# Patient Record
Sex: Female | Born: 1960
Health system: Southern US, Community
[De-identification: ages and names within clinical notes are randomized; demographics above are authoritative.]

## PROBLEM LIST (undated history)

## (undated) ENCOUNTER — Ambulatory Visit: Admission: EM | Payer: Medicare HMO

## (undated) DIAGNOSIS — I10 Essential (primary) hypertension: Secondary | ICD-10-CM

## (undated) DIAGNOSIS — E119 Type 2 diabetes mellitus without complications: Secondary | ICD-10-CM

## (undated) DIAGNOSIS — G20A1 Parkinson's disease without dyskinesia, without mention of fluctuations: Secondary | ICD-10-CM

## (undated) DIAGNOSIS — F4310 Post-traumatic stress disorder, unspecified: Secondary | ICD-10-CM

## (undated) DIAGNOSIS — M51369 Other intervertebral disc degeneration, lumbar region without mention of lumbar back pain or lower extremity pain: Secondary | ICD-10-CM

## (undated) DIAGNOSIS — D170 Benign lipomatous neoplasm of skin and subcutaneous tissue of head, face and neck: Secondary | ICD-10-CM

## (undated) DIAGNOSIS — G4733 Obstructive sleep apnea (adult) (pediatric): Secondary | ICD-10-CM

## (undated) DIAGNOSIS — F32A Depression, unspecified: Secondary | ICD-10-CM

## (undated) DIAGNOSIS — M791 Myalgia, unspecified site: Secondary | ICD-10-CM

## (undated) DIAGNOSIS — F419 Anxiety disorder, unspecified: Secondary | ICD-10-CM

## (undated) DIAGNOSIS — J189 Pneumonia, unspecified organism: Secondary | ICD-10-CM

## (undated) DIAGNOSIS — E669 Obesity, unspecified: Secondary | ICD-10-CM

## (undated) DIAGNOSIS — I639 Cerebral infarction, unspecified: Secondary | ICD-10-CM

## (undated) DIAGNOSIS — Z8489 Family history of other specified conditions: Secondary | ICD-10-CM

## (undated) DIAGNOSIS — F329 Major depressive disorder, single episode, unspecified: Secondary | ICD-10-CM

## (undated) DIAGNOSIS — M1990 Unspecified osteoarthritis, unspecified site: Secondary | ICD-10-CM

## (undated) DIAGNOSIS — G20 Parkinson's disease: Secondary | ICD-10-CM

## (undated) DIAGNOSIS — G4752 REM sleep behavior disorder: Secondary | ICD-10-CM

## (undated) DIAGNOSIS — S069X1A Unspecified intracranial injury with loss of consciousness of 30 minutes or less, initial encounter: Secondary | ICD-10-CM

## (undated) DIAGNOSIS — S069X9A Unspecified intracranial injury with loss of consciousness of unspecified duration, initial encounter: Secondary | ICD-10-CM

## (undated) DIAGNOSIS — E785 Hyperlipidemia, unspecified: Secondary | ICD-10-CM

## (undated) DIAGNOSIS — K760 Fatty (change of) liver, not elsewhere classified: Secondary | ICD-10-CM

## (undated) DIAGNOSIS — G5793 Unspecified mononeuropathy of bilateral lower limbs: Secondary | ICD-10-CM

## (undated) DIAGNOSIS — A0472 Enterocolitis due to Clostridium difficile, not specified as recurrent: Secondary | ICD-10-CM

## (undated) DIAGNOSIS — I700 Atherosclerosis of aorta: Secondary | ICD-10-CM

## (undated) DIAGNOSIS — Z720 Tobacco use: Secondary | ICD-10-CM

## (undated) DIAGNOSIS — Z794 Long term (current) use of insulin: Secondary | ICD-10-CM

## (undated) DIAGNOSIS — J45909 Unspecified asthma, uncomplicated: Secondary | ICD-10-CM

## (undated) DIAGNOSIS — J302 Other seasonal allergic rhinitis: Secondary | ICD-10-CM

## (undated) DIAGNOSIS — R569 Unspecified convulsions: Secondary | ICD-10-CM

## (undated) DIAGNOSIS — F319 Bipolar disorder, unspecified: Secondary | ICD-10-CM

## (undated) DIAGNOSIS — Z7982 Long term (current) use of aspirin: Secondary | ICD-10-CM

## (undated) DIAGNOSIS — I2510 Atherosclerotic heart disease of native coronary artery without angina pectoris: Secondary | ICD-10-CM

## (undated) DIAGNOSIS — G4700 Insomnia, unspecified: Secondary | ICD-10-CM

## (undated) DIAGNOSIS — M5136 Other intervertebral disc degeneration, lumbar region: Secondary | ICD-10-CM

## (undated) HISTORY — PX: BREAST BIOPSY: SHX20

## (undated) HISTORY — DX: Obesity, unspecified: E66.9

## (undated) HISTORY — DX: Myalgia, unspecified site: M79.10

## (undated) HISTORY — DX: Unspecified convulsions: R56.9

## (undated) HISTORY — DX: Parkinson's disease without dyskinesia, without mention of fluctuations: G20.A1

## (undated) HISTORY — DX: Enterocolitis due to Clostridium difficile, not specified as recurrent: A04.72

## (undated) HISTORY — PX: BACK SURGERY: SHX140

## (undated) HISTORY — DX: Hyperlipidemia, unspecified: E78.5

## (undated) HISTORY — DX: Other intervertebral disc degeneration, lumbar region: M51.36

## (undated) HISTORY — DX: Type 2 diabetes mellitus without complications: E11.9

## (undated) HISTORY — DX: Parkinson's disease: G20

## (undated) HISTORY — DX: Tobacco use: Z72.0

---

## 2003-12-08 DIAGNOSIS — G459 Transient cerebral ischemic attack, unspecified: Secondary | ICD-10-CM

## 2003-12-08 HISTORY — DX: Transient cerebral ischemic attack, unspecified: G45.9

## 2010-12-07 HISTORY — PX: ABDOMINAL HYSTERECTOMY: SHX81

## 2011-01-02 DIAGNOSIS — K859 Acute pancreatitis without necrosis or infection, unspecified: Secondary | ICD-10-CM | POA: Insufficient documentation

## 2011-07-02 DIAGNOSIS — R300 Dysuria: Secondary | ICD-10-CM | POA: Insufficient documentation

## 2011-12-08 HISTORY — PX: CHOLECYSTECTOMY: SHX55

## 2013-09-05 ENCOUNTER — Emergency Department: Payer: Self-pay | Admitting: Emergency Medicine

## 2013-09-05 LAB — URINALYSIS, COMPLETE
Bacteria: NONE SEEN
Glucose,UR: NEGATIVE mg/dL (ref 0–75)
Ketone: NEGATIVE
Leukocyte Esterase: NEGATIVE
Nitrite: NEGATIVE
Ph: 6 (ref 4.5–8.0)
RBC,UR: <1 /HPF (ref 0–5)
Specific Gravity: 1.01 (ref 1.003–1.030)
WBC UR: NONE SEEN /HPF (ref 0–5)

## 2013-09-05 LAB — BASIC METABOLIC PANEL
BUN: 10 mg/dL (ref 7–18)
Creatinine: 0.79 mg/dL (ref 0.60–1.30)
EGFR (African American): >60
EGFR (Non-African Amer.): >60
Glucose: 106 mg/dL — ABNORMAL HIGH (ref 65–99)
Potassium: 4 mmol/L (ref 3.5–5.1)

## 2013-09-05 LAB — CBC
HCT: 41.4 % (ref 35.0–47.0)
HGB: 14.4 g/dL (ref 12.0–16.0)
MCHC: 34.8 g/dL (ref 32.0–36.0)
MCV: 93 fL (ref 80–100)
RBC: 4.45 10*6/uL (ref 3.80–5.20)
WBC: 7.9 10*3/uL (ref 3.6–11.0)

## 2013-09-05 LAB — PRO B NATRIURETIC PEPTIDE: B-Type Natriuretic Peptide: 104 pg/mL (ref 0–125)

## 2013-09-06 ENCOUNTER — Emergency Department: Payer: Self-pay | Admitting: Emergency Medicine

## 2013-09-06 LAB — CBC WITH DIFFERENTIAL/PLATELET
Eosinophil #: 0.4 10*3/uL (ref 0.0–0.7)
HCT: 40.8 % (ref 35.0–47.0)
Lymphocyte #: 3.7 10*3/uL — ABNORMAL HIGH (ref 1.0–3.6)
Lymphocyte %: 33.4 %
MCH: 32.7 pg (ref 26.0–34.0)
MCHC: 35.4 g/dL (ref 32.0–36.0)
MCV: 93 fL (ref 80–100)
Monocyte #: 0.6 x10 3/mm (ref 0.2–0.9)
Monocyte %: 5.8 %
Neutrophil #: 6.3 10*3/uL (ref 1.4–6.5)
Platelet: 254 10*3/uL (ref 150–440)
RBC: 4.41 10*6/uL (ref 3.80–5.20)
RDW: 13 % (ref 11.5–14.5)

## 2013-09-06 LAB — COMPREHENSIVE METABOLIC PANEL
Albumin: 3.6 g/dL (ref 3.4–5.0)
Anion Gap: 7 (ref 7–16)
BUN: 14 mg/dL (ref 7–18)
Bilirubin,Total: 0.3 mg/dL (ref 0.2–1.0)
Co2: 26 mmol/L (ref 21–32)
Creatinine: 0.89 mg/dL (ref 0.60–1.30)
EGFR (African American): >60
EGFR (Non-African Amer.): >60
Osmolality: 283 (ref 275–301)
Potassium: 3.8 mmol/L (ref 3.5–5.1)
SGPT (ALT): 29 U/L (ref 12–78)
Total Protein: 6.6 g/dL (ref 6.4–8.2)

## 2013-09-07 LAB — TROPONIN I: Troponin-I: <0.02 ng/mL

## 2013-11-13 ENCOUNTER — Emergency Department: Payer: Self-pay | Admitting: Emergency Medicine

## 2013-11-13 LAB — URINALYSIS, COMPLETE
Bilirubin,UR: NEGATIVE
Blood: NEGATIVE
Ketone: NEGATIVE
Nitrite: NEGATIVE
Ph: 6 (ref 4.5–8.0)
RBC,UR: <1 /HPF (ref 0–5)
Specific Gravity: 1.021 (ref 1.003–1.030)
Squamous Epithelial: 1
WBC UR: 4 /HPF (ref 0–5)

## 2013-11-13 LAB — CBC WITH DIFFERENTIAL/PLATELET
Basophil %: 1.2 %
Eosinophil #: 0.2 10*3/uL (ref 0.0–0.7)
HCT: 44 % (ref 35.0–47.0)
HGB: 14.9 g/dL (ref 12.0–16.0)
Lymphocyte %: 26.5 %
MCH: 31.7 pg (ref 26.0–34.0)
MCHC: 33.9 g/dL (ref 32.0–36.0)
MCV: 93 fL (ref 80–100)
Monocyte %: 6.3 %
Neutrophil %: 64.8 %
Platelet: 290 10*3/uL (ref 150–440)
RBC: 4.71 10*6/uL (ref 3.80–5.20)

## 2013-11-13 LAB — BASIC METABOLIC PANEL
BUN: 17 mg/dL (ref 7–18)
Chloride: 99 mmol/L (ref 98–107)
Co2: 26 mmol/L (ref 21–32)
Creatinine: 1.3 mg/dL (ref 0.60–1.30)
EGFR (Non-African Amer.): 47 — ABNORMAL LOW

## 2013-11-13 LAB — TROPONIN I: Troponin-I: <0.02 ng/mL

## 2014-03-16 ENCOUNTER — Emergency Department: Payer: Self-pay | Admitting: Emergency Medicine

## 2014-03-16 LAB — BASIC METABOLIC PANEL
Anion Gap: 10 (ref 7–16)
BUN: 16 mg/dL (ref 7–18)
CALCIUM: 8.6 mg/dL (ref 8.5–10.1)
CREATININE: 0.72 mg/dL (ref 0.60–1.30)
Chloride: 104 mmol/L (ref 98–107)
Co2: 21 mmol/L (ref 21–32)
Glucose: 241 mg/dL — ABNORMAL HIGH (ref 65–99)
Osmolality: 279 (ref 275–301)
Potassium: 3.7 mmol/L (ref 3.5–5.1)
SODIUM: 135 mmol/L — AB (ref 136–145)

## 2014-03-16 LAB — URINALYSIS, COMPLETE
BACTERIA: NONE SEEN
BILIRUBIN, UR: NEGATIVE
BLOOD: NEGATIVE
Hyaline Cast: 10
Leukocyte Esterase: NEGATIVE
NITRITE: NEGATIVE
Ph: 5 (ref 4.5–8.0)
Protein: 30
SPECIFIC GRAVITY: 1.028 (ref 1.003–1.030)
WBC UR: 2 /HPF (ref 0–5)

## 2014-03-16 LAB — CBC
HCT: 42.8 % (ref 35.0–47.0)
HGB: 14.4 g/dL (ref 12.0–16.0)
MCH: 32.1 pg (ref 26.0–34.0)
MCHC: 33.7 g/dL (ref 32.0–36.0)
MCV: 95 fL (ref 80–100)
Platelet: 270 10*3/uL (ref 150–440)
RBC: 4.5 10*6/uL (ref 3.80–5.20)
RDW: 13.4 % (ref 11.5–14.5)
WBC: 13.4 10*3/uL — AB (ref 3.6–11.0)

## 2014-04-18 DIAGNOSIS — B370 Candidal stomatitis: Secondary | ICD-10-CM | POA: Diagnosis not present

## 2014-04-18 DIAGNOSIS — B373 Candidiasis of vulva and vagina: Secondary | ICD-10-CM | POA: Diagnosis not present

## 2014-04-18 DIAGNOSIS — B3731 Acute candidiasis of vulva and vagina: Secondary | ICD-10-CM | POA: Diagnosis not present

## 2014-04-18 DIAGNOSIS — E119 Type 2 diabetes mellitus without complications: Secondary | ICD-10-CM | POA: Diagnosis not present

## 2014-06-01 LAB — LIPID PANEL
Cholesterol: 208 mg/dL — AB (ref 0–200)
HDL: 40 mg/dL (ref 35–70)
Triglycerides: 172 mg/dL — AB (ref 40–160)

## 2014-07-13 ENCOUNTER — Emergency Department: Payer: Self-pay | Admitting: Emergency Medicine

## 2014-07-13 LAB — COMPREHENSIVE METABOLIC PANEL
AST: 21 U/L (ref 15–37)
Albumin: 3.9 g/dL (ref 3.4–5.0)
Alkaline Phosphatase: 117 U/L — ABNORMAL HIGH
Anion Gap: 11 (ref 7–16)
BUN: 12 mg/dL (ref 7–18)
Bilirubin,Total: 0.4 mg/dL (ref 0.2–1.0)
CALCIUM: 8.4 mg/dL — AB (ref 8.5–10.1)
CO2: 22 mmol/L (ref 21–32)
Chloride: 107 mmol/L (ref 98–107)
Creatinine: 0.9 mg/dL (ref 0.60–1.30)
EGFR (African American): >60
EGFR (Non-African Amer.): >60
Glucose: 243 mg/dL — ABNORMAL HIGH (ref 65–99)
OSMOLALITY: 287 (ref 275–301)
Potassium: 3.6 mmol/L (ref 3.5–5.1)
SGPT (ALT): 27 U/L
SODIUM: 140 mmol/L (ref 136–145)
Total Protein: 7.2 g/dL (ref 6.4–8.2)

## 2014-07-13 LAB — URINALYSIS, COMPLETE
BACTERIA: NONE SEEN
Bilirubin,UR: NEGATIVE
Blood: NEGATIVE
Glucose,UR: >=500 mg/dL (ref 0–75)
LEUKOCYTE ESTERASE: NEGATIVE
NITRITE: NEGATIVE
Ph: 6 (ref 4.5–8.0)
Protein: NEGATIVE
RBC,UR: 3 /HPF (ref 0–5)
SPECIFIC GRAVITY: 1.018 (ref 1.003–1.030)
Squamous Epithelial: 3
WBC UR: 1 /HPF (ref 0–5)

## 2014-07-13 LAB — CBC WITH DIFFERENTIAL/PLATELET
Basophil #: 0 10*3/uL (ref 0.0–0.1)
Basophil %: 0.2 %
Eosinophil #: 0.2 10*3/uL (ref 0.0–0.7)
Eosinophil %: 1.8 %
HCT: 46.9 % (ref 35.0–47.0)
HGB: 15.5 g/dL (ref 12.0–16.0)
LYMPHS PCT: 25.7 %
Lymphocyte #: 3.2 10*3/uL (ref 1.0–3.6)
MCH: 31.9 pg (ref 26.0–34.0)
MCHC: 33.2 g/dL (ref 32.0–36.0)
MCV: 96 fL (ref 80–100)
Monocyte #: 0.8 x10 3/mm (ref 0.2–0.9)
Monocyte %: 6.7 %
Neutrophil #: 8.2 10*3/uL — ABNORMAL HIGH (ref 1.4–6.5)
Neutrophil %: 65.6 %
PLATELETS: 272 10*3/uL (ref 150–440)
RBC: 4.88 10*6/uL (ref 3.80–5.20)
RDW: 12.9 % (ref 11.5–14.5)
WBC: 12.5 10*3/uL — ABNORMAL HIGH (ref 3.6–11.0)

## 2014-07-13 LAB — LIPASE, BLOOD: Lipase: 124 U/L (ref 73–393)

## 2014-07-13 LAB — TROPONIN I

## 2014-08-24 DIAGNOSIS — J42 Unspecified chronic bronchitis: Secondary | ICD-10-CM | POA: Diagnosis not present

## 2014-08-24 DIAGNOSIS — R112 Nausea with vomiting, unspecified: Secondary | ICD-10-CM | POA: Diagnosis not present

## 2014-09-08 DIAGNOSIS — J209 Acute bronchitis, unspecified: Secondary | ICD-10-CM | POA: Diagnosis not present

## 2015-01-02 DIAGNOSIS — F349 Persistent mood [affective] disorder, unspecified: Secondary | ICD-10-CM | POA: Diagnosis not present

## 2015-01-02 DIAGNOSIS — F411 Generalized anxiety disorder: Secondary | ICD-10-CM | POA: Diagnosis not present

## 2015-01-22 ENCOUNTER — Emergency Department: Payer: Self-pay | Admitting: Emergency Medicine

## 2015-01-29 LAB — HEPATIC FUNCTION PANEL
ALT: 25 U/L (ref 7–35)
AST: 15 U/L (ref 13–35)
Alkaline Phosphatase: 82 U/L (ref 25–125)

## 2015-01-29 LAB — HEMOGLOBIN A1C: HEMOGLOBIN A1C: 11.4 % — AB (ref 4.0–6.0)

## 2015-03-22 ENCOUNTER — Ambulatory Visit
Admit: 2015-03-22 | Disposition: A | Discharge status: Home / Self Care | Payer: Self-pay | Attending: Family Medicine | Admitting: Family Medicine

## 2015-03-22 LAB — BASIC METABOLIC PANEL
BUN: 16 mg/dL (ref 4–21)
Creatinine: 0.8 mg/dL (ref 0.5–1.1)
Glucose: 309 mg/dL
Sodium: 136 mmol/L — AB (ref 137–147)

## 2015-03-22 LAB — LIPID PANEL: LDL Cholesterol: 134 mg/dL

## 2015-04-02 ENCOUNTER — Emergency Department: Admit: 2015-04-02 | Disposition: A | Discharge status: Home / Self Care | Payer: Self-pay | Admitting: Internal Medicine

## 2015-04-02 DIAGNOSIS — R61 Generalized hyperhidrosis: Secondary | ICD-10-CM | POA: Diagnosis not present

## 2015-04-02 DIAGNOSIS — E1169 Type 2 diabetes mellitus with other specified complication: Secondary | ICD-10-CM | POA: Diagnosis not present

## 2015-04-02 LAB — CBC WITH DIFFERENTIAL/PLATELET
BASOS PCT: 0.8 %
Basophil #: 0.1 10*3/uL (ref 0.0–0.1)
EOS PCT: 1.7 %
Eosinophil #: 0.2 10*3/uL (ref 0.0–0.7)
HCT: 40.9 % (ref 35.0–47.0)
HGB: 14.4 g/dL (ref 12.0–16.0)
Lymphocyte #: 3.3 10*3/uL (ref 1.0–3.6)
Lymphocyte %: 34.2 %
MCH: 32.8 pg (ref 26.0–34.0)
MCHC: 35.1 g/dL (ref 32.0–36.0)
MCV: 94 fL (ref 80–100)
MONO ABS: 0.6 x10 3/mm (ref 0.2–0.9)
MONOS PCT: 6.4 %
NEUTROS PCT: 56.9 %
Neutrophil #: 5.5 10*3/uL (ref 1.4–6.5)
Platelet: 250 10*3/uL (ref 150–440)
RBC: 4.38 10*6/uL (ref 3.80–5.20)
RDW: 12.7 % (ref 11.5–14.5)
WBC: 9.6 10*3/uL (ref 3.6–11.0)

## 2015-04-02 LAB — BASIC METABOLIC PANEL
Anion Gap: 7 (ref 7–16)
BUN: 17 mg/dL
Calcium, Total: 9.1 mg/dL
Chloride: 103 mmol/L
Co2: 26 mmol/L
Creatinine: 1.01 mg/dL — ABNORMAL HIGH
EGFR (African American): >60
EGFR (Non-African Amer.): >60
Glucose: 431 mg/dL — ABNORMAL HIGH
Potassium: 3.9 mmol/L
SODIUM: 136 mmol/L

## 2015-04-02 LAB — HEPATIC FUNCTION PANEL A (ARMC)
ALBUMIN: 3.8 g/dL
Alkaline Phosphatase: 84 U/L
Bilirubin, Direct: <0.1 mg/dL
Bilirubin,Total: 0.4 mg/dL
SGOT(AST): 20 U/L
SGPT (ALT): 19 U/L
Total Protein: 6.3 g/dL — ABNORMAL LOW

## 2015-04-02 LAB — URINALYSIS, COMPLETE
BACTERIA: NONE SEEN
BLOOD: NEGATIVE
Bilirubin,UR: NEGATIVE
Leukocyte Esterase: NEGATIVE
Nitrite: NEGATIVE
Ph: 6 (ref 4.5–8.0)
Protein: NEGATIVE
Specific Gravity: 1.031 (ref 1.003–1.030)

## 2015-04-02 LAB — TROPONIN I: Troponin-I: <0.03 ng/mL

## 2015-04-02 LAB — LIPASE, BLOOD: LIPASE: 26 U/L

## 2015-04-04 ENCOUNTER — Emergency Department
Admit: 2015-04-04 | Disposition: A | Discharge status: Home / Self Care | Payer: Self-pay | Admitting: Emergency Medicine

## 2015-05-23 ENCOUNTER — Other Ambulatory Visit: Payer: Self-pay | Admitting: Family Medicine

## 2015-05-23 DIAGNOSIS — I10 Essential (primary) hypertension: Secondary | ICD-10-CM

## 2015-05-29 ENCOUNTER — Encounter: Payer: Self-pay | Admitting: Occupational Medicine

## 2015-05-29 ENCOUNTER — Emergency Department
Admission: EM | Admit: 2015-05-29 | Discharge: 2015-05-29 | Disposition: A | Discharge status: Home / Self Care | Payer: Commercial Managed Care - HMO | Attending: Emergency Medicine | Admitting: Emergency Medicine

## 2015-05-29 ENCOUNTER — Emergency Department: Payer: Commercial Managed Care - HMO

## 2015-05-29 DIAGNOSIS — I10 Essential (primary) hypertension: Secondary | ICD-10-CM | POA: Insufficient documentation

## 2015-05-29 DIAGNOSIS — Z79899 Other long term (current) drug therapy: Secondary | ICD-10-CM | POA: Insufficient documentation

## 2015-05-29 DIAGNOSIS — M545 Low back pain: Secondary | ICD-10-CM | POA: Diagnosis present

## 2015-05-29 DIAGNOSIS — Z7982 Long term (current) use of aspirin: Secondary | ICD-10-CM | POA: Diagnosis not present

## 2015-05-29 DIAGNOSIS — M5442 Lumbago with sciatica, left side: Secondary | ICD-10-CM | POA: Diagnosis not present

## 2015-05-29 DIAGNOSIS — R109 Unspecified abdominal pain: Secondary | ICD-10-CM

## 2015-05-29 DIAGNOSIS — Z794 Long term (current) use of insulin: Secondary | ICD-10-CM | POA: Diagnosis not present

## 2015-05-29 DIAGNOSIS — E119 Type 2 diabetes mellitus without complications: Secondary | ICD-10-CM | POA: Insufficient documentation

## 2015-05-29 HISTORY — DX: Depression, unspecified: F32.A

## 2015-05-29 HISTORY — DX: Major depressive disorder, single episode, unspecified: F32.9

## 2015-05-29 HISTORY — DX: Essential (primary) hypertension: I10

## 2015-05-29 HISTORY — DX: Type 2 diabetes mellitus without complications: E11.9

## 2015-05-29 HISTORY — DX: Insomnia, unspecified: G47.00

## 2015-05-29 HISTORY — DX: Anxiety disorder, unspecified: F41.9

## 2015-05-29 LAB — BASIC METABOLIC PANEL
Anion gap: 8 (ref 5–15)
BUN: 19 mg/dL (ref 6–20)
CO2: 26 mmol/L (ref 22–32)
CREATININE: 0.72 mg/dL (ref 0.44–1.00)
Calcium: 9.2 mg/dL (ref 8.9–10.3)
Chloride: 104 mmol/L (ref 101–111)
GFR calc Af Amer: >60 mL/min (ref >60)
GFR calc non Af Amer: >60 mL/min (ref >60)
GLUCOSE: 253 mg/dL — AB (ref 65–99)
POTASSIUM: 4.1 mmol/L (ref 3.5–5.1)
Sodium: 138 mmol/L (ref 135–145)

## 2015-05-29 LAB — URINALYSIS COMPLETE WITH MICROSCOPIC (ARMC ONLY)
Bilirubin Urine: NEGATIVE
Glucose, UA: >500 mg/dL — AB
Hgb urine dipstick: NEGATIVE
Ketones, ur: NEGATIVE mg/dL
Nitrite: NEGATIVE
Protein, ur: 30 mg/dL — AB
RBC / HPF: NONE SEEN RBC/hpf (ref 0–5)
Specific Gravity, Urine: 1.028 (ref 1.005–1.030)
WBC, UA: NONE SEEN WBC/hpf (ref 0–5)
pH: 5 (ref 5.0–8.0)

## 2015-05-29 LAB — CK: Total CK: 47 U/L (ref 38–234)

## 2015-05-29 MED ORDER — DIAZEPAM 2 MG PO TABS
ORAL_TABLET | ORAL | Status: AC
  Administered 2015-05-29: 2 mg via ORAL
  Filled 2015-05-29: qty 1

## 2015-05-29 MED ORDER — OXYCODONE-ACETAMINOPHEN 5-325 MG PO TABS
ORAL_TABLET | ORAL | Status: AC
  Administered 2015-05-29: 2 via ORAL
  Filled 2015-05-29: qty 2

## 2015-05-29 MED ORDER — OXYCODONE-ACETAMINOPHEN 5-325 MG PO TABS: 1.0000 | ORAL_TABLET | ORAL | Status: DC | PRN

## 2015-05-29 MED ORDER — KETOROLAC TROMETHAMINE 60 MG/2ML IM SOLN
INTRAMUSCULAR | Status: AC
  Administered 2015-05-29: 60 mg via INTRAMUSCULAR
  Filled 2015-05-29: qty 2

## 2015-05-29 MED ORDER — MORPHINE SULFATE 4 MG/ML IJ SOLN
4.0000 mg | Freq: Once | INTRAMUSCULAR | Status: AC
  Administered 2015-05-29: 4 mg via INTRAMUSCULAR

## 2015-05-29 MED ORDER — MORPHINE SULFATE 4 MG/ML IJ SOLN
INTRAMUSCULAR | Status: AC
  Administered 2015-05-29: 4 mg via INTRAMUSCULAR
  Filled 2015-05-29: qty 1

## 2015-05-29 MED ORDER — KETOROLAC TROMETHAMINE 30 MG/ML IJ SOLN: 60.0000 mg | Freq: Once | INTRAMUSCULAR | Status: DC

## 2015-05-29 MED ORDER — OXYCODONE-ACETAMINOPHEN 5-325 MG PO TABS
ORAL_TABLET | ORAL | Status: AC
  Filled 2015-05-29: qty 1

## 2015-05-29 MED ORDER — MORPHINE SULFATE 2 MG/ML IJ SOLN
INTRAMUSCULAR | Status: AC
  Administered 2015-05-29: 2 mg via INTRAMUSCULAR
  Filled 2015-05-29: qty 1

## 2015-05-29 MED ORDER — OXYCODONE-ACETAMINOPHEN 5-325 MG PO TABS
2.0000 | ORAL_TABLET | Freq: Once | ORAL | Status: AC
  Administered 2015-05-29: 2 via ORAL

## 2015-05-29 MED ORDER — FENTANYL CITRATE (PF) 100 MCG/2ML IJ SOLN
INTRAMUSCULAR | Status: AC
  Administered 2015-05-29: 100 ug via INTRAVENOUS
  Filled 2015-05-29: qty 2

## 2015-05-29 MED ORDER — KETOROLAC TROMETHAMINE 60 MG/2ML IM SOLN
60.0000 mg | Freq: Once | INTRAMUSCULAR | Status: AC
  Administered 2015-05-29: 60 mg via INTRAMUSCULAR

## 2015-05-29 MED ORDER — MORPHINE SULFATE 2 MG/ML IJ SOLN
2.0000 mg | Freq: Once | INTRAMUSCULAR | Status: AC
  Administered 2015-05-29: 2 mg via INTRAMUSCULAR

## 2015-05-29 MED ORDER — ONDANSETRON 4 MG PO TBDP
ORAL_TABLET | ORAL | Status: AC
  Administered 2015-05-29: 4 mg via ORAL
  Filled 2015-05-29: qty 1

## 2015-05-29 MED ORDER — DIAZEPAM 2 MG PO TABS
2.0000 mg | ORAL_TABLET | Freq: Once | ORAL | Status: AC
  Administered 2015-05-29: 2 mg via ORAL

## 2015-05-29 MED ORDER — FENTANYL CITRATE (PF) 100 MCG/2ML IJ SOLN
100.0000 ug | Freq: Once | INTRAMUSCULAR | Status: AC
  Administered 2015-05-29: 100 ug via INTRAVENOUS

## 2015-05-29 MED ORDER — DIAZEPAM 2 MG PO TABS: 2.0000 mg | ORAL_TABLET | Freq: Three times a day (TID) | ORAL | Status: DC | PRN

## 2015-05-29 MED ORDER — OXYCODONE-ACETAMINOPHEN 5-325 MG PO TABS: 1.0000 | ORAL_TABLET | Freq: Once | ORAL | Status: DC

## 2015-05-29 MED ORDER — DIAZEPAM 5 MG/ML IJ SOLN
INTRAMUSCULAR | Status: AC
  Administered 2015-05-29: 2 mg via INTRAVENOUS
  Filled 2015-05-29: qty 2

## 2015-05-29 MED ORDER — IBUPROFEN 800 MG PO TABS: 800.0000 mg | ORAL_TABLET | Freq: Three times a day (TID) | ORAL | Status: DC | PRN

## 2015-05-29 MED ORDER — DIAZEPAM 5 MG/ML IJ SOLN
2.0000 mg | Freq: Once | INTRAMUSCULAR | Status: AC
  Administered 2015-05-29: 2 mg via INTRAVENOUS

## 2015-05-29 MED ORDER — ONDANSETRON 4 MG PO TBDP
4.0000 mg | ORAL_TABLET | Freq: Once | ORAL | Status: AC
  Administered 2015-05-29: 4 mg via ORAL

## 2015-05-29 NOTE — Discharge Instructions (Signed)
1. Take medicines as needed for pain and muscle spasms (Motrin/Percocet/Valium #15). 2. Apply moist heat to affected area several times daily. 3. Return to the ER for worsening symptoms, persistent vomiting, losing control of your bowel or bladder, leg weakness or other concerns.  Back Pain, Adult Back pain is very common. The pain often gets better over time. The cause of back pain is usually not dangerous. Most people can learn to manage their back pain on their own.  HOME CARE   Stay active. Start with short walks on flat ground if you can. Try to walk farther each day.  Do not sit, drive, or stand in one place for more than 30 minutes. Do not stay in bed.  Do not avoid exercise or work. Activity can help your back heal faster.  Be careful when you bend or lift an object. Bend at your knees, keep the object close to you, and do not twist.  Sleep on a firm mattress. Lie on your side, and bend your knees. If you lie on your back, put a pillow under your knees.  Only take medicines as told by your doctor.  Put ice on the injured area.  Put ice in a plastic bag.  Place a towel between your skin and the bag.  Leave the ice on for 15-20 minutes, 03-04 times a day for the first 2 to 3 days. After that, you can switch between ice and heat packs.  Ask your doctor about back exercises or massage.  Avoid feeling anxious or stressed. Find good ways to deal with stress, such as exercise. GET HELP RIGHT AWAY IF:   Your pain does not go away with rest or medicine.  Your pain does not go away in 1 week.  You have new problems.  You do not feel well.  The pain spreads into your legs.  You cannot control when you poop (bowel movement) or pee (urinate).  Your arms or legs feel weak or lose feeling (numbness).  You feel sick to your stomach (nauseous) or throw up (vomit).  You have belly (abdominal) pain.  You feel like you may pass out (faint). MAKE SURE YOU:   Understand these  instructions.  Will watch your condition.  Will get help right away if you are not doing well or get worse. Document Released: 05/11/2008 Document Revised: 02/15/2012 Document Reviewed: 03/27/2014 [facility] Patient Information 2015 [place], [place]. This information is not intended to replace advice given to you by your health care provider. Make sure you discuss any questions you have with your health care provider.  Sciatica Sciatica is pain, weakness, numbness, or tingling along the path of the sciatic nerve. The nerve starts in the lower back and runs down the back of each leg. The nerve controls the muscles in the lower leg and in the back of the knee, while also providing sensation to the back of the thigh, lower leg, and the sole of your foot. Sciatica is a symptom of another medical condition. For instance, nerve damage or certain conditions, such as a herniated disk or bone spur on the spine, pinch or put pressure on the sciatic nerve. This causes the pain, weakness, or other sensations normally associated with sciatica. Generally, sciatica only affects one side of the body. CAUSES   Herniated or slipped disc.  Degenerative disk disease.  A pain disorder involving the narrow muscle in the buttocks (piriformis syndrome).  Pelvic injury or fracture.  Pregnancy.  Tumor (rare). SYMPTOMS  Symptoms  can vary from mild to very severe. The symptoms usually travel from the low back to the buttocks and down the back of the leg. Symptoms can include:  Mild tingling or dull aches in the lower back, leg, or hip.  Numbness in the back of the calf or sole of the foot.  Burning sensations in the lower back, leg, or hip.  Sharp pains in the lower back, leg, or hip.  Leg weakness.  Severe back pain inhibiting movement. These symptoms may get worse with coughing, sneezing, laughing, or prolonged sitting or standing. Also, being overweight may worsen symptoms. DIAGNOSIS  Your caregiver will  perform a physical exam to look for common symptoms of sciatica. He or she may ask you to do certain movements or activities that would trigger sciatic nerve pain. Other tests may be performed to find the cause of the sciatica. These may include:  Blood tests.  X-rays.  Imaging tests, such as an MRI or CT scan. TREATMENT  Treatment is directed at the cause of the sciatic pain. Sometimes, treatment is not necessary and the pain and discomfort goes away on its own. If treatment is needed, your caregiver may suggest:  Over-the-counter medicines to relieve pain.  Prescription medicines, such as anti-inflammatory medicine, muscle relaxants, or narcotics.  Applying heat or ice to the painful area.  Steroid injections to lessen pain, irritation, and inflammation around the nerve.  Reducing activity during periods of pain.  Exercising and stretching to strengthen your abdomen and improve flexibility of your spine. Your caregiver may suggest losing weight if the extra weight makes the back pain worse.  Physical therapy.  Surgery to eliminate what is pressing or pinching the nerve, such as a bone spur or part of a herniated disk. HOME CARE INSTRUCTIONS   Only take over-the-counter or prescription medicines for pain or discomfort as directed by your caregiver.  Apply ice to the affected area for 20 minutes, 3-4 times a day for the first 48-72 hours. Then try heat in the same way.  Exercise, stretch, or perform your usual activities if these do not aggravate your pain.  Attend physical therapy sessions as directed by your caregiver.  Keep all follow-up appointments as directed by your caregiver.  Do not wear high heels or shoes that do not provide proper support.  Check your mattress to see if it is too soft. A firm mattress may lessen your pain and discomfort. SEEK IMMEDIATE MEDICAL CARE IF:   You lose control of your bowel or bladder (incontinence).  You have increasing weakness in  the lower back, pelvis, buttocks, or legs.  You have redness or swelling of your back.  You have a burning sensation when you urinate.  You have pain that gets worse when you lie down or awakens you at night.  Your pain is worse than you have experienced in the past.  Your pain is lasting longer than 4 weeks.  You are suddenly losing weight without reason. MAKE SURE YOU:  Understand these instructions.  Will watch your condition.  Will get help right away if you are not doing well or get worse. Document Released: 11/17/2001 Document Revised: 05/24/2012 Document Reviewed: 04/03/2012 [facility] Patient Information 2015 [place], [place]. This information is not intended to replace advice given to you by your health care provider. Make sure you discuss any questions you have with your health care provider.

## 2015-05-29 NOTE — ED Notes (Addendum)
Pt is was in severe pain when trying to d/c at 0600. Grimacing hollering crying in pain. MD aware told to hold percocet. Give morphine 4mg  IM 0549 no help MD  Reassess pt to see what can be done she decide to put in IV, Blood, Xrays, and Valium IV 0643. Valium worked instantly to pt to calm down she is wow of it but states she is 9/10. MD aware. Left for xray and 0650.

## 2015-05-29 NOTE — ED Provider Notes (Signed)
[facility] Emergency Department Provider Note  ____________________________________________  Time seen: Approximately 4:14 AM  I have reviewed the triage vital signs and the nursing notes.   HISTORY  Chief Complaint Back Pain    HPI Debra [name] is a 54 y.o. female who presents with left lower back pain radiating down left leg which increased approximately 10 PM. 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 Patient did not fall or strike her back. Patient had bearable pain throughout the week which increased this evening. Patient complains of 10/10 left lower back pain which courses through her but talks and radiates down her left leg. Patient denies bowel or bladder incontinence, numbness, tingling or weakness. Patient has a history of herniated disks without back surgery.He makes the pain better or worse.   Past Medical History  Diagnosis Date  . Hypertension   . Insomnia   . Diabetes mellitus without complication   . Anxiety   . Depression     There are no active problems to display for this patient.   No past surgical history on file.  Current Outpatient Rx  Name  Route  Sig  Dispense  Refill  . amLODipine (NORVASC) 10 MG tablet   Oral   Take 1 tablet (10 mg total) by mouth daily.   90 tablet   0   . aspirin 81 MG tablet   Oral   Take 81 mg by mouth daily.         . calcium-vitamin D (OSCAL WITH D) 500-200 MG-UNIT per tablet   Oral   Take 1 tablet by mouth.         . cholecalciferol (VITAMIN D) 1000 UNITS tablet   Oral   Take 2,000 Units by mouth daily.         . folic acid (FOLVITE) 800 MCG tablet   Oral   Take 400 mcg by mouth daily.         [name] glipiZIDE (GLUCOTROL) 10 MG tablet   Oral   Take 10 mg by mouth daily before breakfast.         . insulin glargine (LANTUS) 100 UNIT/ML injection   Subcutaneous   Inject 25 Units into the skin at bedtime.         [name]  lisinopril (PRINIVIL,ZESTRIL) 10 MG tablet   Oral   Take 1 tablet (10 mg total) by mouth daily.   90 tablet   0   . PARoxetine (PAXIL) 40 MG tablet   Oral   Take 40 mg by mouth every morning.         . sitaGLIPtin (JANUVIA) 100 MG tablet   Oral   Take 100 mg by mouth daily.         . traZODone (DESYREL) 100 MG tablet   Oral   Take 100 mg by mouth at bedtime.         . diazepam (VALIUM) 2 MG tablet   Oral   Take 1 tablet (2 mg total) by mouth every 8 (eight) hours as needed for muscle spasms.   15 tablet   0   . ibuprofen (ADVIL,MOTRIN) 800 MG tablet   Oral   Take 1 tablet (800 mg total) by mouth every 8 (eight) hours as needed for moderate pain.   15 tablet   0   . oxyCODONE-acetaminophen (ROXICET) 5-325 MG per tablet   Oral   Take 1 tablet by  mouth every 4 (four) hours as needed for severe pain.   15 tablet   0     Allergies Benadryl; Cholesterol; Depakote er; Dilaudid; Neosporin; Singulair; Sulfa antibiotics; and Victoza  History reviewed. No pertinent family history.  Social History History  Substance Use Topics  . Smoking status: Never Smoker   . Smokeless tobacco: Not on file  . Alcohol Use: No    Review of Systems Constitutional: No fever/chills Eyes: No visual changes. ENT: No sore throat. Cardiovascular: Denies chest pain. Respiratory: Denies shortness of breath. Gastrointestinal: No abdominal pain.  No nausea, no vomiting.  No diarrhea.  No constipation. Genitourinary: Negative for dysuria. Musculoskeletal: Positive for back pain. Skin: Negative for rash. Neurological: Negative for headaches, focal weakness or numbness.  10-point ROS otherwise negative.  ____________________________________________   PHYSICAL EXAM:  VITAL SIGNS: ED Triage Vitals  Enc Vitals Group     BP 05/29/15 0330 134/108 mmHg     Pulse Rate 05/29/15 0330 81     Resp --      Temp 05/29/15 0330 98 F (36.7 C)     Temp src --      SpO2 05/29/15 0330 99 %      Weight 05/29/15 0330 213 lb (96.616 kg)     Height 05/29/15 0330  (1.651 m)     Head Cir --      Peak Flow --      Pain Score 05/29/15 0331 10     Pain Loc --      Pain Edu? --      Excl. in GC? --     Constitutional: Alert and oriented. Well appearing and in moderate acute distress. Eyes: Conjunctivae are normal. PERRL. EOMI. Head: Atraumatic. Nose: No congestion/rhinnorhea. Mouth/Throat: Mucous membranes are moist.  Oropharynx non-erythematous. Neck: No stridor. No cervical spine tenderness to palpation. Cardiovascular: Normal rate, regular rhythm. Grossly normal heart sounds.  Good peripheral circulation. Respiratory: Normal respiratory effort.  No retractions. Lungs CTAB. Gastrointestinal: Soft and nontender. No distention. No abdominal bruits. No CVA tenderness. Musculoskeletal: Left lower paraspinal muscle spasms with tenderness to palpation. Positive straight leg raise on the left at 30. No lower extremity tenderness nor edema.  No joint effusions. Neurologic:  Normal speech and language. No gross focal neurologic deficits are appreciated. Specifically 5/5 motor strength and sensation BLE. Speech is normal. No gait instability. Skin:  Skin is warm, dry and intact. No rash noted. Psychiatric: Mood and affect are normal. Speech and behavior are normal.  ____________________________________________   LABS (all labs ordered are listed, but only abnormal results are displayed)  Labs Reviewed  BASIC METABOLIC PANEL  CK   ____________________________________________  EKG  None ____________________________________________  RADIOLOGY  None ____________________________________________   PROCEDURES  Procedure(s) performed: None  Critical Care performed: No  ____________________________________________   INITIAL IMPRESSION / ASSESSMENT AND PLAN / ED COURSE  Pertinent labs & imaging results that were available during my care of the patient were reviewed  by me and considered in my medical decision making (see chart for details).  54 year old female with complaints of left lower back pain radiating through buttocks into left lower leg. Symptoms consistent with acute sciatica. Plan for NSAIDs, analgesia and muscle relaxer. Will reassess.  ----------------------------------------- 5:07 AM on 05/29/2015 -----------------------------------------  Patient is much improved, rates her pain 3/10. Discussed with patient and husband; will continue regimen of NSAIDs, analgesia and muscle relaxer with orthopedics follow-up. Strict return precautions given. Both verbalize understanding and agree with plan of care.  -----------------------------------------  6:15 AM on 05/29/2015 -----------------------------------------  Patient experienced more pain and muscle spasms at discharge. Discharge was held for additional dose of IM morphine. On recheck patient is tearful, holding left buttock complaining of severe muscle spasm. Discussed with patient and spouse - will check electrolytes, ck, xrays, will administer IV Valium for muscle spasms.  ----------------------------------------- 7:02 AM on 05/29/2015 -----------------------------------------  Patient seemed more relaxed after IV Valium. Labs and xrays pending. Care transferred to Dr. Almira Bar. ____________________________________________   FINAL CLINICAL IMPRESSION(S) / ED DIAGNOSES  Final diagnoses:  Left-sided low back pain with left-sided sciatica      Irean Hong, MD 05/29/15 [phone]

## 2015-05-29 NOTE — ED Provider Notes (Signed)
I was seemed care of this patient. She was initially seen by Dr. Dolores Frame. Please see her H&P for background, past medical history, and other details related to this visit.  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 She has been treated with morphine, Toradol, and Valium without much relief. She continues to be very uncomfortable, writhing in bed.  On my exam at 750 finds the patient on her right side holding her left buttock tox complaining of pain that radiates down through the buttocks. The patient has intact motor and sensation to the left leg and a good distal pulse in the left foot.  With this, we will image the patient to rule out both renal stone as well as to have a much finer look at her lumbar spine. I have ordered a CT scan to the abdomen and pelvis for this purpose and I have spoken with the CT technician to be sure that we have lumbar reconstructions.  I'm continuing to treat the patient's pain. Her records show she is allergic to Dilaudid. She has received morphine without problem. I will treat her with fentanyl currently.  Labs: Urine: Normal Metabolic panel: Normal except for elevated glucose at 253 CK: Normal   ----------------------------------------- 9:10 AM on 05/29/2015 -----------------------------------------  CT scan shows no renal stone. It shows some mild disc bulges. When I told this to the patient she reports she has had those for years. In results the CT scan is overall negative. On reassessment 10 minutes ago, the patient is much much more comfortable than she was earlier. She is pleasant and communicative.  We discussed the allergy to Dilaudid. She had had some odd reaction to it, but I highly doubt that this was an allergic reaction, especially given the fact that she received morphine and fentanyl today without difficulty.  The patient and I have discussed a  plan for ongoing treatment at home. I will treat her with Percocet now as he fentanyl is either wearing off now or already has. She is still comfortable despite that possibility.  I will observe her for another 45 minutes to an hour to be sure this severe pain does not return. If she is stable we will discharge her home at that time.  ----------------------------------------- 10:00 AM on 05/29/2015 -----------------------------------------  Patient remains comfortable. She is alert. She is able to move some without severe distress. She is comfortable going home this time. Dr. Dolores Frame wrote her a work note as well as prescriptions for Percocet, Valium, and ibuprofen.   Darien Ramus, MD 05/29/15 1000

## 2015-05-29 NOTE — ED Notes (Signed)
Pt to triage via w/c, appears very uncomfortable, grimacing, breathing heavily; c/o severe left lower back pain radiating down left leg with no accomp symptoms since 10pm; denies hx of same

## 2015-05-29 NOTE — ED Notes (Addendum)
Pt to room 18 appears very uncomfortable, grimacing, breathing heavily; c/o severe left lower back pain radiating down left leg with no accomp symptoms since 10 pm; denies hx of same. Pt states it feels like a worse leg cramp. Pt moving all over the bed hollering out. Pt has hx of the bulging disks but doesn't feel like this. Denies any numbness or incontinent. + pulses to arms legs and feet.

## 2015-06-03 ENCOUNTER — Encounter: Payer: Self-pay | Admitting: Family Medicine

## 2015-06-03 ENCOUNTER — Ambulatory Visit (INDEPENDENT_AMBULATORY_CARE_PROVIDER_SITE_OTHER): Payer: Commercial Managed Care - HMO | Admitting: Family Medicine

## 2015-06-03 VITALS — BP 110/70 | HR 80 | Temp 98.1°F | Ht 65.0 in | Wt 214.6 lb

## 2015-06-03 DIAGNOSIS — E668 Other obesity: Secondary | ICD-10-CM | POA: Insufficient documentation

## 2015-06-03 DIAGNOSIS — I8390 Asymptomatic varicose veins of unspecified lower extremity: Secondary | ICD-10-CM | POA: Insufficient documentation

## 2015-06-03 DIAGNOSIS — F319 Bipolar disorder, unspecified: Secondary | ICD-10-CM | POA: Insufficient documentation

## 2015-06-03 DIAGNOSIS — E669 Obesity, unspecified: Secondary | ICD-10-CM | POA: Insufficient documentation

## 2015-06-03 DIAGNOSIS — R1084 Generalized abdominal pain: Secondary | ICD-10-CM | POA: Insufficient documentation

## 2015-06-03 DIAGNOSIS — M5442 Lumbago with sciatica, left side: Secondary | ICD-10-CM

## 2015-06-03 DIAGNOSIS — M7989 Other specified soft tissue disorders: Secondary | ICD-10-CM | POA: Insufficient documentation

## 2015-06-03 DIAGNOSIS — F17200 Nicotine dependence, unspecified, uncomplicated: Secondary | ICD-10-CM | POA: Insufficient documentation

## 2015-06-03 DIAGNOSIS — B3731 Acute candidiasis of vulva and vagina: Secondary | ICD-10-CM | POA: Insufficient documentation

## 2015-06-03 DIAGNOSIS — G4700 Insomnia, unspecified: Secondary | ICD-10-CM | POA: Insufficient documentation

## 2015-06-03 DIAGNOSIS — B373 Candidiasis of vulva and vagina: Secondary | ICD-10-CM | POA: Insufficient documentation

## 2015-06-03 DIAGNOSIS — Z794 Long term (current) use of insulin: Secondary | ICD-10-CM

## 2015-06-03 DIAGNOSIS — I10 Essential (primary) hypertension: Secondary | ICD-10-CM | POA: Insufficient documentation

## 2015-06-03 DIAGNOSIS — R141 Gas pain: Secondary | ICD-10-CM | POA: Insufficient documentation

## 2015-06-03 DIAGNOSIS — E119 Type 2 diabetes mellitus without complications: Secondary | ICD-10-CM | POA: Insufficient documentation

## 2015-06-03 NOTE — Progress Notes (Signed)
Name: Debra [name]   MRN: [id number]    DOB: 01-Jun-1961   Date:06/03/2015       Progress Note  Subjective  Chief Complaint  Chief Complaint  Patient presents with  . Follow-up    ER visit on 05-29-15, bulging disk ans sciatica    Back Pain This is a new problem. The current episode started in the past 7 days. The problem occurs constantly. The pain is present in the lumbar spine and sacro-iliac. The quality of the pain is described as cramping and shooting. The pain radiates to the left knee, left thigh and left foot. The pain is at a severity of 8/10. The pain is severe. The symptoms are aggravated by position. Associated symptoms include leg pain and numbness (numbness in left foot.). Pertinent negatives include no bladder incontinence, bowel incontinence or fever. She has tried analgesics for the symptoms. The treatment provided moderate relief.      Past Medical History  Diagnosis Date  . Hypertension   . Insomnia   . Diabetes mellitus without complication   . Anxiety   . Depression   . Hyperlipidemia   . Obesity   . Tobacco abuse     Past Surgical History  Procedure Laterality Date  . Cholecystectomy  2013  . Abdominal hysterectomy  2012  . Cesarean section  1992    Family History  Problem Relation Age of Onset  . COPD Mother   . Bipolar disorder Father   . Bipolar disorder Brother   . Diabetes Mellitus II Father   . Diabetes Mellitus II Brother     History   Social History  . Marital Status: Married    Spouse Name: N/A  . Number of Children: N/A  . Years of Education: N/A   Occupational History  . Not on file.   Social History Main Topics  . Smoking status: Current Every Day Smoker  . Smokeless tobacco: Not on file  . Alcohol Use: 0.0 oz/week    0 Standard drinks or equivalent per week     Comment: occasional  . Drug Use: No  . Sexual Activity: Not on file   Other Topics Concern  . Not on file   Social History Narrative     Current  outpatient prescriptions:  .  amLODipine (NORVASC) 10 MG tablet, Take 1 tablet (10 mg total) by mouth daily., Disp: 90 tablet, Rfl: 0 .  aspirin EC 81 MG tablet, Take 81 mg by mouth daily., Disp: , Rfl:  .  calcium-vitamin D (OSCAL WITH D) 500-200 MG-UNIT per tablet, Take 1 tablet by mouth daily. , Disp: , Rfl:  .  cholecalciferol (VITAMIN D) 1000 UNITS tablet, Take 2,000 Units by mouth daily., Disp: , Rfl:  .  diazepam (VALIUM) 2 MG tablet, Take 1 tablet (2 mg total) by mouth every 8 (eight) hours as needed for muscle spasms., Disp: 15 tablet, Rfl: 0 .  folic acid (FOLVITE) 400 MCG tablet, Take 400 mcg by mouth daily., Disp: , Rfl:  .  glipiZIDE (GLUCOTROL) 10 MG tablet, Take 10 mg by mouth daily. , Disp: , Rfl:  .  ibuprofen (ADVIL,MOTRIN) 800 MG tablet, Take 1 tablet (800 mg total) by mouth every 8 (eight) hours as needed for moderate pain., Disp: 15 tablet, Rfl: 0 .  insulin glargine (LANTUS) 100 UNIT/ML injection, Inject 40 Units into the skin at bedtime. , Disp: , Rfl:  .  linagliptin (TRADJENTA) 5 MG TABS tablet, Take 10 mg by mouth daily., Disp: ,  Rfl:  .  lisinopril (PRINIVIL,ZESTRIL) 10 MG tablet, Take 1 tablet (10 mg total) by mouth daily., Disp: 90 tablet, Rfl: 0 .  oxyCODONE-acetaminophen (ROXICET) 5-325 MG per tablet, Take 1 tablet by mouth every 4 (four) hours as needed for severe pain., Disp: 15 tablet, Rfl: 0 .  PARoxetine (PAXIL) 40 MG tablet, Take 40 mg by mouth every morning., Disp: , Rfl:   Allergies  Allergen Reactions  . Benadryl [Diphenhydramine Hcl] Swelling    blistering  . Cholesterol Other (See Comments)    meds cause her to pass out  . Depakote Er [Divalproex Sodium Er] Other (See Comments)    seizures  . Dilaudid [Hydromorphone Hcl] Other (See Comments)    seizures  . Neosporin [Neomycin-Bacitracin Zn-Polymyx] Swelling    blistering  . Singulair [Montelukast Sodium] Other (See Comments)    Asthma attacks  . Statins Other (See Comments)  . Sulfa Antibiotics  Hives  . Victoza [Liraglutide] Other (See Comments)    pancreatitis     Review of Systems  Constitutional: Negative for fever.  Gastrointestinal: Negative for bowel incontinence.  Genitourinary: Negative for bladder incontinence.  Musculoskeletal: Positive for back pain.  Neurological: Positive for numbness (numbness in left foot.).      Objective  Filed Vitals:   06/03/15 1201  BP: 110/70  Pulse: 80  Temp: 98.1 F (36.7 C)  TempSrc: Oral  Height:  (1.651 m)  Weight: 214 lb 9.6 oz (97.342 kg)    Physical Exam  Constitutional: She is oriented to person, place, and time.  Cardiovascular:  Pulses:      Dorsalis pedis pulses are 2+ on the left side.  Musculoskeletal:       Lumbar back: She exhibits decreased range of motion, tenderness, pain and spasm.       Back:  Neurological: She is alert and oriented to person, place, and time. She has normal reflexes. A sensory deficit (decreased sensation on left foot.) is present.  Reflex Scores:      Patellar reflexes are 2+ on the right side and 2+ on the left side.    Assessment & Plan 1. Left-sided low back pain with left-sided sciatica Patient has been referred to neurosurgery for evaluation of left-sided sciatica pain. CT scan from the Emergency Department was reviewed. Patient is on high-dose NSAID and opioids for relief of pain and has no red flags present.an out of work note was provided at this visit. - Ambulatory referral to Neurosurgery  There are no diagnoses linked to this encounter.  Debra [name] [name] 06/03/2015 12:19 PM

## 2015-06-06 ENCOUNTER — Telehealth: Payer: Self-pay | Admitting: Family Medicine

## 2015-06-06 NOTE — Telephone Encounter (Signed)
Pt [employer] insurance card is now fixed it now has dr Sherryll Burgershah name on the back of it. However it will not be effective until June 07, 2015. She has a pending request for a referral to a neurologist. Please return patient call [phone]

## 2015-06-10 ENCOUNTER — Emergency Department
Admission: EM | Admit: 2015-06-10 | Discharge: 2015-06-11 | Disposition: A | Discharge status: Home / Self Care | Payer: Commercial Managed Care - HMO | Attending: Emergency Medicine | Admitting: Emergency Medicine

## 2015-06-10 ENCOUNTER — Encounter: Payer: Self-pay | Admitting: *Deleted

## 2015-06-10 DIAGNOSIS — M5432 Sciatica, left side: Secondary | ICD-10-CM

## 2015-06-10 DIAGNOSIS — Z79899 Other long term (current) drug therapy: Secondary | ICD-10-CM | POA: Insufficient documentation

## 2015-06-10 DIAGNOSIS — E1165 Type 2 diabetes mellitus with hyperglycemia: Secondary | ICD-10-CM | POA: Diagnosis not present

## 2015-06-10 DIAGNOSIS — M549 Dorsalgia, unspecified: Secondary | ICD-10-CM | POA: Diagnosis present

## 2015-06-10 DIAGNOSIS — R739 Hyperglycemia, unspecified: Secondary | ICD-10-CM

## 2015-06-10 DIAGNOSIS — I10 Essential (primary) hypertension: Secondary | ICD-10-CM | POA: Diagnosis not present

## 2015-06-10 DIAGNOSIS — Z720 Tobacco use: Secondary | ICD-10-CM | POA: Insufficient documentation

## 2015-06-10 DIAGNOSIS — Z7982 Long term (current) use of aspirin: Secondary | ICD-10-CM | POA: Insufficient documentation

## 2015-06-10 DIAGNOSIS — R300 Dysuria: Secondary | ICD-10-CM

## 2015-06-10 NOTE — ED Notes (Signed)
Pt unable to void at this time. 

## 2015-06-10 NOTE — ED Notes (Signed)
Pt has had recent falls and dx with sciatica.  Pt fell again yesterday and has low back pain..  Pt also reports dysuria.

## 2015-06-11 LAB — URINALYSIS COMPLETE WITH MICROSCOPIC (ARMC ONLY)
BILIRUBIN URINE: NEGATIVE
Glucose, UA: >500 mg/dL — AB
Hgb urine dipstick: NEGATIVE
KETONES UR: NEGATIVE mg/dL
Leukocytes, UA: NEGATIVE
Nitrite: NEGATIVE
PH: 5 (ref 5.0–8.0)
PROTEIN: NEGATIVE mg/dL
SPECIFIC GRAVITY, URINE: 1.028 (ref 1.005–1.030)
SQUAMOUS EPITHELIAL / LPF: NONE SEEN

## 2015-06-11 LAB — GLUCOSE, CAPILLARY
Glucose-Capillary: 264 mg/dL — ABNORMAL HIGH (ref 65–99)
Glucose-Capillary: 443 mg/dL — ABNORMAL HIGH (ref 65–99)

## 2015-06-11 MED ORDER — CYCLOBENZAPRINE HCL 10 MG PO TABS
ORAL_TABLET | ORAL | Status: AC
  Administered 2015-06-11: 10 mg via ORAL
  Filled 2015-06-11: qty 1

## 2015-06-11 MED ORDER — INSULIN ASPART 100 UNIT/ML ~~LOC~~ SOLN
SUBCUTANEOUS | Status: AC
  Administered 2015-06-11: 10 [IU] via SUBCUTANEOUS
  Filled 2015-06-11: qty 10

## 2015-06-11 MED ORDER — IBUPROFEN 600 MG PO TABS
ORAL_TABLET | ORAL | Status: AC
  Administered 2015-06-11: 800 mg via ORAL
  Filled 2015-06-11: qty 1

## 2015-06-11 MED ORDER — IBUPROFEN 800 MG PO TABS
800.0000 mg | ORAL_TABLET | Freq: Once | ORAL | Status: AC
  Administered 2015-06-11: 800 mg via ORAL

## 2015-06-11 MED ORDER — NITROFURANTOIN MONOHYD MACRO 100 MG PO CAPS
ORAL_CAPSULE | ORAL | Status: AC
  Administered 2015-06-11: 100 mg via ORAL
  Filled 2015-06-11: qty 1

## 2015-06-11 MED ORDER — INSULIN ASPART 100 UNIT/ML ~~LOC~~ SOLN
10.0000 [IU] | Freq: Once | SUBCUTANEOUS | Status: AC
  Administered 2015-06-11: 10 [IU] via SUBCUTANEOUS

## 2015-06-11 MED ORDER — CYCLOBENZAPRINE HCL 10 MG PO TABS
10.0000 mg | ORAL_TABLET | Freq: Once | ORAL | Status: AC
  Administered 2015-06-11: 10 mg via ORAL

## 2015-06-11 MED ORDER — NITROFURANTOIN MONOHYD MACRO 100 MG PO CAPS
100.0000 mg | ORAL_CAPSULE | Freq: Once | ORAL | Status: AC
  Administered 2015-06-11: 100 mg via ORAL

## 2015-06-11 MED ORDER — NITROFURANTOIN MONOHYD MACRO 100 MG PO CAPS: 100.0000 mg | ORAL_CAPSULE | Freq: Two times a day (BID) | ORAL | Status: AC

## 2015-06-11 NOTE — ED Provider Notes (Signed)
[facility] Emergency Department Provider Note  ____________________________________________  Time seen: Approximately 2333 PM  I have reviewed the triage vital signs and the nursing notes.   HISTORY  Chief Complaint Fall; Back Pain; and Dysuria    HPI Debra [name] is a 54 y.o. female who was here a few weeks ago and diagnosed with sciatica. The patient reports that she went to see her primary care physician and they have been trying to get her into see a neurosurgeon. The patient reports though that with the sciatica her left leg gives out on her and she fell twice yesterday. The patient reports that both times she fell onto her knees. She reports since then she's had some pain when she urinates. The patient reports that she called her primary care physician's office and was told to come into the ER to be seen. The patient denies falling on her bottom or on her back. The patient has continued to have some pain in her sciatic area but reports her leg flies out from under her. She reports that the pain has also been going across her back. She's noticed some increased yellowness of her urine and some mild swelling in her legs and feet.He reports that her pain is a 4 out of 10 in intensity.   Past Medical History  Diagnosis Date  . Hypertension   . Insomnia   . Diabetes mellitus without complication   . Anxiety   . Depression   . Hyperlipidemia   . Obesity   . Tobacco abuse     Patient Active Problem List   Diagnosis Date Noted  . Left-sided low back pain with sciatica 06/03/2015  . Abdominal gas pain 06/03/2015  . Abdominal pain, generalized 06/03/2015  . Affective bipolar disorder 06/03/2015  . Insomnia, persistent 06/03/2015  . Diabetes mellitus, type 2 06/03/2015  . BP (high blood pressure) 06/03/2015  . Type 2 diabetes mellitus treated with insulin 06/03/2015  . Leg swelling 06/03/2015  . Extreme obesity 06/03/2015  . Compulsive tobacco user syndrome  06/03/2015  . Candida vaginitis 06/03/2015  . Phlebectasia 06/03/2015    Past Surgical History  Procedure Laterality Date  . Cholecystectomy  2013  . Abdominal hysterectomy  2012  . Cesarean section  1992    Current Outpatient Rx  Name  Route  Sig  Dispense  Refill  . amLODipine (NORVASC) 10 MG tablet   Oral   Take 1 tablet (10 mg total) by mouth daily.   90 tablet   0   . aspirin EC 81 MG tablet   Oral   Take 81 mg by mouth daily.         . calcium-vitamin D (OSCAL WITH D) 500-200 MG-UNIT per tablet   Oral   Take 1 tablet by mouth daily.          . cholecalciferol (VITAMIN D) 1000 UNITS tablet   Oral   Take 2,000 Units by mouth daily.         . diazepam (VALIUM) 2 MG tablet   Oral   Take 1 tablet (2 mg total) by mouth every 8 (eight) hours as needed for muscle spasms.   15 tablet   0   . folic acid (FOLVITE) 400 MCG tablet   Oral   Take 400 mcg by mouth daily.         [name] glipiZIDE (GLUCOTROL) 10 MG tablet   Oral   Take 10 mg by mouth daily.          [name]  ibuprofen (ADVIL,MOTRIN) 800 MG tablet   Oral   Take 1 tablet (800 mg total) by mouth every 8 (eight) hours as needed for moderate pain.   15 tablet   0   . insulin glargine (LANTUS) 100 UNIT/ML injection   Subcutaneous   Inject 40 Units into the skin at bedtime.          [name] linagliptin (TRADJENTA) 5 MG TABS tablet   Oral   Take 10 mg by mouth daily.         [name] lisinopril (PRINIVIL,ZESTRIL) 10 MG tablet   Oral   Take 1 tablet (10 mg total) by mouth daily.   90 tablet   0   . nitrofurantoin, macrocrystal-monohydrate, (MACROBID) 100 MG capsule   Oral   Take 1 capsule (100 mg total) by mouth 2 (two) times daily.   10 capsule   0   . oxyCODONE-acetaminophen (ROXICET) 5-325 MG per tablet   Oral   Take 1 tablet by mouth every 4 (four) hours as needed for severe pain.   15 tablet   0   . PARoxetine (PAXIL) 40 MG tablet   Oral   Take 40 mg by mouth every morning.            Allergies Benadryl; Cholesterol; Depakote er; Dilaudid; Neosporin; Singulair; Statins; Sulfa antibiotics; and Victoza  Family History  Problem Relation Age of Onset  . COPD Mother   . Bipolar disorder Father   . Bipolar disorder Brother   . Diabetes Mellitus II Father   . Diabetes Mellitus II Brother     Social History History  Substance Use Topics  . Smoking status: Current Every Day Smoker  . Smokeless tobacco: Not on file  . Alcohol Use: No     Comment: occasional    Review of Systems Constitutional: No fever/chills Eyes: No visual changes. ENT: No sore throat. Cardiovascular: Denies chest pain. Respiratory: Denies shortness of breath. Gastrointestinal: No abdominal pain.  No nausea, no vomiting.  No diarrhea.  No constipation. Genitourinary:  dysuria. Musculoskeletal:  back pain. Skin: Negative for rash. Neurological: Negative for headaches, focal weakness or numbness.  10-point ROS otherwise negative.  ____________________________________________   PHYSICAL EXAM:  VITAL SIGNS: ED Triage Vitals  Enc Vitals Group     BP 06/10/15 2240 140/70 mmHg     Pulse Rate 06/10/15 2240 79     Resp 06/10/15 2240 20     Temp 06/10/15 2240 98.5 F (36.9 C)     Temp Source 06/10/15 2240 Oral     SpO2 06/10/15 2240 96 %     Weight 06/10/15 2240 214 lb (97.07 kg)     Height 06/10/15 2240 5\' 5"  (1.651 m)     Head Cir --      Peak Flow --      Pain Score 06/10/15 2241 5     Pain Loc --      Pain Edu? --      Excl. in GC? --     Constitutional: Alert and oriented. Well appearing and in mild distress. Eyes: Conjunctivae are normal. PERRL. EOMI. Head: Atraumatic. Nose: No congestion/rhinnorhea. Mouth/Throat: Mucous membranes are moist.  Oropharynx non-erythematous. Cardiovascular: Normal rate, regular rhythm. Grossly normal heart sounds.  Good peripheral circulation. Respiratory: Normal respiratory effort.  No retractions. Lungs CTAB. Gastrointestinal: Soft and  nontender. No distention. Positive bowel sounds Genitourinary: Deferred Musculoskeletal: Tenderness to palpation of left lower back and sciatic area  Neurologic:  Normal speech and language. No gross  focal neurologic deficits are appreciated.  Skin:  Skin is warm, dry and intact. No rash noted. Psychiatric: Mood and affect are normal.   ____________________________________________   LABS (all labs ordered are listed, but only abnormal results are displayed)  Labs Reviewed  URINALYSIS COMPLETEWITH MICROSCOPIC ([facility] ONLY) - Abnormal; Notable for the following:    Color, Urine STRAW (*)    APPearance CLEAR (*)    Glucose, UA >500 (*)    Bacteria, UA RARE (*)    All other components within normal limits  GLUCOSE, CAPILLARY - Abnormal; Notable for the following:    Glucose-Capillary 443 (*)    All other components within normal limits  GLUCOSE, CAPILLARY - Abnormal; Notable for the following:    Glucose-Capillary 264 (*)    All other components within normal limits  URINE CULTURE  CBG MONITORING, ED  CBG MONITORING, ED   ____________________________________________  EKG  None ____________________________________________  RADIOLOGY  None ____________________________________________   PROCEDURES  Procedure(s) performed: None  Critical Care performed: No  ____________________________________________   INITIAL IMPRESSION / ASSESSMENT AND PLAN / ED COURSE  Pertinent labs & imaging results that were available during my care of the patient were reviewed by me and considered in my medical decision making (see chart for details).  The patient is a 54 year old female who comes in with some pain with urination after falling. The patient reports that she has also been urinating frequently. The patient clinically does have some symptoms of UTI so we will send a culture and I will treat her with nitrofurantoin. I did notice that the patient had greater than 500 of glucose in her  urine I checked blood sugar which was over 400. I did give the patient some oral hydration and 10 units of subcutaneous insulin. Patient also did request something for pain so I gave her some Flexeril and ibuprofen for her back pain. I will reassess the patient when she's received her medications.  ----------------------------------------- 3:40 AM on 06/11/2015 -----------------------------------------  After the insulin and oral hydration the patient's blood sugar is now 264. I will discharge the patient to home with nitrofurantoin as she does have the clinical symptoms of a urinary tract infection. The patient reports that she does have an appointment to see her physician on July 6 which is tomorrow. I will encourage the patient to follow-up with her doctor and continue to take the antibiotics. Otherwise patient has no other concerns. ____________________________________________   FINAL CLINICAL IMPRESSION(S) / ED DIAGNOSES  Final diagnoses:  Sciatica, left  Dysuria  Hyperglycemia      Rebecka ApleyAllison P Shonita Rinck, MD 06/11/15 [phone]

## 2015-06-11 NOTE — ED Notes (Signed)
POC CBG result= 264 mg/dL

## 2015-06-11 NOTE — ED Notes (Signed)
POC CBG result= 443 mg/dL

## 2015-06-11 NOTE — ED Notes (Signed)
Pt reports 1 episode of vomiting but denies currently being nauseous; MD made aware.

## 2015-06-11 NOTE — Discharge Instructions (Signed)
Dysuria Dysuria is the medical term for pain with urination. There are many causes for dysuria, but urinary tract infection is the most common. If a urinalysis was performed it can show that there is a urinary tract infection. A urine culture confirms that you or your child is sick. You will need to follow up with a healthcare provider because:  If a urine culture was done you will need to know the culture results and treatment recommendations.  If the urine culture was positive, you or your child will need to be put on antibiotics or know if the antibiotics prescribed are the right antibiotics for your urinary tract infection.  If the urine culture is negative (no urinary tract infection), then other causes may need to be explored or antibiotics need to be stopped. Today laboratory work may have been done and there does not seem to be an infection. If cultures were done they will take at least 24 to 48 hours to be completed. Today x-rays may have been taken and they read as normal. No cause can be found for the problems. The x-rays may be re-read by a radiologist and you will be contacted if additional findings are made. You or your child may have been put on medications to help with this problem until you can see your primary caregiver. If the problems get better, see your primary caregiver if the problems return. If you were given antibiotics (medications which kill germs), take all of the mediations as directed for the full course of treatment.  If laboratory work was done, you need to find the results. Leave a telephone number where you can be reached. If this is not possible, make sure you find out how you are to get test results. HOME CARE INSTRUCTIONS   Drink lots of fluids. For adults, drink eight, 8 ounce glasses of clear juice or water a day. For children, replace fluids as suggested by your caregiver.  Empty the bladder often. Avoid holding urine for long periods of time.  After a bowel  movement, women should cleanse front to back, using each tissue only once.  Empty your bladder before and after sexual intercourse.  Take all the medicine given to you until it is gone. You may feel better in a few days, but TAKE ALL MEDICINE.  Avoid caffeine, tea, alcohol and carbonated beverages, because they tend to irritate the bladder.  In men, alcohol may irritate the prostate.  Only take over-the-counter or prescription medicines for pain, discomfort, or fever as directed by your caregiver.  If your caregiver has given you a follow-up appointment, it is very important to keep that appointment. Not keeping the appointment could result in a chronic or permanent injury, pain, and disability. If there is any problem keeping the appointment, you must call back to this facility for assistance. SEEK IMMEDIATE MEDICAL CARE IF:   Back pain develops.  A fever develops.  There is nausea (feeling sick to your stomach) or vomiting (throwing up).  Problems are no better with medications or are getting worse. MAKE SURE YOU:   Understand these instructions.  Will watch your condition.  Will get help right away if you are not doing well or get worse. Document Released: 08/21/2004 Document Revised: 02/15/2012 Document Reviewed: 06/28/2008 [facility] Patient Information 2015 [place], [place]. This information is not intended to replace advice given to you by your health care provider. Make sure you discuss any questions you have with your health care provider.  Hyperglycemia Hyperglycemia occurs  when the glucose (sugar) in your blood is too high. Hyperglycemia can happen for many reasons, but it most often happens to people who do not know they have diabetes or are not managing their diabetes properly.  CAUSES  Whether you have diabetes or not, there are other causes of hyperglycemia. Hyperglycemia can occur when you have diabetes, but it can also occur in other situations that you might not be  as aware of, such as: Diabetes  If you have diabetes and are having problems controlling your blood glucose, hyperglycemia could occur because of some of the following reasons:  Not following your meal plan.  Not taking your diabetes medications or not taking it properly.  Exercising less or doing less activity than you normally do.  Being sick. Pre-diabetes  This cannot be ignored. Before people develop Type 2 diabetes, they almost always have "pre-diabetes." This is when your blood glucose levels are higher than normal, but not yet high enough to be diagnosed as diabetes. Research has shown that some long-term damage to the body, especially the heart and circulatory system, may already be occurring during pre-diabetes. If you take action to manage your blood glucose when you have pre-diabetes, you may delay or prevent Type 2 diabetes from developing. Stress  If you have diabetes, you may be "diet" controlled or on oral medications or insulin to control your diabetes. However, you may find that your blood glucose is higher than usual in the hospital whether you have diabetes or not. This is often referred to as "stress hyperglycemia." Stress can elevate your blood glucose. This happens because of hormones put out by the body during times of stress. If stress has been the cause of your high blood glucose, it can be followed regularly by your caregiver. That way he/she can make sure your hyperglycemia does not continue to get worse or progress to diabetes. Steroids  Steroids are medications that act on the infection fighting system (immune system) to block inflammation or infection. One side effect can be a rise in blood glucose. Most people can produce enough extra insulin to allow for this rise, but for those who cannot, steroids make blood glucose levels go even higher. It is not unusual for steroid treatments to "uncover" diabetes that is developing. It is not always possible to determine if  the hyperglycemia will go away after the steroids are stopped. A special blood test called an A1c is sometimes done to determine if your blood glucose was elevated before the steroids were started. SYMPTOMS  Thirsty.  Frequent urination.  Dry mouth.  Blurred vision.  Tired or fatigue.  Weakness.  Sleepy.  Tingling in feet or leg. DIAGNOSIS  Diagnosis is made by monitoring blood glucose in one or all of the following ways:  A1c test. This is a chemical found in your blood.  Fingerstick blood glucose monitoring.  Laboratory results. TREATMENT  First, knowing the cause of the hyperglycemia is important before the hyperglycemia can be treated. Treatment may include, but is not be limited to:  Education.  Change or adjustment in medications.  Change or adjustment in meal plan.  Treatment for an illness, infection, etc.  More frequent blood glucose monitoring.  Change in exercise plan.  Decreasing or stopping steroids.  Lifestyle changes. HOME CARE INSTRUCTIONS   Test your blood glucose as directed.  Exercise regularly. Your caregiver will give you instructions about exercise. Pre-diabetes or diabetes which comes on with stress is helped by exercising.  Eat wholesome, balanced meals.  Eat often and at regular, fixed times. Your caregiver or nutritionist will give you a meal plan to guide your sugar intake.  Being at an ideal weight is important. If needed, losing as little as 10 to 15 pounds may help improve blood glucose levels. SEEK MEDICAL CARE IF:   You have questions about medicine, activity, or diet.  You continue to have symptoms (problems such as increased thirst, urination, or weight gain). SEEK IMMEDIATE MEDICAL CARE IF:   You are vomiting or have diarrhea.  Your breath smells fruity.  You are breathing faster or slower.  You are very sleepy or incoherent.  You have numbness, tingling, or pain in your feet or hands.  You have chest  pain.  Your symptoms get worse even though you have been following your caregiver's orders.  If you have any other questions or concerns. Document Released: 05/19/2001 Document Revised: 02/15/2012 Document Reviewed: 03/21/2012 [facility] Patient Information 2015 [place], [place]. This information is not intended to replace advice given to you by your health care provider. Make sure you discuss any questions you have with your health care provider.  Sciatica Sciatica is pain, weakness, numbness, or tingling along the path of the sciatic nerve. The nerve starts in the lower back and runs down the back of each leg. The nerve controls the muscles in the lower leg and in the back of the knee, while also providing sensation to the back of the thigh, lower leg, and the sole of your foot. Sciatica is a symptom of another medical condition. For instance, nerve damage or certain conditions, such as a herniated disk or bone spur on the spine, pinch or put pressure on the sciatic nerve. This causes the pain, weakness, or other sensations normally associated with sciatica. Generally, sciatica only affects one side of the body. CAUSES   Herniated or slipped disc.  Degenerative disk disease.  A pain disorder involving the narrow muscle in the buttocks (piriformis syndrome).  Pelvic injury or fracture.  Pregnancy.  Tumor (rare). SYMPTOMS  Symptoms can vary from mild to very severe. The symptoms usually travel from the low back to the buttocks and down the back of the leg. Symptoms can include:  Mild tingling or dull aches in the lower back, leg, or hip.  Numbness in the back of the calf or sole of the foot.  Burning sensations in the lower back, leg, or hip.  Sharp pains in the lower back, leg, or hip.  Leg weakness.  Severe back pain inhibiting movement. These symptoms may get worse with coughing, sneezing, laughing, or prolonged sitting or standing. Also, being overweight may worsen  symptoms. DIAGNOSIS  Your caregiver will perform a physical exam to look for common symptoms of sciatica. He or she may ask you to do certain movements or activities that would trigger sciatic nerve pain. Other tests may be performed to find the cause of the sciatica. These may include:  Blood tests.  X-rays.  Imaging tests, such as an MRI or CT scan. TREATMENT  Treatment is directed at the cause of the sciatic pain. Sometimes, treatment is not necessary and the pain and discomfort goes away on its own. If treatment is needed, your caregiver may suggest:  Over-the-counter medicines to relieve pain.  Prescription medicines, such as anti-inflammatory medicine, muscle relaxants, or narcotics.  Applying heat or ice to the painful area.  Steroid injections to lessen pain, irritation, and inflammation around the nerve.  Reducing activity during periods of pain.  Exercising and  stretching to strengthen your abdomen and improve flexibility of your spine. Your caregiver may suggest losing weight if the extra weight makes the back pain worse.  Physical therapy.  Surgery to eliminate what is pressing or pinching the nerve, such as a bone spur or part of a herniated disk. HOME CARE INSTRUCTIONS   Only take over-the-counter or prescription medicines for pain or discomfort as directed by your caregiver.  Apply ice to the affected area for 20 minutes, 3-4 times a day for the first 48-72 hours. Then try heat in the same way.  Exercise, stretch, or perform your usual activities if these do not aggravate your pain.  Attend physical therapy sessions as directed by your caregiver.  Keep all follow-up appointments as directed by your caregiver.  Do not wear high heels or shoes that do not provide proper support.  Check your mattress to see if it is too soft. A firm mattress may lessen your pain and discomfort. SEEK IMMEDIATE MEDICAL CARE IF:   You lose control of your bowel or bladder  (incontinence).  You have increasing weakness in the lower back, pelvis, buttocks, or legs.  You have redness or swelling of your back.  You have a burning sensation when you urinate.  You have pain that gets worse when you lie down or awakens you at night.  Your pain is worse than you have experienced in the past.  Your pain is lasting longer than 4 weeks.  You are suddenly losing weight without reason. MAKE SURE YOU:  Understand these instructions.  Will watch your condition.  Will get help right away if you are not doing well or get worse. Document Released: 11/17/2001 Document Revised: 05/24/2012 Document Reviewed: 04/03/2012 [facility] Patient Information 2015 [place]. This information is not intended to replace advice given to you by your health care provider. Make sure you discuss any questions you have with your health care provider.

## 2015-06-12 LAB — URINE CULTURE: Special Requests: NORMAL

## 2015-06-12 NOTE — Telephone Encounter (Signed)
LVM for patient to call office back regarding referral.  [facility] is still reviewing her records in order to schedule her and appt.

## 2015-06-12 NOTE — Telephone Encounter (Signed)
Pt is requesting a return call concerning the referral request. [phone]

## 2015-06-13 ENCOUNTER — Ambulatory Visit (INDEPENDENT_AMBULATORY_CARE_PROVIDER_SITE_OTHER): Payer: Commercial Managed Care - HMO | Admitting: Family Medicine

## 2015-06-13 ENCOUNTER — Encounter: Payer: Self-pay | Admitting: Family Medicine

## 2015-06-13 VITALS — BP 126/84 | HR 103 | Wt 220.0 lb

## 2015-06-13 DIAGNOSIS — Z794 Long term (current) use of insulin: Secondary | ICD-10-CM

## 2015-06-13 DIAGNOSIS — E119 Type 2 diabetes mellitus without complications: Secondary | ICD-10-CM

## 2015-06-13 NOTE — Progress Notes (Signed)
Name: Debra [name]   MRN: [id number]    DOB: 1961-03-17   Date:06/13/2015       Progress Note  Subjective  Chief Complaint  Chief Complaint  Patient presents with  . Diabetes    1 wk f/u ?? increase medication    Diabetes She presents for her follow-up diabetic visit. She has type 2 diabetes mellitus. Her disease course has been fluctuating. There are no hypoglycemic associated symptoms. Associated symptoms include polydipsia. Pertinent negatives for diabetes include no chest pain, no fatigue and no polyuria. Symptoms are stable. Pertinent negatives for diabetic complications include no CVA, heart disease or nephropathy. Current diabetic treatment includes oral agent (dual therapy) and intensive insulin program. She is following a diabetic diet. She participates in exercise intermittently. Her breakfast blood glucose is taken between 7-8 am. Her breakfast blood glucose range is generally >200 mg/dl. An ACE inhibitor/angiotensin II receptor blocker is being taken. Eye exam is current.      Past Medical History  Diagnosis Date  . Hypertension   . Insomnia   . Diabetes mellitus without complication   . Anxiety   . Depression   . Hyperlipidemia   . Obesity   . Tobacco abuse     Past Surgical History  Procedure Laterality Date  . Cholecystectomy  2013  . Abdominal hysterectomy  2012  . Cesarean section  1992    Family History  Problem Relation Age of Onset  . COPD Mother   . Bipolar disorder Father   . Bipolar disorder Brother   . Diabetes Mellitus II Father   . Diabetes Mellitus II Brother     History   Social History  . Marital Status: Married    Spouse Name: N/A  . Number of Children: N/A  . Years of Education: N/A   Occupational History  . Not on file.   Social History Main Topics  . Smoking status: Current Every Day Smoker  . Smokeless tobacco: Not on file  . Alcohol Use: No     Comment: occasional  . Drug Use: No  . Sexual Activity: Not on file   Other  Topics Concern  . Not on file   Social History Narrative     Current outpatient prescriptions:  .  amLODipine (NORVASC) 10 MG tablet, Take 1 tablet (10 mg total) by mouth daily., Disp: 90 tablet, Rfl: 0 .  aspirin EC 81 MG tablet, Take 81 mg by mouth daily., Disp: , Rfl:  .  calcium-vitamin D (OSCAL WITH D) 500-200 MG-UNIT per tablet, Take 1 tablet by mouth daily. , Disp: , Rfl:  .  folic acid (FOLVITE) 400 MCG tablet, Take 400 mcg by mouth daily., Disp: , Rfl:  .  glipiZIDE (GLUCOTROL) 10 MG tablet, Take 10 mg by mouth daily. , Disp: , Rfl:  .  ibuprofen (ADVIL,MOTRIN) 800 MG tablet, Take 1 tablet (800 mg total) by mouth every 8 (eight) hours as needed for moderate pain., Disp: 15 tablet, Rfl: 0 .  insulin glargine (LANTUS) 100 UNIT/ML injection, Inject 40 Units into the skin at bedtime. , Disp: , Rfl:  .  linagliptin (TRADJENTA) 5 MG TABS tablet, Take 5 mg by mouth daily. , Disp: , Rfl:  .  lisinopril (PRINIVIL,ZESTRIL) 10 MG tablet, Take 1 tablet (10 mg total) by mouth daily., Disp: 90 tablet, Rfl: 0 .  nitrofurantoin, macrocrystal-monohydrate, (MACROBID) 100 MG capsule, Take 1 capsule (100 mg total) by mouth 2 (two) times daily., Disp: 10 capsule, Rfl: 0 .  oxyCODONE-acetaminophen (ROXICET) 5-325 MG per tablet, Take 1 tablet by mouth every 4 (four) hours as needed for severe pain., Disp: 15 tablet, Rfl: 0 .  PARoxetine (PAXIL) 40 MG tablet, Take 40 mg by mouth every morning., Disp: , Rfl:  .  cholecalciferol (VITAMIN D) 1000 UNITS tablet, Take 2,000 Units by mouth daily., Disp: , Rfl:  .  diazepam (VALIUM) 2 MG tablet, Take 1 tablet (2 mg total) by mouth every 8 (eight) hours as needed for muscle spasms. (Patient not taking: Reported on 06/13/2015), Disp: 15 tablet, Rfl: 0  Allergies  Allergen Reactions  . Benadryl [Diphenhydramine Hcl] Swelling    blistering  . Cholesterol Other (See Comments)    meds cause her to pass out  . Depakote Er [Divalproex Sodium Er] Other (See Comments)     seizures  . Dilaudid [Hydromorphone Hcl] Other (See Comments)    seizures  . Neosporin [Neomycin-Bacitracin Zn-Polymyx] Swelling    blistering  . Singulair [Montelukast Sodium] Other (See Comments)    Asthma attacks  . Statins Other (See Comments)  . Sulfa Antibiotics Hives  . Victoza [Liraglutide] Other (See Comments)    pancreatitis     Review of Systems  Constitutional: Negative for fatigue.  Cardiovascular: Negative for chest pain.  Endo/Heme/Allergies: Positive for polydipsia.      Objective  Filed Vitals:   06/13/15 1352  BP: 126/84  Pulse: 103  Weight: 220 lb (99.791 kg)  SpO2: 94%    Physical Exam  Constitutional: She is oriented to person, place, and time and well-developed, well-nourished, and in no distress.  HENT:  Head: Normocephalic and atraumatic.  Cardiovascular: Normal rate and regular rhythm.   Pulmonary/Chest: Effort normal and breath sounds normal.  Neurological: She is alert and oriented to person, place, and time.  Nursing note and vitals reviewed.    Assessment & Plan 1. Type 2 diabetes mellitus treated with insulin Recheck A1c and follow-up with medication management. - HgB A1c - Lipid Profile   Chaka Boyson Asad A. Faylene KurtzShah [facility] 06/13/2015 6:34 PM

## 2015-06-15 LAB — LIPID PANEL
CHOL/HDL RATIO: 5.2 ratio — AB (ref 0.0–4.4)
Cholesterol, Total: 228 mg/dL — ABNORMAL HIGH (ref 100–199)
HDL: 44 mg/dL (ref >39)
LDL Calculated: 154 mg/dL — ABNORMAL HIGH (ref 0–99)
Triglycerides: 149 mg/dL (ref 0–149)
VLDL Cholesterol Cal: 30 mg/dL (ref 5–40)

## 2015-06-15 LAB — HEMOGLOBIN A1C
Est. average glucose Bld gHb Est-mCnc: 321 mg/dL
HEMOGLOBIN A1C: 12.8 % — AB (ref 4.8–5.6)

## 2015-06-17 ENCOUNTER — Telehealth: Payer: Self-pay | Admitting: Family Medicine

## 2015-06-17 DIAGNOSIS — M5442 Lumbago with sciatica, left side: Secondary | ICD-10-CM

## 2015-06-17 MED ORDER — OXYCODONE-ACETAMINOPHEN 5-325 MG PO TABS: 1.0000 | ORAL_TABLET | Freq: Three times a day (TID) | ORAL | Status: DC | PRN

## 2015-06-17 NOTE — Assessment & Plan Note (Addendum)
Patient is requesting Percocet 5-325 mg until her appointment with neurology on 06/20/2015. She has left-sided radicular low back pain. We will prescribe three-day supply of Percocet 5 mg to be taken every 8 hours as needed. Prescription printed and patient will pick up

## 2015-06-17 NOTE — Telephone Encounter (Signed)
Patient wants to know if you can give her a RX for percocet until she sees neurologist on 06/20/15.

## 2015-06-18 ENCOUNTER — Telehealth: Payer: Self-pay | Admitting: Family Medicine

## 2015-06-18 NOTE — Telephone Encounter (Signed)
Pt informed RX is ready for pick up.

## 2015-06-18 NOTE — Telephone Encounter (Signed)
Spoke with Patient informed her of results, she has an appt scheduled for 07/22/2015 but she is going to schedule an earlier appt to discuss cholesterol and Diabetes Management.

## 2015-06-27 ENCOUNTER — Other Ambulatory Visit: Payer: Self-pay | Admitting: Family Medicine

## 2015-06-27 DIAGNOSIS — E119 Type 2 diabetes mellitus without complications: Secondary | ICD-10-CM

## 2015-06-27 MED ORDER — INSULIN GLARGINE 100 UNIT/ML SOLOSTAR PEN: 40.0000 [IU] | PEN_INJECTOR | Freq: Every day | SUBCUTANEOUS | Status: DC

## 2015-06-27 MED ORDER — GLIPIZIDE ER 10 MG PO TB24: 10.0000 mg | ORAL_TABLET | Freq: Every day | ORAL | Status: DC

## 2015-06-27 MED ORDER — LINAGLIPTIN 5 MG PO TABS: 5.0000 mg | ORAL_TABLET | Freq: Every day | ORAL | Status: DC

## 2015-06-30 ENCOUNTER — Other Ambulatory Visit: Payer: Self-pay | Admitting: Family Medicine

## 2015-07-03 ENCOUNTER — Other Ambulatory Visit: Payer: Self-pay | Admitting: Neurosurgery

## 2015-07-03 DIAGNOSIS — M5416 Radiculopathy, lumbar region: Secondary | ICD-10-CM

## 2015-07-11 ENCOUNTER — Ambulatory Visit
Admission: RE | Admit: 2015-07-11 | Discharge: 2015-07-11 | Disposition: A | Discharge status: Home / Self Care | Payer: Commercial Managed Care - HMO | Source: Ambulatory Visit | Attending: Neurosurgery | Admitting: Neurosurgery

## 2015-07-11 DIAGNOSIS — M5416 Radiculopathy, lumbar region: Secondary | ICD-10-CM

## 2015-07-11 DIAGNOSIS — M5127 Other intervertebral disc displacement, lumbosacral region: Secondary | ICD-10-CM | POA: Diagnosis not present

## 2015-07-11 DIAGNOSIS — M4806 Spinal stenosis, lumbar region: Secondary | ICD-10-CM | POA: Diagnosis not present

## 2015-07-19 ENCOUNTER — Other Ambulatory Visit: Payer: Self-pay | Admitting: Neurosurgery

## 2015-07-22 ENCOUNTER — Ambulatory Visit (INDEPENDENT_AMBULATORY_CARE_PROVIDER_SITE_OTHER): Payer: Commercial Managed Care - HMO

## 2015-07-22 ENCOUNTER — Encounter (HOSPITAL_COMMUNITY): Payer: Self-pay | Admitting: Emergency Medicine

## 2015-07-22 ENCOUNTER — Other Ambulatory Visit: Payer: Self-pay | Admitting: Neurosurgery

## 2015-07-22 ENCOUNTER — Encounter (HOSPITAL_COMMUNITY)
Admission: RE | Admit: 2015-07-22 | Discharge: 2015-07-22 | Disposition: A | Discharge status: Home / Self Care | Payer: Commercial Managed Care - HMO | Source: Ambulatory Visit | Attending: Neurosurgery | Admitting: Neurosurgery

## 2015-07-22 ENCOUNTER — Encounter (HOSPITAL_COMMUNITY): Payer: Self-pay

## 2015-07-22 DIAGNOSIS — E785 Hyperlipidemia, unspecified: Secondary | ICD-10-CM | POA: Insufficient documentation

## 2015-07-22 DIAGNOSIS — F17200 Nicotine dependence, unspecified, uncomplicated: Secondary | ICD-10-CM | POA: Diagnosis not present

## 2015-07-22 DIAGNOSIS — Z01812 Encounter for preprocedural laboratory examination: Secondary | ICD-10-CM | POA: Diagnosis not present

## 2015-07-22 DIAGNOSIS — Z7982 Long term (current) use of aspirin: Secondary | ICD-10-CM | POA: Diagnosis not present

## 2015-07-22 DIAGNOSIS — E119 Type 2 diabetes mellitus without complications: Secondary | ICD-10-CM | POA: Diagnosis not present

## 2015-07-22 DIAGNOSIS — I10 Essential (primary) hypertension: Secondary | ICD-10-CM | POA: Diagnosis not present

## 2015-07-22 DIAGNOSIS — Z794 Long term (current) use of insulin: Secondary | ICD-10-CM | POA: Diagnosis not present

## 2015-07-22 DIAGNOSIS — Z23 Encounter for immunization: Secondary | ICD-10-CM | POA: Diagnosis not present

## 2015-07-22 DIAGNOSIS — Z79899 Other long term (current) drug therapy: Secondary | ICD-10-CM | POA: Diagnosis not present

## 2015-07-22 DIAGNOSIS — Z01818 Encounter for other preprocedural examination: Secondary | ICD-10-CM | POA: Diagnosis present

## 2015-07-22 HISTORY — DX: Family history of other specified conditions: Z84.89

## 2015-07-22 HISTORY — DX: Unspecified intracranial injury with loss of consciousness of 30 minutes or less, initial encounter: S06.9X1A

## 2015-07-22 HISTORY — DX: Other seasonal allergic rhinitis: J30.2

## 2015-07-22 HISTORY — DX: Unspecified intracranial injury with loss of consciousness of unspecified duration, initial encounter: S06.9X9A

## 2015-07-22 HISTORY — DX: Unspecified osteoarthritis, unspecified site: M19.90

## 2015-07-22 LAB — BASIC METABOLIC PANEL
Anion gap: 11 (ref 5–15)
BUN: 18 mg/dL (ref 6–20)
CHLORIDE: 104 mmol/L (ref 101–111)
CO2: 22 mmol/L (ref 22–32)
Calcium: 9.2 mg/dL (ref 8.9–10.3)
Creatinine, Ser: 0.71 mg/dL (ref 0.44–1.00)
Glucose, Bld: 391 mg/dL — ABNORMAL HIGH (ref 65–99)
POTASSIUM: 3.8 mmol/L (ref 3.5–5.1)
SODIUM: 137 mmol/L (ref 135–145)

## 2015-07-22 LAB — CBC
HEMATOCRIT: 44.8 % (ref 36.0–46.0)
Hemoglobin: 15.6 g/dL — ABNORMAL HIGH (ref 12.0–15.0)
MCH: 32.3 pg (ref 26.0–34.0)
MCHC: 34.8 g/dL (ref 30.0–36.0)
MCV: 92.8 fL (ref 78.0–100.0)
PLATELETS: 253 10*3/uL (ref 150–400)
RBC: 4.83 MIL/uL (ref 3.87–5.11)
RDW: 12.2 % (ref 11.5–15.5)
WBC: 11.3 10*3/uL — AB (ref 4.0–10.5)

## 2015-07-22 LAB — SURGICAL PCR SCREEN
MRSA, PCR: NEGATIVE
Staphylococcus aureus: POSITIVE — AB

## 2015-07-22 LAB — GLUCOSE, CAPILLARY: Glucose-Capillary: 381 mg/dL — ABNORMAL HIGH (ref 65–99)

## 2015-07-22 NOTE — Progress Notes (Signed)
I called a prescription for Mupirocin ointment to [address], [address] [place], [place]

## 2015-07-22 NOTE — Pre-Procedure Instructions (Signed)
Debra [name]  07/22/2015      Your procedure is scheduled on Wednesday, August 17.  Report to [facility] Admitting at 8:30 per Dr Cassandria Santee instruction A.M.              Your surgery is scheduled for 11:35 A.M.   Call this number if you have problems the morning of surgery [phone]              For any other questions, please call [phone], Monday - Friday 8 AM - 4 PM.   Remember:  Do not eat food or drink liquids after midnight Tuesday, August 16.  Take these medicines the morning of surgery with A SIP OF WATER : amLODipine (NORVASC), PARoxetine (PAXIL).                Stop taking Advil (Ibuprofen).  What do I do about my diabetes medications?   Do not take oral diabetes medicines (pills) the morning of surgery.  THE NIGHT BEFORE SURGERY:  take 32 units of Lantus Insulin.  Do not take other diabetes injectables the day of surgery including Byetta, Victoza, Bydureon, and Trulicity.                 How to Manage Your Diabetes Before Surgery   Why is it important to control my blood sugar before and after surgery?   Improving blood sugar levels before and after surgery helps healing and can limit problems.  A way of improving blood sugar control is eating a healthy diet by:  - Eating less sugar and carbohydrates  - Increasing activity/exercise  - Talk with your doctor about reaching your blood sugar goals  High blood sugars (greater than 180 mg/dL) can raise your risk of infections and slow down your recovery so you will need to focus on controlling your diabetes during the weeks before surgery.  Make sure that the doctor who takes care of your diabetes knows about your planned surgery including the date and location.  How do I manage my blood sugars before surgery?   Check your blood sugar at least 4 times a day, 2 days before surgery to make sure that they are not too high or low.    Check your blood sugar the morning of your surgery when you wake  up and every 2  hours until you get to the Short-Stay unit.  If your blood sugar is less than 70 mg/dL, you will need to treat for low blood sugar by:  Treat a low blood sugar (less than 70 mg/dL) with 1/2 cup of clear juice (cranberry or apple), 4 glucose tablets, OR glucose gel.  Recheck blood sugar in 15 minutes after treatment (to make sure it is greater than 70 mg/dL).  If blood sugar is not greater than 70 mg/dL on re-check, call [phone] for further instructions.   Report your blood sugar to the Short-Stay nurse when you get to Short-Stay    Do not wear jewelry, make-up or nail polish.  Do not wear lotions, powders, or perfumes.    Do not shave 48 hours prior to surgery.   Do not bring valuables to the hospital.  [facility] is not responsible for any belongings or valuables.  Contacts, dentures or bridgework may not be worn into surgery.  Leave your suitcase in the car.  After surgery it may be brought to your room.  For patients admitted to the hospital, discharge time will be determined by  your treatment team.  Patients discharged the day of surgery will not be allowed to drive home.   Name and phone number of your driver:  -  Special instructions:  Review  [place] - Preparing For Surgery.  Please read over the following fact sheets that you were given. Pain Booklet, Coughing and Deep Breathing and Surgical Site Infection Prevention

## 2015-07-22 NOTE — Progress Notes (Signed)
Debra [name] reported that Fasting CBG's un in the 150 range, CBG on arrival to PAT  was 381. Patient see Dr Sherryll Burger as PCP and he manages DM.  Patient reported that she  was started back on Lantus Insulin 06/14/15, hgb A1C was 12.8 on 11/14/15  Patient  thought she was going to see Dr Sherryll Burger, but she was jut at the office for Hepatitis B shot.  Debra [name] reported that she was seen at [facility] 2 -3 years ago for lower leg edema. Patient stated that she had lots of test at [facility] and they were normal, she was started on Lasix (she is not sure of dose, I'll call pharmacy).  Debra [name] reported that she was told that she need not come back to cardiology clinic unless she had problems. I requested records from [facility] cardiology.

## 2015-07-22 NOTE — Progress Notes (Signed)
Anesthesia Chart Review:  Pt is 54 year old female scheduled for L4-5 foraminotomy/ L L5-S1 discectomy on 07/24/2015 with Dr. Jeral Fruit.   PCP is Dr. Brayton El, last office visit 06/14/15.   PMH includes: HTN, DM, hyperlipidemia. Current smoker. BMI 35.5.  Medications include: amlodipine, ASA, glipizide, lantus, linagliptin, lisinopril.   Preoperative labs reviewed.  Glucose 391. Last hgbA1c was 12.8 on 06/14/15 (which corresponds to an average daily blood glucose of 321).  (-Dr. Sherryll Burger notes on lab results that DM is uncontrolled, control is worsening and pt needs to see endocrinologist.)   EKG 01/22/2015: NSR. LAD. When compared with ECG of 13-Jul-2014 16:45, Questionable change in QRS axis  Pt reports she saw a cardiologist at [facility] 2-3 years ago for lower leg edema; testing was normal, told she did not need to f/u with cardiology. Records have been requested.   Echo 09/20/2013: 1. Normal LV systolic function EF 60% 2. Mild LVH  Nuclear stress test 09/20/2013: 1. Normal treadmill ECG without evidence of ischemia or dysrhythmia 2. Mild global LV dysfunction, EF 47% 3. Normal myocardial perfusion images at rest and peak stress without evidence of ischemia  Reviewed case with Dr. Jacklynn Bue. Left voicemail for Shanda Bumps in Dr. Cassandria Santee office with DM status. Also spoke with pt. She has not seen an endocrinologist. I asked her to get in touch with Dr. Sherryll Burger about her DM to see if anything can be done to improve control prior to surgery. I also explained to pt that if her blood glucose is higher than 200 DOS, it is likely her surgery would be cancelled.   Pt will need further assessment DOS by her assigned anesthesiologist.   Rica Mast, FNP-BC [facility]Anesthesiology Phone: [phone] 07/22/2015 4:39 PM

## 2015-07-22 NOTE — Progress Notes (Addendum)
I called [facility] in [place], Lasix dose is  a day, patient last picked prescription up Feb 2016 - 30 days worth.  Lasix is not listed on medication list at Dr Sherryll Burger office visit- 06/03/2015

## 2015-07-23 ENCOUNTER — Telehealth: Payer: Self-pay

## 2015-07-23 DIAGNOSIS — E119 Type 2 diabetes mellitus without complications: Secondary | ICD-10-CM

## 2015-07-23 DIAGNOSIS — Z794 Long term (current) use of insulin: Principal | ICD-10-CM

## 2015-07-23 MED ORDER — CEFAZOLIN SODIUM-DEXTROSE 2-3 GM-% IV SOLR: 2.0000 g | INTRAVENOUS | Status: DC

## 2015-07-23 NOTE — Telephone Encounter (Signed)
Patient called in stating the NeuroSurgeon will not perform her back surgery due her Sugar has been around 283. He wants her to see an Endocrinologist to get her Sugars under control Patient is requesting this be done ASAP please!

## 2015-07-24 ENCOUNTER — Encounter (HOSPITAL_COMMUNITY): Admission: RE | Payer: Self-pay | Source: Ambulatory Visit

## 2015-07-24 ENCOUNTER — Ambulatory Visit (HOSPITAL_COMMUNITY)
Admission: RE | Admit: 2015-07-24 | Payer: Commercial Managed Care - HMO | Source: Ambulatory Visit | Admitting: Neurosurgery

## 2015-07-24 SURGERY — LUMBAR LAMINECTOMY/DECOMPRESSION MICRODISCECTOMY 2 LEVELS
Anesthesia: General | Laterality: Left

## 2015-07-24 NOTE — Telephone Encounter (Signed)
Routed note to Dr. Sherryll Burger asking for a referral to be ordered to see an Endocrinologist per patient request

## 2015-07-24 NOTE — Telephone Encounter (Signed)
Referral has been ordered and routed to [facility] for Scheduling

## 2015-07-25 ENCOUNTER — Encounter: Payer: Self-pay | Admitting: Family Medicine

## 2015-08-11 ENCOUNTER — Other Ambulatory Visit: Payer: Self-pay | Admitting: Family Medicine

## 2015-08-20 ENCOUNTER — Encounter: Payer: Self-pay | Admitting: Family Medicine

## 2015-08-20 ENCOUNTER — Ambulatory Visit (INDEPENDENT_AMBULATORY_CARE_PROVIDER_SITE_OTHER): Payer: Commercial Managed Care - HMO | Admitting: Family Medicine

## 2015-08-20 VITALS — BP 110/64 | HR 101 | Temp 98.2°F | Resp 16 | Ht 66.0 in | Wt 218.2 lb

## 2015-08-20 DIAGNOSIS — E1151 Type 2 diabetes mellitus with diabetic peripheral angiopathy without gangrene: Secondary | ICD-10-CM

## 2015-08-20 DIAGNOSIS — E1165 Type 2 diabetes mellitus with hyperglycemia: Secondary | ICD-10-CM | POA: Insufficient documentation

## 2015-08-20 DIAGNOSIS — M5126 Other intervertebral disc displacement, lumbar region: Secondary | ICD-10-CM | POA: Diagnosis not present

## 2015-08-20 DIAGNOSIS — E669 Obesity, unspecified: Secondary | ICD-10-CM

## 2015-08-20 DIAGNOSIS — E1159 Type 2 diabetes mellitus with other circulatory complications: Secondary | ICD-10-CM

## 2015-08-20 DIAGNOSIS — F3111 Bipolar disorder, current episode manic without psychotic features, mild: Secondary | ICD-10-CM | POA: Diagnosis not present

## 2015-08-20 DIAGNOSIS — T732XXA Exhaustion due to exposure, initial encounter: Secondary | ICD-10-CM

## 2015-08-20 DIAGNOSIS — IMO0002 Reserved for concepts with insufficient information to code with codable children: Secondary | ICD-10-CM | POA: Insufficient documentation

## 2015-08-20 DIAGNOSIS — I70209 Unspecified atherosclerosis of native arteries of extremities, unspecified extremity: Secondary | ICD-10-CM

## 2015-08-20 DIAGNOSIS — M5116 Intervertebral disc disorders with radiculopathy, lumbar region: Secondary | ICD-10-CM | POA: Insufficient documentation

## 2015-08-20 DIAGNOSIS — M5416 Radiculopathy, lumbar region: Secondary | ICD-10-CM | POA: Insufficient documentation

## 2015-08-20 LAB — GLUCOSE, POCT (MANUAL RESULT ENTRY): POC GLUCOSE: 188 mg/dL — AB (ref 70–99)

## 2015-08-20 MED ORDER — OXYCODONE-ACETAMINOPHEN 10-325 MG PO TABS: 1.0000 | ORAL_TABLET | Freq: Two times a day (BID) | ORAL | Status: DC

## 2015-08-21 ENCOUNTER — Encounter: Payer: Self-pay | Admitting: Family Medicine

## 2015-08-21 DIAGNOSIS — Z23 Encounter for immunization: Secondary | ICD-10-CM | POA: Insufficient documentation

## 2015-08-21 LAB — FRUCTOSAMINE: FRUCTOSAMINE: 371 umol/L — AB (ref 0–285)

## 2015-08-21 LAB — TSH: TSH: 2.16 u[IU]/mL (ref 0.450–4.500)

## 2015-08-21 NOTE — Progress Notes (Signed)
Name: Debra [name]   MRN: [id number]    DOB: 06-19-1961   Date:08/21/2015       Progress Note  Subjective  Chief Complaint  Chief Complaint  Patient presents with  . Diabetes    pt here for pre-op clearance for back surgery    HPI  Problem preoperative evaluation for lumbar disc disease  Patient suffered significant injury to her lumbar spine in June of this year when she was attempting to keep the patient from falling. We'll try to lift the patient she discretely heard a pop in her back immediately experiencing excruciating pain. She was evaluated by verrucous composition and finally by neurosurgeon. MRI has shown disc disease at L4-5 and L5-S1 disc area. She has a pain which is significant and is not significantly improved with OxyContin 10 mg which she takes only once or twice per day current time. She is afraid of further herniation and irreparable damage.  Fatigue  Patient complains intermittently of fatigue particularly since her injury.  She has not been taking very strong doses of pain medications. There's been significant weight loss which she is less active. In addition she does experience some polyuria associated with diabetes mellitus.    Past Medical History  Diagnosis Date  . Hypertension   . Insomnia   . Anxiety   . Depression   . Hyperlipidemia   . Obesity   . Tobacco abuse   . Family history of adverse reaction to anesthesia     Mother - BP drops  . Diabetes mellitus without complication     Type II  . Head injury, closed, with brief LOC 2011ish  . Seasonal allergies   . Arthritis     Social History  Substance Use Topics  . Smoking status: Current Every Day Smoker -- 1.00 packs/day for 24 years  . Smokeless tobacco: Not on file  . Alcohol Use: No     Comment: occasional- rare     Current outpatient prescriptions:  .  ACCU-CHEK SMARTVIEW test strip, , Disp: , Rfl:  .  Alcohol Swabs (ALCOHOL PREP) PADS, , Disp: , Rfl:  .  amLODipine (NORVASC) 10 MG  tablet, TAKE 1 TABLET (10 MG TOTAL) BY MOUTH DAILY., Disp: 90 tablet, Rfl: 0 .  aspirin EC 81 MG tablet, Take 81 mg by mouth daily., Disp: , Rfl:  .  calcium-vitamin D (OSCAL WITH D) 500-200 MG-UNIT per tablet, Take 1 tablet by mouth daily. , Disp: , Rfl:  .  folic acid (FOLVITE) 400 MCG tablet, Take 800 mcg by mouth daily. , Disp: , Rfl:  .  furosemide (LASIX) 20 MG tablet, , Disp: , Rfl:  .  glipiZIDE (GLUCOTROL XL) 10 MG 24 hr tablet, Take 1 tablet (10 mg total) by mouth daily with breakfast., Disp: 90 tablet, Rfl: 0 .  ibuprofen (ADVIL,MOTRIN) 800 MG tablet, Take 1 tablet (800 mg total) by mouth every 8 (eight) hours as needed for moderate pain., Disp: 15 tablet, Rfl: 0 .  Insulin Glargine (LANTUS SOLOSTAR) 100 UNIT/ML Solostar Pen, Inject 40 Units into the skin daily at 10 pm., Disp: 4 pen, Rfl: PRN .  linagliptin (TRADJENTA) 5 MG TABS tablet, Take 1 tablet (5 mg total) by mouth daily., Disp: 90 tablet, Rfl: 0 .  lisinopril (PRINIVIL,ZESTRIL) 10 MG tablet, TAKE 1 TABLET (10 MG TOTAL) BY MOUTH DAILY., Disp: 90 tablet, Rfl: 0 .  Oxycodone HCl 10 MG TABS, , Disp: , Rfl:  .  oxyCODONE-acetaminophen (PERCOCET) 10-325 MG per tablet, Take  1 tablet by mouth every 12 (twelve) hours., Disp: 30 tablet, Rfl: 0 .  PARoxetine (PAXIL) 40 MG tablet, TAKE ONE TABLET BY MOUTH EVERY DAY, Disp: 90 tablet, Rfl: 0 .  Vitamins A & D 5000-400 UNITS CAPS, , Disp: , Rfl:   Allergies  Allergen Reactions  . Metformin And Related Diarrhea  . Benadryl [Diphenhydramine Hcl] Swelling    blistering  . Cholesterol Other (See Comments)    meds cause her to pass out  . Depakote Er [Divalproex Sodium Er] Other (See Comments)    seizures  . Dilaudid [Hydromorphone Hcl] Other (See Comments)    seizures  . Neosporin [Neomycin-Bacitracin Zn-Polymyx] Swelling    blistering  . Singulair [Montelukast Sodium] Other (See Comments)    Asthma attacks  . Statins Other (See Comments)  . Sulfa Antibiotics Hives  . Victoza  [Liraglutide] Other (See Comments)    pancreatitis    Review of Systems  Constitutional: Positive for malaise/fatigue. Negative for fever, chills and weight loss.  HENT: Negative.  Negative for congestion, hearing loss, sore throat and tinnitus.   Eyes: Negative.  Negative for blurred vision, double vision and redness.  Respiratory: Negative.  Negative for cough, hemoptysis and shortness of breath.   Cardiovascular: Positive for leg swelling. Negative for chest pain, palpitations, orthopnea and claudication.  Gastrointestinal: Negative for heartburn, nausea, vomiting, diarrhea, constipation and blood in stool.  Genitourinary: Positive for frequency. Negative for dysuria, urgency and hematuria.  Musculoskeletal: Positive for back pain. Negative for myalgias, joint pain, falls and neck pain.  Skin: Negative for itching.  Neurological: Positive for tingling, sensory change, focal weakness and weakness. Negative for dizziness, tremors, seizures, loss of consciousness and headaches.  Endo/Heme/Allergies: Does not bruise/bleed easily.  Psychiatric/Behavioral: Negative for depression and substance abuse. The patient is not nervous/anxious and does not have insomnia.      Objective  Filed Vitals:   08/20/15 1439  BP: 110/64  Pulse: 101  Temp: 98.2 F (36.8 C)  Resp: 16  Height: 5\' 6"  (1.676 m)  Weight: 218 lb 4 oz (98.998 kg)  SpO2: 96%     Physical Exam  Constitutional: She is oriented to person, place, and time and well-developed, well-nourished, and in no distress.  Moderately obese and in  HENT:  Head: Normocephalic.  Eyes: EOM are normal. Pupils are equal, round, and reactive to light.  Neck: Normal range of motion. No thyromegaly present.  Cardiovascular: Normal rate, regular rhythm, normal heart sounds and intact distal pulses.   No murmur heard. Pulmonary/Chest: Effort normal and breath sounds normal.  Abdominal: Soft. Bowel sounds are normal. She exhibits no distension.  There is no tenderness.  Musculoskeletal: Normal range of motion. She exhibits tenderness (MARKEDLY TENDER TO PALPATION ALONG THE along the lower lumbar sacral segments typically on the left. Straight leg raising is positive at about 30 on the left 60 on the right. DTRs are diminished and there is no Babinski present.). She exhibits no edema.  Neurological: She is alert and oriented to person, place, and time. No cranial nerve deficit. Gait normal.  Skin: Skin is warm and dry. No rash noted.  Psychiatric: Memory and affect normal.      Assessment & Plan  1. Uncontrolled diabetes mellitus type 2 with atherosclerosis of arteries of extremities With a hemoglobin A1c of 11.6 on 96 the patient is not a suitable candidate for other that emergency surgery at this time and opinion. I had a lengthy discussion with the patient and her husband regarding  the possible complications of secondary infection MRSA MRSA abscess and generalized septicemia SIRS. - POCT Glucose (CBG) - Fructosamine - TSH  2. Lumbar herniated disc Trial of OxyContin 20 mg twice a day for pain control she is to return in 2-3 weeks to see Dr. Clelia Croft her primary care provider- Elevated toilet seat  3. Obesity  long-standing problem followed by her PCP   4. Bipolar disorder, current episode manic without psychotic features, mild Followed by psychiatry

## 2015-08-27 ENCOUNTER — Telehealth: Payer: Self-pay | Admitting: Family Medicine

## 2015-08-27 ENCOUNTER — Emergency Department (HOSPITAL_COMMUNITY)
Admission: EM | Admit: 2015-08-27 | Discharge: 2015-08-27 | Discharge status: Against Medical Advice | Payer: Commercial Managed Care - HMO | Attending: Emergency Medicine | Admitting: Emergency Medicine

## 2015-08-27 ENCOUNTER — Encounter (HOSPITAL_COMMUNITY): Payer: Self-pay

## 2015-08-27 DIAGNOSIS — E119 Type 2 diabetes mellitus without complications: Secondary | ICD-10-CM | POA: Diagnosis not present

## 2015-08-27 DIAGNOSIS — R064 Hyperventilation: Secondary | ICD-10-CM | POA: Diagnosis not present

## 2015-08-27 DIAGNOSIS — I10 Essential (primary) hypertension: Secondary | ICD-10-CM | POA: Diagnosis not present

## 2015-08-27 DIAGNOSIS — M549 Dorsalgia, unspecified: Secondary | ICD-10-CM | POA: Diagnosis not present

## 2015-08-27 DIAGNOSIS — E669 Obesity, unspecified: Secondary | ICD-10-CM | POA: Diagnosis not present

## 2015-08-27 LAB — CBG MONITORING, ED: GLUCOSE-CAPILLARY: 113 mg/dL — AB (ref 65–99)

## 2015-08-27 NOTE — ED Notes (Signed)
Pt hyperventilating while waiting for triage, pt tearful and states she is in pain. Pt coached into taking slow deep breaths. Pt Vital signs taken by this RN, pt alert and oriented. Offered pain meds when triaged, pt states "even the strong pain medicine doesn't help."

## 2015-08-27 NOTE — ED Notes (Signed)
No answer when patient called 3 times.

## 2015-08-27 NOTE — ED Notes (Signed)
Dr. Jeral Fruit sent pt to ED for herniated disc problems L4-5.  Pt has had back pain since 05-25-15, no relief from pain meds.  Pt in severe pain, crying inn lobby.

## 2015-08-27 NOTE — Telephone Encounter (Signed)
Patient is requesting a return call to discuss surgery

## 2015-08-28 ENCOUNTER — Emergency Department: Payer: Commercial Managed Care - HMO

## 2015-08-28 ENCOUNTER — Ambulatory Visit (INDEPENDENT_AMBULATORY_CARE_PROVIDER_SITE_OTHER): Payer: Commercial Managed Care - HMO | Admitting: Family Medicine

## 2015-08-28 ENCOUNTER — Emergency Department
Admission: EM | Admit: 2015-08-28 | Discharge: 2015-08-28 | Disposition: A | Discharge status: Home / Self Care | Payer: Commercial Managed Care - HMO | Attending: Internal Medicine | Admitting: Internal Medicine

## 2015-08-28 ENCOUNTER — Encounter: Payer: Self-pay | Admitting: Emergency Medicine

## 2015-08-28 ENCOUNTER — Encounter: Payer: Self-pay | Admitting: Family Medicine

## 2015-08-28 VITALS — HR 116 | Temp 98.5°F | Resp 19 | Ht 66.0 in | Wt 215.4 lb

## 2015-08-28 DIAGNOSIS — M5410 Radiculopathy, site unspecified: Secondary | ICD-10-CM | POA: Insufficient documentation

## 2015-08-28 DIAGNOSIS — M545 Low back pain: Secondary | ICD-10-CM | POA: Diagnosis present

## 2015-08-28 DIAGNOSIS — M5126 Other intervertebral disc displacement, lumbar region: Secondary | ICD-10-CM

## 2015-08-28 DIAGNOSIS — Z7982 Long term (current) use of aspirin: Secondary | ICD-10-CM | POA: Insufficient documentation

## 2015-08-28 DIAGNOSIS — Z23 Encounter for immunization: Secondary | ICD-10-CM

## 2015-08-28 DIAGNOSIS — Z794 Long term (current) use of insulin: Secondary | ICD-10-CM | POA: Diagnosis not present

## 2015-08-28 DIAGNOSIS — E119 Type 2 diabetes mellitus without complications: Secondary | ICD-10-CM | POA: Diagnosis not present

## 2015-08-28 DIAGNOSIS — Z720 Tobacco use: Secondary | ICD-10-CM | POA: Insufficient documentation

## 2015-08-28 DIAGNOSIS — I10 Essential (primary) hypertension: Secondary | ICD-10-CM | POA: Diagnosis not present

## 2015-08-28 DIAGNOSIS — M5127 Other intervertebral disc displacement, lumbosacral region: Secondary | ICD-10-CM | POA: Diagnosis not present

## 2015-08-28 DIAGNOSIS — Z79899 Other long term (current) drug therapy: Secondary | ICD-10-CM | POA: Diagnosis not present

## 2015-08-28 LAB — URINALYSIS COMPLETE WITH MICROSCOPIC (ARMC ONLY)
BACTERIA UA: NONE SEEN
Bilirubin Urine: NEGATIVE
HGB URINE DIPSTICK: NEGATIVE
Nitrite: NEGATIVE
PROTEIN: NEGATIVE mg/dL
Specific Gravity, Urine: 1.028 (ref 1.005–1.030)
pH: 5 (ref 5.0–8.0)

## 2015-08-28 MED ORDER — ALPRAZOLAM 2 MG PO TABS: 2.0000 mg | ORAL_TABLET | Freq: Three times a day (TID) | ORAL | Status: DC | PRN

## 2015-08-28 MED ORDER — DIAZEPAM 5 MG/ML IJ SOLN
5.0000 mg | Freq: Once | INTRAMUSCULAR | Status: AC
  Administered 2015-08-28: 5 mg via INTRAMUSCULAR
  Filled 2015-08-28: qty 2

## 2015-08-28 MED ORDER — DIAZEPAM 5 MG/ML IJ SOLN
5.0000 mg | Freq: Once | INTRAMUSCULAR | Status: AC
  Administered 2015-08-28: 5 mg via INTRAMUSCULAR

## 2015-08-28 NOTE — Progress Notes (Signed)
Name: Debra [name]   MRN: [id number]    DOB: [birth year]   Date:08/28/2015       Progress Note  Subjective  Chief Complaint  Chief Complaint  Patient presents with  . Acute Visit    Back and legs numb x 2 days  . Diabetes    HPI Patient is here after experiencing sudden onset sharp pain in her right leg. It started yesterday, no obvious inciting factor. She was walking and started noticing severe pain in her right foot and then the right lower back radiating to the right leg. At this time, her right leg feels numb. Of note, pt. Is waiting for medical clearance for lumbar surgery for disc disease at L4-L5 level, which is affecting her left leg.Regarding pain control, she is not taking any opioids (including Percocet and Oxycontin) because they make her sick and do not relieve her pain.  Past Medical History  Diagnosis Date  . Hypertension   . Insomnia   . Anxiety   . Depression   . Hyperlipidemia   . Obesity   . Tobacco abuse   . Family history of adverse reaction to anesthesia     Mother - BP drops  . Diabetes mellitus without complication     Type II  . Head injury, closed, with brief LOC 2011ish  . Seasonal allergies   . Arthritis     Past Surgical History  Procedure Laterality Date  . Cholecystectomy  2013  . Abdominal hysterectomy  2012  . Cesarean section  1992    Family History  Problem Relation Age of Onset  . COPD Mother   . Bipolar disorder Father   . Bipolar disorder Brother   . Diabetes Mellitus II Father   . Diabetes Mellitus II Brother     Social History   Social History  . Marital Status: Married    Spouse Name: N/A  . Number of Children: N/A  . Years of Education: N/A   Occupational History  . Not on file.   Social History Main Topics  . Smoking status: Current Every Day Smoker -- 0.50 packs/day for 24 years    Types: Cigarettes  . Smokeless tobacco: Not on file  . Alcohol Use: No     Comment: occasional- rare  . Drug Use: No  . Sexual  Activity: Not on file   Other Topics Concern  . Not on file   Social History Narrative     Current outpatient prescriptions:  .  ACCU-CHEK SMARTVIEW test strip, , Disp: , Rfl:  .  Alcohol Swabs (ALCOHOL PREP) PADS, , Disp: , Rfl:  .  amLODipine (NORVASC) 10 MG tablet, TAKE 1 TABLET (10 MG TOTAL) BY MOUTH DAILY., Disp: 90 tablet, Rfl: 0 .  aspirin EC 81 MG tablet, Take 81 mg by mouth daily., Disp: , Rfl:  .  calcium-vitamin D (OSCAL WITH D) 500-200 MG-UNIT per tablet, Take 1 tablet by mouth daily. , Disp: , Rfl:  .  folic acid (FOLVITE) 400 MCG tablet, Take 800 mcg by mouth daily. , Disp: , Rfl:  .  furosemide (LASIX) 20 MG tablet, , Disp: , Rfl:  .  glipiZIDE (GLUCOTROL XL) 10 MG 24 hr tablet, Take 1 tablet (10 mg total) by mouth daily with breakfast., Disp: 90 tablet, Rfl: 0 .  Insulin Glargine (LANTUS SOLOSTAR) 100 UNIT/ML Solostar Pen, Inject 40 Units into the skin daily at 10 pm., Disp: 4 pen, Rfl: PRN .  INVOKANA 300 MG TABS tablet, Take  1 tablet by mouth daily., Disp: , Rfl:  .  linagliptin (TRADJENTA) 5 MG TABS tablet, Take 1 tablet (5 mg total) by mouth daily., Disp: 90 tablet, Rfl: 0 .  lisinopril (PRINIVIL,ZESTRIL) 10 MG tablet, TAKE 1 TABLET (10 MG TOTAL) BY MOUTH DAILY., Disp: 90 tablet, Rfl: 0 .  PARoxetine (PAXIL) 40 MG tablet, TAKE ONE TABLET BY MOUTH EVERY DAY, Disp: 90 tablet, Rfl: 0 .  Vitamins A & D 5000-400 UNITS CAPS, , Disp: , Rfl:   Allergies  Allergen Reactions  . Metformin And Related Diarrhea  . Benadryl [Diphenhydramine Hcl] Swelling    blistering  . Cholesterol Other (See Comments)    meds cause her to pass out  . Depakote Er [Divalproex Sodium Er] Other (See Comments)    seizures  . Dilaudid [Hydromorphone Hcl] Other (See Comments)    seizures  . Neosporin [Neomycin-Bacitracin Zn-Polymyx] Swelling    blistering  . Singulair [Montelukast Sodium] Other (See Comments)    Asthma attacks  . Statins Other (See Comments)  . Sulfa Antibiotics Hives  .  Victoza [Liraglutide] Other (See Comments)    pancreatitis   Review of Systems  Musculoskeletal: Positive for back pain.  Neurological: Positive for tingling. Negative for focal weakness.    Objective  Filed Vitals:   08/28/15 1005  Pulse: 116  Temp: 98.5 F (36.9 C)  TempSrc: Oral  Resp: 19  Height:  (1.676 m)  Weight: 215 lb 6.4 oz (97.705 kg)  SpO2: 97%    Physical Exam  Constitutional: She is well-developed, well-nourished, and in no distress.  Musculoskeletal:  Tenderness to palpation over the right lower back, with associated lumbar muscle spasm.SLR test while sitting elicited strong pain in her right  posterior thigh.  Nursing note and vitals reviewed.  Assessment & Plan   1. Need for immunization against influenza  - Flu Vaccine QUAD 36+ mos IM  2. Acute low back pain with radicular symptoms, duration less than 6 weeks  Spoke with Dr. Jeral Fruit (neurosurgery) who is recommending a repeat MRI for evaluation of radicular symptoms on the right side, with the concern for disc herniation. Also discussed with Dr. Mayford Kn[facility] ER) and pt. Will proceed to the ER for pain control. If pt. can have the MRI in the ER, we will review and follow up, otherwise, we can order it as outpatient after discharge from the ER. Patient verbalized understanding with the plan.    Syed Asad [facility] 08/28/2015 10:30 AM

## 2015-08-28 NOTE — ED Notes (Signed)
Ambulates with walker d/t increased pain states pain is now going into both legs

## 2015-08-28 NOTE — ED Notes (Signed)
Presents with lower back pain which radiates into both legs  Hx of herniated disc in past and was told that she may have herniated another disc yesterday . Was told to come to ed for MRI

## 2015-08-28 NOTE — ED Provider Notes (Signed)
[facility] Emergency Department Provider Note ____________________________________________  Time seen: 1500  I have reviewed the triage vital signs and the nursing notes.  HISTORY  Chief Complaint  Back Pain  HPI Debra [name] is a 54 y.o. female reports to the ED with a history of chronic low back pain, with lumbar radicular symptoms on the left side. She is referred to the ED directly by her primary care provider Dr. Clelia Croft, for acute pain management and any emergency Department order for a lumbar MRI. She is previously scheduled for a lumbar discectomy with Dr. Hilda Lias in [place]. That surgery was subsequently canceled due to her elevated A1c at 12.5. She was referred to endocrinology for management of her diabetes. She has lost significant amount of weight since that time, and her average fasting blood sugars are in the 90s in the a.m. and in the 150s in the p.m.She was evaluated today by Dr. Brayton El for acute lumbar radicular symptoms on the right lotion any with onset yesterday. She denies any acute injury, but did report prolonged walking and standing yesterday.  Past Medical History  Diagnosis Date  . Hypertension   . Insomnia   . Anxiety   . Depression   . Hyperlipidemia   . Obesity   . Tobacco abuse   . Family history of adverse reaction to anesthesia     Mother - BP drops  . Diabetes mellitus without complication     Type II  . Head injury, closed, with brief LOC 2011ish  . Seasonal allergies   . Arthritis     Patient Active Problem List   Diagnosis Date Noted  . Need for immunization against influenza 08/28/2015  . Acute low back pain with radicular symptoms, duration less than 6 weeks 08/28/2015  . Need for vaccination 08/21/2015  . Lumbar disc disease with radiculopathy 08/20/2015  . Lumbar herniated disc 08/20/2015  . Uncontrolled diabetes mellitus 08/20/2015  . Left-sided low back pain with sciatica 06/03/2015  . Abdominal gas  pain 06/03/2015  . Abdominal pain, generalized 06/03/2015  . Affective bipolar disorder 06/03/2015  . Insomnia, persistent 06/03/2015  . BP (high blood pressure) 06/03/2015  . Type 2 diabetes mellitus treated with insulin 06/03/2015  . Leg swelling 06/03/2015  . Extreme obesity 06/03/2015  . Compulsive tobacco user syndrome 06/03/2015  . Candida vaginitis 06/03/2015  . Phlebectasia 06/03/2015    Past Surgical History  Procedure Laterality Date  . Cholecystectomy  2013  . Abdominal hysterectomy  2012  . Cesarean section  1992    Current Outpatient Rx  Name  Route  Sig  Dispense  Refill  . ACCU-CHEK SMARTVIEW test strip                 Dispense as written.   . Alcohol Swabs (ALCOHOL PREP) PADS               . alprazolam (XANAX) 2 MG tablet   Oral   Take 1 tablet (2 mg total) by mouth 3 (three) times daily as needed for sleep.   15 tablet   0   . amLODipine (NORVASC) 10 MG tablet      TAKE 1 TABLET (10 MG TOTAL) BY MOUTH DAILY.   90 tablet   0   . aspirin EC 81 MG tablet   Oral   Take 81 mg by mouth daily.         . calcium-vitamin D (OSCAL WITH D) 500-200 MG-UNIT per tablet  Oral   Take 1 tablet by mouth daily.          . folic acid (FOLVITE) 400 MCG tablet   Oral   Take 800 mcg by mouth daily.          . furosemide (LASIX) 20 MG tablet               . glipiZIDE (GLUCOTROL XL) 10 MG 24 hr tablet   Oral   Take 1 tablet (10 mg total) by mouth daily with breakfast.   90 tablet   0   . Insulin Glargine (LANTUS SOLOSTAR) 100 UNIT/ML Solostar Pen   Subcutaneous   Inject 40 Units into the skin daily at 10 pm.   4 pen   PRN   . INVOKANA 300 MG TABS tablet   Oral   Take 1 tablet by mouth daily.           Dispense as written.   . linagliptin (TRADJENTA) 5 MG TABS tablet   Oral   Take 1 tablet (5 mg total) by mouth daily.   90 tablet   0   . lisinopril (PRINIVIL,ZESTRIL) 10 MG tablet      TAKE 1 TABLET (10 MG TOTAL) BY MOUTH  DAILY.   90 tablet   0   . PARoxetine (PAXIL) 40 MG tablet      TAKE ONE TABLET BY MOUTH EVERY DAY   90 tablet   0   . Vitamins A & D 5000-400 UNITS CAPS                Allergies Metformin and related; Benadryl; Cholesterol; Depakote er; Dilaudid; Neosporin; Singulair; Statins; Sulfa antibiotics; and Victoza  Family History  Problem Relation Age of Onset  . COPD Mother   . Bipolar disorder Father   . Bipolar disorder Brother   . Diabetes Mellitus II Father   . Diabetes Mellitus II Brother    Social History Social History  Substance Use Topics  . Smoking status: Current Every Day Smoker -- 0.50 packs/day for 24 years    Types: Cigarettes  . Smokeless tobacco: None  . Alcohol Use: No     Comment: occasional- rare   Review of Systems  Constitutional: Negative for fever. Eyes: Negative for visual changes. ENT: Negative for sore throat. Cardiovascular: Negative for chest pain. Respiratory: Negative for shortness of breath. Gastrointestinal: Negative for abdominal pain, vomiting and diarrhea. Genitourinary: Negative for dysuria. Musculoskeletal: Postive for back pain with RLE referral Skin: Negative for rash. Neurological: Negative for headaches, focal weakness or numbness. ____________________________________________  PHYSICAL EXAM:  VITAL SIGNS: ED Triage Vitals  Enc Vitals Group     BP 08/28/15 1423 106/62 mmHg     Pulse Rate 08/28/15 1423 75     Resp 08/28/15 1423 18     Temp 08/28/15 1423 98.1 F (36.7 C)     Temp Source 08/28/15 1423 Oral     SpO2 08/28/15 1423 98 %     Weight 08/28/15 1423 214 lb (97.07 kg)     Height 08/28/15 1423 5' 5.5" (1.664 m)     Head Cir --      Peak Flow --      Pain Score 08/28/15 1418 10     Pain Loc --      Pain Edu? --      Excl. in GC? --    Constitutional: Alert and oriented. Well appearing and in no distress. Eyes: Conjunctivae are normal. PERRL. Normal extraocular movements.  ENT   Head: Normocephalic and  atraumatic.   Nose: No congestion/rhinorrhea.   Mouth/Throat: Mucous membranes are moist.   Neck: Supple. No thyromegaly. Hematological/Lymphatic/Immunological: No cervical lymphadenopathy. Cardiovascular: Normal rate, regular rhythm.  Respiratory: Normal respiratory effort. No wheezes/rales/rhonchi. Gastrointestinal: Soft and nontender. No distention. Musculoskeletal: Patient is lying supine with the knees and hips in a flexed position on the exam table. She transitions smoothly from supine to sit without difficulty. Nontender with normal range of motion in all extremities.  Neurologic:  Normal gait without ataxia. Normal speech and language. No gross focal neurologic deficits are appreciated. Skin:  Skin is warm, dry and intact. No rash noted. Psychiatric: Mood and affect are normal. Patient exhibits appropriate insight and judgment. ____________________________________________   LABS (pertinent positives/negatives) Labs Reviewed  URINALYSIS COMPLETEWITH MICROSCOPIC ([facility] ONLY) - Abnormal; Notable for the following:    Color, Urine YELLOW (*)    APPearance HAZY (*)    Glucose, UA >500 (*)    Ketones, ur TRACE (*)    Leukocytes, UA 1+ (*)    Squamous Epithelial / LPF 0-5 (*)    All other components within normal limits  ____________________________________________   RADIOLOGY MRI Lumbar Spine IMPRESSION: No change in the appearance of the lumbar spine since the patient's examination of 07/11/2015.  Left lateral recess protrusion at L5-S1 contacts and slightly deflects the descending left S1 root although the root does not appear compressed.  Mild left lateral recess and mild to moderate left foraminal narrowing L4-5 due to disc. There is mild right foraminal narrowing at this level. ____________________________________________  PROCEDURES  Valium 10 mg IM ____________________________________________  INITIAL IMPRESSION / ASSESSMENT AND PLAN / ED  COURSE  MRI  results & copy to the patient. No indication of acute right nerve root compression. Patient followed Dr. Jeral Fruit and Dr. Clelia Croft as planned. Prescription for Xanax 2 mg tabs #15 is provided. ____________________________________________  FINAL CLINICAL IMPRESSION(S) / ED DIAGNOSES  Final diagnoses:  Radicular syndrome of right leg  HNP (herniated nucleus pulposus), lumbar      Lissa Hoard, PA-C 08/28/15 1652  Emily Filbert, MD 08/29/15 [phone]

## 2015-08-28 NOTE — Telephone Encounter (Signed)
Patient was seen in office today.  

## 2015-08-28 NOTE — Discharge Instructions (Signed)
Herniated Disk A herniated disk occurs when a disk in your spine bulges out too far. This condition is also called a ruptured disk or slipped disk. Your spine (backbone) is made up of bones called vertebrae. Between each pair of vertebrae is an oval disk with a soft, spongy center that acts as a shock absorber when you move. The spongy center is surrounded by a tough outer ring. When you have a herniated disk, the spongy center of the disk bulges out or ruptures through the outer ring. A herniated disk can press on a nerve between your vertebrae and cause pain. A herniated disk can occur anywhere in your back or neck area, but the lower back is the most common spot. CAUSES  In many cases, a herniated disk occurs just from getting older. As you age, the spongy insides of your disks tend to shrink and dry out. A herniated disk can result from gradual wear and tear. Injury or sudden strain can also cause a herniated disk.  RISK FACTORS Aging is the main risk factor for a herniated disk. Other risk factors include:  Being a man between the ages of 50 and 64 years.  Having a job that requires heavy lifting, bending, or twisting.  Having a job that requires long hours of driving.  Not getting enough exercise.  Being overweight.  Smoking. SIGNS AND SYMPTOMS  Signs and symptoms depend on which disk is herniated.  For a herniated disk in the lower back, you may have sharp pain in:  One part of your leg, hip, or buttocks.  The back of your calf.  The top or sole of your foot (sciatica).   For a herniated disk in the neck, you may feel pain:  When you move your neck.  Near or over your shoulder blade.  That moves to your upper arm, forearm, or fingers.   You may also have muscle weakness. It may be hard to:  Lift your leg or arm.  Stand on your toes.  Squeeze tightly with one of your hands.  Other symptoms can include:  Numbness or tingling in the affected areas of your  body.  Loss of bladder or bowel control. This is a rare but serious sign of a severe herniated disk in the lower back. DIAGNOSIS  Your health care provider will do a physical exam. During this exam, you may have to move certain body parts or assume various positions. For example, your health care provider may do the straight-leg test. This is a good way to test for a herniated disk in your lower back. In this test, the health care provider lifts your leg while you lie on your back. This is to see if you feel pain down your leg. Your health care provider will also check for numbness or loss of feeling.  Your health care provider will also check your:  Reflexes.  Muscle strength.  Posture.  Other tests may be done to help in making a diagnosis. These may include:  An X-ray of the spine to rule out other causes of back pain.   Other imaging studies, such as an MRI or CT scan. This is to check whether the herniated disk is pressing on your spinal canal.  Electromyography (EMG). This test checks the nerves that control muscles. It is sometimes used to identify the specific area of nerve involvement.  TREATMENT  In many cases, herniated disk symptoms go away over a period of days or weeks. You will most  likely be free of symptoms in 3-4 months. Treatment may include the following:  The initial treatment for a herniated disk is ashort period of rest.  Bed rest is often limited to 1 or 2 days. Resting for too long delays recovery.  If you have a herniated disk in your lower back, you should avoid sitting as much as possible because sitting increases pressure on the disk.  Medicines. These may include:   Nonsteroidal anti-inflammatory drugs (NSAIDs).  Muscle relaxants for back spasms.  Narcotic pain medicine if your pain is very bad.   Steroid injections. You may need these along the involved nerve root to help control pain. The steroid is injected in the area of the herniated disk.  It helps by reducing swelling around the disk.  Physical therapy. This may include exercises to strengthen the muscles that help support your spine.   You may need surgery if other treatments do not work.  HOME CARE INSTRUCTIONS Follow all your health care provider's instructions. These may include:  Take all medicines as directed by your health care provider.  Rest for 2 days and then start moving.  Do not sit or stand for long periods of time.  Maintain good posture when sitting and standing.  Avoid movements that cause pain, such as bending or lifting.  When you are able to start lifting things again:  [place] with your knees.  Keep your back straight.  Hold heavy objects close to your body.  If you are overweight, ask your health care provider to help you start a weight-loss program.  When you are able to start exercising, ask your health care provider how much and what type of exercise is best for you.  Work with a physical therapist on stretching and strengthening exercises for your back.  Do not wear high-heeled shoes.  Do not sleep on your belly.  Do not smoke.  Keep all follow-up visits as directed by your health care provider. SEEK MEDICAL CARE IF:  You have back or neck pain that is not getting better after 4 weeks.  You have very bad pain in your back or neck.  You develop numbness, tingling, or weakness along with pain. SEEK IMMEDIATE MEDICAL CARE IF:   You have numbness, tingling, or weakness that makes you unable to use your arms or legs.  You lose control of your bladder or bowels.  You have dizziness or fainting.  You have shortness of breath.  MAKE SURE YOU:   Understand these instructions.  Will watch your condition.  Will get help right away if you are not doing well or get worse. Document Released: 11/20/2000 Document Revised: 04/09/2014 Document Reviewed: 10/27/2013 [facility] Patient Information 2015 [place], [place]. This information  is not intended to replace advice given to you by your health care provider. Make sure you discuss any questions you have with your health care provider.  Lumbosacral Radiculopathy Lumbosacral radiculopathy is a pinched nerve or nerves in the low back (lumbosacral area). When this happens you may have weakness in your legs and may not be able to stand on your toes. You may have pain going down into your legs. There may be difficulties with walking normally. There are many causes of this problem. Sometimes this may happen from an injury, or simply from arthritis or boney problems. It may also be caused by other illnesses such as diabetes. If there is no improvement after treatment, further studies may be done to find the exact cause. DIAGNOSIS  X-rays  may be needed if the problems become long standing. Electromyograms may be done. This study is one in which the working of nerves and muscles is studied. HOME CARE INSTRUCTIONS   Applications of ice packs may be helpful. Ice can be used in a plastic bag with a towel around it to prevent frostbite to skin. This may be used every 2 hours for 20 to 30 minutes, or as needed, while awake, or as directed by your caregiver.  Only take over-the-counter or prescription medicines for pain, discomfort, or fever as directed by your caregiver.  If physical therapy was prescribed, follow your caregiver's directions. SEEK IMMEDIATE MEDICAL CARE IF:   You have pain not controlled with medications.  You seem to be getting worse rather than better.  You develop increasing weakness in your legs.  You develop loss of bowel or bladder control.  You have difficulty with walking or balance, or develop clumsiness in the use of your legs.  You have a fever. MAKE SURE YOU:   Understand these instructions.  Will watch your condition.  Will get help right away if you are not doing well or get worse. Document Released: 11/23/2005 Document Revised: 02/15/2012  Document Reviewed: 07/13/2008 [facility] Patient Information 2015 [place], [place]. This information is not intended to replace advice given to you by your health care provider. Make sure you discuss any questions you have with your health care provider.   Take the prescription meds as directed. Follow-up with Dr. Sherryll Burger and Dr. Jeral Fruit as scheduled.

## 2015-09-02 ENCOUNTER — Telehealth: Payer: Self-pay | Admitting: Family Medicine

## 2015-09-02 NOTE — Telephone Encounter (Signed)
Pt would like a call back regarding her surgery?

## 2015-09-03 ENCOUNTER — Other Ambulatory Visit: Payer: Self-pay | Admitting: Neurosurgery

## 2015-09-03 NOTE — Telephone Encounter (Signed)
Please return her call. She states she needs to go back to work. She seems frustrated and needs someone to call her back.

## 2015-09-04 NOTE — Telephone Encounter (Signed)
Routed to Dr. Shah for advice  

## 2015-09-05 NOTE — Pre-Procedure Instructions (Addendum)
Debra [name]  09/05/2015     Your procedure is scheduled on Tuesday , October  4.  Report to [facility] Admitting at 11:30 A.M.               Your surgery is scheduled for 2:30P.M.   Call this number if you have problems the morning of surgery: [phone]                 For any other questions, please call [phone], Monday - Friday 8 AM - 4 PM.    Remember:  Do not eat food or drink liquids after midnight Monday, October 3 .  Take these medicines the morning of surgery with A SIP OF WATER: amLODipine (NORVASC), PARoxetine (PAXIL).                  Stop taking Aspirin and Vitamins on Monday, October 3.                     How to Manage Your Diabetes Before Surgery                                                                                                                                                                                                                                   What do I do about my diabetes medications?   Do not take oral diabetes medicines (pills) the morning of surgery.     THE NIGHT BEFORE SURGERY, take 36  units of Lantus Insulin.    THE MORNING OF SURGERY, take 0 units of Insulin.    Do not take other diabetes injectables the day of surgery including Byetta, Victoza, Bydureon, and Trulicity.   Why is it important to control my blood sugar before and after surgery?    Improving blood sugar levels before and after surgery helps healing and can limit problems.  A way of improving blood sugar control is eating a healthy diet by:  - Eating less sugar and carbohydrates  - Increasing activity/exercise  - Talk with your doctor about reaching your blood sugar goals  High blood sugars (greater than 180 mg/dL) can raise your risk of infections and slow down your recovery so you will need to focus on controlling your diabetes during the weeks before surgery.  Make sure that the doctor who takes care of your diabetes knows about  your planned surgery including  the date and location.  How do I manage my blood sugars before surgery?   Check your blood sugar at least 4 times a day, 2 days before surgery to make sure that they are not too high or low.   Check your blood sugar the morning of your surgery when you wake up and every 2               hours until you get to the Short-Stay unit.  If your blood sugar is less than 70 mg/dL, you will need to treat for low blood sugar by:  Treat a low blood sugar (less than 70 mg/dL) with 1/2 cup of clear juice (cranberry or apple), 4 glucose tablets, OR glucose gel.  Recheck blood sugar in 15 minutes after treatment (to make sure it is greater than 70 mg/dL).  If blood sugar is not greater than 70 mg/dL on re-check, call [phone] for further instructions.   Report your blood sugar to the Pre Op  nurse when you get to Pre OP.   Do not take other diabetes injectables the day of surgery including Byetta, Victoza, Bydureon, and Trulicity.      Do not wear jewelry, make-up or nail polish.   Do not wear lotions, powders, or perfumes.    Do not shave 48 hours prior to surgery.     Do not bring valuables to the hospital.   [facility] is not responsible for any belongings or valuables.  Contacts, dentures or bridgework may not be worn into surgery.  Leave your suitcase in the car.  After surgery it may be brought to your room.  For patients admitted to the hospital, discharge time will be determined by your treatment team.  Patients discharged the day of surgery will not be allowed to drive home.   Name and phone number of your driver:   -  Special instructions:  Review  [facility] - Preparing For Surgery.  Please read over the following fact sheets that you were given. Pain Booklet, Coughing and Deep Breathing and Surgical Site Infection Prevention

## 2015-09-06 ENCOUNTER — Encounter (HOSPITAL_COMMUNITY): Payer: Self-pay

## 2015-09-06 ENCOUNTER — Encounter (HOSPITAL_COMMUNITY)
Admission: RE | Admit: 2015-09-06 | Discharge: 2015-09-06 | Disposition: A | Discharge status: Home / Self Care | Payer: Commercial Managed Care - HMO | Source: Ambulatory Visit | Attending: Neurosurgery | Admitting: Neurosurgery

## 2015-09-06 DIAGNOSIS — M5126 Other intervertebral disc displacement, lumbar region: Secondary | ICD-10-CM | POA: Insufficient documentation

## 2015-09-06 DIAGNOSIS — Z01818 Encounter for other preprocedural examination: Secondary | ICD-10-CM | POA: Diagnosis not present

## 2015-09-06 LAB — BASIC METABOLIC PANEL
Anion gap: 10 (ref 5–15)
BUN: 16 mg/dL (ref 6–20)
CHLORIDE: 106 mmol/L (ref 101–111)
CO2: 24 mmol/L (ref 22–32)
CREATININE: 0.68 mg/dL (ref 0.44–1.00)
Calcium: 9.8 mg/dL (ref 8.9–10.3)
GFR calc Af Amer: >60 mL/min (ref >60)
GFR calc non Af Amer: >60 mL/min (ref >60)
GLUCOSE: 138 mg/dL — AB (ref 65–99)
POTASSIUM: 4.2 mmol/L (ref 3.5–5.1)
SODIUM: 140 mmol/L (ref 135–145)

## 2015-09-06 LAB — CBC
HEMATOCRIT: 46.2 % — AB (ref 36.0–46.0)
Hemoglobin: 15.8 g/dL — ABNORMAL HIGH (ref 12.0–15.0)
MCH: 31.9 pg (ref 26.0–34.0)
MCHC: 34.2 g/dL (ref 30.0–36.0)
MCV: 93.1 fL (ref 78.0–100.0)
PLATELETS: 269 10*3/uL (ref 150–400)
RBC: 4.96 MIL/uL (ref 3.87–5.11)
RDW: 12.7 % (ref 11.5–15.5)
WBC: 9.9 10*3/uL (ref 4.0–10.5)

## 2015-09-06 LAB — GLUCOSE, CAPILLARY: GLUCOSE-CAPILLARY: 158 mg/dL — AB (ref 65–99)

## 2015-09-06 LAB — SURGICAL PCR SCREEN
MRSA, PCR: NEGATIVE
STAPHYLOCOCCUS AUREUS: NEGATIVE

## 2015-09-06 NOTE — Progress Notes (Signed)
There is a note on patients chart from Dr Tedd Sias, patient's endocrinologist, that states that patient is to have a A!C drawn on 10/3 and to follow up with Dr Tedd Sias on 10/4.  Patient asked if we could draw A1C today, since we are drawing blood anyway.  I called Dr Tedd Sias office and Shanda Bumps returned my call and said that patient does not need another A1C, that she had one this month and that her insurance will not pay for it.  I read Dr Pricilla Handler note to Shanda Bumps and she spoke with Dr Tedd Sias and reported to me that the patient does not need another A1C.  I asked Shanda Bumps ,  for a new note from Dr Tedd Sias stating that, I also faxed the request to her office.  I will have anesthesia  PA follow up.

## 2015-09-06 NOTE — Progress Notes (Signed)
Anesthesia Chart Review:  Pt is 54 year old female scheduled for left L4-5 foraminotomy/ left L5-S1 diskectomy on 09/10/2015 with Dr. Jeral Fruit.   Pt was originally scheduled for this surgery in August but it was cancelled due to uncontrolled diabetes. Pt has since seen an endocrinologist, Dr. Carlena Sax, (see care everywhere) and has better control of diabetes and endocrinology clearance for surgery (telephone encounter dated 09/03/15).   PMH includes: HTN, DM, hyperlipidemia, closed head injury (2011). Current smoker. BMI 35.  Medications include: amlodipine, ASA, lasix, glipizide, lantus, invokana, lisinopril.   Preoperative labs reviewed. Glucose 138. Last hgbA1c was 11.6 on 08/13/15, down from 12.8 on 06/14/15.  EKG 01/22/2015: NSR. LAD. When compared with ECG of 07/13/14, Questionable change in QRS axis  Echo 09/20/2013 Gavin Potters): 1. Normal LV systolic function EF 60% 2. Mild LVH  Nuclear stress test 09/20/2013 Gavin Potters): 1. Normal treadmill ECG without evidence of ischemia or dysrhythmia 2. Mild global LV dysfunction, EF 47% 3. Normal myocardial perfusion images at rest and peak stress without evidence of ischemia  Pt reports she saw a cardiologist at [facility] 2-3 years ago for lower leg edema; testing was normal, told she did not need to f/u with cardiology.   If blood sugar acceptable DOS, I anticipate pt can proceed as scheduled.   Rica Mast, FNP-BC [facility]/Anesthesiology Phone: [phone] 09/06/2015 1:11 PM

## 2015-09-06 NOTE — Telephone Encounter (Signed)
Patient is scheduled for surgery on October 4. She has been cleared by endocrinology.

## 2015-09-09 ENCOUNTER — Ambulatory Visit: Payer: Commercial Managed Care - HMO | Admitting: Family Medicine

## 2015-09-09 MED ORDER — CEFAZOLIN SODIUM-DEXTROSE 2-3 GM-% IV SOLR
2.0000 g | INTRAVENOUS | Status: AC
  Administered 2015-09-10: 2 g via INTRAVENOUS
  Filled 2015-09-09: qty 50

## 2015-09-09 NOTE — H&P (Signed)
Debra [name] is an 54 y.o. female.   Chief Complaint: left leg pain HPI: patient seen in my office with lumbar pain with radiation to the left leg which started when she fell at work. Conservative treatment has not helped her. Had two lumbar mri .she has some mild pain in the right leg .  Past Medical History  Diagnosis Date  . Hypertension   . Insomnia   . Anxiety   . Depression   . Hyperlipidemia   . Obesity   . Tobacco abuse   . Family history of adverse reaction to anesthesia     Mother - BP drops  . Diabetes mellitus without complication     Type II  . Head injury, closed, with brief LOC 2011ish  . Seasonal allergies   . Arthritis     Past Surgical History  Procedure Laterality Date  . Cholecystectomy  2013  . Abdominal hysterectomy  2012  . Cesarean section  1992    Family History  Problem Relation Age of Onset  . COPD Mother   . Bipolar disorder Father   . Bipolar disorder Brother   . Diabetes Mellitus II Father   . Diabetes Mellitus II Brother    Social History:  reports that she has been smoking Cigarettes.  She has a 12 pack-year smoking history. She does not have any smokeless tobacco history on file. She reports that she does not drink alcohol or use illicit drugs.  Allergies:  Allergies  Allergen Reactions  . Metformin And Related Diarrhea  . Benadryl [Diphenhydramine Hcl] Swelling    blistering  . Cholesterol Other (See Comments)    meds cause her to pass out  . Depakote Er [Divalproex Sodium Er] Other (See Comments)    seizures  . Dilaudid [Hydromorphone Hcl] Other (See Comments)    seizures  . Neosporin [Neomycin-Bacitracin Zn-Polymyx] Swelling    blistering  . Singulair [Montelukast Sodium] Other (See Comments)    Asthma attacks  . Statins Other (See Comments)  . Sulfa Antibiotics Hives  . Victoza [Liraglutide] Other (See Comments)    pancreatitis    No prescriptions prior to admission    No results found for this or any previous visit  (from the past 48 hour(s)). No results found.  Review of Systems  Constitutional: Negative.   HENT: Negative.   Eyes: Negative.   Respiratory: Negative.   Cardiovascular: Negative.   Gastrointestinal: Negative.   Genitourinary: Negative.   Musculoskeletal: Positive for back pain.  Skin: Negative.   Neurological: Positive for focal weakness.  Endo/Heme/Allergies: Negative.   Psychiatric/Behavioral: Positive for depression.    There were no vitals taken for this visit. Physical Exam hent, nl. Neck, nl. Cv, nl. Lungs, some ronchii. Abdomen, nl. Extremities, nl. NEURO slr positive in the left at 60 degrees. Femoral; stretch maneuver is positive in the left. weakness of the left foot at df with weakness of the quadriceps. Mri shows a left l5s1 hnp and bilateral foraminal narrow at l45 left worse than right  Assessment/Plan Patient to have a left l5s1 discectomy and left l45 laminotomies and foraminotomies. She and her husband are aware of risks and benefits  Izaak Sahr M 09/09/2015, 5:34 PM

## 2015-09-10 ENCOUNTER — Encounter (HOSPITAL_COMMUNITY)
Admission: RE | Disposition: A | Discharge status: Home / Self Care | Payer: Self-pay | Source: Ambulatory Visit | Attending: Neurosurgery

## 2015-09-10 ENCOUNTER — Ambulatory Visit (HOSPITAL_COMMUNITY): Payer: Commercial Managed Care - HMO

## 2015-09-10 ENCOUNTER — Ambulatory Visit (HOSPITAL_COMMUNITY): Payer: Commercial Managed Care - HMO | Admitting: Anesthesiology

## 2015-09-10 ENCOUNTER — Observation Stay (HOSPITAL_COMMUNITY)
Admission: RE | Admit: 2015-09-10 | Discharge: 2015-09-11 | Disposition: A | Discharge status: Home / Self Care | Payer: Commercial Managed Care - HMO | Source: Ambulatory Visit | Attending: Neurosurgery | Admitting: Neurosurgery

## 2015-09-10 ENCOUNTER — Encounter (HOSPITAL_COMMUNITY): Payer: Self-pay | Admitting: Anesthesiology

## 2015-09-10 ENCOUNTER — Ambulatory Visit (HOSPITAL_COMMUNITY): Payer: Commercial Managed Care - HMO | Admitting: Emergency Medicine

## 2015-09-10 DIAGNOSIS — E669 Obesity, unspecified: Secondary | ICD-10-CM | POA: Diagnosis not present

## 2015-09-10 DIAGNOSIS — M1990 Unspecified osteoarthritis, unspecified site: Secondary | ICD-10-CM | POA: Insufficient documentation

## 2015-09-10 DIAGNOSIS — M5126 Other intervertebral disc displacement, lumbar region: Principal | ICD-10-CM | POA: Insufficient documentation

## 2015-09-10 DIAGNOSIS — M4807 Spinal stenosis, lumbosacral region: Secondary | ICD-10-CM | POA: Diagnosis not present

## 2015-09-10 DIAGNOSIS — Z6834 Body mass index (BMI) 34.0-34.9, adult: Secondary | ICD-10-CM | POA: Insufficient documentation

## 2015-09-10 DIAGNOSIS — I10 Essential (primary) hypertension: Secondary | ICD-10-CM | POA: Diagnosis not present

## 2015-09-10 DIAGNOSIS — F17210 Nicotine dependence, cigarettes, uncomplicated: Secondary | ICD-10-CM | POA: Diagnosis not present

## 2015-09-10 DIAGNOSIS — M549 Dorsalgia, unspecified: Secondary | ICD-10-CM

## 2015-09-10 DIAGNOSIS — Z7984 Long term (current) use of oral hypoglycemic drugs: Secondary | ICD-10-CM | POA: Insufficient documentation

## 2015-09-10 DIAGNOSIS — E785 Hyperlipidemia, unspecified: Secondary | ICD-10-CM | POA: Insufficient documentation

## 2015-09-10 HISTORY — PX: LUMBAR LAMINECTOMY/DECOMPRESSION MICRODISCECTOMY: SHX5026

## 2015-09-10 LAB — GLUCOSE, CAPILLARY
GLUCOSE-CAPILLARY: 158 mg/dL — AB (ref 65–99)
GLUCOSE-CAPILLARY: 139 mg/dL — AB (ref 65–99)
GLUCOSE-CAPILLARY: 164 mg/dL — AB (ref 65–99)
Glucose-Capillary: 117 mg/dL — ABNORMAL HIGH (ref 65–99)

## 2015-09-10 SURGERY — LUMBAR LAMINECTOMY/DECOMPRESSION MICRODISCECTOMY 2 LEVELS
Anesthesia: General | Site: Back | Laterality: Left

## 2015-09-10 MED ORDER — PAROXETINE HCL 20 MG PO TABS
40.0000 mg | ORAL_TABLET | Freq: Every day | ORAL | Status: DC
  Administered 2015-09-11: 40 mg via ORAL
  Filled 2015-09-10: qty 2

## 2015-09-10 MED ORDER — FUROSEMIDE 20 MG PO TABS
20.0000 mg | ORAL_TABLET | Freq: Every day | ORAL | Status: DC
  Administered 2015-09-10 – 2015-09-11 (×2): 20 mg via ORAL
  Filled 2015-09-10 (×2): qty 1

## 2015-09-10 MED ORDER — BUPIVACAINE-EPINEPHRINE (PF) 0.5% -1:200000 IJ SOLN
INTRAMUSCULAR | Status: DC | PRN
Start: 2015-09-10 — End: 2015-09-10
  Administered 2015-09-10: 10 mL via PERINEURAL

## 2015-09-10 MED ORDER — VANCOMYCIN HCL 1000 MG IV SOLR
INTRAVENOUS | Status: AC
  Filled 2015-09-10: qty 1000

## 2015-09-10 MED ORDER — CEFAZOLIN SODIUM 1-5 GM-% IV SOLN
1.0000 g | Freq: Three times a day (TID) | INTRAVENOUS | Status: AC
  Administered 2015-09-10 – 2015-09-11 (×2): 1 g via INTRAVENOUS
  Filled 2015-09-10 (×2): qty 50

## 2015-09-10 MED ORDER — PROPOFOL 10 MG/ML IV BOLUS
INTRAVENOUS | Status: DC | PRN
  Administered 2015-09-10: 100 mg via INTRAVENOUS
  Administered 2015-09-10: 200 mg via INTRAVENOUS

## 2015-09-10 MED ORDER — THROMBIN 5000 UNITS EX SOLR
CUTANEOUS | Status: DC | PRN
  Administered 2015-09-10 (×2): 5000 [IU] via TOPICAL

## 2015-09-10 MED ORDER — SODIUM CHLORIDE 0.9 % IJ SOLN: 3.0000 mL | INTRAMUSCULAR | Status: DC | PRN

## 2015-09-10 MED ORDER — METHYLPREDNISOLONE ACETATE 80 MG/ML IJ SUSP
INTRAMUSCULAR | Status: DC | PRN
  Administered 2015-09-10: 80 mg

## 2015-09-10 MED ORDER — SODIUM CHLORIDE 0.9 % IJ SOLN: 3.0000 mL | Freq: Two times a day (BID) | INTRAMUSCULAR | Status: DC

## 2015-09-10 MED ORDER — GLYCOPYRROLATE 0.2 MG/ML IJ SOLN
INTRAMUSCULAR | Status: DC | PRN
  Administered 2015-09-10 (×2): 0.1 mg via INTRAVENOUS
  Administered 2015-09-10: 0.4 mg via INTRAVENOUS

## 2015-09-10 MED ORDER — FENTANYL CITRATE (PF) 100 MCG/2ML IJ SOLN
25.0000 ug | INTRAMUSCULAR | Status: DC | PRN
  Administered 2015-09-10: 50 ug via INTRAVENOUS

## 2015-09-10 MED ORDER — CANAGLIFLOZIN 300 MG PO TABS
300.0000 mg | ORAL_TABLET | Freq: Every day | ORAL | Status: DC
  Administered 2015-09-10 – 2015-09-11 (×2): 300 mg via ORAL
  Filled 2015-09-10 (×2): qty 300

## 2015-09-10 MED ORDER — MIDAZOLAM HCL 2 MG/2ML IJ SOLN
INTRAMUSCULAR | Status: AC
  Filled 2015-09-10: qty 4

## 2015-09-10 MED ORDER — LISINOPRIL 10 MG PO TABS
10.0000 mg | ORAL_TABLET | Freq: Every day | ORAL | Status: DC
  Administered 2015-09-10 – 2015-09-11 (×2): 10 mg via ORAL
  Filled 2015-09-10 (×2): qty 1

## 2015-09-10 MED ORDER — HEMOSTATIC AGENTS (NO CHARGE) OPTIME
TOPICAL | Status: DC | PRN
  Administered 2015-09-10: 1 via TOPICAL

## 2015-09-10 MED ORDER — PROPOFOL 10 MG/ML IV BOLUS
INTRAVENOUS | Status: AC
  Filled 2015-09-10: qty 20

## 2015-09-10 MED ORDER — ROCURONIUM BROMIDE 100 MG/10ML IV SOLN
INTRAVENOUS | Status: DC | PRN
  Administered 2015-09-10: 50 mg via INTRAVENOUS

## 2015-09-10 MED ORDER — ONDANSETRON HCL 4 MG/2ML IJ SOLN
INTRAMUSCULAR | Status: AC
  Filled 2015-09-10: qty 2

## 2015-09-10 MED ORDER — NEOSTIGMINE METHYLSULFATE 10 MG/10ML IV SOLN
INTRAVENOUS | Status: AC
  Filled 2015-09-10: qty 1

## 2015-09-10 MED ORDER — ACETAMINOPHEN 325 MG PO TABS: 650.0000 mg | ORAL_TABLET | ORAL | Status: DC | PRN

## 2015-09-10 MED ORDER — GLYCOPYRROLATE 0.2 MG/ML IJ SOLN
INTRAMUSCULAR | Status: AC
  Filled 2015-09-10: qty 2

## 2015-09-10 MED ORDER — FENTANYL CITRATE (PF) 100 MCG/2ML IJ SOLN
INTRAMUSCULAR | Status: AC
  Filled 2015-09-10: qty 2

## 2015-09-10 MED ORDER — AMLODIPINE BESYLATE 10 MG PO TABS
10.0000 mg | ORAL_TABLET | Freq: Every day | ORAL | Status: DC
  Administered 2015-09-11: 10 mg via ORAL
  Filled 2015-09-10: qty 1

## 2015-09-10 MED ORDER — ZOLPIDEM TARTRATE 5 MG PO TABS: 5.0000 mg | ORAL_TABLET | Freq: Every evening | ORAL | Status: DC | PRN

## 2015-09-10 MED ORDER — LIDOCAINE HCL (CARDIAC) 20 MG/ML IV SOLN
INTRAVENOUS | Status: DC | PRN
  Administered 2015-09-10: 80 mg via INTRAVENOUS

## 2015-09-10 MED ORDER — OXYCODONE-ACETAMINOPHEN 5-325 MG PO TABS
1.0000 | ORAL_TABLET | ORAL | Status: DC | PRN
  Administered 2015-09-10 – 2015-09-11 (×2): 2 via ORAL
  Filled 2015-09-10 (×2): qty 2

## 2015-09-10 MED ORDER — ONDANSETRON HCL 4 MG/2ML IJ SOLN: 4.0000 mg | INTRAMUSCULAR | Status: DC | PRN

## 2015-09-10 MED ORDER — VANCOMYCIN HCL 1000 MG IV SOLR
INTRAVENOUS | Status: DC | PRN
  Administered 2015-09-10: 1000 mg

## 2015-09-10 MED ORDER — EPHEDRINE SULFATE 50 MG/ML IJ SOLN
INTRAMUSCULAR | Status: DC | PRN
  Administered 2015-09-10: 10 mg via INTRAVENOUS

## 2015-09-10 MED ORDER — ONDANSETRON HCL 4 MG/2ML IJ SOLN: 4.0000 mg | Freq: Once | INTRAMUSCULAR | Status: DC | PRN

## 2015-09-10 MED ORDER — FENTANYL CITRATE (PF) 250 MCG/5ML IJ SOLN
INTRAMUSCULAR | Status: AC
  Filled 2015-09-10: qty 5

## 2015-09-10 MED ORDER — SODIUM CHLORIDE 0.9 % IV SOLN: INTRAVENOUS | Status: DC

## 2015-09-10 MED ORDER — NEOSTIGMINE METHYLSULFATE 10 MG/10ML IV SOLN
INTRAVENOUS | Status: DC | PRN
  Administered 2015-09-10: 3 mg via INTRAVENOUS

## 2015-09-10 MED ORDER — MENTHOL 3 MG MT LOZG: 1.0000 | LOZENGE | OROMUCOSAL | Status: DC | PRN

## 2015-09-10 MED ORDER — LACTATED RINGERS IV SOLN
INTRAVENOUS | Status: DC | PRN
  Administered 2015-09-10 (×2): via INTRAVENOUS

## 2015-09-10 MED ORDER — ONDANSETRON HCL 4 MG/2ML IJ SOLN
INTRAMUSCULAR | Status: AC
  Filled 2015-09-10: qty 4

## 2015-09-10 MED ORDER — 0.9 % SODIUM CHLORIDE (POUR BTL) OPTIME
TOPICAL | Status: DC | PRN
  Administered 2015-09-10: 1000 mL

## 2015-09-10 MED ORDER — SODIUM CHLORIDE 0.9 % IV SOLN: 250.0000 mL | INTRAVENOUS | Status: DC

## 2015-09-10 MED ORDER — INSULIN ASPART 100 UNIT/ML ~~LOC~~ SOLN: 0.0000 [IU] | Freq: Every day | SUBCUTANEOUS | Status: DC

## 2015-09-10 MED ORDER — GLYCOPYRROLATE 0.2 MG/ML IJ SOLN
INTRAMUSCULAR | Status: AC
  Filled 2015-09-10: qty 1

## 2015-09-10 MED ORDER — PHENOL 1.4 % MT LIQD: 1.0000 | OROMUCOSAL | Status: DC | PRN

## 2015-09-10 MED ORDER — ACETAMINOPHEN 650 MG RE SUPP
650.0000 mg | RECTAL | Status: DC | PRN
Start: 2015-09-10 — End: 2015-09-11

## 2015-09-10 MED ORDER — INSULIN ASPART 100 UNIT/ML ~~LOC~~ SOLN
0.0000 [IU] | Freq: Three times a day (TID) | SUBCUTANEOUS | Status: DC
  Administered 2015-09-10: 3 [IU] via SUBCUTANEOUS
  Administered 2015-09-11: 7 [IU] via SUBCUTANEOUS
  Administered 2015-09-11: 4 [IU] via SUBCUTANEOUS

## 2015-09-10 MED ORDER — MORPHINE SULFATE (PF) 2 MG/ML IV SOLN: 1.0000 mg | INTRAVENOUS | Status: DC | PRN

## 2015-09-10 MED ORDER — MIDAZOLAM HCL 5 MG/5ML IJ SOLN
INTRAMUSCULAR | Status: DC | PRN
  Administered 2015-09-10 (×2): 1 mg via INTRAVENOUS

## 2015-09-10 MED ORDER — GLIPIZIDE ER 10 MG PO TB24
10.0000 mg | ORAL_TABLET | Freq: Every day | ORAL | Status: DC
  Administered 2015-09-11: 10 mg via ORAL
  Filled 2015-09-10 (×2): qty 1

## 2015-09-10 MED ORDER — FENTANYL CITRATE (PF) 100 MCG/2ML IJ SOLN
INTRAMUSCULAR | Status: DC | PRN
  Administered 2015-09-10 (×5): 50 ug via INTRAVENOUS

## 2015-09-10 MED ORDER — DIAZEPAM 5 MG PO TABS: 5.0000 mg | ORAL_TABLET | Freq: Four times a day (QID) | ORAL | Status: DC | PRN

## 2015-09-10 MED ORDER — ONDANSETRON HCL 4 MG/2ML IJ SOLN
INTRAMUSCULAR | Status: DC | PRN
  Administered 2015-09-10: 4 mg via INTRAVENOUS

## 2015-09-10 SURGICAL SUPPLY — 47 items
BENZOIN TINCTURE PRP APPL 2/3 (GAUZE/BANDAGES/DRESSINGS) ×2 IMPLANT
BLADE CLIPPER SURG (BLADE) IMPLANT
BUR ACORN 6.0 (BURR) IMPLANT
BUR MATCHSTICK NEURO 3.0 LAGG (BURR) ×2 IMPLANT
CANISTER SUCT 3000ML PPV (MISCELLANEOUS) ×2 IMPLANT
DRAPE LAPAROTOMY 100X72X124 (DRAPES) ×2 IMPLANT
DRAPE MICROSCOPE LEICA (MISCELLANEOUS) ×2 IMPLANT
DRAPE POUCH INSTRU U-SHP 10X18 (DRAPES) ×2 IMPLANT
DRSG OPSITE POSTOP 4X6 (GAUZE/BANDAGES/DRESSINGS) ×2 IMPLANT
DRSG PAD ABDOMINAL 8X10 ST (GAUZE/BANDAGES/DRESSINGS) IMPLANT
DURAPREP 26ML APPLICATOR (WOUND CARE) ×2 IMPLANT
ELECT REM PT RETURN 9FT ADLT (ELECTROSURGICAL) ×2
ELECTRODE REM PT RTRN 9FT ADLT (ELECTROSURGICAL) ×1 IMPLANT
GAUZE SPONGE 4X4 12PLY STRL (GAUZE/BANDAGES/DRESSINGS) ×2 IMPLANT
GAUZE SPONGE 4X4 16PLY XRAY LF (GAUZE/BANDAGES/DRESSINGS) IMPLANT
GLOVE BIOGEL M 8.0 STRL (GLOVE) ×2 IMPLANT
GLOVE EXAM NITRILE LRG STRL (GLOVE) IMPLANT
GLOVE EXAM NITRILE MD LF STRL (GLOVE) IMPLANT
GLOVE EXAM NITRILE XL STR (GLOVE) IMPLANT
GLOVE EXAM NITRILE XS STR PU (GLOVE) IMPLANT
GOWN STRL REUS W/ TWL LRG LVL3 (GOWN DISPOSABLE) ×1 IMPLANT
GOWN STRL REUS W/ TWL XL LVL3 (GOWN DISPOSABLE) IMPLANT
GOWN STRL REUS W/TWL 2XL LVL3 (GOWN DISPOSABLE) IMPLANT
GOWN STRL REUS W/TWL LRG LVL3 (GOWN DISPOSABLE) ×1
GOWN STRL REUS W/TWL XL LVL3 (GOWN DISPOSABLE)
KIT BASIN OR (CUSTOM PROCEDURE TRAY) ×2 IMPLANT
KIT ROOM TURNOVER OR (KITS) ×2 IMPLANT
NEEDLE HYPO 18GX1.5 BLUNT FILL (NEEDLE) IMPLANT
NEEDLE HYPO 21X1.5 SAFETY (NEEDLE) IMPLANT
NEEDLE HYPO 25X1 1.5 SAFETY (NEEDLE) IMPLANT
NEEDLE SPNL 20GX3.5 QUINCKE YW (NEEDLE) IMPLANT
NS IRRIG 1000ML POUR BTL (IV SOLUTION) ×2 IMPLANT
PACK LAMINECTOMY NEURO (CUSTOM PROCEDURE TRAY) ×2 IMPLANT
PAD ARMBOARD 7.5X6 YLW CONV (MISCELLANEOUS) ×6 IMPLANT
PATTIES SURGICAL .5 X1 (DISPOSABLE) ×2 IMPLANT
RUBBERBAND STERILE (MISCELLANEOUS) ×4 IMPLANT
SPONGE LAP 4X18 X RAY DECT (DISPOSABLE) IMPLANT
SPONGE SURGIFOAM ABS GEL SZ50 (HEMOSTASIS) ×2 IMPLANT
STRIP CLOSURE SKIN 1/2X4 (GAUZE/BANDAGES/DRESSINGS) ×2 IMPLANT
SUT VIC AB 0 CT1 18XCR BRD8 (SUTURE) ×1 IMPLANT
SUT VIC AB 0 CT1 8-18 (SUTURE) ×1
SUT VIC AB 2-0 CP2 18 (SUTURE) ×2 IMPLANT
SUT VIC AB 3-0 SH 8-18 (SUTURE) ×2 IMPLANT
SYR 5ML LL (SYRINGE) IMPLANT
TOWEL OR 17X24 6PK STRL BLUE (TOWEL DISPOSABLE) ×2 IMPLANT
TOWEL OR 17X26 10 PK STRL BLUE (TOWEL DISPOSABLE) ×2 IMPLANT
WATER STERILE IRR 1000ML POUR (IV SOLUTION) ×2 IMPLANT

## 2015-09-10 NOTE — Transfer of Care (Signed)
Immediate Anesthesia Transfer of Care Note  Patient: [name]  Procedure(s) Performed: Procedure(s) with comments: Left L4-5 Foraminotomy/Left L5-S1 Diskectomy (Left) - Left L4-5 Foraminotomy/Left L5-S1 Diskectomy  Patient Location: PACU  Anesthesia Type:General  Level of Consciousness: awake, alert , oriented and patient cooperative  Airway & Oxygen Therapy: Patient Spontanous Breathing and Patient connected to nasal cannula oxygen  Post-op Assessment: Report given to RN, Post -op Vital signs reviewed and stable and Patient moving all extremities  Post vital signs: Reviewed and stable  Last Vitals:  Filed Vitals:   09/10/15 1123  BP: 119/72  Pulse: 68  Temp: 36.8 C  Resp: 18    Complications: No apparent anesthesia complications

## 2015-09-10 NOTE — Anesthesia Preprocedure Evaluation (Addendum)
Anesthesia Evaluation  Patient identified by MRN, date of birth, ID band Patient awake  General Assessment Comment:Pt was originally scheduled for this surgery in August but it was cancelled due to uncontrolled diabetes. Pt has since seen an endocrinologist, Dr. Carlena Sax, (see care everywhere) and has better control of diabetes and endocrinology clearance for surgery (telephone encounter dated 09/03/15).    Reviewed: Allergy & Precautions, NPO status , Patient's Chart, lab work & pertinent test results  History of Anesthesia Complications (+) Family history of anesthesia reaction and history of anesthetic complications ("reports mother has drop in BP with anesthesia", no other family hx of problems)  Airway Mallampati: III  TM Distance: >3 FB Neck ROM: Full    Dental no notable dental hx. (+) Dental Advisory Given   Pulmonary Current Smoker,    Pulmonary exam normal breath sounds clear to auscultation       Cardiovascular hypertension, Pt. on medications Normal cardiovascular exam Rhythm:Regular Rate:Normal     Neuro/Psych PSYCHIATRIC DISORDERS Anxiety Depression Bipolar Disorder negative neurological ROS     GI/Hepatic negative GI ROS, Neg liver ROS,   Endo/Other  diabetes, Type 2, Oral Hypoglycemic Agentsobesity  Renal/GU negative Renal ROS  negative genitourinary   Musculoskeletal  (+) Arthritis , Bilateral lower extremity pain and numbness, more on the L   Abdominal (+) + obese,   Peds negative pediatric ROS (+)  Hematology negative hematology ROS (+)   Anesthesia Other Findings   Reproductive/Obstetrics negative OB ROS                            Anesthesia Physical Anesthesia Plan  ASA: II  Anesthesia Plan: General   Post-op Pain Management:    Induction: Intravenous  Airway Management Planned: Oral ETT  Additional Equipment:   Intra-op Plan:   Post-operative Plan:  Extubation in OR  Informed Consent: I have reviewed the patients History and Physical, chart, labs and discussed the procedure including the risks, benefits and alternatives for the proposed anesthesia with the patient or authorized representative who has indicated his/her understanding and acceptance.   Dental advisory given  Plan Discussed with:   Anesthesia Plan Comments:        Anesthesia Quick Evaluation

## 2015-09-10 NOTE — Anesthesia Postprocedure Evaluation (Signed)
  Anesthesia Post-op Note  Patient: Debra [name]  Procedure(s) Performed: Procedure(s) (LRB): Left L4-5 Foraminotomy/Left L5-S1 Diskectomy (Left)  Patient Location: PACU  Anesthesia Type: General  Level of Consciousness: awake and alert   Airway and Oxygen Therapy: Patient Spontanous Breathing  Post-op Pain: mild  Post-op Assessment: Post-op Vital signs reviewed, Patient's Cardiovascular Status Stable, Respiratory Function Stable, Patent Airway and No signs of Nausea or vomiting  Last Vitals:  Filed Vitals:   09/10/15 1605  BP:   Pulse: 66  Temp:   Resp: 15    Post-op Vital Signs: stable   Complications: No apparent anesthesia complications

## 2015-09-10 NOTE — Op Note (Signed)
NAMEJAHLIYAH, Debra [name] NO.:  [id number]  MEDICAL RECORD NO.:  [id number]  LOCATION:  3C06C                        FACILITY:  [facility]  PHYSICIAN:  Hilda Lias, M.D.   DATE OF BIRTH:  09/08/1961  DATE OF PROCEDURE:  09/10/2015 DATE OF DISCHARGE:                              OPERATIVE REPORT   PREOPERATIVE DIAGNOSES:  Left L4-5 stenosis with a left L5-S1 herniated disc.  Diabetes mellitus.  Obesity.  POSTOPERATIVE DIAGNOSES:  Left L4-5 stenosis with a left L5-S1 herniated disc. Diabetes mellitus.  Obesity.  PROCEDURE:  Left L4-5 hemilaminotomy.  Decompression of the L4 and L5 nerve root.  Left L5-S1 diskectomy.  Lysis of adhesion.  Microscope.  SURGEON:  Hilda Lias, M.D.  CLINICAL HISTORY:  The patient had been complaining of back pain, worsened to the left leg.  She had minimal pain going to the right side. She has twice MRI which showed herniated disc at L5-1 to the left and foraminal narrow.  Surgery was advised, and she and her husband knew the risk and benefit including the possibility of infection, and all the risks associated with her diabetes.  The patient was scheduled before, but we had to cancel because her diabetes was not under control.  DESCRIPTION OF PROCEDURE:  The patient was taken to the OR; and after intubation, she was positioned on prone manner.  The back was cleaned with Betadine and DuraPrep.  Then, a midline incision from L5-S1 was made and muscles were retracted all the way laterally.  We identified easily by palpation as well by x-ray, we were right at the level 4-5, 5- 1.  We proceeded with a hemilaminectomy of L5 which was done because the patient has quite a bit of calcified yellow ligament.  Then, the ligament at L4-5 and L5-1 was removed.  We started at the level of 4-5 left where the patient has quite a bit of narrowing mostly compromising the L5 nerve root.  Using the drill as well as the Kerrison punch, we decompressed  the L4 and L5 nerve root.  At the level of L5-1 indeed what we found that she has a herniated disc that was acute on chronic with fibrosis and calcification.  Decompression of the S1 nerve root especially at the level of the axilla was done.  There was an open in the disc and total gross diskectomy medially and laterally was achieved. Then, the area was irrigated.  Valsalva maneuver was negative.  Fentanyl and Depo-Medrol were left in the epidural space as well as vancomycin powder.  The wound was closed with several layers with Vicryl and Steri- Strips.          ______________________________ Hilda Lias, M.D.     EB/MEDQ  D:  09/10/2015  T:  09/10/2015  Job:  [id number]

## 2015-09-10 NOTE — OR Nursing (Signed)
Fentanyl 100 mcg/26ml given at surgical site by Dr Jeral Fruit

## 2015-09-10 NOTE — Anesthesia Procedure Notes (Signed)
Procedure Name: Intubation Date/Time: 09/10/2015 1:43 PM Performed by: Leonel Ramsay Pre-anesthesia Checklist: Patient identified, Patient being monitored, Emergency Drugs available, Timeout performed and Suction available Patient Re-evaluated:Patient Re-evaluated prior to inductionOxygen Delivery Method: Circle system utilized Preoxygenation: Pre-oxygenation with 100% oxygen Intubation Type: IV induction Ventilation: Mask ventilation without difficulty Laryngoscope Size: Mac and 3 Grade View: Grade I Tube type: Oral Tube size: 7.0 mm Number of attempts: 1 Airway Equipment and Method: Stylet Placement Confirmation: ETT inserted through vocal cords under direct vision,  positive ETCO2 and breath sounds checked- equal and bilateral Secured at: 22 cm Tube secured with: Tape Dental Injury: Teeth and Oropharynx as per pre-operative assessment

## 2015-09-11 ENCOUNTER — Encounter (HOSPITAL_COMMUNITY): Payer: Self-pay | Admitting: Neurosurgery

## 2015-09-11 DIAGNOSIS — M5126 Other intervertebral disc displacement, lumbar region: Secondary | ICD-10-CM | POA: Diagnosis not present

## 2015-09-11 LAB — GLUCOSE, CAPILLARY
GLUCOSE-CAPILLARY: 200 mg/dL — AB (ref 65–99)
GLUCOSE-CAPILLARY: 207 mg/dL — AB (ref 65–99)

## 2015-09-11 MED ORDER — SODIUM CHLORIDE 0.9 % IJ SOLN
INTRAMUSCULAR | Status: AC
  Filled 2015-09-11: qty 10

## 2015-09-11 MED ORDER — EPHEDRINE SULFATE 50 MG/ML IJ SOLN
INTRAMUSCULAR | Status: AC
  Filled 2015-09-11: qty 1

## 2015-09-11 MED ORDER — ROCURONIUM BROMIDE 50 MG/5ML IV SOLN
INTRAVENOUS | Status: AC
  Filled 2015-09-11: qty 1

## 2015-09-11 MED ORDER — SUCCINYLCHOLINE CHLORIDE 20 MG/ML IJ SOLN
INTRAMUSCULAR | Status: AC
  Filled 2015-09-11: qty 1

## 2015-09-11 NOTE — Progress Notes (Signed)
Pt and husband given D/C instructions with Rx's, verbal understanding was provided. Pt's incision is clean and dry with no sign of infection. Pt's IV was removed prior to D/C. Pt D/C'd home via walking @ 1510 per MD order. Pt is stable @ D/C and has no other needs at this time. Rema Fendt, RN

## 2015-09-11 NOTE — Discharge Instructions (Signed)
Wound Care °Leave incision open to air. °You may shower. °Do not scrub directly on incision.  °Leave steri-strips on incision.  They will fall off by themselves. °Do not put any creams, lotions, or ointments on incision. °Activity °Walk each and every day, increasing distance each day. °No lifting greater than 5 lbs.  Avoid bending, arching, and twisting. °No driving for 2 weeks; may ride as a passenger locally. °If provided with back brace, wear when out of bed.  It is not necessary to wear in bed. °Diet °Resume your normal diet.  °Return to Work °Will be discussed at you follow up appointment. °Call Your Doctor If Any of These Occur °Redness, drainage, or swelling at the wound.  °Temperature greater than 101 degrees. °Severe pain not relieved by pain medication. °Incision starts to come apart. °Follow Up Appt °Call today for appointment in 3-4 weeks ([phone]) or for problems.  If you have any hardware placed in your spine, you will need an x-ray before your appointment. ° °

## 2015-09-11 NOTE — Evaluation (Signed)
Occupational Therapy Evaluation & Discharge Patient Details Name: Debra [name] MRN: [id number] DOB: 1961-08-25 Today's Date: 09/11/2015    History of Present Illness 54 yo female admitted with L4-5 hemilaminotomy and L5-s1 diskectomy. PMH: HTN, insomnia, anxiety, depression, obesity, tobacco abuse, DMII, CHI, arthritis   Clinical Impression   Pt admitted to hospital due to reason stated above. Pt currently with functional limitiations due to deficits listed below (see OT problem list). Prior to admission pt was driving and independent with ADLs/IADLs. Pt currently requires supervision to minimal assistance for ADLs. Pt was educated in back precautions and handout provided and pt was able to recall using teach back method.Therapist recommended and provided demonstration of adaptive equipment to assist with compliance of back precautions and safety. All education complete, no further OT acute needs required, however pt will benefit from supervision intermittent to increase her independence and safety with ADLs/IADLs while maintaining back precautions to remain safe at home.    Follow Up Recommendations  Supervision - Intermittent    Equipment Recommendations  None recommended by OT    Recommendations for Other Services       Precautions / Restrictions Precautions Precautions: Back Precaution Comments: handout provided  Required Braces or Orthoses: Spinal Brace Spinal Brace: Lumbar corset;Applied in sitting position Restrictions Weight Bearing Restrictions: No      Mobility Bed Mobility Overal bed mobility: Needs Assistance Bed Mobility: Rolling;Sidelying to Sit Rolling: Modified independent (Device/Increase time) Sidelying to sit: Modified independent (Device/Increase time);HOB elevated       General bed mobility comments: Pt reports having adjustable base bed to assist with bed mobility, however therapist still educated pt in log roll technique to assist with maintaining back  precautions and safety   Transfers Overall transfer level: Needs assistance Equipment used: None Transfers: Sit to/from Stand Sit to Stand: Min guard         General transfer comment: increase time to power up from bed due to tightness and stiffness    Balance Overall balance assessment: Needs assistance Sitting-balance support: No upper extremity supported;Feet supported Sitting balance-Leahy Scale: Good     Standing balance support: No upper extremity supported Standing balance-Leahy Scale: Fair                              ADL Overall ADL's : Needs assistance/impaired Eating/Feeding: Supervision/ safety;Sitting   Grooming: Supervision/safety;Sitting   Upper Body Bathing: Supervision/ safety;Sitting Upper Body Bathing Details (indicate cue type and reason):  Lower Body Bathing: Minimal assistance;Adhering to back precautions;Sitting/lateral leans   Upper Body Dressing : Supervision/safety;Sitting. Pt able to don UE gown with no physical assistance, verbal cues for maintaining back precautions Upper Body Dressing Details (indicate cue type and reason): verbal cues for donning back brace   Lower Body Dressing: Minimal assistance;Adhering to back precautions;Sit to/from stand Lower Body Dressing Details (indicate cue type and reason): Pt able to don/doff sock bil LE socks, verbal cues for maintaining precautions Toilet Transfer: Min guard;Ambulation;Comfort height toilet   Toileting- Clothing Manipulation and Hygiene: Min guard;Sit to/from stand   Tub/ Shower Transfer: Adhering to back precautions;Min guard;Ambulation;Shower seat   Functional mobility during ADLs: Min guard General ADL Comments: Therapist recommended and provided demonstration of adaptive equipment to assist with maintaining back precautions and safety. Pt donned back brace with min verbal cues.     Vision     Perception     Praxis      Pertinent Vitals/Pain Pain Assessment:  No/denies pain     Hand Dominance Right   Extremity/Trunk Assessment Upper Extremity Assessment Upper Extremity Assessment: Overall WFL for tasks assessed   Lower Extremity Assessment Lower Extremity Assessment: Defer to PT evaluation   Cervical / Trunk Assessment Cervical / Trunk Assessment: Other exceptions (s/p surg)   Communication Communication Communication: No difficulties   Cognition Arousal/Alertness: Awake/alert Behavior During Therapy: WFL for tasks assessed/performed Overall Cognitive Status: History of cognitive impairments - at baseline                     General Comments    All education complete, pt educated in back precautions and adaptive equipment.    Exercises       Shoulder Instructions      Home Living Family/patient expects to be discharged to:: Private residence Living Arrangements: Spouse/significant other;Non-relatives/Friends Available Help at Discharge: Family;Friend(s);Available PRN/intermittently (friend is going to assist) Type of Home: House Home Access: Level entry     Home Layout: One level     Bathroom Shower/Tub: Tub/shower unit Shower/tub characteristics: Engineer, building services: Handicapped height Bathroom Accessibility: Yes   Home Equipment: Shower seat;Grab bars - tub/shower;Walker - standard          Prior Functioning/Environment Level of Independence: Independent        Comments: drives    OT Diagnosis: Generalized weakness;Acute pain   OT Problem List: Decreased strength;Decreased cognition;Decreased knowledge of use of DME or AE;Pain   OT Treatment/Interventions:      OT Goals(Current goals can be found in the care plan section) Acute Rehab OT Goals Patient Stated Goal: return home OT Goal Formulation: With patient  OT Frequency:     Barriers to D/C:            Co-evaluation              End of Session Equipment Utilized During Treatment: Gait belt  Activity Tolerance: Patient  tolerated treatment well Patient left: in chair;with call bell/phone within reach   Time: 0715-0736 OT Time Calculation (min): 21 min Charges:  OT General Charges $OT Visit: 1 Procedure OT Evaluation $Initial OT Evaluation Tier I: 1 Procedure G-Codes: OT G-codes **NOT FOR INPATIENT CLASS** Functional Assessment Tool Used: clinical judgement Functional Limitation: Self care Self Care Current Status (C1660): At least 1 percent but less than 20 percent impaired, limited or restricted Self Care Goal Status (Y3016): At least 1 percent but less than 20 percent impaired, limited or restricted Self Care Discharge Status [phone]): At least 1 percent but less than 20 percent impaired, limited or restricted  Smiley Houseman 09/11/2015, 10:13 AM

## 2015-09-11 NOTE — Discharge Summary (Signed)
Physician Discharge Summary  Patient ID: Debra [name] MRN: [id number] DOB/AGE: 53/29/1962 53 y.o.  Admit date: 09/10/2015 Discharge date: 09/11/2015  Admission Diagnoses:lumbar herniated disc and stenosis  Discharge Diagnoses:  Active Problems:   Lumbar herniated disc   Discharged Condition:no pain  Hospital Course: surgery  Consults: none  Significant Diagnostic Studies:mri  Treatments:discectomy  Discharge Exam: Blood pressure 99/49, pulse 65, temperature 98.7 F (37.1 C), temperature source Oral, resp. rate 16, height 5' 5.5" (1.664 m), weight 96.616 kg (213 lb), SpO2 96 %. No pain  Disposition: home     Medication List    ASK your doctor about these medications        ACCU-CHEK SMARTVIEW test strip  Generic drug:  glucose blood     Alcohol Prep Pads     amLODipine 10 MG tablet  Commonly known as:  NORVASC  TAKE 1 TABLET (10 MG TOTAL) BY MOUTH DAILY.     aspirin EC 81 MG tablet  Take 81 mg by mouth daily.     calcium-vitamin D 500-200 MG-UNIT tablet  Commonly known as:  OSCAL WITH D  Take 1 tablet by mouth daily.     folic acid 400 MCG tablet  Commonly known as:  FOLVITE  Take 800 mcg by mouth daily.     furosemide 20 MG tablet  Commonly known as:  LASIX  Take 20 mg by mouth daily.     glipiZIDE 10 MG 24 hr tablet  Commonly known as:  GLUCOTROL XL  Take 1 tablet (10 mg total) by mouth daily with breakfast.     Insulin Glargine 100 UNIT/ML Solostar Pen  Commonly known as:  LANTUS SOLOSTAR  Inject 40 Units into the skin daily at 10 pm.     INVOKANA 300 MG Tabs tablet  Generic drug:  canagliflozin  Take 1 tablet by mouth daily.     lisinopril 10 MG tablet  Commonly known as:  PRINIVIL,ZESTRIL  TAKE 1 TABLET (10 MG TOTAL) BY MOUTH DAILY.     oxyCODONE 5 MG immediate release tablet  Commonly known as:  Oxy IR/ROXICODONE  Take 10 mg by mouth every 6 (six) hours as needed for severe pain.     PARoxetine 40 MG tablet  Commonly known as:  PAXIL   TAKE ONE TABLET BY MOUTH EVERY DAY     Vitamins A & D 5000-400 UNITS Caps  Take 1 capsule by mouth daily.         Signed: Karn Cassis 09/11/2015, 2:46 PM

## 2015-09-11 NOTE — Evaluation (Signed)
Physical Therapy Evaluation Patient Details Name: Debra [name] MRN: [id number] DOB: 08-13-1961 Today's Date: 09/11/2015   History of Present Illness  54 yo female admitted with L4-5 hemilaminotomy and L5-s1 diskectomy. PMH: HTN, insomnia, anxiety, depression, obesity, tobacco abuse, DMII, CHI, arthritis  Clinical Impression  Pt was able to demonstrate safe mobility with PT.  She has sufficient assist at discharge at home.  All eduction reinforced.  PT to sign off.   Follow Up Recommendations No PT follow up    Equipment Recommendations  None recommended by PT    Recommendations for Other Services   NA    Precautions / Restrictions Precautions Precautions: Back Precaution Booklet Issued: Yes (comment) Precaution Comments: handout provided by OT and reviewed by PT Required Braces or Orthoses: Spinal Brace Spinal Brace: Lumbar corset;Applied in sitting position Restrictions Weight Bearing Restrictions: No      Mobility  Bed Mobility Overal bed mobility: Needs Assistance Bed Mobility: Rolling;Sidelying to Sit;Sit to Sidelying Rolling: Modified independent (Device/Increase time) Sidelying to sit: Modified independent (Device/Increase time)     Sit to sidelying: Modified independent (Device/Increase time) General bed mobility comments: Verbal cues for log roll and reverse log roll technique.  Pt able to demonstrate from flat bed with no rail.   Transfers Overall transfer level: Needs assistance Equipment used: None Transfers: Sit to/from Stand Sit to Stand: Supervision         General transfer comment: supervision for safety due to slow speed of transitions  Ambulation/Gait Ambulation/Gait assistance: Supervision Ambulation Distance (Feet): 150 Feet Assistive device: None Gait Pattern/deviations: WFL(Within Functional Limits) Gait velocity: decreased Gait velocity interpretation: Below normal speed for age/gender General Gait Details: slow, but steady gait  pattern         Balance Overall balance assessment: Needs assistance Sitting-balance support: Feet supported;No upper extremity supported Sitting balance-Leahy Scale: Good     Standing balance support: No upper extremity supported Standing balance-Leahy Scale: Good                               Pertinent Vitals/Pain Pain Assessment: No/denies pain    Home Living Family/patient expects to be discharged to:: Private residence Living Arrangements: Spouse/significant other;Non-relatives/Friends Available Help at Discharge: Family;Friend(s);Available PRN/intermittently (friend is going to assist) Type of Home: House Home Access: Level entry     Home Layout: One level Home Equipment: Shower seat;Grab bars - tub/shower;Walker - standard      Prior Function Level of Independence: Independent         Comments: drives     Hand Dominance   Dominant Hand: Right    Extremity/Trunk Assessment   Upper Extremity Assessment: Defer to OT evaluation           Lower Extremity Assessment: Overall WFL for tasks assessed (some numbness in right foot- tingling)      Cervical / Trunk Assessment: Normal  Communication   Communication: No difficulties  Cognition Arousal/Alertness: Awake/alert Behavior During Therapy: WFL for tasks assessed/performed Overall Cognitive Status: Within Functional Limits for tasks assessed                               Assessment/Plan    PT Assessment Patent does not need any further PT services  PT Diagnosis Difficulty walking;Abnormality of gait;Generalized weakness;Acute pain         PT Goals (Current goals can be found in the Care Plan  section) Acute Rehab PT Goals Patient Stated Goal: return home PT Goal Formulation: All assessment and education complete, DC therapy               End of Session Equipment Utilized During Treatment: Back brace Activity Tolerance: Patient tolerated treatment well Patient  left: in bed;with call bell/phone within reach      Functional Assessment Tool Used: assist level Functional Limitation: Mobility: Walking and moving around Mobility: Walking and Moving Around Current Status (Z6109): At least 1 percent but less than 20 percent impaired, limited or restricted Mobility: Walking and Moving Around Goal Status [phone]): At least 1 percent but less than 20 percent impaired, limited or restricted Mobility: Walking and Moving Around Discharge Status [phone]): At least 1 percent but less than 20 percent impaired, limited or restricted    Time: 1002-1018 PT Time Calculation (min) (ACUTE ONLY): 16 min   Charges:   PT Evaluation $Initial PT Evaluation Tier I: 1 Procedure     PT G Codes:   PT G-Codes **NOT FOR INPATIENT CLASS** Functional Assessment Tool Used: assist level Functional Limitation: Mobility: Walking and moving around Mobility: Walking and Moving Around Current Status (B1478): At least 1 percent but less than 20 percent impaired, limited or restricted Mobility: Walking and Moving Around Goal Status [phone]): At least 1 percent but less than 20 percent impaired, limited or restricted Mobility: Walking and Moving Around Discharge Status [phone]): At least 1 percent but less than 20 percent impaired, limited or restricted    Zakee Deerman B. Bricyn Labrada, PT, DPT [phone]   09/11/2015, 11:20 AM

## 2015-09-13 ENCOUNTER — Ambulatory Visit: Payer: Commercial Managed Care - HMO | Admitting: Family Medicine

## 2015-10-02 ENCOUNTER — Telehealth: Payer: Self-pay | Admitting: Family Medicine

## 2015-10-02 MED ORDER — PAROXETINE HCL 40 MG PO TABS: 40.0000 mg | ORAL_TABLET | Freq: Every day | ORAL | Status: DC

## 2015-10-02 NOTE — Telephone Encounter (Signed)
Medication has been refilled and sent to [facility] [address] 

## 2015-10-02 NOTE — Addendum Note (Signed)
Addended bySherryll Burger: Yesenia Fontenette A A on: 10/02/2015 06:24 PM   Modules accepted: Orders

## 2015-10-03 NOTE — Telephone Encounter (Signed)
[facility] [address]

## 2015-10-04 ENCOUNTER — Other Ambulatory Visit: Payer: Self-pay

## 2015-10-04 MED ORDER — PAROXETINE HCL 40 MG PO TABS: 40.0000 mg | ORAL_TABLET | Freq: Every day | ORAL | Status: DC

## 2015-10-04 NOTE — Telephone Encounter (Signed)
Pt states [facility] called on Monday about this RX and she went there yesterday after we told her it was sent and it was not there and that's when patient called back and she states it is still not there. Please advise.

## 2015-11-21 ENCOUNTER — Telehealth: Payer: Self-pay | Admitting: Family Medicine

## 2015-11-21 NOTE — Telephone Encounter (Signed)
PT NEEDS REFILL ON LINSINOPRIL., AND AMLODI[PINE. PHARM IS [facility] [address]

## 2015-11-22 NOTE — Telephone Encounter (Signed)
Left voicemail for patient informing her to schedule an appointment once appointment has been schedule we will refill a 30-day supply.

## 2015-12-11 ENCOUNTER — Encounter: Payer: Self-pay | Admitting: Family Medicine

## 2015-12-11 ENCOUNTER — Ambulatory Visit (INDEPENDENT_AMBULATORY_CARE_PROVIDER_SITE_OTHER): Payer: Medicare Other | Admitting: Family Medicine

## 2015-12-11 VITALS — BP 118/78 | HR 90 | Temp 98.2°F | Resp 16 | Ht 66.0 in | Wt 224.6 lb

## 2015-12-11 DIAGNOSIS — R399 Unspecified symptoms and signs involving the genitourinary system: Secondary | ICD-10-CM

## 2015-12-11 DIAGNOSIS — I10 Essential (primary) hypertension: Secondary | ICD-10-CM | POA: Diagnosis not present

## 2015-12-11 DIAGNOSIS — B373 Candidiasis of vulva and vagina: Secondary | ICD-10-CM

## 2015-12-11 DIAGNOSIS — B370 Candidal stomatitis: Secondary | ICD-10-CM | POA: Insufficient documentation

## 2015-12-11 DIAGNOSIS — B3731 Acute candidiasis of vulva and vagina: Secondary | ICD-10-CM

## 2015-12-11 MED ORDER — FLUCONAZOLE 150 MG PO TABS: 150.0000 mg | ORAL_TABLET | Freq: Once | ORAL | Status: DC

## 2015-12-11 MED ORDER — AMLODIPINE BESYLATE 10 MG PO TABS: 10.0000 mg | ORAL_TABLET | Freq: Every day | ORAL | Status: DC

## 2015-12-11 MED ORDER — NITROFURANTOIN MONOHYD MACRO 100 MG PO CAPS: 100.0000 mg | ORAL_CAPSULE | Freq: Two times a day (BID) | ORAL | Status: DC

## 2015-12-11 MED ORDER — LISINOPRIL 10 MG PO TABS: 10.0000 mg | ORAL_TABLET | Freq: Every day | ORAL | Status: DC

## 2015-12-11 NOTE — Progress Notes (Signed)
Name: Debra [name]   MRN: [id number]    DOB: 01/09/1961   Date:12/11/2015       Progress Note  Subjective  Chief Complaint  Chief Complaint  Patient presents with  . Medication Refill    amlodipine 10 mg / lisinopril 10 mg     Hypertension This is a chronic problem. The problem has been gradually improving (Pt. is not on blood pressure medications for over 2 weeks and her BP is fine.) since onset. The problem is controlled. Pertinent negatives include no blurred vision, chest pain, headaches, malaise/fatigue, palpitations or shortness of breath. Past treatments include ACE inhibitors and calcium channel blockers. There is no history of kidney disease, CAD/MI or CVA.  Urinary Tract Infection  This is a new problem. The current episode started in the past 7 days (1 week ago). The problem occurs every urination. The quality of the pain is described as burning. The pain is at a severity of 4/10. The pain is mild. There has been no fever. There is no history of pyelonephritis. Associated symptoms include flank pain, nausea and urgency. Pertinent negatives include no chills, hematuria or vomiting. She has tried nothing for the symptoms. There is no history of kidney stones or recurrent UTIs.  Vaginal Itching The patient's primary symptoms include genital itching and vaginal discharge (intermittent white discharge). The patient's pertinent negatives include no vaginal bleeding. This is a new problem. The current episode started in the past 7 days (1 week ago). The patient is experiencing no pain. Associated symptoms include dysuria, flank pain, nausea and urgency. Pertinent negatives include no chills, fever, headaches, hematuria or vomiting. The vaginal discharge was white. There has been no bleeding. She has tried nothing for the symptoms. She is sexually active.    Past Medical History  Diagnosis Date  . Hypertension   . Insomnia   . Anxiety   . Depression   . Hyperlipidemia   . Obesity   .  Tobacco abuse   . Family history of adverse reaction to anesthesia     Mother - BP drops  . Diabetes mellitus without complication (HCC)     Type II  . Head injury, closed, with brief LOC (HCC) 2011ish  . Seasonal allergies   . Arthritis     Past Surgical History  Procedure Laterality Date  . Cholecystectomy  2013  . Abdominal hysterectomy  2012  . Cesarean section  1992  . Lumbar laminectomy/decompression microdiscectomy Left 09/10/2015    Procedure: Left L4-5 Foraminotomy/Left L5-S1 Diskectomy;  Surgeon: Hilda Lias, MD;  Location: [facility];  Service: Neurosurgery;  Laterality: Left;  Left L4-5 Foraminotomy/Left L5-S1 Diskectomy    Family History  Problem Relation Age of Onset  . COPD Mother   . Bipolar disorder Father   . Bipolar disorder Brother   . Diabetes Mellitus II Father   . Diabetes Mellitus II Brother     Social History   Social History  . Marital Status: Married    Spouse Name: N/A  . Number of Children: N/A  . Years of Education: N/A   Occupational History  . Not on file.   Social History Main Topics  . Smoking status: Current Every Day Smoker -- 0.50 packs/day for 24 years    Types: Cigarettes  . Smokeless tobacco: Not on file  . Alcohol Use: No     Comment: occasional- rare  . Drug Use: No  . Sexual Activity: Not on file   Other Topics Concern  .  Not on file   Social History Narrative     Current outpatient prescriptions:  .  ACCU-CHEK SMARTVIEW test strip, , Disp: , Rfl:  .  Alcohol Swabs (ALCOHOL PREP) PADS, , Disp: , Rfl:  .  amLODipine (NORVASC) 10 MG tablet, TAKE 1 TABLET (10 MG TOTAL) BY MOUTH DAILY., Disp: 90 tablet, Rfl: 0 .  aspirin EC 81 MG tablet, Take 81 mg by mouth daily., Disp: , Rfl:  .  folic acid (FOLVITE) 400 MCG tablet, Take 800 mcg by mouth daily. , Disp: , Rfl:  .  glipiZIDE (GLUCOTROL XL) 10 MG 24 hr tablet, Take 1 tablet (10 mg total) by mouth daily with breakfast., Disp: 90 tablet, Rfl: 0 .  Insulin Glargine  (LANTUS SOLOSTAR) 100 UNIT/ML Solostar Pen, Inject 40 Units into the skin daily at 10 pm. (Patient taking differently: Inject 45 Units into the skin daily at 10 pm. ), Disp: 4 pen, Rfl: PRN .  INVOKANA 300 MG TABS tablet, Take 1 tablet by mouth daily., Disp: , Rfl:  .  lisinopril (PRINIVIL,ZESTRIL) 10 MG tablet, TAKE 1 TABLET (10 MG TOTAL) BY MOUTH DAILY., Disp: 90 tablet, Rfl: 0 .  PARoxetine (PAXIL) 40 MG tablet, Take 1 tablet (40 mg total) by mouth daily., Disp: 90 tablet, Rfl: 0  Allergies  Allergen Reactions  . Metformin And Related Diarrhea  . Benadryl [Diphenhydramine Hcl] Swelling    blistering  . Cholesterol Other (See Comments)    meds cause her to pass out  . Depakote Er [Divalproex Sodium Er] Other (See Comments)    seizures  . Dilaudid [Hydromorphone Hcl] Other (See Comments)    seizures  . Diphenhydramine-Zinc Acetate Other (See Comments)  . Haloperidol Other (See Comments)  . Hydromorphone Other (See Comments)  . Neosporin [Neomycin-Bacitracin Zn-Polymyx] Swelling    blistering  . Singulair [Montelukast Sodium] Other (See Comments)    Asthma attacks  . Sitagliptin Other (See Comments)  . Statins Other (See Comments)  . Sulfa Antibiotics Hives  . Victoza [Liraglutide] Other (See Comments)    pancreatitis     Review of Systems  Constitutional: Negative for fever, chills and malaise/fatigue.  Eyes: Negative for blurred vision and double vision.  Respiratory: Negative for shortness of breath.   Cardiovascular: Negative for chest pain and palpitations.  Gastrointestinal: Positive for nausea. Negative for vomiting.  Genitourinary: Positive for dysuria, urgency, flank pain and vaginal discharge (intermittent white discharge). Negative for hematuria.  Neurological: Negative for headaches.    Objective  Filed Vitals:   12/11/15 1634  BP: 118/78  Pulse: 90  Temp: 98.2 F (36.8 C)  TempSrc: Oral  Resp: 16  Height: 5\' 6"  (1.676 m)  Weight: 224 lb 9.6 oz (101.878  kg)  SpO2: 94%    Physical Exam  Constitutional: She is oriented to person, place, and time and well-developed, well-nourished, and in no distress.  HENT:  Head: Normocephalic and atraumatic.  Whitish discharge in oral cavity easily scraped with tongue depressor.  Cardiovascular: Normal rate, regular rhythm and normal heart sounds.   Pulmonary/Chest: Effort normal and breath sounds normal.  Abdominal: Soft. Bowel sounds are normal. There is tenderness (in flank region bilaterally.).  Neurological: She is alert and oriented to person, place, and time.  Skin: Skin is warm and dry.  Nursing note and vitals reviewed.  Assessment & Plan  1. Essential hypertension Advised to discontinue amlodipine and continue on lisinopril. Refills for both amlodipine and lisinopril provided in case her blood pressure starts going back up.  Advised to check BP regularly and bring logs for review - amLODipine (NORVASC) 10 MG tablet; Take 1 tablet (10 mg total) by mouth daily.  Dispense: 90 tablet; Refill: 0 - lisinopril (PRINIVIL,ZESTRIL) 10 MG tablet; Take 1 tablet (10 mg total) by mouth daily.  Dispense: 90 tablet; Refill: 0  2. Urinary tract infection symptoms Will obtain urinalysis, culture and start patient on Macrobid. - nitrofurantoin, macrocrystal-monohydrate, (MACROBID) 100 MG capsule; Take 1 capsule (100 mg total) by mouth 2 (two) times daily.  Dispense: 14 capsule; Refill: 0 - Urinalysis, Routine w reflex microscopic - Urine Culture  3. Vaginitis due to Candida We'll treat for presumptive candida vaginitis. If patient is still symptomatic after treatment, will return for pelvic exam and wet prep. Verbalized agreement with plan. - fluconazole (DIFLUCAN) 150 MG tablet; Take 1 tablet (150 mg total) by mouth once.  Dispense: 2 tablet; Refill: 0   Herberth Deharo Asad A. Faylene KurtzShah [facility] 12/11/2015 5:26 PM

## 2015-12-12 DIAGNOSIS — R399 Unspecified symptoms and signs involving the genitourinary system: Secondary | ICD-10-CM | POA: Diagnosis not present

## 2015-12-13 LAB — URINALYSIS, ROUTINE W REFLEX MICROSCOPIC
Bilirubin, UA: NEGATIVE
LEUKOCYTES UA: NEGATIVE
Nitrite, UA: NEGATIVE
PH UA: 5.5 (ref 5.0–7.5)
RBC, UA: NEGATIVE
Specific Gravity, UA: >=1.03 — AB (ref 1.005–1.030)
Urobilinogen, Ur: 0.2 mg/dL (ref 0.2–1.0)

## 2015-12-13 LAB — URINE CULTURE: Organism ID, Bacteria: NO GROWTH

## 2015-12-17 ENCOUNTER — Telehealth: Payer: Self-pay

## 2015-12-17 DIAGNOSIS — J019 Acute sinusitis, unspecified: Secondary | ICD-10-CM

## 2015-12-17 MED ORDER — AZITHROMYCIN 250 MG PO TABS: ORAL_TABLET | ORAL | Status: DC

## 2015-12-17 NOTE — Telephone Encounter (Signed)
Patient complains of greenish grayish nasal discharge, watery eyes, facial pressure when bends over and trouble breathing with mouth closed. Symptoms suggestive of sinusitis. We'll Rx Z-Pak, recommended increased fluid intake. Rx will be sent to pharmacy.

## 2015-12-17 NOTE — Telephone Encounter (Signed)
Routed to Dr. Sherryll BurgerShah for advice and prescription approval

## 2015-12-20 ENCOUNTER — Other Ambulatory Visit: Payer: Self-pay

## 2015-12-20 NOTE — Telephone Encounter (Signed)
Routed to Dr. Shah for approval 

## 2015-12-22 MED ORDER — PAROXETINE HCL 40 MG PO TABS: 40.0000 mg | ORAL_TABLET | Freq: Every day | ORAL | Status: DC

## 2015-12-27 ENCOUNTER — Telehealth: Payer: Self-pay | Admitting: Family Medicine

## 2015-12-27 NOTE — Telephone Encounter (Signed)
Patient states that Paxil was sent to her local pharmacy, however it was suppose to have been sent to [facility]. Please call once prescription has been sent. Thank you

## 2015-12-27 NOTE — Telephone Encounter (Signed)
Patient has been notified that her prescription refill for paxil is here awaiting her pickup at office

## 2016-03-05 ENCOUNTER — Other Ambulatory Visit: Payer: Self-pay | Admitting: Family Medicine

## 2016-04-06 ENCOUNTER — Other Ambulatory Visit: Payer: Self-pay | Admitting: Family Medicine

## 2016-04-06 ENCOUNTER — Telehealth: Payer: Self-pay | Admitting: Family Medicine

## 2016-04-06 DIAGNOSIS — F3174 Bipolar disorder, in full remission, most recent episode manic: Secondary | ICD-10-CM

## 2016-04-06 NOTE — Telephone Encounter (Signed)
[facility] IS NEEDING A RX FOR PAROXETINE . PHARM IS [facility]. CAN FAX BE FAXED TO [phone]

## 2016-04-06 NOTE — Telephone Encounter (Signed)
Pt [facility]

## 2016-04-09 NOTE — Telephone Encounter (Signed)
Refill request has been routed to Dr. Sherryll BurgerShah for approval

## 2016-04-09 NOTE — Telephone Encounter (Signed)
Routed to Dr. Shah for refill approval 

## 2016-04-10 DIAGNOSIS — Z794 Long term (current) use of insulin: Secondary | ICD-10-CM | POA: Diagnosis not present

## 2016-04-10 DIAGNOSIS — E1165 Type 2 diabetes mellitus with hyperglycemia: Secondary | ICD-10-CM | POA: Diagnosis not present

## 2016-04-13 MED ORDER — PAROXETINE HCL 40 MG PO TABS: 40.0000 mg | ORAL_TABLET | Freq: Every day | ORAL | Status: DC

## 2016-04-13 NOTE — Telephone Encounter (Signed)
Prescription for paroxetine 40 mg daily has been sent to patient's pharmacy

## 2016-04-13 NOTE — Telephone Encounter (Signed)
PT HAS CALLED AND IS VERY UPSET FOR SHE SAID SHE HAS PLACED A MESSAGE AND SO HAS THE PHARM HAS CALLED LAST Friday FOR THIS RX. PT IS OUT.

## 2016-04-17 DIAGNOSIS — E1165 Type 2 diabetes mellitus with hyperglycemia: Secondary | ICD-10-CM | POA: Diagnosis not present

## 2016-04-17 DIAGNOSIS — Z794 Long term (current) use of insulin: Secondary | ICD-10-CM | POA: Diagnosis not present

## 2016-04-17 DIAGNOSIS — E782 Mixed hyperlipidemia: Secondary | ICD-10-CM | POA: Diagnosis not present

## 2016-04-17 DIAGNOSIS — F17200 Nicotine dependence, unspecified, uncomplicated: Secondary | ICD-10-CM | POA: Diagnosis not present

## 2016-04-23 DIAGNOSIS — Z6836 Body mass index (BMI) 36.0-36.9, adult: Secondary | ICD-10-CM | POA: Diagnosis not present

## 2016-04-23 DIAGNOSIS — M5126 Other intervertebral disc displacement, lumbar region: Secondary | ICD-10-CM | POA: Diagnosis not present

## 2016-04-23 DIAGNOSIS — M5416 Radiculopathy, lumbar region: Secondary | ICD-10-CM | POA: Diagnosis not present

## 2016-04-27 ENCOUNTER — Emergency Department
Admission: EM | Admit: 2016-04-27 | Discharge: 2016-04-27 | Disposition: A | Discharge status: Home / Self Care | Payer: Managed Care, Other (non HMO) | Attending: Emergency Medicine | Admitting: Emergency Medicine

## 2016-04-27 ENCOUNTER — Encounter: Payer: Self-pay | Admitting: Emergency Medicine

## 2016-04-27 DIAGNOSIS — F329 Major depressive disorder, single episode, unspecified: Secondary | ICD-10-CM | POA: Insufficient documentation

## 2016-04-27 DIAGNOSIS — E119 Type 2 diabetes mellitus without complications: Secondary | ICD-10-CM | POA: Insufficient documentation

## 2016-04-27 DIAGNOSIS — F17210 Nicotine dependence, cigarettes, uncomplicated: Secondary | ICD-10-CM | POA: Diagnosis not present

## 2016-04-27 DIAGNOSIS — E860 Dehydration: Secondary | ICD-10-CM | POA: Diagnosis not present

## 2016-04-27 DIAGNOSIS — R55 Syncope and collapse: Secondary | ICD-10-CM | POA: Diagnosis not present

## 2016-04-27 DIAGNOSIS — E785 Hyperlipidemia, unspecified: Secondary | ICD-10-CM | POA: Insufficient documentation

## 2016-04-27 DIAGNOSIS — M1990 Unspecified osteoarthritis, unspecified site: Secondary | ICD-10-CM | POA: Insufficient documentation

## 2016-04-27 DIAGNOSIS — Z7982 Long term (current) use of aspirin: Secondary | ICD-10-CM | POA: Insufficient documentation

## 2016-04-27 DIAGNOSIS — I10 Essential (primary) hypertension: Secondary | ICD-10-CM | POA: Diagnosis not present

## 2016-04-27 DIAGNOSIS — Z794 Long term (current) use of insulin: Secondary | ICD-10-CM | POA: Insufficient documentation

## 2016-04-27 DIAGNOSIS — E669 Obesity, unspecified: Secondary | ICD-10-CM | POA: Diagnosis not present

## 2016-04-27 LAB — BASIC METABOLIC PANEL
Anion gap: 9 (ref 5–15)
BUN: 25 mg/dL — AB (ref 6–20)
CHLORIDE: 109 mmol/L (ref 101–111)
CO2: 19 mmol/L — ABNORMAL LOW (ref 22–32)
CREATININE: 1.21 mg/dL — AB (ref 0.44–1.00)
Calcium: 9.6 mg/dL (ref 8.9–10.3)
GFR calc Af Amer: 58 mL/min — ABNORMAL LOW (ref >60)
GFR, EST NON AFRICAN AMERICAN: 50 mL/min — AB (ref >60)
GLUCOSE: 180 mg/dL — AB (ref 65–99)
POTASSIUM: 4.4 mmol/L (ref 3.5–5.1)
Sodium: 137 mmol/L (ref 135–145)

## 2016-04-27 LAB — CBC
HEMATOCRIT: 44.9 % (ref 35.0–47.0)
Hemoglobin: 15.1 g/dL (ref 12.0–16.0)
MCH: 31.6 pg (ref 26.0–34.0)
MCHC: 33.7 g/dL (ref 32.0–36.0)
MCV: 93.7 fL (ref 80.0–100.0)
Platelets: 247 10*3/uL (ref 150–440)
RBC: 4.79 MIL/uL (ref 3.80–5.20)
RDW: 13.4 % (ref 11.5–14.5)
WBC: 12.6 10*3/uL — ABNORMAL HIGH (ref 3.6–11.0)

## 2016-04-27 LAB — TROPONIN I: Troponin I: <0.03 ng/mL (ref <0.031)

## 2016-04-27 LAB — GLUCOSE, CAPILLARY: Glucose-Capillary: 179 mg/dL — ABNORMAL HIGH (ref 65–99)

## 2016-04-27 MED ORDER — SODIUM CHLORIDE 0.9 % IV BOLUS (SEPSIS): 1000.0000 mL | Freq: Once | INTRAVENOUS | Status: DC

## 2016-04-27 MED ORDER — SODIUM CHLORIDE 0.9 % IV BOLUS (SEPSIS)
1000.0000 mL | Freq: Once | INTRAVENOUS | Status: AC
Start: 2016-04-27 — End: 2016-04-27
  Administered 2016-04-27: 1000 mL via INTRAVENOUS

## 2016-04-27 NOTE — ED Notes (Signed)
Pt reports pain at IV site, IV site swollen, fluids stopped and IV removed

## 2016-04-27 NOTE — Discharge Instructions (Signed)
Please seek medical attention for any high fevers, chest pain, shortness of breath, change in behavior, persistent vomiting, bloody stool or any other new or concerning symptoms. ° ° °Syncope °Syncope means a person passes out (faints). The person usually wakes up in less than 5 minutes. It is important to seek medical care for syncope. °HOME CARE °· Have someone stay with you until you feel normal. °· Do not drive, use machines, or play sports until your doctor says it is okay. °· Keep all doctor visits as told. °· Lie down when you feel like you might pass out. Take deep breaths. Wait until you feel normal before standing up. °· Drink enough fluids to keep your pee (urine) clear or pale yellow. °· If you take blood pressure or heart medicine, get up slowly. Take several minutes to sit and then stand. °GET HELP RIGHT AWAY IF:  °· You have a severe headache. °· You have pain in the chest, belly (abdomen), or back. °· You are bleeding from the mouth or butt (rectum). °· You have black or tarry poop (stool). °· You have an irregular or very fast heartbeat. °· You have pain with breathing. °· You keep passing out, or you have shaking (seizures) when you pass out. °· You pass out when sitting or lying down. °· You feel confused. °· You have trouble walking. °· You have severe weakness. °· You have vision problems. °If you fainted, call for help (911 in [place] Do not drive yourself to the hospital. °  °This information is not intended to replace advice given to you by your health care provider. Make sure you discuss any questions you have with your health care provider. °  °Document Released: 05/11/2008 Document Revised: 04/09/2015 Document Reviewed: 01/22/2012 °Elsevier Interactive Patient Education ©2016 [employer] ° °

## 2016-04-27 NOTE — ED Provider Notes (Signed)
[facility] Emergency Department Provider Note    ____________________________________________  Time seen: ~1705  I have reviewed the triage vital signs and the nursing notes.   HISTORY  Chief Complaint Loss of Consciousness   History limited by: Not Limited   HPI Debra [name] is a 55 y.o. female who presents to the emergency department today after a syncopal episode. The patient was at work when she started feeling dizzy and nauseous. She thought that her blood sugar might be going low since that is how she normally feels when her blood sugar goes low. She was headed to the bathroom to splash cold water on her face when she passed out. She then remembers being on the floor. She denies any chest pain or palpitations either before or after the syncopal episode. Patient does state that she had her first day back at work since an increase in her [employer]. She does say that she did not drink her normal amount of water today.    Past Medical History  Diagnosis Date  . Hypertension   . Insomnia   . Anxiety   . Depression   . Hyperlipidemia   . Obesity   . Tobacco abuse   . Family history of adverse reaction to anesthesia     Mother - BP drops  . Diabetes mellitus without complication (HCC)     Type II  . Head injury, closed, with brief LOC (HCC) 2011ish  . Seasonal allergies   . Arthritis     Patient Active Problem List   Diagnosis Date Noted  . Oral candidiasis 12/11/2015  . Urinary tract infection symptoms 12/11/2015  . Need for immunization against influenza 08/28/2015  . Acute low back pain with radicular symptoms, duration less than 6 weeks 08/28/2015  . Need for vaccination 08/21/2015  . Lumbar disc disease with radiculopathy 08/20/2015  . Lumbar herniated disc 08/20/2015  . Uncontrolled diabetes mellitus (HCC) 08/20/2015  . Left-sided low back pain with sciatica 06/03/2015  . Abdominal gas pain 06/03/2015  . Abdominal pain, generalized  06/03/2015  . Bipolar disorder (HCC) 06/03/2015  . Insomnia, persistent 06/03/2015  . BP (high blood pressure) 06/03/2015  . Type 2 diabetes mellitus treated with insulin (HCC) 06/03/2015  . Leg swelling 06/03/2015  . Extreme obesity (HCC) 06/03/2015  . Compulsive tobacco user syndrome 06/03/2015  . Vaginitis due to Candida 06/03/2015  . Phlebectasia 06/03/2015    Past Surgical History  Procedure Laterality Date  . Cholecystectomy  2013  . Abdominal hysterectomy  2012  . Cesarean section  1992  . Lumbar laminectomy/decompression microdiscectomy Left 09/10/2015    Procedure: Left L4-5 Foraminotomy/Left L5-S1 Diskectomy;  Surgeon: Hilda Lias, MD;  Location: [facility];  Service: Neurosurgery;  Laterality: Left;  Left L4-5 Foraminotomy/Left L5-S1 Diskectomy    Current Outpatient Rx  Name  Route  Sig  Dispense  Refill  . ACCU-CHEK SMARTVIEW test strip                 Dispense as written.   . Alcohol Swabs (ALCOHOL PREP) PADS               . amLODipine (NORVASC) 10 MG tablet   Oral   Take 1 tablet (10 mg total) by mouth daily.   90 tablet   0   . aspirin EC 81 MG tablet   Oral   Take 81 mg by mouth daily.         [name] azithromycin (ZITHROMAX Z-PAK) 250 MG  tablet      2 tabs po x day 1, then 1 tab po q day x 4 days   6 each   0   . fluconazole (DIFLUCAN) 150 MG tablet   Oral   Take 1 tablet (150 mg total) by mouth once.   2 tablet   0   . folic acid (FOLVITE) 400 MCG tablet   Oral   Take 800 mcg by mouth daily.          [name]. glipiZIDE (GLUCOTROL XL) 10 MG 24 hr tablet   Oral   Take 1 tablet (10 mg total) by mouth daily with breakfast.   90 tablet   0   . Insulin Glargine (LANTUS SOLOSTAR) 100 UNIT/ML Solostar Pen   Subcutaneous   Inject 40 Units into the skin daily at 10 pm. Patient taking differently: Inject 45 Units into the skin daily at 10 pm.    4 pen   PRN   . INVOKANA 300 MG TABS tablet   Oral   Take 1 tablet by mouth daily.            Dispense as written.   [name]. lisinopril (PRINIVIL,ZESTRIL) 10 MG tablet      TAKE 1 TABLET BY MOUTH DAILY   90 tablet   0   . nitrofurantoin, macrocrystal-monohydrate, (MACROBID) 100 MG capsule   Oral   Take 1 capsule (100 mg total) by mouth 2 (two) times daily.   14 capsule   0   . PARoxetine (PAXIL) 40 MG tablet   Oral   Take 1 tablet (40 mg total) by mouth daily.   90 tablet   0     Allergies Metformin and related; Benadryl; Cholesterol; Depakote er; Dilaudid; Diphenhydramine-zinc acetate; Haloperidol; Hydromorphone; Neosporin; Singulair; Sitagliptin; Statins; Sulfa antibiotics; and Victoza  Family History  Problem Relation Age of Onset  . COPD Mother   . Bipolar disorder Father   . Bipolar disorder Brother   . Diabetes Mellitus II Father   . Diabetes Mellitus II Brother     Social History Social History  Substance Use Topics  . Smoking status: Current Every Day Smoker -- 0.50 packs/day for 24 years    Types: Cigarettes  . Smokeless tobacco: None  . Alcohol Use: No     Comment: occasional- rare    Review of Systems  Constitutional: Negative for fever. Cardiovascular: Negative for chest pain. Respiratory: Negative for shortness of breath. Gastrointestinal: Negative for abdominal pain, vomiting and diarrhea. Neurological: Negative for headaches, focal weakness or numbness.  10-point ROS otherwise negative.  ____________________________________________   PHYSICAL EXAM:  VITAL SIGNS: ED Triage Vitals  Enc Vitals Group     BP 04/27/16 1519 94/53 mmHg     Pulse Rate 04/27/16 1519 83     Resp 04/27/16 1519 18     Temp 04/27/16 1519 98.3 F (36.8 C)     Temp Source 04/27/16 1519 Oral     SpO2 04/27/16 1519 97 %     Weight 04/27/16 1519 222 lb (100.699 kg)     Height 04/27/16 1519 5\' 5"  (1.651 m)   Constitutional: Alert and oriented. Well appearing and in no distress. Eyes: Conjunctivae are normal. PERRL. Normal extraocular movements. ENT    Head: Normocephalic and atraumatic.   Nose: No congestion/rhinnorhea.   Mouth/Throat: Mucous membranes are moist.   Neck: No stridor. Hematological/Lymphatic/Immunilogical: No cervical lymphadenopathy. Cardiovascular: Normal rate, regular rhythm.  No murmurs, rubs, or gallops. Respiratory: Normal respiratory effort without tachypnea  nor retractions. Breath sounds are clear and equal bilaterally. No wheezes/rales/rhonchi. Gastrointestinal: Soft and nontender. No distention. There is no CVA tenderness. Genitourinary: Deferred Musculoskeletal: Normal range of motion in all extremities. No joint effusions.  No lower extremity tenderness nor edema. Neurologic:  Normal speech and language. No gross focal neurologic deficits are appreciated.  Skin:  Skin is warm, dry and intact. No rash noted. Psychiatric: Mood and affect are normal. Speech and behavior are normal. Patient exhibits appropriate insight and judgment.  ____________________________________________    LABS (pertinent positives/negatives)  Labs Reviewed  BASIC METABOLIC PANEL - Abnormal; Notable for the following:    CO2 19 (*)    Glucose, Bld 180 (*)    BUN 25 (*)    Creatinine, Ser 1.21 (*)    GFR calc non Af Amer 50 (*)    GFR calc Af Amer 58 (*)    All other components within normal limits  CBC - Abnormal; Notable for the following:    WBC 12.6 (*)    All other components within normal limits  GLUCOSE, CAPILLARY - Abnormal; Notable for the following:    Glucose-Capillary 179 (*)    All other components within normal limits  TROPONIN I  URINALYSIS COMPLETEWITH MICROSCOPIC ([facility] ONLY)  CBG MONITORING, ED     ____________________________________________   EKG  I, Phineas Semen, attending physician, personally viewed and interpreted this EKG  EKG Time: 1523 Rate: 83 Rhythm: sinus rhythm with fusion complex Axis: left axis deviation Intervals: qtc 462 QRS: narrow ST changes: no st  elevation Impression: abnormal ekg ____________________________________________    RADIOLOGY  None  \___________________________________________   PROCEDURES  Procedure(s) performed: None  Critical Care performed: No  ____________________________________________   INITIAL IMPRESSION / ASSESSMENT AND PLAN / ED COURSE  Pertinent labs & imaging results that were available during my care of the patient were reviewed by me and considered in my medical decision making (see chart for details).  Patient presents to the emergency department today from work after a syncopal episode. Patient did not drink her normal amount. Patient does have a mild increase in her creatinine and BUN suggestive of dehydration. Will add on a troponin and blood work sent from triage. Although I doubt ACS given lack of chest pain. Will give patient fluids.  ----------------------------------------- 7:15 PM on 04/27/2016 -----------------------------------------  Troponin negative. Patient does feel better after fluids. I do think likely this is secondary to dehydration. Discussed the patient and encouraged plenty fall fluid intake. Encourage primary care follow-up. ____________________________________________   FINAL CLINICAL IMPRESSION(S) / ED DIAGNOSES  Final diagnoses:  Syncope, unspecified syncope type  Dehydration     Note: This dictation was prepared with Dragon dictation. Any transcriptional errors that result from this process are unintentional    Phineas Semen, MD 04/27/16 [phone]

## 2016-04-27 NOTE — ED Notes (Signed)
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 Pt states LOC w/o head injury. "All I remember was waking up on the floor". Pt presents alert and oriented x4.

## 2016-05-06 DIAGNOSIS — F17200 Nicotine dependence, unspecified, uncomplicated: Secondary | ICD-10-CM | POA: Diagnosis not present

## 2016-05-06 DIAGNOSIS — E1165 Type 2 diabetes mellitus with hyperglycemia: Secondary | ICD-10-CM | POA: Diagnosis not present

## 2016-05-06 DIAGNOSIS — Z794 Long term (current) use of insulin: Secondary | ICD-10-CM | POA: Diagnosis not present

## 2016-05-06 DIAGNOSIS — E782 Mixed hyperlipidemia: Secondary | ICD-10-CM | POA: Diagnosis not present

## 2016-05-19 ENCOUNTER — Telehealth: Payer: Self-pay | Admitting: Family Medicine

## 2016-05-19 NOTE — Telephone Encounter (Signed)
Debra [name] is checking status on a form request for a back brace that patient had requesting from them. She had faxed it in a couple weeks ago. Please fax the order to [phone]. She is also going to refax the form just in case you are not able to locate it.

## 2016-06-04 ENCOUNTER — Ambulatory Visit: Payer: Medicare Other | Admitting: Family Medicine

## 2016-06-04 NOTE — Telephone Encounter (Signed)
Spoke with patient and she stated that she did not request a back brace she already has one and since the surgery she is doing just fine and has scheduled a follow up appointment for next week

## 2016-06-15 ENCOUNTER — Encounter: Payer: Self-pay | Admitting: Family Medicine

## 2016-06-15 ENCOUNTER — Ambulatory Visit (INDEPENDENT_AMBULATORY_CARE_PROVIDER_SITE_OTHER): Payer: Managed Care, Other (non HMO) | Admitting: Family Medicine

## 2016-06-15 VITALS — BP 130/70 | HR 95 | Temp 98.7°F | Resp 16 | Ht 65.0 in | Wt 227.1 lb

## 2016-06-15 DIAGNOSIS — F3176 Bipolar disorder, in full remission, most recent episode depressed: Secondary | ICD-10-CM | POA: Diagnosis not present

## 2016-06-15 DIAGNOSIS — G4700 Insomnia, unspecified: Secondary | ICD-10-CM

## 2016-06-15 DIAGNOSIS — I10 Essential (primary) hypertension: Secondary | ICD-10-CM

## 2016-06-15 MED ORDER — PAROXETINE HCL 40 MG PO TABS: 40.0000 mg | ORAL_TABLET | Freq: Every day | ORAL | Status: DC

## 2016-06-15 MED ORDER — LISINOPRIL 10 MG PO TABS: 10.0000 mg | ORAL_TABLET | Freq: Every day | ORAL | Status: DC

## 2016-06-15 MED ORDER — TRAZODONE HCL 50 MG PO TABS: 50.0000 mg | ORAL_TABLET | Freq: Every day | ORAL | Status: DC

## 2016-06-15 NOTE — Progress Notes (Signed)
Name: Debra [name]   MRN: [id number]    DOB: Apr 16, 1961   Date:06/15/2016       Progress Note  Subjective  Chief Complaint  Chief Complaint  Patient presents with  . Follow-up    Insomnia Primary symptoms: sleep disturbance, difficulty falling asleep.  The problem occurs nightly. The problem has been gradually worsening since onset. The symptoms are aggravated by pain. Past treatments include medication (Has been on Trazodone in the past for insomnia, would like to go back on it). PMH includes: depression.  Hypertension This is a chronic problem. The problem is unchanged. The problem is controlled. Pertinent negatives include no blurred vision, chest pain or headaches. Past treatments include ACE inhibitors. There is no history of kidney disease, CAD/MI or CVA.     Past Medical History  Diagnosis Date  . Hypertension   . Insomnia   . Anxiety   . Depression   . Hyperlipidemia   . Obesity   . Tobacco abuse   . Family history of adverse reaction to anesthesia     Mother - BP drops  . Diabetes mellitus without complication (HCC)     Type II  . Head injury, closed, with brief LOC (HCC) 2011ish  . Seasonal allergies   . Arthritis     Past Surgical History  Procedure Laterality Date  . Cholecystectomy  2013  . Abdominal hysterectomy  2012  . Cesarean section  1992  . Lumbar laminectomy/decompression microdiscectomy Left 09/10/2015    Procedure: Left L4-5 Foraminotomy/Left L5-S1 Diskectomy;  Surgeon: Hilda LiasErnesto Botero, MD;  Location: [facility];  Service: Neurosurgery;  Laterality: Left;  Left L4-5 Foraminotomy/Left L5-S1 Diskectomy    Family History  Problem Relation Age of Onset  . COPD Mother   . Bipolar disorder Father   . Bipolar disorder Brother   . Diabetes Mellitus II Father   . Diabetes Mellitus II Brother     Social History   Social History  . Marital Status: Married    Spouse Name: N/A  . Number of Children: N/A  . Years of Education: N/A   Occupational  History  . Not on file.   Social History Main Topics  . Smoking status: Current Every Day Smoker -- 0.50 packs/day for 24 years    Types: Cigarettes  . Smokeless tobacco: Not on file  . Alcohol Use: No     Comment: occasional- rare  . Drug Use: No  . Sexual Activity: Not on file   Other Topics Concern  . Not on file   Social History Narrative     Current outpatient prescriptions:  .  ACCU-CHEK SMARTVIEW test strip, , Disp: , Rfl:  .  Alcohol Swabs (ALCOHOL PREP) PADS, , Disp: , Rfl:  .  aspirin EC 81 MG tablet, Take 81 mg by mouth daily., Disp: , Rfl:  .  folic acid (FOLVITE) 400 MCG tablet, Take 800 mcg by mouth daily. , Disp: , Rfl:  .  glipiZIDE (GLUCOTROL XL) 10 MG 24 hr tablet, Take 1 tablet (10 mg total) by mouth daily with breakfast., Disp: 90 tablet, Rfl: 0 .  insulin aspart protamine- aspart (NOVOLOG MIX 70/30) (70-30) 100 UNIT/ML injection, , Disp: , Rfl:  .  Insulin Glargine (LANTUS SOLOSTAR) 100 UNIT/ML Solostar Pen, Inject 40 Units into the skin daily at 10 pm. (Patient taking differently: Inject 45 Units into the skin daily at 10 pm. ), Disp: 4 pen, Rfl: PRN .  lisinopril (PRINIVIL,ZESTRIL) 10 MG tablet, TAKE 1 TABLET  BY MOUTH DAILY, Disp: 90 tablet, Rfl: 0 .  PARoxetine (PAXIL) 40 MG tablet, Take 1 tablet (40 mg total) by mouth daily., Disp: 90 tablet, Rfl: 0  Allergies  Allergen Reactions  . Metformin And Related Diarrhea  . Benadryl [Diphenhydramine Hcl] Swelling    blistering  . Canagliflozin Other (See Comments)    Low bp  . Cholesterol Other (See Comments)    meds cause her to pass out  . Depakote Er [Divalproex Sodium Er] Other (See Comments)    seizures  . Dilaudid [Hydromorphone Hcl] Other (See Comments)    seizures  . Diphenhydramine-Zinc Acetate Other (See Comments)  . Haloperidol Other (See Comments)  . Hydromorphone Other (See Comments)  . Neosporin [Neomycin-Bacitracin Zn-Polymyx] Swelling    blistering  . Singulair [Montelukast Sodium]  Other (See Comments)    Asthma attacks  . Sitagliptin Other (See Comments)  . Statins Other (See Comments)  . Sulfa Antibiotics Hives  . Victoza [Liraglutide] Other (See Comments)    pancreatitis     Review of Systems  Eyes: Negative for blurred vision.  Cardiovascular: Negative for chest pain.  Neurological: Negative for headaches.  Psychiatric/Behavioral: Positive for depression and sleep disturbance. The patient has insomnia.     Objective  Filed Vitals:   06/15/16 1610  BP: 130/70  Pulse: 95  Temp: 98.7 F (37.1 C)  TempSrc: Oral  Resp: 16  Height:  (1.651 m)  Weight: 227 lb 1.6 oz (103.012 kg)  SpO2: 95%    Physical Exam  Constitutional: She is oriented to person, place, and time and well-developed, well-nourished, and in no distress.  HENT:  Head: Normocephalic and atraumatic.  Cardiovascular: Normal rate, regular rhythm, S1 normal, S2 normal and normal heart sounds.   No murmur heard. Pulmonary/Chest: Effort normal and breath sounds normal. No respiratory distress. She has no wheezes. She has no rales.  Musculoskeletal: She exhibits no edema.  Neurological: She is alert and oriented to person, place, and time.  Psychiatric: Mood, memory, affect and judgment normal.  Nursing note and vitals reviewed.      Assessment & Plan  1. Insomnia Started on trazodone 50 mg at bedtime - traZODone (DESYREL) 50 MG tablet; Take 1 tablet (50 mg total) by mouth at bedtime.  Dispense: 90 tablet; Refill: 1  2. Essential hypertension BP stable and controlled on present antihypertensive therapy - lisinopril (PRINIVIL,ZESTRIL) 10 MG tablet; Take 1 tablet (10 mg total) by mouth daily.  Dispense: 90 tablet; Refill: 1  3. Bipolar disorder, in full remission, most recent episode depressed (HCC)  - PARoxetine (PAXIL) 40 MG tablet; Take 1 tablet (40 mg total) by mouth daily.  Dispense: 90 tablet; Refill: 1    Sunil Hue [facility] 06/15/2016 5:19 PM

## 2016-09-07 ENCOUNTER — Emergency Department: Payer: Managed Care, Other (non HMO)

## 2016-09-07 ENCOUNTER — Encounter: Payer: Self-pay | Admitting: *Deleted

## 2016-09-07 ENCOUNTER — Emergency Department
Admission: EM | Admit: 2016-09-07 | Discharge: 2016-09-07 | Disposition: A | Discharge status: Home / Self Care | Payer: Managed Care, Other (non HMO) | Attending: Emergency Medicine | Admitting: Emergency Medicine

## 2016-09-07 ENCOUNTER — Ambulatory Visit (INDEPENDENT_AMBULATORY_CARE_PROVIDER_SITE_OTHER): Payer: Managed Care, Other (non HMO) | Admitting: Family Medicine

## 2016-09-07 ENCOUNTER — Encounter: Payer: Self-pay | Admitting: Family Medicine

## 2016-09-07 VITALS — BP 160/100 | HR 92 | Temp 98.5°F | Resp 18 | Ht 65.0 in | Wt 235.4 lb

## 2016-09-07 DIAGNOSIS — L03315 Cellulitis of perineum: Secondary | ICD-10-CM | POA: Diagnosis not present

## 2016-09-07 DIAGNOSIS — R250 Abnormal head movements: Secondary | ICD-10-CM | POA: Insufficient documentation

## 2016-09-07 DIAGNOSIS — E1169 Type 2 diabetes mellitus with other specified complication: Secondary | ICD-10-CM | POA: Diagnosis not present

## 2016-09-07 DIAGNOSIS — Z79899 Other long term (current) drug therapy: Secondary | ICD-10-CM | POA: Insufficient documentation

## 2016-09-07 DIAGNOSIS — Z794 Long term (current) use of insulin: Secondary | ICD-10-CM

## 2016-09-07 DIAGNOSIS — E119 Type 2 diabetes mellitus without complications: Secondary | ICD-10-CM | POA: Insufficient documentation

## 2016-09-07 DIAGNOSIS — F17210 Nicotine dependence, cigarettes, uncomplicated: Secondary | ICD-10-CM | POA: Diagnosis not present

## 2016-09-07 DIAGNOSIS — I10 Essential (primary) hypertension: Secondary | ICD-10-CM | POA: Diagnosis not present

## 2016-09-07 DIAGNOSIS — R300 Dysuria: Secondary | ICD-10-CM | POA: Diagnosis not present

## 2016-09-07 DIAGNOSIS — Z7982 Long term (current) use of aspirin: Secondary | ICD-10-CM | POA: Insufficient documentation

## 2016-09-07 DIAGNOSIS — M62838 Other muscle spasm: Secondary | ICD-10-CM | POA: Diagnosis not present

## 2016-09-07 DIAGNOSIS — I160 Hypertensive urgency: Secondary | ICD-10-CM | POA: Diagnosis not present

## 2016-09-07 DIAGNOSIS — L739 Follicular disorder, unspecified: Secondary | ICD-10-CM | POA: Diagnosis not present

## 2016-09-07 LAB — CBC
HEMATOCRIT: 44.2 % (ref 35.0–47.0)
HEMOGLOBIN: 15.4 g/dL (ref 12.0–16.0)
MCH: 32.4 pg (ref 26.0–34.0)
MCHC: 34.8 g/dL (ref 32.0–36.0)
MCV: 93 fL (ref 80.0–100.0)
Platelets: 267 10*3/uL (ref 150–440)
RBC: 4.75 MIL/uL (ref 3.80–5.20)
RDW: 12.8 % (ref 11.5–14.5)
WBC: 11.3 10*3/uL — ABNORMAL HIGH (ref 3.6–11.0)

## 2016-09-07 LAB — BASIC METABOLIC PANEL
ANION GAP: 8 (ref 5–15)
BUN: 22 mg/dL — ABNORMAL HIGH (ref 6–20)
CHLORIDE: 103 mmol/L (ref 101–111)
CO2: 26 mmol/L (ref 22–32)
Calcium: 9.6 mg/dL (ref 8.9–10.3)
Creatinine, Ser: 0.65 mg/dL (ref 0.44–1.00)
GFR calc non Af Amer: >60 mL/min (ref >60)
Glucose, Bld: 138 mg/dL — ABNORMAL HIGH (ref 65–99)
Potassium: 4.4 mmol/L (ref 3.5–5.1)
Sodium: 137 mmol/L (ref 135–145)

## 2016-09-07 LAB — POCT URINALYSIS DIPSTICK
BILIRUBIN UA: NEGATIVE
Blood, UA: NEGATIVE
Glucose, UA: NEGATIVE
KETONES UA: NEGATIVE
LEUKOCYTES UA: NEGATIVE
Nitrite, UA: NEGATIVE
PH UA: 5
Protein, UA: NEGATIVE
SPEC GRAV UA: 1.02
Urobilinogen, UA: NEGATIVE

## 2016-09-07 LAB — URINALYSIS COMPLETE WITH MICROSCOPIC (ARMC ONLY)
BILIRUBIN URINE: NEGATIVE
Glucose, UA: NEGATIVE mg/dL
HGB URINE DIPSTICK: NEGATIVE
Ketones, ur: NEGATIVE mg/dL
Nitrite: NEGATIVE
PH: 5 (ref 5.0–8.0)
Protein, ur: NEGATIVE mg/dL
Specific Gravity, Urine: 1.021 (ref 1.005–1.030)

## 2016-09-07 LAB — TROPONIN I: Troponin I: <0.03 ng/mL (ref <0.03)

## 2016-09-07 LAB — GLUCOSE, POCT (MANUAL RESULT ENTRY): POC Glucose: 193 mg/dl — AB (ref 70–99)

## 2016-09-07 MED ORDER — LORAZEPAM 1 MG PO TABS
ORAL_TABLET | ORAL | Status: AC
  Administered 2016-09-07: 2 mg via ORAL
  Filled 2016-09-07: qty 2

## 2016-09-07 MED ORDER — LORAZEPAM 1 MG PO TABS
2.0000 mg | ORAL_TABLET | Freq: Once | ORAL | Status: AC
  Administered 2016-09-07: 2 mg via ORAL

## 2016-09-07 MED ORDER — CEPHALEXIN 500 MG PO CAPS: 500.0000 mg | ORAL_CAPSULE | Freq: Four times a day (QID) | ORAL | 0 refills | Status: AC

## 2016-09-07 NOTE — Discharge Instructions (Signed)
Please drink fluids. He may require referral to a neurologist for further evaluation of the head bobbing or any other neurologic symptoms. Head CT did not show any indications of a mass or any intracranial event however MRI as an outpatient may be necessary and/or referral to a neurologist. Elevated blood sugar may be secondary to mild cellulitis.  Please return immediately if condition worsens. Please contact her primary physician or the physician you were given for referral. If you have any specialist physicians involved in her treatment and plan please also contact them. Thank you for using [facility].

## 2016-09-07 NOTE — Progress Notes (Signed)
Name: Debra [name]   MRN: [id number]    DOB: 12-13-1960   Date:09/07/2016       Progress Note  Subjective  Chief Complaint  Chief Complaint  Patient presents with  . Urinary Tract Infection  . Hypertension    bp running 147/83 the weekend, left side of face numb and left side trembling  . Diabetes    BS running 350 to 435 over the weekend     HPI  Pt. Presents with multiple symptoms including light-headedness, shaking of the right arm and hand, and dizziness. She is an insulin dependent Diabetic and sees Endocrinology, has tried multiple diabetic regimens but currently on Lantus 50 units bid and Glipizide XR 10 mg daily. Reports her blood sugar has been as high as 435mg /Dl on Saturday, on Sunday was over 300 mg/dL. No change in diet, exercise or activity routine and she reprots taking her medication as prescribed. She is also complaining of low back pain stable and at baseline (had back surgery last year).   Past Medical History:  Diagnosis Date  . Anxiety   . Arthritis   . Depression   . Diabetes mellitus without complication (HCC)    Type II  . Family history of adverse reaction to anesthesia    Mother - BP drops  . Head injury, closed, with brief LOC (HCC) 2011ish  . Hyperlipidemia   . Hypertension   . Insomnia   . Obesity   . Seasonal allergies   . Tobacco abuse     Past Surgical History:  Procedure Laterality Date  . ABDOMINAL HYSTERECTOMY  2012  . CESAREAN SECTION  1992  . CHOLECYSTECTOMY  2013  . LUMBAR LAMINECTOMY/DECOMPRESSION MICRODISCECTOMY Left 09/10/2015   Procedure: Left L4-5 Foraminotomy/Left L5-S1 Diskectomy;  Surgeon: Hilda Lias, MD;  Location: [facility];  Service: Neurosurgery;  Laterality: Left;  Left L4-5 Foraminotomy/Left L5-S1 Diskectomy    Family History  Problem Relation Age of Onset  . COPD Mother   . Bipolar disorder Father   . Bipolar disorder Brother   . Diabetes Mellitus II Father   . Diabetes Mellitus II Brother     Social  History   Social History  . Marital status: Married    Spouse name: N/A  . Number of children: N/A  . Years of education: N/A   Occupational History  . Not on file.   Social History Main Topics  . Smoking status: Current Every Day Smoker    Packs/day: 0.50    Years: 24.00    Types: Cigarettes  . Smokeless tobacco: Not on file  . Alcohol use No     Comment: occasional- rare  . Drug use: No  . Sexual activity: Not on file   Other Topics Concern  . Not on file   Social History Narrative  . No narrative on file     Current Outpatient Prescriptions:  .  ACCU-CHEK SMARTVIEW test strip, , Disp: , Rfl:  .  Alcohol Swabs (ALCOHOL PREP) PADS, , Disp: , Rfl:  .  aspirin EC 81 MG tablet, Take 81 mg by mouth daily., Disp: , Rfl:  .  folic acid (FOLVITE) 400 MCG tablet, Take 800 mcg by mouth daily. , Disp: , Rfl:  .  glipiZIDE (GLUCOTROL XL) 10 MG 24 hr tablet, Take 1 tablet (10 mg total) by mouth daily with breakfast., Disp: 90 tablet, Rfl: 0 .  insulin aspart protamine- aspart (NOVOLOG MIX 70/30) (70-30) 100 UNIT/ML injection, , Disp: , Rfl:  .  lisinopril (PRINIVIL,ZESTRIL) 10 MG tablet, Take 1 tablet (10 mg total) by mouth daily., Disp: 90 tablet, Rfl: 1 .  PARoxetine (PAXIL) 40 MG tablet, Take 1 tablet (40 mg total) by mouth daily., Disp: 90 tablet, Rfl: 1 .  traZODone (DESYREL) 50 MG tablet, Take 1 tablet (50 mg total) by mouth at bedtime., Disp: 90 tablet, Rfl: 1  Allergies  Allergen Reactions  . Metformin And Related Diarrhea  . Benadryl [Diphenhydramine Hcl] Swelling    blistering  . Canagliflozin Other (See Comments)    Low bp  . Cholesterol Other (See Comments)    meds cause her to pass out  . Depakote Er [Divalproex Sodium Er] Other (See Comments)    seizures  . Dilaudid [Hydromorphone Hcl] Other (See Comments)    seizures  . Diphenhydramine-Zinc Acetate Other (See Comments)  . Haloperidol Other (See Comments)  . Hydromorphone Other (See Comments)  . Neosporin  [Neomycin-Bacitracin Zn-Polymyx] Swelling    blistering  . Singulair [Montelukast Sodium] Other (See Comments)    Asthma attacks  . Sitagliptin Other (See Comments)  . Statins Other (See Comments)  . Sulfa Antibiotics Hives  . Victoza [Liraglutide] Other (See Comments)    pancreatitis     Review of Systems  Constitutional: Positive for malaise/fatigue. Negative for chills, fever and weight loss.  Cardiovascular: Negative for chest pain.  Gastrointestinal: Negative for abdominal pain.  Genitourinary: Negative for dysuria, frequency and hematuria.  Musculoskeletal: Positive for back pain.  Neurological: Positive for dizziness and tremors. Negative for seizures and loss of consciousness.    Objective  Vitals:   09/07/16 1454  BP: (!) 140/100  Pulse: 92  Resp: 18  Temp: 98.5 F (36.9 C)  TempSrc: Oral  SpO2: 94%  Weight: 235 lb 6.4 oz (106.8 kg)  Height: 5\' 5"  (1.651 m)    Physical Exam  Constitutional: She is oriented to person, place, and time and well-developed, well-nourished, and in no distress.  HENT:  Head: Normocephalic and atraumatic.  Cardiovascular: Normal rate, regular rhythm, S1 normal, S2 normal and normal heart sounds.   No murmur heard. Pulmonary/Chest: Effort normal and breath sounds normal.  Abdominal: Soft. Bowel sounds are normal. There is no tenderness.  Musculoskeletal:       Right ankle: She exhibits no swelling. Tenderness.       Left ankle: She exhibits no swelling. Tenderness.  Neurological: She is alert and oriented to person, place, and time. She has normal strength and intact cranial nerves.  Psychiatric: Mood, memory, affect and judgment normal.  Nursing note and vitals reviewed.      Recent Results (from the past 2160 hour(s))  POCT urinalysis dipstick     Status: None   Collection Time: 09/07/16  3:00 PM  Result Value Ref Range   Color, UA yellow    Clarity, UA cloudy    Glucose, UA negative    Bilirubin, UA negative     Ketones, UA negative    Spec Grav, UA 1.020    Blood, UA negative    pH, UA 5.0    Protein, UA negative    Urobilinogen, UA negative    Nitrite, UA negative    Leukocytes, UA Negative Negative     Assessment & Plan  1. Dysuria Point-of-care urinalysis shows no evidence of UTI - POCT urinalysis dipstick  2. Type 2 diabetes mellitus with other specified complication, with long-term current use of insulin (HCC) Elevated glucose, followed by Endocrinology. - POCT Glucose (CBG)  3. Hypertensive urgency Repeat  blood pressure elevated, concern for hypertensive urgency based on symptoms, will send patient to the ER   Melika Reder Asad A. Faylene KurtzShah [facility] 09/07/2016 3:08 PM

## 2016-09-07 NOTE — ED Notes (Signed)
Pt alert and oriented X4, active, cooperative, pt in NAD. RR even and unlabored, color WNL.  Pt informed to return if any life threatening symptoms occur.   

## 2016-09-07 NOTE — ED Provider Notes (Signed)
Time Seen: Approximately 1718  I have reviewed the triage notes  Chief Complaint: Hypertension   History of Present Illness: Debra [name] is a 55 y.o. female who presents after referral from her primary physician's office for evaluation of hyperglycemia along with hypertension. Patient states that she's also had this "" head-bobbing "" for the last 3 days. Denies any significant headache or neck discomfort. She denies focal weakness in either upper or lower extremities. No difficulty with speech or swallowing. She is not aware of any obvious exacerbating or relieving factors and also of note has what seems to be a muscle twitch of the left side of her mouth at the bedside. No loss of consciousness or seizure activity. States her blood sugars been running high with unknown source and states no fever at home. She denies any chest pain or shortness of breath, productive cough, or any other new concerns. Later during her stay she noted that she had some skin lesions and points mainly to the suprapubic region. She states he normally will "" drain unknown "".   Past Medical History:  Diagnosis Date  . Anxiety   . Arthritis   . Depression   . Diabetes mellitus without complication (HCC)    Type II  . Family history of adverse reaction to anesthesia    Mother - BP drops  . Head injury, closed, with brief LOC (HCC) 2011ish  . Hyperlipidemia   . Hypertension   . Insomnia   . Obesity   . Seasonal allergies   . Tobacco abuse     Patient Active Problem List   Diagnosis Date Noted  . Insomnia 06/15/2016  . Oral candidiasis 12/11/2015  . Urinary tract infection symptoms 12/11/2015  . Need for immunization against influenza 08/28/2015  . Acute low back pain with radicular symptoms, duration less than 6 weeks 08/28/2015  . Need for vaccination 08/21/2015  . Lumbar disc disease with radiculopathy 08/20/2015  . Lumbar herniated disc 08/20/2015  . Uncontrolled diabetes mellitus (HCC) 08/20/2015   . Left-sided low back pain with sciatica 06/03/2015  . Abdominal gas pain 06/03/2015  . Abdominal pain, generalized 06/03/2015  . Bipolar disorder (HCC) 06/03/2015  . Insomnia, persistent 06/03/2015  . BP (high blood pressure) 06/03/2015  . Type 2 diabetes mellitus treated with insulin (HCC) 06/03/2015  . Leg swelling 06/03/2015  . Extreme obesity (HCC) 06/03/2015  . Compulsive tobacco user syndrome 06/03/2015  . Vaginitis due to Candida 06/03/2015  . Phlebectasia 06/03/2015    Past Surgical History:  Procedure Laterality Date  . ABDOMINAL HYSTERECTOMY  2012  . CESAREAN SECTION  1992  . CHOLECYSTECTOMY  2013  . LUMBAR LAMINECTOMY/DECOMPRESSION MICRODISCECTOMY Left 09/10/2015   Procedure: Left L4-5 Foraminotomy/Left L5-S1 Diskectomy;  Surgeon: Hilda Lias, MD;  Location: [facility];  Service: Neurosurgery;  Laterality: Left;  Left L4-5 Foraminotomy/Left L5-S1 Diskectomy    Past Surgical History:  Procedure Laterality Date  . ABDOMINAL HYSTERECTOMY  2012  . CESAREAN SECTION  1992  . CHOLECYSTECTOMY  2013  . LUMBAR LAMINECTOMY/DECOMPRESSION MICRODISCECTOMY Left 09/10/2015   Procedure: Left L4-5 Foraminotomy/Left L5-S1 Diskectomy;  Surgeon: Hilda Lias, MD;  Location: [facility];  Service: Neurosurgery;  Laterality: Left;  Left L4-5 Foraminotomy/Left L5-S1 Diskectomy    Current Outpatient Rx  . Order #: [id number] Class: Historical Med  . Order #: [id number] Class: Historical Med  . Order #: [id number] Class: Historical Med  . Order #: [id number] Class: Print  . Order #: [id number] Class: Historical Med  . Order #: [id number] Class: Normal  .  Order #: [id number] Class: Historical Med  . Order #: [id number] Class: Normal  . Order #: [id number] Class: Normal  . Order #: [id number] Class: Normal    Allergies:  Metformin and related; Benadryl [diphenhydramine hcl]; Canagliflozin; Cholesterol; Depakote er [divalproex sodium er]; Dilaudid [hydromorphone hcl]; Diphenhydramine-zinc acetate;  Haloperidol; Hydromorphone; Neosporin [neomycin-bacitracin zn-polymyx]; Singulair [montelukast sodium]; Sitagliptin; Statins; Sulfa antibiotics; and Victoza [liraglutide]  Family History: Family History  Problem Relation Age of Onset  . COPD Mother   . Bipolar disorder Father   . Diabetes Mellitus II Father   . Bipolar disorder Brother   . Diabetes Mellitus II Brother     Social History: Social History  Substance Use Topics  . Smoking status: Current Every Day Smoker    Packs/day: 0.50    Years: 24.00    Types: Cigarettes  . Smokeless tobacco: Never Used  . Alcohol use No     Comment: occasional- rare     Review of Systems:   10 point review of systems was performed and was otherwise negative:  Constitutional: No fever Eyes: No visual disturbances ENT: No sore throat, ear pain Cardiac: No chest pain Respiratory: No shortness of breath, wheezing, or stridor Abdomen: No abdominal pain, no vomiting, No diarrhea Endocrine: No weight loss, No night sweats Extremities: No peripheral edema, cyanosis Skin: No rashes, easy bruising Neurologic: No focal weakness, trouble with speech or swollowing Urologic: No dysuria, Hematuria, or urinary frequency   Physical Exam:  ED Triage Vitals  Enc Vitals Group     BP 09/07/16 1622 137/73     Pulse Rate 09/07/16 1619 84     Resp 09/07/16 1619 20     Temp 09/07/16 1619 98.2 F (36.8 C)     Temp Source 09/07/16 1619 Oral     SpO2 09/07/16 1619 97 %     Weight 09/07/16 1621 235 lb (106.6 kg)     Height 09/07/16 1621 5\' 5"  (1.651 m)     Head Circumference --      Peak Flow --      Pain Score 09/07/16 1856 0     Pain Loc --      Pain Edu? --      Excl. in GC? --     General: Awake , Alert , and Oriented times 3; GCS 15 Head: Normal cephalic , atraumatic Eyes: Pupils equal , round, reactive to light Nose/Throat: No nasal drainage, patent upper airway without erythema or exudate.  Neck: Supple, Full range of motion, No  anterior adenopathy or palpable thyroid masses Lungs: Clear to ascultation without wheezes , rhonchi, or rales Heart: Regular rate, regular rhythm without murmurs , gallops , or rubs Abdomen: Soft, non tender without rebound, guarding , or rigidity; bowel sounds positive and symmetric in all 4 quadrants. No organomegaly .        Extremities: 2 plus symmetric pulses. No edema, clubbing or cyanosis Neurologic: normal ambulation, Motor symmetric without deficits, sensory intact. . Orbital muscle twitches Skin: Examination is suprapubic region shows what appears to be 2 very small nondraining abscess is with some mild surrounding erythema. Lesions are approximately centimeter in size   Labs:   All laboratory work was reviewed including any pertinent negatives or positives listed below:  Labs Reviewed  BASIC METABOLIC PANEL - Abnormal; Notable for the following:       Result Value   Glucose, Bld 138 (*)    BUN 22 (*)    All other components within normal limits  CBC - Abnormal; Notable  for the following:    WBC 11.3 (*)    All other components within normal limits  URINALYSIS COMPLETEWITH MICROSCOPIC ([facility] ONLY) - Abnormal; Notable for the following:    Color, Urine YELLOW (*)    APPearance HAZY (*)    Leukocytes, UA TRACE (*)    Bacteria, UA RARE (*)    Squamous Epithelial / LPF 6-30 (*)    All other components within normal limits  TROPONIN I  Review laboratory work shows no significant abnormalities  EKG: * ED ECG REPORT I, Jennye MoccasinBrian S Quigley, the attending physician, personally viewed and interpreted this ECG.  Date: 09/07/2016 EKG Time: 1728 Rate:70 Rhythm: normal sinus rhythm QRS Axis: normal Intervals: normal ST/T Wave abnormalities: normal Conduction Disturbances: none Narrative Interpretation: unremarkable Normal EKG   Radiology:  "Ct Head Wo Contrast  Result Date: 09/07/2016 CLINICAL DATA:  Uncontrolled head bobbing for 3 days. EXAM: CT HEAD WITHOUT CONTRAST  TECHNIQUE: Contiguous axial images were obtained from the base of the skull through the vertex without intravenous contrast. COMPARISON:  03/16/2014. FINDINGS: Brain: No evidence of acute infarction, hemorrhage, hydrocephalus, extra-axial collection or mass lesion/mass effect. Vascular: No hyperdense vessel or unexpected calcification. Skull: Normal. Negative for fracture or focal lesion. Sinuses/Orbits: No acute finding. Other: None. IMPRESSION: Negative. Electronically Signed   By: Leanna BattlesMelinda  Blietz M.D.   On: 09/07/2016 16:58  "    I personally reviewed the radiologic studies    ED Course:  Patient's stay here was uneventful and I had no clear diagnosis for her "" head-bobbing "" or muscle twitches at this time. She was given some Ativan here in emergency department with some mild symptomatic relief. I advised her I felt we could treat it and in analyze it more on an outpatient basis with referral to neurology through her primary physician. She does not appear to have any intracranial lesions and I don't suspect acute cerebrovascular accident. The patient is high blood sugar may be secondary to the lesions on her abdomen but otherwise I couldn't find any other source for her hyperglycemia and her blood pressure seemed to correct itself just with observation.* Clinical Course     Assessment:  Folliculitis Muscle spasm unknown etiology   Final Clinical Impression:   Final diagnoses:  Cellulitis of perineum     Plan: Outpatient Patient was advised to return immediately if condition worsens. Patient was advised to follow up with their primary care physician or other specialized physicians involved in their outpatient care. The patient and/or family member/power of attorney had laboratory results reviewed at the bedside. All questions and concerns were addressed and appropriate discharge instructions were distributed by the nursing staff.            Jennye MoccasinBrian S Quigley, MD 09/07/16  2156

## 2016-09-07 NOTE — ED Notes (Signed)
Reports high blood sugars and high blood pressure. PT seen at her PCP this AM for possible UTI. States that she was told her urinalysis was clean. Pt alert and oriented X4, active, cooperative, pt in NAD. RR even and unlabored, color WNL.

## 2016-09-07 NOTE — ED Triage Notes (Addendum)
Pt sent to er for eval from Dr. Sherryll BurgerShah.  Pt has high blood pressure and glucose at office of 193.  Pt states she has not felt well for several days.   No n/v/d.  No chest pain or sob.  Pt now reports head bobbing for 3 days.    Pt alert   Speech clear.

## 2016-09-09 ENCOUNTER — Telehealth: Payer: Self-pay | Admitting: Family Medicine

## 2016-09-09 NOTE — Telephone Encounter (Signed)
At last visit patient was sent to ER due to blood pressure. They did not give her anything to lower it. It is now 160/90 (was taken about ago) and she would like to know if they is high. She had taken her other medication before taking her bp.

## 2016-09-09 NOTE — Telephone Encounter (Signed)
Patient's blood pressure is 140s over high 80s at this time, recommended to increase lisinopril to 20 mg daily, we'll evaluate at her follow-up appointment on October 12 th.

## 2016-09-10 ENCOUNTER — Encounter: Payer: Self-pay | Admitting: Family Medicine

## 2016-09-21 ENCOUNTER — Ambulatory Visit (INDEPENDENT_AMBULATORY_CARE_PROVIDER_SITE_OTHER): Payer: Medicare Other | Admitting: Family Medicine

## 2016-09-21 ENCOUNTER — Ambulatory Visit: Payer: Self-pay

## 2016-09-21 ENCOUNTER — Ambulatory Visit (INDEPENDENT_AMBULATORY_CARE_PROVIDER_SITE_OTHER): Payer: Managed Care, Other (non HMO) | Admitting: Family Medicine

## 2016-09-21 VITALS — BP 124/60 | HR 80 | Temp 98.0°F | Ht 64.25 in | Wt 238.1 lb

## 2016-09-21 DIAGNOSIS — Z1231 Encounter for screening mammogram for malignant neoplasm of breast: Secondary | ICD-10-CM

## 2016-09-21 DIAGNOSIS — Z1211 Encounter for screening for malignant neoplasm of colon: Secondary | ICD-10-CM

## 2016-09-21 DIAGNOSIS — Z23 Encounter for immunization: Secondary | ICD-10-CM

## 2016-09-21 DIAGNOSIS — Z1159 Encounter for screening for other viral diseases: Secondary | ICD-10-CM

## 2016-09-21 DIAGNOSIS — Z0000 Encounter for general adult medical examination without abnormal findings: Secondary | ICD-10-CM | POA: Diagnosis not present

## 2016-09-21 DIAGNOSIS — Z114 Encounter for screening for human immunodeficiency virus [HIV]: Secondary | ICD-10-CM

## 2016-09-21 NOTE — Patient Instructions (Signed)
[name] , Thank you for taking time to come for your Medicare Wellness Visit. I appreciate your ongoing commitment to your health goals. Please review the following plan we discussed and let me know if I can assist you in the future.   These are the goals we discussed: Goals    . Increase water intake          Starting 09/21/16, I will continue to drink 8 or more glasses of water.       This is a list of the screening recommended for you and due dates:  Health Maintenance  Topic Date Due  .  Hepatitis C: One time screening is recommended by Center for Disease Control  (CDC) for  adults born from [birth year] through 1964.   Sep 11, 1961  . HIV Screening  11/03/1977  . Mammogram  11/03/2012  . Colon Cancer Screening  11/03/2012  . Pap Smear  09/21/2026*  . Eye exam for diabetics  10/07/2016  . Hemoglobin A1C  01/07/2017  . Complete foot exam   07/07/2017  . Pneumococcal vaccine (2) 07/03/2019  . Tetanus Vaccine  07/02/2024  . Flu Shot  Completed  *Topic was postponed. The date shown is not the original due date.   Preventive Care for Adults  A healthy lifestyle and preventive care can promote health and wellness. Preventive health guidelines for adults include the following key practices.  . A routine yearly physical is a good way to check with your health care provider about your health and preventive screening. It is a chance to share any concerns and updates on your health and to receive a thorough exam.  . Visit your dentist for a routine exam and preventive care every 6 months. Brush your teeth twice a day and floss once a day. Good oral hygiene prevents tooth decay and gum disease.  . The frequency of eye exams is based on your age, health, family medical history, use  of contact lenses, and other factors. Follow your health care provider's ecommendations for frequency of eye exams.  . Eat a healthy diet. Foods like vegetables, fruits, whole grains, low-fat dairy products, and  lean protein foods contain the nutrients you need without too many calories. Decrease your intake of foods high in solid fats, added sugars, and salt. Eat the right amount of calories for you. Get information about a proper diet from your health care provider, if necessary.  . Regular physical exercise is one of the most important things you can do for your health. Most adults should get at least 150 minutes of moderate-intensity exercise (any activity that increases your heart rate and causes you to sweat) each week. In addition, most adults need muscle-strengthening exercises on 2 or more days a week.  Silver Sneakers may be a benefit available to you. To determine eligibility, you may visit the website: www.silversneakers.com or contact program at [phone] Mon-Fri between 8AM-8PM.   . Maintain a healthy weight. The body mass index (BMI) is a screening tool to identify possible weight problems. It provides an estimate of body fat based on height and weight. Your health care provider can find your BMI and can help you achieve or maintain a healthy weight.   For adults 20 years and older: ? A BMI below 18.5 is considered underweight. ? A BMI of 18.5 to 24.9 is normal. ? A BMI of 25 to 29.9 is considered overweight. ? A BMI of 30 and above is considered obese.   . Maintain normal  blood lipids and cholesterol levels by exercising and minimizing your intake of saturated fat. Eat a balanced diet with plenty of fruit and vegetables. Blood tests for lipids and cholesterol should begin at age 88 and be repeated every 5 years. If your lipid or cholesterol levels are high, you are over 50, or you are at high risk for heart disease, you may need your cholesterol levels checked more frequently. Ongoing high lipid and cholesterol levels should be treated with medicines if diet and exercise are not working.  . If you smoke, find out from your health care provider how to quit. If you do not use tobacco,  please do not start.  . If you choose to drink alcohol, please do not consume more than 2 drinks per day. One drink is considered to be 12 ounces (355 mL) of beer, 5 ounces (148 mL) of wine, or 1.5 ounces (44 mL) of liquor.  . If you are 29-73 years old, ask your health care provider if you should take aspirin to prevent strokes.  . Use sunscreen. Apply sunscreen liberally and repeatedly throughout the day. You should seek shade when your shadow is shorter than you. Protect yourself by wearing long sleeves, pants, a wide-brimmed hat, and sunglasses year round, whenever you are outdoors.  . Once a month, do a whole body skin exam, using a mirror to look at the skin on your back. Tell your health care provider of new moles, moles that have irregular borders, moles that are larger than a pencil eraser, or moles that have changed in shape or color.

## 2016-09-21 NOTE — Progress Notes (Signed)
Subjective:   Debra [name] is a 55 y.o. female who presents for Medicare Annual (Subsequent) preventive examination.  Review of Systems:  N/A Cardiac Risk Factors include: diabetes mellitus;hypertension;obesity (BMI >30kg/m2);smoking/ tobacco exposure     Objective:     Vitals: BP 124/60   Pulse 80   Temp 98 F (36.7 C) (Oral)   Ht 5' 4.25" (1.632 m)   Wt 238 lb 2 oz (108 kg)   BMI 40.56 kg/m   Body mass index is 40.56 kg/m.   Tobacco History  Smoking Status  . Current Every Day Smoker  . Packs/day: 0.25  . Years: 24.00  . Types: Cigarettes  Smokeless Tobacco  . Never Used     Ready to quit: No Counseling given: No   Past Medical History:  Diagnosis Date  . Anxiety   . Arthritis   . Depression   . Diabetes mellitus without complication (HCC)    Type II  . Family history of adverse reaction to anesthesia    Mother - BP drops  . Head injury, closed, with brief LOC (HCC) 2011ish  . Hyperlipidemia   . Hypertension   . Insomnia   . Obesity   . Seasonal allergies   . Tobacco abuse    Past Surgical History:  Procedure Laterality Date  . ABDOMINAL HYSTERECTOMY  2012  . CESAREAN SECTION  1992  . CHOLECYSTECTOMY  2013  . LUMBAR LAMINECTOMY/DECOMPRESSION MICRODISCECTOMY Left 09/10/2015   Procedure: Left L4-5 Foraminotomy/Left L5-S1 Diskectomy;  Surgeon: Hilda LiasErnesto Botero, MD;  Location: [facility];  Service: Neurosurgery;  Laterality: Left;  Left L4-5 Foraminotomy/Left L5-S1 Diskectomy   Family History  Problem Relation Age of Onset  . COPD Mother   . Bipolar disorder Father   . Diabetes Mellitus II Father   . Bipolar disorder Brother   . Diabetes Mellitus II Brother    History  Sexual Activity  . Sexual activity: No    Outpatient Encounter Prescriptions as of 09/21/2016  Medication Sig  . ACCU-CHEK SMARTVIEW test strip   . Alcohol Swabs (ALCOHOL PREP) PADS   . aspirin EC 81 MG tablet Take 81 mg by mouth daily.  . Bitter Melon 10 % POWD by Does not  apply route.  . calcium carbonate (OSCAL) 1500 (600 Ca) MG TABS tablet Take by mouth 2 (two) times daily with a meal.  . cholecalciferol (VITAMIN D) 400 units TABS tablet Take 400 Units by mouth.  . folic acid (FOLVITE) 400 MCG tablet Take 800 mcg by mouth daily.   [name]. glipiZIDE (GLUCOTROL XL) 10 MG 24 hr tablet Take 1 tablet (10 mg total) by mouth daily with breakfast. (Patient taking differently: Take 10 mg by mouth daily with breakfast. )  . insulin glargine (LANTUS) 100 UNIT/ML injection Inject 50 Units into the skin 2 (two) times daily.  [name]. lisinopril (PRINIVIL,ZESTRIL) 10 MG tablet Take 1 tablet (10 mg total) by mouth daily. (Patient taking differently: Take 10 mg by mouth daily. )  . Multiple Vitamins-Minerals (CENTRUM WOMEN) TABS Take 1 tablet by mouth.  [name]. PARoxetine (PAXIL) 40 MG tablet Take 1 tablet (40 mg total) by mouth daily.  . traZODone (DESYREL) 50 MG tablet Take 1 tablet (50 mg total) by mouth at bedtime.  . insulin aspart protamine- aspart (NOVOLOG MIX 70/30) (70-30) 100 UNIT/ML injection    No facility-administered encounter medications on file as of 09/21/2016.     Activities of Daily Living In your present state of health, do you have any difficulty  performing the following activities: 09/21/2016 06/15/2016  Hearing? Y N  Vision? N N  Difficulty concentrating or making decisions? Y N  Walking or climbing stairs? Y N  Dressing or bathing? N N  Doing errands, shopping? N N  Preparing Food and eating ? N -  Using the Toilet? N -  In the past six months, have you accidently leaked urine? N -  Do you have problems with loss of bowel control? N -  Managing your Medications? N -  Managing your Finances? N -  Housekeeping or managing your Housekeeping? N -  Some recent data might be hidden    Patient Care Team: Ellyn Hack, MD as PCP - General (Family Medicine) Raj Janus, MD as Physician Assistant (Internal Medicine)    Assessment:     Exercise Activities and  Dietary recommendations Current Exercise Habits: The patient does not participate in regular exercise at present, Exercise limited by: None identified  Goals    . Increase water intake          Starting 09/21/16, I will continue to drink 8 or more glasses of water.      Fall Risk Fall Risk  09/21/2016 06/15/2016 12/11/2015 08/28/2015 08/20/2015  Falls in the past year? Yes No Yes Yes Yes  Number falls in past yr: 2 or more - 2 or more 2 or more 2 or more  Injury with Fall? No - Yes Yes Yes  Risk Factor Category  - - - High Fall Risk High Fall Risk  Follow up - - Falls evaluation completed Falls evaluation completed Falls evaluation completed   Depression Screen PHQ 2/9 Scores 09/21/2016 06/15/2016 12/11/2015 08/28/2015  PHQ - 2 Score 0 0 0 3     Cognitive Testing MMSE - Mini Mental State Exam 09/21/2016  Orientation to time 5  Orientation to Place 5  Registration 3  Attention/ Calculation 5  Recall 3  Language- name 2 objects 2  Language- repeat 1  Language- follow 3 step command 3  Language- read & follow direction 0  Write a sentence 0  Copy design 0  Total score 27    Immunization History  Administered Date(s) Administered  . Hepatitis B 12/20/2014, 01/29/2015  . Hepatitis B, adult 07/22/2015  . Influenza,inj,Quad PF,36+ Mos 08/28/2015  . Pneumococcal Polysaccharide-23 07/02/2014  . Tdap 07/02/2014   Screening Tests Health Maintenance  Topic Date Due  . Hepatitis C Screening  07/15/1962  . HIV Screening  11/03/1977  . MAMMOGRAM  11/03/2012  . COLONOSCOPY  11/03/2012  . PAP SMEAR  09/21/2026 (Originally 11/04/1983)  . OPHTHALMOLOGY EXAM  10/07/2016  . HEMOGLOBIN A1C  01/07/2017  . FOOT EXAM  07/07/2017  . PNEUMOCOCCAL POLYSACCHARIDE VACCINE (2) 07/03/2019  . TETANUS/TDAP  07/02/2024  . INFLUENZA VACCINE  Completed      Plan:  I have personally reviewed and addressed the Medicare Annual Wellness questionnaire and have noted the following in the patient's chart:   A. Medical and social history B. Use of alcohol, tobacco or illicit drugs  C. Current medications and supplements D. Functional ability and status E.  Nutritional status F.  Physical activity G. Advance directives H. List of other physicians I.  Hospitalizations, surgeries, and ER visits in previous 12 months J.  Vitals K. Screenings such as hearing and vision if needed, cognitive and depression L. Referrals and appointments - none  In addition, I have reviewed and discussed with patient certain preventive protocols, quality metrics, and best practice  recommendations. A written personalized care plan for preventive services as well as general preventive health recommendations were provided to patient.  See attached scanned questionnaire for additional information.   Signed,  Hyacinth Meeker, LPN Nurse Health Advisor

## 2016-09-22 LAB — HEPATITIS C ANTIBODY: HCV Ab: NEGATIVE

## 2016-09-22 LAB — HIV ANTIBODY (ROUTINE TESTING W REFLEX): HIV 1&2 Ab, 4th Generation: NONREACTIVE

## 2016-10-06 DIAGNOSIS — E1165 Type 2 diabetes mellitus with hyperglycemia: Secondary | ICD-10-CM | POA: Diagnosis not present

## 2016-10-06 DIAGNOSIS — Z794 Long term (current) use of insulin: Secondary | ICD-10-CM | POA: Diagnosis not present

## 2016-10-06 DIAGNOSIS — E782 Mixed hyperlipidemia: Secondary | ICD-10-CM | POA: Diagnosis not present

## 2016-10-06 NOTE — Progress Notes (Signed)
Lab work ordered for [facility] for the same date.

## 2016-10-13 DIAGNOSIS — E782 Mixed hyperlipidemia: Secondary | ICD-10-CM | POA: Diagnosis not present

## 2016-10-13 DIAGNOSIS — Z794 Long term (current) use of insulin: Secondary | ICD-10-CM | POA: Diagnosis not present

## 2016-10-13 DIAGNOSIS — F17200 Nicotine dependence, unspecified, uncomplicated: Secondary | ICD-10-CM | POA: Diagnosis not present

## 2016-10-13 DIAGNOSIS — E1165 Type 2 diabetes mellitus with hyperglycemia: Secondary | ICD-10-CM | POA: Diagnosis not present

## 2016-10-21 ENCOUNTER — Emergency Department (HOSPITAL_COMMUNITY): Payer: Managed Care, Other (non HMO)

## 2016-10-21 ENCOUNTER — Emergency Department (HOSPITAL_COMMUNITY)
Admission: EM | Admit: 2016-10-21 | Discharge: 2016-10-21 | Disposition: A | Discharge status: Home / Self Care | Payer: Managed Care, Other (non HMO) | Attending: Emergency Medicine | Admitting: Emergency Medicine

## 2016-10-21 ENCOUNTER — Encounter (HOSPITAL_COMMUNITY): Payer: Self-pay | Admitting: Emergency Medicine

## 2016-10-21 DIAGNOSIS — R29810 Facial weakness: Secondary | ICD-10-CM

## 2016-10-21 DIAGNOSIS — M6248 Contracture of muscle, other site: Secondary | ICD-10-CM | POA: Diagnosis not present

## 2016-10-21 DIAGNOSIS — R791 Abnormal coagulation profile: Secondary | ICD-10-CM | POA: Insufficient documentation

## 2016-10-21 DIAGNOSIS — I679 Cerebrovascular disease, unspecified: Secondary | ICD-10-CM

## 2016-10-21 DIAGNOSIS — Z7984 Long term (current) use of oral hypoglycemic drugs: Secondary | ICD-10-CM | POA: Diagnosis not present

## 2016-10-21 DIAGNOSIS — G8191 Hemiplegia, unspecified affecting right dominant side: Secondary | ICD-10-CM | POA: Diagnosis not present

## 2016-10-21 DIAGNOSIS — Z794 Long term (current) use of insulin: Secondary | ICD-10-CM | POA: Diagnosis not present

## 2016-10-21 DIAGNOSIS — I10 Essential (primary) hypertension: Secondary | ICD-10-CM | POA: Diagnosis not present

## 2016-10-21 DIAGNOSIS — R51 Headache: Secondary | ICD-10-CM | POA: Diagnosis not present

## 2016-10-21 DIAGNOSIS — E119 Type 2 diabetes mellitus without complications: Secondary | ICD-10-CM | POA: Diagnosis not present

## 2016-10-21 DIAGNOSIS — Z79899 Other long term (current) drug therapy: Secondary | ICD-10-CM | POA: Diagnosis not present

## 2016-10-21 DIAGNOSIS — Z7982 Long term (current) use of aspirin: Secondary | ICD-10-CM | POA: Insufficient documentation

## 2016-10-21 DIAGNOSIS — R29818 Other symptoms and signs involving the nervous system: Secondary | ICD-10-CM | POA: Diagnosis not present

## 2016-10-21 DIAGNOSIS — F17210 Nicotine dependence, cigarettes, uncomplicated: Secondary | ICD-10-CM | POA: Insufficient documentation

## 2016-10-21 LAB — I-STAT CHEM 8, ED
BUN: 11 mg/dL (ref 6–20)
CALCIUM ION: 1.11 mmol/L — AB (ref 1.15–1.40)
Chloride: 104 mmol/L (ref 101–111)
Creatinine, Ser: 0.5 mg/dL (ref 0.44–1.00)
GLUCOSE: 292 mg/dL — AB (ref 65–99)
HCT: 43 % (ref 36.0–46.0)
Hemoglobin: 14.6 g/dL (ref 12.0–15.0)
Potassium: 4.2 mmol/L (ref 3.5–5.1)
SODIUM: 137 mmol/L (ref 135–145)
TCO2: 22 mmol/L (ref 0–100)

## 2016-10-21 LAB — PROTIME-INR
INR: 0.93
PROTHROMBIN TIME: 12.4 s (ref 11.4–15.2)

## 2016-10-21 LAB — DIFFERENTIAL
Basophils Absolute: 0 10*3/uL (ref 0.0–0.1)
Basophils Relative: 1 %
EOS PCT: 6 %
Eosinophils Absolute: 0.5 10*3/uL (ref 0.0–0.7)
LYMPHS PCT: 35 %
Lymphs Abs: 2.9 10*3/uL (ref 0.7–4.0)
MONO ABS: 0.6 10*3/uL (ref 0.1–1.0)
MONOS PCT: 8 %
NEUTROS ABS: 4.3 10*3/uL (ref 1.7–7.7)
Neutrophils Relative %: 52 %

## 2016-10-21 LAB — CBC
HEMATOCRIT: 42.2 % (ref 36.0–46.0)
Hemoglobin: 14.9 g/dL (ref 12.0–15.0)
MCH: 31.9 pg (ref 26.0–34.0)
MCHC: 35.3 g/dL (ref 30.0–36.0)
MCV: 90.4 fL (ref 78.0–100.0)
PLATELETS: 246 10*3/uL (ref 150–400)
RBC: 4.67 MIL/uL (ref 3.87–5.11)
RDW: 12.7 % (ref 11.5–15.5)
WBC: 8.3 10*3/uL (ref 4.0–10.5)

## 2016-10-21 LAB — ETHANOL

## 2016-10-21 LAB — COMPREHENSIVE METABOLIC PANEL
ALK PHOS: 74 U/L (ref 38–126)
ALT: 19 U/L (ref 14–54)
ANION GAP: 9 (ref 5–15)
AST: 18 U/L (ref 15–41)
Albumin: 3.7 g/dL (ref 3.5–5.0)
BILIRUBIN TOTAL: 0.6 mg/dL (ref 0.3–1.2)
BUN: 9 mg/dL (ref 6–20)
CALCIUM: 9.3 mg/dL (ref 8.9–10.3)
CO2: 21 mmol/L — ABNORMAL LOW (ref 22–32)
CREATININE: 0.62 mg/dL (ref 0.44–1.00)
Chloride: 105 mmol/L (ref 101–111)
Glucose, Bld: 292 mg/dL — ABNORMAL HIGH (ref 65–99)
Potassium: 4.1 mmol/L (ref 3.5–5.1)
SODIUM: 135 mmol/L (ref 135–145)
TOTAL PROTEIN: 5.8 g/dL — AB (ref 6.5–8.1)

## 2016-10-21 LAB — APTT: aPTT: 26 seconds (ref 24–36)

## 2016-10-21 LAB — I-STAT CG4 LACTIC ACID, ED: LACTIC ACID, VENOUS: 1.36 mmol/L (ref 0.5–1.9)

## 2016-10-21 NOTE — Discharge Instructions (Signed)
Follow-up with outpatient therapist. [facility] in [place] accepts walk-in appointments for new patients from 9 AM to 4 PM. They're located at [address], [place], [place] [address]  [phone]

## 2016-10-21 NOTE — ED Provider Notes (Signed)
[facility]-EMERGENCY DEPT Provider Note   CSN: [id number] Arrival date & time: 10/21/16  1323     History   Chief Complaint Chief Complaint  Patient presents with  . Facial Droop    HPI Harl BowieKim A Neuser is a 55 y.o. female.  HPI Patient presents with right-sided facial asymmetry starting at 1245. Initially had some right sided weakness which has resolved. Denies any pain including headache, neck pain, chest pain or abdominal pain. No nausea or vomiting. He ended as code stroke. Evaluated by neurology and felt to be psychomotor. Past Medical History:  Diagnosis Date  . Anxiety   . Arthritis   . Depression   . Diabetes mellitus without complication (HCC)    Type II  . Family history of adverse reaction to anesthesia    Mother - BP drops  . Head injury, closed, with brief LOC (HCC) 2011ish  . Hyperlipidemia   . Hypertension   . Insomnia   . Obesity   . Seasonal allergies   . Tobacco abuse     Patient Active Problem List   Diagnosis Date Noted  . Insomnia 06/15/2016  . Oral candidiasis 12/11/2015  . Urinary tract infection symptoms 12/11/2015  . Need for immunization against influenza 08/28/2015  . Acute low back pain with radicular symptoms, duration less than 6 weeks 08/28/2015  . Need for vaccination 08/21/2015  . Lumbar disc disease with radiculopathy 08/20/2015  . Lumbar herniated disc 08/20/2015  . Uncontrolled diabetes mellitus (HCC) 08/20/2015  . Left-sided low back pain with sciatica 06/03/2015  . Abdominal gas pain 06/03/2015  . Abdominal pain, generalized 06/03/2015  . Bipolar disorder (HCC) 06/03/2015  . Insomnia, persistent 06/03/2015  . BP (high blood pressure) 06/03/2015  . Type 2 diabetes mellitus treated with insulin (HCC) 06/03/2015  . Leg swelling 06/03/2015  . Extreme obesity (HCC) 06/03/2015  . Compulsive tobacco user syndrome 06/03/2015  . Vaginitis due to Candida 06/03/2015  . Phlebectasia 06/03/2015    Past Surgical History:  Procedure  Laterality Date  . ABDOMINAL HYSTERECTOMY  2012  . CESAREAN SECTION  1992  . CHOLECYSTECTOMY  2013  . LUMBAR LAMINECTOMY/DECOMPRESSION MICRODISCECTOMY Left 09/10/2015   Procedure: Left L4-5 Foraminotomy/Left L5-S1 Diskectomy;  Surgeon: Hilda LiasErnesto Botero, MD;  Location: [facility];  Service: Neurosurgery;  Laterality: Left;  Left L4-5 Foraminotomy/Left L5-S1 Diskectomy    OB History    Gravida Para Term Preterm AB Living   1 1           SAB TAB Ectopic Multiple Live Births                   Home Medications    Prior to Admission medications   Medication Sig Start Date End Date Taking? Authorizing Provider  ACCU-CHEK SMARTVIEW test strip  08/18/15  Yes Historical Provider, MD  Alcohol Swabs (ALCOHOL PREP) PADS  08/18/15  Yes Historical Provider, MD  aspirin EC 81 MG tablet Take 81 mg by mouth daily.   Yes Historical Provider, MD  Bitter Melon 10 % POWD by Does not apply route.   Yes Historical Provider, MD  calcium carbonate (OSCAL) 1500 (600 Ca) MG TABS tablet Take by mouth 2 (two) times daily with a meal.   Yes Historical Provider, MD  cholecalciferol (VITAMIN D) 400 units TABS tablet Take 400 Units by mouth.   Yes Historical Provider, MD  folic acid (FOLVITE) 400 MCG tablet Take 800 mcg by mouth daily.    Yes Historical Provider, MD  glipiZIDE (GLUCOTROL XL)  10 MG 24 hr tablet Take 1 tablet (10 mg total) by mouth daily with breakfast. Patient taking differently: Take 10 mg by mouth daily with breakfast.  06/27/15  Yes Ellyn HackSyed Asad A Shah, MD  insulin glargine (LANTUS) 100 UNIT/ML injection Inject 60 Units into the skin 2 (two) times daily.    Yes Historical Provider, MD  lisinopril (PRINIVIL,ZESTRIL) 10 MG tablet Take 1 tablet (10 mg total) by mouth daily. Patient taking differently: Take 10 mg by mouth daily.  06/15/16  Yes Ellyn HackSyed Asad A Shah, MD  Multiple Vitamins-Minerals (CENTRUM WOMEN) TABS Take 1 tablet by mouth.   Yes Historical Provider, MD  PARoxetine (PAXIL) 40 MG tablet Take 1  tablet (40 mg total) by mouth daily. 06/15/16  Yes Ellyn HackSyed Asad A Shah, MD  Red Yeast Rice 600 MG TABS Take 600 mg by mouth daily.   Yes Historical Provider, MD  traZODone (DESYREL) 50 MG tablet Take 1 tablet (50 mg total) by mouth at bedtime. 06/15/16  Yes Ellyn HackSyed Asad A Shah, MD  insulin aspart protamine- aspart (NOVOLOG MIX 70/30) (70-30) 100 UNIT/ML injection  05/20/16   Historical Provider, MD    Family History Family History  Problem Relation Age of Onset  . COPD Mother   . Bipolar disorder Father   . Diabetes Mellitus II Father   . Bipolar disorder Brother   . Diabetes Mellitus II Brother     Social History Social History  Substance Use Topics  . Smoking status: Current Every Day Smoker    Packs/day: 0.25    Years: 24.00    Types: Cigarettes  . Smokeless tobacco: Never Used  . Alcohol use No     Comment: occasional- rare     Allergies   Metformin and related; Benadryl [diphenhydramine hcl]; Canagliflozin; Cholesterol; Depakote er [divalproex sodium er]; Dilaudid [hydromorphone hcl]; Diphenhydramine-zinc acetate; Haloperidol; Hydromorphone; Neosporin [neomycin-bacitracin zn-polymyx]; Singulair [montelukast sodium]; Sitagliptin; Statins; Sulfa antibiotics; and Victoza [liraglutide]   Review of Systems Review of Systems  Constitutional: Negative for chills and fever.  Eyes: Negative for visual disturbance.  Respiratory: Negative for shortness of breath.   Cardiovascular: Negative for chest pain.  Gastrointestinal: Negative for abdominal pain, diarrhea, nausea and vomiting.  Musculoskeletal: Negative for neck pain.  Skin: Negative for rash and wound.  Neurological: Positive for weakness. Negative for dizziness, syncope, light-headedness, numbness and headaches.  All other systems reviewed and are negative.    Physical Exam Updated Vital Signs BP 145/76 (BP Location: Right Arm)   Pulse 60   Temp 98.4 F (36.9 C) (Oral)   Resp 18   Ht 5\' 4"  (1.626 m)   Wt 222 lb 7.1 oz  (100.9 kg)   SpO2 95%   BMI 38.18 kg/m   Physical Exam  Constitutional: She is oriented to person, place, and time. She appears well-developed and well-nourished. No distress.  HENT:  Head: Normocephalic and atraumatic.  Mouth/Throat: Oropharynx is clear and moist.  Patient is holding right lower lip to the right with respect to the top lip.  Eyes: EOM are normal. Pupils are equal, round, and reactive to light.  Neck: Normal range of motion. Neck supple.  Cardiovascular: Normal rate and regular rhythm.   Pulmonary/Chest: Effort normal and breath sounds normal.  Abdominal: Soft. Bowel sounds are normal. There is no tenderness. There is no rebound and no guarding.  Musculoskeletal: Normal range of motion. She exhibits no edema or tenderness.  Neurological: She is alert and oriented to person, place, and time.  5/5 motor  in all extremities. Sensation is fully intact. No definite facial weakness. Inconsistent exam at times. Patient speaks in a clear voice.  Skin: Skin is warm and dry. Capillary refill takes less than 2 seconds. No rash noted. No erythema.  Nursing note and vitals reviewed.    ED Treatments / Results  Labs (all labs ordered are listed, but only abnormal results are displayed) Labs Reviewed  COMPREHENSIVE METABOLIC PANEL - Abnormal; Notable for the following:       Result Value   CO2 21 (*)    Glucose, Bld 292 (*)    Total Protein 5.8 (*)    All other components within normal limits  I-STAT CHEM 8, ED - Abnormal; Notable for the following:    Glucose, Bld 292 (*)    Calcium, Ion 1.11 (*)    All other components within normal limits  ETHANOL  PROTIME-INR  APTT  CBC  DIFFERENTIAL  I-STAT TROPOININ, ED  I-STAT CG4 LACTIC ACID, ED    EKG  EKG Interpretation None       Radiology Ct Head Code Stroke W/o Cm  Result Date: 10/21/2016 CLINICAL DATA:  Code stroke.  Right facial droop EXAM: CT HEAD WITHOUT CONTRAST TECHNIQUE: Contiguous axial images were  obtained from the base of the skull through the vertex without intravenous contrast. COMPARISON:  Head CT 09/07/2016 FINDINGS: Brain: There is no intraparenchymal hemorrhage, extra-axial collection or mass lesion. Old lacunar infarct in the left basal ganglia is unchanged. No CT evidence of acute cortical infarct. Gray-white differentiation, the basal ganglia and insular cortices are maintained. There is no age advanced or lobar predominant atrophy. Vascular: No hyperdense vessel or unexpected calcification. Skull: Normal. Negative for fracture or focal lesion. Sinuses/Orbits: The visualized portions of the paranasal sinuses and mastoid air cells are free of fluid. No advanced mucosal thickening. The visualized orbits are normal. Other: None ASPECTS (Alberta Stroke Program Early CT Score) - Ganglionic level infarction (caudate, lentiform nuclei, internal capsule, insula, M1-M3 cortex): 7 - Supraganglionic infarction (M4-M6 cortex): 3 Total score (0-10 with 10 being normal): 10 IMPRESSION: 1. No acute intracranial abnormality. 2. ASPECTS is 10. 3. Unchanged appearance of chronic left basal ganglia lacunar infarct. These results were called by telephone at the time of interpretation on 10/21/2016 at 1:38 pm to Dr. Rhona Leavens , who verbally acknowledged these results. Electronically Signed   By: Deatra Robinson M.D.   On: 10/21/2016 13:38    Procedures Procedures (including critical care time)  Medications Ordered in ED Medications - No data to display   Initial Impression / Assessment and Plan / ED Course  I have reviewed the triage vital signs and the nursing notes.  Pertinent labs & imaging results that were available during my care of the patient were reviewed by me and considered in my medical decision making (see chart for details).  Clinical Course   Patient evaluated by neurology and felt that symptoms are psychomotor. Code stroke was called off. Recommend outpatient psychiatric treatment.  Emergency department workup without acute findings. Discussed with patient and family results of testing and need for outpatient follow-up.  Final Clinical Impressions(s) / ED Diagnoses   Final diagnoses:  Facial contracture    New Prescriptions Discharge Medication List as of 10/21/2016  3:36 PM       Loren Racer, MD 10/22/16 1526

## 2016-10-21 NOTE — Consult Note (Signed)
Requesting Physician: Dr. Ranae Palms    Chief Complaint: Code stroke  History obtained from:  Patient    HPI:                                                                                                                                         Debra [name] is an 55 y.o. female presenting to the hospital as a code stroke from [place]. Per EMS patient was at a friend's house when she was suddenly noted to have a right facial droop and right-sided weakness. EMS was called and patient was brought as a code stroke to the ED. On arrival patient was complaining of a left-sided headache. Patient was brought to the CT which showed no acute intercranial bleed or acute ischemia. Patient had no other complaints other than headache and was able to follow commands. During the exam patient was stating that she did not feel well however  she were to go home.  Date last known well: Date: 10/21/2016 Time last known well: Time: 12:45 tPA Given: No: Minimal symptoms   Past Medical History:  Diagnosis Date  . Anxiety   . Arthritis   . Depression   . Diabetes mellitus without complication (HCC)    Type II  . Family history of adverse reaction to anesthesia    Mother - BP drops  . Head injury, closed, with brief LOC (HCC) 2011ish  . Hyperlipidemia   . Hypertension   . Insomnia   . Obesity   . Seasonal allergies   . Tobacco abuse     Past Surgical History:  Procedure Laterality Date  . ABDOMINAL HYSTERECTOMY  2012  . CESAREAN SECTION  1992  . CHOLECYSTECTOMY  2013  . LUMBAR LAMINECTOMY/DECOMPRESSION MICRODISCECTOMY Left 09/10/2015   Procedure: Left L4-5 Foraminotomy/Left L5-S1 Diskectomy;  Surgeon: Hilda Lias, MD;  Location: [facility];  Service: Neurosurgery;  Laterality: Left;  Left L4-5 Foraminotomy/Left L5-S1 Diskectomy    Family History  Problem Relation Age of Onset  . COPD Mother   . Bipolar disorder Father   . Diabetes Mellitus II Father   . Bipolar disorder Brother   . Diabetes  Mellitus II Brother    Social History:  reports that she has been smoking Cigarettes.  She has a 6.00 pack-year smoking history. She has never used smokeless tobacco. She reports that she does not drink alcohol or use drugs.  Allergies:  Allergies  Allergen Reactions  . Metformin And Related Diarrhea  . Benadryl [Diphenhydramine Hcl] Swelling    blistering  . Canagliflozin Other (See Comments)    Low bp  . Cholesterol Other (See Comments)    meds cause her to pass out  . Depakote Er [Divalproex Sodium Er] Other (See Comments)    seizures  . Dilaudid [Hydromorphone Hcl] Other (See Comments)    seizures  . Diphenhydramine-Zinc Acetate Other (See Comments)  . Haloperidol Other (  See Comments)  . Hydromorphone Other (See Comments)  . Neosporin [Neomycin-Bacitracin Zn-Polymyx] Swelling    blistering  . Singulair [Montelukast Sodium] Other (See Comments)    Asthma attacks  . Sitagliptin Other (See Comments)  . Statins Other (See Comments)  . Sulfa Antibiotics Hives  . Victoza [Liraglutide] Other (See Comments)    pancreatitis    Medications:                                                                                                                           No current facility-administered medications for this encounter.    Current Outpatient Prescriptions  Medication Sig Dispense Refill  . ACCU-CHEK SMARTVIEW test strip     . Alcohol Swabs (ALCOHOL PREP) PADS     . aspirin EC 81 MG tablet Take 81 mg by mouth daily.    . Bitter Melon 10 % POWD by Does not apply route.    . calcium carbonate (OSCAL) 1500 (600 Ca) MG TABS tablet Take by mouth 2 (two) times daily with a meal.    . cholecalciferol (VITAMIN D) 400 units TABS tablet Take 400 Units by mouth.    . folic acid (FOLVITE) 400 MCG tablet Take 800 mcg by mouth daily.     [name] glipiZIDE (GLUCOTROL XL) 10 MG 24 hr tablet Take 1 tablet (10 mg total) by mouth daily with breakfast. (Patient taking differently: Take 10 mg by mouth  daily with breakfast. ) 90 tablet 0  . insulin glargine (LANTUS) 100 UNIT/ML injection Inject 60 Units into the skin 2 (two) times daily.     [name] lisinopril (PRINIVIL,ZESTRIL) 10 MG tablet Take 1 tablet (10 mg total) by mouth daily. (Patient taking differently: Take 10 mg by mouth daily. ) 90 tablet 1  . Multiple Vitamins-Minerals (CENTRUM WOMEN) TABS Take 1 tablet by mouth.    [name] PARoxetine (PAXIL) 40 MG tablet Take 1 tablet (40 mg total) by mouth daily. 90 tablet 1  . Red Yeast Rice 600 MG TABS Take 600 mg by mouth daily.    . traZODone (DESYREL) 50 MG tablet Take 1 tablet (50 mg total) by mouth at bedtime. 90 tablet 1  . insulin aspart protamine- aspart (NOVOLOG MIX 70/30) (70-30) 100 UNIT/ML injection        ROS:  History obtained from the patient  General ROS: negative for - chills, fatigue, fever, night sweats, weight gain or weight loss Psychological ROS: negative for - behavioral disorder, hallucinations, memory difficulties, mood swings or suicidal ideation Ophthalmic ROS: negative for - blurry vision, double vision, eye pain or loss of vision ENT ROS: negative for - epistaxis, nasal discharge, oral lesions, sore throat, tinnitus or vertigo Allergy and Immunology ROS: negative for - hives or itchy/watery eyes Hematological and Lymphatic ROS: negative for - bleeding problems, bruising or swollen lymph nodes Endocrine ROS: negative for - galactorrhea, hair pattern changes, polydipsia/polyuria or temperature intolerance Respiratory ROS: negative for - cough, hemoptysis, shortness of breath or wheezing Cardiovascular ROS: negative for - chest pain, dyspnea on exertion, edema or irregular heartbeat Gastrointestinal ROS: negative for - abdominal pain, diarrhea, hematemesis, nausea/vomiting or stool incontinence Genito-Urinary ROS: negative for - dysuria,  hematuria, incontinence or urinary frequency/urgency Musculoskeletal ROS: negative for - joint swelling or muscular weakness Neurological ROS: as noted in HPI Dermatological ROS: negative for rash and skin lesion changes  Neurologic Examination:                                                                                                      Blood pressure 184/93, pulse 70, temperature 98.4 F (36.9 C), temperature source Oral, resp. rate 16, height 5\' 4"  (1.626 m), weight 100.9 kg (222 lb 7.1 oz), SpO2 97 %.  HEENT-  Normocephalic, no lesions, without obvious abnormality.  Normal external eye and conjunctiva.  Normal TM's bilaterally.  Normal auditory canals and external ears. Normal external nose, mucus membranes and septum.  Normal pharynx. Cardiovascular- S1, S2 normal, pulses palpable throughout   Lungs- chest clear, no wheezing, rales, normal symmetric air entry Abdomen- normal findings: bowel sounds normal Extremities- no edema Lymph-no adenopathy palpable Musculoskeletal-no joint tenderness, deformity or swelling Skin-warm and dry, no hyperpigmentation, vitiligo, or suspicious lesions  Neurological Examination Mental Status: Alert, oriented, thought content appropriate.  Speech fluent without evidence of aphasia.  Able to follow 3 step commands without difficulty. Cranial Nerves: II: Discs flat bilaterally; Visual fields grossly normal,  III,IV, VI: ptosis not present, extra-ocular motions intact bilaterally, pupils equal, round, reactive to light and accommodation V,VII: smile symmetric--when she was not asked to smile but just talking. However when asked to smile she would pull the upper portion of her low lip on the left to the left and at times pulled the lower lip to the right. facial light touch sensation normal bilaterally VIII: hearing normal bilaterally IX,X: uvula rises symmetrically XI: bilateral shoulder shrug XII: midline tongue extension Motor: Right : Upper  extremity   5/5    Left:     Upper extremity   5/5  Lower extremity   5/5     Lower extremity   5/5 --Patient has significant give way weakness on the right upper and lower extremity however when not paying attention she had full strength Tone and bulk:normal tone throughout; no atrophy noted Sensory: Pinprick and light touch intact throughout, bilaterally Deep Tendon Reflexes: 2+ and symmetric throughout Plantars: Right:  downgoing   Left: downgoing Cerebellar: normal finger-to-nose,normal heel-to-shin test Gait: Not tested       Lab Results: Basic Metabolic Panel:  Recent Labs Lab 10/21/16 1321 10/21/16 1327  NA 135 137  K 4.1 4.2  CL 105 104  CO2 21*  --   GLUCOSE 292* 292*  BUN 9 11  CREATININE 0.62 0.50  CALCIUM 9.3  --     Liver Function Tests:  Recent Labs Lab 10/21/16 1321  AST 18  ALT 19  ALKPHOS 74  BILITOT 0.6  PROT 5.8*  ALBUMIN 3.7   No results for input(s): LIPASE, AMYLASE in the last 168 hours. No results for input(s): AMMONIA in the last 168 hours.  CBC:  Recent Labs Lab 10/21/16 1321 10/21/16 1327  WBC 8.3  --   NEUTROABS 4.3  --   HGB 14.9 14.6  HCT 42.2 43.0  MCV 90.4  --   PLT 246  --     Cardiac Enzymes: No results for input(s): CKTOTAL, CKMB, CKMBINDEX, TROPONINI in the last 168 hours.  Lipid Panel: No results for input(s): CHOL, TRIG, HDL, CHOLHDL, VLDL, LDLCALC in the last 168 hours.  CBG: No results for input(s): GLUCAP in the last 168 hours.  Microbiology: Results for orders placed or performed in visit on 12/11/15  Urine Culture     Status: None   Collection Time: 12/12/15  5:11 PM  Result Value Ref Range Status   Urine Culture, Routine Final report  Final   Urine Culture result 1 No growth  Final    Coagulation Studies:  Recent Labs  10/21/16 1321  LABPROT 12.4  INR 0.93    Imaging: Ct Head Code Stroke W/o Cm  Result Date: 10/21/2016 CLINICAL DATA:  Code stroke.  Right facial droop EXAM: CT HEAD  WITHOUT CONTRAST TECHNIQUE: Contiguous axial images were obtained from the base of the skull through the vertex without intravenous contrast. COMPARISON:  Head CT 09/07/2016 FINDINGS: Brain: There is no intraparenchymal hemorrhage, extra-axial collection or mass lesion. Old lacunar infarct in the left basal ganglia is unchanged. No CT evidence of acute cortical infarct. Gray-white differentiation, the basal ganglia and insular cortices are maintained. There is no age advanced or lobar predominant atrophy. Vascular: No hyperdense vessel or unexpected calcification. Skull: Normal. Negative for fracture or focal lesion. Sinuses/Orbits: The visualized portions of the paranasal sinuses and mastoid air cells are free of fluid. No advanced mucosal thickening. The visualized orbits are normal. Other: None ASPECTS (Alberta Stroke Program Early CT Score) - Ganglionic level infarction (caudate, lentiform nuclei, internal capsule, insula, M1-M3 cortex): 7 - Supraganglionic infarction (M4-M6 cortex): 3 Total score (0-10 with 10 being normal): 10 IMPRESSION: 1. No acute intracranial abnormality. 2. ASPECTS is 10. 3. Unchanged appearance of chronic left basal ganglia lacunar infarct. These results were called by telephone at the time of interpretation on 10/21/2016 at 1:38 pm to Dr. Rhona LeavensIMOTHY OSTER , who verbally acknowledged these results. Electronically Signed   By: Deatra RobinsonKevin  Herman M.D.   On: 10/21/2016 13:38       Assessment and plan discussed with with attending physician and they are in agreement.    Felicie MornDavid Smith PA-C [facility] [phone]  10/21/2016, 2:39 PM   Assessment: 55 y.o. female 's ending to the ED as code stroke with presumed right-sided weakness and facial droop. On exam patient is pulling her lip to the side and when speaking she has no facial droop and intact facial symmetry. Patient's exam has multiple functional components including  give way weakness on the right arm and leg. CT scan of  brain was obtained and did not show any intracranial hemorrhage or stroke. At this point code stroke will be canceled. No further diagnostic tests recommended by stroke/neurology group.  Stroke Risk Factors - hyperlipidemia and hypertension

## 2016-10-21 NOTE — ED Notes (Signed)
Patient undressed, in gown, on monitor, continuous pulse oximetry and blood pressure cuff 

## 2016-10-21 NOTE — ED Notes (Signed)
Patient taken off of bedpan with no success; patient now resting on stretcher with eyes closed; no needs

## 2016-10-21 NOTE — ED Notes (Signed)
Patient has returned from CT

## 2016-10-21 NOTE — ED Triage Notes (Signed)
Pt in from home, brought via [facility] for R side facial droop and R side weakness. Per EMS, LKN 1245, family witnessed facial droop. Pt arrives a&ox3, asking about her dogs at home, speech is clear but some confusion. CBG 320, 160/110

## 2016-10-21 NOTE — ED Notes (Signed)
Patient placed on bedpan to attempt an urine specimen

## 2016-10-22 ENCOUNTER — Encounter: Payer: Self-pay | Admitting: Family Medicine

## 2016-10-22 ENCOUNTER — Ambulatory Visit (INDEPENDENT_AMBULATORY_CARE_PROVIDER_SITE_OTHER): Payer: Managed Care, Other (non HMO) | Admitting: Family Medicine

## 2016-10-22 VITALS — BP 150/80 | HR 98 | Temp 98.1°F | Ht 64.0 in | Wt 240.0 lb

## 2016-10-22 DIAGNOSIS — F3178 Bipolar disorder, in full remission, most recent episode mixed: Secondary | ICD-10-CM | POA: Diagnosis not present

## 2016-10-22 DIAGNOSIS — M6248 Contracture of muscle, other site: Secondary | ICD-10-CM | POA: Diagnosis not present

## 2016-10-22 DIAGNOSIS — I10 Essential (primary) hypertension: Secondary | ICD-10-CM

## 2016-10-22 LAB — I-STAT TROPONIN, ED: TROPONIN I, POC: 0.01 ng/mL (ref 0.00–0.08)

## 2016-10-22 MED ORDER — AMLODIPINE BESYLATE 5 MG PO TABS: 5.0000 mg | ORAL_TABLET | Freq: Every day | ORAL | 0 refills | Status: DC

## 2016-10-22 MED ORDER — LISINOPRIL 20 MG PO TABS: 20.0000 mg | ORAL_TABLET | Freq: Every day | ORAL | 0 refills | Status: DC

## 2016-10-22 NOTE — Progress Notes (Signed)
Name: Debra [name]   MRN: [id number]    DOB: January 04, 1961   Date:10/22/2016       Progress Note  Subjective  Chief Complaint  Chief Complaint  Patient presents with  . Transient Ischemic Attack    has previously had a TIA & went to ER yesterday with blood pressure 230/190.   [name]. Extremity Weakness    right hand weakness with tingling at times is numb    HPI  Pt. Presents for ER follow up, she was seen for right sided facial droop, happened suddenly while she was watching TV yesterday afternoon, she was evaluated by EMS and [facility]. COde Stroke was activated and pt. Had a normal CT Scan, lab work came back normal, and she was ruled out for a stroke. ER physician discussed with Neurologist and recommended follow up with Psychiatry. No  Neurological interventions performed. Her droop stabilized and resolved spontaneously. She also registered a very high blood pressure en route to the ER 231/190 mmHg in ambulance. In the hospital, her blood pressure was recorded as 145/75 mmHg, she reports blood pressure has been running higher than usual, now taking higher dose of Lisinopril 20 mg daily.    Past Medical History:  Diagnosis Date  . Anxiety   . Arthritis   . Depression   . Diabetes mellitus without complication (HCC)    Type II  . Family history of adverse reaction to anesthesia    Mother - BP drops  . Head injury, closed, with brief LOC (HCC) 2011ish  . Hyperlipidemia   . Hypertension   . Insomnia   . Obesity   . Seasonal allergies   . Tobacco abuse     Past Surgical History:  Procedure Laterality Date  . ABDOMINAL HYSTERECTOMY  2012  . CESAREAN SECTION  1992  . CHOLECYSTECTOMY  2013  . LUMBAR LAMINECTOMY/DECOMPRESSION MICRODISCECTOMY Left 09/10/2015   Procedure: Left L4-5 Foraminotomy/Left L5-S1 Diskectomy;  Surgeon: Hilda LiasErnesto Botero, MD;  Location: [facility];  Service: Neurosurgery;  Laterality: Left;  Left L4-5 Foraminotomy/Left L5-S1 Diskectomy    Family History   Problem Relation Age of Onset  . COPD Mother   . Bipolar disorder Father   . Diabetes Mellitus II Father   . Bipolar disorder Brother   . Diabetes Mellitus II Brother     Social History   Social History  . Marital status: Married    Spouse name: N/A  . Number of children: N/A  . Years of education: N/A   Occupational History  . Not on file.   Social History Main Topics  . Smoking status: Current Every Day Smoker    Packs/day: 0.25    Years: 24.00    Types: Cigarettes  . Smokeless tobacco: Never Used  . Alcohol use No     Comment: occasional- rare  . Drug use: No  . Sexual activity: No   Other Topics Concern  . Not on file   Social History Narrative  . No narrative on file     Current Outpatient Prescriptions:  .  ACCU-CHEK SMARTVIEW test strip, , Disp: , Rfl:  .  Alcohol Swabs (ALCOHOL PREP) PADS, , Disp: , Rfl:  .  aspirin EC 81 MG tablet, Take 81 mg by mouth daily., Disp: , Rfl:  .  Bitter Melon 10 % POWD, by Does not apply route., Disp: , Rfl:  .  calcium carbonate (OSCAL) 1500 (600 Ca) MG TABS tablet, Take by mouth 2 (two) times daily with  a meal., Disp: , Rfl:  .  cholecalciferol (VITAMIN D) 400 units TABS tablet, Take 400 Units by mouth., Disp: , Rfl:  .  folic acid (FOLVITE) 400 MCG tablet, Take 800 mcg by mouth daily. , Disp: , Rfl:  .  glipiZIDE (GLUCOTROL XL) 10 MG 24 hr tablet, Take 1 tablet (10 mg total) by mouth daily with breakfast. (Patient taking differently: Take 10 mg by mouth daily with breakfast. ), Disp: 90 tablet, Rfl: 0 .  insulin glargine (LANTUS) 100 UNIT/ML injection, Inject 60 Units into the skin 2 (two) times daily. , Disp: , Rfl:  .  lisinopril (PRINIVIL,ZESTRIL) 10 MG tablet, Take 1 tablet (10 mg total) by mouth daily. (Patient taking differently: Take 10 mg by mouth daily. ), Disp: 90 tablet, Rfl: 1 .  Multiple Vitamins-Minerals (CENTRUM WOMEN) TABS, Take 1 tablet by mouth., Disp: , Rfl:  .  PARoxetine (PAXIL) 40 MG tablet, Take 1  tablet (40 mg total) by mouth daily., Disp: 90 tablet, Rfl: 1 .  Red Yeast Rice 600 MG TABS, Take 600 mg by mouth daily., Disp: , Rfl:  .  traZODone (DESYREL) 50 MG tablet, Take 1 tablet (50 mg total) by mouth at bedtime., Disp: 90 tablet, Rfl: 1 .  insulin aspart protamine- aspart (NOVOLOG MIX 70/30) (70-30) 100 UNIT/ML injection, , Disp: , Rfl:   Allergies  Allergen Reactions  . Metformin And Related Diarrhea  . Benadryl [Diphenhydramine Hcl] Swelling    blistering  . Canagliflozin Other (See Comments)    Low bp  . Cholesterol Other (See Comments)    meds cause her to pass out  . Depakote Er [Divalproex Sodium Er] Other (See Comments)    seizures  . Dilaudid [Hydromorphone Hcl] Other (See Comments)    seizures  . Diphenhydramine-Zinc Acetate Other (See Comments)  . Haloperidol Other (See Comments)  . Hydromorphone Other (See Comments)  . Neosporin [Neomycin-Bacitracin Zn-Polymyx] Swelling    blistering  . Singulair [Montelukast Sodium] Other (See Comments)    Asthma attacks  . Sitagliptin Other (See Comments)  . Statins Other (See Comments)  . Sulfa Antibiotics Hives  . Victoza [Liraglutide] Other (See Comments)    pancreatitis     Review of Systems  Constitutional: Negative for chills, fever and malaise/fatigue.  Eyes: Negative for blurred vision.  Respiratory: Negative for cough.   Cardiovascular: Negative for chest pain.  Neurological: Negative for dizziness, tingling, sensory change and headaches.  Psychiatric/Behavioral: Positive for depression. The patient is nervous/anxious.     Please see history of present illness for complete review of systems  Objective  Vitals:   10/22/16 1543  BP: (!) 150/80  Pulse: 98  Temp: 98.1 F (36.7 C)  SpO2: 95%  Weight: 240 lb (108.9 kg)  Height: 5\' 4"  (1.626 m)    Physical Exam  Constitutional: She is oriented to person, place, and time and well-developed, well-nourished, and in no distress.  HENT:  Head:  Normocephalic and atraumatic.  Cardiovascular: Normal rate, regular rhythm and normal heart sounds.   No murmur heard. Pulmonary/Chest: Effort normal and breath sounds normal. She has no wheezes.  Neurological: She is alert and oriented to person, place, and time.  Psychiatric: Mood, memory, affect and judgment normal.  Nursing note and vitals reviewed.     Assessment & Plan  1. Essential hypertension BP elevated on manual repeat, we'll add amlodipine at 5 mg daily, continue lisinopril. Follow-up in one month - amLODipine (NORVASC) 5 MG tablet; Take 1 tablet (5 mg total)  by mouth daily.  Dispense: 90 tablet; Refill: 0 - lisinopril (PRINIVIL,ZESTRIL) 20 MG tablet; Take 1 tablet (20 mg total) by mouth daily.  Dispense: 90 tablet; Refill: 0  2. Bipolar disorder, in full remission, most recent episode mixed [facility](HCC) Referral to psychiatry for management of bipolar disorder. - Ambulatory referral to Psychiatry  3. Facial contracture Resolved, ER notes reviewed. Recommended referral to neurology.   Anshika Pethtel Asad A. Faylene KurtzShah [facility] 10/22/2016 4:00 PM

## 2016-11-17 ENCOUNTER — Ambulatory Visit: Payer: Managed Care, Other (non HMO) | Admitting: Family Medicine

## 2016-11-20 ENCOUNTER — Encounter: Payer: Self-pay | Admitting: Family Medicine

## 2016-11-20 ENCOUNTER — Ambulatory Visit (INDEPENDENT_AMBULATORY_CARE_PROVIDER_SITE_OTHER): Payer: Managed Care, Other (non HMO) | Admitting: Family Medicine

## 2016-11-20 VITALS — BP 138/76 | HR 98 | Temp 98.8°F | Resp 16 | Ht 64.0 in | Wt 241.7 lb

## 2016-11-20 DIAGNOSIS — I10 Essential (primary) hypertension: Secondary | ICD-10-CM

## 2016-11-20 NOTE — Progress Notes (Signed)
Name: Debra [name]   MRN: [id number]    DOB: 1961-04-01   Date:11/20/2016       Progress Note  Subjective  Chief Complaint  Chief Complaint  Patient presents with  . Hypertension    follow up 1 month for change in BP medication    Hypertension  This is a chronic problem. The problem has been gradually improving since onset. The problem is controlled. Pertinent negatives include no blurred vision, chest pain, headaches, orthopnea, palpitations or shortness of breath. Past treatments include ACE inhibitors and calcium channel blockers. Hypertensive end-organ damage includes CVA (had TIA 20 years ago). There is no history of kidney disease or CAD/MI.    Past Medical History:  Diagnosis Date  . Anxiety   . Arthritis   . Depression   . Diabetes mellitus without complication (HCC)    Type II  . Family history of adverse reaction to anesthesia    Mother - BP drops  . Head injury, closed, with brief LOC (HCC) 2011ish  . Hyperlipidemia   . Hypertension   . Insomnia   . Obesity   . Seasonal allergies   . Tobacco abuse     Past Surgical History:  Procedure Laterality Date  . ABDOMINAL HYSTERECTOMY  2012  . CESAREAN SECTION  1992  . CHOLECYSTECTOMY  2013  . LUMBAR LAMINECTOMY/DECOMPRESSION MICRODISCECTOMY Left 09/10/2015   Procedure: Left L4-5 Foraminotomy/Left L5-S1 Diskectomy;  Surgeon: Hilda LiasErnesto Botero, MD;  Location: [facility];  Service: Neurosurgery;  Laterality: Left;  Left L4-5 Foraminotomy/Left L5-S1 Diskectomy    Family History  Problem Relation Age of Onset  . COPD Mother   . Bipolar disorder Father   . Diabetes Mellitus II Father   . Bipolar disorder Brother   . Diabetes Mellitus II Brother     Social History   Social History  . Marital status: Married    Spouse name: N/A  . Number of children: N/A  . Years of education: N/A   Occupational History  . Not on file.   Social History Main Topics  . Smoking status: Current Every Day Smoker    Packs/day: 0.25     Years: 24.00    Types: Cigarettes  . Smokeless tobacco: Never Used  . Alcohol use No     Comment: occasional- rare  . Drug use: No  . Sexual activity: No   Other Topics Concern  . Not on file   Social History Narrative  . No narrative on file     Current Outpatient Prescriptions:  .  ACCU-CHEK SMARTVIEW test strip, , Disp: , Rfl:  .  Alcohol Swabs (ALCOHOL PREP) PADS, , Disp: , Rfl:  .  amLODipine (NORVASC) 5 MG tablet, Take 1 tablet (5 mg total) by mouth daily., Disp: 90 tablet, Rfl: 0 .  aspirin EC 81 MG tablet, Take 81 mg by mouth daily., Disp: , Rfl:  .  Bitter Melon 10 % POWD, by Does not apply route., Disp: , Rfl:  .  calcium carbonate (OSCAL) 1500 (600 Ca) MG TABS tablet, Take by mouth 2 (two) times daily with a meal., Disp: , Rfl:  .  cholecalciferol (VITAMIN D) 400 units TABS tablet, Take 400 Units by mouth., Disp: , Rfl:  .  folic acid (FOLVITE) 400 MCG tablet, Take 800 mcg by mouth daily. , Disp: , Rfl:  .  glipiZIDE (GLUCOTROL XL) 10 MG 24 hr tablet, Take 1 tablet (10 mg total) by mouth daily with breakfast. (Patient taking differently: Take 10  mg by mouth daily with breakfast. ), Disp: 90 tablet, Rfl: 0 .  insulin glargine (LANTUS) 100 UNIT/ML injection, Inject 60 Units into the skin 2 (two) times daily. , Disp: , Rfl:  .  lisinopril (PRINIVIL,ZESTRIL) 20 MG tablet, Take 1 tablet (20 mg total) by mouth daily., Disp: 90 tablet, Rfl: 0 .  Multiple Vitamins-Minerals (CENTRUM WOMEN) TABS, Take 1 tablet by mouth., Disp: , Rfl:  .  PARoxetine (PAXIL) 40 MG tablet, Take 1 tablet (40 mg total) by mouth daily., Disp: 90 tablet, Rfl: 1 .  Red Yeast Rice 600 MG TABS, Take 600 mg by mouth daily., Disp: , Rfl:  .  traZODone (DESYREL) 50 MG tablet, Take 1 tablet (50 mg total) by mouth at bedtime., Disp: 90 tablet, Rfl: 1  Allergies  Allergen Reactions  . Metformin And Related Diarrhea  . Benadryl [Diphenhydramine Hcl] Swelling    blistering  . Canagliflozin Other (See  Comments)    Low bp  . Cholesterol Other (See Comments)    meds cause her to pass out  . Depakote Er [Divalproex Sodium Er] Other (See Comments)    seizures  . Dilaudid [Hydromorphone Hcl] Other (See Comments)    seizures  . Diphenhydramine-Zinc Acetate Other (See Comments)  . Haloperidol Other (See Comments)  . Hydromorphone Other (See Comments)  . Neosporin [Neomycin-Bacitracin Zn-Polymyx] Swelling    blistering  . Singulair [Montelukast Sodium] Other (See Comments)    Asthma attacks  . Sitagliptin Other (See Comments)  . Statins Other (See Comments)  . Sulfa Antibiotics Hives  . Victoza [Liraglutide] Other (See Comments)    pancreatitis     Review of Systems  Eyes: Negative for blurred vision.  Respiratory: Negative for shortness of breath.   Cardiovascular: Negative for chest pain, palpitations and orthopnea.  Neurological: Negative for headaches.     Objective  Vitals:   11/20/16 1110  BP: 138/76  Pulse: 98  Resp: 16  Temp: 98.8 F (37.1 C)  TempSrc: Oral  SpO2: 95%  Weight: 241 lb 11.2 oz (109.6 kg)  Height: 5\' 4"  (1.626 m)    Physical Exam  Constitutional: She is oriented to person, place, and time and well-developed, well-nourished, and in no distress.  HENT:  Head: Normocephalic and atraumatic.  Cardiovascular: Normal rate, regular rhythm and normal heart sounds.   No murmur heard. Pulmonary/Chest: Effort normal and breath sounds normal. She has no wheezes.  Musculoskeletal:       Left ankle: She exhibits no swelling.  Neurological: She is alert and oriented to person, place, and time.  Psychiatric: Mood, memory, affect and judgment normal.  Nursing note and vitals reviewed.         Assessment & Plan  1. Essential hypertension BP stable and controlled on present anti-hypertensive therapy  Kalilah Barua Asad A. [facility] 11/20/2016 11:29 AM

## 2016-11-23 ENCOUNTER — Ambulatory Visit: Payer: Managed Care, Other (non HMO) | Admitting: Family Medicine

## 2016-12-18 ENCOUNTER — Other Ambulatory Visit: Payer: Self-pay | Admitting: Family Medicine

## 2016-12-18 DIAGNOSIS — G4700 Insomnia, unspecified: Secondary | ICD-10-CM

## 2016-12-18 DIAGNOSIS — F3176 Bipolar disorder, in full remission, most recent episode depressed: Secondary | ICD-10-CM

## 2016-12-22 ENCOUNTER — Telehealth: Payer: Self-pay

## 2016-12-22 DIAGNOSIS — I10 Essential (primary) hypertension: Secondary | ICD-10-CM

## 2016-12-22 MED ORDER — AMLODIPINE BESYLATE 5 MG PO TABS: 5.0000 mg | ORAL_TABLET | Freq: Every day | ORAL | 0 refills | Status: DC

## 2016-12-22 NOTE — Telephone Encounter (Signed)
Medication has been refilled and sent to Pharmacy °

## 2017-01-08 ENCOUNTER — Other Ambulatory Visit: Payer: Self-pay | Admitting: Emergency Medicine

## 2017-01-08 DIAGNOSIS — I10 Essential (primary) hypertension: Secondary | ICD-10-CM

## 2017-01-08 MED ORDER — LISINOPRIL 20 MG PO TABS: 20.0000 mg | ORAL_TABLET | Freq: Every day | ORAL | 0 refills | Status: DC

## 2017-01-11 DIAGNOSIS — Z794 Long term (current) use of insulin: Secondary | ICD-10-CM | POA: Diagnosis not present

## 2017-01-11 DIAGNOSIS — E1165 Type 2 diabetes mellitus with hyperglycemia: Secondary | ICD-10-CM | POA: Diagnosis not present

## 2017-01-11 DIAGNOSIS — E782 Mixed hyperlipidemia: Secondary | ICD-10-CM | POA: Diagnosis not present

## 2017-01-18 DIAGNOSIS — Z794 Long term (current) use of insulin: Secondary | ICD-10-CM | POA: Diagnosis not present

## 2017-01-18 DIAGNOSIS — F17200 Nicotine dependence, unspecified, uncomplicated: Secondary | ICD-10-CM | POA: Diagnosis not present

## 2017-01-18 DIAGNOSIS — E1165 Type 2 diabetes mellitus with hyperglycemia: Secondary | ICD-10-CM | POA: Diagnosis not present

## 2017-01-18 DIAGNOSIS — E782 Mixed hyperlipidemia: Secondary | ICD-10-CM | POA: Diagnosis not present

## 2017-01-21 ENCOUNTER — Ambulatory Visit (INDEPENDENT_AMBULATORY_CARE_PROVIDER_SITE_OTHER): Payer: Managed Care, Other (non HMO) | Admitting: Family Medicine

## 2017-01-21 ENCOUNTER — Encounter: Payer: Self-pay | Admitting: Family Medicine

## 2017-01-21 DIAGNOSIS — I10 Essential (primary) hypertension: Secondary | ICD-10-CM | POA: Diagnosis not present

## 2017-01-21 MED ORDER — LISINOPRIL 40 MG PO TABS: 40.0000 mg | ORAL_TABLET | Freq: Every day | ORAL | 0 refills | Status: DC

## 2017-01-21 NOTE — Progress Notes (Signed)
Name: Debra [name]   MRN: [id number]    DOB: [birth year]   Date:01/21/2017       Progress Note  Subjective  Chief Complaint  Chief Complaint  Patient presents with  . Hypertension    discuss Amlodipine, feet swelling, medication refill  . Follow-up    Hypertension  This is a chronic problem. The problem is unchanged. The problem is controlled. Associated symptoms include peripheral edema (swelling of both feet, likely a side effect of Amlodipine). Pertinent negatives include no blurred vision, chest pain, headaches, orthopnea, palpitations or shortness of breath. Past treatments include ACE inhibitors and calcium channel blockers. There is no history of kidney disease, CAD/MI or CVA.    Past Medical History:  Diagnosis Date  . Anxiety   . Arthritis   . Depression   . Diabetes mellitus without complication (HCC)    Type II  . Family history of adverse reaction to anesthesia    Mother - BP drops  . Head injury, closed, with brief LOC (HCC) 2011ish  . Hyperlipidemia   . Hypertension   . Insomnia   . Obesity   . Seasonal allergies   . Tobacco abuse     Past Surgical History:  Procedure Laterality Date  . ABDOMINAL HYSTERECTOMY  2012  . CESAREAN SECTION  1992  . CHOLECYSTECTOMY  2013  . LUMBAR LAMINECTOMY/DECOMPRESSION MICRODISCECTOMY Left 09/10/2015   Procedure: Left L4-5 Foraminotomy/Left L5-S1 Diskectomy;  Surgeon: Hilda LiasErnesto Botero, MD;  Location: [facility];  Service: Neurosurgery;  Laterality: Left;  Left L4-5 Foraminotomy/Left L5-S1 Diskectomy    Family History  Problem Relation Age of Onset  . COPD Mother   . Bipolar disorder Father   . Diabetes Mellitus II Father   . Bipolar disorder Brother   . Diabetes Mellitus II Brother     Social History   Social History  . Marital status: Married    Spouse name: N/A  . Number of children: N/A  . Years of education: N/A   Occupational History  . Not on file.   Social History Main Topics  . Smoking status: Current  Every Day Smoker    Packs/day: 0.25    Years: 24.00    Types: Cigarettes  . Smokeless tobacco: Never Used  . Alcohol use No     Comment: occasional- rare  . Drug use: No  . Sexual activity: No   Other Topics Concern  . Not on file   Social History Narrative  . No narrative on file     Current Outpatient Prescriptions:  .  ACCU-CHEK SMARTVIEW test strip, , Disp: , Rfl:  .  Alcohol Swabs (ALCOHOL PREP) PADS, , Disp: , Rfl:  .  amLODipine (NORVASC) 5 MG tablet, Take 1 tablet (5 mg total) by mouth daily., Disp: 90 tablet, Rfl: 0 .  aspirin EC 81 MG tablet, Take 81 mg by mouth daily., Disp: , Rfl:  .  Bitter Melon 10 % POWD, by Does not apply route., Disp: , Rfl:  .  calcium carbonate (OSCAL) 1500 (600 Ca) MG TABS tablet, Take by mouth 2 (two) times daily with a meal., Disp: , Rfl:  .  cholecalciferol (VITAMIN D) 400 units TABS tablet, Take 400 Units by mouth., Disp: , Rfl:  .  folic acid (FOLVITE) 400 MCG tablet, Take 800 mcg by mouth daily. , Disp: , Rfl:  .  glipiZIDE (GLUCOTROL XL) 10 MG 24 hr tablet, Take 1 tablet (10 mg total) by mouth daily with breakfast. (Patient taking differently:  Take 10 mg by mouth daily with breakfast. ), Disp: 90 tablet, Rfl: 0 .  insulin glargine (LANTUS) 100 UNIT/ML injection, Inject 60 Units into the skin 2 (two) times daily. , Disp: , Rfl:  .  lisinopril (PRINIVIL,ZESTRIL) 20 MG tablet, Take 1 tablet (20 mg total) by mouth daily., Disp: 90 tablet, Rfl: 0 .  Multiple Vitamins-Minerals (CENTRUM WOMEN) TABS, Take 1 tablet by mouth., Disp: , Rfl:  .  PARoxetine (PAXIL) 40 MG tablet, TAKE 1 TABLET BY MOUTH DAILY, Disp: 90 tablet, Rfl: 0 .  Red Yeast Rice 600 MG TABS, Take 600 mg by mouth daily., Disp: , Rfl:  .  traZODone (DESYREL) 50 MG tablet, TAKE 1 TABLET BY MOUTH AT BEDTIME, Disp: 90 tablet, Rfl: 0  Allergies  Allergen Reactions  . Metformin And Related Diarrhea  . Benadryl [Diphenhydramine Hcl] Swelling    blistering  . Canagliflozin Other (See  Comments)    Low bp  . Cholesterol Other (See Comments)    meds cause her to pass out  . Depakote Er [Divalproex Sodium Er] Other (See Comments)    seizures  . Dilaudid [Hydromorphone Hcl] Other (See Comments)    seizures  . Diphenhydramine-Zinc Acetate Other (See Comments)  . Haloperidol Other (See Comments)  . Hydromorphone Other (See Comments)  . Neosporin [Neomycin-Bacitracin Zn-Polymyx] Swelling    blistering  . Singulair [Montelukast Sodium] Other (See Comments)    Asthma attacks  . Sitagliptin Other (See Comments)  . Statins Other (See Comments)  . Sulfa Antibiotics Hives  . Victoza [Liraglutide] Other (See Comments)    pancreatitis     Review of Systems  Eyes: Negative for blurred vision.  Respiratory: Negative for shortness of breath.   Cardiovascular: Negative for chest pain, palpitations and orthopnea.  Neurological: Negative for headaches.     Objective  Vitals:   01/21/17 0906  BP: 132/84  Pulse: 99  Resp: 16  Temp: 98.8 F (37.1 C)  TempSrc: Oral  SpO2: 95%  Weight: 238 lb 4.8 oz (108.1 kg)  Height: 5\' 4"  (1.626 m)    Physical Exam  Constitutional: She is oriented to person, place, and time and well-developed, well-nourished, and in no distress.  HENT:  Head: Normocephalic and atraumatic.  Cardiovascular: Normal rate, regular rhythm and normal heart sounds.   No murmur heard. Pulmonary/Chest: Effort normal and breath sounds normal. She has no wheezes.  Abdominal: Soft. Bowel sounds are normal.  Musculoskeletal:       Right ankle: She exhibits no swelling.       Left ankle: She exhibits no swelling.  Neurological: She is alert and oriented to person, place, and time.  Psychiatric: Mood, memory, affect and judgment normal.  Nursing note and vitals reviewed.     Assessment & Plan  1. Essential hypertension DC amlodipine because of side effects, we will increase lisinopril to 40 mg daily, advised to check BP regularly, bring logs for  review.  - lisinopril (PRINIVIL,ZESTRIL) 40 MG tablet; Take 1 tablet (40 mg total) by mouth daily.  Dispense: 90 tablet; Refill: 0   Corin Formisano Asad A[facility] 01/21/2017 9:28 AM

## 2017-02-16 DIAGNOSIS — M25561 Pain in right knee: Secondary | ICD-10-CM | POA: Diagnosis not present

## 2017-02-16 DIAGNOSIS — M1380 Other specified arthritis, unspecified site: Secondary | ICD-10-CM | POA: Diagnosis not present

## 2017-02-25 ENCOUNTER — Ambulatory Visit
Admission: RE | Admit: 2017-02-25 | Discharge: 2017-02-25 | Disposition: A | Discharge status: Home / Self Care | Payer: Managed Care, Other (non HMO) | Source: Ambulatory Visit | Attending: Family Medicine | Admitting: Family Medicine

## 2017-02-25 DIAGNOSIS — N6489 Other specified disorders of breast: Secondary | ICD-10-CM | POA: Diagnosis not present

## 2017-02-25 DIAGNOSIS — Z1231 Encounter for screening mammogram for malignant neoplasm of breast: Secondary | ICD-10-CM | POA: Diagnosis not present

## 2017-03-08 ENCOUNTER — Other Ambulatory Visit: Payer: Self-pay | Admitting: Family Medicine

## 2017-03-08 DIAGNOSIS — G4700 Insomnia, unspecified: Secondary | ICD-10-CM

## 2017-03-08 DIAGNOSIS — F3176 Bipolar disorder, in full remission, most recent episode depressed: Secondary | ICD-10-CM

## 2017-03-09 ENCOUNTER — Inpatient Hospital Stay
Admission: RE | Admit: 2017-03-09 | Discharge: 2017-03-09 | Disposition: A | Discharge status: Home / Self Care | Payer: Self-pay | Source: Ambulatory Visit | Attending: *Deleted | Admitting: *Deleted

## 2017-03-09 ENCOUNTER — Other Ambulatory Visit: Payer: Self-pay | Admitting: *Deleted

## 2017-03-09 DIAGNOSIS — Z9289 Personal history of other medical treatment: Secondary | ICD-10-CM

## 2017-04-06 LAB — HEMOGLOBIN A1C: HEMOGLOBIN A1C: 9

## 2017-04-19 DIAGNOSIS — F17200 Nicotine dependence, unspecified, uncomplicated: Secondary | ICD-10-CM | POA: Diagnosis not present

## 2017-04-19 DIAGNOSIS — Z794 Long term (current) use of insulin: Secondary | ICD-10-CM | POA: Diagnosis not present

## 2017-04-19 DIAGNOSIS — I10 Essential (primary) hypertension: Secondary | ICD-10-CM | POA: Diagnosis not present

## 2017-04-19 DIAGNOSIS — L0292 Furuncle, unspecified: Secondary | ICD-10-CM | POA: Diagnosis not present

## 2017-04-19 DIAGNOSIS — E1165 Type 2 diabetes mellitus with hyperglycemia: Secondary | ICD-10-CM | POA: Diagnosis not present

## 2017-04-19 DIAGNOSIS — E782 Mixed hyperlipidemia: Secondary | ICD-10-CM | POA: Diagnosis not present

## 2017-04-20 ENCOUNTER — Ambulatory Visit (INDEPENDENT_AMBULATORY_CARE_PROVIDER_SITE_OTHER): Payer: Managed Care, Other (non HMO) | Admitting: Family Medicine

## 2017-04-20 ENCOUNTER — Encounter: Payer: Self-pay | Admitting: Family Medicine

## 2017-04-20 DIAGNOSIS — E785 Hyperlipidemia, unspecified: Secondary | ICD-10-CM | POA: Insufficient documentation

## 2017-04-20 DIAGNOSIS — F3176 Bipolar disorder, in full remission, most recent episode depressed: Secondary | ICD-10-CM

## 2017-04-20 DIAGNOSIS — I10 Essential (primary) hypertension: Secondary | ICD-10-CM | POA: Diagnosis not present

## 2017-04-20 DIAGNOSIS — E7800 Pure hypercholesterolemia, unspecified: Secondary | ICD-10-CM

## 2017-04-20 MED ORDER — LISINOPRIL 40 MG PO TABS: 40.0000 mg | ORAL_TABLET | Freq: Every day | ORAL | 0 refills | Status: DC

## 2017-04-20 MED ORDER — PAROXETINE HCL 40 MG PO TABS: 40.0000 mg | ORAL_TABLET | Freq: Every day | ORAL | 0 refills | Status: DC

## 2017-04-20 NOTE — Progress Notes (Signed)
Name: Debra [name]   MRN: [id number]    DOB: June 18, 1961   Date:04/20/2017       Progress Note  Subjective  Chief Complaint  Chief Complaint  Patient presents with  . Follow-up    3 mo  . Medication Refill    Hypertension  This is a chronic problem. The problem is unchanged. The problem is controlled. Pertinent negatives include no blurred vision, chest pain, headaches, palpitations or shortness of breath. Past treatments include ACE inhibitors. There is no history of kidney disease, CAD/MI or CVA.  Hyperlipidemia  This is a chronic problem. The problem is uncontrolled. Recent lipid tests were reviewed and are high. Exacerbating diseases include diabetes and obesity. Pertinent negatives include no chest pain or shortness of breath. Treatments tried: Has been on Red Yeast Rice by Endocrinologist.   Bipolar Disorder: She has seen Psychiatry in the past, symptoms are controlled, no fatigue, depressed mood, appetite is normal, takes Paroxetine 40 mg daily, no reported side effects.   Past Medical History:  Diagnosis Date  . Anxiety   . Arthritis   . Depression   . Diabetes mellitus without complication (HCC)    Type II  . Family history of adverse reaction to anesthesia    Mother - BP drops  . Head injury, closed, with brief LOC (HCC) 2011ish  . Hyperlipidemia   . Hypertension   . Insomnia   . Obesity   . Seasonal allergies   . Tobacco abuse     Past Surgical History:  Procedure Laterality Date  . ABDOMINAL HYSTERECTOMY  2012  . BREAST BIOPSY Right   . CESAREAN SECTION  1992  . CHOLECYSTECTOMY  2013  . LUMBAR LAMINECTOMY/DECOMPRESSION MICRODISCECTOMY Left 09/10/2015   Procedure: Left L4-5 Foraminotomy/Left L5-S1 Diskectomy;  Surgeon: Hilda Lias, MD;  Location: [facility];  Service: Neurosurgery;  Laterality: Left;  Left L4-5 Foraminotomy/Left L5-S1 Diskectomy    Family History  Problem Relation Age of Onset  . COPD Mother   . Bipolar disorder Father   . Diabetes  Mellitus II Father   . Bipolar disorder Brother   . Diabetes Mellitus II Brother     Social History   Social History  . Marital status: Married    Spouse name: N/A  . Number of children: N/A  . Years of education: N/A   Occupational History  . Not on file.   Social History Main Topics  . Smoking status: Current Every Day Smoker    Packs/day: 0.25    Years: 24.00    Types: Cigarettes  . Smokeless tobacco: Never Used  . Alcohol use No     Comment: occasional- rare  . Drug use: No  . Sexual activity: No   Other Topics Concern  . Not on file   Social History Narrative  . No narrative on file     Current Outpatient Prescriptions:  .  ACCU-CHEK SMARTVIEW test strip, , Disp: , Rfl:  .  Alcohol Swabs (ALCOHOL PREP) PADS, , Disp: , Rfl:  .  aspirin EC 81 MG tablet, Take 81 mg by mouth daily., Disp: , Rfl:  .  calcium carbonate (OSCAL) 1500 (600 Ca) MG TABS tablet, Take by mouth 2 (two) times daily with a meal., Disp: , Rfl:  .  cephALEXin (KEFLEX) 500 MG capsule, Take 500 mg by mouth 4 (four) times a week. For 10 Days, Disp: , Rfl:  .  cholecalciferol (VITAMIN D) 400 units TABS tablet, Take 400 Units by mouth., Disp: ,  Rfl:  .  folic acid (FOLVITE) 400 MCG tablet, Take 800 mcg by mouth daily. , Disp: , Rfl:  .  lisinopril (PRINIVIL,ZESTRIL) 40 MG tablet, Take 1 tablet (40 mg total) by mouth daily., Disp: 90 tablet, Rfl: 0 .  Multiple Vitamins-Minerals (CENTRUM WOMEN) TABS, Take 1 tablet by mouth., Disp: , Rfl:  .  PARoxetine (PAXIL) 40 MG tablet, TAKE 1 TABLET BY MOUTH DAILY, Disp: 90 tablet, Rfl: 0 .  Red Yeast Rice 600 MG TABS, Take 600 mg by mouth daily., Disp: , Rfl:  .  traZODone (DESYREL) 50 MG tablet, TAKE 1 TABLET BY MOUTH AT BEDTIME, Disp: 90 tablet, Rfl: 0 .  Bitter Melon 10 % POWD, by Does not apply route., Disp: , Rfl:  .  glipiZIDE (GLUCOTROL XL) 10 MG 24 hr tablet, Take 1 tablet (10 mg total) by mouth daily with breakfast. (Patient not taking: Reported on  04/20/2017), Disp: 90 tablet, Rfl: 0 .  HUMALOG KWIKPEN 100 UNIT/ML KiwkPen, , Disp: , Rfl:  .  insulin glargine (LANTUS) 100 UNIT/ML injection, Inject 60 Units into the skin 2 (two) times daily. , Disp: , Rfl:  .  TRESIBA FLEXTOUCH 200 UNIT/ML SOPN, 120 Units., Disp: , Rfl:   Allergies  Allergen Reactions  . Metformin And Related Diarrhea  . Benadryl [Diphenhydramine Hcl] Swelling    blistering  . Canagliflozin Other (See Comments)    Other reaction(s): Other (See Comments) Low bp Low bp Low bp  . Cholesterol Other (See Comments)    meds cause her to pass out  . Depakote Er [Divalproex Sodium Er] Other (See Comments)    seizures  . Dilaudid [Hydromorphone Hcl] Other (See Comments)    seizures  . Diphenhydramine-Zinc Acetate Other (See Comments)  . Haloperidol Other (See Comments)  . Hydromorphone Other (See Comments)  . Neosporin [Neomycin-Bacitracin Zn-Polymyx] Swelling    blistering  . Singulair [Montelukast Sodium] Other (See Comments)    Asthma attacks  . Sitagliptin Other (See Comments)  . Statins Other (See Comments)  . Sulfa Antibiotics Hives  . Victoza [Liraglutide] Other (See Comments) and Nausea And Vomiting    Other reaction(s): Other (See Comments) pancreatitis pancreatitis     Review of Systems  Eyes: Negative for blurred vision.  Respiratory: Negative for shortness of breath.   Cardiovascular: Negative for chest pain and palpitations.  Neurological: Negative for headaches.      Objective  Vitals:   04/20/17 0907  BP: 138/73  Pulse: (!) 102  Resp: 17  Temp: 98.5 F (36.9 C)  TempSrc: Oral  SpO2: 96%  Weight: 245 lb 3.2 oz (111.2 kg)  Height: 5\' 4"  (1.626 m)    Physical Exam  Constitutional: She is oriented to person, place, and time and well-developed, well-nourished, and in no distress.  HENT:  Head: Normocephalic and atraumatic.  Cardiovascular: Normal rate, regular rhythm and normal heart sounds.   No murmur heard. Pulmonary/Chest:  Effort normal and breath sounds normal. She has no wheezes.  Abdominal: Soft. Bowel sounds are normal.  Musculoskeletal:       Right ankle: She exhibits no swelling.       Left ankle: She exhibits no swelling.  Neurological: She is alert and oriented to person, place, and time.  Psychiatric: Mood, memory, affect and judgment normal.  Nursing note and vitals reviewed.    Assessment & Plan  1. Essential hypertension BP stable on present anti- hypertensive therapy - lisinopril (PRINIVIL,ZESTRIL) 40 MG tablet; Take 1 tablet (40 mg total) by mouth  daily.  Dispense: 90 tablet; Refill: 0  2. Pure hypercholesterolemia  - COMPLETE METABOLIC PANEL WITH GFR - Lipid panel  3. Bipolar disorder, in full remission, most recent episode depressed (HCC)  - PARoxetine (PAXIL) 40 MG tablet; Take 1 tablet (40 mg total) by mouth daily.  Dispense: 90 tablet; Refill: 0   Domenique Quest Asad [facility] 04/20/2017 9:12 AM

## 2017-06-03 ENCOUNTER — Emergency Department: Payer: Managed Care, Other (non HMO)

## 2017-06-03 ENCOUNTER — Encounter: Payer: Self-pay | Admitting: Emergency Medicine

## 2017-06-03 DIAGNOSIS — Y929 Unspecified place or not applicable: Secondary | ICD-10-CM | POA: Diagnosis not present

## 2017-06-03 DIAGNOSIS — Y999 Unspecified external cause status: Secondary | ICD-10-CM | POA: Insufficient documentation

## 2017-06-03 DIAGNOSIS — S8992XA Unspecified injury of left lower leg, initial encounter: Secondary | ICD-10-CM | POA: Diagnosis present

## 2017-06-03 DIAGNOSIS — F17210 Nicotine dependence, cigarettes, uncomplicated: Secondary | ICD-10-CM | POA: Insufficient documentation

## 2017-06-03 DIAGNOSIS — W1789XA Other fall from one level to another, initial encounter: Secondary | ICD-10-CM | POA: Diagnosis not present

## 2017-06-03 DIAGNOSIS — I10 Essential (primary) hypertension: Secondary | ICD-10-CM | POA: Diagnosis not present

## 2017-06-03 DIAGNOSIS — Z7982 Long term (current) use of aspirin: Secondary | ICD-10-CM | POA: Diagnosis not present

## 2017-06-03 DIAGNOSIS — Y939 Activity, unspecified: Secondary | ICD-10-CM | POA: Insufficient documentation

## 2017-06-03 DIAGNOSIS — E119 Type 2 diabetes mellitus without complications: Secondary | ICD-10-CM | POA: Insufficient documentation

## 2017-06-03 DIAGNOSIS — Z79899 Other long term (current) drug therapy: Secondary | ICD-10-CM | POA: Diagnosis not present

## 2017-06-03 DIAGNOSIS — Z794 Long term (current) use of insulin: Secondary | ICD-10-CM | POA: Diagnosis not present

## 2017-06-03 DIAGNOSIS — S8012XA Contusion of left lower leg, initial encounter: Secondary | ICD-10-CM | POA: Diagnosis not present

## 2017-06-03 DIAGNOSIS — M7989 Other specified soft tissue disorders: Secondary | ICD-10-CM | POA: Diagnosis not present

## 2017-06-03 NOTE — ED Triage Notes (Signed)
Pt ambulatory to triage in NAD, report fell off exercise ball and hit left lower leg on elliptical on Monday, reports pain, bruising, and swelling.  CSM intact

## 2017-06-04 ENCOUNTER — Emergency Department
Admission: EM | Admit: 2017-06-04 | Discharge: 2017-06-04 | Disposition: A | Discharge status: Home / Self Care | Payer: Managed Care, Other (non HMO) | Attending: Emergency Medicine | Admitting: Emergency Medicine

## 2017-06-04 DIAGNOSIS — S8012XA Contusion of left lower leg, initial encounter: Secondary | ICD-10-CM

## 2017-06-04 MED ORDER — CEPHALEXIN 500 MG PO CAPS: 500.0000 mg | ORAL_CAPSULE | Freq: Three times a day (TID) | ORAL | 0 refills | Status: DC

## 2017-06-04 MED ORDER — KETOROLAC TROMETHAMINE 30 MG/ML IJ SOLN
60.0000 mg | Freq: Once | INTRAMUSCULAR | Status: AC
  Administered 2017-06-04: 60 mg via INTRAMUSCULAR
  Filled 2017-06-04: qty 2

## 2017-06-04 MED ORDER — CEPHALEXIN 500 MG PO CAPS
500.0000 mg | ORAL_CAPSULE | Freq: Once | ORAL | Status: AC
  Administered 2017-06-04: 500 mg via ORAL
  Filled 2017-06-04: qty 1

## 2017-06-04 NOTE — ED Provider Notes (Signed)
[facility] Emergency Department Provider Note   ____________________________________________   First MD Initiated Contact with Patient 06/04/17 [phone]     (approximate)  I have reviewed the triage vital signs and the nursing notes.   HISTORY  Chief Complaint Leg Injury    HPI Debra [name] is a 56 y.o. female who presents to the ED from home with a chief complaint of left leg pain.Patient reports falling off of an exercise ball striking her left shin on an elliptical machine 4 days ago. Reports continued pain and worsening bruising. Denies anticoagulant use. Denies striking head or LOC. Denies fever, chills, chest pain, shortness of breath, abdominal pain, nausea, vomiting. Has been applying ice without relief of symptoms.   Past Medical History:  Diagnosis Date  . Anxiety   . Arthritis   . Depression   . Diabetes mellitus without complication (HCC)    Type II  . Family history of adverse reaction to anesthesia    Mother - BP drops  . Head injury, closed, with brief LOC (HCC) 2011ish  . Hyperlipidemia   . Hypertension   . Insomnia   . Obesity   . Seasonal allergies   . Tobacco abuse     Patient Active Problem List   Diagnosis Date Noted  . Hyperlipidemia 04/20/2017  . Facial contracture 10/22/2016  . Insomnia 06/15/2016  . Oral candidiasis 12/11/2015  . Urinary tract infection symptoms 12/11/2015  . Need for immunization against influenza 08/28/2015  . Acute low back pain with radicular symptoms, duration less than 6 weeks 08/28/2015  . Need for vaccination 08/21/2015  . Lumbar disc disease with radiculopathy 08/20/2015  . Lumbar herniated disc 08/20/2015  . Uncontrolled diabetes mellitus (HCC) 08/20/2015  . Left-sided low back pain with sciatica 06/03/2015  . Abdominal gas pain 06/03/2015  . Abdominal pain, generalized 06/03/2015  . Bipolar disorder (HCC) 06/03/2015  . Insomnia, persistent 06/03/2015  . BP (high blood pressure)  06/03/2015  . Type 2 diabetes mellitus treated with insulin (HCC) 06/03/2015  . Leg swelling 06/03/2015  . Extreme obesity 06/03/2015  . Compulsive tobacco user syndrome 06/03/2015  . Vaginitis due to Candida 06/03/2015  . Phlebectasia 06/03/2015    Past Surgical History:  Procedure Laterality Date  . ABDOMINAL HYSTERECTOMY  2012  . BREAST BIOPSY Right   . CESAREAN SECTION  1992  . CHOLECYSTECTOMY  2013  . LUMBAR LAMINECTOMY/DECOMPRESSION MICRODISCECTOMY Left 09/10/2015   Procedure: Left L4-5 Foraminotomy/Left L5-S1 Diskectomy;  Surgeon: Hilda Lias, MD;  Location: [facility];  Service: Neurosurgery;  Laterality: Left;  Left L4-5 Foraminotomy/Left L5-S1 Diskectomy    Prior to Admission medications   Medication Sig Start Date End Date Taking? Authorizing Provider  ACCU-CHEK SMARTVIEW test strip  08/18/15   [provider]  Alcohol Swabs (ALCOHOL PREP) PADS  08/18/15   [provider]  aspirin EC 81 MG tablet Take 81 mg by mouth daily.    [provider]  Bitter Melon 10 % POWD by Does not apply route.    [provider]  calcium carbonate (OSCAL) 1500 (600 Ca) MG TABS tablet Take by mouth 2 (two) times daily with a meal.    [provider]  cholecalciferol (VITAMIN D) 400 units TABS tablet Take 400 Units by mouth.    [provider]  folic acid (FOLVITE) 400 MCG tablet Take 800 mcg by mouth daily.     [provider]  glipiZIDE (GLUCOTROL XL) 10 MG 24 hr tablet Take 1  tablet (10 mg total) by mouth daily with breakfast. Patient not taking: Reported on 04/20/2017 06/27/15   Ellyn Hack, MD  HUMALOG KWIKPEN 100 UNIT/ML [facility]  03/29/17   [provider]  insulin glargine (LANTUS) 100 UNIT/ML injection Inject 60 Units into the skin 2 (two) times daily.     [provider]  lisinopril (PRINIVIL,ZESTRIL) 40 MG tablet Take 1 tablet (40 mg total) by mouth daily. 04/20/17   Ellyn Hack, MD  Multiple  Vitamins-Minerals (CENTRUM WOMEN) TABS Take 1 tablet by mouth.    [provider]  PARoxetine (PAXIL) 40 MG tablet Take 1 tablet (40 mg total) by mouth daily. 04/20/17   Ellyn Hack, MD  Red Yeast Rice 600 MG TABS Take 600 mg by mouth daily.    [provider]  traZODone (DESYREL) 50 MG tablet TAKE 1 TABLET BY MOUTH AT BEDTIME 03/10/17   Ellyn Hack, MD  TRESIBA FLEXTOUCH 200 UNIT/ML SOPN 120 Units. 03/29/17   [provider]    Allergies Metformin and related; Benadryl [diphenhydramine hcl]; Canagliflozin; Cholesterol; Depakote er [divalproex sodium er]; Dilaudid [hydromorphone hcl]; Diphenhydramine-zinc acetate; Haloperidol; Hydromorphone; Neosporin [neomycin-bacitracin zn-polymyx]; Singulair [montelukast sodium]; Sitagliptin; Statins; Sulfa antibiotics; and Victoza [liraglutide]  Family History  Problem Relation Age of Onset  . COPD Mother   . Bipolar disorder Father   . Diabetes Mellitus II Father   . Bipolar disorder Brother   . Diabetes Mellitus II Brother     Social History Social History  Substance Use Topics  . Smoking status: Current Every Day Smoker    Packs/day: 0.25    Years: 24.00    Types: Cigarettes  . Smokeless tobacco: Never Used  . Alcohol use No     Comment: occasional- rare    Review of Systems  Constitutional: No fever/chills. Eyes: No visual changes. ENT: No sore throat. Cardiovascular: Denies chest pain. Respiratory: Denies shortness of breath. Gastrointestinal: No abdominal pain.  No nausea, no vomiting.  No diarrhea.  No constipation. Genitourinary: Negative for dysuria. Musculoskeletal: Positive for left leg pain. Negative for back pain. Skin: Negative for rash. Neurological: Negative for headaches, focal weakness or numbness.   ____________________________________________   PHYSICAL EXAM:  VITAL SIGNS: ED Triage Vitals  Enc Vitals Group     BP 06/03/17 2312 140/76     Pulse Rate 06/03/17 2312 81      Resp 06/03/17 2312 18     Temp 06/03/17 2312 98.3 F (36.8 C)     Temp Source 06/03/17 2312 Oral     SpO2 06/03/17 2312 96 %     Weight 06/03/17 2313 234 lb (106.1 kg)     Height 06/03/17 2313 5\' 5"  (1.651 m)     Head Circumference --      Peak Flow --      Pain Score 06/03/17 2314 8     Pain Loc --      Pain Edu? --      Excl. in GC? --     Constitutional: Alert and oriented. Well appearing and in no acute distress. Eyes: Conjunctivae are normal. PERRL. EOMI. Head: Atraumatic. Nose: No congestion/rhinnorhea. Mouth/Throat: Mucous membranes are moist.  Oropharynx non-erythematous. Neck: No stridor.  No cervical spine tenderness to palpation. Cardiovascular: Normal rate, regular rhythm. Grossly normal heart sounds.  Good peripheral circulation. Respiratory: Normal respiratory effort.  No retractions. Lungs CTAB. Gastrointestinal: Soft and nontender. No distention. No abdominal bruits. No CVA tenderness. Musculoskeletal: Left anterior leg  with palm-sized area of ecchymosis, no hematoma. Some warmth felt over the area. Calf is supple without evidence for compartment syndrome. 2+ distal pulses. Brisk, less than 5 second capillary refill.  Neurologic:  Normal speech and language. No gross focal neurologic deficits are appreciated. No gait instability. Ambulated to treatment room without difficulty. Skin:  Skin is warm, dry and intact. No rash noted. Psychiatric: Mood and affect are normal. Speech and behavior are normal.  ____________________________________________   LABS (all labs ordered are listed, but only abnormal results are displayed)  Labs Reviewed - No data to display ____________________________________________  EKG  None ____________________________________________  RADIOLOGY  Dg Tibia/fibula Left  Result Date: 06/04/2017 CLINICAL DATA:  Left lower leg pain and swelling following injury 3 days ago. Initial encounter. EXAM: LEFT TIBIA AND FIBULA - 2 VIEW COMPARISON:   None. FINDINGS: There is no evidence of acute fracture, subluxation or dislocation. Mild soft tissue swelling noted. No focal bony lesions are identified. IMPRESSION: Mild soft tissue swelling without acute bony abnormality. Electronically Signed   By: Harmon PierJeffrey  Hu M.D.   On: 06/04/2017 00:02    ____________________________________________   PROCEDURES  Procedure(s) performed: None  Procedures  Critical Care performed: No  ____________________________________________   INITIAL IMPRESSION / ASSESSMENT AND PLAN / ED COURSE  Pertinent labs & imaging results that were available during my care of the patient were reviewed by me and considered in my medical decision making (see chart for details).  56 year old female who presents with left leg ecchymosis and contusion after striking it 4 days ago. She is not on anticoagulants. Given warmth felt over the area and the fact that patient is a diabetic, will treat empirically with Keflex to prevent cellulitis. Strict return precautions given. Patient verbalizes understanding and agrees with plan of care.      ____________________________________________   FINAL CLINICAL IMPRESSION(S) / ED DIAGNOSES  Final diagnoses:  Contusion of left lower leg, initial encounter      NEW MEDICATIONS STARTED DURING THIS VISIT:  New Prescriptions   No medications on file     Note:  This document was prepared using Dragon voice recognition software and may include unintentional dictation errors.    Irean HongSung, Sherrise Liberto J, MD 06/04/17 [phone]

## 2017-06-04 NOTE — Discharge Instructions (Signed)
1. Take antibiotic as prescribed for possible developing skin infection (Keflex 500 mg 3 times daily 7 days). 2. Elevate affected area and apply ice several times daily. 3. Return to the ER for worsening symptoms, persistent vomiting, difficulty breathing or other concerns.

## 2017-06-04 NOTE — ED Notes (Signed)
Pt states that she was exercising on an "exercise ball" when she fell off and hit the eliptical machine with her leg.

## 2017-06-07 ENCOUNTER — Encounter: Payer: Self-pay | Admitting: Family Medicine

## 2017-06-07 ENCOUNTER — Ambulatory Visit (INDEPENDENT_AMBULATORY_CARE_PROVIDER_SITE_OTHER): Payer: Managed Care, Other (non HMO) | Admitting: Family Medicine

## 2017-06-07 ENCOUNTER — Ambulatory Visit
Admission: RE | Admit: 2017-06-07 | Discharge: 2017-06-07 | Disposition: A | Discharge status: Home / Self Care | Payer: Managed Care, Other (non HMO) | Source: Ambulatory Visit | Attending: Family Medicine | Admitting: Family Medicine

## 2017-06-07 VITALS — BP 172/94 | HR 100 | Temp 97.8°F | Resp 16 | Ht 65.0 in | Wt 249.8 lb

## 2017-06-07 DIAGNOSIS — T3695XA Adverse effect of unspecified systemic antibiotic, initial encounter: Secondary | ICD-10-CM

## 2017-06-07 DIAGNOSIS — S8012XD Contusion of left lower leg, subsequent encounter: Secondary | ICD-10-CM | POA: Diagnosis not present

## 2017-06-07 DIAGNOSIS — M79662 Pain in left lower leg: Secondary | ICD-10-CM | POA: Diagnosis not present

## 2017-06-07 DIAGNOSIS — B379 Candidiasis, unspecified: Secondary | ICD-10-CM | POA: Diagnosis not present

## 2017-06-07 DIAGNOSIS — I10 Essential (primary) hypertension: Secondary | ICD-10-CM

## 2017-06-07 DIAGNOSIS — M7989 Other specified soft tissue disorders: Secondary | ICD-10-CM

## 2017-06-07 DIAGNOSIS — X58XXXD Exposure to other specified factors, subsequent encounter: Secondary | ICD-10-CM | POA: Diagnosis not present

## 2017-06-07 LAB — HM DIABETES EYE EXAM

## 2017-06-07 MED ORDER — FLUCONAZOLE 150 MG PO TABS: 150.0000 mg | ORAL_TABLET | Freq: Once | ORAL | 0 refills | Status: AC

## 2017-06-07 MED ORDER — LISINOPRIL-HYDROCHLOROTHIAZIDE 20-12.5 MG PO TABS: 1.0000 | ORAL_TABLET | Freq: Every day | ORAL | 0 refills | Status: DC

## 2017-06-07 NOTE — Progress Notes (Signed)
Name: LURAE HORNBROOK   MRN: [id number]    DOB: 1961-10-19   Date:06/07/2017       Progress Note  Subjective  Chief Complaint  Chief Complaint  Patient presents with  . Hospitalization Follow-up    Left leg infected; pt hit her leg    HPI  Pt. Presents for follow up from the ER. She sustained a fall from the exercise ball on to the elliptical machine and hitting her left antero-medial thigh, it was swollen and turned purple the following day, went in to the ER and was given antibiotic to relieve possible infection. X ray of the affected area showed mild soft tissue swelling without any acute bony abnormality.  She reports that the affected area has improved, still warm, tender to touch, applying ice helps relieve some swelling, she has been walking without any assistive device. In addition, she has been experiencing symptoms of a vaginal yeast infection after having been on antibiotic for left leg swelling.      Past Medical History:  Diagnosis Date  . Anxiety   . Arthritis   . Depression   . Diabetes mellitus without complication (HCC)    Type II  . Family history of adverse reaction to anesthesia    Mother - BP drops  . Head injury, closed, with brief LOC (HCC) 2011ish  . Hyperlipidemia   . Hypertension   . Insomnia   . Obesity   . Seasonal allergies   . Tobacco abuse     Past Surgical History:  Procedure Laterality Date  . ABDOMINAL HYSTERECTOMY  2012  . BREAST BIOPSY Right   . CESAREAN SECTION  1992  . CHOLECYSTECTOMY  2013  . LUMBAR LAMINECTOMY/DECOMPRESSION MICRODISCECTOMY Left 09/10/2015   Procedure: Left L4-5 Foraminotomy/Left L5-S1 Diskectomy;  Surgeon: Hilda Lias, MD;  Location: [facility];  Service: Neurosurgery;  Laterality: Left;  Left L4-5 Foraminotomy/Left L5-S1 Diskectomy    Family History  Problem Relation Age of Onset  . COPD Mother   . Bipolar disorder Father   . Diabetes Mellitus II Father   . Bipolar disorder Brother   . Diabetes Mellitus II  Brother     Social History   Social History  . Marital status: Married    Spouse name: N/A  . Number of children: N/A  . Years of education: N/A   Occupational History  . Not on file.   Social History Main Topics  . Smoking status: Current Every Day Smoker    Packs/day: 0.25    Years: 24.00    Types: Cigarettes  . Smokeless tobacco: Never Used  . Alcohol use No     Comment: occasional- rare  . Drug use: No  . Sexual activity: No   Other Topics Concern  . Not on file   Social History Narrative  . No narrative on file     Current Outpatient Prescriptions:  .  ACCU-CHEK SMARTVIEW test strip, , Disp: , Rfl:  .  Alcohol Swabs (ALCOHOL PREP) PADS, , Disp: , Rfl:  .  aspirin EC 81 MG tablet, Take 81 mg by mouth daily., Disp: , Rfl:  .  calcium carbonate (OSCAL) 1500 (600 Ca) MG TABS tablet, Take by mouth 2 (two) times daily with a meal., Disp: , Rfl:  .  cephALEXin (KEFLEX) 500 MG capsule, Take 1 capsule (500 mg total) by mouth 3 (three) times daily., Disp: 21 capsule, Rfl: 0 .  Cholecalciferol 5000 units capsule, Take 5,000 Units by mouth. , Disp: , Rfl:  .  folic acid (FOLVITE) 400 MCG tablet, Take 800 mcg by mouth daily. , Disp: , Rfl:  .  HUMALOG KWIKPEN 100 UNIT/ML KiwkPen, , Disp: , Rfl:  .  lisinopril (PRINIVIL,ZESTRIL) 40 MG tablet, Take 1 tablet (40 mg total) by mouth daily., Disp: 90 tablet, Rfl: 0 .  Multiple Vitamins-Minerals (CENTRUM WOMEN) TABS, Take 1 tablet by mouth., Disp: , Rfl:  .  PARoxetine (PAXIL) 40 MG tablet, Take 1 tablet (40 mg total) by mouth daily., Disp: 90 tablet, Rfl: 0 .  traZODone (DESYREL) 50 MG tablet, TAKE 1 TABLET BY MOUTH AT BEDTIME, Disp: 90 tablet, Rfl: 0 .  TRESIBA FLEXTOUCH 200 UNIT/ML SOPN, 120 Units., Disp: , Rfl:   Allergies  Allergen Reactions  . Metformin And Related Diarrhea  . Benadryl [Diphenhydramine Hcl] Swelling    blistering  . Canagliflozin Other (See Comments)    Other reaction(s): Other (See Comments) Low bp Low  bp Low bp  . Cholesterol Other (See Comments)    meds cause her to pass out  . Depakote Er [Divalproex Sodium Er] Other (See Comments)    seizures  . Dilaudid [Hydromorphone Hcl] Other (See Comments)    seizures  . Diphenhydramine-Zinc Acetate Other (See Comments)  . Haloperidol Other (See Comments)  . Hydromorphone Other (See Comments)  . Neosporin [Neomycin-Bacitracin Zn-Polymyx] Swelling    blistering  . Singulair [Montelukast Sodium] Other (See Comments)    Asthma attacks  . Sitagliptin Other (See Comments)  . Statins Other (See Comments)  . Sulfa Antibiotics Hives  . Victoza [Liraglutide] Other (See Comments) and Nausea And Vomiting    Other reaction(s): Other (See Comments) pancreatitis pancreatitis     ROS  Please see history of present illness for complete description of ROS  Objective  Vitals:   06/07/17 1506  BP: (!) 172/94  Pulse: 100  Resp: 16  Temp: 97.8 F (36.6 C)  TempSrc: Oral  SpO2: 94%  Weight: 249 lb 12.8 oz (113.3 kg)  Height: 5\' 5"  (1.651 m)    Physical Exam  Constitutional: She is oriented to person, place, and time and well-developed, well-nourished, and in no distress.  HENT:  Head: Normocephalic and atraumatic.  Cardiovascular: Normal rate, regular rhythm and normal heart sounds.   No murmur heard. Pulmonary/Chest: Effort normal and breath sounds normal. She has no wheezes.  Musculoskeletal: She exhibits no edema.       Left lower leg: She exhibits tenderness and swelling.       Legs: Tenderness to palpation over the left lower leg around the gastrocnemius muscle, area of erythema and ecchymosis, tenderness to palpation.    Neurological: She is alert and oriented to person, place, and time.  Psychiatric: Affect and judgment normal. Her mood appears anxious. She exhibits normal new learning ability.  Nursing note and vitals reviewed.     Recent Results (from the past 2160 hour(s))  Hemoglobin A1c     Status: None   Collection  Time: 04/06/17 12:00 AM  Result Value Ref Range   Hemoglobin A1C 9.0      Assessment & Plan  1. Contusion of left lower leg, subsequent encounter Improving, ER notes reviewed, advised to apply ice at the site of impact   2. Pain and swelling of left lower leg Ultrasound of left lower extremity was negative, patient was reassured - US Venous Img Lower Unilateral Left; Future  3. Essential hypertension Elevated blood pressure, we will add hydrochlorothiazide 12.5 mg to patient's regimen, decrease lisinopril to 20 mg, advised to  recheck blood pressure in one month - lisinopril-hydrochlorothiazide (ZESTORETIC) 20-12.5 MG tablet; Take 1 tablet by mouth daily.  Dispense: 90 tablet; Refill: 0  4. Antibiotic-induced yeast infection  - fluconazole (DIFLUCAN) 150 MG tablet; Take 1 tablet (150 mg total) by mouth once.  Dispense: 1 tablet; Refill: 0    Lissie Hinesley Asad A. Faylene KurtzShah [facility] 06/07/2017 3:24 PM

## 2017-07-08 ENCOUNTER — Encounter: Payer: Self-pay | Admitting: Family Medicine

## 2017-07-08 ENCOUNTER — Ambulatory Visit (INDEPENDENT_AMBULATORY_CARE_PROVIDER_SITE_OTHER): Payer: Managed Care, Other (non HMO) | Admitting: Family Medicine

## 2017-07-08 VITALS — BP 148/80 | HR 103 | Temp 98.0°F | Resp 17 | Ht 65.0 in | Wt 249.0 lb

## 2017-07-08 DIAGNOSIS — H938X3 Other specified disorders of ear, bilateral: Secondary | ICD-10-CM | POA: Diagnosis not present

## 2017-07-08 DIAGNOSIS — I10 Essential (primary) hypertension: Secondary | ICD-10-CM

## 2017-07-08 DIAGNOSIS — G4700 Insomnia, unspecified: Secondary | ICD-10-CM | POA: Diagnosis not present

## 2017-07-08 DIAGNOSIS — F3176 Bipolar disorder, in full remission, most recent episode depressed: Secondary | ICD-10-CM | POA: Diagnosis not present

## 2017-07-08 MED ORDER — LISINOPRIL-HYDROCHLOROTHIAZIDE 20-12.5 MG PO TABS: 1.0000 | ORAL_TABLET | Freq: Every day | ORAL | 0 refills | Status: DC

## 2017-07-08 MED ORDER — PAROXETINE HCL 40 MG PO TABS: 40.0000 mg | ORAL_TABLET | Freq: Every day | ORAL | 0 refills | Status: DC

## 2017-07-08 MED ORDER — TRAZODONE HCL 50 MG PO TABS: 50.0000 mg | ORAL_TABLET | Freq: Every day | ORAL | 0 refills | Status: DC

## 2017-07-08 NOTE — Progress Notes (Signed)
Name: Debra [name]   MRN: [id number]    DOB: 04/25/1961   Date:07/08/2017       Progress Note  Subjective  Chief Complaint  Chief Complaint  Patient presents with  . Follow-up    1 mo  . Medication Refill  . Hypertension    Hypertension  This is a chronic problem. The problem is unchanged. The problem is controlled. Pertinent negatives include no blurred vision, chest pain, headaches, palpitations or shortness of breath. Past treatments include ACE inhibitors and diuretics. There is no history of kidney disease, CAD/MI or CVA.  Insomnia  Primary symptoms: no difficulty falling asleep, no frequent awakening, no premature morning awakening.  The onset quality is gradual. The symptoms are aggravated by anxiety. Past treatments include medication. Typical bedtime:  Other (sfalls asleep at 2-3 AM, wakes up at 10 Am, then takes a nap at 4-5 PM until 8 PM).  PMH includes: hypertension, depression.      Past Medical History:  Diagnosis Date  . Anxiety   . Arthritis   . Depression   . Diabetes mellitus without complication (HCC)    Type II  . Family history of adverse reaction to anesthesia    Mother - BP drops  . Head injury, closed, with brief LOC (HCC) 2011ish  . Hyperlipidemia   . Hypertension   . Insomnia   . Obesity   . Seasonal allergies   . Tobacco abuse     Past Surgical History:  Procedure Laterality Date  . ABDOMINAL HYSTERECTOMY  2012  . BREAST BIOPSY Right   . CESAREAN SECTION  1992  . CHOLECYSTECTOMY  2013  . LUMBAR LAMINECTOMY/DECOMPRESSION MICRODISCECTOMY Left 09/10/2015   Procedure: Left L4-5 Foraminotomy/Left L5-S1 Diskectomy;  Surgeon: Hilda Lias, MD;  Location: [facility];  Service: Neurosurgery;  Laterality: Left;  Left L4-5 Foraminotomy/Left L5-S1 Diskectomy    Family History  Problem Relation Age of Onset  . COPD Mother   . Bipolar disorder Father   . Diabetes Mellitus II Father   . Bipolar disorder Brother   . Diabetes Mellitus II Brother      Social History   Social History  . Marital status: Married    Spouse name: N/A  . Number of children: N/A  . Years of education: N/A   Occupational History  . Not on file.   Social History Main Topics  . Smoking status: Current Every Day Smoker    Packs/day: 0.25    Years: 24.00    Types: Cigarettes  . Smokeless tobacco: Never Used  . Alcohol use No     Comment: occasional- rare  . Drug use: No  . Sexual activity: No   Other Topics Concern  . Not on file   Social History Narrative  . No narrative on file     Current Outpatient Prescriptions:  .  ACCU-CHEK SMARTVIEW test strip, , Disp: , Rfl:  .  Alcohol Swabs (ALCOHOL PREP) PADS, , Disp: , Rfl:  .  aspirin EC 81 MG tablet, Take 81 mg by mouth daily., Disp: , Rfl:  .  calcium carbonate (OSCAL) 1500 (600 Ca) MG TABS tablet, Take by mouth 2 (two) times daily with a meal., Disp: , Rfl:  .  Cholecalciferol 5000 units capsule, Take 5,000 Units by mouth. , Disp: , Rfl:  .  folic acid (FOLVITE) 400 MCG tablet, Take 800 mcg by mouth daily. , Disp: , Rfl:  .  HUMALOG KWIKPEN 100 UNIT/ML KiwkPen, , Disp: , Rfl:  .  lisinopril-hydrochlorothiazide (ZESTORETIC) 20-12.5 MG tablet, Take 1 tablet by mouth daily., Disp: 90 tablet, Rfl: 0 .  Multiple Vitamins-Minerals (CENTRUM WOMEN) TABS, Take 1 tablet by mouth., Disp: , Rfl:  .  PARoxetine (PAXIL) 40 MG tablet, Take 1 tablet (40 mg total) by mouth daily., Disp: 90 tablet, Rfl: 0 .  traZODone (DESYREL) 50 MG tablet, TAKE 1 TABLET BY MOUTH AT BEDTIME, Disp: 90 tablet, Rfl: 0 .  TRESIBA FLEXTOUCH 200 UNIT/ML SOPN, 120 Units., Disp: , Rfl:  .  cephALEXin (KEFLEX) 500 MG capsule, Take 1 capsule (500 mg total) by mouth 3 (three) times daily. (Patient not taking: Reported on 07/08/2017), Disp: 21 capsule, Rfl: 0  Allergies  Allergen Reactions  . Metformin And Related Diarrhea  . Benadryl [Diphenhydramine Hcl] Swelling    blistering  . Canagliflozin Other (See Comments)    Other  reaction(s): Other (See Comments) Low bp Low bp Low bp  . Cholesterol Other (See Comments)    meds cause her to pass out  . Depakote Er [Divalproex Sodium Er] Other (See Comments)    seizures  . Dilaudid [Hydromorphone Hcl] Other (See Comments)    seizures  . Diphenhydramine-Zinc Acetate Other (See Comments)  . Haloperidol Other (See Comments)  . Hydromorphone Other (See Comments)  . Neosporin [Neomycin-Bacitracin Zn-Polymyx] Swelling    blistering  . Singulair [Montelukast Sodium] Other (See Comments)    Asthma attacks  . Sitagliptin Other (See Comments)  . Statins Other (See Comments)  . Sulfa Antibiotics Hives  . Victoza [Liraglutide] Other (See Comments) and Nausea And Vomiting    Other reaction(s): Other (See Comments) pancreatitis pancreatitis     Review of Systems  Eyes: Negative for blurred vision.  Respiratory: Negative for shortness of breath.   Cardiovascular: Negative for chest pain and palpitations.  Neurological: Negative for headaches.  Psychiatric/Behavioral: Positive for depression. The patient has insomnia.      Objective  Vitals:   07/08/17 1347  BP: (!) 148/80  Pulse: (!) 103  Resp: 17  Temp: 98 F (36.7 C)  TempSrc: Oral  SpO2: 95%  Weight: 249 lb (112.9 kg)  Height: 5\' 5"  (1.651 m)    Physical Exam  Constitutional: She is oriented to person, place, and time and well-developed, well-nourished, and in no distress.  HENT:  Head: Normocephalic and atraumatic.  Right > left ear canal cerumen impaction.  Cardiovascular: Normal rate, regular rhythm, S1 normal, S2 normal and normal heart sounds.   Pulmonary/Chest: Effort normal and breath sounds normal.  Musculoskeletal:       Right ankle: She exhibits no swelling.       Left ankle: She exhibits no swelling.  Neurological: She is alert and oriented to person, place, and time.  Psychiatric: Mood, memory, affect and judgment normal.  Nursing note and vitals reviewed.    Assessment &  Plan  1. Bipolar disorder, in full remission, most recent episode depressed (HCC) Stable, continue on Paxil - PARoxetine (PAXIL) 40 MG tablet; Take 1 tablet (40 mg total) by mouth daily.  Dispense: 90 tablet; Refill: 0  2. Essential hypertension Stable on present antihypertensive treatment - lisinopril-hydrochlorothiazide (ZESTORETIC) 20-12.5 MG tablet; Take 1 tablet by mouth daily.  Dispense: 90 tablet; Refill: 0  3. Insomnia, unspecified type Responsive to treatment, patient takes long afternoon naps, overall able to sleep at least 8 hours daily - traZODone (DESYREL) 50 MG tablet; Take 1 tablet (50 mg total) by mouth at bedtime.  Dispense: 90 tablet; Refill: 0  4. Sensation of fullness  in both ears Removal of earwax, referral to ENT for further management - Ear Lavage - Ambulatory referral to ENT  Titania Gault Asad A. Faylene KurtzShah [facility] 07/08/2017 1:56 PM

## 2017-07-20 DIAGNOSIS — F17200 Nicotine dependence, unspecified, uncomplicated: Secondary | ICD-10-CM | POA: Diagnosis not present

## 2017-07-20 DIAGNOSIS — E782 Mixed hyperlipidemia: Secondary | ICD-10-CM | POA: Diagnosis not present

## 2017-07-20 DIAGNOSIS — Z794 Long term (current) use of insulin: Secondary | ICD-10-CM | POA: Diagnosis not present

## 2017-07-20 DIAGNOSIS — E1165 Type 2 diabetes mellitus with hyperglycemia: Secondary | ICD-10-CM | POA: Diagnosis not present

## 2017-07-30 ENCOUNTER — Other Ambulatory Visit: Payer: Self-pay | Admitting: Otolaryngology

## 2017-07-30 DIAGNOSIS — H9311 Tinnitus, right ear: Secondary | ICD-10-CM

## 2017-08-06 ENCOUNTER — Ambulatory Visit
Admission: RE | Admit: 2017-08-06 | Discharge: 2017-08-06 | Disposition: A | Discharge status: Home / Self Care | Payer: Managed Care, Other (non HMO) | Source: Ambulatory Visit | Attending: Otolaryngology | Admitting: Otolaryngology

## 2017-08-06 DIAGNOSIS — R9082 White matter disease, unspecified: Secondary | ICD-10-CM | POA: Diagnosis not present

## 2017-08-06 DIAGNOSIS — H9190 Unspecified hearing loss, unspecified ear: Secondary | ICD-10-CM | POA: Diagnosis not present

## 2017-08-06 DIAGNOSIS — R42 Dizziness and giddiness: Secondary | ICD-10-CM | POA: Diagnosis not present

## 2017-08-06 DIAGNOSIS — I10 Essential (primary) hypertension: Secondary | ICD-10-CM | POA: Insufficient documentation

## 2017-08-06 DIAGNOSIS — H9311 Tinnitus, right ear: Secondary | ICD-10-CM | POA: Insufficient documentation

## 2017-08-06 DIAGNOSIS — E119 Type 2 diabetes mellitus without complications: Secondary | ICD-10-CM | POA: Insufficient documentation

## 2017-08-06 LAB — POCT I-STAT CREATININE: CREATININE: 0.6 mg/dL (ref 0.44–1.00)

## 2017-08-06 MED ORDER — GADOBENATE DIMEGLUMINE 529 MG/ML IV SOLN
20.0000 mL | Freq: Once | INTRAVENOUS | Status: AC | PRN
  Administered 2017-08-06: 20 mL via INTRAVENOUS

## 2017-08-13 DIAGNOSIS — R42 Dizziness and giddiness: Secondary | ICD-10-CM | POA: Diagnosis not present

## 2017-09-17 DIAGNOSIS — R0683 Snoring: Secondary | ICD-10-CM | POA: Diagnosis not present

## 2017-09-17 DIAGNOSIS — R29898 Other symptoms and signs involving the musculoskeletal system: Secondary | ICD-10-CM | POA: Diagnosis not present

## 2017-09-17 DIAGNOSIS — G20 Parkinson's disease: Secondary | ICD-10-CM | POA: Diagnosis not present

## 2017-09-17 DIAGNOSIS — R2689 Other abnormalities of gait and mobility: Secondary | ICD-10-CM | POA: Diagnosis not present

## 2017-09-17 DIAGNOSIS — I6381 Other cerebral infarction due to occlusion or stenosis of small artery: Secondary | ICD-10-CM | POA: Diagnosis not present

## 2017-09-23 ENCOUNTER — Ambulatory Visit (INDEPENDENT_AMBULATORY_CARE_PROVIDER_SITE_OTHER): Payer: Managed Care, Other (non HMO) | Admitting: Family Medicine

## 2017-09-23 VITALS — BP 136/78 | HR 100 | Temp 98.3°F | Resp 16 | Ht 65.0 in | Wt 247.9 lb

## 2017-09-23 DIAGNOSIS — N760 Acute vaginitis: Secondary | ICD-10-CM | POA: Diagnosis not present

## 2017-09-23 DIAGNOSIS — Z23 Encounter for immunization: Secondary | ICD-10-CM

## 2017-09-23 DIAGNOSIS — J0100 Acute maxillary sinusitis, unspecified: Secondary | ICD-10-CM | POA: Diagnosis not present

## 2017-09-23 MED ORDER — FLUCONAZOLE 150 MG PO TABS: 150.0000 mg | ORAL_TABLET | Freq: Once | ORAL | 0 refills | Status: AC

## 2017-09-23 MED ORDER — AMOXICILLIN-POT CLAVULANATE 875-125 MG PO TABS: 1.0000 | ORAL_TABLET | Freq: Two times a day (BID) | ORAL | 0 refills | Status: DC

## 2017-09-23 NOTE — Progress Notes (Signed)
Name: Debra [name]   MRN: [id number]    DOB: 1961/11/17   Date:09/23/2017       Progress Note  Subjective  Chief Complaint  Chief Complaint  Patient presents with  . Sinus Problem    pressure and green mucus   . Vaginitis    external itching     Sinus Problem  This is a recurrent problem. The maximum temperature recorded prior to her arrival was 101 - 101.9 F. Associated symptoms include chills, sinus pressure and a sore throat. Pertinent negatives include no congestion, coughing or neck pain. Treatments tried: Xyzal for allergies, Catering managerAlka Seltzer.  Vaginal Itching  The patient's primary symptoms include genital itching. The patient's pertinent negatives include no vaginal bleeding or vaginal discharge. This is a recurrent problem. The current episode started in the past 7 days. The problem has been unchanged. She is not pregnant. Associated symptoms include chills and a sore throat. She has tried antifungals (has tried the 7 day Monistat, which did not help) for the symptoms. She is sexually active.     Past Medical History:  Diagnosis Date  . Anxiety   . Arthritis   . Depression   . Diabetes mellitus without complication (HCC)    Type II  . Family history of adverse reaction to anesthesia    Mother - BP drops  . Head injury, closed, with brief LOC (HCC) 2011ish  . Hyperlipidemia   . Hypertension   . Insomnia   . Obesity   . Seasonal allergies   . Tobacco abuse     Past Surgical History:  Procedure Laterality Date  . ABDOMINAL HYSTERECTOMY  2012  . BREAST BIOPSY Right   . CESAREAN SECTION  1992  . CHOLECYSTECTOMY  2013  . LUMBAR LAMINECTOMY/DECOMPRESSION MICRODISCECTOMY Left 09/10/2015   Procedure: Left L4-5 Foraminotomy/Left L5-S1 Diskectomy;  Surgeon: Hilda LiasErnesto Botero, MD;  Location: [facility];  Service: Neurosurgery;  Laterality: Left;  Left L4-5 Foraminotomy/Left L5-S1 Diskectomy    Family History  Problem Relation Age of Onset  . COPD Mother   . Bipolar disorder  Father   . Diabetes Mellitus II Father   . Bipolar disorder Brother   . Diabetes Mellitus II Brother     Social History   Social History  . Marital status: Married    Spouse name: N/A  . Number of children: N/A  . Years of education: N/A   Occupational History  . Not on file.   Social History Main Topics  . Smoking status: Current Every Day Smoker    Packs/day: 0.25    Years: 24.00    Types: Cigarettes  . Smokeless tobacco: Never Used  . Alcohol use No     Comment: occasional- rare  . Drug use: No  . Sexual activity: No   Other Topics Concern  . Not on file   Social History Narrative  . No narrative on file     Current Outpatient Prescriptions:  .  ACCU-CHEK SMARTVIEW test strip, , Disp: , Rfl:  .  Alcohol Swabs (ALCOHOL PREP) PADS, , Disp: , Rfl:  .  aspirin EC 81 MG tablet, Take 81 mg by mouth daily., Disp: , Rfl:  .  calcium carbonate (OSCAL) 1500 (600 Ca) MG TABS tablet, Take by mouth 2 (two) times daily with a meal., Disp: , Rfl:  .  Cholecalciferol 5000 units capsule, Take 5,000 Units by mouth. , Disp: , Rfl:  .  dapagliflozin propanediol (FARXIGA) 10 MG TABS tablet, Take 1 tablet  by mouth every morning., Disp: , Rfl:  .  folic acid (FOLVITE) 400 MCG tablet, Take 800 mcg by mouth daily. , Disp: , Rfl:  .  Insulin Lispro (HUMALOG KWIKPEN) 200 UNIT/ML SOPN, Inject 25 Units into the skin. , Disp: , Rfl:  .  lisinopril-hydrochlorothiazide (ZESTORETIC) 20-12.5 MG tablet, Take 1 tablet by mouth daily., Disp: 90 tablet, Rfl: 0 .  Multiple Vitamins-Minerals (CENTRUM WOMEN) TABS, Take 1 tablet by mouth., Disp: , Rfl:  .  PARoxetine (PAXIL) 40 MG tablet, Take 1 tablet (40 mg total) by mouth daily., Disp: 90 tablet, Rfl: 0 .  traZODone (DESYREL) 50 MG tablet, Take 1 tablet (50 mg total) by mouth at bedtime., Disp: 90 tablet, Rfl: 0 .  TRESIBA FLEXTOUCH 200 UNIT/ML SOPN, Inject 120 Units into the skin at bedtime. , Disp: , Rfl:  .  cephALEXin (KEFLEX) 500 MG capsule, Take  1 capsule (500 mg total) by mouth 3 (three) times daily. (Patient not taking: Reported on 07/08/2017), Disp: 21 capsule, Rfl: 0  Allergies  Allergen Reactions  . Metformin And Related Diarrhea  . Benadryl [Diphenhydramine Hcl] Swelling    blistering  . Canagliflozin Other (See Comments)    Other reaction(s): Other (See Comments) Low bp Low bp Low bp  . Cholesterol Other (See Comments)    meds cause her to pass out  . Depakote Er [Divalproex Sodium Er] Other (See Comments)    seizures  . Dilaudid [Hydromorphone Hcl] Other (See Comments)    seizures  . Diphenhydramine-Zinc Acetate Other (See Comments)  . Haloperidol Other (See Comments)  . Hydromorphone Other (See Comments)  . Neosporin [Neomycin-Bacitracin Zn-Polymyx] Swelling    blistering  . Singulair [Montelukast Sodium] Other (See Comments)    Asthma attacks  . Sitagliptin Other (See Comments)  . Statins Other (See Comments)  . Sulfa Antibiotics Hives  . Victoza [Liraglutide] Other (See Comments) and Nausea And Vomiting    Other reaction(s): Other (See Comments) pancreatitis pancreatitis     Review of Systems  Constitutional: Positive for chills.  HENT: Positive for sinus pressure and sore throat. Negative for congestion.   Respiratory: Negative for cough.   Genitourinary: Negative for vaginal discharge.  Musculoskeletal: Negative for neck pain.     Objective  Vitals:   09/23/17 0916  BP: 136/78  Pulse: 100  Resp: 16  Temp: 98.3 F (36.8 C)  TempSrc: Oral  SpO2: 96%  Weight: 247 lb 14.4 oz (112.4 kg)  Height: 5\' 5"  (1.651 m)    Physical Exam  Constitutional: She is oriented to person, place, and time and well-developed, well-nourished, and in no distress.  HENT:  Head: Normocephalic and atraumatic.  Right Ear: No drainage or swelling.  Left Ear: No drainage or swelling.  Nose: Right sinus exhibits maxillary sinus tenderness and frontal sinus tenderness. Left sinus exhibits maxillary sinus tenderness  and frontal sinus tenderness.  Mouth/Throat: Posterior oropharyngeal erythema present.  Small amount of cerumen in both ear canals Mucosal inflammation, turbinate hypertrophy  Neck: Trachea normal.  Cardiovascular: Normal rate, regular rhythm, S1 normal, S2 normal and normal heart sounds.   No murmur heard. Pulmonary/Chest: Effort normal and breath sounds normal. She has no wheezes.  Neurological: She is alert and oriented to person, place, and time.  Nursing note and vitals reviewed.    Recent Results (from the past 2160 hour(s))  I-STAT creatinine     Status: None   Collection Time: 08/06/17  2:26 PM  Result Value Ref Range   Creatinine, Ser 0.60  0.44 - 1.00 mg/dL     Assessment & Plan  1. Needs flu shot  - Flu Vaccine QUAD 6+ mos PF IM (Fluarix Quad PF)  2. Acute non-recurrent maxillary sinusitis By history and exam, start on 10 day course of Amoxicillin. - amoxicillin-clavulanate (AUGMENTIN) 875-125 MG tablet; Take 1 tablet by mouth 2 (two) times daily.  Dispense: 20 tablet; Refill: 0  3. Acute vaginitis Hx of vaginitis, start on Diflucan. - fluconazole (DIFLUCAN) 150 MG tablet; Take 1 tablet (150 mg total) by mouth once.  Dispense: 1 tablet; Refill: 0   Daleyza Gadomski Asad [facility] 09/23/2017 9:49 AM

## 2017-09-29 ENCOUNTER — Ambulatory Visit (INDEPENDENT_AMBULATORY_CARE_PROVIDER_SITE_OTHER): Payer: Managed Care, Other (non HMO)

## 2017-09-29 VITALS — BP 130/74 | HR 88 | Temp 98.1°F | Resp 20 | Ht 65.0 in | Wt 250.2 lb

## 2017-09-29 DIAGNOSIS — Z23 Encounter for immunization: Secondary | ICD-10-CM | POA: Diagnosis not present

## 2017-09-29 DIAGNOSIS — Z0000 Encounter for general adult medical examination without abnormal findings: Secondary | ICD-10-CM | POA: Diagnosis not present

## 2017-09-29 NOTE — Progress Notes (Signed)
Subjective:   Debra [name] is a 56 y.o. female who presents for Medicare Annual (Subsequent) preventive examination.  Review of Systems:  N/A Cardiac Risk Factors include: diabetes mellitus;hypertension;obesity (BMI >30kg/m2);dyslipidemia;sedentary lifestyle     Objective:     Vitals: BP 130/74 (BP Location: Left Arm, Patient Position: Sitting, Cuff Size: Large)   Pulse 88   Temp 98.1 F (36.7 C) (Oral)   Resp 20   Ht 5\' 5"  (1.651 m)   Wt 250 lb 3.2 oz (113.5 kg)   BMI 41.64 kg/m   Body mass index is 41.64 kg/m.   Tobacco History  Smoking Status  . Current Every Day Smoker  . Packs/day: 0.50  . Years: 24.00  . Types: Cigarettes  Smokeless Tobacco  . Never Used     Ready to quit: No Counseling given: No   Past Medical History:  Diagnosis Date  . Anxiety   . Arthritis   . Depression   . Diabetes mellitus without complication (HCC)    Type II  . Family history of adverse reaction to anesthesia    Mother - BP drops  . Head injury, closed, with brief LOC (HCC) 2011ish  . Hyperlipidemia   . Hypertension   . Insomnia   . Obesity   . Parkinson's disease (HCC)   . Seasonal allergies   . Tobacco abuse    Past Surgical History:  Procedure Laterality Date  . ABDOMINAL HYSTERECTOMY  2012  . BREAST BIOPSY Right   . CESAREAN SECTION  1992  . CHOLECYSTECTOMY  2013  . LUMBAR LAMINECTOMY/DECOMPRESSION MICRODISCECTOMY Left 09/10/2015   Procedure: Left L4-5 Foraminotomy/Left L5-S1 Diskectomy;  Surgeon: Hilda Lias, MD;  Location: [facility];  Service: Neurosurgery;  Laterality: Left;  Left L4-5 Foraminotomy/Left L5-S1 Diskectomy   Family History  Problem Relation Age of Onset  . COPD Mother   . Bipolar disorder Father   . Diabetes Mellitus II Father   . Bipolar disorder Brother   . Diabetes Mellitus II Brother   . Bipolar disorder Brother    History  Sexual Activity  . Sexual activity: No    Outpatient Encounter Prescriptions as of 09/29/2017    Medication Sig  . ACCU-CHEK SMARTVIEW test strip   . Alcohol Swabs (ALCOHOL PREP) PADS   . aspirin EC 81 MG tablet Take 81 mg by mouth daily.  . calcium carbonate (OSCAL) 1500 (600 Ca) MG TABS tablet Take by mouth 2 (two) times daily with a meal.  . Cholecalciferol 5000 units capsule Take 5,000 Units by mouth.   . dapagliflozin propanediol (FARXIGA) 10 MG TABS tablet Take 1 tablet by mouth every morning.  . folic acid (FOLVITE) 400 MCG tablet Take 800 mcg by mouth daily.   . Insulin Lispro (HUMALOG KWIKPEN) 200 UNIT/ML SOPN Inject 25 Units into the skin.   [name] lisinopril-hydrochlorothiazide (ZESTORETIC) 20-12.5 MG tablet Take 1 tablet by mouth daily.  . Multiple Vitamins-Minerals (CENTRUM WOMEN) TABS Take 1 tablet by mouth.  [name] PARoxetine (PAXIL) 40 MG tablet Take 1 tablet (40 mg total) by mouth daily.  . TRESIBA FLEXTOUCH 200 UNIT/ML SOPN Inject 120 Units into the skin at bedtime.   . traZODone (DESYREL) 50 MG tablet Take 1 tablet (50 mg total) by mouth at bedtime. (Patient not taking: Reported on 09/29/2017)  . [DISCONTINUED] amoxicillin-clavulanate (AUGMENTIN) 875-125 MG tablet Take 1 tablet by mouth 2 (two) times daily.  . [DISCONTINUED] cephALEXin (KEFLEX) 500 MG capsule Take 1 capsule (500 mg total) by mouth 3 (three) times  daily. (Patient not taking: Reported on 07/08/2017)   No facility-administered encounter medications on file as of 09/29/2017.     Activities of Daily Living In your present state of health, do you have any difficulty performing the following activities: 09/29/2017 09/23/2017  Hearing? Y N  Comment ringing R ear. Sees [facility] -  Vision? N Y  Comment - -  Difficulty concentrating or making decisions? Y N  Comment occasional memory loss -  Walking or climbing stairs? Y N  Comment hip pain -  Dressing or bathing? N N  Doing errands, shopping? N N  Preparing Food and eating ? N -  Using the Toilet? N -  In the past six months, have you accidently leaked  urine? Y -  Comment stress incontinence -  Do you have problems with loss of bowel control? N -  Managing your Medications? N -  Managing your Finances? N -  Housekeeping or managing your Housekeeping? N -  Some recent data might be hidden    Patient Care Team: Ellyn Hack, MD as PCP - General (Family Medicine) Tedd Sias Marlana Salvage, MD as Physician Assistant (Internal Medicine) Lonell Face, MD as Consulting Physician (Neurology)    Assessment:     Exercise Activities and Dietary recommendations Current Exercise Habits: The patient does not participate in regular exercise at present, Exercise limited by: orthopedic condition(s) (hip pain)  Goals    . Increase water intake          Starting 09/21/16, I will continue to drink 8 or more glasses of water.    . Reduce portion size          Recommend to eat 3 small healthy meals per day and at least 2 small healthy snacks per day      Fall Risk Fall Risk  09/29/2017 09/23/2017 07/08/2017 06/07/2017 04/20/2017  Falls in the past year? Yes No No No No  Number falls in past yr: 2 or more - - - -  Injury with Fall? No - - - -  Comment - - - - -  Risk Factor Category  High Fall Risk - - - -  Comment recently diagnosed with Parkinson's - - - -  Risk for fall due to : Impaired balance/gait - - - -  Risk for fall due to: Comment r/t recent diagnosis of Parkinson's - - - -  Follow up Education provided;Falls prevention discussed - - - -   Depression Screen PHQ 2/9 Scores 09/29/2017 09/23/2017 07/08/2017 06/07/2017  PHQ - 2 Score 0 0 0 0     Cognitive Function MMSE - Mini Mental State Exam 09/21/2016  Orientation to time 5  Orientation to Place 5  Registration 3  Attention/ Calculation 5  Recall 3  Language- name 2 objects 2  Language- repeat 1  Language- follow 3 step command 3  Language- read & follow direction 0  Write a sentence 0  Copy design 0  Total score 27     6CIT Screen 09/29/2017  What Year? 0 points  What month?  0 points  What time? 0 points  Count back from 20 0 points  Months in reverse 0 points  Repeat phrase 0 points  Total Score 0    Immunization History  Administered Date(s) Administered  . H1N1 10/16/2008  . Hepatitis A, Adult 09/12/2002  . Hepatitis B 12/20/2014, 01/29/2015  . Hepatitis B, adult 12/20/2014, 01/29/2015, 07/22/2015  . Influenza Split 09/28/2002, 10/08/2005, 09/09/2006, 10/21/2007, 09/23/2009  .  Influenza, Seasonal, Injecte, Preservative Fre 09/03/2014  . Influenza,inj,Quad PF,6+ Mos 08/28/2015, 09/21/2016, 09/29/2017  . Influenza-Unspecified 10/23/2010, 11/16/2011  . Pneumococcal Polysaccharide-23 12/19/2003, 01/16/2010, 07/02/2014  . Tdap 06/21/2006, 07/02/2014   Screening Tests Health Maintenance  Topic Date Due  . COLONOSCOPY  11/03/2012  . OPHTHALMOLOGY EXAM  10/07/2016  . FOOT EXAM  07/07/2017  . DEXA SCAN  10/13/2017 (Originally [birth year])  . PAP SMEAR  09/21/2026 (Originally [birth year]04[birth year]1983)  . HEMOGLOBIN A1C  10/07/2017  . MAMMOGRAM  02/26/2019  . PNEUMOCOCCAL POLYSACCHARIDE VACCINE (2) 07/03/2019  . TETANUS/TDAP  07/02/2024  . INFLUENZA VACCINE  Completed  . Hepatitis C Screening  Completed  . HIV Screening  Completed      Plan:    I have personally reviewed and addressed the Medicare Annual Wellness questionnaire and have noted the following in the patient's chart:  A. Medical and social history B. Use of alcohol, tobacco or illicit drugs  C. Current medications and supplements D. Functional ability and status E.  Nutritional status F.  Physical activity G. Advance directives and Code Status: Declined offer to receive paperwork today for completion. H. List of other physicians I.  Hospitalizations, surgeries, and ER visits in previous 12 months J.  Vitals K. Screenings such as hearing and vision if needed, cognitive and depression L. Referrals and appointments - none  In addition, I have reviewed and discussed with patient certain preventive  protocols, quality metrics, and best practice recommendations. A written personalized care plan for preventive services as well as general preventive health recommendations were provided to patient.  See attached scanned questionnaire for additional information.   Signed,  Deon PillingAmmie Octavius Shin, LPN Nurse Health Advisor   Recommendations for Immunizations per CDC guidelines:  Vaccinations: Influenza vaccine: Allergy to Neomycin/Bacitracin - swelling. Unable to receive vaccine on 09/23/17 due to fever. Asymptomatic today. Denies having past allergic reactions to flu vaccine. Discussed further with Dr. Sherryll BurgerShah. Verbal order to administer vaccine today. Pneumococcal vaccine: Due after age 55 Tdap vaccine: Up to date Shingles vaccine: Declined   Recommendations for Health Maintenance Screenings:  Screenings: Colonoscopy: Cologuard ordered but has not received the package. Provided pt with contact number to call company to follow up Mammogram: Completed 02/25/17. Results can be found in chart. Repeat screening mammogram in one year Bone Density: Declined.  HIV Screening: Completed 09/21/16 Hep C Screening: Completed 09/21/16 Recommended yearly ophthalmology/optometry visit for glaucoma screening and checkup Recommended yearly dental visit for hygiene and checkup

## 2017-09-29 NOTE — Patient Instructions (Signed)
Debra [name] , Thank you for taking time to come for your Medicare Wellness Visit. I appreciate your ongoing commitment to your health goals. Please review the following plan we discussed and let me know if I can assist you in the future.   Screening recommendations/referrals: Colonoscopy: Colonoscopy: Cologuard ordered but has not received the package. Please call the number on the business card I provided you with today to follow up on the status of your package Mammogram: Completed 02/25/17. Repeat mammogram in one year Bone Density: Declined Recommended yearly ophthalmology/optometry visit for glaucoma screening and checkup Recommended yearly dental visit for hygiene and checkup  Vaccinations: Influenza vaccine: Given today Pneumococcal vaccine: Due after age 1 Tdap vaccine: Up to date Shingles vaccine: Declined    Advanced directives: Advance directive discussed with you today. Even though you declined this today please call our office should you change your mind and we can give you the proper paperwork for you to fill out.  Conditions/risks identified: Fall risk prevention discussed; Recommend to eat 3 small healthy meals per day and at least 2 small healthy snacks per day  Next appointment: Scheduled to see Dr. Manuella Ghazi on 10/13/17 @ 11am. Follow up in one year for annual wellness exam.  Preventive Care 40-64 Years, Female Preventive care refers to lifestyle choices and visits with your health care provider that can promote health and wellness. What does preventive care include?  A yearly physical exam. This is also called an annual well check.  Dental exams once or twice a year.  Routine eye exams. Ask your health care provider how often you should have your eyes checked.  Personal lifestyle choices, including:  Daily care of your teeth and gums.  Regular physical activity.  Eating a healthy diet.  Avoiding tobacco and drug use.  Limiting alcohol use.  Practicing safe  sex.  Taking low-dose aspirin daily starting at age 3.  Taking vitamin and mineral supplements as recommended by your health care provider. What happens during an annual well check? The services and screenings done by your health care provider during your annual well check will depend on your age, overall health, lifestyle risk factors, and family history of disease. Counseling  Your health care provider may ask you questions about your:  Alcohol use.  Tobacco use.  Drug use.  Emotional well-being.  Home and relationship well-being.  Sexual activity.  Eating habits.  Work and work Statistician.  Method of birth control.  Menstrual cycle.  Pregnancy history. Screening  You may have the following tests or measurements:  Height, weight, and BMI.  Blood pressure.  Lipid and cholesterol levels. These may be checked every 5 years, or more frequently if you are over 61 years old.  Skin check.  Lung cancer screening. You may have this screening every year starting at age 34 if you have a 30-pack-year history of smoking and currently smoke or have quit within the past 15 years.  Fecal occult blood test (FOBT) of the stool. You may have this test every year starting at age 68.  Flexible sigmoidoscopy or colonoscopy. You may have a sigmoidoscopy every 5 years or a colonoscopy every 10 years starting at age 66.  Hepatitis C blood test.  Hepatitis B blood test.  Sexually transmitted disease (STD) testing.  Diabetes screening. This is done by checking your blood sugar (glucose) after you have not eaten for a while (fasting). You may have this done every 1-3 years.  Mammogram. This may be done every 1-2  years. Talk to your health care provider about when you should start having regular mammograms. This may depend on whether you have a family history of breast cancer.  BRCA-related cancer screening. This may be done if you have a family history of breast, ovarian, tubal, or  peritoneal cancers.  Pelvic exam and Pap test. This may be done every 3 years starting at age 17. Starting at age 38, this may be done every 5 years if you have a Pap test in combination with an HPV test.  Bone density scan. This is done to screen for osteoporosis. You may have this scan if you are at high risk for osteoporosis. Discuss your test results, treatment options, and if necessary, the need for more tests with your health care provider. Vaccines  Your health care provider may recommend certain vaccines, such as:  Influenza vaccine. This is recommended every year.  Tetanus, diphtheria, and acellular pertussis (Tdap, Td) vaccine. You may need a Td booster every 10 years.  Zoster vaccine. You may need this after age 62.  Pneumococcal 13-valent conjugate (PCV13) vaccine. You may need this if you have certain conditions and were not previously vaccinated.  Pneumococcal polysaccharide (PPSV23) vaccine. You may need one or two doses if you smoke cigarettes or if you have certain conditions. Talk to your health care provider about which screenings and vaccines you need and how often you need them. This information is not intended to replace advice given to you by your health care provider. Make sure you discuss any questions you have with your health care provider. Document Released: 12/20/2015 Document Revised: 08/12/2016 Document Reviewed: 09/24/2015 Elsevier Interactive Patient Education  2017 [employer] Prevention in the Home Falls can cause injuries. They can happen to people of all ages. There are many things you can do to make your home safe and to help prevent falls. What can I do on the outside of my home?  Regularly fix the edges of walkways and driveways and fix any cracks.  Remove anything that might make you trip as you walk through a door, such as a raised step or threshold.  Trim any bushes or trees on the path to your home.  Use bright outdoor  lighting.  Clear any walking paths of anything that might make someone trip, such as rocks or tools.  Regularly check to see if handrails are loose or broken. Make sure that both sides of any steps have handrails.  Any raised decks and porches should have guardrails on the edges.  Have any leaves, snow, or ice cleared regularly.  Use sand or salt on walking paths during winter.  Clean up any spills in your garage right away. This includes oil or grease spills. What can I do in the bathroom?  Use night lights.  Install grab bars by the toilet and in the tub and shower. Do not use towel bars as grab bars.  Use non-skid mats or decals in the tub or shower.  If you need to sit down in the shower, use a plastic, non-slip stool.  Keep the floor dry. Clean up any water that spills on the floor as soon as it happens.  Remove soap buildup in the tub or shower regularly.  Attach bath mats securely with double-sided non-slip rug tape.  Do not have throw rugs and other things on the floor that can make you trip. What can I do in the bedroom?  Use night lights.  Make sure  that you have a light by your bed that is easy to reach.  Do not use any sheets or blankets that are too big for your bed. They should not hang down onto the floor.  Have a firm chair that has side arms. You can use this for support while you get dressed.  Do not have throw rugs and other things on the floor that can make you trip. What can I do in the kitchen?  Clean up any spills right away.  Avoid walking on wet floors.  Keep items that you use a lot in easy-to-reach places.  If you need to reach something above you, use a strong step stool that has a grab bar.  Keep electrical cords out of the way.  Do not use floor polish or wax that makes floors slippery. If you must use wax, use non-skid floor wax.  Do not have throw rugs and other things on the floor that can make you trip. What can I do with my  stairs?  Do not leave any items on the stairs.  Make sure that there are handrails on both sides of the stairs and use them. Fix handrails that are broken or loose. Make sure that handrails are as long as the stairways.  Check any carpeting to make sure that it is firmly attached to the stairs. Fix any carpet that is loose or worn.  Avoid having throw rugs at the top or bottom of the stairs. If you do have throw rugs, attach them to the floor with carpet tape.  Make sure that you have a light switch at the top of the stairs and the bottom of the stairs. If you do not have them, ask someone to add them for you. What else can I do to help prevent falls?  Wear shoes that:  Do not have high heels.  Have rubber bottoms.  Are comfortable and fit you well.  Are closed at the toe. Do not wear sandals.  If you use a stepladder:  Make sure that it is fully opened. Do not climb a closed stepladder.  Make sure that both sides of the stepladder are locked into place.  Ask someone to hold it for you, if possible.  Clearly mark and make sure that you can see:  Any grab bars or handrails.  First and last steps.  Where the edge of each step is.  Use tools that help you move around (mobility aids) if they are needed. These include:  Canes.  Walkers.  Scooters.  Crutches.  Turn on the lights when you go into a dark area. Replace any light bulbs as soon as they burn out.  Set up your furniture so you have a clear path. Avoid moving your furniture around.  If any of your floors are uneven, fix them.  If there are any pets around you, be aware of where they are.  Review your medicines with your doctor. Some medicines can make you feel dizzy. This can increase your chance of falling. Ask your doctor what other things that you can do to help prevent falls. This information is not intended to replace advice given to you by your health care provider. Make sure you discuss any  questions you have with your health care provider. Document Released: 09/19/2009 Document Revised: 04/30/2016 Document Reviewed: 12/28/2014 Elsevier Interactive Patient Education  2017 [employer].

## 2017-10-08 ENCOUNTER — Ambulatory Visit: Payer: Managed Care, Other (non HMO) | Attending: Neurology

## 2017-10-08 DIAGNOSIS — R2681 Unsteadiness on feet: Secondary | ICD-10-CM | POA: Diagnosis not present

## 2017-10-08 DIAGNOSIS — R42 Dizziness and giddiness: Secondary | ICD-10-CM | POA: Diagnosis not present

## 2017-10-08 NOTE — Therapy (Addendum)
[place] [facility] MAIN [facility] SERVICES [address] [place], [place], [address] Phone: [phone]   Fax:  [phone]  Physical Therapy Evaluation  Patient Details  Name: Debra [name] MRN: [id number] Date of Birth: 18-Jul-1961 Referring Provider: Dr. Sherryll Burger   Encounter Date: 10/08/2017  PT End of Session - 10/10/17 2015    Visit Number  1    Number of Visits  9    Date for PT Re-Evaluation  12/03/17    Authorization Type  no g codes    PT Start Time  0915    PT Stop Time  1005    PT Time Calculation (min)  50 min    Activity Tolerance  Patient tolerated treatment well    Behavior During Therapy  [facility] for tasks assessed/performed       Past Medical History:  Diagnosis Date  . Anxiety   . Arthritis   . Depression   . Diabetes mellitus without complication (HCC)    Type II  . Family history of adverse reaction to anesthesia    Mother - BP drops  . Head injury, closed, with brief LOC (HCC) 2011ish  . Hyperlipidemia   . Hypertension   . Insomnia   . Obesity   . Parkinson's disease (HCC)   . Seasonal allergies   . Tobacco abuse     Past Surgical History:  Procedure Laterality Date  . ABDOMINAL HYSTERECTOMY  05-12-11  . BREAST BIOPSY Right   . CESAREAN SECTION  1992  . CHOLECYSTECTOMY  May 11, 2012    There were no vitals filed for this visit.   Subjective Assessment - 10/10/17 May 11, 2012    Subjective  Dizziness and unsteadiness    Pertinent History  Large parts of history borrowed from neurology note. Entire note read and confirmed with patient. Patient states she has been having episodes of lightheadedness, imbalance, and unsteadiness since around June 2018. She describes this as becoming dizzy then leaning to the right. This is worse with closing her eyes or being in the shower. She also describes this as a lightheadedness and it can occur when sitting. She has around three episodes weekly and they last around thirty seconds at a time. Symptoms are  exacerbated by showering, turning her head, bending over, standing stationary, and standing up. Symptoms can occur spontaneously. She also has imbalanced gait. She states she also had ear pain and ringing in her right ear but was treated for an ear infection a couple weeks ago and that improved. No associated headache. She reports some RLE shakiness. She has been evaluated by Dr Andee Poles at [facility]. VNG showed bilateral caloric weakness. She has had a negative cardiac evaluation with Holter monitor. She had an MRI and per Dr. Margaretmary Eddy note it showed white matter changes in the pons, old lacnar infarct in the left putamen/globus pallidus. Dr. Sherryll Burger has diagnosed her with imbalance and some features of parkinsonism such as jaw tremor and cog wheeling. She has been on disability since May 11, 2005 for PTSD. She takes Paxil and has taken this for some time. Patient has history of obstructive sleep apnea and at that time she weighed over 300 lbs. She states after weight loss she had another polysomnography which was negative. Pt has a history of back surgery approximately 2 years ago (unsure what type of surgery).       Diagnostic tests  MRI: see history    Patient Stated Goals  Decrease dizziness    Currently in Pain?  No/denies  history of chronic back pain, unrelated to current episode   history of chronic back pain, unrelated to current episode         [facility] PT Assessment - 10/10/17 0001      Assessment   Medical Diagnosis  Imbalance    Referring Provider  Dr. Sherryll Burger    Onset Date/Surgical Date  05/07/17 Approximate   Approximate   Next MD Visit  December 2018    Prior Therapy  No      Precautions   Precautions  Fall      Restrictions   Weight Bearing Restrictions  No      Balance Screen   Has the patient fallen in the past 6 months  Yes    How many times?  3    Has the patient had a decrease in activity level because of a fear of falling?   Yes    Is the patient reluctant to leave their home  because of a fear of falling?   Yes      Home Environment   Living Environment  Private residence    Living Arrangements  Spouse/significant other      Prior Function   Level of Independence  Independent      Cognition   Overall Cognitive Status  Within Functional Limits for tasks assessed      Observation/Other Assessments   Other Surveys   Other Surveys    Activities of Balance Confidence Scale (ABC Scale)   53.75%    Dizziness Handicap Inventory (DHI)   56/100      Standardized Balance Assessment   Standardized Balance Assessment  Five Times Sit to Stand;Dynamic Gait Index    Five times sit to stand comments   22.8s      Dynamic Gait Index   Level Surface  Normal    Change in Gait Speed  Mild Impairment    Gait with Horizontal Head Turns  Moderate Impairment    Gait with Vertical Head Turns  Moderate Impairment    Gait and Pivot Turn  Normal    Step Over Obstacle  Moderate Impairment    Step Around Obstacles  Normal    Steps  Mild Impairment    Total Score  16        VESTIBULAR AND BALANCE EVALUATION  Onset Date: June 2018  HISTORY:  Subjective history of current problem: Large parts of history borrowed from neurology note. Entire note read and confirmed with patient. Patient states she has been having episodes of lightheadedness, imbalance, and unsteadiness since around June 2018. She describes this as becoming dizzy then leaning to the right. This is worse with closing her eyes or being in the shower. She also describes this as a lightheadedness and it can occur when sitting. She has around three episodes weekly and they last around thirty seconds at a time. Symptoms are exacerbated by showering, turning her head, bending over, standing stationary, and standing up. Symptoms can occur spontaneously. She also has imbalance with walking. She states she also had ear pain and ringing in her right ear but was treated for an ear infection a couple weeks ago and that improved. She  continues to have very minor tinnitus in R ear. No associated headache or migraines. She reports some RLE shakiness. She has been evaluated by Dr Andee Poles at [facility]. VNG showed bilateral caloric weakness. She has had a negative cardiac evaluation with Holter monitor. She had an MRI and per Dr. Margaretmary Eddy note it  showed white matter changes in the pons, old lacnar infarct in the left putamen/globus pallidus. Dr. Sherryll Burger has diagnosed her with imbalance and some features of parkinsonism such as jaw tremor and cog wheeling. She has been on disability since 2006 for PTSD. She takes Paxil and has taken this for some time. Patient has history of obstructive sleep apnea and at that time she weighed over 300 lbs. She states after weight loss she had another polysomnography which was negative. Pt has a history of back surgery approximately 2 years ago (unsure what type of surgery).     Description of dizziness: lightheadedness (no syncope), unsteadiness, denies vertigo Frequency: 3x/wk Duration: 30 seconds Symptom nature: (motion provoked, positional, spontaneous, constant, variable, intermittent): motion provoked, sometimes spontaneous, sometimes positional.  Provocative Factors: showering, turning her head, bending over, standing stationary, and standing up. Symptoms can occur spontaneously  Easing Factors: "rest until it passes"  Progression of symptoms: (better, worse, no change since onset) no change History of similar episodes: no  Falls (yes/no): yes Number of falls in past 6 months: 3   Prior Functional Level: On disability since 2006. Independent with ADLs. Family assists with IADLs  Auditory complaints (tinnitus, pain, drainage): She states she also had ear pain and ringing in her right ear but was treated for an ear infection a couple weeks ago and that improved. Still has minor R ear ringing Vision (last eye exam, diplopia, recent changes): Denies  Current Symptoms: Denies dysarthria, dysphagia,  drop attacks, bowel and bladder changes, recent weight loss/gain.  Review of systems negative for red flags.     EXAMINATION  POSTURE: Forward head and rounded shoulders  NEUROLOGICAL SCREEN: (2+ unless otherwise noted.) N=normal  Ab=abnormal: Deferred dermatome/myotome/reflex screening as pt just seen by neurology      COORDINATION: Finger to Nose: Dysmetric bilateral  MUSCULOSKELETAL SCREEN: Cervical Spine ROM: mild dizziness, mild loss in all planes   ROM: WFL, no focal deficits  MMT: 4-/5 R elbow extension, decreased R grip strength (s/p chronic CVA), bilateral LE strength grossly 4 to 4+/5 and symmetrical  Gait: Scanning of visual environment with gait is: decreased spontaneous head turning   Balance: DGI: 16/24, single leg balance >10s bilaterally   POSTURAL CONTROL TESTS:   Clinical Test of Sensory Interaction for Balance    (CTSIB):  CONDITION TIME STRATEGY SWAY  Eyes open, firm surface 30 seconds ankle 1+  Eyes closed, firm surface 30 seconds ankle 2+  Eyes open, foam surface 30 seconds ankle 1+  Eyes closed, foam surface 30 seconds ankle 3+    OCULOMOTOR / VESTIBULAR TESTING:  Oculomotor Exam- Room Light  Normal Abnormal Comments  Ocular Alignment N    Ocular ROM N    Spontaneous Nystagmus N    End-Gaze Nystagmus N    Smooth Pursuit  A Saccadic  Saccades N    VOR  A Dizziness, no blurring  VOR Cancellation  A Dizziness and blurring  Left Head Thrust  A Corrective saccades noted with multiple tests bilaterally  Right Head Thrust  A   Head Shaking Nystagmus N    Static Acuity     Dynamic Acuity      BPPV TESTS:  Symptoms Duration Intensity Nystagmus  L Dix-Hallpike Negative   Negative  R Dix-Hallpike Negative   Negative  L Head Roll      R Head Roll      L Sidelying Test      R Sidelying Test  FUNCTIONAL OUTCOME MEASURES:  Results Comments  DHI 56/100 Moderate perception of handicap; in need of intervention  ABC Scale 53.75% Falls  risk; in need of intervention  DGI 16/24 Falls risk; in need of intervention  10 meter Walking Speed deferred          Objective measurements completed on examination: See above findings.      Performed VOR x 1 horizontal in sitting with patient x 60 seconds. Pt reports reproduction of dizziness but unable to rate severity.        PT Education - 10/10/17 2015    Education provided  Yes    Education Details  HEP and plan of care    Person(s) Educated  Patient    Methods  Explanation    Comprehension  Verbalized understanding       PT Short Term Goals - 10/10/17 2041      PT SHORT TERM GOAL #1   Title  Pt will be independent with HEP in order to improve strength and balance in order to decrease fall risk and improve function at home and work.     Time  4    Period  Weeks    Status  New    Target Date  11/05/17                 PT Long Term Goals - 10/10/17 2042      PT LONG TERM GOAL #1   Title  Pt will decrease DHI score by at least 18 points in order to demonstrate clinically significant reduction in disability    Baseline  10/08/17: 56/100    Time  8    Period  Weeks    Status  New    Target Date  12/03/17      PT LONG TERM GOAL #2   Title  Pt will improve ABC by at least 13% in order to demonstrate clinically significant improvement in balance confidence.    Baseline  10/08/17: 53.75%    Time  8    Period  Weeks    Status  New    Target Date  12/03/17      PT LONG TERM GOAL #3   Title  Pt will improve DGI by at least 3 points in order to demonstrate clinically significant improvement in balance and decreased risk for falls    Baseline  10/08/17    Time  8    Period  Weeks    Status  New    Target Date  12/03/17             Plan - 10/10/17 2037    Clinical Impression Statement  Pt is a pleasant 56 yo referred for difficulty with balance and unsteady gait. Pt with a history of old lacunar infarct in the left putamen/globus pallidus as well  as some features of parkinsonism per neurology. She has also been evaluated by [facility] and VNG showed bilateral caloric weakness. PT evaluation reveals combination of central and peripheral signs including abnormal coordination testing, saccadic smooth pursuits, positive VOR cancellation, positive VOR, and positive VOR thrust bilaterally. She scored 16/24 on DGI and 53.75% on ABC indicating increased falls risk and decreased balance confidence. DHI of 56/100 indicates moderate perception of handicap. Pt will benefit from PT services to address deficits in dizziness, balance, and mobility in order to decrease symptoms and improve function at home.    History and Personal Factors relevant to plan of care:  3 or  more personal factors/comorbidities, 4 or more body systems/activity limitations/participation restrictions     Clinical Presentation  Unstable    Clinical Presentation due to:  highly variable and unpredictable symptoms    Clinical Decision Making  High    Rehab Potential  Fair    PT Frequency  1x / week    PT Duration  8 weeks    PT Treatment/Interventions  ADLs/Self Care Home Management;Aquatic Therapy;Canalith Repostioning;Cryotherapy;Electrical Stimulation;Moist Heat;Iontophoresis 4mg /ml Dexamethasone;Traction;Ultrasound;DME Instruction;Gait training;Stair training;Functional mobility training;Therapeutic activities;Therapeutic exercise;Balance training;Neuromuscular re-education;Patient/family education;Manual techniques;Passive range of motion;Energy conservation;Vestibular    PT Next Visit Plan  Review VOR x 1 horizontal, perform BERG, semitandem head turn progressions    PT Home Exercise Plan  VOR x 1 horizontal in sitting;    Consulted and Agree with Plan of Care  Patient       Patient will benefit from skilled therapeutic intervention in order to improve the following deficits and impairments:  Abnormal gait, Decreased balance, Dizziness  Visit Diagnosis: Dizziness and  giddiness - Plan: PT plan of care cert/re-cert  Unsteadiness on feet - Plan: PT plan of care cert/re-cert    Problem List Patient Active Problem List   Diagnosis Date Noted  . Hyperlipidemia 04/20/2017  . Facial contracture 10/22/2016  . Insomnia 06/15/2016  . Oral candidiasis 12/11/2015  . Urinary tract infection symptoms 12/11/2015  . Need for immunization against influenza 08/28/2015  . Acute low back pain with radicular symptoms, duration less than 6 weeks 08/28/2015  . Need for vaccination 08/21/2015  . Lumbar disc disease with radiculopathy 08/20/2015  . Lumbar herniated disc 08/20/2015  . Uncontrolled diabetes mellitus (HCC) 08/20/2015  . Left-sided low back pain with sciatica 06/03/2015  . Abdominal gas pain 06/03/2015  . Abdominal pain, generalized 06/03/2015  . Bipolar disorder (HCC) 06/03/2015  . Insomnia, persistent 06/03/2015  . BP (high blood pressure) 06/03/2015  . Type 2 diabetes mellitus treated with insulin (HCC) 06/03/2015  . Leg swelling 06/03/2015  . Extreme obesity 06/03/2015  . Compulsive tobacco user syndrome 06/03/2015  . Vaginitis due to Candida 06/03/2015  . Phlebectasia 06/03/2015   Lynnea MaizesJason D Dillyn Menna PT, DPT   Kowen Kluth 10/10/2017, 8:45 PM  [facility] [address] , [place], [address] Phone: [phone]   Fax:  [phone]  Name: Debra [name] MRN: [id number] Date of Birth: 09-11-1961

## 2017-10-13 ENCOUNTER — Encounter: Payer: Managed Care, Other (non HMO) | Admitting: Family Medicine

## 2017-10-15 ENCOUNTER — Encounter: Payer: Self-pay | Admitting: Physical Therapy

## 2017-10-15 ENCOUNTER — Ambulatory Visit: Payer: Managed Care, Other (non HMO)

## 2017-10-15 ENCOUNTER — Other Ambulatory Visit: Payer: Self-pay

## 2017-10-15 VITALS — BP 142/68 | HR 87

## 2017-10-15 DIAGNOSIS — R2681 Unsteadiness on feet: Secondary | ICD-10-CM

## 2017-10-15 DIAGNOSIS — R42 Dizziness and giddiness: Secondary | ICD-10-CM

## 2017-10-15 NOTE — Therapy (Signed)
Debra [name]  Physical Therapy Treatment  Patient Details  Name: Debra [name] MRN: [id number] Date of Birth: 1960-12-24 Referring Provider: Dr. Sherryll BurgerShah   Encounter Date: 10/15/2017  PT End of Session - 10/15/17 1055    Visit Number  2    Number of Visits  9    Date for PT Re-Evaluation  12/03/17    Authorization Type  no g codes    PT Start Time  1055    PT Stop Time  1135    PT Time Calculation (min)  40 min    Activity Tolerance  Patient tolerated treatment well    Behavior During Therapy  [facility] for tasks assessed/performed       Past Medical History:  Diagnosis Date  . Anxiety   . Arthritis   . Depression   . Diabetes mellitus without complication (HCC)    Type II  . Family history of adverse reaction to anesthesia    Mother - BP drops  . Head injury, closed, with brief LOC (HCC) 2011ish  . Hyperlipidemia   . Hypertension   . Insomnia   . Obesity   . Parkinson's disease (HCC)   . Seasonal allergies   . Tobacco abuse     Past Surgical History:  Procedure Laterality Date  . ABDOMINAL HYSTERECTOMY  2012  . BREAST BIOPSY Right   . CESAREAN SECTION  1992  . CHOLECYSTECTOMY  2013    Vitals:   10/15/17 1054  BP: (!) 142/68  Pulse: 87  SpO2: 94%    Subjective Assessment - 10/15/17 1054    Subjective  Pt reports she is doing well on this date. She is having some low back achiness with the cold weather. HEP going well without any questions or concerns.     Pertinent History  Large parts of history borrowed from neurology note. Entire note read and confirmed with patient. Patient states she has been having episodes of lightheadedness, imbalance, and unsteadiness since around June 2018. She describes this as becoming dizzy then leaning to the right. This is worse with closing her eyes or being in the shower. She also describes this as  a lightheadedness and it can occur when sitting. She has around three episodes weekly and they last around thirty seconds at a time. Symptoms are exacerbated by showering, turning her head, bending over, standing stationary, and standing up. Symptoms can occur spontaneously. She also has imbalanced gait. She states she also had ear pain and ringing in her right ear but was treated for an ear infection a couple weeks ago and that improved. No associated headache. She reports some RLE shakiness. She has been evaluated by Dr Andee PolesVaught at [facility]. VNG showed bilateral caloric weakness. She has had a negative cardiac evaluation with Holter monitor. She had an MRI and per Dr. 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 Dr. Sherryll BurgerShah has diagnosed her with imbalance and some features of parkinsonism such as jaw tremor and cog wheeling. She has been on disability since 2006 for PTSD. She takes Paxil and has taken this for some time. Patient has history of obstructive sleep apnea and at that time she weighed over 300 lbs. She states after weight loss she had another polysomnography which was negative. Pt has a history of back surgery approximately 2 years ago (  unsure what type of surgery).       Diagnostic tests  MRI: see history    Patient Stated Goals  Decrease dizziness    Currently in Pain?  Yes    Pain Score  5     Pain Location  Back    Pain Orientation  Lower;Medial    Pain Descriptors / Indicators  Aching    Pain Type  Chronic pain    Pain Onset  More than a month ago    Pain Frequency  Intermittent         [facility] PT Assessment - 10/15/17 1105      Standardized Balance Assessment   Standardized Balance Assessment  Berg Balance Test      Berg Balance Test   Sit to Stand  Able to stand without using hands and stabilize independently    Standing Unsupported  Able to stand safely 2 minutes    Sitting with Back Unsupported but Feet  Supported on Floor or Stool  Able to sit safely and securely 2 minutes    Stand to Sit  Sits safely with minimal use of hands    Transfers  Able to transfer safely, minor use of hands    Standing Unsupported with Eyes Closed  Able to stand 10 seconds safely    Standing Ubsupported with Feet Together  Able to place feet together independently and stand 1 minute safely    From Standing, Reach Forward with Outstretched Arm  Can reach confidently >25 cm (10")    From Standing Position, Pick up Object from Floor  Able to pick up shoe safely and easily    From Standing Position, Turn to Look Behind Over each Shoulder  Needs supervision when turning    Turn 360 Degrees  Able to turn 360 degrees safely in 4 seconds or less    Standing Unsupported, Alternately Place Feet on Step/Stool  Able to stand independently and safely and complete 8 steps in 20 seconds    Standing Unsupported, One Foot in Front  Able to place foot tandem independently and hold 30 seconds    Standing on One Leg  Able to lift leg independently and hold > 10 seconds    Total Score  53           TREATMENT  Ther-ex  Quantum leg press 105# x 20, 120# x 15, considerably more fatigue noted with 120#, will continue to progress strengthening to find optimal weight for fatigue; Sit to stand without UE support 2 x 10 (issued to HEP)  Neuromuscular Re-education Performed BERG with patient who scored 53/56, most difficulty with standing body turns;  VOR Seated VOR x 1 horizontal x 60s, pt reports that initially at home this was causing dizziness but no longer; Standing VOR x 1 horizontal with feet apart 60s x 2, 5/10 dizziness reported with mild nausea;  Semi-tandem Semi-tandem progressions with feet apart and no UE support alternating forward LE with horizontal head turns 30s x 2 with each LE forward;  Additional HEP issued to patient with handout and verbal education;                  PT Education - 10/15/17  1054    Education provided  Yes    Education Details  HEP progression, exercise form/technique    Person(s) Educated  Patient    Methods  Explanation    Comprehension  Verbalized understanding       PT Short Term Goals - 10/10/17  2041      PT SHORT TERM GOAL #1   Title  Pt will be independent with HEP in order to improve strength and balance in order to decrease fall risk and improve function at home and work.     Time  4    Period  Weeks    Status  New    Target Date  11/05/17        PT Long Term Goals - 10/15/17 1106      PT LONG TERM GOAL #1   Title  Pt will decrease DHI score by at least 18 points in order to demonstrate clinically significant reduction in disability    Baseline  10/08/17: 56/100    Time  8    Period  Weeks    Status  New    Target Date  12/03/17      PT LONG TERM GOAL #2   Title  Pt will improve ABC by at least 13% in order to demonstrate clinically significant improvement in balance confidence.    Baseline  10/08/17: 53.75%    Time  8    Period  Weeks    Status  New    Target Date  12/03/17      PT LONG TERM GOAL #3   Title  Pt will improve DGI by at least 3 points in order to demonstrate clinically significant improvement in balance and decreased risk for falls    Baseline  10/08/17: 16/24    Time  8    Period  Weeks    Status  New    Target Date  12/03/17      PT LONG TERM GOAL #4   Title   Pt will improve BERG by at least 3 points in order to demonstrate clinically significant improvement in balance.      Baseline  10/15/17: 53/56    Time  8    Period  Weeks    Status  New    Target Date  12/03/17            Plan - 10/15/17 1055    Clinical Impression Statement  Pt making good progress with therapy on this date. BERG score of 53/56 with pt losing points on standing body turns. Reports decrease in dizziness with VOR exercise at home after multiple days. Able to reproduce dizziness today by advancing VOR to standing. Encouraged pt to  perform VOR in standing at home with plain background. Added semi tandem head turn progressions and sit to stand strengthening to her HEP. Encouraged to follow-up as scheduled. Pt will benefit from PT services to address deficits in strength, balance, and mobility in order to return to full function at home.    Rehab Potential  Fair    PT Frequency  1x / week    PT Duration  8 weeks    PT Treatment/Interventions  ADLs/Self Care Home Management;Aquatic Therapy;Canalith Repostioning;Cryotherapy;Electrical Stimulation;Moist Heat;Iontophoresis 4mg /ml Dexamethasone;Traction;Ultrasound;DME Instruction;Gait training;Stair training;Functional mobility training;Therapeutic activities;Therapeutic exercise;Balance training;Neuromuscular re-education;Patient/family education;Manual techniques;Passive range of motion;Energy conservation;Vestibular    PT Next Visit Plan  Progress VOR as indicated, progress resistance with leg press, ambulation with head turns    PT Home Exercise Plan  VOR x 1 horizontal in standing with feet apart, sit to stand without UE support 2 x 10, semitandem balane with horizontal head turns    Consulted and Agree with Plan of Care  Patient       Patient will benefit from skilled therapeutic intervention in  order to improve the following deficits and impairments:  Abnormal gait, Decreased balance, Dizziness  Visit Diagnosis: Dizziness and giddiness  Unsteadiness on feet     Problem List Patient Active Problem List   Diagnosis Date Noted  . Hyperlipidemia 04/20/2017  . Facial contracture 10/22/2016  . Insomnia 06/15/2016  . Oral candidiasis 12/11/2015  . Urinary tract infection symptoms 12/11/2015  . Need for immunization against influenza 08/28/2015  . Acute low back pain with radicular symptoms, duration less than 6 weeks 08/28/2015  . Need for vaccination 08/21/2015  . Lumbar disc disease with radiculopathy 08/20/2015  . Lumbar herniated disc 08/20/2015  . Uncontrolled  diabetes mellitus (HCC) 08/20/2015  . Left-sided low back pain with sciatica 06/03/2015  . Abdominal gas pain 06/03/2015  . Abdominal pain, generalized 06/03/2015  . Bipolar disorder (HCC) 06/03/2015  . Insomnia, persistent 06/03/2015  . BP (high blood pressure) 06/03/2015  . Type 2 diabetes mellitus treated with insulin (HCC) 06/03/2015  . Leg swelling 06/03/2015  . Extreme obesity 06/03/2015  . Compulsive tobacco user syndrome 06/03/2015  . Vaginitis due to Candida 06/03/2015  . Phlebectasia 06/03/2015   Lynnea Maizes PT, DPT   Huprich,Jason 10/15/2017, 11:42 AM  [facility] MAIN [facility] SERVICES [address] [place], [place], [address] Phone: [phone]   Fax:  [phone]  Name: Debra [name] MRN: [id number] Date of Birth: 02/05/1961

## 2017-10-20 ENCOUNTER — Ambulatory Visit (INDEPENDENT_AMBULATORY_CARE_PROVIDER_SITE_OTHER): Payer: Managed Care, Other (non HMO) | Admitting: Family Medicine

## 2017-10-20 ENCOUNTER — Encounter: Payer: Self-pay | Admitting: Family Medicine

## 2017-10-20 VITALS — BP 138/62 | HR 100 | Temp 98.2°F | Resp 16 | Wt 250.0 lb

## 2017-10-20 DIAGNOSIS — E7800 Pure hypercholesterolemia, unspecified: Secondary | ICD-10-CM | POA: Diagnosis not present

## 2017-10-20 DIAGNOSIS — Z794 Long term (current) use of insulin: Secondary | ICD-10-CM | POA: Diagnosis not present

## 2017-10-20 DIAGNOSIS — E1165 Type 2 diabetes mellitus with hyperglycemia: Secondary | ICD-10-CM | POA: Diagnosis not present

## 2017-10-20 MED ORDER — PITAVASTATIN CALCIUM 1 MG PO TABS: 1.0000 | ORAL_TABLET | Freq: Every day | ORAL | 0 refills | Status: DC

## 2017-10-20 NOTE — Progress Notes (Signed)
Name: Debra [name]   MRN: [id number]    DOB: 1961-07-31   Date:10/20/2017       Progress Note  Subjective  Chief Complaint  Chief Complaint  Patient presents with  . Medication Refill  . Follow-up    Hyperlipidemia  This is a chronic problem. The problem is controlled. Recent lipid tests were reviewed and are high. Exacerbating diseases include diabetes and obesity. Current antihyperlipidemic treatment includes herbal therapy (has tried statins in the past, experienced syncopaal episode with 3 different statins, has not tried Livalo.). Risk factors for coronary artery disease include diabetes mellitus.    Past Medical History:  Diagnosis Date  . Anxiety   . Arthritis   . Depression   . Diabetes mellitus without complication (HCC)    Type II  . Family history of adverse reaction to anesthesia    Mother - BP drops  . Head injury, closed, with brief LOC (HCC) 2011ish  . Hyperlipidemia   . Hypertension   . Insomnia   . Obesity   . Parkinson's disease (HCC)   . Seasonal allergies   . Tobacco abuse     Past Surgical History:  Procedure Laterality Date  . ABDOMINAL HYSTERECTOMY  2012  . BREAST BIOPSY Right   . CESAREAN SECTION  1992  . CHOLECYSTECTOMY  2013    Family History  Problem Relation Age of Onset  . COPD Mother   . Bipolar disorder Father   . Diabetes Mellitus II Father   . Bipolar disorder Brother   . Diabetes Mellitus II Brother   . Bipolar disorder Brother     Social History   Socioeconomic History  . Marital status: Married    Spouse name: Not on file  . Number of children: Not on file  . Years of education: Not on file  . Highest education level: Not on file  Social Needs  . Financial resource strain: Not on file  . Food insecurity - worry: Not on file  . Food insecurity - inability: Not on file  . Transportation needs - medical: Not on file  . Transportation needs - non-medical: Not on file  Occupational History  . Not on file  Tobacco Use   . Smoking status: Current Every Day Smoker    Packs/day: 0.50    Years: 24.00    Pack years: 12.00    Types: Cigarettes  . Smokeless tobacco: Never Used  Substance and Sexual Activity  . Alcohol use: No    Alcohol/week: 0.0 oz    Comment: occasional- rare  . Drug use: No  . Sexual activity: No  Other Topics Concern  . Not on file  Social History Narrative  . Not on file     Current Outpatient Medications:  .  ACCU-CHEK SMARTVIEW test strip, , Disp: , Rfl:  .  Alcohol Swabs (ALCOHOL PREP) PADS, , Disp: , Rfl:  .  aspirin EC 81 MG tablet, Take 81 mg by mouth daily., Disp: , Rfl:  .  calcium carbonate (OSCAL) 1500 (600 Ca) MG TABS tablet, Take by mouth 2 (two) times daily with a meal., Disp: , Rfl:  .  Cholecalciferol 5000 units capsule, Take 5,000 Units by mouth. , Disp: , Rfl:  .  dapagliflozin propanediol (FARXIGA) 10 MG TABS tablet, Take 1 tablet by mouth every morning., Disp: , Rfl:  .  folic acid (FOLVITE) 400 MCG tablet, Take 800 mcg by mouth daily. , Disp: , Rfl:  .  Insulin Lispro (HUMALOG KWIKPEN) 200  UNIT/ML SOPN, Inject 25 Units into the skin. , Disp: , Rfl:  .  lisinopril-hydrochlorothiazide (ZESTORETIC) 20-12.5 MG tablet, Take 1 tablet by mouth daily., Disp: 90 tablet, Rfl: 0 .  Multiple Vitamins-Minerals (CENTRUM WOMEN) TABS, Take 1 tablet by mouth., Disp: , Rfl:  .  PARoxetine (PAXIL) 40 MG tablet, Take 1 tablet (40 mg total) by mouth daily., Disp: 90 tablet, Rfl: 0 .  TRESIBA FLEXTOUCH 200 UNIT/ML SOPN, Inject 120 Units into the skin at bedtime. , Disp: , Rfl:  .  traZODone (DESYREL) 50 MG tablet, Take 1 tablet (50 mg total) by mouth at bedtime. (Patient not taking: Reported on 10/08/2017), Disp: 90 tablet, Rfl: 0  Allergies  Allergen Reactions  . Metformin And Related Diarrhea  . Benadryl [Diphenhydramine Hcl] Swelling    blistering  . Canagliflozin Other (See Comments)    Other reaction(s): Other (See Comments) Low bp Low bp Low bp  . Cholesterol Other  (See Comments)    meds cause her to pass out  . Depakote Er [Divalproex Sodium Er] Other (See Comments)    seizures  . Dilaudid [Hydromorphone Hcl] Other (See Comments)    seizures  . Diphenhydramine-Zinc Acetate Other (See Comments)  . Haloperidol Other (See Comments)  . Hydromorphone Other (See Comments)  . Neosporin [Neomycin-Bacitracin Zn-Polymyx] Swelling    blistering  . Singulair [Montelukast Sodium] Other (See Comments)    Asthma attacks  . Sitagliptin Other (See Comments)  . Statins Other (See Comments)  . Sulfa Antibiotics Hives  . Victoza [Liraglutide] Other (See Comments) and Nausea And Vomiting    Other reaction(s): Other (See Comments) pancreatitis pancreatitis     ROS  Please see history of present illness for complete discussion of ROS  Objective  Vitals:   10/20/17 1419  BP: 138/62  Pulse: 100  Resp: 16  Temp: 98.2 F (36.8 C)  TempSrc: Oral  SpO2: 94%  Weight: 250 lb (113.4 kg)    Physical Exam  Constitutional: She is oriented to person, place, and time and well-developed, well-nourished, and in no distress.  HENT:  Head: Normocephalic and atraumatic.  Cardiovascular: Normal rate, regular rhythm and normal heart sounds.  No murmur heard. Pulmonary/Chest: Effort normal and breath sounds normal. She has no wheezes.  Neurological: She is alert and oriented to person, place, and time.  Psychiatric: Mood, memory, affect and judgment normal.  Nursing note and vitals reviewed.      Recent Results (from the past 2160 hour(s))  I-STAT creatinine     Status: None   Collection Time: 08/06/17  2:26 PM  Result Value Ref Range   Creatinine, Ser 0.60 0.44 - 1.00 mg/dL     Assessment & Plan  1. Pure hypercholesterolemia Will start on Pitavastatin 1 mg after review of most recent fasting lipid panel, diabetes being managed by endocrinology - Lipid panel - Pitavastatin Calcium 1 MG TABS; Take 1 tablet (1 mg total) at bedtime by mouth.  Dispense: 30  tablet; Refill: 0   Garlen Reinig Asad A. Faylene KurtzShah [facility] 10/20/2017 2:29 PM

## 2017-10-21 LAB — LIPID PANEL
CHOLESTEROL: 243 mg/dL — AB (ref <200)
HDL: 44 mg/dL — AB (ref >50)
LDL CHOLESTEROL (CALC): 172 mg/dL — AB
Non-HDL Cholesterol (Calc): 199 mg/dL (calc) — ABNORMAL HIGH (ref <130)
TRIGLYCERIDES: 130 mg/dL (ref <150)
Total CHOL/HDL Ratio: 5.5 (calc) — ABNORMAL HIGH (ref <5.0)

## 2017-10-22 ENCOUNTER — Encounter: Payer: Managed Care, Other (non HMO) | Admitting: Physical Therapy

## 2017-10-27 DIAGNOSIS — F17200 Nicotine dependence, unspecified, uncomplicated: Secondary | ICD-10-CM | POA: Diagnosis not present

## 2017-10-27 DIAGNOSIS — E1165 Type 2 diabetes mellitus with hyperglycemia: Secondary | ICD-10-CM | POA: Diagnosis not present

## 2017-10-27 DIAGNOSIS — E782 Mixed hyperlipidemia: Secondary | ICD-10-CM | POA: Diagnosis not present

## 2017-10-27 DIAGNOSIS — Z794 Long term (current) use of insulin: Secondary | ICD-10-CM | POA: Diagnosis not present

## 2017-11-04 ENCOUNTER — Other Ambulatory Visit: Payer: Self-pay | Admitting: Family Medicine

## 2017-11-04 ENCOUNTER — Telehealth: Payer: Self-pay

## 2017-11-04 DIAGNOSIS — E7800 Pure hypercholesterolemia, unspecified: Secondary | ICD-10-CM

## 2017-11-04 MED ORDER — PITAVASTATIN CALCIUM 2 MG PO TABS: 1.0000 | ORAL_TABLET | Freq: Every day | ORAL | 0 refills | Status: DC

## 2017-11-04 NOTE — Telephone Encounter (Signed)
-----   Message from Ellyn HackSyed Asad A Shah, MD sent at 10/27/2017  5:15 PM EST ----- Fasting lipid panel: Elevated total cholesterol and LDL cholesterol at 240 and 17 2 mg/dL respectively, normal triglycerides, HDL below normal at 44 mg/dL. She has been intolerant to multiple statins in the past, was started on Livalo 1 mg at bedtime, she should increase Livalo to 2 mg at bedtime and recheck in 3 months

## 2017-11-04 NOTE — Telephone Encounter (Signed)
Left a message to give our office a call back. 

## 2017-11-05 ENCOUNTER — Encounter: Payer: Self-pay | Admitting: Physical Therapy

## 2017-11-05 ENCOUNTER — Ambulatory Visit: Payer: Managed Care, Other (non HMO) | Admitting: Physical Therapy

## 2017-11-05 DIAGNOSIS — R42 Dizziness and giddiness: Secondary | ICD-10-CM

## 2017-11-05 DIAGNOSIS — R2681 Unsteadiness on feet: Secondary | ICD-10-CM | POA: Diagnosis not present

## 2017-11-05 NOTE — Therapy (Signed)
[facility] [address] , [place], [address] Phone: [phone]   Fax:  [phone]  Physical Therapy Treatment  Patient Details  Name: Debra [name] MRN: [id number] Date of Birth: 1961-10-03 Referring Provider: Dr. Sherryll BurgerShah   Encounter Date: 11/05/2017  PT End of Session - 11/05/17 1025    Visit Number  3    Number of Visits  9    Date for PT Re-Evaluation  12/03/17    Authorization Type  no g codes    PT Start Time  1018    PT Stop Time  1103    PT Time Calculation (min)  45 min    Activity Tolerance  Patient tolerated treatment well    Behavior During Therapy  [facility] for tasks assessed/performed       Past Medical History:  Diagnosis Date  . Anxiety   . Arthritis   . Depression   . Diabetes mellitus without complication (HCC)    Type II  . Family history of adverse reaction to anesthesia    Mother - BP drops  . Head injury, closed, with brief LOC (HCC) 2011ish  . Hyperlipidemia   . Hypertension   . Insomnia   . Obesity   . Parkinson's disease (HCC)   . Seasonal allergies   . Tobacco abuse     Past Surgical History:  Procedure Laterality Date  . ABDOMINAL HYSTERECTOMY  2012  . BREAST BIOPSY Right   . CESAREAN SECTION  1992  . CHOLECYSTECTOMY  2013  . LUMBAR LAMINECTOMY/DECOMPRESSION MICRODISCECTOMY Left 09/10/2015   Procedure: Left L4-5 Foraminotomy/Left L5-S1 Diskectomy;  Surgeon: Hilda LiasErnesto Botero, MD;  Location: [facility];  Service: Neurosurgery;  Laterality: Left;  Left L4-5 Foraminotomy/Left L5-S1 Diskectomy    There were no vitals filed for this visit.  Subjective Assessment - 11/05/17 1022    Subjective  Patient reports that she has been doing okay but no changes in her symptoms. Patient states her left leg she is having numbness on the back of her leg. Patient reports that her quad muscles were sore from doing the sit to stand exercises. Patient states she was able to progress to doing 10  repetitions without using her hands to assist.    Currently in Pain?  Yes    Pain Score  6     Pain Location  Back and feet hurt    Pain Orientation  Lower    Pain Descriptors / Indicators  Aching    Pain Type  Chronic pain    Pain Onset  More than a month ago       Neuromuscular Re-education:  VOR X 1 exercise:  Patient performed VOR X 1 horizontal in standing 2 reps of 1 minute each with plain background and 2 reps of 1 minute each with conflicting background. Patient demonstrating good technique.  Patient reports the target is staying in focus and reports 3/10 dizziness as well as increased nausea.  Added conflicting background progression to HEP.   Firm surface: On firm surface, patient performed feet together and semi-tandem progressions with alternating lead leg with and without body turns and horizontal and vertical head turns.  Patient reports mild increase in dizziness with horizontal head turns.   Ambulation with head turns:  Patient performed 5975' trials of forwards ambulation with horizontal and vertical head turns with CGA. Patient reports no significant increase in dizziness symptoms.  Patient performed 2775' of retro ambulation without head turns with Min A  and patient had multiple episodes of stepping out/veering and one episode of loss of balance requiring min A to correct. Patient performs retro ambulation with decreased cadence and step length and patient reports this activity is challenging to her balance and increases her dizziness. Patient required one standing rest break secondary to dizziness with retro ambulation.   Ball tracking : Patient performed static sitting while holding ball while moving ball horizontally and then vertically while tracking ball with head and eyes with verbal cues to turn the head. Repeated this activity but progressed by adding ball tosses horizontally while tracking ball with head and eyes.  Patient reports mild increased dizziness with  this activity.  Discussed that patient could do this activity at home in sitting for HEP.   Therapeutic Exercise:  Leg Press: Patient performed leg press machine #120 pounds 3 sets of 10 reps with verbal cue to not lock knees in extension. Noted fatigue in left quads with this activity.  Patient able to perform demonstrating good technique after cuing.   PT Education - 11/05/17 1445    Education provided  Yes    Education Details  reviewed HEP and added conflicting background and feet together with body turns; provided handouts of slides 1,3,4,5 and 13 from vestibular exercise powerpoint.     Person(s) Educated  Patient    Methods  Explanation;Handout;Demonstration;Verbal cues    Comprehension  Verbalized understanding;Returned demonstration       PT Short Term Goals - 10/10/17 2041      PT SHORT TERM GOAL #1   Title  Pt will be independent with HEP in order to improve strength and balance in order to decrease fall risk and improve function at home and work.     Time  4    Period  Weeks    Status  New    Target Date  11/05/17        PT Long Term Goals - 10/15/17 1106      PT LONG TERM GOAL #1   Title  Pt will decrease DHI score by at least 18 points in order to demonstrate clinically significant reduction in disability    Baseline  10/08/17: 56/100    Time  8    Period  Weeks    Status  New    Target Date  12/03/17      PT LONG TERM GOAL #2   Title  Pt will improve ABC by at least 13% in order to demonstrate clinically significant improvement in balance confidence.    Baseline  10/08/17: 53.75%    Time  8    Period  Weeks    Status  New    Target Date  12/03/17      PT LONG TERM GOAL #3   Title  Pt will improve DGI by at least 3 points in order to demonstrate clinically significant improvement in balance and decreased risk for falls    Baseline  10/08/17: 16/24    Time  8    Period  Weeks    Status  New    Target Date  12/03/17      PT LONG TERM GOAL #4   Title    Pt will improve BERG by at least 3 points in order to demonstrate clinically significant improvement in balance.      Baseline  10/15/17: 53/56    Time  8    Period  Weeks    Status  New    Target Date  12/03/17            Plan - 11/05/17 1026    Clinical Impression Statement  Patient able to progress to VOR in standing with conflicting background this date. Patient able to demonstrate full tandem on firm surface and was able to progress to adding slow head turns. Patient challenged by retro ambulation and ball tracking exercises this date. Patient would benefit from continued PT services to further work towards goals as set on POC and to try to reduce patient's subjective symptoms of imbalance.     Rehab Potential  Fair    PT Frequency  1x / week    PT Duration  8 weeks    PT Treatment/Interventions  ADLs/Self Care Home Management;Aquatic Therapy;Canalith Repostioning;Cryotherapy;Electrical Stimulation;Moist Heat;Iontophoresis 4mg /ml Dexamethasone;Traction;Ultrasound;DME Instruction;Gait training;Stair training;Functional mobility training;Therapeutic activities;Therapeutic exercise;Balance training;Neuromuscular re-education;Patient/family education;Manual techniques;Passive range of motion;Energy conservation;Vestibular    PT Next Visit Plan  VOR with conflicting background, amb with head turns, static and then amb with ball toss over shoulder, leg press or biodex tower resisted walking    PT Home Exercise Plan  VOR x 1 horizontal in standing with feet apart, sit to stand without UE support 2 x 10, semitandem balane with horizontal head turns    Consulted and Agree with Plan of Care  Patient       Patient will benefit from skilled therapeutic intervention in order to improve the following deficits and impairments:  Abnormal gait, Decreased balance, Dizziness  Visit Diagnosis: Dizziness and giddiness  Unsteadiness on feet     Problem List Patient Active Problem List    Diagnosis Date Noted  . Hyperlipidemia 04/20/2017  . Facial contracture 10/22/2016  . Insomnia 06/15/2016  . Oral candidiasis 12/11/2015  . Urinary tract infection symptoms 12/11/2015  . Need for immunization against influenza 08/28/2015  . Acute low back pain with radicular symptoms, duration less than 6 weeks 08/28/2015  . Need for vaccination 08/21/2015  . Lumbar disc disease with radiculopathy 08/20/2015  . Lumbar herniated disc 08/20/2015  . Uncontrolled diabetes mellitus (HCC) 08/20/2015  . Left-sided low back pain with sciatica 06/03/2015  . Abdominal gas pain 06/03/2015  . Abdominal pain, generalized 06/03/2015  . Bipolar disorder (HCC) 06/03/2015  . Insomnia, persistent 06/03/2015  . BP (high blood pressure) 06/03/2015  . Type 2 diabetes mellitus treated with insulin (HCC) 06/03/2015  . Leg swelling 06/03/2015  . Extreme obesity 06/03/2015  . Compulsive tobacco user syndrome 06/03/2015  . Vaginitis due to Candida 06/03/2015  . Phlebectasia 06/03/2015   Mardelle Matte PT, DPT [phone] Mardelle Matte 11/05/2017, 3:01 PM  [facility] MAIN [facility] [address] [place], [place], [address] Phone: [phone]   Fax:  [phone]  Name: Debra [name] MRN: [id number] Date of Birth: [birth year]

## 2017-11-08 ENCOUNTER — Telehealth: Payer: Self-pay

## 2017-11-08 NOTE — Telephone Encounter (Signed)
Patient has had intolerance to multiple statins in the past, please review her [place] [facility] [place] to determine which statins has she tried and failed in the past. Thank you

## 2017-11-08 NOTE — Telephone Encounter (Signed)
Insurance will not cover livalo but will cover atorvastatin, lovastatin, pravastatin, rosuvastatin and simvastatin. Insurance requires failure to all alternatives before covering livalo.

## 2017-11-11 NOTE — Telephone Encounter (Signed)
Reviewed all the charts: [place] when she started her care with you as well to [facility] . Its mention at one of her visit that she had side effect and was documented on her allergies but I do not see where it was prescribed. The forms form [employer] asking for If any alternative medications has been tired to put down the drug name, dose, date and duration and explain the clincal results of the therapy.

## 2017-11-12 ENCOUNTER — Telehealth: Payer: Self-pay | Admitting: Family Medicine

## 2017-11-12 ENCOUNTER — Ambulatory Visit: Payer: Managed Care, Other (non HMO) | Admitting: Physical Therapy

## 2017-11-12 NOTE — Telephone Encounter (Signed)
I called this patient back to inform her that I have not received any paperwork for a PA as of this moment and that we do not know what her Part D prescription coverage is, so I am unable to do a PA but there was no answer. A message was left explaining this to her and asking her to get the pharmacy to submit a request for a PA so we will have the code or at least the bin number.

## 2017-11-12 NOTE — Telephone Encounter (Signed)
lvm for patient to return call to schedule appt °

## 2017-11-12 NOTE — Telephone Encounter (Signed)
Copied from CRM [phone]. Topic: General - Other >> Nov 12, 2017  1:51 PM Cecelia ByarsGreen, Kieu Quiggle L, RMA wrote: Reason for CRM: pt's insurance is requesting a prior authorization for Livalo 2 mg, please contact pt at [phone]

## 2017-11-12 NOTE — Telephone Encounter (Signed)
Schedule patient for an appointment so we can document and discussed different statins that she has tried in the past and possible adverse effects. We will select the most appropriate statin from the remainder of the list to start her on in place of Livalo.

## 2017-11-18 DIAGNOSIS — R2689 Other abnormalities of gait and mobility: Secondary | ICD-10-CM | POA: Diagnosis not present

## 2017-11-18 DIAGNOSIS — I6381 Other cerebral infarction due to occlusion or stenosis of small artery: Secondary | ICD-10-CM | POA: Diagnosis not present

## 2017-11-18 DIAGNOSIS — G4752 REM sleep behavior disorder: Secondary | ICD-10-CM | POA: Diagnosis not present

## 2017-11-18 DIAGNOSIS — R0683 Snoring: Secondary | ICD-10-CM | POA: Diagnosis not present

## 2017-11-18 DIAGNOSIS — G20 Parkinson's disease: Secondary | ICD-10-CM | POA: Diagnosis not present

## 2017-11-19 ENCOUNTER — Encounter: Payer: Managed Care, Other (non HMO) | Admitting: Physical Therapy

## 2017-11-25 ENCOUNTER — Other Ambulatory Visit (HOSPITAL_COMMUNITY): Payer: Self-pay | Admitting: Neurology

## 2017-11-25 DIAGNOSIS — R2689 Other abnormalities of gait and mobility: Secondary | ICD-10-CM

## 2017-11-26 ENCOUNTER — Encounter: Payer: Managed Care, Other (non HMO) | Admitting: Physical Therapy

## 2017-12-23 ENCOUNTER — Encounter (HOSPITAL_COMMUNITY): Payer: Self-pay

## 2017-12-23 ENCOUNTER — Other Ambulatory Visit (HOSPITAL_COMMUNITY): Payer: Managed Care, Other (non HMO)

## 2017-12-31 ENCOUNTER — Other Ambulatory Visit: Payer: Self-pay | Admitting: Family Medicine

## 2017-12-31 DIAGNOSIS — I10 Essential (primary) hypertension: Secondary | ICD-10-CM

## 2017-12-31 DIAGNOSIS — F3176 Bipolar disorder, in full remission, most recent episode depressed: Secondary | ICD-10-CM

## 2017-12-31 MED ORDER — LISINOPRIL-HYDROCHLOROTHIAZIDE 20-12.5 MG PO TABS: 1.0000 | ORAL_TABLET | Freq: Every day | ORAL | 0 refills | Status: DC

## 2017-12-31 MED ORDER — PAROXETINE HCL 40 MG PO TABS: 40.0000 mg | ORAL_TABLET | Freq: Every day | ORAL | 0 refills | Status: DC

## 2017-12-31 NOTE — Telephone Encounter (Signed)
Copied from CRM [phone]. Topic: Quick Communication - Rx Refill/Question >> Dec 31, 2017  1:38 PM Landry MellowFoltz, Melissa J wrote: Medication: lisinopril-hydrochlorothiazide (ZESTORETIC) 20-12.5 MG tablet and PARoxetine (PAXIL) 40 MG tablet   Has the patient contacted their pharmacy? No. New pharmacy    (Agent: If no, request that the patient contact the pharmacy for the refill.)   Preferred Pharmacy (with phone number or street name): [facility] [address]    Agent: Please be advised that RX refills may take up to 3 business days. We ask that you follow-up with your pharmacy.

## 2017-12-31 NOTE — Telephone Encounter (Signed)
Pt  Requesting a  Refill  Of  Zestoretic   And  Paxil

## 2018-01-19 ENCOUNTER — Ambulatory Visit: Payer: Managed Care, Other (non HMO) | Admitting: Family Medicine

## 2018-01-21 ENCOUNTER — Other Ambulatory Visit: Payer: Self-pay | Admitting: Family Medicine

## 2018-01-21 DIAGNOSIS — E1151 Type 2 diabetes mellitus with diabetic peripheral angiopathy without gangrene: Secondary | ICD-10-CM

## 2018-01-21 DIAGNOSIS — IMO0002 Reserved for concepts with insufficient information to code with codable children: Secondary | ICD-10-CM

## 2018-01-21 DIAGNOSIS — I10 Essential (primary) hypertension: Secondary | ICD-10-CM

## 2018-01-21 DIAGNOSIS — E1165 Type 2 diabetes mellitus with hyperglycemia: Principal | ICD-10-CM

## 2018-01-21 DIAGNOSIS — I70209 Unspecified atherosclerosis of native arteries of extremities, unspecified extremity: Principal | ICD-10-CM

## 2018-01-21 MED ORDER — LISINOPRIL-HYDROCHLOROTHIAZIDE 20-12.5 MG PO TABS: 1.0000 | ORAL_TABLET | Freq: Every day | ORAL | 0 refills | Status: DC

## 2018-01-28 ENCOUNTER — Ambulatory Visit (INDEPENDENT_AMBULATORY_CARE_PROVIDER_SITE_OTHER): Payer: Managed Care, Other (non HMO) | Admitting: Family Medicine

## 2018-01-28 ENCOUNTER — Encounter: Payer: Self-pay | Admitting: Family Medicine

## 2018-01-28 VITALS — BP 148/100 | HR 72 | Temp 97.7°F | Ht 65.0 in | Wt 249.4 lb

## 2018-01-28 DIAGNOSIS — Z794 Long term (current) use of insulin: Secondary | ICD-10-CM

## 2018-01-28 DIAGNOSIS — E119 Type 2 diabetes mellitus without complications: Secondary | ICD-10-CM

## 2018-01-28 DIAGNOSIS — Z1231 Encounter for screening mammogram for malignant neoplasm of breast: Secondary | ICD-10-CM | POA: Diagnosis not present

## 2018-01-28 DIAGNOSIS — R197 Diarrhea, unspecified: Secondary | ICD-10-CM | POA: Diagnosis not present

## 2018-01-28 DIAGNOSIS — E7800 Pure hypercholesterolemia, unspecified: Secondary | ICD-10-CM

## 2018-01-28 DIAGNOSIS — Z1211 Encounter for screening for malignant neoplasm of colon: Secondary | ICD-10-CM | POA: Diagnosis not present

## 2018-01-28 MED ORDER — TRESIBA FLEXTOUCH 200 UNIT/ML ~~LOC~~ SOPN: 110.0000 [IU] | PEN_INJECTOR | Freq: Every day | SUBCUTANEOUS | 1 refills | Status: DC

## 2018-01-28 MED ORDER — PAROXETINE HCL 20 MG PO TABS: 20.0000 mg | ORAL_TABLET | Freq: Every day | ORAL | 2 refills | Status: DC

## 2018-01-28 MED ORDER — FLUCONAZOLE 150 MG PO TABS: 150.0000 mg | ORAL_TABLET | Freq: Every day | ORAL | 1 refills | Status: DC

## 2018-01-28 NOTE — Patient Instructions (Addendum)
Rest and hydration Try to drink more water and less tea We'll get labs If you have not heard anything from my staff in a week about any orders/referrals/studies from today, please contact us here to follow-up [phone] Decrease your once daily insulin to 110 units and then we can increase it back later when sugars are up If you have not heard anything from my staff in 4-5 days about any orders/referrals/studies from today, please contact us here to follow-up [phone]

## 2018-01-28 NOTE — Progress Notes (Signed)
BP (!) 148/100 (BP Location: Left Arm, Patient Position: Sitting, Cuff Size: Large)   Pulse 72   Temp 97.7 F (36.5 C) (Oral)   Ht 5\' 5"  (1.651 m)   Wt 249 lb 6.4 oz (113.1 kg)   SpO2 98%   BMI 41.50 kg/m    Subjective:    Patient ID: Debra BowieKim A [name], female    DOB: 1961/09/29, 57 y.o.   MRN: [id number]  HPI: Debra [name] is a 57 y.o. female  Chief Complaint  Patient presents with  . Diarrhea    Pt states that she has diarrhea for a month, nothing stays down   . Emesis  . Diabetes  . Hypertension    HPI Patient is here for sick visit; diarrhea for one month; eats something and within 20 minutes, it's coming out; it's not runny; it's like 5 to 6 on Bristol scale; no fevers; no travel; appetite is there but she's afraid that thing will go through her; using two boxes of imodium and pepto bismol, kaopectate; nothing help; no sick contacts; city water; has a dog, was sick for one day, but dog is better now;   Type 2 diabetes; NOT on metformin; on Farxiga; has a yeast infection, really bad; managed by Dr. Tedd SiasSolum but she would like to have that managed her; she has decreased her humalog to 25 units; low sugars down to 67; no dry mouth or dry mouths; stays hydrated with iced tea  She went to see neurologist for her Parkinson's; her pituitary gland was enlarged, so they wanted her to decrease her paroxetine by 50%  HTN; husband thinks it's just because she is sick today; usually BP at home is 117 systolic  Depression screen [facility] 2/9 01/28/2018 10/20/2017 09/29/2017 09/23/2017 07/08/2017  Decreased Interest 0 0 0 0 0  Down, Depressed, Hopeless 0 0 0 0 0  PHQ - 2 Score 0 0 0 0 0    Relevant past medical, surgical, family and social history reviewed Past Medical History:  Diagnosis Date  . Anxiety   . Arthritis   . Depression   . Diabetes mellitus without complication (HCC)    Type II  . Family history of adverse reaction to anesthesia    Mother - BP drops  . Head injury, closed, with  brief LOC (HCC) 2011ish  . Hyperlipidemia   . Hypertension   . Insomnia   . Obesity   . Parkinson's disease (HCC)   . Seasonal allergies   . Tobacco abuse    Past Surgical History:  Procedure Laterality Date  . ABDOMINAL HYSTERECTOMY  2012  . BREAST BIOPSY Right   . CESAREAN SECTION  1992  . CHOLECYSTECTOMY  2013  . LUMBAR LAMINECTOMY/DECOMPRESSION MICRODISCECTOMY Left 09/10/2015   Procedure: Left L4-5 Foraminotomy/Left L5-S1 Diskectomy;  Surgeon: Hilda LiasErnesto Botero, MD;  Location: [facility];  Service: Neurosurgery;  Laterality: Left;  Left L4-5 Foraminotomy/Left L5-S1 Diskectomy   Family History  Problem Relation Age of Onset  . COPD Mother   . Bipolar disorder Father   . Diabetes Mellitus II Father   . Bipolar disorder Brother   . Diabetes Mellitus II Brother   . Bipolar disorder Brother    Social History   Tobacco Use  . Smoking status: Current Every Day Smoker    Packs/day: 0.50    Years: 24.00    Pack years: 12.00    Types: Cigarettes  . Smokeless tobacco: Never Used  Substance Use Topics  . Alcohol use: No  Alcohol/week: 0.0 oz    Comment: occasional- rare  . Drug use: No    Interim medical history since last visit reviewed. Allergies and medications reviewed  Review of Systems Per HPI unless specifically indicated above     Objective:    BP (!) 148/100 (BP Location: Left Arm, Patient Position: Sitting, Cuff Size: Large)   Pulse 72   Temp 97.7 F (36.5 C) (Oral)   Ht 5\' 5"  (1.651 m)   Wt 249 lb 6.4 oz (113.1 kg)   SpO2 98%   BMI 41.50 kg/m   Wt Readings from Last 3 Encounters:  01/28/18 249 lb 6.4 oz (113.1 kg)  10/20/17 250 lb (113.4 kg)  09/29/17 250 lb 3.2 oz (113.5 kg)    Physical Exam  Constitutional: She appears well-developed and well-nourished. No distress.  HENT:  Head: Normocephalic and atraumatic.  Eyes: EOM are normal. No scleral icterus.  Neck: No thyromegaly present.  Cardiovascular: Normal rate, regular rhythm and normal  heart sounds.  No murmur heard. Pulmonary/Chest: Effort normal and breath sounds normal.  Abdominal: Soft. Bowel sounds are normal. She exhibits no distension. There is tenderness (ruq).  Musculoskeletal: Normal range of motion. She exhibits no edema.  Neurological: She is alert. She exhibits normal muscle tone.  Skin: Skin is warm. She is diaphoretic. No pallor.  Psychiatric: She has a normal mood and affect. Her behavior is normal. Judgment and thought content normal.   Diabetic Foot Form - Detailed   Diabetic Foot Exam - detailed Diabetic Foot exam was performed with the following findings:  Yes 01/28/2018  2:08 PM  Visual Foot Exam completed.:  Yes  Pulse Foot Exam completed.:  Yes  Right Dorsalis Pedis:  Present Left Dorsalis Pedis:  Present  Sensory Foot Exam Completed.:  Yes Semmes-Weinstein Monofilament Test R Site 1-Great Toe:  Pos L Site 1-Great Toe:  Pos    Comments:  Diminished monofilament sensation LATERAL LEFT foot      Results for orders placed or performed in visit on 01/28/18  Gastrointestinal Pathogen Panel PCR  Result Value Ref Range   Campylobacter, PCR NOT DETECTED NOT DETECT   C. difficile Tox A/B, PCR NOT DETECTED NOT DETECT   E coli 0157, PCR NOT DETECTED NOT DETECT   E coli (ETEC) LT/ST PCR NOT DETECTED NOT DETECT   E coli (STEC) stx1/stx2, PCR NOT DETECTED NOT DETECT   Salmonella, PCR NOT DETECTED NOT DETECT   Shigella, PCR NOT DETECTED NOT DETECT   Norovirus, PCR NOT DETECTED NOT DETECT   Rotavirus A, PCR NOT DETECTED NOT DETECT   Giardia lamblia, PCR NOT DETECTED NOT DETECT   Cryptosporidium, PCR NOT DETECTED NOT DETECT  COMPLETE METABOLIC PANEL WITH GFR  Result Value Ref Range   Glucose, Bld 116 (H) 65 - 99 mg/dL   BUN 15 7 - 25 mg/dL   Creat 1.61 0.96 - 0.45 mg/dL   GFR, Est Non African American 90 > OR = 60 mL/min/1.65m2   GFR, Est African American 104 > OR = 60 mL/min/1.80m2   BUN/Creatinine Ratio NOT APPLICABLE 6 - 22 (calc)   Sodium 142  135 - 146 mmol/L   Potassium 4.1 3.5 - 5.3 mmol/L   Chloride 102 98 - 110 mmol/L   CO2 27 20 - 32 mmol/L   Calcium 10.1 8.6 - 10.4 mg/dL   Total Protein 6.9 6.1 - 8.1 g/dL   Albumin 4.4 3.6 - 5.1 g/dL   Globulin 2.5 1.9 - 3.7 g/dL (calc)   AG  Ratio 1.8 1.0 - 2.5 (calc)   Total Bilirubin 0.8 0.2 - 1.2 mg/dL   Alkaline phosphatase (APISO) 91 33 - 130 U/L   AST 23 10 - 35 U/L   ALT 33 (H) 6 - 29 U/L  CBC with Differential/Platelet  Result Value Ref Range   WBC 11.3 (H) 3.8 - 10.8 Thousand/uL   RBC 5.22 (H) 3.80 - 5.10 Million/uL   Hemoglobin 16.2 (H) 11.7 - 15.5 g/dL   HCT 16.1 (H) 09.6 - 04.5 %   MCV 90.6 80.0 - 100.0 fL   MCH 31.0 27.0 - 33.0 pg   MCHC 34.2 32.0 - 36.0 g/dL   RDW 40.9 81.1 - 91.4 %   Platelets 325 140 - 400 Thousand/uL   MPV 9.9 7.5 - 12.5 fL   Neutro Abs 6,678 1,500 - 7,800 cells/uL   Lymphs Abs 3,356 850 - 3,900 cells/uL   WBC mixed population 848 200 - 950 cells/uL   Eosinophils Absolute 294 15 - 500 cells/uL   Basophils Absolute 124 0 - 200 cells/uL   Neutrophils Relative % 59.1 %   Total Lymphocyte 29.7 %   Monocytes Relative 7.5 %   Eosinophils Relative 2.6 %   Basophils Relative 1.1 %  Lipid panel  Result Value Ref Range   Cholesterol 209 (H) <200 mg/dL   HDL 46 (L) >78 mg/dL   Triglycerides 295 (H) <150 mg/dL   LDL Cholesterol (Calc) 122 (H) mg/dL (calc)   Total CHOL/HDL Ratio 4.5 <5.0 (calc)   Non-HDL Cholesterol (Calc) 163 (H) <130 mg/dL (calc)  Hemoglobin A2Z  Result Value Ref Range   Hgb A1c MFr Bld 7.3 (H) <5.7 % of total Hgb   Mean Plasma Glucose 163 (calc)   eAG (mmol/L) 9.0 (calc)      Assessment & Plan:   Problem List Items Addressed This Visit      Endocrine   Type 2 diabetes mellitus treated with insulin (HCC)   Relevant Medications   TRESIBA FLEXTOUCH 200 UNIT/ML SOPN   Other Relevant Orders   Hemoglobin A1c     Other   Hyperlipidemia   Relevant Medications   propranolol ER (INDERAL LA) 80 MG 24 hr capsule   Other  Relevant Orders   Lipid panel    Other Visit Diagnoses    Diarrhea of presumed infectious origin    -  Primary   Relevant Orders   Gastrointestinal Pathogen Panel PCR (Completed)   COMPLETE METABOLIC PANEL WITH GFR (Completed)   CBC with Differential/Platelet (Completed)   Encounter for screening mammogram for breast cancer       Relevant Orders   MM DIGITAL SCREENING BILATERAL   Screening for colon cancer       Relevant Orders   Cologuard       Follow up plan: Return in about 7 days (around 02/04/2018) for follow-up visit with Dr. Sherie Don.  An after-visit summary was printed and given to the patient at check-out.  Please see the patient instructions which may contain other information and recommendations beyond what is mentioned above in the assessment and plan.  Meds ordered this encounter  Medications  . fluconazole (DIFLUCAN) 150 MG tablet    Sig: Take 1 tablet (150 mg total) by mouth daily.    Dispense:  1 tablet    Refill:  1  . PARoxetine (PAXIL) 20 MG tablet    Sig: Take 1 tablet (20 mg total) by mouth daily.    Dispense:  30 tablet  Refill:  2  . TRESIBA FLEXTOUCH 200 UNIT/ML SOPN    Sig: Inject 110 Units into the skin at bedtime.    Dispense:  5 pen    Refill:  1    Orders Placed This Encounter  Procedures  . MM DIGITAL SCREENING BILATERAL  . Hemoglobin A1c  . Lipid panel  . Cologuard  . Gastrointestinal Pathogen Panel PCR  . COMPLETE METABOLIC PANEL WITH GFR  . CBC with Differential/Platelet  . Lipid panel  . Hemoglobin A1c

## 2018-01-29 LAB — COMPLETE METABOLIC PANEL WITH GFR
AG Ratio: 1.8 (calc) (ref 1.0–2.5)
ALT: 33 U/L — AB (ref 6–29)
AST: 23 U/L (ref 10–35)
Albumin: 4.4 g/dL (ref 3.6–5.1)
Alkaline phosphatase (APISO): 91 U/L (ref 33–130)
BUN: 15 mg/dL (ref 7–25)
CALCIUM: 10.1 mg/dL (ref 8.6–10.4)
CO2: 27 mmol/L (ref 20–32)
CREATININE: 0.75 mg/dL (ref 0.50–1.05)
Chloride: 102 mmol/L (ref 98–110)
GFR, EST AFRICAN AMERICAN: 104 mL/min/{1.73_m2} (ref >=60)
GFR, EST NON AFRICAN AMERICAN: 90 mL/min/{1.73_m2} (ref >=60)
Globulin: 2.5 g/dL (calc) (ref 1.9–3.7)
Glucose, Bld: 116 mg/dL — ABNORMAL HIGH (ref 65–99)
Potassium: 4.1 mmol/L (ref 3.5–5.3)
Sodium: 142 mmol/L (ref 135–146)
TOTAL PROTEIN: 6.9 g/dL (ref 6.1–8.1)
Total Bilirubin: 0.8 mg/dL (ref 0.2–1.2)

## 2018-01-29 LAB — LIPID PANEL
Cholesterol: 209 mg/dL — ABNORMAL HIGH (ref <200)
HDL: 46 mg/dL — ABNORMAL LOW (ref >50)
LDL CHOLESTEROL (CALC): 122 mg/dL — AB
Non-HDL Cholesterol (Calc): 163 mg/dL (calc) — ABNORMAL HIGH (ref <130)
TRIGLYCERIDES: 264 mg/dL — AB (ref <150)
Total CHOL/HDL Ratio: 4.5 (calc) (ref <5.0)

## 2018-01-29 LAB — CBC WITH DIFFERENTIAL/PLATELET
BASOS ABS: 124 {cells}/uL (ref 0–200)
Basophils Relative: 1.1 %
EOS PCT: 2.6 %
Eosinophils Absolute: 294 cells/uL (ref 15–500)
HEMATOCRIT: 47.3 % — AB (ref 35.0–45.0)
HEMOGLOBIN: 16.2 g/dL — AB (ref 11.7–15.5)
LYMPHS ABS: 3356 {cells}/uL (ref 850–3900)
MCH: 31 pg (ref 27.0–33.0)
MCHC: 34.2 g/dL (ref 32.0–36.0)
MCV: 90.6 fL (ref 80.0–100.0)
MPV: 9.9 fL (ref 7.5–12.5)
Monocytes Relative: 7.5 %
NEUTROS ABS: 6678 {cells}/uL (ref 1500–7800)
Neutrophils Relative %: 59.1 %
Platelets: 325 10*3/uL (ref 140–400)
RBC: 5.22 10*6/uL — AB (ref 3.80–5.10)
RDW: 13 % (ref 11.0–15.0)
Total Lymphocyte: 29.7 %
WBC: 11.3 10*3/uL — ABNORMAL HIGH (ref 3.8–10.8)
WBCMIX: 848 {cells}/uL (ref 200–950)

## 2018-01-29 LAB — HEMOGLOBIN A1C
HEMOGLOBIN A1C: 7.3 %{Hb} — AB (ref <5.7)
Mean Plasma Glucose: 163 (calc)
eAG (mmol/L): 9 (calc)

## 2018-01-30 ENCOUNTER — Encounter: Payer: Self-pay | Admitting: Family Medicine

## 2018-01-31 LAB — GASTROINTESTINAL PATHOGEN PANEL PCR
C. difficile Tox A/B, PCR: NOT DETECTED
Campylobacter, PCR: NOT DETECTED
Cryptosporidium, PCR: NOT DETECTED
E coli (ETEC) LT/ST PCR: NOT DETECTED
E coli (STEC) stx1/stx2, PCR: NOT DETECTED
E coli 0157, PCR: NOT DETECTED
Giardia lamblia, PCR: NOT DETECTED
Norovirus, PCR: NOT DETECTED
Rotavirus A, PCR: NOT DETECTED
Salmonella, PCR: NOT DETECTED
Shigella, PCR: NOT DETECTED

## 2018-02-02 DIAGNOSIS — G20 Parkinson's disease: Secondary | ICD-10-CM | POA: Diagnosis not present

## 2018-02-15 ENCOUNTER — Encounter: Payer: Self-pay | Admitting: Family Medicine

## 2018-02-15 ENCOUNTER — Other Ambulatory Visit: Payer: Self-pay

## 2018-02-15 ENCOUNTER — Ambulatory Visit (INDEPENDENT_AMBULATORY_CARE_PROVIDER_SITE_OTHER): Payer: Managed Care, Other (non HMO) | Admitting: Family Medicine

## 2018-02-15 VITALS — BP 142/100 | HR 78 | Temp 98.3°F | Wt 253.4 lb

## 2018-02-15 DIAGNOSIS — Z780 Asymptomatic menopausal state: Secondary | ICD-10-CM

## 2018-02-15 DIAGNOSIS — I70209 Unspecified atherosclerosis of native arteries of extremities, unspecified extremity: Secondary | ICD-10-CM | POA: Diagnosis not present

## 2018-02-15 DIAGNOSIS — E1165 Type 2 diabetes mellitus with hyperglycemia: Secondary | ICD-10-CM | POA: Diagnosis not present

## 2018-02-15 DIAGNOSIS — G4700 Insomnia, unspecified: Secondary | ICD-10-CM | POA: Diagnosis not present

## 2018-02-15 DIAGNOSIS — IMO0002 Reserved for concepts with insufficient information to code with codable children: Secondary | ICD-10-CM

## 2018-02-15 DIAGNOSIS — B372 Candidiasis of skin and nail: Secondary | ICD-10-CM

## 2018-02-15 DIAGNOSIS — I10 Essential (primary) hypertension: Secondary | ICD-10-CM | POA: Diagnosis not present

## 2018-02-15 DIAGNOSIS — E1151 Type 2 diabetes mellitus with diabetic peripheral angiopathy without gangrene: Secondary | ICD-10-CM

## 2018-02-15 MED ORDER — NYSTATIN 100000 UNIT/GM EX POWD: Freq: Four times a day (QID) | CUTANEOUS | 1 refills | Status: DC

## 2018-02-15 MED ORDER — LISINOPRIL 40 MG PO TABS: 40.0000 mg | ORAL_TABLET | Freq: Every day | ORAL | 1 refills | Status: DC

## 2018-02-15 MED ORDER — GLUCOSE BLOOD VI STRP: ORAL_STRIP | 3 refills | Status: DC

## 2018-02-15 MED ORDER — INSULIN LISPRO 200 UNIT/ML ~~LOC~~ SOPN: 40.0000 [IU] | PEN_INJECTOR | Freq: Two times a day (BID) | SUBCUTANEOUS | 2 refills | Status: DC

## 2018-02-15 MED ORDER — HYDROCHLOROTHIAZIDE 12.5 MG PO CAPS: 12.5000 mg | ORAL_CAPSULE | Freq: Every day | ORAL | 1 refills | Status: DC

## 2018-02-15 NOTE — Assessment & Plan Note (Signed)
Treated with trazodone

## 2018-02-15 NOTE — Progress Notes (Signed)
BP (!) 142/100   Pulse 78   Temp 98.3 F (36.8 C) (Oral)   Wt 253 lb 6.4 oz (114.9 kg)   SpO2 96%   BMI 42.17 kg/m    Today's Vitals   02/15/18 1016 02/15/18 1017 02/15/18 1049  BP: (!) 146/100  (!) 142/100  Pulse: 78    Temp: 98.3 F (36.8 C)    TempSrc: Oral    SpO2: 96%    Weight: 253 lb 6.4 oz (114.9 kg)    PainSc: 0-No pain 0-No pain     Subjective:    Patient ID: Debra [name], female    DOB: February 12, 1961, 57 y.o.   MRN: [id number]  HPI: Debra [name] is a 57 y.o. female  Chief Complaint  Patient presents with  . Follow-up    Pt states that sx are much better, please discuss Brain MRI   . Hypertension    HPI Patient presents for follow-up on diarrhea, diabetes and yeast infections.    Diarrhea She was seen for on  01/28/2018. It had been ongoing for 8 weeks all together. 20 minutes after eating she would have a BM- 5 to 6 on Bristol scale; no fevers; no travel; no decreased appetite; using two boxes of imodium and pepto bismol, kaopectate; and followed a bland diet and drank lots of water. Patient states symptoms resolved last Tuesday and has had daily normal BM. Patient states she notes that red meats caused increase bowel activity and so she has been avoiding it.  GI pathogen tests were all negative. Mildly elevated WBC at 11.3, Kidney function was good.   Diabetes Mellitus 2 Blood sugar range from last week has been 100-275 but normally mid-to-high 100's Symptoms: Patient notes she shakes, nausea, cold sweats when her blood sugar gets in the 70's- has not had that in a while. Denies polyuria, polyphagia or polydipsia.  Medication compliance: states is generally very compliant but had an issue with getting the farxiga and was out of it from last Tuesday to yesterday.  Last A1C- 7.3% done two weeks ago.  Optometrist- Has an appointment in July but plans on making an earlier appointment to adjust eye glass rx  Yeast Infection Rx fluconazole completed course. Vaginal  infection all clear! Patient has rash under abdominal area- red, mild pain; no itching. Patient noticed it yesterday; normally puts baby gold bond in this area but has not used it today.   MRI Results  Discussed, ordered by another provider, SPECT  Hypertension Not controlled today; changed BP medicines Used to be on 40 mg of lisinopril  Insomnia Using trazodone; sleeping relatively except for time change; happy with 50 mg at night   Depression screen [facility] 2/9 02/15/2018 01/28/2018 10/20/2017 09/29/2017 09/23/2017  Decreased Interest 0 0 0 0 0  Down, Depressed, Hopeless 0 0 0 0 0  PHQ - 2 Score 0 0 0 0 0    Relevant past medical, surgical, family and social history reviewed Past Medical History:  Diagnosis Date  . Anxiety   . Arthritis   . Depression   . Diabetes mellitus without complication (HCC)    Type II  . Family history of adverse reaction to anesthesia    Mother - BP drops  . Head injury, closed, with brief LOC (HCC) 2011ish  . Hyperlipidemia   . Hypertension   . Insomnia   . Obesity   . Parkinson's disease (HCC)   . Seasonal allergies   . Tobacco abuse  Past Surgical History:  Procedure Laterality Date  . ABDOMINAL HYSTERECTOMY  2012  . BREAST BIOPSY Right   . CESAREAN SECTION  1992  . CHOLECYSTECTOMY  2013  . LUMBAR LAMINECTOMY/DECOMPRESSION MICRODISCECTOMY Left 09/10/2015   Procedure: Left L4-5 Foraminotomy/Left L5-S1 Diskectomy;  Surgeon: Hilda LiasErnesto Botero, MD;  Location: [facility];  Service: Neurosurgery;  Laterality: Left;  Left L4-5 Foraminotomy/Left L5-S1 Diskectomy   Family History  Problem Relation Age of Onset  . COPD Mother   . Bipolar disorder Father   . Diabetes Mellitus II Father   . Bipolar disorder Brother   . Diabetes Mellitus II Brother   . Bipolar disorder Brother    Social History   Tobacco Use  . Smoking status: Current Every Day Smoker    Packs/day: 0.50    Years: 24.00    Pack years: 12.00    Types: Cigarettes  . Smokeless  tobacco: Never Used  Substance Use Topics  . Alcohol use: No    Alcohol/week: 0.0 oz    Comment: occasional- rare  . Drug use: No    Interim medical history since last visit reviewed. Allergies and medications reviewed  Review of Systems Per HPI unless specifically indicated above     Objective:    BP (!) 142/100   Pulse 78   Temp 98.3 F (36.8 C) (Oral)   Wt 253 lb 6.4 oz (114.9 kg)   SpO2 96%   BMI 42.17 kg/m   Wt Readings from Last 3 Encounters:  02/15/18 253 lb 6.4 oz (114.9 kg)  01/28/18 249 lb 6.4 oz (113.1 kg)  10/20/17 250 lb (113.4 kg)    Physical Exam  Constitutional: She is oriented to person, place, and time. She appears well-developed and well-nourished.  HENT:  Head: Normocephalic.  Neck: Normal range of motion.  Cardiovascular: Normal rate and regular rhythm.  Pulmonary/Chest: Effort normal and breath sounds normal.  Abdominal: Soft. Normal appearance. There is no tenderness. There is no CVA tenderness.    Musculoskeletal: Normal range of motion.  Neurological: She is alert and oriented to person, place, and time.  Skin: Skin is warm and dry. There is erythema.  Psychiatric: She has a normal mood and affect. Her behavior is normal. Judgment and thought content normal.   Diabetic Foot Form - Detailed   Diabetic Foot Exam - detailed Diabetic Foot exam was performed with the following findings:  Yes 02/15/2018 10:57 AM  Visual Foot Exam completed.:  Yes  Pulse Foot Exam completed.:  Yes  Right posterior Tibialias:  Present Left posterior Tibialias:  Present  Right Dorsalis Pedis:  Present Left Dorsalis Pedis:  Present  Sensory Foot Exam Completed.:  Yes Semmes-Weinstein Monofilament Test      Results for orders placed or performed in visit on 01/28/18  Gastrointestinal Pathogen Panel PCR  Result Value Ref Range   Campylobacter, PCR NOT DETECTED NOT DETECT   C. difficile Tox A/B, PCR NOT DETECTED NOT DETECT   E coli 0157, PCR NOT DETECTED NOT  DETECT   E coli (ETEC) LT/ST PCR NOT DETECTED NOT DETECT   E coli (STEC) stx1/stx2, PCR NOT DETECTED NOT DETECT   Salmonella, PCR NOT DETECTED NOT DETECT   Shigella, PCR NOT DETECTED NOT DETECT   Norovirus, PCR NOT DETECTED NOT DETECT   Rotavirus A, PCR NOT DETECTED NOT DETECT   Giardia lamblia, PCR NOT DETECTED NOT DETECT   Cryptosporidium, PCR NOT DETECTED NOT DETECT  COMPLETE METABOLIC PANEL WITH GFR  Result Value Ref Range  Glucose, Bld 116 (H) 65 - 99 mg/dL   BUN 15 7 - 25 mg/dL   Creat 1.61 0.96 - 0.45 mg/dL   GFR, Est Non African American 90 > OR = 60 mL/min/1.9m2   GFR, Est African American 104 > OR = 60 mL/min/1.33m2   BUN/Creatinine Ratio NOT APPLICABLE 6 - 22 (calc)   Sodium 142 135 - 146 mmol/L   Potassium 4.1 3.5 - 5.3 mmol/L   Chloride 102 98 - 110 mmol/L   CO2 27 20 - 32 mmol/L   Calcium 10.1 8.6 - 10.4 mg/dL   Total Protein 6.9 6.1 - 8.1 g/dL   Albumin 4.4 3.6 - 5.1 g/dL   Globulin 2.5 1.9 - 3.7 g/dL (calc)   AG Ratio 1.8 1.0 - 2.5 (calc)   Total Bilirubin 0.8 0.2 - 1.2 mg/dL   Alkaline phosphatase (APISO) 91 33 - 130 U/L   AST 23 10 - 35 U/L   ALT 33 (H) 6 - 29 U/L  CBC with Differential/Platelet  Result Value Ref Range   WBC 11.3 (H) 3.8 - 10.8 Thousand/uL   RBC 5.22 (H) 3.80 - 5.10 Million/uL   Hemoglobin 16.2 (H) 11.7 - 15.5 g/dL   HCT 40.9 (H) 81.1 - 91.4 %   MCV 90.6 80.0 - 100.0 fL   MCH 31.0 27.0 - 33.0 pg   MCHC 34.2 32.0 - 36.0 g/dL   RDW 78.2 95.6 - 21.3 %   Platelets 325 140 - 400 Thousand/uL   MPV 9.9 7.5 - 12.5 fL   Neutro Abs 6,678 1,500 - 7,800 cells/uL   Lymphs Abs 3,356 850 - 3,900 cells/uL   WBC mixed population 848 200 - 950 cells/uL   Eosinophils Absolute 294 15 - 500 cells/uL   Basophils Absolute 124 0 - 200 cells/uL   Neutrophils Relative % 59.1 %   Total Lymphocyte 29.7 %   Monocytes Relative 7.5 %   Eosinophils Relative 2.6 %   Basophils Relative 1.1 %  Lipid panel  Result Value Ref Range   Cholesterol 209 (H) <200 mg/dL    HDL 46 (L) >08 mg/dL   Triglycerides 657 (H) <150 mg/dL   LDL Cholesterol (Calc) 122 (H) mg/dL (calc)   Total CHOL/HDL Ratio 4.5 <5.0 (calc)   Non-HDL Cholesterol (Calc) 163 (H) <130 mg/dL (calc)  Hemoglobin Q4O  Result Value Ref Range   Hgb A1c MFr Bld 7.3 (H) <5.7 % of total Hgb   Mean Plasma Glucose 163 (calc)   eAG (mmol/L) 9.0 (calc)      Assessment & Plan:   Problem List Items Addressed This Visit      Cardiovascular and Mediastinum   BP (high blood pressure) - Primary    Increase ACE-I component to 40 mg daily; leave the HCTZ alone      Relevant Medications   lisinopril (PRINIVIL,ZESTRIL) 40 MG tablet   hydrochlorothiazide (MICROZIDE) 12.5 MG capsule     Other   Insomnia, persistent    Treated with trazodone       Other Visit Diagnoses    Uncontrolled diabetes mellitus type 2 with atherosclerosis of arteries of extremities (HCC)       Relevant Medications   glucose blood (ONE TOUCH ULTRA TEST) test strip   lisinopril (PRINIVIL,ZESTRIL) 40 MG tablet   hydrochlorothiazide (MICROZIDE) 12.5 MG capsule   Insulin Lispro (HUMALOG KWIKPEN) 200 UNIT/ML SOPN   Postmenopausal       Relevant Orders   DG Bone Density   Candida infection of flexural  skin       keep area dry; nystatin powder   Relevant Medications   nystatin (NYSTATIN) powder      Follow up plan: Return in about 2 months (around 04/28/2018) for follow-up visit with Dr. Sherie Don (with labs too).  An after-visit summary was printed and given to the patient at check-out.  Please see the patient instructions which may contain other information and recommendations beyond what is mentioned above in the assessment and plan.  Meds ordered this encounter  Medications  . glucose blood (ONE TOUCH ULTRA TEST) test strip    Sig: Check fingerstick blood sugars three times a day; LON 99 months; Dx E11.65    Dispense:  300 each    Refill:  3  . lisinopril (PRINIVIL,ZESTRIL) 40 MG tablet    Sig: Take 1 tablet (40 mg  total) by mouth daily.    Dispense:  30 tablet    Refill:  1    Stop combo 20/12.5  . hydrochlorothiazide (MICROZIDE) 12.5 MG capsule    Sig: Take 1 capsule (12.5 mg total) by mouth daily.    Dispense:  30 capsule    Refill:  1    Stop combo 20/12.5  . nystatin (NYSTATIN) powder    Sig: Apply topically 4 (four) times daily. To affected skin    Dispense:  15 g    Refill:  1  . Insulin Lispro (HUMALOG KWIKPEN) 200 UNIT/ML SOPN    Sig: Inject 40 Units into the skin 2 (two) times daily before a meal. Breakfast and lunch, and just 35 units at supper    Dispense:  15 pen    Refill:  2    Orders Placed This Encounter  Procedures  . DG Bone Density

## 2018-02-15 NOTE — Assessment & Plan Note (Signed)
Increase ACE-I component to 40 mg daily; leave the HCTZ alone

## 2018-02-15 NOTE — Patient Instructions (Addendum)
Use the new powder in the creases for the rash Increase the lisinopril to 40 mg daily Continue the HCTZ at 12.5 mg daily We can do 90 day supplies once we figure out if this is the right dose Please do call to schedule your bone density study; the number to schedule one at either [facility] or [facility] is [phone] or [phone]  Hypoglycemia Hypoglycemia is when the sugar (glucose) level in the blood is too low. Symptoms of low blood sugar may include:  Feeling: ? Hungry. ? Worried or nervous (anxious). ? Sweaty and clammy. ? Confused. ? Dizzy. ? Sleepy. ? Sick to your stomach (nauseous).  Having: ? A fast heartbeat. ? A headache. ? A change in your vision. ? Jerky movements that you cannot control (seizure). ? Nightmares. ? Tingling or no feeling (numbness) around the mouth, lips, or tongue.  Having trouble with: ? Talking. ? Paying attention (concentrating). ? Moving (coordination). ? Sleeping.  Shaking.  Passing out (fainting).  Getting upset easily (irritability).  Low blood sugar can happen to people who have diabetes and people who do not have diabetes. Low blood sugar can happen quickly, and it can be an emergency. Treating Low Blood Sugar Low blood sugar is often treated by eating or drinking something sugary right away. If you can think clearly and swallow safely, follow the 15:15 rule:  Take 15 grams of a fast-acting carb (carbohydrate). Some fast-acting carbs are: ? 1 tube of glucose gel. ? 3 sugar tablets (glucose pills). ? 6-8 pieces of hard candy. ? 4 oz (120 mL) of fruit juice. ? 4 oz (120 mL) of regular (not diet) soda.  Check your blood sugar 15 minutes after you take the carb.  If your blood sugar is still at or below 70 mg/dL (3.9 mmol/L), take 15 grams of a carb again.  If your blood sugar does not go above 70 mg/dL (3.9 mmol/L) after 3 tries, get help right away.  After your blood sugar goes back to  normal, eat a meal or a snack within 1 hour.  Treating Very Low Blood Sugar If your blood sugar is at or below 54 mg/dL (3 mmol/L), you have very low blood sugar (severe hypoglycemia). This is an emergency. Do not wait to see if the symptoms will go away. Get medical help right away. Call your local emergency services (911 in the [place] Do not drive yourself to the hospital. If you have very low blood sugar and you cannot eat or drink, you may need a glucagon shot (injection). A family member or friend should learn how to check your blood sugar and how to give you a glucagon shot. Ask your doctor if you need to have a glucagon shot kit at home. Follow these instructions at home: General instructions  Avoid any diets that cause you to not eat enough food. Talk with your doctor before you start any new diet.  Take over-the-counter and prescription medicines only as told by your doctor.  Limit alcohol to no more than 1 drink per day for nonpregnant women and 2 drinks per day for men. One drink equals 12 oz of beer, 5 oz of wine, or 1 oz of hard liquor.  Keep all follow-up visits as told by your doctor. This is important. If You Have Diabetes:   Make sure you know the symptoms of low blood sugar.  Always keep a source of sugar with you, such as: ? Sugar. ? Sugar  tablets. ? Glucose gel. ? Fruit juice. ? Regular soda (not diet soda). ? Milk. ? Hard candy. ? Honey.  Take your medicines as told.  Follow your exercise and meal plan. ? Eat on time. Do not skip meals. ? Follow your sick day plan when you cannot eat or drink normally. Make this plan ahead of time with your doctor.  Check your blood sugar as often as told by your doctor. Always check before and after exercise.  Share your diabetes care plan with: ? Your work or school. ? People you live with.  Check your pee (urine) for ketones: ? When you are sick. ? As told by your doctor.  Carry a card or wear jewelry that says  you have diabetes. If You Have Low Blood Sugar From Other Causes:   Check your blood sugar as often as told by your doctor.  Follow instructions from your doctor about what you cannot eat or drink. Contact a doctor if:  You have trouble keeping your blood sugar in your target range.  You have low blood sugar often. Get help right away if:  You still have symptoms after you eat or drink something sugary.  Your blood sugar is at or below 54 mg/dL (3 mmol/L).  You have jerky movements that you cannot control.  You pass out. These symptoms may be an emergency. Do not wait to see if the symptoms will go away. Get medical help right away. Call your local emergency services (911 in the [place] Do not drive yourself to the hospital. This information is not intended to replace advice given to you by your health care provider. Make sure you discuss any questions you have with your health care provider. Document Released: 02/17/2010 Document Revised: 04/30/2016 Document Reviewed: 12/27/2015 Elsevier Interactive Patient Education  [employer].

## 2018-02-18 DIAGNOSIS — I6381 Other cerebral infarction due to occlusion or stenosis of small artery: Secondary | ICD-10-CM | POA: Diagnosis not present

## 2018-02-18 DIAGNOSIS — G20 Parkinson's disease: Secondary | ICD-10-CM | POA: Diagnosis not present

## 2018-02-18 DIAGNOSIS — G4733 Obstructive sleep apnea (adult) (pediatric): Secondary | ICD-10-CM | POA: Diagnosis not present

## 2018-02-18 DIAGNOSIS — R2689 Other abnormalities of gait and mobility: Secondary | ICD-10-CM | POA: Diagnosis not present

## 2018-02-18 DIAGNOSIS — F99 Mental disorder, not otherwise specified: Secondary | ICD-10-CM | POA: Diagnosis not present

## 2018-02-18 DIAGNOSIS — G4752 REM sleep behavior disorder: Secondary | ICD-10-CM | POA: Diagnosis not present

## 2018-02-19 ENCOUNTER — Encounter: Payer: Self-pay | Admitting: Family Medicine

## 2018-02-19 ENCOUNTER — Encounter: Payer: Self-pay | Admitting: Emergency Medicine

## 2018-02-19 ENCOUNTER — Emergency Department: Payer: Managed Care, Other (non HMO)

## 2018-02-19 ENCOUNTER — Emergency Department
Admission: EM | Admit: 2018-02-19 | Discharge: 2018-02-19 | Disposition: A | Discharge status: Home / Self Care | Payer: Managed Care, Other (non HMO) | Attending: Emergency Medicine | Admitting: Emergency Medicine

## 2018-02-19 DIAGNOSIS — Z794 Long term (current) use of insulin: Secondary | ICD-10-CM | POA: Diagnosis not present

## 2018-02-19 DIAGNOSIS — E119 Type 2 diabetes mellitus without complications: Secondary | ICD-10-CM | POA: Diagnosis not present

## 2018-02-19 DIAGNOSIS — R109 Unspecified abdominal pain: Secondary | ICD-10-CM

## 2018-02-19 DIAGNOSIS — F17210 Nicotine dependence, cigarettes, uncomplicated: Secondary | ICD-10-CM | POA: Diagnosis not present

## 2018-02-19 DIAGNOSIS — R1013 Epigastric pain: Secondary | ICD-10-CM | POA: Insufficient documentation

## 2018-02-19 DIAGNOSIS — Z79899 Other long term (current) drug therapy: Secondary | ICD-10-CM | POA: Insufficient documentation

## 2018-02-19 DIAGNOSIS — G20 Parkinson's disease: Secondary | ICD-10-CM | POA: Diagnosis not present

## 2018-02-19 DIAGNOSIS — E785 Hyperlipidemia, unspecified: Secondary | ICD-10-CM | POA: Diagnosis not present

## 2018-02-19 DIAGNOSIS — I10 Essential (primary) hypertension: Secondary | ICD-10-CM | POA: Diagnosis not present

## 2018-02-19 DIAGNOSIS — Z7982 Long term (current) use of aspirin: Secondary | ICD-10-CM | POA: Diagnosis not present

## 2018-02-19 DIAGNOSIS — R1033 Periumbilical pain: Secondary | ICD-10-CM | POA: Diagnosis present

## 2018-02-19 LAB — COMPREHENSIVE METABOLIC PANEL
ALK PHOS: 83 U/L (ref 38–126)
ALT: 19 U/L (ref 14–54)
AST: 20 U/L (ref 15–41)
Albumin: 4 g/dL (ref 3.5–5.0)
Anion gap: 10 (ref 5–15)
BUN: 19 mg/dL (ref 6–20)
CALCIUM: 9.6 mg/dL (ref 8.9–10.3)
CHLORIDE: 105 mmol/L (ref 101–111)
CO2: 26 mmol/L (ref 22–32)
CREATININE: 0.75 mg/dL (ref 0.44–1.00)
GFR calc Af Amer: >60 mL/min (ref >60)
GFR calc non Af Amer: >60 mL/min (ref >60)
Glucose, Bld: 175 mg/dL — ABNORMAL HIGH (ref 65–99)
Potassium: 4 mmol/L (ref 3.5–5.1)
SODIUM: 141 mmol/L (ref 135–145)
Total Bilirubin: 0.5 mg/dL (ref 0.3–1.2)
Total Protein: 6.8 g/dL (ref 6.5–8.1)

## 2018-02-19 LAB — CBC
HCT: 44.2 % (ref 35.0–47.0)
Hemoglobin: 14.8 g/dL (ref 12.0–16.0)
MCH: 31.6 pg (ref 26.0–34.0)
MCHC: 33.6 g/dL (ref 32.0–36.0)
MCV: 94 fL (ref 80.0–100.0)
PLATELETS: 266 10*3/uL (ref 150–440)
RBC: 4.7 MIL/uL (ref 3.80–5.20)
RDW: 14 % (ref 11.5–14.5)
WBC: 11.9 10*3/uL — ABNORMAL HIGH (ref 3.6–11.0)

## 2018-02-19 LAB — URINALYSIS, COMPLETE (UACMP) WITH MICROSCOPIC
Bilirubin Urine: NEGATIVE
Glucose, UA: 150 mg/dL — AB
Hgb urine dipstick: NEGATIVE
KETONES UR: NEGATIVE mg/dL
Nitrite: NEGATIVE
PH: 5 (ref 5.0–8.0)
Protein, ur: NEGATIVE mg/dL
SPECIFIC GRAVITY, URINE: 1.021 (ref 1.005–1.030)

## 2018-02-19 LAB — LACTATE DEHYDROGENASE: LDH: 144 U/L (ref 98–192)

## 2018-02-19 LAB — LACTIC ACID, PLASMA: LACTIC ACID, VENOUS: 1.2 mmol/L (ref 0.5–1.9)

## 2018-02-19 LAB — LIPASE, BLOOD: LIPASE: 26 U/L (ref 11–51)

## 2018-02-19 MED ORDER — ONDANSETRON HCL 4 MG/2ML IJ SOLN
4.0000 mg | Freq: Once | INTRAMUSCULAR | Status: AC
  Administered 2018-02-19: 4 mg via INTRAVENOUS
  Filled 2018-02-19: qty 2

## 2018-02-19 MED ORDER — FENTANYL CITRATE (PF) 100 MCG/2ML IJ SOLN
50.0000 ug | INTRAMUSCULAR | Status: DC | PRN
  Administered 2018-02-19: 50 ug via NASAL

## 2018-02-19 MED ORDER — ONDANSETRON 4 MG PO TBDP
4.0000 mg | ORAL_TABLET | Freq: Once | ORAL | Status: AC | PRN
  Administered 2018-02-19: 4 mg via ORAL
  Filled 2018-02-19: qty 1

## 2018-02-19 MED ORDER — MORPHINE SULFATE (PF) 4 MG/ML IV SOLN
4.0000 mg | Freq: Once | INTRAVENOUS | Status: AC
  Administered 2018-02-19: 4 mg via INTRAVENOUS
  Filled 2018-02-19: qty 1

## 2018-02-19 MED ORDER — SODIUM CHLORIDE 0.9 % IV BOLUS (SEPSIS)
1000.0000 mL | Freq: Once | INTRAVENOUS | Status: AC
  Administered 2018-02-19: 1000 mL via INTRAVENOUS

## 2018-02-19 MED ORDER — IOPAMIDOL (ISOVUE-300) INJECTION 61%
100.0000 mL | Freq: Once | INTRAVENOUS | Status: AC | PRN
  Administered 2018-02-19: 100 mL via INTRAVENOUS

## 2018-02-19 MED ORDER — DICYCLOMINE HCL 10 MG PO CAPS: 10.0000 mg | ORAL_CAPSULE | Freq: Three times a day (TID) | ORAL | 0 refills | Status: DC | PRN

## 2018-02-19 MED ORDER — FENTANYL CITRATE (PF) 100 MCG/2ML IJ SOLN
INTRAMUSCULAR | Status: AC
  Administered 2018-02-19: 50 ug via NASAL
  Filled 2018-02-19: qty 2

## 2018-02-19 MED ORDER — IOPAMIDOL (ISOVUE-300) INJECTION 61%
30.0000 mL | Freq: Once | INTRAVENOUS | Status: AC | PRN
  Administered 2018-02-19: 30 mL via ORAL

## 2018-02-19 MED ORDER — ONDANSETRON HCL 4 MG PO TABS: 4.0000 mg | ORAL_TABLET | Freq: Three times a day (TID) | ORAL | 0 refills | Status: AC | PRN

## 2018-02-19 NOTE — ED Provider Notes (Signed)
[facility] Emergency Department Provider Note ____________________________________________   First MD Initiated Contact with Patient 02/19/18 [phone]     (approximate)  I have reviewed the triage vital signs and the nursing notes.   HISTORY  Chief Complaint Abdominal Pain    HPI Debra [name] is a 57 y.o. female with past medical history as noted below including prior history of pancreatitis thought to be medication induced, who presents with diffuse bilateral abdominal pain, primarily periumbilical and in the upper abdomen, gradual onset since yesterday, constant, and associated with nausea but no vomiting.  Patient denies any change in her bowel movements.  She denies any urinary symptoms.  She states it does feel somewhat similar to prior pancreatitis.   Past Medical History:  Diagnosis Date  . Anxiety   . Arthritis   . Depression   . Diabetes mellitus without complication (HCC)    Type II  . Family history of adverse reaction to anesthesia    Mother - BP drops  . Head injury, closed, with brief LOC (HCC) 2011ish  . Hyperlipidemia   . Hypertension   . Insomnia   . Obesity   . Parkinson's disease (HCC)   . Seasonal allergies   . Tobacco abuse     Patient Active Problem List   Diagnosis Date Noted  . Hyperlipidemia 04/20/2017  . Acute low back pain with radicular symptoms, duration less than 6 weeks 08/28/2015  . Lumbar disc disease with radiculopathy 08/20/2015  . Lumbar herniated disc 08/20/2015  . Uncontrolled diabetes mellitus (HCC) 08/20/2015  . Left-sided low back pain with sciatica 06/03/2015  . Bipolar disorder (HCC) 06/03/2015  . Insomnia, persistent 06/03/2015  . BP (high blood pressure) 06/03/2015  . Leg swelling 06/03/2015  . Extreme obesity 06/03/2015  . Compulsive tobacco user syndrome 06/03/2015  . Phlebectasia 06/03/2015    Past Surgical History:  Procedure Laterality Date  . ABDOMINAL HYSTERECTOMY  2012  . BREAST BIOPSY  Right   . CESAREAN SECTION  1992  . CHOLECYSTECTOMY  2013  . LUMBAR LAMINECTOMY/DECOMPRESSION MICRODISCECTOMY Left 09/10/2015   Procedure: Left L4-5 Foraminotomy/Left L5-S1 Diskectomy;  Surgeon: Hilda LiasErnesto Botero, MD;  Location: [facility];  Service: Neurosurgery;  Laterality: Left;  Left L4-5 Foraminotomy/Left L5-S1 Diskectomy    Prior to Admission medications   Medication Sig Start Date End Date Taking? Authorizing Provider  ACCU-CHEK SMARTVIEW test strip  08/18/15   [provider]  Alcohol Swabs (ALCOHOL PREP) PADS  08/18/15   [provider]  aspirin EC 81 MG tablet Take 81 mg by mouth daily.    [provider]  calcium carbonate (OSCAL) 1500 (600 Ca) MG TABS tablet Take by mouth 2 (two) times daily with a meal.    [provider]  Cholecalciferol 5000 units capsule Take 5,000 Units by mouth.     [provider]  dapagliflozin propanediol (FARXIGA) 10 MG TABS tablet Take 1 tablet by mouth every morning. 08/03/17 08/03/18  [provider]  dicyclomine (BENTYL) 10 MG capsule Take 1 capsule (10 mg total) by mouth 3 (three) times daily as needed for up to 3 days for spasms. 02/19/18 02/22/18  Dionne BucySiadecki, Leah Thornberry, MD  folic acid (FOLVITE) 400 MCG tablet Take 800 mcg by mouth daily.     [provider]  glucose blood (ONE TOUCH ULTRA TEST) test strip Check fingerstick blood sugars three times a day; LON 99 months; Dx E11.65 02/15/18   Kerman PasseyLada, Melinda P, MD  hydrochlorothiazide (MICROZIDE) 12.5 MG capsule  Take 1 capsule (12.5 mg total) by mouth daily. 02/15/18   Kerman Passey, MD  Insulin Lispro (HUMALOG KWIKPEN) 200 UNIT/ML SOPN Inject 40 Units into the skin 2 (two) times daily before a meal. Breakfast and lunch, and just 35 units at supper 02/15/18   Lada, Janit Bern, MD  Insulin Pen Needle (BD ULTRA-FINE PEN NEEDLES) 29G X 12.7MM MISC USE THREE TIMES DAILY AS DIRECTED 11/12/17   [provider]  lisinopril (PRINIVIL,ZESTRIL) 40 MG  tablet Take 1 tablet (40 mg total) by mouth daily. 02/15/18   Kerman Passey, MD  Magnesium 400 MG CAPS Take 800 mg by mouth daily.    [provider]  Multiple Vitamins-Minerals (CENTRUM WOMEN) TABS Take 1 tablet by mouth.    [provider]  nystatin (NYSTATIN) powder Apply topically 4 (four) times daily. To affected skin 02/15/18   Lada, Janit Bern, MD  ondansetron (ZOFRAN) 4 MG tablet Take 1 tablet (4 mg total) by mouth every 8 (eight) hours as needed for up to 3 days for nausea or vomiting. 02/19/18 02/22/18  Dionne Bucy, MD  [facility] DELICA LANCETS 33G MISC  02/07/18   [provider]  PARoxetine (PAXIL) 20 MG tablet Take 1 tablet (20 mg total) by mouth daily. 01/28/18   Lada, Janit Bern, MD  propranolol ER (INDERAL LA) 80 MG 24 hr capsule TAKE 1 CAPSULE (80 MG TOTAL) BY MOUTH ONCE DAILY 12/10/17   [provider]  traZODone (DESYREL) 50 MG tablet Take 1 tablet (50 mg total) by mouth at bedtime. 07/08/17   Ellyn Hack, MD  TRESIBA FLEXTOUCH 200 UNIT/ML SOPN Inject 110 Units into the skin at bedtime. 01/28/18   Kerman Passey, MD    Allergies Metformin and related; Benadryl [diphenhydramine hcl]; Canagliflozin; Cholesterol; Depakote er [divalproex sodium er]; Dilaudid [hydromorphone hcl]; Diphenhydramine-zinc acetate; Haloperidol; Hydromorphone; Neosporin [neomycin-bacitracin zn-polymyx]; Singulair [montelukast sodium]; Sitagliptin; Statins; Sulfa antibiotics; and Victoza [liraglutide]  Family History  Problem Relation Age of Onset  . COPD Mother   . Bipolar disorder Father   . Diabetes Mellitus II Father   . Bipolar disorder Brother   . Diabetes Mellitus II Brother   . Bipolar disorder Brother     Social History Social History   Tobacco Use  . Smoking status: Current Every Day Smoker    Packs/day: 0.50    Years: 24.00    Pack years: 12.00    Types: Cigarettes  . Smokeless tobacco: Never Used  Substance Use Topics  . Alcohol use: No     Alcohol/week: 0.0 oz    Comment: occasional- rare  . Drug use: No    Review of Systems  Constitutional: No fever. Eyes: No redness. ENT: No sore throat. Cardiovascular: Denies chest pain. Respiratory: Denies shortness of breath. Gastrointestinal: Positive for abdominal pain.  Genitourinary: Negative for dysuria.  Musculoskeletal: Ne positive for back pain. Skin: Negative for rash. Neurological: Negative for headache.   ____________________________________________   PHYSICAL EXAM:  VITAL SIGNS: ED Triage Vitals  Enc Vitals Group     BP 02/19/18 0027 (!) 130/104     Pulse Rate 02/19/18 0027 78     Resp 02/19/18 0027 (!) 22     Temp 02/19/18 0027 98 F (36.7 C)     Temp Source 02/19/18 0027 Oral     SpO2 02/19/18 0027 97 %     Weight 02/19/18 0028 253 lb (114.8 kg)     Height 02/19/18 0028 5\' 5"  (1.651 m)  Head Circumference --      Peak Flow --      Pain Score 02/19/18 0028 9     Pain Loc --      Pain Edu? --      Excl. in GC? --     Constitutional: Alert and oriented.  Uncomfortable appearing but in no acute distress. Eyes: Conjunctivae are normal.  No scleral icterus Head: Atraumatic. Nose: No congestion/rhinnorhea. Mouth/Throat: Mucous membranes are moist.   Neck: Normal range of motion.  Cardiovascular: Good peripheral circulation. Respiratory: Normal respiratory effort.  No retractions.  Gastrointestinal: Soft with moderate epigastric and bilateral periumbilical tenderness. No distention.  Genitourinary: No flank tenderness. Musculoskeletal:  Extremities warm and well perfused.  Neurologic:  Normal speech and language. No gross focal neurologic deficits are appreciated.  Skin:  Skin is warm and dry. No rash noted. Psychiatric: Mood and affect are normal. Speech and behavior are normal.  ____________________________________________   LABS (all labs ordered are listed, but only abnormal results are displayed)  Labs Reviewed  COMPREHENSIVE METABOLIC  PANEL - Abnormal; Notable for the following components:      Result Value   Glucose, Bld 175 (*)    All other components within normal limits  CBC - Abnormal; Notable for the following components:   WBC 11.9 (*)    All other components within normal limits  URINALYSIS, COMPLETE (UACMP) WITH MICROSCOPIC - Abnormal; Notable for the following components:   Color, Urine YELLOW (*)    APPearance HAZY (*)    Glucose, UA 150 (*)    Leukocytes, UA TRACE (*)    Bacteria, UA RARE (*)    Squamous Epithelial / LPF 0-5 (*)    All other components within normal limits  LIPASE, BLOOD  LACTATE DEHYDROGENASE  LACTIC ACID, PLASMA   ____________________________________________  EKG  ED ECG REPORT I, Dionne Bucy, the attending physician, personally viewed and interpreted this ECG.  Date: 02/19/2018 EKG Time: 351 Rate: 63 Rhythm: normal sinus rhythm QRS Axis: normal Intervals: normal ST/T Wave abnormalities: normal Narrative Interpretation: no evidence of acute ischemia  ____________________________________________  RADIOLOGY  CT abdomen: No evidence of pancreatitis or other acute findings  ____________________________________________   PROCEDURES  Procedure(s) performed: No  Procedures  Critical Care performed: No ____________________________________________   INITIAL IMPRESSION / ASSESSMENT AND PLAN / ED COURSE  Pertinent labs & imaging results that were available during my care of the patient were reviewed by me and considered in my medical decision making (see chart for details).  57 year old female with past medical history as noted above including prior history of medication and precipitated pancreatitis presents with diffuse upper abdominal pain bilaterally associated with nausea but no vomiting, and not associated with diarrhea or urinary symptoms.  No respiratory symptoms.  On exam, the patient is uncomfortable but not acutely ill-appearing, she is  hypertensive, but the other vital signs are normal.  She is tender in the upper abdomen and bilateral in the periumbilical region as described.  Asked medical records reviewed in epic; the patient has not been seen in the ED recently, and her last visit was approximately 9 months ago for unrelated symptoms.  Differential includes pancreatitis, other hepatobiliary cause, gastritis, or given the somewhat diffuse nature of the pain, colitis, diverticulitis or other acute intra-abdominal cause.  We will obtain labs as well as a CT, and treat symptomatically for pain.   ----------------------------------------- 6:02 AM on 02/19/2018 -----------------------------------------  Lab workup is unremarkable except for minimally elevated WBC count.  UA  is negative.  CT does not show any concerning acute findings.  On reassessment, patient is much more comfortable.  She states that she feels well to go home.    I discussed with patient possible causes of her nonspecific abdominal pain given the negative workup, including gastritis, gastroparesis, mild gastroenteritis, or other benign causes.  I also thoroughly discussed return precautions, the patient expressed understanding.  ____________________________________________   FINAL CLINICAL IMPRESSION(S) / ED DIAGNOSES  Final diagnoses:  Abdominal pain, unspecified abdominal location      NEW MEDICATIONS STARTED DURING THIS VISIT:  New Prescriptions   DICYCLOMINE (BENTYL) 10 MG CAPSULE    Take 1 capsule (10 mg total) by mouth 3 (three) times daily as needed for up to 3 days for spasms.   ONDANSETRON (ZOFRAN) 4 MG TABLET    Take 1 tablet (4 mg total) by mouth every 8 (eight) hours as needed for up to 3 days for nausea or vomiting.     Note:  This document was prepared using Dragon voice recognition software and may include unintentional dictation errors.    Dionne Bucy, MD 02/19/18 [phone]

## 2018-02-19 NOTE — Discharge Instructions (Signed)
Return to the emergency department immediately for new, worsening, or persistent severe abdominal pain, vomiting, fevers, weakness, or any other new or worsening symptoms that concern you.  Follow-up with your regular doctor within the next 1-2 weeks.

## 2018-02-19 NOTE — ED Notes (Signed)
Pt complains of"band" like abd pain across bilateral upper quadrants that radiates down to llq and down to anterior left upper lower extremity. Pt states she has nausea, no emesis and no diarrhea. Pt states she has not been passing gas since pain onset. Pt with history of pancreatitis. Skin normal color warm and dry. resps unlabored. Call bell placed at right side.

## 2018-02-19 NOTE — ED Notes (Signed)
Patient transported to CT 

## 2018-02-19 NOTE — ED Triage Notes (Addendum)
Pt comes into the ED via POV c/o abdominal pain that radiates across her entire upper stomach.  Patient grimacing int triage at this time.  Patient has h/o pancreatitis and states this feel very similar.  Patient admits to have nausea but denies any diarrhea.  States the pain has been ongoing for a couple of days and got too bad this evening.  Patient diaphoretic at this time.

## 2018-02-19 NOTE — ED Notes (Signed)
Pt up to commode to void.  

## 2018-02-22 ENCOUNTER — Other Ambulatory Visit: Payer: Self-pay

## 2018-02-22 MED ORDER — PAROXETINE HCL 20 MG PO TABS
20.0000 mg | ORAL_TABLET | Freq: Every day | ORAL | 1 refills | Status: DC
Start: 2018-02-22 — End: 2018-06-13

## 2018-02-22 NOTE — Telephone Encounter (Signed)
90 day supply

## 2018-02-23 DIAGNOSIS — Z1212 Encounter for screening for malignant neoplasm of rectum: Secondary | ICD-10-CM | POA: Diagnosis not present

## 2018-02-23 DIAGNOSIS — Z1211 Encounter for screening for malignant neoplasm of colon: Secondary | ICD-10-CM | POA: Diagnosis not present

## 2018-03-03 LAB — COLOGUARD: COLOGUARD: NEGATIVE

## 2018-03-04 ENCOUNTER — Encounter: Payer: Self-pay | Admitting: Family Medicine

## 2018-03-04 ENCOUNTER — Ambulatory Visit (INDEPENDENT_AMBULATORY_CARE_PROVIDER_SITE_OTHER): Payer: Managed Care, Other (non HMO) | Admitting: Family Medicine

## 2018-03-04 VITALS — BP 156/96 | HR 85 | Temp 98.3°F | Ht 65.25 in | Wt 253.7 lb

## 2018-03-04 DIAGNOSIS — Z794 Long term (current) use of insulin: Secondary | ICD-10-CM | POA: Diagnosis not present

## 2018-03-04 DIAGNOSIS — R42 Dizziness and giddiness: Secondary | ICD-10-CM | POA: Diagnosis not present

## 2018-03-04 DIAGNOSIS — E1165 Type 2 diabetes mellitus with hyperglycemia: Secondary | ICD-10-CM

## 2018-03-04 DIAGNOSIS — I70209 Unspecified atherosclerosis of native arteries of extremities, unspecified extremity: Secondary | ICD-10-CM | POA: Diagnosis not present

## 2018-03-04 DIAGNOSIS — T466X5A Adverse effect of antihyperlipidemic and antiarteriosclerotic drugs, initial encounter: Secondary | ICD-10-CM | POA: Insufficient documentation

## 2018-03-04 DIAGNOSIS — G5603 Carpal tunnel syndrome, bilateral upper limbs: Secondary | ICD-10-CM

## 2018-03-04 DIAGNOSIS — I700 Atherosclerosis of aorta: Secondary | ICD-10-CM

## 2018-03-04 DIAGNOSIS — E7800 Pure hypercholesterolemia, unspecified: Secondary | ICD-10-CM

## 2018-03-04 DIAGNOSIS — E119 Type 2 diabetes mellitus without complications: Secondary | ICD-10-CM | POA: Diagnosis not present

## 2018-03-04 DIAGNOSIS — IMO0002 Reserved for concepts with insufficient information to code with codable children: Secondary | ICD-10-CM | POA: Insufficient documentation

## 2018-03-04 DIAGNOSIS — E1151 Type 2 diabetes mellitus with diabetic peripheral angiopathy without gangrene: Secondary | ICD-10-CM

## 2018-03-04 DIAGNOSIS — I10 Essential (primary) hypertension: Secondary | ICD-10-CM | POA: Diagnosis not present

## 2018-03-04 MED ORDER — EZETIMIBE 10 MG PO TABS: 10.0000 mg | ORAL_TABLET | Freq: Every day | ORAL | 3 refills | Status: DC

## 2018-03-04 MED ORDER — HYDROCHLOROTHIAZIDE 25 MG PO TABS: 25.0000 mg | ORAL_TABLET | Freq: Every day | ORAL | 3 refills | Status: DC

## 2018-03-04 MED ORDER — TRESIBA FLEXTOUCH 200 UNIT/ML ~~LOC~~ SOPN: 130.0000 [IU] | PEN_INJECTOR | Freq: Every day | SUBCUTANEOUS | 1 refills | Status: DC

## 2018-03-04 MED ORDER — PIOGLITAZONE HCL 15 MG PO TABS: 15.0000 mg | ORAL_TABLET | Freq: Every day | ORAL | 0 refills | Status: DC

## 2018-03-04 MED ORDER — INSULIN LISPRO 200 UNIT/ML ~~LOC~~ SOPN: 28.0000 [IU] | PEN_INJECTOR | Freq: Three times a day (TID) | SUBCUTANEOUS | 2 refills | Status: DC

## 2018-03-04 NOTE — Patient Instructions (Addendum)
  Check out [employer] and other websites for ArvinMeritormeatless meal ideas and recipes Try meatless crumbles, Boca burgers, Sweet Earth seitan, [place] vegan sausage mix (all plant protein), Sweet Earth [place] breakfast burritos, etc. You can further limit intake of saturated fats by switching to dairy free products (coconut milk coffee creamer, almond milk, Tofutti brand cream cheese and sour cream, Follow Your Heart vegenaise (mayonnaise alternative) and Follow Your Heart cheese alternative (the [place] Inchesgouda is my favorite) Try cashew or almond or soy milk ice cream  Start the Zetia for cholesterol Try to limit anything that comes from a cow or pig Try to limit saturated fats in your diet (bologna, hot dogs, barbeque, cheeseburgers, hamburgers, steak, bacon, sausage, cheese, etc.) and get more fresh fruits, vegetables, and whole grains  Add actos (pioglitazone) 15 mg daily; if sugars are not to goal in two weeks, increase to 30 mg daily If you develop swelling in your legs or shortness of breath, stop it and call me  Think of healthier ways to deal with stress and try those
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
## 2018-03-04 NOTE — Assessment & Plan Note (Signed)
Adjust medicine; return soon for BP recheck and BMP

## 2018-03-04 NOTE — Assessment & Plan Note (Signed)
Refer to vascular; order ABI; foot exam by MD; suggested she sleep in socks

## 2018-03-04 NOTE — Assessment & Plan Note (Signed)
Intolerant to statin; no myalgia or rhabdo, though

## 2018-03-04 NOTE — Assessment & Plan Note (Signed)
Intolerant to statins; no myalgia, but had syncope/presyncope; she is not willing to take even once a week; will start zetia

## 2018-03-04 NOTE — Assessment & Plan Note (Signed)
Goal LDL under 70 

## 2018-03-12 ENCOUNTER — Other Ambulatory Visit: Payer: Self-pay | Admitting: Family Medicine

## 2018-03-15 ENCOUNTER — Inpatient Hospital Stay: Admission: RE | Admit: 2018-03-15 | Payer: Managed Care, Other (non HMO) | Source: Ambulatory Visit

## 2018-03-16 ENCOUNTER — Ambulatory Visit
Admission: RE | Admit: 2018-03-16 | Discharge: 2018-03-16 | Disposition: A | Discharge status: Home / Self Care | Payer: Managed Care, Other (non HMO) | Source: Ambulatory Visit | Attending: Family Medicine | Admitting: Family Medicine

## 2018-03-16 ENCOUNTER — Other Ambulatory Visit: Payer: Self-pay | Admitting: Family Medicine

## 2018-03-16 ENCOUNTER — Encounter: Payer: Self-pay | Admitting: Family Medicine

## 2018-03-16 DIAGNOSIS — Z780 Asymptomatic menopausal state: Secondary | ICD-10-CM

## 2018-03-16 DIAGNOSIS — M8588 Other specified disorders of bone density and structure, other site: Secondary | ICD-10-CM | POA: Diagnosis not present

## 2018-03-16 DIAGNOSIS — M8580 Other specified disorders of bone density and structure, unspecified site: Secondary | ICD-10-CM | POA: Insufficient documentation

## 2018-03-16 DIAGNOSIS — Z1231 Encounter for screening mammogram for malignant neoplasm of breast: Secondary | ICD-10-CM

## 2018-03-27 ENCOUNTER — Emergency Department
Admission: EM | Admit: 2018-03-27 | Discharge: 2018-03-27 | Disposition: A | Discharge status: Home / Self Care | Payer: Managed Care, Other (non HMO) | Attending: Emergency Medicine | Admitting: Emergency Medicine

## 2018-03-27 ENCOUNTER — Encounter: Payer: Self-pay | Admitting: Emergency Medicine

## 2018-03-27 DIAGNOSIS — L02214 Cutaneous abscess of groin: Secondary | ICD-10-CM | POA: Diagnosis not present

## 2018-03-27 DIAGNOSIS — E119 Type 2 diabetes mellitus without complications: Secondary | ICD-10-CM | POA: Diagnosis not present

## 2018-03-27 DIAGNOSIS — Z794 Long term (current) use of insulin: Secondary | ICD-10-CM | POA: Diagnosis not present

## 2018-03-27 DIAGNOSIS — L03314 Cellulitis of groin: Secondary | ICD-10-CM

## 2018-03-27 DIAGNOSIS — F17210 Nicotine dependence, cigarettes, uncomplicated: Secondary | ICD-10-CM | POA: Diagnosis not present

## 2018-03-27 DIAGNOSIS — R2242 Localized swelling, mass and lump, left lower limb: Secondary | ICD-10-CM | POA: Diagnosis present

## 2018-03-27 DIAGNOSIS — Z79899 Other long term (current) drug therapy: Secondary | ICD-10-CM | POA: Insufficient documentation

## 2018-03-27 DIAGNOSIS — I10 Essential (primary) hypertension: Secondary | ICD-10-CM | POA: Diagnosis not present

## 2018-03-27 DIAGNOSIS — L0291 Cutaneous abscess, unspecified: Secondary | ICD-10-CM

## 2018-03-27 MED ORDER — LIDOCAINE HCL (PF) 1 % IJ SOLN
INTRAMUSCULAR | Status: AC
  Filled 2018-03-27: qty 5

## 2018-03-27 MED ORDER — LIDOCAINE HCL (PF) 1 % IJ SOLN
5.0000 mL | Freq: Once | INTRAMUSCULAR | Status: AC
  Administered 2018-03-27: 5 mL via INTRADERMAL

## 2018-03-27 MED ORDER — HYDROCODONE-ACETAMINOPHEN 5-325 MG PO TABS: 1.0000 | ORAL_TABLET | Freq: Four times a day (QID) | ORAL | 0 refills | Status: DC | PRN

## 2018-03-27 MED ORDER — CLINDAMYCIN HCL 150 MG PO CAPS
300.0000 mg | ORAL_CAPSULE | Freq: Once | ORAL | Status: AC
  Administered 2018-03-27: 300 mg via ORAL
  Filled 2018-03-27: qty 2

## 2018-03-27 MED ORDER — HYDROCODONE-ACETAMINOPHEN 5-325 MG PO TABS
1.0000 | ORAL_TABLET | Freq: Once | ORAL | Status: AC
  Administered 2018-03-27: 1 via ORAL

## 2018-03-27 MED ORDER — HYDROCODONE-ACETAMINOPHEN 5-325 MG PO TABS
ORAL_TABLET | ORAL | Status: AC
  Filled 2018-03-27: qty 1

## 2018-03-27 MED ORDER — CLINDAMYCIN HCL 300 MG PO CAPS: 300.0000 mg | ORAL_CAPSULE | Freq: Three times a day (TID) | ORAL | 0 refills | Status: AC

## 2018-03-27 NOTE — ED Provider Notes (Signed)
[facility] Emergency Department Provider Note  ____________________________________________   First MD Initiated Contact with Patient 03/27/18 [phone]     (approximate)  I have reviewed the triage vital signs and the nursing notes.   HISTORY  Chief Complaint Abscess    HPI Debra [name] is a 57 y.o. female who self presents to the emergency department with 4 days of insidious onset slowly progressive infection to her left groin and under her pannus.  It began with some erythema and a hard central area and now is expressing foul-smelling fluid and is progressing "towards my vagina".  She denies fevers or chills.  She does have a past medical history of diabetes mellitus although her last A1c was 7.2.  Past Medical History:  Diagnosis Date  . Anxiety   . Arthritis   . Depression   . Diabetes mellitus without complication (HCC)    Type II  . Family history of adverse reaction to anesthesia    Mother - BP drops  . Head injury, closed, with brief LOC (HCC) 2011ish  . Hyperlipidemia   . Hypertension   . Insomnia   . Obesity   . Parkinson's disease (HCC)   . Seasonal allergies   . Tobacco abuse     Patient Active Problem List   Diagnosis Date Noted  . Osteopenia 03/16/2018  . Type 2 diabetes with decreased circulation (HCC) 03/04/2018  . Abdominal aortic atherosclerosis (HCC) 03/04/2018  . Adverse reaction to statin medication 03/04/2018  . Type 2 diabetes mellitus treated with insulin (HCC) 03/04/2018  . Uncontrolled diabetes mellitus type 2 with atherosclerosis of arteries of extremities (HCC) 03/04/2018  . Hyperlipidemia 04/20/2017  . Acute low back pain with radicular symptoms, duration less than 6 weeks 08/28/2015  . Lumbar disc disease with radiculopathy 08/20/2015  . Lumbar herniated disc 08/20/2015  . Uncontrolled diabetes mellitus (HCC) 08/20/2015  . Left-sided low back pain with sciatica 06/03/2015  . Bipolar disorder (HCC) 06/03/2015  .  Insomnia, persistent 06/03/2015  . BP (high blood pressure) 06/03/2015  . Leg swelling 06/03/2015  . Extreme obesity 06/03/2015  . Compulsive tobacco user syndrome 06/03/2015  . Phlebectasia 06/03/2015    Past Surgical History:  Procedure Laterality Date  . ABDOMINAL HYSTERECTOMY  2012  . BREAST BIOPSY Right   . CESAREAN SECTION  1992  . CHOLECYSTECTOMY  2013  . LUMBAR LAMINECTOMY/DECOMPRESSION MICRODISCECTOMY Left 09/10/2015   Procedure: Left L4-5 Foraminotomy/Left L5-S1 Diskectomy;  Surgeon: Hilda Lias, MD;  Location: [facility];  Service: Neurosurgery;  Laterality: Left;  Left L4-5 Foraminotomy/Left L5-S1 Diskectomy    Prior to Admission medications   Medication Sig Start Date End Date Taking? Authorizing Provider  ACCU-CHEK SMARTVIEW test strip  08/18/15   [provider]  Alcohol Swabs (ALCOHOL PREP) PADS  08/18/15   [provider]  aspirin EC 81 MG tablet Take 81 mg by mouth daily.    [provider]  calcium carbonate (OSCAL) 1500 (600 Ca) MG TABS tablet Take by mouth 2 (two) times daily with a meal.    [provider]  Cholecalciferol 5000 units capsule Take 5,000 Units by mouth.     [provider]  clindamycin (CLEOCIN) 300 MG capsule Take 1 capsule (300 mg total) by mouth 3 (three) times daily for 10 days. 03/27/18 04/06/18  Merrily Brittle, MD  ezetimibe (ZETIA) 10 MG tablet Take 1 tablet (10 mg total) by mouth daily. 03/04/18   Kerman Passey, MD  folic acid (FOLVITE) 400  MCG tablet Take 800 mcg by mouth daily.     [provider]  glucose blood (ONE TOUCH ULTRA TEST) test strip Check fingerstick blood sugars three times a day; LON 99 months; Dx E11.65 02/15/18   Kerman PasseyLada, Melinda P, MD  hydrochlorothiazide (HYDRODIURIL) 25 MG tablet Take 1 tablet (25 mg total) by mouth daily. 03/04/18   Lada, Janit BernMelinda P, MD  HYDROcodone-acetaminophen (NORCO) 5-325 MG tablet Take 1 tablet by mouth every 6 (six) hours as needed for up to 7  doses for severe pain. 03/27/18   Merrily Brittleifenbark, Twylah Bennetts, MD  Insulin Lispro (HUMALOG KWIKPEN) 200 UNIT/ML SOPN Inject 28 Units into the skin 3 (three) times daily with meals. Breakfast and lunch, and just 35 units at supper 03/04/18   Lada, Janit BernMelinda P, MD  Insulin Pen Needle (BD ULTRA-FINE PEN NEEDLES) 29G X 12.7MM MISC USE THREE TIMES DAILY AS DIRECTED 11/12/17   [provider]  lisinopril (PRINIVIL,ZESTRIL) 40 MG tablet Take 1 tablet (40 mg total) by mouth daily. 02/15/18   Kerman PasseyLada, Melinda P, MD  Magnesium 400 MG CAPS Take 800 mg by mouth daily.    [provider]  Multiple Vitamins-Minerals (CENTRUM WOMEN) TABS Take 1 tablet by mouth.    [provider]  nystatin (NYSTATIN) powder Apply topically 4 (four) times daily. To affected skin 02/15/18   Lada, Janit BernMelinda P, MD  [facility] IncNETOUCH DELICA LANCETS 33G MISC  02/07/18   [provider]  PARoxetine (PAXIL) 20 MG tablet Take 1 tablet (20 mg total) by mouth daily. 02/22/18   Lada, Janit BernMelinda P, MD  pioglitazone (ACTOS) 15 MG tablet Take 1 tablet (15 mg total) by mouth daily. 03/04/18   Lada, Janit BernMelinda P, MD  propranolol ER (INDERAL LA) 80 MG 24 hr capsule TAKE 1 CAPSULE (80 MG TOTAL) BY MOUTH ONCE DAILY 12/10/17   [provider]  traZODone (DESYREL) 50 MG tablet Take 1 tablet (50 mg total) by mouth at bedtime. 07/08/17   Ellyn HackShah, Syed Asad A, MD  TRESIBA FLEXTOUCH 200 UNIT/ML SOPN Inject 130 Units into the skin at bedtime. 03/04/18   Kerman PasseyLada, Melinda P, MD    Allergies Metformin and related; Benadryl [diphenhydramine hcl]; Canagliflozin; Cholesterol; Depakote er [divalproex sodium er]; Dilaudid [hydromorphone hcl]; Diphenhydramine-zinc acetate; Haloperidol; Hydromorphone; Neosporin [neomycin-bacitracin zn-polymyx]; Singulair [montelukast sodium]; Sitagliptin; Statins; Sulfa antibiotics; and Victoza [liraglutide]  Family History  Problem Relation Age of Onset  . COPD Mother   . Bipolar disorder Father   . Diabetes Mellitus II Father   .  Bipolar disorder Brother   . Diabetes Mellitus II Brother   . Bipolar disorder Brother     Social History Social History   Tobacco Use  . Smoking status: Current Every Day Smoker    Packs/day: 0.50    Years: 24.00    Pack years: 12.00    Types: Cigarettes  . Smokeless tobacco: Never Used  Substance Use Topics  . Alcohol use: No    Alcohol/week: 0.0 oz    Comment: occasional- rare  . Drug use: No    Review of Systems Constitutional: No fever/chills ENT: No sore throat. Cardiovascular: Denies chest pain. Respiratory: Denies shortness of breath. Gastrointestinal: No abdominal pain.  No nausea, no vomiting.  No diarrhea.  No constipation. Musculoskeletal: Negative for back pain. Neurological: Negative for headaches   ____________________________________________   PHYSICAL EXAM:  VITAL SIGNS: ED Triage Vitals  Enc Vitals Group     BP 03/27/18 0515 140/78     Pulse Rate 03/27/18 0515 79  Resp 03/27/18 0515 18     Temp 03/27/18 0515 98.3 F (36.8 C)     Temp Source 03/27/18 0515 Oral     SpO2 03/27/18 0515 96 %     Weight 03/27/18 0514 250 lb (113.4 kg)     Height 03/27/18 0514 5\' 5"  (1.651 m)     Head Circumference --      Peak Flow --      Pain Score 03/27/18 0514 9     Pain Loc --      Pain Edu? --      Excl. in GC? --     Constitutional: Alert and oriented x4 joking laughing well-appearing nontoxic no diaphoresis speaks in full clear sentences Head: Atraumatic. Nose: No congestion/rhinnorhea. Mouth/Throat: No trismus Neck: No stridor.   Cardiovascular: Regular rate and rhythm Respiratory: Normal respiratory effort.  No retractions. Gastrointestinal: Morbidly obese soft nontender Neurologic:  Normal speech and language. No gross focal neurologic deficits are appreciated.  Skin: Abscess with cellulitis underneath her pannus on the left.  Cellulitis for about 8 cm and a roughly 4 cm indurated area with a fluctuant core and necrotic center expressing  foul-smelling purulent material    ____________________________________________  LABS (all labs ordered are listed, but only abnormal results are displayed)  Labs Reviewed - No data to display   __________________________________________  EKG   ____________________________________________  RADIOLOGY   ____________________________________________   DIFFERENTIAL includes but not limited to  Cellulitis, abscess, necrotizing soft tissue infection, panniculitis   PROCEDURES  Procedure(s) performed: Yes  .Marland KitchenIncision and Drainage Date/Time: 03/27/2018 6:44 AM Performed by: Merrily Brittle, MD Authorized by: Merrily Brittle, MD   Consent:    Consent obtained:  Verbal   Consent given by:  Patient   Risks discussed:  Bleeding, incomplete drainage, pain and infection   Alternatives discussed:  No treatment and alternative treatment Location:    Type:  Abscess   Size:  5cm   Location:  Trunk   Trunk location:  Abdomen Pre-procedure details:    Skin preparation:  Antiseptic wash Anesthesia (see MAR for exact dosages):    Anesthesia method:  Local infiltration   Local anesthetic:  Lidocaine 1% w/o epi Procedure type:    Complexity:  Complex Procedure details:    Incision types:  Elliptical   Incision depth:  Dermal   Scalpel blade:  11   Wound management:  Probed and deloculated and debrided   Drainage:  Purulent   Drainage amount:  Moderate   Wound treatment:  Wound left open   Packing materials:  None Post-procedure details:    Patient tolerance of procedure:  Tolerated well, no immediate complications    Critical Care performed: no  Observation: no ____________________________________________   INITIAL IMPRESSION / ASSESSMENT AND PLAN / ED COURSE  Pertinent labs & imaging results that were available during my care of the patient were reviewed by me and considered in my medical decision making (see chart for details).  The patient arrives  hemodynamically stable and well-appearing.  She has obvious panniculitis causing cellulitis and abscess.  It is already draining foul-smelling fluid and has a necrotic core.  Patient verbally consented for incision and drainage and performed with 1% lidocaine without epinephrine and an 11 blade needle expressing a moderate amount of foul-smelling purulent material.  I then used scissors to sharp to provide all of the necrotic tissue that I could find.  Wound left open and not packed.  She is unfortunately allergic to sulfa antibiotics I will  cover her for MRSA with clindamycin instead and refer her to wound care clinic in 2 days.  Strict return precautions have been given and the patient verbalizes understanding and agreement with the plan.      ____________________________________________   FINAL CLINICAL IMPRESSION(S) / ED DIAGNOSES  Final diagnoses:  Abscess  Cellulitis of groin      NEW MEDICATIONS STARTED DURING THIS VISIT:  New Prescriptions   CLINDAMYCIN (CLEOCIN) 300 MG CAPSULE    Take 1 capsule (300 mg total) by mouth 3 (three) times daily for 10 days.   HYDROCODONE-ACETAMINOPHEN (NORCO) 5-325 MG TABLET    Take 1 tablet by mouth every 6 (six) hours as needed for up to 7 doses for severe pain.     Note:  This document was prepared using Dragon voice recognition software and may include unintentional dictation errors.      Merrily Brittle, MD 03/27/18 [phone]

## 2018-03-27 NOTE — ED Notes (Signed)
Pt ambulatory to wheel chair upon discharge. This RN helped pt transfer to car. Verbalized understanding of discharge instructions, follow-up care and prescriptions. VSS. Skin warm and dry. Tegaderm applied to abscess prior to discharge.

## 2018-03-27 NOTE — Discharge Instructions (Signed)
Please take all of your antibiotics as prescribed and follow-up in the wound care clinic in 2 days for a recheck.  Return to the emergency department for any new or worsening symptoms such as worsening pain, fevers, or for any other concerns whatsoever.  It was a pleasure to take care of you today, and thank you for coming to our emergency department.  If you have any questions or concerns before leaving please ask the nurse to grab me and I'm more than happy to go through your aftercare instructions again.  If you were prescribed any opioid pain medication today such as Norco, Vicodin, Percocet, morphine, hydrocodone, or oxycodone please make sure you do not drive when you are taking this medication as it can alter your ability to drive safely.  If you have any concerns once you are home that you are not improving or are in fact getting worse before you can make it to your follow-up appointment, please do not hesitate to call 911 and come back for further evaluation.  Merrily BrittleNeil Larhonda Dettloff, MD

## 2018-03-27 NOTE — ED Triage Notes (Signed)
Patient reports abscess to left groin that started on Wednesday. Patient states that the area has become larger and that it started draining on Friday.

## 2018-03-29 ENCOUNTER — Encounter: Payer: Self-pay | Admitting: Family Medicine

## 2018-03-29 ENCOUNTER — Ambulatory Visit (INDEPENDENT_AMBULATORY_CARE_PROVIDER_SITE_OTHER): Payer: Managed Care, Other (non HMO) | Admitting: Family Medicine

## 2018-03-29 VITALS — BP 136/74 | HR 82 | Temp 98.3°F | Resp 16 | Ht 65.0 in | Wt 252.2 lb

## 2018-03-29 DIAGNOSIS — I70209 Unspecified atherosclerosis of native arteries of extremities, unspecified extremity: Secondary | ICD-10-CM

## 2018-03-29 DIAGNOSIS — E1165 Type 2 diabetes mellitus with hyperglycemia: Secondary | ICD-10-CM | POA: Diagnosis not present

## 2018-03-29 DIAGNOSIS — E7800 Pure hypercholesterolemia, unspecified: Secondary | ICD-10-CM | POA: Diagnosis not present

## 2018-03-29 DIAGNOSIS — L02214 Cutaneous abscess of groin: Secondary | ICD-10-CM | POA: Diagnosis not present

## 2018-03-29 DIAGNOSIS — T466X5A Adverse effect of antihyperlipidemic and antiarteriosclerotic drugs, initial encounter: Secondary | ICD-10-CM

## 2018-03-29 DIAGNOSIS — M791 Myalgia, unspecified site: Secondary | ICD-10-CM

## 2018-03-29 DIAGNOSIS — IMO0002 Reserved for concepts with insufficient information to code with codable children: Secondary | ICD-10-CM

## 2018-03-29 DIAGNOSIS — I10 Essential (primary) hypertension: Secondary | ICD-10-CM | POA: Diagnosis not present

## 2018-03-29 DIAGNOSIS — E1151 Type 2 diabetes mellitus with diabetic peripheral angiopathy without gangrene: Secondary | ICD-10-CM

## 2018-03-29 DIAGNOSIS — F17200 Nicotine dependence, unspecified, uncomplicated: Secondary | ICD-10-CM | POA: Diagnosis not present

## 2018-03-29 HISTORY — DX: Myalgia, unspecified site: M79.10

## 2018-03-29 HISTORY — DX: Adverse effect of antihyperlipidemic and antiarteriosclerotic drugs, initial encounter: T46.6X5A

## 2018-03-29 MED ORDER — PIOGLITAZONE HCL 30 MG PO TABS: 30.0000 mg | ORAL_TABLET | Freq: Every day | ORAL | 3 refills | Status: DC

## 2018-03-29 MED ORDER — COLESEVELAM HCL 3.75 G PO PACK: 0.5000 | PACK | Freq: Two times a day (BID) | ORAL | 2 refills | Status: DC

## 2018-03-29 NOTE — Assessment & Plan Note (Signed)
Slightly higher than ideal, but patient has been in pain and has abscess; nothing extra salty

## 2018-03-29 NOTE — Assessment & Plan Note (Signed)
Did not tolerate statin

## 2018-03-29 NOTE — Patient Instructions (Addendum)
Check out [employer] and other websites for ArvinMeritormeatless meal ideas and recipes Try meatless crumbles, Boca burgers, Sweet Earth seitan, [place] vegan sausage mix (all plant protein), Sweet Earth [place] breakfast burritos, etc. You can further limit intake of saturated fats by switching to dairy free products (almond and coconut milk coffee creamer, almond milk, Tofutti brand cream cheese and sour cream, Follow Your Heart vegenaise (mayonnaise alternative) and Follow Your Heart cheese alternative (the [place] Inchesgouda is my favorite) HoneywellChao cheese alternative They even make almond and cashew and soy milk ice cream Don't use hydrocortisone cream on the wound Very mildly damp "wet to dry dressing" if the dry dressing is too painful, then just use dry gauze; not soaking wet by any means Start the Falcon HeightsWelchol, twice a day; if abdominal bloating then cut back See wound doctor tomorrow

## 2018-03-29 NOTE — Assessment & Plan Note (Signed)
Did not tolerate statins; did not tolerate zetia; will try welchol

## 2018-03-29 NOTE — Assessment & Plan Note (Signed)
Increase actos to 30 mg; reviewed sugar readings with patient

## 2018-03-29 NOTE — Assessment & Plan Note (Signed)
Glad that patient is working on decreasing his tobacco use

## 2018-03-29 NOTE — Progress Notes (Signed)
BP 136/74   Pulse 82   Temp 98.3 F (36.8 C) (Oral)   Resp 16   Ht 5\' 5"  (1.651 m)   Wt 252 lb 3.2 oz (114.4 kg)   SpO2 95%   BMI 41.97 kg/m    Subjective:    Patient ID: Debra [name], female    DOB: May 13, 1961, 57 y.o.   MRN: [id number]  HPI: Debra [name] is a 57 y.o. female  Chief Complaint  Patient presents with  . Follow-up    HPI  Patient is here for f/u Type 2 DM Highest sugar was 353 without insulin and food indiscretion Today's FSBS 150, yesterday was 118, 75, 198, 118 She is doing well on the Actos and would like to go up  Got a boil on Wednesday; went to the ER and they did an I&D; it broke on Saturday and she went to the ER on 03/27/18 (Sunday) when she was vomiting; no fever She goes to the wound clinic tomorrow; she thinks it is looking good  HTN; no missed dose, no extra salt  Doing better with smoking; has to go outside to smoke now, she and her husband are working on this together  High cholesterol; did not tolerate zetia, muscle aches all over; cannot tolerate statin  Depression screen [facility] 2/9 03/29/2018 03/04/2018 02/15/2018 01/28/2018 10/20/2017  Decreased Interest 0 0 0 0 0  Down, Depressed, Hopeless 0 0 0 0 0  PHQ - 2 Score 0 0 0 0 0    Relevant past medical, surgical, family and social history reviewed Past Medical History:  Diagnosis Date  . Anxiety   . Arthritis   . Depression   . Diabetes mellitus without complication (HCC)    Type II  . Family history of adverse reaction to anesthesia    Mother - BP drops  . Head injury, closed, with brief LOC (HCC) 2011ish  . Hyperlipidemia   . Hypertension   . Insomnia   . Myalgia 03/29/2018   Due to statin  . Obesity   . Parkinson's disease (HCC)   . Seasonal allergies   . Tobacco abuse    Past Surgical History:  Procedure Laterality Date  . ABDOMINAL HYSTERECTOMY  2012  . BREAST BIOPSY Right   . CESAREAN SECTION  1992  . CHOLECYSTECTOMY  2013  . LUMBAR LAMINECTOMY/DECOMPRESSION  MICRODISCECTOMY Left 09/10/2015   Procedure: Left L4-5 Foraminotomy/Left L5-S1 Diskectomy;  Surgeon: Hilda Lias, MD;  Location: [facility];  Service: Neurosurgery;  Laterality: Left;  Left L4-5 Foraminotomy/Left L5-S1 Diskectomy   Family History  Problem Relation Age of Onset  . COPD Mother   . Bipolar disorder Father   . Diabetes Mellitus II Father   . Bipolar disorder Brother   . Diabetes Mellitus II Brother   . Bipolar disorder Brother    Social History   Tobacco Use  . Smoking status: Current Every Day Smoker    Packs/day: 0.50    Years: 24.00    Pack years: 12.00    Types: Cigarettes  . Smokeless tobacco: Never Used  Substance Use Topics  . Alcohol use: Yes    Alcohol/week: 0.0 oz    Comment: occasional- rare  . Drug use: No    Interim medical history since last visit reviewed. Allergies and medications reviewed  Review of Systems Per HPI unless specifically indicated above     Objective:    BP 136/74   Pulse 82   Temp 98.3 F (36.8 C) (Oral)  Resp 16   Ht 5\' 5"  (1.651 m)   Wt 252 lb 3.2 oz (114.4 kg)   SpO2 95%   BMI 41.97 kg/m   Wt Readings from Last 3 Encounters:  03/29/18 252 lb 3.2 oz (114.4 kg)  03/27/18 250 lb (113.4 kg)  03/04/18 253 lb 11.2 oz (115.1 kg)    Physical Exam  Constitutional: She appears well-developed and well-nourished. No distress.  HENT:  Head: Normocephalic and atraumatic.  Eyes: EOM are normal. No scleral icterus.  Neck: No thyromegaly present.  Cardiovascular: Normal rate, regular rhythm and normal heart sounds.  No murmur heard. Pulmonary/Chest: Effort normal and breath sounds normal. No respiratory distress. She has no wheezes.  Abdominal: Soft. Bowel sounds are normal. She exhibits no distension.  Musculoskeletal: Normal range of motion. She exhibits no edema.  Neurological: She is alert. She exhibits normal muscle tone.  Skin: Skin is warm and dry. She is not diaphoretic. No pallor.     Gaping open wound  with necrosis at the distal end, foul purulent material; mild   Psychiatric: She has a normal mood and affect. Her behavior is normal. Judgment and thought content normal.   Diabetic Foot Form - Detailed   Diabetic Foot Exam - detailed Diabetic Foot exam was performed with the following findings:  Yes 03/29/2018  1:45 PM  Visual Foot Exam completed.:  Yes  Pulse Foot Exam completed.:  Yes  Right Dorsalis Pedis:  Present Left Dorsalis Pedis:  Present  Sensory Foot Exam Completed.:  Yes Semmes-Weinstein Monofilament Test R Site 1-Great Toe:  Pos L Site 1-Great Toe:  Pos         Results for orders placed or performed in visit on 03/03/18  Cologuard  Result Value Ref Range   Cologuard Negative       Assessment & Plan:   Problem List Items Addressed This Visit      Cardiovascular and Mediastinum   Uncontrolled diabetes mellitus type 2 with atherosclerosis of arteries of extremities (HCC)    Increase actos to 30 mg; reviewed sugar readings with patient      Relevant Medications   pioglitazone (ACTOS) 30 MG tablet   Colesevelam HCl 3.75 g PACK   BP (high blood pressure)    Slightly higher than ideal, but patient has been in pain and has abscess; nothing extra salty      Relevant Medications   Colesevelam HCl 3.75 g PACK     Other   Myalgia    Did not tolerate statin      Hyperlipidemia    Did not tolerate statins; did not tolerate zetia; will try welchol      Relevant Medications   Colesevelam HCl 3.75 g PACK   Compulsive tobacco user syndrome    Glad that patient is working on decreasing his tobacco use       Other Visit Diagnoses    Abscess of groin, left    -  Primary   continue antibiotics; suggested probiotic; keep covered with gauze, may do dry or very mildly damp wet to dry; see wound care tomorrow       Follow up plan: No follow-ups on file.  An after-visit summary was printed and given to the patient at check-out.  Please see the patient  instructions which may contain other information and recommendations beyond what is mentioned above in the assessment and plan.  Meds ordered this encounter  Medications  . pioglitazone (ACTOS) 30 MG tablet    Sig: Take  1 tablet (30 mg total) by mouth daily.    Dispense:  90 tablet    Refill:  3    We are increasing the dose, cancel the 15 mg refills  . Colesevelam HCl 3.75 g PACK    Sig: Take 0.5 packets by mouth 2 (two) times daily with a meal. Mix in 4-8 ounces of water, fruit juice, or diet soft drink; for chol and diabetes    Dispense:  30 each    Refill:  2    No orders of the defined types were placed in this encounter.

## 2018-03-30 ENCOUNTER — Encounter: Payer: Managed Care, Other (non HMO) | Attending: Internal Medicine | Admitting: Internal Medicine

## 2018-03-30 ENCOUNTER — Other Ambulatory Visit
Admission: RE | Admit: 2018-03-30 | Discharge: 2018-03-30 | Disposition: A | Discharge status: Home / Self Care | Payer: Managed Care, Other (non HMO) | Source: Ambulatory Visit | Attending: Internal Medicine | Admitting: Internal Medicine

## 2018-03-30 DIAGNOSIS — E11610 Type 2 diabetes mellitus with diabetic neuropathic arthropathy: Secondary | ICD-10-CM | POA: Insufficient documentation

## 2018-03-30 DIAGNOSIS — Y839 Surgical procedure, unspecified as the cause of abnormal reaction of the patient, or of later complication, without mention of misadventure at the time of the procedure: Secondary | ICD-10-CM | POA: Diagnosis not present

## 2018-03-30 DIAGNOSIS — Z6838 Body mass index (BMI) 38.0-38.9, adult: Secondary | ICD-10-CM | POA: Diagnosis not present

## 2018-03-30 DIAGNOSIS — L02214 Cutaneous abscess of groin: Secondary | ICD-10-CM | POA: Diagnosis not present

## 2018-03-30 DIAGNOSIS — Z885 Allergy status to narcotic agent status: Secondary | ICD-10-CM | POA: Diagnosis not present

## 2018-03-30 DIAGNOSIS — J449 Chronic obstructive pulmonary disease, unspecified: Secondary | ICD-10-CM | POA: Diagnosis not present

## 2018-03-30 DIAGNOSIS — G20 Parkinson's disease: Secondary | ICD-10-CM | POA: Diagnosis not present

## 2018-03-30 DIAGNOSIS — F319 Bipolar disorder, unspecified: Secondary | ICD-10-CM | POA: Insufficient documentation

## 2018-03-30 DIAGNOSIS — L02211 Cutaneous abscess of abdominal wall: Secondary | ICD-10-CM | POA: Insufficient documentation

## 2018-03-30 DIAGNOSIS — T8131XD Disruption of external operation (surgical) wound, not elsewhere classified, subsequent encounter: Secondary | ICD-10-CM | POA: Insufficient documentation

## 2018-03-30 DIAGNOSIS — E669 Obesity, unspecified: Secondary | ICD-10-CM | POA: Diagnosis not present

## 2018-03-30 DIAGNOSIS — Z794 Long term (current) use of insulin: Secondary | ICD-10-CM | POA: Diagnosis not present

## 2018-03-30 DIAGNOSIS — E11622 Type 2 diabetes mellitus with other skin ulcer: Secondary | ICD-10-CM | POA: Diagnosis not present

## 2018-03-30 DIAGNOSIS — E11621 Type 2 diabetes mellitus with foot ulcer: Secondary | ICD-10-CM | POA: Diagnosis present

## 2018-03-30 DIAGNOSIS — I890 Lymphedema, not elsewhere classified: Secondary | ICD-10-CM | POA: Diagnosis not present

## 2018-03-30 DIAGNOSIS — I10 Essential (primary) hypertension: Secondary | ICD-10-CM | POA: Insufficient documentation

## 2018-03-30 DIAGNOSIS — Z888 Allergy status to other drugs, medicaments and biological substances status: Secondary | ICD-10-CM | POA: Diagnosis not present

## 2018-03-30 DIAGNOSIS — Z882 Allergy status to sulfonamides status: Secondary | ICD-10-CM | POA: Diagnosis not present

## 2018-03-30 DIAGNOSIS — B999 Unspecified infectious disease: Secondary | ICD-10-CM | POA: Diagnosis not present

## 2018-04-02 LAB — AEROBIC CULTURE  (SUPERFICIAL SPECIMEN)

## 2018-04-02 LAB — AEROBIC CULTURE W GRAM STAIN (SUPERFICIAL SPECIMEN)

## 2018-04-05 NOTE — Progress Notes (Signed)
MEDRITH, VEILLON ([id number]) Visit Report for 03/30/2018 Allergy List Details Patient Name: Debra [name], Debra [name]. Date of Service: 03/30/2018 9:45 AM Medical Record Number: [id number] Patient Account Number: [id number] Date of Birth/Sex: 12-22-1960 (57 y.o. Female) Treating RN: Phillis Haggis Primary Care Gurkirat Basher: Baruch Gouty Other Clinician: Referring Kenderick Kobler: Merrily Brittle Treating Harun Brumley/Extender: Maxwell Caul Weeks in Treatment: 0 Allergies Active Allergies Benadryl canagliflozin Depakote Dilaudid haloperidol hydromorphone Neosporin (neo-bac-polym) Singulair Statins-Hmg-Coa Reductase Inhibitors sitagliptin sulfa Victoza Allergy Notes Electronic Signature(s) Signed: 04/01/2018 4:29:16 PM By: Alejandro Mulling Entered By: Alejandro Mulling on 03/30/2018 09:58:48 Debra [name] ([id number]) -------------------------------------------------------------------------------- Arrival Information Details Patient Name: Debra [name]. Date of Service: 03/30/2018 9:45 AM Medical Record Number: [id number] Patient Account Number: [id number] Date of Birth/Sex: [birth year] (57 y.o. Female) Treating RN: Phillis Haggis Primary Care Garik Diamant: Baruch Gouty Other Clinician: Referring Barry Culverhouse: Merrily Brittle Treating Laiyah Exline/Extender: Altamese [place] in Treatment: 0 Visit Information Patient Arrived: Ambulatory Arrival Time: 09:53 Accompanied By: husband Transfer Assistance: None Patient Identification Verified: Yes Secondary Verification Process Completed: Yes Patient Requires Transmission-Based No Precautions: Patient Has Alerts: Yes Patient Alerts: DM II Electronic Signature(s) Signed: 04/01/2018 4:29:16 PM By: Alejandro Mulling Entered By: Alejandro Mulling on 03/30/2018 09:53:35 Debra [name] ([id number]) -------------------------------------------------------------------------------- Clinic Level of Care Assessment Details Patient Name: Debra [name] Date of Service:  03/30/2018 9:45 AM Medical Record Number: [id number] Patient Account Number: [id number] Date of Birth/Sex: 11-25-1961 (57 y.o. Female) Treating RN: Huel Coventry Primary Care Peightyn Roberson: Baruch Gouty Other Clinician: Referring Ahlana Slaydon: Merrily Brittle Treating Lumir Demetriou/Extender: Altamese [place] in Treatment: 0 Clinic Level of Care Assessment Items TOOL 1 Quantity Score  - Use when EandM and Procedure is performed on INITIAL visit 0 ASSESSMENTS - Nursing Assessment / Reassessment X - General Physical Exam (combine w/ comprehensive assessment (listed just below) when 1 20 performed on new pt. evals) X- 1 25 Comprehensive Assessment (HX, ROS, Risk Assessments, Wounds Hx, etc.) ASSESSMENTS - Wound and Skin Assessment / Reassessment  - Dermatologic / Skin Assessment (not related to wound area) 0 ASSESSMENTS - Ostomy and/or Continence Assessment and Care  - Incontinence Assessment and Management 0  - 0 Ostomy Care Assessment and Management (repouching, etc.) PROCESS - Coordination of Care X - Simple Patient / Family Education for ongoing care 1 15  - 0 Complex (extensive) Patient / Family Education for ongoing care X- 1 10 Staff obtains Chiropractor, Records, Test Results / Process Orders  - 0 Staff telephones HHA, Nursing Homes / Clarify orders / etc  - 0 Routine Transfer to another Facility (non-emergent condition)  - 0 Routine Hospital Admission (non-emergent condition) X- 1 15 New Admissions / Manufacturing engineer / Ordering NPWT, Apligraf, etc.  - 0 Emergency Hospital Admission (emergent condition) PROCESS - Special Needs  - Pediatric / Minor Patient Management 0  - 0 Isolation Patient Management  - 0 Hearing / Language / Visual special needs  - 0 Assessment of Community assistance (transportation, D/C planning, etc.)  - 0 Additional assistance / Altered mentation  - 0 Support Surface(s) Assessment (bed, cushion, seat, etc.) KITRINA, MAURIN ([id number]) INTERVENTIONS - Miscellaneous  - External ear exam 0  - 0 Patient Transfer (multiple staff / Nurse, adult / Similar devices)  - 0 Simple Staple / Suture removal (25 or less)  - 0 Complex Staple / Suture removal (26 or more)  - 0 Hypo/Hyperglycemic Management (do not check if billed separately)  - 0 Ankle / Brachial Index (ABI) - do not check if billed separately  Has the patient been seen at the hospital within the last three years: Yes Total Score: 85 Level Of Care: New/Established - Level 3 Electronic Signature(s) Signed: 03/30/2018 12:27:24 PM By: Elliot Gurney, BSN, RN, CWS, Annelyse RN, BSN Entered By: Elliot Gurney, BSN, RN, CWS, Rocky on 03/30/2018 10:38:28 Debra [name] ([id number]) -------------------------------------------------------------------------------- Encounter Discharge Information Details Patient Name: Debra [name] Date of Service: 03/30/2018 9:45 AM Medical Record Number: [id number] Patient Account Number: [id number] Date of Birth/Sex: [birth year] (57 y.o. Female) Treating RN: Huel Coventry Primary Care Rodgers Likes: Baruch Gouty Other Clinician: Referring Trula Frede: Merrily Brittle Treating Jossie Smoot/Extender: Altamese [place] in Treatment: 0 Encounter Discharge Information Items Discharge Pain Level: 0 Discharge Condition: Stable Ambulatory Status: Ambulatory Discharge Destination: Home Transportation: Private Auto Accompanied By: spouse Schedule Follow-up Appointment: Yes Medication Reconciliation completed and No provided to Patient/Care Marjarie Irion: Provided on Clinical Summary of Care: 03/30/2018 Form Type Recipient Paper Patient KK Electronic Signature(s) Signed: 03/30/2018 12:51:59 PM By: Curtis Sites Entered By: Curtis Sites on 03/30/2018 12:51:58 Debra [name] ([id number]) -------------------------------------------------------------------------------- Lower Extremity Assessment Details Patient Name: Debra [name]. Date of Service: 03/30/2018  9:45 AM Medical Record Number: [id number] Patient Account Number: [id number] Date of Birth/Sex: 1961-08-29 (57 y.o. Female) Treating RN: Phillis Haggis Primary Care Rieley Khalsa: Baruch Gouty Other Clinician: Referring Shahil Speegle: Merrily Brittle Treating Amore Grater/Extender: Maxwell Caul Weeks in Treatment: 0 Electronic Signature(s) Signed: 04/01/2018 4:29:16 PM By: Alejandro Mulling Entered By: Alejandro Mulling on 03/30/2018 09:55:11 Debra [name] ([id number]) -------------------------------------------------------------------------------- Multi Wound Chart Details Patient Name: Debra [name] Date of Service: 03/30/2018 9:45 AM Medical Record Number: [id number] Patient Account Number: [id number] Date of Birth/Sex: March 17, 1961 (57 y.o. Female) Treating RN: Huel Coventry Primary Care Rocio Wolak: Baruch Gouty Other Clinician: Referring Kevion Fatheree: Merrily Brittle Treating Satya Bohall/Extender: Altamese [place] in Treatment: 0 Vital Signs Height(in): 65 Pulse(bpm): 70 Weight(lbs): 230 Blood Pressure(mmHg): 154/90 Body Mass Index(BMI): 38 Temperature(F): 98.2 Respiratory Rate 18 (breaths/min): Photos: [1:No Photos] [N/A:N/A] Wound Location: [1:Left Groin] [N/A:N/A] Wounding Event: [1:Gradually Appeared] [N/A:N/A] Primary Etiology: [1:Diabetic Wound/Ulcer of the Lower Extremity] [N/A:N/A] Secondary Etiology: [1:Abscess] [N/A:N/A] Comorbid History: [1:Hypertension, Type II Diabetes, Osteoarthritis] [N/A:N/A] Date Acquired: [1:03/23/2018] [N/A:N/A] Weeks of Treatment: [1:0] [N/A:N/A] Wound Status: [1:Open] [N/A:N/A] Measurements L x W x D [1:2x3.7x0.9] [N/A:N/A] (cm) Area (cm) : [1:5.812] [N/A:N/A] Volume (cm) : [1:5.231] [N/A:N/A] % Reduction in Area: [1:0.00%] [N/A:N/A] % Reduction in Volume: [1:0.00%] [N/A:N/A] Position 1 (o'clock): [1:7] Maximum Distance 1 (cm): [1:3] Tunneling: [1:Yes] [N/A:N/A] Classification: [1:Grade 1] [N/A:N/A] Exudate Amount: [1:Large]  [N/A:N/A] Exudate Type: [1:Serosanguineous] [N/A:N/A] Exudate Color: [1:red, brown] [N/A:N/A] Wound Margin: [1:Distinct, outline attached] [N/A:N/A] Granulation Amount: [1:Medium (34-66%)] [N/A:N/A] Granulation Quality: [1:Red] [N/A:N/A] Necrotic Amount: [1:Medium (34-66%)] [N/A:N/A] Necrotic Tissue: [1:Eschar, Adherent Slough] [N/A:N/A] Exposed Structures: [1:Fascia: No Fat Layer (Subcutaneous Tissue) Exposed: No Tendon: No Muscle: No Joint: No Bone: No] [N/A:N/A] Epithelialization: [1:None] [N/A:N/A] Debridement: Debridement - Excisional N/A N/A Pre-procedure 10:35 N/A N/A Verification/Time Out Taken: Pain Control: Other N/A N/A Tissue Debrided: Subcutaneous, Slough N/A N/A Level: Skin/Subcutaneous Tissue N/A N/A Debridement Area (sq cm): 7.4 N/A N/A Instrument: Curette, Scissors N/A N/A Specimen: Swab N/A N/A Number of Specimens 1 N/A N/A Taken: Bleeding: Moderate N/A N/A Hemostasis Achieved: Pressure N/A N/A Procedural Pain: 3 N/A N/A Post Procedural Pain: 3 N/A N/A Debridement Treatment Procedure was tolerated well N/A N/A Response: Post Debridement 2x3.5x0.9 N/A N/A Measurements L x W x D (cm) Post Debridement Volume: 4.948 N/A N/A (cm) Periwound Skin Texture: No Abnormalities Noted N/A N/A Periwound Skin Moisture: No Abnormalities Noted N/A  N/A Periwound Skin Color: Erythema: Yes N/A N/A Erythema Location: Circumferential N/A N/A Temperature: Hot N/A N/A Tenderness on Palpation: Yes N/A N/A Wound Preparation: Ulcer Cleansing: N/A N/A Rinsed/Irrigated with Saline Topical Anesthetic Applied: Other: lidocaine 4% Procedures Performed: Debridement N/A N/A Treatment Notes Electronic Signature(s) Signed: 03/30/2018 5:40:08 PM By: Baltazar Najjar MD Entered By: Baltazar Najjar on 03/30/2018 10:43:59 Debra [name] ([id number]) -------------------------------------------------------------------------------- Pain Assessment Details Patient Name: Debra [name]. Date of  Service: 03/30/2018 9:45 AM Medical Record Number: [id number] Patient Account Number: [id number] Date of Birth/Sex: [birth year] (57 y.o. Female) Treating RN: Phillis Haggis Primary Care Prithvi Kooi: Baruch Gouty Other Clinician: Referring Vitor Overbaugh: Merrily Brittle Treating Tylisha Danis/Extender: Altamese [place] in Treatment: 0 Active Problems Location of Pain Severity and Description of Pain Patient Has Paino Yes Site Locations Pain Location: Pain in Ulcers Rate the pain. Current Pain Level: 3 Character of Pain Describe the Pain: Aching Pain Management and Medication Current Pain Management: Notes Topical or injectable lidocaine is offered to patient for acute pain when surgical debridement is performed. If needed, Patient is instructed to use over the counter pain medication for the following 24-48 hours after debridement. Wound care MDs do not prescribed pain medications. Patient has chronic pain or uncontrolled pain. Patient has been instructed to make an appointment with their Primary Care Physician for pain management. Electronic Signature(s) Signed: 04/01/2018 4:29:16 PM By: Alejandro Mulling Entered By: Alejandro Mulling on 03/30/2018 09:54:32 Debra [name] ([id number]) -------------------------------------------------------------------------------- Patient/Caregiver Education Details Patient Name: Debra [name] Date of Service: 03/30/2018 9:45 AM Medical Record Number: [id number] Patient Account Number: [id number] Date of Birth/Gender: 31-Jan-1961 (57 y.o. Female) Treating RN: Curtis Sites Primary Care Physician: Baruch Gouty Other Clinician: Referring Physician: Merrily Brittle Treating Physician/Extender: Altamese [place] in Treatment: 0 Education Assessment Education Provided To: Patient and Caregiver Education Topics Provided Wound/Skin Impairment: Handouts: Other: wound care as ordered Methods: Demonstration, Explain/Verbal Responses: State content  correctly Electronic Signature(s) Signed: 03/30/2018 5:00:43 PM By: Curtis Sites Entered By: Curtis Sites on 03/30/2018 12:52:43 Debra [name] ([id number]) -------------------------------------------------------------------------------- Wound Assessment Details Patient Name: Debra [name] Date of Service: 03/30/2018 9:45 AM Medical Record Number: [id number] Patient Account Number: [id number] Date of Birth/Sex: 08/22/1961 (57 y.o. Female) Treating RN: Ashok Cordia, Debi Primary Care Courtny Bennison: Baruch Gouty Other Clinician: Referring Milam Allbaugh: Merrily Brittle Treating Charlise Giovanetti/Extender: Maxwell Caul Weeks in Treatment: 0 Wound Status Wound Number: 1 Primary Etiology: Diabetic Wound/Ulcer of the Lower Extremity Wound Location: Left Groin Secondary Abscess Wounding Event: Gradually Appeared Etiology: Date Acquired: 03/23/2018 Wound Status: Open Weeks Of Treatment: 0 Comorbid History: Hypertension, Type II Diabetes, Clustered Wound: No Osteoarthritis Photos Photo Uploaded By: Renne Crigler on 03/30/2018 16:53:12 Wound Measurements Length: (cm) 2 Width: (cm) 3.7 Depth: (cm) 0.9 Area: (cm) 5.812 Volume: (cm) 5.231 % Reduction in Area: 0% % Reduction in Volume: 0% Epithelialization: None Tunneling: Yes Position (o'clock): 7 Maximum Distance: (cm) 3 Undermining: No Wound Description Classification: Grade 1 Wound Margin: Distinct, outline attached Exudate Amount: Large Exudate Type: Serosanguineous Exudate Color: red, brown Foul Odor After Cleansing: No Slough/Fibrino Yes Wound Bed Granulation Amount: Medium (34-66%) Exposed Structure Granulation Quality: Red Fascia Exposed: No Necrotic Amount: Medium (34-66%) Fat Layer (Subcutaneous Tissue) Exposed: No Necrotic Quality: Eschar, Adherent Slough Tendon Exposed: No Muscle Exposed: No Joint Exposed: No KERITH, SHERLEY A. ([id number]) Bone Exposed: No Periwound Skin Texture Texture Color No Abnormalities Noted:  No No Abnormalities Noted: No Erythema: Yes Moisture Erythema Location: Circumferential No Abnormalities Noted: No Temperature / Pain Temperature: Hot Tenderness  on Palpation: Yes Wound Preparation Ulcer Cleansing: Rinsed/Irrigated with Saline Topical Anesthetic Applied: Other: lidocaine 4%, Electronic Signature(s) Signed: 03/30/2018 12:27:24 PM By: Elliot Gurney, BSN, RN, CWS, Caresse RN, BSN Signed: 04/01/2018 4:29:16 PM By: Alejandro Mulling Entered By: Elliot Gurney BSN, RN, CWS, Darchelle on 03/30/2018 10:29:18 Debra [name] ([id number]) -------------------------------------------------------------------------------- Vitals Details Patient Name: Debra [name] Date of Service: 03/30/2018 9:45 AM Medical Record Number: [id number] Patient Account Number: [id number] Date of Birth/Sex: 06-18-1961 (57 y.o. Female) Treating RN: Ashok Cordia, Debi Primary Care Arali Somera: Baruch Gouty Other Clinician: Referring Cephus Tupy: Merrily Brittle Treating Zurisadai Helminiak/Extender: Altamese [place] in Treatment: 0 Vital Signs Time Taken: 09:54 Temperature (F): 98.2 Height (in): 65 Pulse (bpm): 70 Source: Stated Respiratory Rate (breaths/min): 18 Weight (lbs): 230 Blood Pressure (mmHg): 154/90 Source: Measured Reference Range: 80 - 120 mg / dl Body Mass Index (BMI): 38.3 Electronic Signature(s) Signed: 04/01/2018 4:29:16 PM By: Alejandro Mulling Entered By: Alejandro Mulling on 03/30/2018 09:55:00

## 2018-04-05 NOTE — Progress Notes (Signed)
JONEL, SICK ([id number]) Visit Report for 03/30/2018 Chief Complaint Document Details Patient Name: Debra [name], Debra [name]. Date of Service: 03/30/2018 9:45 AM Medical Record Number: [id number] Patient Account Number: [id number] Date of Birth/Sex: Jan 19, 1961 (57 y.o. Female) Treating RN: Huel Coventry Primary Care Provider: Baruch Gouty Other Clinician: Referring Provider: Merrily Brittle Treating Provider/Extender: Altamese [place] in Treatment: 0 Information Obtained from: Patient Chief Complaint 03/30/18; patient is here for review of an abscess site in the left lower abdominal quadrant Electronic Signature(s) Signed: 03/30/2018 5:40:08 PM By: Baltazar Najjar MD Entered By: Baltazar Najjar on 03/30/2018 10:46:04 Debra [name] ([id number]) -------------------------------------------------------------------------------- Debridement Details Patient Name: Debra [name]. Date of Service: 03/30/2018 9:45 AM Medical Record Number: [id number] Patient Account Number: [id number] Date of Birth/Sex: 1961/11/22 (57 y.o. Female) Treating RN: Huel Coventry Primary Care Provider: Baruch Gouty Other Clinician: Referring Provider: Merrily Brittle Treating Provider/Extender: Altamese [place] in Treatment: 0 Debridement Performed for Wound #1 Left Groin Assessment: Performed By: Physician Maxwell Caul, MD Debridement Type: Debridement Severity of Tissue Pre Fat layer exposed Debridement: Pre-procedure Verification/Time Yes - 10:35 Out Taken: Start Time: 10:35 Pain Control: Other : lidocaine 4% Total Area Debrided (L x W): 2 (cm) x 3.7 (cm) = 7.4 (cm) Tissue and other material Viable, Non-Viable, Slough, Subcutaneous, Skin: Dermis , Slough debrided: Level: Skin/Subcutaneous Tissue Debridement Description: Excisional Instrument: Curette, Scissors Specimen: Swab, Number of Specimens Taken: 1 Bleeding: Moderate Hemostasis Achieved: Pressure End Time: 10:35 Procedural Pain: 3 Post  Procedural Pain: 3 Response to Treatment: Procedure was tolerated well Post Debridement Measurements of Total Wound Length: (cm) 2 Width: (cm) 3.5 Depth: (cm) 0.9 Volume: (cm) 4.948 Character of Wound/Ulcer Post Debridement: Requires Further Debridement Severity of Tissue Post Debridement: Fat layer exposed Post Procedure Diagnosis Same as Pre-procedure Electronic Signature(s) Signed: 03/30/2018 12:27:24 PM By: Elliot Gurney, BSN, RN, CWS, Zniyah RN, BSN Signed: 03/30/2018 5:40:08 PM By: Baltazar Najjar MD Entered By: Baltazar Najjar on 03/30/2018 10:44:46 Debra [name] ([id number]) -------------------------------------------------------------------------------- HPI Details Patient Name: Debra [name]. Date of Service: 03/30/2018 9:45 AM Medical Record Number: [id number] Patient Account Number: [id number] Date of Birth/Sex: 08-Aug-1961 (57 y.o. 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 She tells me everything was fine up until a week ago. She noticed pain in her left groin area. By Saturday this it started to drain. She applied black salve to this area which she has done in the past and is helped heal problems in this area. This did not work. She was seen in the ER on 03/27/18 with an abscess in the left groin. Noted to have surrounding cellulitis and an indurated area. She underwent an IandD. She was started on patient clindamycin. Her white count was 11.9. Comprehensive metabolic panel normal lactic acid level normal at 1.2. They did not do a culture that I can see. She was seen yesterday at her primary physician's office she may have been put on cephalexin at that point. She is using iodoform  packing The patient is a type II diabetic with a history of PAD last hemoglobin A1c of 7.3. She has a history of parkinsonism, COPD, bipolar disorder hypertension. She is a continued 1 pack per day smoker She tells me that she does have a history of  recurrent abscesses in this area last about a year ago. Mostly she seems to care for these herself and they seem to go away. Electronic Signature(s) Signed: 03/30/2018 5:40:08 PM By: Baltazar Najjar MD Entered By: Baltazar Najjar on 03/30/2018 11:13:51 Debra [name] ([id number]) -------------------------------------------------------------------------------- Physical Exam Details Patient Name: Debra [name] Date of Service: 03/30/2018 9:45 AM Medical Record Number: [id number] Patient Account Number: [id number] Date of Birth/Sex: Mar 18, 1961 (57 y.o. Female) Treating RN: Huel Coventry Primary Care Provider: Baruch Gouty Other Clinician: Referring Provider: Merrily Brittle Treating Provider/Extender: Maxwell Caul [place] in Treatment: 0 Constitutional Patient is hypertensive.. Pulse regular and within target range for patient.[name] Respirations regular, non-labored and within target range.. Temperature is normal and within the target range for the patient.[name] appears in no distress. Eyes Conjunctivae clear. No discharge. Respiratory Respiratory effort is easy and symmetric bilaterally. Rate is normal at rest and on room air.. Bilateral breath sounds are clear and equal in all lobes with no wheezes, rales or rhonchi.. Cardiovascular Heart rhythm and rate regular, without murmur or gallop.. Gastrointestinal (GI) obese no masses no tenderness. In the right lower quadrant actually just above her symphysis pubis on the right there is lymphedema. In the middle of this a large punched out open area. This communicates inferiorly. Surface of it is not at all healthy. No liver or spleen enlargement or tenderness.. Genitourinary (GU) Bladder without fullness,  masses or tenderness.. Lymphatic none palpable in the inguinal area bilaterally. Integumentary (Hair, Skin) there is no primary skin issue. No rashes. Notes wound exam; the area in question is in the right lower abdominal quadrant actually just superior and to the left of her symphysis pubis. There is lymphedema with a pannus in this area. Wound bed itself had necrotic material over the surface. Underneath this at about 6 to 7:00 there was a firm area extending into the lymphedema. Predictably the 2 areas communicated. I cultured this area. Using a #5 curet necrotic surface debris from removed from the wound area. Electronic Signature(s) Signed: 03/30/2018 5:40:08 PM By: Baltazar Najjar MD Entered By: Baltazar Najjar on 03/30/2018 11:17:04 Debra [name] ([id number]) -------------------------------------------------------------------------------- Physician Orders Details Patient Name: Debra [name] Date of Service: 03/30/2018 9:45 AM Medical Record Number: [id number] Patient Account Number: [id number] Date of Birth/Sex: 07-08-1961 (57 y.o. Female) Treating RN: Huel Coventry Primary Care Provider: Baruch Gouty Other Clinician: Referring Provider: Merrily Brittle Treating Provider/Extender: Altamese [place] in Treatment: 0 Verbal / Phone Orders: No Diagnosis Coding Wound Cleansing Wound #1 Left Groin o Clean wound with Normal Saline. Anesthetic (add to Medication List) Wound #1 Left Groin o Topical Lidocaine 4% cream applied to wound bed prior to debridement (In Clinic Only). o Benzocaine Topical Anesthetic Spray applied to wound bed prior to debridement (In Clinic Only). Primary Wound Dressing Wound #1 Left Groin o Silver Alginate - pack lightly into tunnel at 7:00 Secondary Dressing Wound #1 Left Groin o Drawtex o Boardered Foam Dressing Dressing Change Frequency Wound #1 Left Groin o Change dressing every day. Follow-up Appointments Wound #1 Left Groin o  Return Appointment in 1 week. Additional Orders / Instructions Wound #1 Left Groin o Stop Smoking Medications-please add to medication list. Wound #1 Left Groin o P.O. Antibiotics - continue abx Laboratory o Bacteria identified in Wound by Culture (MICRO) - left groin oooo LOINC Code: 6462-6 oooo Convenience Name: Wound culture routine Debra [name], Debra [name] ([id number]) Electronic Signature(s) Signed: 03/30/2018 12:27:24 PM By: Elliot Gurney, BSN, RN, CWS, Sakira RN, BSN Signed: 03/30/2018 5:40:08 PM  By: Baltazar Najjar MD Entered By: Elliot Gurney, BSN, RN, CWS, Jahnay on 03/30/2018 10:37:54 Debra [name] ([id number]) -------------------------------------------------------------------------------- Problem List Details Patient Name: Debra [name] Date of Service: 03/30/2018 9:45 AM Medical Record Number: [id number] Patient Account Number: [id number] Date of Birth/Sex: 02/28/1961 (57 y.o. Female) Treating RN: Huel Coventry Primary Care Provider: Baruch Gouty Other Clinician: Referring Provider: Merrily Brittle Treating Provider/Extender: Altamese [place] in Treatment: 0 Active Problems ICD-10 Impacting Encounter Code Description Active Date Wound Healing Diagnosis L02.211 Cutaneous abscess of abdominal wall 03/30/2018 Yes T81.31XD Disruption of external operation (surgical) wound, not 03/30/2018 Yes elsewhere classified, subsequent encounter Inactive Problems Resolved Problems Electronic Signature(s) Signed: 03/30/2018 5:40:08 PM By: Baltazar Najjar MD Entered By: Baltazar Najjar on 03/30/2018 10:41:27 Debra [name] ([id number]) -------------------------------------------------------------------------------- Progress Note Details Patient Name: Debra [name] Date of Service: 03/30/2018 9:45 AM Medical Record Number: [id number] Patient Account Number: [id number] Date of Birth/Sex: [birth year] (57 y.o. 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 She tells me everything was fine up until a week ago. She noticed pain in her left groin area. By Saturday this it started to drain. She applied black salve to this area which she has done in the past and is helped heal problems in this area. This did not work. She was seen in the ER on 03/27/18 with an abscess in the left groin. Noted to have surrounding cellulitis and an indurated area. She underwent an IandD. She was started on patient clindamycin. Her white count was 11.9. Comprehensive metabolic panel normal lactic acid level normal at 1.2. They did not do a culture that I can see. She was seen yesterday at her primary physician's office she may have been put on cephalexin at that point. She is using iodoform packing The patient is a type II diabetic with a history of PAD last hemoglobin A1c of 7.3. She has a history of parkinsonism, COPD, bipolar disorder hypertension. She is a continued 1 pack per day smoker She tells me that she does have a history of recurrent abscesses in this area last about a year ago. Mostly she seems to care for these herself and they seem to go away. Wound History Patient presents with 1 open wound that has been present for approximately 03/23/18. Patient has been treating wound in the following manner: black salve then after ER wet to dry. Laboratory tests have not been performed in the last month. Patient reportedly has not tested positive for an antibiotic resistant organism. Patient reportedly has not tested positive for osteomyelitis. Patient History Information obtained from  Patient. Allergies Benadryl, canagliflozin, Depakote, Dilaudid, haloperidol, hydromorphone, Neosporin (neo-bac-polym), Singulair, Statins-Hmg- Coa Reductase Inhibitors, sitagliptin, sulfa, Victoza Family History Cancer - Maternal Grandparents, Diabetes - Father, Hypertension - Father,Paternal Grandparents, Seizures - Siblings, Stroke - Mother, Thyroid Problems - Mother,Child, No family history of Heart Disease, Hereditary Spherocytosis, Kidney Disease, Lung Disease, Tuberculosis. Social History Current some day smoker - .5 pack day, Marital Status - Married, Alcohol Use - Rarely, Drug Use - No History, Caffeine Use - Daily. Medical History Cardiovascular Patient has history of Hypertension Debra [name], Debra [name] ([id number]) Endocrine  Patient has history of Type II Diabetes Musculoskeletal Patient has history of Osteoarthritis Patient is treated with Insulin. Blood sugar is tested. Review of Systems (ROS) Constitutional Symptoms (General Health) Complains or has symptoms of Chills. Eyes Complains or has symptoms of Glasses / Contacts. Hematologic/Lymphatic The patient has no complaints or symptoms. Respiratory The patient has no complaints or symptoms. Cardiovascular hyperlipidemia Gastrointestinal The patient has no complaints or symptoms. Genitourinary The patient has no complaints or symptoms. Immunological The patient has no complaints or symptoms. Integumentary (Skin) Complains or has symptoms of Wounds. Neurologic myagia parkinson's Oncologic The patient has no complaints or symptoms. Psychiatric Complains or has symptoms of Anxiety, depression Objective Constitutional Patient is hypertensive.. Pulse regular and within target range for patient.[name] Respirations regular, non-labored and within target range.. Temperature is normal and within the target range for the patient.[name] appears in no distress. Vitals Time Taken: 9:54 AM, Height: 65 in, Source: Stated, Weight: 230 lbs,  Source: Measured, BMI: 38.3, Temperature: 98.2 F, Pulse: 70 bpm, Respiratory Rate: 18 breaths/min, Blood Pressure: 154/90 mmHg. Eyes Conjunctivae clear. No discharge. Respiratory Respiratory effort is easy and symmetric bilaterally. Rate is normal at rest and on room air.. Bilateral breath sounds are clear and equal in all lobes with no wheezes, rales or rhonchi.. Cardiovascular Debra [name], Debra [name] Kitchen ([id number]) Heart rhythm and rate regular, without murmur or gallop.. Gastrointestinal (GI) obese no masses no tenderness. In the right lower quadrant actually just above her symphysis pubis on the right there is lymphedema. In the middle of this a large punched out open area. This communicates inferiorly. Surface of it is not at all healthy. No liver or spleen enlargement or tenderness.. Genitourinary (GU) Bladder without fullness, masses or tenderness.. Lymphatic none palpable in the inguinal area bilaterally. General Notes: wound exam; the area in question is in the right lower abdominal quadrant actually just superior and to the left of her symphysis pubis. There is lymphedema with a pannus in this area. Wound bed itself had necrotic material over the surface. Underneath this at about 6 to 7:00 there was a firm area extending into the lymphedema. Predictably the 2 areas communicated. I cultured this area. Using a #5 curet necrotic surface debris from removed from the wound area. Integumentary (Hair, Skin) there is no primary skin issue. No rashes. Wound #1 status is Open. Original cause of wound was Gradually Appeared. The wound is located on the Left Groin. The wound measures 2cm length x 3.7cm width x 0.9cm depth; 5.812cm^2 area and 5.231cm^3 volume. There is no undermining noted, however, there is tunneling at 7:00 with a maximum distance of 3cm. There is a large amount of serosanguineous drainage noted. The wound margin is distinct with the outline attached to the wound base. There is medium  (34-66%) red granulation within the wound bed. There is a medium (34-66%) amount of necrotic tissue within the wound bed including Eschar and Adherent Slough. The periwound skin appearance exhibited: Erythema. The surrounding wound skin color is noted with erythema which is circumferential. Periwound temperature was noted as Hot. The periwound has tenderness on palpation. Assessment Active Problems ICD-10 L02.211 - Cutaneous abscess of abdominal wall T81.31XD - Disruption of external operation (surgical) wound, not elsewhere classified, subsequent encounter Procedures Wound #1 Pre-procedure diagnosis of Wound #1 is a Diabetic Wound/Ulcer of the Lower Extremity located on the Left Groin .Severity of Tissue Pre Debridement is: Fat layer exposed. There was a Excisional Skin/Subcutaneous Tissue Debridement with a total area of 7.4 sq cm performed by  Maxwell Caul, MD. With the following instrument(s): Curette, and Scissors. to remove Viable and Non-Viable tissue/material Material removed includes Subcutaneous Tissue, and Slough, Skin: Dermis, and Slough after achieving pain control using Other (lidocaine 4%). 1 specimen was taken by a Swab and sent to the lab per facility protocol. A time out was conducted at 10:35, prior to the start of the procedure. A Moderate amount of bleeding was controlled with Pressure. The procedure was tolerated well with a pain level of 3 throughout and a pain level of 3 following the procedure. Post Debridement Measurements: 2cm length x 3.5cm width x 0.9cm depth; 4.948cm^3 volume. Debra [name] ([id number]) Character of Wound/Ulcer Post Debridement requires further debridement. Severity of Tissue Post Debridement is: Fat layer exposed. Post procedure Diagnosis Wound #1: Same as Pre-Procedure Plan Wound Cleansing: Wound #1 Left Groin: Clean wound with Normal Saline. Anesthetic (add to Medication List): Wound #1 Left Groin: Topical Lidocaine 4% cream applied to  wound bed prior to debridement (In Clinic Only). Benzocaine Topical Anesthetic Spray applied to wound bed prior to debridement (In Clinic Only). Primary Wound Dressing: Wound #1 Left Groin: Silver Alginate - pack lightly into tunnel at 7:00 Secondary Dressing: Wound #1 Left Groin: Drawtex Boardered Foam Dressing Dressing Change Frequency: Wound #1 Left Groin: Change dressing every day. Follow-up Appointments: Wound #1 Left Groin: Return Appointment in 1 week. Additional Orders / Instructions: Wound #1 Left Groin: Stop Smoking Medications-please add to medication list.: Wound #1 Left Groin: P.O. Antibiotics - continue abx Laboratory ordered were: Wound culture routine - left groin #1 skin abscess in the setting of lymphedema in the abdominal pannus. This is already had an IandD and she is already on clindamycin. #2 there is a communicating area from roughly 4 to 7:00. I was able to identify this. #3 I done a culture but have not altered the clindamycin she is already on #4 silver alginate packing/drawtex/border foam #5 patient is cautioned to avoid getting this on her hands when she is doing the dressing. They have plastic gloves. Electronic Signature(s) Signed: 03/30/2018 5:40:08 PM By: Baltazar Najjar MD Debra [name] ([id number]) Entered By: Baltazar Najjar on 03/30/2018 11:19:30 Debra [name] ([id number]) -------------------------------------------------------------------------------- ROS/PFSH Details Patient Name: Debra [name] Date of Service: 03/30/2018 9:45 AM Medical Record Number: [id number] Patient Account Number: [id number] Date of Birth/Sex: July 21, 1961 (57 y.o. Female) Treating RN: Ashok Cordia, Debi Primary Care Provider: Baruch Gouty Other Clinician: Referring Provider: Merrily Brittle Treating Provider/Extender: Altamese [place] in Treatment: 0 Information Obtained From Patient Wound History Do you currently have one or more open woundso Yes How many open  wounds do you currently haveo 1 Approximately how long have you had your woundso 03/23/18 How have you been treating your wound(s) until nowo black salve then after ER wet to dry Has your wound(s) ever healed and then re-openedo No Have you had any lab work done in the past montho No Have you tested positive for an antibiotic resistant organism (MRSA, VRE)o No Have you tested positive for osteomyelitis (bone infection)o No Constitutional Symptoms (General Health) Complaints and Symptoms: Positive for: Chills Eyes Complaints and Symptoms: Positive for: Glasses / Contacts Integumentary (Skin) Complaints and Symptoms: Positive for: Wounds Psychiatric Complaints and Symptoms: Positive for: Anxiety Review of System Notes: depression Hematologic/Lymphatic Complaints and Symptoms: No Complaints or Symptoms Respiratory Complaints and Symptoms: No Complaints or Symptoms Cardiovascular Complaints and Symptoms: Review of System Notes: hyperlipidemia Debra [name], Debra [name] ([id number]) Medical History: Positive for: Hypertension Gastrointestinal Complaints and Symptoms: No  Complaints or Symptoms Endocrine Medical History: Positive for: Type II Diabetes Time with diabetes: 10 yrs Treated with: Insulin Blood sugar tested every day: Yes Tested : 4x day Genitourinary Complaints and Symptoms: No Complaints or Symptoms Immunological Complaints and Symptoms: No Complaints or Symptoms Musculoskeletal Medical History: Positive for: Osteoarthritis Neurologic Complaints and Symptoms: Review of System Notes: myagia parkinson's Oncologic Complaints and Symptoms: No Complaints or Symptoms Immunizations Pneumococcal Vaccine: Received Pneumococcal Vaccination: Yes Implantable Devices Family and Social History Cancer: Yes - Maternal Grandparents; Diabetes: Yes - Father; Heart Disease: No; Hereditary Spherocytosis: No; Hypertension: Yes - Father,Paternal Grandparents; Kidney Disease: No;  Lung Disease: No; Seizures: Yes - Siblings; Stroke: Yes - Mother; Thyroid Problems: Yes - Mother,Child; Tuberculosis: No; Current some day smoker - .5 pack day; Marital Status - Married; Alcohol Use: Rarely; Drug Use: No History; Caffeine Use: Daily; Financial Concerns: No; Food, Clothing or Shelter Needs: No; Support System Lacking: No; Transportation Concerns: No; Advanced Directives: No; Patient does not want information on Advanced Directives; Do not resuscitate: No; Living Will: No; Medical Power of Attorney: No Debra [name], Debra [name] ([id number]) Electronic Signature(s) Signed: 03/30/2018 5:40:08 PM By: Baltazar Najjar MD Signed: 04/01/2018 4:29:16 PM By: Alejandro Mulling Entered By: Alejandro Mulling on 03/30/2018 10:06:59 Debra [name] ([id number]) -------------------------------------------------------------------------------- SuperBill Details Patient Name: Debra [name] Date of Service: 03/30/2018 Medical Record Number: [id number] Patient Account Number: [id number] Date of Birth/Sex: 12-31-1960 (57 y.o. Female) Treating RN: Huel Coventry Primary Care Provider: Baruch Gouty Other Clinician: Referring Provider: Merrily Brittle Treating Provider/Extender: Maxwell Caul [place] in Treatment: 0 Diagnosis Coding ICD-10 Codes Code Description L02.211 Cutaneous abscess of abdominal wall T81.31XD Disruption of external operation (surgical) wound, not elsewhere classified, subsequent encounter Facility Procedures CPT4: Description Modifier Quantity Code [id number] 99213 - WOUND CARE VISIT-LEV 3 EST PT 1 CPT4: 96295284 11042 - DEB SUBQ TISSUE 20 SQ CM/< 1 ICD-10 Diagnosis Description L02.211 Cutaneous abscess of abdominal wall T81.31XD Disruption of external operation (surgical) wound, not elsewhere classified, subsequent encounter Physician Procedures CPT4: Description Modifier Quantity Code [id number] WC PHYS LEVEL 3 o NEW PT 25 1 ICD-10 Diagnosis Description L02.211 Cutaneous abscess of abdominal wall  T81.31XD Disruption of external operation (surgical) wound, not elsewhere classified, subsequent  encounter CPT4: 0272536 11042 - WC PHYS SUBQ TISS 20 SQ CM 1 ICD-10 Diagnosis Description L02.211 Cutaneous abscess of abdominal wall T81.31XD Disruption of external operation (surgical) wound, not elsewhere classified, subsequent encounter Electronic Signature(s) Signed: 03/30/2018 5:40:08 PM By: Baltazar Najjar MD Entered By: Baltazar Najjar on 03/30/2018 11:20:10

## 2018-04-05 NOTE — Progress Notes (Signed)
KIMORA, STANKOVIC ([id number]) Visit Report for 03/30/2018 Abuse/Suicide Risk Screen Details Patient Name: Debra [name], Debra [name]. Date of Service: 03/30/2018 9:45 AM Medical Record Number: [id number] Patient Account Number: [id number] Date of Birth/Sex: Mar 28, 1961 (57 y.o. Female) Treating RN: Phillis Haggis Primary Care Yedidya Duddy: Baruch Gouty Other Clinician: Referring Icker Swigert: Merrily Brittle Treating Harlyn Rathmann/Extender: Altamese [place] in Treatment: 0 Abuse/Suicide Risk Screen Items Answer ABUSE/SUICIDE RISK SCREEN: Has anyone close to you tried to hurt or harm you recentlyo No Do you feel uncomfortable with anyone in your familyo No Has anyone forced you do things that you didnot want to doo No Do you have any thoughts of harming yourselfo No Patient displays signs or symptoms of abuse and/or neglect. No Electronic Signature(s) Signed: 04/01/2018 4:29:16 PM By: Alejandro Mulling Entered By: Alejandro Mulling on 03/30/2018 10:07:05 [name] ([id number]) -------------------------------------------------------------------------------- Activities of Daily Living Details Patient Name: [name] Date of Service: 03/30/2018 9:45 AM Medical Record Number: [id number] Patient Account Number: [id number] Date of Birth/Sex: 11/20/1961 (57 y.o. Female) Treating RN: Phillis Haggis Primary Care Vickie Ponds: Baruch Gouty Other Clinician: Referring Declan Mier: Merrily Brittle Treating Jullian Previti/Extender: Altamese [place] in Treatment: 0 Activities of Daily Living Items Answer Activities of Daily Living (Please select one for each item) Drive Automobile Not Able Take Medications Completely Able Use Telephone Completely Able Care for Appearance Completely Able Use Toilet Completely Able Bath / Shower Completely Able Dress Self Completely Able Feed Self Completely Able Walk Completely Able Get In / Out Bed Completely Able Housework Completely Able Prepare Meals Completely Able Handle  Money Completely Able Shop for Self Completely Able Electronic Signature(s) Signed: 04/01/2018 4:29:16 PM By: Alejandro Mulling Entered By: Alejandro Mulling on 03/30/2018 10:07:34 [name] ([id number]) -------------------------------------------------------------------------------- Education Assessment Details Patient Name: [name] Date of Service: 03/30/2018 9:45 AM Medical Record Number: [id number] Patient Account Number: [id number] Date of Birth/Sex: [birth year] (57 y.o. Female) Treating RN: Phillis Haggis Primary Care Hava Massingale: Baruch Gouty Other Clinician: Referring Ghina Bittinger: Merrily Brittle Treating Gevon Markus/Extender: Altamese [place] in Treatment: 0 Primary Learner Assessed: Patient Learning Preferences/Education Level/Primary Language Learning Preference: Explanation, Printed Material Highest Education Level: College or Above Preferred Language: English Cognitive Barrier Assessment/Beliefs Language Barrier: No Translator Needed: No Memory Deficit: No Emotional Barrier: No Cultural/Religious Beliefs Affecting Medical Care: No Physical Barrier Assessment Impaired Vision: Yes Glasses Impaired Hearing: No Decreased Hand dexterity: No Knowledge/Comprehension Assessment Knowledge Level: Medium Comprehension Level: Medium Ability to understand written Medium instructions: Ability to understand verbal Medium instructions: Motivation Assessment Anxiety Level: Calm Cooperation: Cooperative Education Importance: Acknowledges Need Interest in Health Problems: Asks Questions Perception: Coherent Willingness to Engage in Self- Medium Management Activities: Readiness to Engage in Self- Medium Management Activities: Electronic Signature(s) Signed: 04/01/2018 4:29:16 PM By: Alejandro Mulling Entered By: Alejandro Mulling on 03/30/2018 10:07:55 [name] ([id number]) -------------------------------------------------------------------------------- Fall Risk  Assessment Details Patient Name: [name] Date of Service: 03/30/2018 9:45 AM Medical Record Number: [id number] Patient Account Number: [id number] Date of Birth/Sex: 1961-10-24 (57 y.o. Female) Treating RN: Ashok Cordia, Debi Primary Care Zalea Pete: Baruch Gouty Other Clinician: Referring Krew Hortman: Merrily Brittle Treating Nickia Boesen/Extender: Altamese [place] in Treatment: 0 Fall Risk Assessment Items Have you had 2 or more falls in the last 12 monthso 0 No Have you had any fall that resulted in injury in the last 12 monthso 0 No FALL RISK ASSESSMENT: History of falling - immediate or within 3 months 0 No Secondary diagnosis 15 Yes Ambulatory aid None/bed rest/wheelchair/nurse 0 No Crutches/cane/walker 0  No Furniture 0 No IV Access/Saline Lock 0 No Gait/Training Normal/bed rest/immobile 0 No Weak 0 No Impaired 0 No Mental Status Oriented to own ability 0 Yes Electronic Signature(s) Signed: 04/01/2018 4:29:16 PM By: Alejandro Mulling Entered By: Alejandro Mulling on 03/30/2018 10:08:17 [name] ([id number]) -------------------------------------------------------------------------------- Foot Assessment Details Patient Name: [name]. Date of Service: 03/30/2018 9:45 AM Medical Record Number: [id number] Patient Account Number: [id number] Date of Birth/Sex: 1961/06/07 (57 y.o. Female) Treating RN: Phillis Haggis Primary Care Hawley Michel: Baruch Gouty Other Clinician: Referring Ailish Prospero: Merrily Brittle Treating Karmyn Lowman/Extender: Altamese [place] in Treatment: 0 Foot Assessment Items Site Locations + = Sensation present, - = Sensation absent, C = Callus, U = Ulcer R = Redness, W = Warmth, M = Maceration, PU = Pre-ulcerative lesion F = Fissure, S = Swelling, D = Dryness Assessment Right: Left: Other Deformity: No No Prior Foot Ulcer: No No Prior Amputation: No No Charcot Joint: No No Ambulatory Status: Gait: Electronic Signature(s) Signed: 04/01/2018  4:29:16 PM By: Alejandro Mulling Entered By: Alejandro Mulling on 03/30/2018 10:08:35 [name] ([id number]) -------------------------------------------------------------------------------- Nutrition Risk Assessment Details Patient Name: [name] Date of Service: 03/30/2018 9:45 AM Medical Record Number: [id number] Patient Account Number: [id number] Date of Birth/Sex: [birth year] (57 y.o. Female) Treating RN: Ashok Cordia, Debi Primary Care Brittony Billick: Baruch Gouty Other Clinician: Referring Gearald Stonebraker: Merrily Brittle Treating Roshanda Balazs/Extender: Maxwell Caul Weeks in Treatment: 0 Height (in): 65 Weight (lbs): 230 Body Mass Index (BMI): 38.3 Nutrition Risk Assessment Items NUTRITION RISK SCREEN: I have an illness or condition that made me change the kind and/or amount of 2 Yes food I eat I eat fewer than two meals per day 3 Yes I eat few fruits and vegetables, or milk products 0 No I have three or more drinks of beer, liquor or wine almost every day 0 No I have tooth or mouth problems that make it hard for me to eat 0 No I don't always have enough money to buy the food I need 0 No I eat alone most of the time 0 No I take three or more different prescribed or over-the-counter drugs a day 1 Yes Without wanting to, I have lost or gained 10 pounds in the last six months 0 No I am not always physically able to shop, cook and/or feed myself 0 No Nutrition Protocols Good Risk Protocol Moderate Risk Protocol Electronic Signature(s) Signed: 04/01/2018 4:29:16 PM By: Alejandro Mulling Entered By: Alejandro Mulling on 03/30/2018 10:08:31

## 2018-04-06 ENCOUNTER — Encounter: Payer: Managed Care, Other (non HMO) | Attending: Internal Medicine | Admitting: Internal Medicine

## 2018-04-06 DIAGNOSIS — E1151 Type 2 diabetes mellitus with diabetic peripheral angiopathy without gangrene: Secondary | ICD-10-CM | POA: Insufficient documentation

## 2018-04-06 DIAGNOSIS — F17210 Nicotine dependence, cigarettes, uncomplicated: Secondary | ICD-10-CM | POA: Diagnosis not present

## 2018-04-06 DIAGNOSIS — T8131XA Disruption of external operation (surgical) wound, not elsewhere classified, initial encounter: Secondary | ICD-10-CM | POA: Diagnosis not present

## 2018-04-06 DIAGNOSIS — J449 Chronic obstructive pulmonary disease, unspecified: Secondary | ICD-10-CM | POA: Diagnosis not present

## 2018-04-06 DIAGNOSIS — Y838 Other surgical procedures as the cause of abnormal reaction of the patient, or of later complication, without mention of misadventure at the time of the procedure: Secondary | ICD-10-CM | POA: Insufficient documentation

## 2018-04-06 DIAGNOSIS — I10 Essential (primary) hypertension: Secondary | ICD-10-CM | POA: Diagnosis not present

## 2018-04-06 DIAGNOSIS — G20 Parkinson's disease: Secondary | ICD-10-CM | POA: Insufficient documentation

## 2018-04-08 ENCOUNTER — Ambulatory Visit (INDEPENDENT_AMBULATORY_CARE_PROVIDER_SITE_OTHER): Payer: Managed Care, Other (non HMO) | Admitting: Vascular Surgery

## 2018-04-09 NOTE — Progress Notes (Signed)
PRISTINE, GLADHILL ([id number]) Visit Report for 04/06/2018 Arrival Information Details Patient Name: Debra [name], Debra [name]. Date of Service: 04/06/2018 10:30 AM Medical Record Number: [id number] Patient Account Number: [id number] Date of Birth/Sex: 11-13-1961 (57 y.o. F) Treating RN: Phillis Haggis Primary Care Royelle Hinchman: Baruch Gouty Other Clinician: Referring Shloimy Michalski: Baruch Gouty Treating Dondre Catalfamo/Extender: Altamese [place] in Treatment: 1 Visit Information History Since Last Visit All ordered tests and consults were completed: No Patient Arrived: Ambulatory Added or deleted any medications: No Arrival Time: 11:03 Any new allergies or adverse reactions: No Accompanied By: friend Had a fall or experienced change in No Transfer Assistance: None activities of daily living that may affect Patient Identification Verified: Yes risk of falls: Secondary Verification Process Completed: Yes Signs or symptoms of abuse/neglect since last visito No Patient Requires Transmission-Based No Hospitalized since last visit: No Precautions: Implantable device outside of the clinic excluding No Patient Has Alerts: Yes cellular tissue based products placed in the center Patient Alerts: DM II since last visit: Has Dressing in Place as Prescribed: Yes Pain Present Now: Yes Electronic Signature(s) Signed: 04/06/2018 4:27:21 PM By: Alejandro Mulling Entered By: Alejandro Mulling on 04/06/2018 11:04:18 Debra [name] ([id number]) -------------------------------------------------------------------------------- Clinic Level of Care Assessment Details Patient Name: Debra [name] Date of Service: 04/06/2018 10:30 AM Medical Record Number: [id number] Patient Account Number: [id number] Date of Birth/Sex: [birth year] (57 y.o. F) Treating RN: Curtis Sites Primary Care Monna Crean: Baruch Gouty Other Clinician: Referring Bracha Frankowski: Baruch Gouty Treating Rande Roylance/Extender: Altamese  in Treatment: 1 Clinic  Level of Care Assessment Items TOOL 4 Quantity Score  - Use when only an EandM is performed on FOLLOW-UP visit 0 ASSESSMENTS - Nursing Assessment / Reassessment X - Reassessment of Co-morbidities (includes updates in patient status) 1 10 X- 1 5 Reassessment of Adherence to Treatment Plan ASSESSMENTS - Wound and Skin Assessment / Reassessment X - Simple Wound Assessment / Reassessment - one wound 1 5  - 0 Complex Wound Assessment / Reassessment - multiple wounds  - 0 Dermatologic / Skin Assessment (not related to wound area) ASSESSMENTS - Focused Assessment  - Circumferential Edema Measurements - multi extremities 0  - 0 Nutritional Assessment / Counseling / Intervention  - 0 Lower Extremity Assessment (monofilament, tuning fork, pulses)  - 0 Peripheral Arterial Disease Assessment (using hand held doppler) ASSESSMENTS - Ostomy and/or Continence Assessment and Care  - Incontinence Assessment and Management 0  - 0 Ostomy Care Assessment and Management (repouching, etc.) PROCESS - Coordination of Care X - Simple Patient / Family Education for ongoing care 1 15  - 0 Complex (extensive) Patient / Family Education for ongoing care  - 0 Staff obtains Chiropractor, Records, Test Results / Process Orders  - 0 Staff telephones HHA, Nursing Homes / Clarify orders / etc  - 0 Routine Transfer to another Facility (non-emergent condition)  - 0 Routine Hospital Admission (non-emergent condition)  - 0 New Admissions / Manufacturing engineer / Ordering NPWT, Apligraf, etc.  - 0 Emergency Hospital Admission (emergent condition) X- 1 10 Simple Discharge Coordination CHRISTEEN, LAI. ([id number])  - 0 Complex (extensive) Discharge Coordination PROCESS - Special Needs  - Pediatric / Minor Patient Management 0  - 0 Isolation Patient Management  - 0 Hearing / Language / Visual special needs  - 0 Assessment of Community assistance (transportation, D/C  planning, etc.)  - 0 Additional assistance / Altered mentation  - 0 Support Surface(s) Assessment (bed, cushion, seat, etc.) INTERVENTIONS - Wound Cleansing / Measurement X - Simple  Wound Cleansing - one wound 1 5  - 0 Complex Wound Cleansing - multiple wounds X- 1 5 Wound Imaging (photographs - any number of wounds)  - 0 Wound Tracing (instead of photographs) X- 1 5 Simple Wound Measurement - one wound  - 0 Complex Wound Measurement - multiple wounds INTERVENTIONS - Wound Dressings X - Small Wound Dressing one or multiple wounds 1 10  - 0 Medium Wound Dressing one or multiple wounds  - 0 Large Wound Dressing one or multiple wounds  - 0 Application of Medications - topical  - 0 Application of Medications - injection INTERVENTIONS - Miscellaneous  - External ear exam 0  - 0 Specimen Collection (cultures, biopsies, blood, body fluids, etc.)  - 0 Specimen(s) / Culture(s) sent or taken to Lab for analysis  - 0 Patient Transfer (multiple staff / Nurse, adult / Similar devices)  - 0 Simple Staple / Suture removal (25 or less)  - 0 Complex Staple / Suture removal (26 or more)  - 0 Hypo / Hyperglycemic Management (close monitor of Blood Glucose)  - 0 Ankle / Brachial Index (ABI) - do not check if billed separately X- 1 5 Vital Signs Stankus, Jenesys A. ([id number]) Has the patient been seen at the hospital within the last three years: Yes Total Score: 75 Level Of Care: New/Established - Level 2 Electronic Signature(s) Signed: 04/06/2018 4:48:19 PM By: Curtis Sites Entered By: Curtis Sites on 04/06/2018 11:51:06 Debra [name] ([id number]) -------------------------------------------------------------------------------- Encounter Discharge Information Details Patient Name: Debra [name]. Date of Service: 04/06/2018 10:30 AM Medical Record Number: [id number] Patient Account Number: [id number] Date of Birth/Sex: 04-Jun-1961 (57 y.o. F) Treating RN:  Curtis Sites Primary Care Pascal Stiggers: Baruch Gouty Other Clinician: Referring Rodney Wigger: Baruch Gouty Treating Tashena Ibach/Extender: Altamese [place] in Treatment: 1 Encounter Discharge Information Items Discharge Pain Level: 0 Discharge Condition: Stable Ambulatory Status: Ambulatory Discharge Destination: Home Transportation: Private Auto Accompanied By: friend Schedule Follow-up Appointment: Yes Medication Reconciliation completed and No provided to Patient/Care Zakk Borgen: Provided on Clinical Summary of Care: 04/06/2018 Form Type Recipient Paper Patient KK Electronic Signature(s) Signed: 04/06/2018 12:52:59 PM By: Curtis Sites Entered By: Curtis Sites on 04/06/2018 12:52:58 Debra [name] ([id number]) -------------------------------------------------------------------------------- Lower Extremity Assessment Details Patient Name: Debra [name] Date of Service: 04/06/2018 10:30 AM Medical Record Number: [id number] Patient Account Number: [id number] Date of Birth/Sex: Jan 19, 1961 (57 y.o. F) Treating RN: Phillis Haggis Primary Care Jemma Rasp: Baruch Gouty Other Clinician: Referring Joesph Marcy: Baruch Gouty Treating Jazzmyn Filion/Extender: Maxwell Caul Weeks in Treatment: 1 Electronic Signature(s) Signed: 04/06/2018 4:27:21 PM By: Alejandro Mulling Entered By: Alejandro Mulling on 04/06/2018 11:12:10 Debra [name] ([id number]) -------------------------------------------------------------------------------- Multi Wound Chart Details Patient Name: Debra [name] Date of Service: 04/06/2018 10:30 AM Medical Record Number: [id number] Patient Account Number: [id number] Date of Birth/Sex: 24-Oct-1961 (57 y.o. F) Treating RN: Curtis Sites Primary Care Hien Perreira: Baruch Gouty Other Clinician: Referring Valery Chance: Baruch Gouty Treating Kevork Joyce/Extender: Altamese [place] in Treatment: 1 Vital Signs Height(in): 65 Pulse(bpm): 68 Weight(lbs): 230 Blood Pressure(mmHg):  137/76 Body Mass Index(BMI): 38 Temperature(F): 98.3 Respiratory Rate 18 (breaths/min): Photos: [1:No Photos] [N/A:N/A] Wound Location: [1:Left Groin] [N/A:N/A] Wounding Event: [1:Gradually Appeared] [N/A:N/A] Primary Etiology: [1:Abscess] [N/A:N/A] Comorbid History: [1:Hypertension, Type II Diabetes, Osteoarthritis] [N/A:N/A] Date Acquired: [1:03/23/2018] [N/A:N/A] Weeks of Treatment: [1:1] [N/A:N/A] Wound Status: [1:Open] [N/A:N/A] Measurements L x W x D [1:1.6x3.6x0.5] [N/A:N/A] (cm) Area (cm) : [1:4.524] [N/A:N/A] Volume (cm) : [1:2.262] [N/A:N/A] % Reduction in Area: [1:22.20%] [N/A:N/A] % Reduction in Volume: [1:56.80%] [N/A:N/A] Position  1 (o'clock): [1:7] Maximum Distance 1 (cm): [1:1.8] Tunneling: [1:Yes] [N/A:N/A] Classification: [1:Full Thickness Without Exposed Support Structures] [N/A:N/A] Exudate Amount: [1:Large] [N/A:N/A] Exudate Type: [1:Serosanguineous] [N/A:N/A] Exudate Color: [1:red, brown] [N/A:N/A] Wound Margin: [1:Distinct, outline attached] [N/A:N/A] Granulation Amount: [1:Medium (34-66%)] [N/A:N/A] Granulation Quality: [1:Red] [N/A:N/A] Necrotic Amount: [1:Medium (34-66%)] [N/A:N/A] Necrotic Tissue: [1:Eschar, Adherent Slough] [N/A:N/A] Exposed Structures: [1:Fascia: No Fat Layer (Subcutaneous Tissue) Exposed: No Tendon: No Muscle: No Joint: No Bone: No] [N/A:N/A] Epithelialization: [1:None] [N/A:N/A] Periwound Skin Texture: [1:No Abnormalities Noted] [N/A:N/A] Periwound Skin Moisture: No Abnormalities Noted N/A N/A Periwound Skin Color: Erythema: Yes N/A N/A Erythema Location: Circumferential N/A N/A Temperature: Hot N/A N/A Tenderness on Palpation: Yes N/A N/A Wound Preparation: Ulcer Cleansing: N/A N/A Rinsed/Irrigated with Saline Topical Anesthetic Applied: Other: lidocaine 4% Treatment Notes Electronic Signature(s) Signed: 04/06/2018 4:28:35 PM By: Baltazar Najjar MD Entered By: Baltazar Najjar on 04/06/2018 12:15:21 Debra [name]  ([id number]) -------------------------------------------------------------------------------- Multi-Disciplinary Care Plan Details Patient Name: Debra [name]. Date of Service: 04/06/2018 10:30 AM Medical Record Number: [id number] Patient Account Number: [id number] Date of Birth/Sex: 27-Jun-1961 (57 y.o. F) Treating RN: Curtis Sites Primary Care Jaylan Hinojosa: Baruch Gouty Other Clinician: Referring Yittel Emrich: Baruch Gouty Treating Ethan Clayburn/Extender: Altamese [place] in Treatment: 1 Active Inactive ` Orientation to the Wound Care Program Nursing Diagnoses: Knowledge deficit related to the wound healing center program Goals: Patient/caregiver will verbalize understanding of the Wound Healing Center Program Date Initiated: 04/06/2018 Target Resolution Date: 07/16/2018 Goal Status: Active Interventions: Provide education on orientation to the wound center Notes: ` Wound/Skin Impairment Nursing Diagnoses: Impaired tissue integrity Goals: Ulcer/skin breakdown will heal within 14 weeks Date Initiated: 04/06/2018 Target Resolution Date: 07/16/2018 Goal Status: Active Interventions: Assess patient/caregiver ability to obtain necessary supplies Assess patient/caregiver ability to perform ulcer/skin care regimen upon admission and as needed Assess ulceration(s) every visit Notes: Electronic Signature(s) Signed: 04/06/2018 4:48:19 PM By: Curtis Sites Entered By: Curtis Sites on 04/06/2018 11:41:23 Debra [name] ([id number]) -------------------------------------------------------------------------------- Pain Assessment Details Patient Name: Debra [name]. Date of Service: 04/06/2018 10:30 AM Medical Record Number: [id number] Patient Account Number: [id number] Date of Birth/Sex: 1961-07-23 (57 y.o. F) Treating RN: Phillis Haggis Primary Care Song Garris: Baruch Gouty Other Clinician: Referring Adriane Gabbert: Baruch Gouty Treating Rebeka Kimble/Extender: Altamese [place] in Treatment:  1 Active Problems Location of Pain Severity and Description of Pain Patient Has Paino Yes Site Locations Pain Location: Generalized Pain Rate the pain. Current Pain Level: 1 Character of Pain Describe the Pain: Aching Pain Management and Medication Current Pain Management: Notes Topical or injectable lidocaine is offered to patient for acute pain when surgical debridement is performed. If needed, Patient is instructed to use over the counter pain medication for the following 24-48 hours after debridement. Wound care MDs do not prescribed pain medications. Patient has chronic pain or uncontrolled pain. Patient has been instructed to make an appointment with their Primary Care Physician for pain management. Electronic Signature(s) Signed: 04/06/2018 4:27:21 PM By: Alejandro Mulling Entered By: Alejandro Mulling on 04/06/2018 11:04:56 Debra [name] ([id number]) -------------------------------------------------------------------------------- Patient/Caregiver Education Details Patient Name: Debra [name] Date of Service: 04/06/2018 10:30 AM Medical Record Number: [id number] Patient Account Number: [id number] Date of Birth/Gender: August 25, 1961 (57 y.o. F) Treating RN: Curtis Sites Primary Care Physician: Baruch Gouty Other Clinician: Referring Physician: Baruch Gouty Treating Physician/Extender: Altamese [place] in Treatment: 1 Education Assessment Education Provided To: Patient Education Topics Provided Wound/Skin Impairment: Handouts: Other: wound care as ordered Methods: Demonstration, Explain/Verbal Responses: State content correctly Electronic Signature(s) Signed: 04/06/2018 4:48:19  PM By: Curtis Sites Entered By: Curtis Sites on 04/06/2018 12:54:07 Debra [name] ([id number]) -------------------------------------------------------------------------------- Wound Assessment Details Patient Name: Debra [name] Date of Service: 04/06/2018 10:30 AM Medical Record Number:  [id number] Patient Account Number: [id number] Date of Birth/Sex: 1961/11/04 (57 y.o. F) Treating RN: Phillis Haggis Primary Care Macai Sisneros: Baruch Gouty Other Clinician: Referring Tristy Udovich: Baruch Gouty Treating Chieko Neises/Extender: Altamese [place] in Treatment: 1 Wound Status Wound Number: 1 Primary Etiology: Abscess Wound Location: Left Groin Wound Status: Open Wounding Event: Gradually Appeared Comorbid Hypertension, Type II Diabetes, History: Osteoarthritis Date Acquired: 03/23/2018 Weeks Of Treatment: 1 Clustered Wound: No Photos Photo Uploaded By: Alejandro Mulling on 04/06/2018 16:20:09 Wound Measurements Length: (cm) 1.6 % Re Width: (cm) 3.6 % Re Depth: (cm) 0.5 Epit Area: (cm) 4.524 Tun Volume: (cm) 2.262 M duction in Area: 22.2% duction in Volume: 56.8% helialization: None neling: Yes Position (o'clock): 7 aximum Distance: (cm) 1.8 Undermining: No Wound Description Full Thickness Without Exposed Support Foul Classification: Structures Slou Wound Margin: Distinct, outline attached Exudate Large Amount: Exudate Type: Serosanguineous Exudate Color: red, brown Odor After Cleansing: No gh/Fibrino Yes Wound Bed Granulation Amount: Medium (34-66%) Exposed Structure Granulation Quality: Red Fascia Exposed: No Necrotic Amount: Medium (34-66%) Fat Layer (Subcutaneous Tissue) Exposed: No Necrotic Quality: Eschar, Adherent Slough Tendon Exposed: No Muscle Exposed: No Joint Exposed: No Bone Exposed: No Bau, Kura A. ([id number]) Periwound Skin Texture Texture Color No Abnormalities Noted: No No Abnormalities Noted: No Erythema: Yes Moisture Erythema Location: Circumferential No Abnormalities Noted: No Temperature / Pain Temperature: Hot Tenderness on Palpation: Yes Wound Preparation Ulcer Cleansing: Rinsed/Irrigated with Saline Topical Anesthetic Applied: Other: lidocaine 4%, Treatment Notes Wound #1 (Left Groin) 1. Cleansed with: Clean  wound with Normal Saline 2. Anesthetic Topical Lidocaine 4% cream to wound bed prior to debridement 4. Dressing Applied: Other dressing (specify in notes) 5. Secondary Dressing Applied Bordered Foam Dressing Notes silvercel Electronic Signature(s) Signed: 04/06/2018 4:27:21 PM By: Alejandro Mulling Entered By: Alejandro Mulling on 04/06/2018 11:11:01 Debra [name] ([id number]) -------------------------------------------------------------------------------- Vitals Details Patient Name: Debra [name] Date of Service: 04/06/2018 10:30 AM Medical Record Number: [id number] Patient Account Number: [id number] Date of Birth/Sex: Aug 18, 1961 (57 y.o. F) Treating RN: Phillis Haggis Primary Care Marqui Formby: Baruch Gouty Other Clinician: Referring Cristi Gwynn: Baruch Gouty Treating Haden Cavenaugh/Extender: Altamese [place] in Treatment: 1 Vital Signs Time Taken: 11:05 Temperature (F): 98.3 Height (in): 65 Pulse (bpm): 68 Weight (lbs): 230 Respiratory Rate (breaths/min): 18 Body Mass Index (BMI): 38.3 Blood Pressure (mmHg): 137/76 Reference Range: 80 - 120 mg / dl Electronic Signature(s) Signed: 04/06/2018 4:27:21 PM By: Alejandro Mulling Entered By: Alejandro Mulling on 04/06/2018 11:05:16

## 2018-04-09 NOTE — Progress Notes (Signed)
AVIN, UPPERMAN ([id number]) Visit Report for 04/06/2018 HPI Details Patient Name: Debra [name], Debra [name]. Date of Service: 04/06/2018 10:30 AM Medical Record Number: [id number] Patient Account Number: [id number] Date of Birth/Sex: 07/13/1961 (57 y.o. 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 She tells me everything was fine up until a week ago. She noticed pain in her left groin area. By Saturday this it started to drain. She applied black salve to this area which she has done in the past and is helped heal problems in this area. This did not work. She was seen in the ER on 03/27/18 with an abscess in the left groin. Noted to have surrounding cellulitis and an indurated area. She underwent an IandD. She was started on patient clindamycin. Her white count was 11.9. Comprehensive metabolic panel normal lactic acid level normal at 1.2. They did not do a culture that I can see. She was seen yesterday at her primary physician's office she may have been put on cephalexin at that point. She is using iodoform packing The patient is a type II diabetic with a history of PAD last hemoglobin A1c of 7.3. She has a history of parkinsonism, COPD, bipolar disorder hypertension. She is a continued 1 pack per day smoker She tells me that she does have a history of recurrent abscesses in this area last about a year ago. Mostly she seems to care for these herself and they seem to go away. 04/06/18; patient's surgical IandD on the lower left abdominal quadrant just above the symphysis pubis appears better. Following is better we've been using silver alginate. Culture I did last week did not grow MRSA rather Enterococcus faecalis.  I substituted Augmentin for the clindamycin however the patient didn't have enough money, however she will pick it up this morning Electronic Signature(s) Signed: 04/06/2018 4:28:35 PM By: Baltazar Najjar MD Entered By: Baltazar Najjar on 04/06/2018 12:16:39 Debra [name] ([id number]) -------------------------------------------------------------------------------- Physical Exam Details Patient Name: Debra [name] Date of Service: 04/06/2018 10:30 AM Medical Record Number: [id number] Patient Account Number: [id number] Date of Birth/Sex: [birth year] (57 y.o. F) Treating RN: Curtis Sites Primary Care Provider: Baruch Gouty Other Clinician: Referring Provider: Baruch Gouty Treating Provider/Extender: Altamese [place] in Treatment: 1 Constitutional Sitting or standing Blood Pressure is within target range for patient.. Pulse regular and within target range for patient.[name] Respirations regular, non-labored and within target range.. Temperature is normal and within the target range for the patient.[name] appears in no distress. Eyes Conjunctivae clear. No discharge. Respiratory Respiratory effort is easy and symmetric bilaterally. Rate is normal at rest and on room air.. Gastrointestinal (GI) obese, no masses. Psychiatric No evidence of depression, anxiety, or agitation. Calm, cooperative, and communicative. Appropriate interactions and affect.. Notes wound exam; urine questions in the right lower abdominal quadrant actually just superior to the left of her symphysis pubis. There is lymphedema with a pannus in this area. The wound bed looks healthier. Does not require debridement. She does have a Connell at 6 to 7:00 however this has less depth than last week. Electronic Signature(s) Signed: 04/06/2018 4:28:35 PM By: Baltazar Najjar MD Entered By: Baltazar Najjar on 04/06/2018 12:21:16 Debra [name]  ([id number]) -------------------------------------------------------------------------------- Physician Orders Details Patient Name: Debra [name] Date of Service:  04/06/2018 10:30 AM Medical Record Number: [id number] Patient Account Number: [id number] Date of Birth/Sex: [birth year] (57 y.o. F) Treating RN: Curtis Sites Primary Care Provider: Baruch Gouty Other Clinician: Referring Provider: Baruch Gouty Treating Provider/Extender: Altamese [place] in Treatment: 1 Verbal / Phone Orders: No Diagnosis Coding Wound Cleansing Wound #1 Left Groin o Clean wound with Normal Saline. Anesthetic (add to Medication List) Wound #1 Left Groin o Topical Lidocaine 4% cream applied to wound bed prior to debridement (In Clinic Only). o Benzocaine Topical Anesthetic Spray applied to wound bed prior to debridement (In Clinic Only). Primary Wound Dressing Wound #1 Left Groin o Silver Alginate - pack lightly into tunnel at 7:00 Secondary Dressing Wound #1 Left Groin o Boardered Foam Dressing o Drawtex Dressing Change Frequency Wound #1 Left Groin o Change dressing every day. Follow-up Appointments Wound #1 Left Groin o Return Appointment in 1 week. Additional Orders / Instructions Wound #1 Left Groin o Stop Smoking Medications-please add to medication list. Wound #1 Left Groin o P.O. Antibiotics - continue abx Electronic Signature(s) Signed: 04/06/2018 4:28:35 PM By: Baltazar Najjar MD Signed: 04/06/2018 4:48:19 PM By: Curtis Sites Entered By: Curtis Sites on 04/06/2018 11:43:34 Debra [name] ([id number] [name] ([id number]) -------------------------------------------------------------------------------- Problem List Details Patient Name: Debra [name] Date of Service: 04/06/2018 10:30 AM Medical Record Number: [id number] Patient Account Number: [id number] Date of Birth/Sex: 01/10/1961 (57 y.o. F) Treating RN: Curtis Sites Primary Care Provider: Baruch Gouty  Other Clinician: Referring Provider: Baruch Gouty Treating Provider/Extender: Altamese [place] in Treatment: 1 Active Problems ICD-10 Impacting Encounter Code Description Active Date Wound Healing Diagnosis L02.211 Cutaneous abscess of abdominal wall 03/30/2018 Yes T81.31XD Disruption of external operation (surgical) wound, not 03/30/2018 Yes elsewhere classified, subsequent encounter Inactive Problems Resolved Problems Electronic Signature(s) Signed: 04/06/2018 4:28:35 PM By: Baltazar Najjar MD Entered By: Baltazar Najjar on 04/06/2018 12:11:32 Debra [name] ([id number]) -------------------------------------------------------------------------------- Progress Note Details Patient Name: Debra [name] Date of Service: 04/06/2018 10:30 AM Medical Record Number: [id number] Patient Account Number: [id number] Date of Birth/Sex: 1961/10/13 (57 y.o. 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 She tells me everything was fine up until a week ago. She noticed pain in her left groin area. By Saturday this it started to drain. She applied black salve to this area which she has done in the past and is helped heal problems in this area. This did not work. She was seen in the ER on 03/27/18 with an abscess in the left groin. Noted to have surrounding cellulitis and an indurated area. She underwent an IandD. She was started on patient clindamycin. Her white count was 11.9. Comprehensive metabolic panel normal lactic acid level normal at 1.2. They did not do a culture that I can see. She was seen yesterday at her primary physician's office she may have been put on cephalexin at that point. She is using  iodoform packing The patient is a type II diabetic with a history of PAD last hemoglobin A1c of 7.3. She has a history of parkinsonism, COPD, bipolar disorder hypertension. She is a continued 1 pack per day smoker She tells me that she does have a history of recurrent abscesses in this area last about a year ago. Mostly she seems  to care for these herself and they seem to go away. 04/06/18; patient's surgical IandD on the lower left abdominal quadrant just above the symphysis pubis appears better. Following is better we've been using silver alginate. Culture I did last week did not grow MRSA rather Enterococcus faecalis. I substituted Augmentin for the clindamycin however the patient didn't have enough money, however she will pick it up this morning Objective Constitutional Sitting or standing Blood Pressure is within target range for patient.. Pulse regular and within target range for patient.[name] Respirations regular, non-labored and within target range.. Temperature is normal and within the target range for the patient.[name] appears in no distress. Vitals Time Taken: 11:05 AM, Height: 65 in, Weight: 230 lbs, BMI: 38.3, Temperature: 98.3 F, Pulse: 68 bpm, Respiratory Rate: 18 breaths/min, Blood Pressure: 137/76 mmHg. Eyes Conjunctivae clear. No discharge. Respiratory Respiratory effort is easy and symmetric bilaterally. Rate is normal at rest and on room air.. Gastrointestinal (GI) obese, no masses. Debra [name] ([id number]) Psychiatric No evidence of depression, anxiety, or agitation. Calm, cooperative, and communicative. Appropriate interactions and affect.. General Notes: wound exam; urine questions in the right lower abdominal quadrant actually just superior to the left of her symphysis pubis. There is lymphedema with a pannus in this area. The wound bed looks healthier. Does not require debridement. She does have a Connell at 6 to 7:00 however this has less depth than last  week. Integumentary (Hair, Skin) Wound #1 status is Open. Original cause of wound was Gradually Appeared. The wound is located on the Left Groin. The wound measures 1.6cm length x 3.6cm width x 0.5cm depth; 4.524cm^2 area and 2.262cm^3 volume. There is no undermining noted, however, there is tunneling at 7:00 with a maximum distance of 1.8cm. There is a large amount of serosanguineous drainage noted. The wound margin is distinct with the outline attached to the wound base. There is medium (34-66%) red granulation within the wound bed. There is a medium (34-66%) amount of necrotic tissue within the wound bed including Eschar and Adherent Slough. The periwound skin appearance exhibited: Erythema. The surrounding wound skin color is noted with erythema which is circumferential. Periwound temperature was noted as Hot. The periwound has tenderness on palpation. Assessment Active Problems ICD-10 L02.211 - Cutaneous abscess of abdominal wall T81.31XD - Disruption of external operation (surgical) wound, not elsewhere classified, subsequent encounter Plan Wound Cleansing: Wound #1 Left Groin: Clean wound with Normal Saline. Anesthetic (add to Medication List): Wound #1 Left Groin: Topical Lidocaine 4% cream applied to wound bed prior to debridement (In Clinic Only). Benzocaine Topical Anesthetic Spray applied to wound bed prior to debridement (In Clinic Only). Primary Wound Dressing: Wound #1 Left Groin: Silver Alginate - pack lightly into tunnel at 7:00 Secondary Dressing: Wound #1 Left Groin: Boardered Foam Dressing Drawtex Dressing Change Frequency: Wound #1 Left Groin: Change dressing every day. Follow-up Appointments: Wound #1 Left Groin: Return Appointment in 1 week. Additional Orders / Instructions: Wound #1 Left Groin: KAYE, LUOMA ([id number]) Stop Smoking Medications-please add to medication list.: Wound #1 Left Groin: P.O. Antibiotics - continue abx #1 I'm going to  continue with silver alginate #2 Augmentin 875 one by mouth twice a day for 7 days [Enterococcus faecalis] #3 there does not appear to be any surrounding infection here. #4 her friend is helping her with the dressings Electronic Signature(s) Signed: 04/06/2018 4:28:35 PM By: Baltazar Najjar MD Entered By: Baltazar Najjar on 04/06/2018 12:22:30 Debra [name] ([id number]) -------------------------------------------------------------------------------- SuperBill Details Patient Name: [name]  A. Date of Service: 04/06/2018 Medical Record Number: [id number] Patient Account Number: [id number] Date of Birth/Sex: 1961-03-14 (57 y.o. F) Treating RN: Curtis Sites Primary Care Provider: Baruch Gouty Other Clinician: Referring Provider: Baruch Gouty Treating Provider/Extender: Altamese [place] in Treatment: 1 Diagnosis Coding ICD-10 Codes Code Description L02.211 Cutaneous abscess of abdominal wall T81.31XD Disruption of external operation (surgical) wound, not elsewhere classified, subsequent encounter Facility Procedures CPT4 Code: 40981191 Description: [phone] - WOUND CARE VISIT-LEV 2 EST PT Modifier: Quantity: 1 Physician Procedures CPT4: Description Modifier Quantity Code [id number] - WC PHYS LEVEL 3 - EST PT 1 ICD-10 Diagnosis Description L02.211 Cutaneous abscess of abdominal wall T81.31XD Disruption of external operation (surgical) wound, not elsewhere classified, subsequent  encounter Electronic Signature(s) Signed: 04/06/2018 4:28:35 PM By: Baltazar Najjar MD Entered By: Baltazar Najjar on 04/06/2018 12:23:52

## 2018-04-13 ENCOUNTER — Encounter: Payer: Managed Care, Other (non HMO) | Admitting: Internal Medicine

## 2018-04-13 DIAGNOSIS — T8131XA Disruption of external operation (surgical) wound, not elsewhere classified, initial encounter: Secondary | ICD-10-CM | POA: Diagnosis not present

## 2018-04-15 ENCOUNTER — Other Ambulatory Visit: Payer: Self-pay | Admitting: Family Medicine

## 2018-04-16 NOTE — Progress Notes (Signed)
Debra [name], Debra [name] ([id number]) Visit Report for 04/13/2018 HPI Details Patient Name: Debra [name], Debra [name]. Date of Service: 04/13/2018 1:15 PM Medical Record Number: [id number] Patient Account Number: [id number] Date of Birth/Sex: [birth year] (57 y.o. 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 She tells me everything was fine up until a week ago. She noticed pain in her left groin area. By Saturday this it started to drain. She applied black salve to this area which she has done in the past and is helped heal problems in this area. This did not work. She was seen in the ER on 03/27/18 with an abscess in the left groin. Noted to have surrounding cellulitis and an indurated area. She underwent an IandD. She was started on patient clindamycin. Her white count was 11.9. Comprehensive metabolic panel normal lactic acid level normal at 1.2. They did not do a culture that I can see. She was seen yesterday at her primary physician's office she may have been put on cephalexin at that point. She is using iodoform packing The patient is a type II diabetic with a history of PAD last hemoglobin A1c of 7.3. She has a history of parkinsonism, COPD, bipolar disorder hypertension. She is a continued 1 pack per day smoker She tells me that she does have a history of recurrent abscesses in this area last about a year ago. Mostly she seems to care for these herself and they seem to go away. 04/06/18; patient's surgical IandD on the lower left abdominal quadrant just above the symphysis pubis appears better. Following is better we've been using silver alginate. Culture I did last week did not grow MRSA rather Enterococcus faecalis.  I substituted Augmentin for the clindamycin however the patient didn't have enough money, however she will pick it up this morning 04/13/18; surgical IandD on the lower left abdominal quadrant just above the symphysis pubis. Surface of this area looks healthy. This is an oval-shaped wound lying horizontally. Medially it has A, all with about 0.9 cm in depth there is no drainage no surrounding erythema. She is completing the Augmentin I gave her last week Electronic Signature(s) Signed: 04/14/2018 1:16:01 PM By: Baltazar Najjar MD Entered By: Baltazar Najjar on 04/13/2018 13:52:26 Debra [name] ([id number]) -------------------------------------------------------------------------------- Physical Exam Details Patient Name: Debra [name] Date of Service: 04/13/2018 1:15 PM Medical Record Number: [id number] Patient Account Number: [id number] Date of Birth/Sex: 02/25/1961 (57 y.o. F) Treating RN: Huel Coventry Primary Care Provider: Baruch Gouty Other Clinician: Referring Provider: Baruch Gouty Treating Provider/Extender: Altamese [place] in Treatment: 2 Constitutional Sitting or standing Blood Pressure is within target range for patient.. Pulse regular and within target range for patient.[name] Respirations regular, non-labored and within target range.. Temperature is normal and within the target range for the patient.[name] appears in no distress. Respiratory Respiratory effort is easy and symmetric bilaterally. Rate is normal at rest and on room air.. Gastrointestinal (GI) Abdomen is soft and non-distended without masses or tenderness. Bowel sounds active in all quadrants.. No liver or spleen enlargement or tenderness.. Integumentary (Hair, Skin) I see no primary rash issues.[name] Psychiatric No evidence of depression, anxiety, or agitation. Calm, cooperative, and communicative. Appropriate interactions and affect.. Notes wound exam; the area in question is in the right lower  abdominal quadrant just  superior to her symphysis pubis. There is lymphedema in her pannus in the area. There is no surrounding erythema. No evidence of infection currently. At about 9:00 she has 0.9 cm tunneling but there is no purulence here. The base of her wound looks very healthy Electronic Signature(s) Signed: 04/14/2018 1:16:01 PM By: Baltazar Najjar MD Entered By: Baltazar Najjar on 04/13/2018 13:54:06 Debra [name] ([id number]) -------------------------------------------------------------------------------- Physician Orders Details Patient Name: Debra [name] Date of Service: 04/13/2018 1:15 PM Medical Record Number: [id number] Patient Account Number: [id number] Date of Birth/Sex: 01-31-1961 (57 y.o. F) Treating RN: Huel Coventry Primary Care Provider: Baruch Gouty Other Clinician: Referring Provider: Baruch Gouty Treating Provider/Extender: Altamese [place] in Treatment: 2 Verbal / Phone Orders: No Diagnosis Coding Wound Cleansing Wound #1 Left Groin o Clean wound with Normal Saline. Anesthetic (add to Medication List) Wound #1 Left Groin o Topical Lidocaine 4% cream applied to wound bed prior to debridement (In Clinic Only). Primary Wound Dressing Wound #1 Left Groin o Silver Alginate - pack lightly into tunnel at 7:00 Secondary Dressing Wound #1 Left Groin o Boardered Foam Dressing Dressing Change Frequency Wound #1 Left Groin o Change dressing every day. Follow-up Appointments Wound #1 Left Groin o Return Appointment in 1 week. Additional Orders / Instructions Wound #1 Left Groin o Stop Smoking Medications-please add to medication list. Wound #1 Left Groin o P.O. Antibiotics - continue abx Electronic Signature(s) Signed: 04/13/2018 5:47:31 PM By: Elliot Gurney, BSN, RN, CWS, Shaasia RN, BSN Signed: 04/14/2018 1:16:01 PM By: Baltazar Najjar MD Entered By: Elliot Gurney, BSN, RN, CWS, Verleen on 04/13/2018 13:43:40 Debra [name]  ([id number]) -------------------------------------------------------------------------------- Problem List Details Patient Name: Debra [name], Debra [name]. Date of Service: 04/13/2018 1:15 PM Medical Record Number: [id number] Patient Account Number: [id number] Date of Birth/Sex: [birth year] (57 y.o. F) Treating RN: Huel Coventry Primary Care Provider: Baruch Gouty Other Clinician: Referring Provider: Baruch Gouty Treating Provider/Extender: Altamese [place] in Treatment: 2 Active Problems ICD-10 Impacting Encounter Code Description Active Date Wound Healing Diagnosis L02.211 Cutaneous abscess of abdominal wall 03/30/2018 Yes T81.31XD Disruption of external operation (surgical) wound, not 03/30/2018 Yes elsewhere classified, subsequent encounter Inactive Problems Resolved Problems Electronic Signature(s) Signed: 04/14/2018 1:16:01 PM By: Baltazar Najjar MD Entered By: Baltazar Najjar on 04/13/2018 13:45:00 Debra [name] ([id number]) -------------------------------------------------------------------------------- Progress Note Details Patient Name: Debra [name] Date of Service: 04/13/2018 1:15 PM Medical Record Number: [id number] Patient Account Number: [id number] Date of Birth/Sex: 28-Feb-1961 (57 y.o. 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 She tells me everything was fine up until a week ago. She noticed pain in her left groin area. By Saturday this it started to drain. She applied black salve to this area which she has done in the past and is helped heal problems in this area. This did not work. She was seen in the ER on 03/27/18 with an abscess in the left groin. Noted to have surrounding cellulitis  and an indurated area. She underwent an IandD. She was started on patient clindamycin. Her white count was 11.9. Comprehensive metabolic panel normal lactic acid level normal at 1.2. They did not do a culture that I can see. She was seen yesterday at her primary physician's office she may have been  put on cephalexin at that point. She is using iodoform packing The patient is a type II diabetic with a history of PAD last hemoglobin A1c of 7.3. She has a history of parkinsonism, COPD, bipolar disorder hypertension. She is a continued 1 pack per day smoker She tells me that she does have a history of recurrent abscesses in this area last about a year ago. Mostly she seems to care for these herself and they seem to go away. 04/06/18; patient's surgical IandD on the lower left abdominal quadrant just above the symphysis pubis appears better. Following is better we've been using silver alginate. Culture I did last week did not grow MRSA rather Enterococcus faecalis. I substituted Augmentin for the clindamycin however the patient didn't have enough money, however she will pick it up this morning 04/13/18; surgical IandD on the lower left abdominal quadrant just above the symphysis pubis. Surface of this area looks healthy. This is an oval-shaped wound lying horizontally. Medially it has A, all with about 0.9 cm in depth there is no drainage no surrounding erythema. She is completing the Augmentin I gave her last week Objective Constitutional Sitting or standing Blood Pressure is within target range for patient.. Pulse regular and within target range for patient.[name] Respirations regular, non-labored and within target range.. Temperature is normal and within the target range for the patient.[name] appears in no distress. Vitals Time Taken: 1:18 PM, Height: 65 in, Weight: 230 lbs, BMI: 38.3, Temperature: 97.8 F, Pulse: 77 bpm, Respiratory Rate: 18 breaths/min, Blood Pressure: 127/66 mmHg. Respiratory Respiratory  effort is easy and symmetric bilaterally. Rate is normal at rest and on room air.. Gastrointestinal (GI) Abdomen is soft and non-distended without masses or tenderness. Bowel sounds active in all quadrants.. No liver or spleen enlargement or tenderness.[name] Debra [name] ([id number]) Psychiatric No evidence of depression, anxiety, or agitation. Calm, cooperative, and communicative. Appropriate interactions and affect.. General Notes: wound exam; the area in question is in the right lower abdominal quadrant just superior to her symphysis pubis. There is lymphedema in her pannus in the area. There is no surrounding erythema. No evidence of infection currently. At about 9:00 she has 0.9 cm tunneling but there is no purulence here. The base of her wound looks very healthy Integumentary (Hair, Skin) I see no primary rash issues.. Wound #1 status is Open. Original cause of wound was Gradually Appeared. The wound is located on the Left Groin. The wound measures 1.7cm length x 3.3cm width x 0.4cm depth; 4.406cm^2 area and 1.762cm^3 volume. There is Fat Layer (Subcutaneous Tissue) Exposed exposed. There is no undermining noted, however, there is tunneling at 9:00 with a maximum distance of 0.9cm. There is a large amount of serosanguineous drainage noted. The wound margin is distinct with the outline attached to the wound base. There is medium (34-66%) red granulation within the wound bed. There is a medium (34-66%) amount of necrotic tissue within the wound bed including Eschar and Adherent Slough. The periwound skin appearance exhibited: Erythema. The periwound skin appearance did not exhibit: Callus, Crepitus, Excoriation, Induration, Rash, Scarring, Dry/Scaly, Maceration, Atrophie Blanche, Cyanosis, Ecchymosis, Hemosiderin Staining, Mottled, Pallor, Rubor. The surrounding wound skin color is noted with erythema which is circumferential. Periwound temperature was noted as Hot. The periwound has tenderness  on palpation. Assessment Active Problems ICD-10 L02.211 - Cutaneous abscess of abdominal wall T81.31XD - Disruption of external operation (surgical) wound, not elsewhere classified, subsequent encounter Plan Wound Cleansing: Wound #1 Left Groin: Clean wound with Normal Saline. Anesthetic (  add to Medication List): Wound #1 Left Groin: Topical Lidocaine 4% cream applied to wound bed prior to debridement (In Clinic Only). Primary Wound Dressing: Wound #1 Left Groin: Silver Alginate - pack lightly into tunnel at 7:00 Secondary Dressing: Wound #1 Left Groin: Boardered Foam Dressing Dressing Change Frequency: Wound #1 Left Groin: Change dressing every day. Follow-up Appointments: Wound #1 Left Groin: Debra [name], Debra [name] ([id number]) Return Appointment in 1 week. Additional Orders / Instructions: Wound #1 Left Groin: Stop Smoking Medications-please add to medication list.: Wound #1 Left Groin: P.O. Antibiotics - continue abx #1I didn't change the primary dressing here which was silver alginate and border foam #2 need to be careful and packing the area of the tunnel which measures a little less than a centimeter today #3 I don't think she requires any more cultures or additional antibiotics Electronic Signature(s) Signed: 04/14/2018 1:16:01 PM By: Baltazar Najjar MD Entered By: Baltazar Najjar on 04/13/2018 13:55:06 Debra [name] ([id number]) -------------------------------------------------------------------------------- SuperBill Details Patient Name: Debra [name]. Date of Service: 04/13/2018 Medical Record Number: [id number] Patient Account Number: [id number] Date of Birth/Sex: May 17, 1961 (57 y.o. F) Treating RN: Huel Coventry Primary Care Provider: Baruch Gouty Other Clinician: Referring Provider: Baruch Gouty Treating Provider/Extender: Altamese [place] in Treatment: 2 Diagnosis Coding ICD-10 Codes Code Description L02.211 Cutaneous abscess of abdominal wall T81.31XD  Disruption of external operation (surgical) wound, not elsewhere classified, subsequent encounter Facility Procedures CPT4 Code: [id number] Description: [id number] - WOUND CARE VISIT-LEV 2 EST PT Modifier: Quantity: 1 Physician Procedures CPT4 Code: [id number] Description: 99213 - WC PHYS LEVEL 3 - EST PT ICD-10 Diagnosis Description L02.211 Cutaneous abscess of abdominal wall Modifier: Quantity: 1 Electronic Signature(s) Signed: 04/14/2018 1:16:01 PM By: Baltazar Najjar MD Entered By: Baltazar Najjar on 04/13/2018 13:55:31

## 2018-04-19 ENCOUNTER — Emergency Department: Payer: Managed Care, Other (non HMO)

## 2018-04-19 ENCOUNTER — Other Ambulatory Visit: Payer: Self-pay

## 2018-04-19 ENCOUNTER — Encounter: Payer: Self-pay | Admitting: Emergency Medicine

## 2018-04-19 ENCOUNTER — Emergency Department
Admission: EM | Admit: 2018-04-19 | Discharge: 2018-04-19 | Disposition: A | Discharge status: Home / Self Care | Payer: Managed Care, Other (non HMO) | Attending: Emergency Medicine | Admitting: Emergency Medicine

## 2018-04-19 DIAGNOSIS — E119 Type 2 diabetes mellitus without complications: Secondary | ICD-10-CM | POA: Diagnosis not present

## 2018-04-19 DIAGNOSIS — I10 Essential (primary) hypertension: Secondary | ICD-10-CM | POA: Insufficient documentation

## 2018-04-19 DIAGNOSIS — A0472 Enterocolitis due to Clostridium difficile, not specified as recurrent: Secondary | ICD-10-CM | POA: Diagnosis not present

## 2018-04-19 DIAGNOSIS — F17210 Nicotine dependence, cigarettes, uncomplicated: Secondary | ICD-10-CM | POA: Insufficient documentation

## 2018-04-19 DIAGNOSIS — Z79899 Other long term (current) drug therapy: Secondary | ICD-10-CM | POA: Insufficient documentation

## 2018-04-19 DIAGNOSIS — Z794 Long term (current) use of insulin: Secondary | ICD-10-CM | POA: Diagnosis not present

## 2018-04-19 DIAGNOSIS — G20 Parkinson's disease: Secondary | ICD-10-CM | POA: Insufficient documentation

## 2018-04-19 DIAGNOSIS — R1032 Left lower quadrant pain: Secondary | ICD-10-CM | POA: Diagnosis present

## 2018-04-19 LAB — GASTROINTESTINAL PANEL BY PCR, STOOL (REPLACES STOOL CULTURE)

## 2018-04-19 LAB — C DIFFICILE QUICK SCREEN W PCR REFLEX
C DIFFICILE (CDIFF) INTERP: DETECTED
C DIFFICILE (CDIFF) TOXIN: POSITIVE — AB
C DIFFICLE (CDIFF) ANTIGEN: POSITIVE — AB

## 2018-04-19 LAB — CBC
HEMATOCRIT: 44.9 % (ref 35.0–47.0)
Hemoglobin: 15.2 g/dL (ref 12.0–16.0)
MCH: 31.6 pg (ref 26.0–34.0)
MCHC: 33.9 g/dL (ref 32.0–36.0)
MCV: 93 fL (ref 80.0–100.0)
PLATELETS: 307 10*3/uL (ref 150–440)
RBC: 4.83 MIL/uL (ref 3.80–5.20)
RDW: 14.1 % (ref 11.5–14.5)
WBC: 17.3 10*3/uL — AB (ref 3.6–11.0)

## 2018-04-19 LAB — COMPREHENSIVE METABOLIC PANEL
ALBUMIN: 4.1 g/dL (ref 3.5–5.0)
ALT: 29 U/L (ref 14–54)
AST: 33 U/L (ref 15–41)
Alkaline Phosphatase: 76 U/L (ref 38–126)
Anion gap: 11 (ref 5–15)
BILIRUBIN TOTAL: 0.9 mg/dL (ref 0.3–1.2)
BUN: 18 mg/dL (ref 6–20)
CHLORIDE: 104 mmol/L (ref 101–111)
CO2: 23 mmol/L (ref 22–32)
Calcium: 9.2 mg/dL (ref 8.9–10.3)
Creatinine, Ser: 0.97 mg/dL (ref 0.44–1.00)
GFR calc Af Amer: >60 mL/min (ref >60)
GLUCOSE: 143 mg/dL — AB (ref 65–99)
Potassium: 3.8 mmol/L (ref 3.5–5.1)
Sodium: 138 mmol/L (ref 135–145)
Total Protein: 7.4 g/dL (ref 6.5–8.1)

## 2018-04-19 LAB — LIPASE, BLOOD: Lipase: 21 U/L (ref 11–51)

## 2018-04-19 MED ORDER — METRONIDAZOLE 500 MG PO TABS: 500.0000 mg | ORAL_TABLET | Freq: Once | ORAL | Status: DC

## 2018-04-19 MED ORDER — VANCOMYCIN 50 MG/ML ORAL SOLUTION
125.0000 mg | Freq: Once | ORAL | Status: AC
  Administered 2018-04-19: 125 mg via ORAL
  Filled 2018-04-19: qty 2.5

## 2018-04-19 MED ORDER — ONDANSETRON HCL 4 MG/2ML IJ SOLN
4.0000 mg | Freq: Once | INTRAMUSCULAR | Status: AC | PRN
  Administered 2018-04-19: 4 mg via INTRAVENOUS
  Filled 2018-04-19: qty 2

## 2018-04-19 MED ORDER — FENTANYL CITRATE (PF) 100 MCG/2ML IJ SOLN
50.0000 ug | Freq: Once | INTRAMUSCULAR | Status: AC
Start: 2018-04-19 — End: 2018-04-19
  Administered 2018-04-19: 50 ug via INTRAVENOUS
  Filled 2018-04-19: qty 2

## 2018-04-19 MED ORDER — SODIUM CHLORIDE 0.9 % IV BOLUS
1000.0000 mL | Freq: Once | INTRAVENOUS | Status: AC
  Administered 2018-04-19: 1000 mL via INTRAVENOUS

## 2018-04-19 MED ORDER — VANCOMYCIN HCL 125 MG PO CAPS: 125.0000 mg | ORAL_CAPSULE | Freq: Four times a day (QID) | ORAL | 0 refills | Status: AC

## 2018-04-19 NOTE — ED Provider Notes (Addendum)
[facility] Emergency Department Provider Note  ____________________________________________   I have reviewed the triage vital signs and the nursing notes. Where available I have reviewed prior notes and, if possible and indicated, outside hospital notes.    HISTORY  Chief Complaint Abdominal Pain; Constipation; and Emesis    HPI Debra [name] is a 57 y.o. female with a history of chronic recurrent abdominal pain, who is had several  CT scans of her abdomen pelvis at this facility in the last 3 or so years, presents today complaining of left lower quadrant abdominal pain and diarrhea.  Nonbloody nonbilious.  She states that she has abdominal cramping.  Multiple episodes of "slimy" nonbloody diarrhea.  Cramping associated with diarrhea.  No fevers no vomiting.  Patient states that she is just finished up with 2 rounds of antibiotics for an abscess which is gone.  She states she was on Clinda and then amoxicillin.  She states she was taking probiotics.  She has been having pain for last 2 to 3 days.  Always associate with diarrhea, always in the left lower quadrant, no other alleviating or aggravating symptoms, no other associated symptoms no other prior treatment.  Feels dehydrated.  Denies constipation but does states she is having rectal spasm which makes her feel as if something stuck in her but really it is uncomfortable to have diarrhea.     Past Medical History:  Diagnosis Date  . Anxiety   . Arthritis   . Depression   . Diabetes mellitus without complication (HCC)    Type II  . Family history of adverse reaction to anesthesia    Mother - BP drops  . Head injury, closed, with brief LOC (HCC) 2011ish  . Hyperlipidemia   . Hypertension   . Insomnia   . Myalgia 03/29/2018   Due to statin  . Obesity   . Parkinson's disease (HCC)   . Seasonal allergies   . Tobacco abuse     Patient Active Problem List   Diagnosis Date Noted  . Myalgia 03/29/2018  .  Osteopenia 03/16/2018  . Type 2 diabetes with decreased circulation (HCC) 03/04/2018  . Abdominal aortic atherosclerosis (HCC) 03/04/2018  . Adverse reaction to statin medication 03/04/2018  . Uncontrolled diabetes mellitus type 2 with atherosclerosis of arteries of extremities (HCC) 03/04/2018  . Hyperlipidemia 04/20/2017  . Acute low back pain with radicular symptoms, duration less than 6 weeks 08/28/2015  . Lumbar disc disease with radiculopathy 08/20/2015  . Lumbar herniated disc 08/20/2015  . Uncontrolled diabetes mellitus (HCC) 08/20/2015  . Left-sided low back pain with sciatica 06/03/2015  . Bipolar disorder (HCC) 06/03/2015  . Insomnia, persistent 06/03/2015  . BP (high blood pressure) 06/03/2015  . Leg swelling 06/03/2015  . Extreme obesity 06/03/2015  . Compulsive tobacco user syndrome 06/03/2015  . Phlebectasia 06/03/2015    Past Surgical History:  Procedure Laterality Date  . ABDOMINAL HYSTERECTOMY  2012  . BREAST BIOPSY Right   . CESAREAN SECTION  1992  . CHOLECYSTECTOMY  2013  . LUMBAR LAMINECTOMY/DECOMPRESSION MICRODISCECTOMY Left 09/10/2015   Procedure: Left L4-5 Foraminotomy/Left L5-S1 Diskectomy;  Surgeon: Hilda Lias, MD;  Location: [facility];  Service: Neurosurgery;  Laterality: Left;  Left L4-5 Foraminotomy/Left L5-S1 Diskectomy    Prior to Admission medications   Medication Sig Start Date End Date Taking? Authorizing Provider  ACCU-CHEK SMARTVIEW test strip  08/18/15   [provider]  Alcohol Swabs (ALCOHOL PREP) PADS  08/18/15   [provider]  aspirin EC 81 MG tablet Take 81 mg by mouth daily.    [provider]  calcium carbonate (OSCAL) 1500 (600 Ca) MG TABS tablet Take by mouth 2 (two) times daily with a meal.    [provider]  Cholecalciferol 5000 units capsule Take 5,000 Units by mouth.     [provider]  Colesevelam HCl 3.75 g PACK Take 0.5 packets by mouth 2 (two) times daily with a meal. Mix in  4-8 ounces of water, fruit juice, or diet soft drink; for chol and diabetes 03/29/18   Kerman Passey, MD  ezetimibe (ZETIA) 10 MG tablet Take 1 tablet (10 mg total) by mouth daily. 03/04/18   Kerman Passey, MD  folic acid (FOLVITE) 400 MCG tablet Take 800 mcg by mouth daily.     [provider]  glucose blood (ONE TOUCH ULTRA TEST) test strip Check fingerstick blood sugars three times a day; LON 99 months; Dx E11.65 02/15/18   Kerman Passey, MD  hydrochlorothiazide (HYDRODIURIL) 25 MG tablet Take 1 tablet (25 mg total) by mouth daily. 03/04/18   Lada, Janit Bern, MD  HYDROcodone-acetaminophen (NORCO) 5-325 MG tablet Take 1 tablet by mouth every 6 (six) hours as needed for up to 7 doses for severe pain. 03/27/18   Merrily Brittle, MD  Insulin Lispro (HUMALOG KWIKPEN) 200 UNIT/ML SOPN Inject 28 Units into the skin 3 (three) times daily with meals. Breakfast and lunch, and just 35 units at supper 03/04/18   Lada, Janit Bern, MD  Insulin Pen Needle (BD ULTRA-FINE PEN NEEDLES) 29G X 12.7MM MISC USE THREE TIMES DAILY AS DIRECTED 11/12/17   [provider]  lisinopril (PRINIVIL,ZESTRIL) 40 MG tablet TAKE 1 TABLET BY MOUTH EVERY DAY 04/15/18   Lada, Janit Bern, MD  Magnesium 400 MG CAPS Take 800 mg by mouth daily.    [provider]  Melatonin 10 MG TABS Take 1 tablet by mouth at bedtime.    [provider]  Multiple Vitamins-Minerals (CENTRUM WOMEN) TABS Take 1 tablet by mouth.    [provider]  nystatin (NYSTATIN) powder Apply topically 4 (four) times daily. To affected skin Patient taking differently: Apply topically as needed. To affected skin 02/15/18   Lada, Janit Bern, MD  [facility] DELICA LANCETS 33G MISC  02/07/18   [provider]  PARoxetine (PAXIL) 20 MG tablet Take 1 tablet (20 mg total) by mouth daily. 02/22/18   Lada, Janit Bern, MD  pioglitazone (ACTOS) 30 MG tablet Take 1 tablet (30 mg total) by mouth daily. 03/29/18   Lada, Janit Bern, MD   propranolol ER (INDERAL LA) 80 MG 24 hr capsule TAKE 1 CAPSULE (80 MG TOTAL) BY MOUTH ONCE DAILY 12/10/17   [provider]  traZODone (DESYREL) 50 MG tablet Take 1 tablet (50 mg total) by mouth at bedtime. Patient not taking: Reported on 03/29/2018 07/08/17   Ellyn Hack, MD  TRESIBA FLEXTOUCH 200 UNIT/ML SOPN Inject 130 Units into the skin at bedtime. Patient taking differently: Inject 110 Units into the skin at bedtime.  03/04/18   Kerman Passey, MD    Allergies Metformin and related; Benadryl [diphenhydramine hcl]; Canagliflozin; Depakote er [divalproex sodium er]; Dilaudid [hydromorphone hcl]; Diphenhydramine-zinc acetate; Haloperidol; Hydromorphone; Neosporin [neomycin-bacitracin zn-polymyx]; Singulair [montelukast sodium]; Sitagliptin; Statins; Sulfa antibiotics; and Victoza [liraglutide]  Family History  Problem Relation Age of Onset  . COPD Mother   . Bipolar disorder Father   . Diabetes Mellitus II Father   .  Bipolar disorder Brother   . Diabetes Mellitus II Brother   . Bipolar disorder Brother     Social History Social History   Tobacco Use  . Smoking status: Current Every Day Smoker    Packs/day: 0.50    Years: 24.00    Pack years: 12.00    Types: Cigarettes  . Smokeless tobacco: Never Used  Substance Use Topics  . Alcohol use: Yes    Alcohol/week: 0.0 oz    Comment: occasional- rare  . Drug use: No    Review of Systems Constitutional: No fever/chills Eyes: No visual changes. ENT: No sore throat. No stiff neck no neck pain Cardiovascular: Denies chest pain. Respiratory: Denies shortness of breath. Gastrointestinal:   no vomiting.  No diarrhea.  No constipation. Genitourinary: Negative for dysuria. Musculoskeletal: Negative lower extremity swelling Skin: Negative for rash. Neurological: Negative for severe headaches, focal weakness or numbness.   ____________________________________________   PHYSICAL EXAM:  VITAL SIGNS: ED Triage Vitals  [04/19/18 1238]  Enc Vitals Group     BP 119/70     Pulse Rate 85     Resp 18     Temp 97.9 F (36.6 C)     Temp Source Oral     SpO2 96 %     Weight 252 lb (114.3 kg)     Height  (1.651 m)     Head Circumference      Peak Flow      Pain Score 8     Pain Loc      Pain Edu?      Excl. in GC?     Constitutional: Alert and oriented. Well appearing and in no acute distress. Eyes: Conjunctivae are normal Head: Atraumatic HEENT: No congestion/rhinnorhea. Mucous membranes are moist.  Oropharynx non-erythematous Neck:   Nontender with no meningismus, no masses, no stridor Cardiovascular: Normal rate, regular rhythm. Grossly normal heart sounds.  Good peripheral circulation. Respiratory: Normal respiratory effort.  No retractions. Lungs CTAB. Abdominal: Obesity noted, nonsurgical abdomen, minimal left lower quadrant discomfort. No distention. No guarding no rebound Back:  There is no focal tenderness or step off.  there is no midline tenderness there are no lesions noted. there is no CVA tenderness Musculoskeletal: No lower extremity tenderness, no upper extremity tenderness. No joint effusions, no DVT signs strong distal pulses no edema Neurologic:  Normal speech and language. No gross focal neurologic deficits are appreciated.  Skin:  Skin is warm, dry and intact. No rash noted. Psychiatric: Mood and affect are normal. Speech and behavior are normal.  ____________________________________________   LABS (all labs ordered are listed, but only abnormal results are displayed)  Labs Reviewed  COMPREHENSIVE METABOLIC PANEL - Abnormal; Notable for the following components:      Result Value   Glucose, Bld 143 (*)    All other components within normal limits  CBC - Abnormal; Notable for the following components:   WBC 17.3 (*)    All other components within normal limits  GASTROINTESTINAL PANEL BY PCR, STOOL (REPLACES STOOL CULTURE)  C DIFFICILE QUICK SCREEN W PCR REFLEX   LIPASE, BLOOD  URINALYSIS, COMPLETE (UACMP) WITH MICROSCOPIC    Pertinent labs  results that were available during my care of the patient were reviewed by me and considered in my medical decision making (see chart for details). ____________________________________________  EKG  I personally interpreted any EKGs ordered by me or triage   ________________________________________ RADIOLOGY  Pertinent labs & imaging results that were available during my  care of the patient were reviewed by me and considered in my medical decision making (see chart for details). If possible, patient and/or family made aware of any abnormal findings.  No results found. ____________________________________________    PROCEDURES  Procedure(s) performed: None  Procedures  Critical Care performed: None  ____________________________________________   INITIAL IMPRESSION / ASSESSMENT AND PLAN / ED COURSE  Pertinent labs & imaging results that were available during my care of the patient were reviewed by me and considered in my medical decision making (see chart for details).  Patient presents today complaining of diarrhea for 3 days after antibiotic use, we will see if she can produce a stool sample we will check her for C. difficile, as well as bile far.  Her abdomen is nonsurgical, as noted, patient has had multiple different prior CT scans for abdominal pain all of which have been negative thus far.  Most recent was in March of this year, for upper abdominal pain and left lower quadrant pain and nausea which is not dissimilar from this presentation.  At that time, and other CAT scans, I do not see any evidence of diverticular disease noted.  Patient and I discussed repeat CT scan, and she is reluctant to have it done given the radiation burden I do not disagree with her.  She has had also CT scan of her head.  We will give her IV fluid as she feels somewhat dehydrated although clinically is not  significantly dehydrated, and we will see if we can do a rectal exam for her rectal discomfort to the rule out or evaluate for possible abscess and we will obtain stool sample  ----------------------------------------- 5:09 PM on 04/19/2018 -----------------------------------------   pt with + cdiff. Declines rectal exam.  Will start on vancomycin.  Abdomen nonsurgical, return precautions follow-up given and understood.     ____________________________________________   FINAL CLINICAL IMPRESSION(S) / ED DIAGNOSES  Final diagnoses:  None      This chart was dictated using voice recognition software.  Despite best efforts to proofread,  errors can occur which can change meaning.      Jeanmarie Plant, MD 04/19/18 1513    Jeanmarie Plant, MD 04/19/18 1709    Jeanmarie Plant, MD 04/19/18 [phone]

## 2018-04-19 NOTE — ED Triage Notes (Signed)
Says sick since Friday.  Left side abd pain and vomitign.  Says very little stool and feels like something is stuck in rectum.

## 2018-04-19 NOTE — ED Notes (Signed)
Patient transported to X-ray 

## 2018-04-19 NOTE — Discharge Instructions (Signed)
Wash your hands with soap and water every time you eat, or use the bathroom.  If you have severe abdominal pain, vomiting, fever, bleeding or you feel worse in any way return to the emergency room.  Take the antibiotics until they are gone.  Follow closely with your doctor tomorrow.  Make sure the people in your family also wash their hands on a regular basis

## 2018-04-19 NOTE — ED Notes (Signed)
Delay in discharge from computer but pt left the ED at 1820.

## 2018-04-20 ENCOUNTER — Ambulatory Visit: Payer: Managed Care, Other (non HMO) | Admitting: Internal Medicine

## 2018-04-22 ENCOUNTER — Ambulatory Visit (INDEPENDENT_AMBULATORY_CARE_PROVIDER_SITE_OTHER): Payer: Managed Care, Other (non HMO) | Admitting: Family Medicine

## 2018-04-22 ENCOUNTER — Encounter: Payer: Self-pay | Admitting: Family Medicine

## 2018-04-22 VITALS — BP 116/80 | HR 87 | Temp 98.5°F | Resp 16 | Ht 65.0 in | Wt 251.2 lb

## 2018-04-22 DIAGNOSIS — E119 Type 2 diabetes mellitus without complications: Secondary | ICD-10-CM

## 2018-04-22 DIAGNOSIS — Z794 Long term (current) use of insulin: Secondary | ICD-10-CM | POA: Diagnosis not present

## 2018-04-22 DIAGNOSIS — I70209 Unspecified atherosclerosis of native arteries of extremities, unspecified extremity: Secondary | ICD-10-CM

## 2018-04-22 DIAGNOSIS — IMO0002 Reserved for concepts with insufficient information to code with codable children: Secondary | ICD-10-CM

## 2018-04-22 DIAGNOSIS — E1151 Type 2 diabetes mellitus with diabetic peripheral angiopathy without gangrene: Secondary | ICD-10-CM | POA: Diagnosis not present

## 2018-04-22 DIAGNOSIS — I10 Essential (primary) hypertension: Secondary | ICD-10-CM | POA: Diagnosis not present

## 2018-04-22 DIAGNOSIS — A0472 Enterocolitis due to Clostridium difficile, not specified as recurrent: Secondary | ICD-10-CM

## 2018-04-22 DIAGNOSIS — E1165 Type 2 diabetes mellitus with hyperglycemia: Secondary | ICD-10-CM

## 2018-04-22 MED ORDER — TRESIBA FLEXTOUCH 200 UNIT/ML ~~LOC~~ SOPN: 110.0000 [IU] | PEN_INJECTOR | Freq: Every day | SUBCUTANEOUS | Status: DC

## 2018-04-22 MED ORDER — LISINOPRIL 40 MG PO TABS: 40.0000 mg | ORAL_TABLET | Freq: Every day | ORAL | 0 refills | Status: DC

## 2018-04-22 NOTE — Assessment & Plan Note (Signed)
Refilled the lisinopril.

## 2018-04-22 NOTE — Assessment & Plan Note (Signed)
Adjust insulin down to avoid hypoglycemia; weight loss encouraged

## 2018-04-22 NOTE — Patient Instructions (Signed)
Finish out the antibiotics Rest and hydration Check out the information at [email or web address] entitled "Nutrition for Weight Loss: What You Need to Know about Fad Diets" Try to lose between 1-2 pounds per week by taking in fewer calories and burning off more calories You can succeed by limiting portions, limiting foods dense in calories and fat, becoming more active, and drinking 8 glasses of water a day (64 ounces) Don't skip meals, especially breakfast, as skipping meals may alter your metabolism Do not use over-the-counter weight loss pills or gimmicks that claim rapid weight loss A healthy BMI (or body mass index) is between 18.5 and 24.9 You can calculate your ideal BMI at the [employer] website [employer] Call if needed

## 2018-04-22 NOTE — Progress Notes (Signed)
BP 116/80   Pulse 87   Temp 98.5 F (36.9 C) (Oral)   Resp 16   Ht  (1.651 m)   Wt 251 lb 3.2 oz (113.9 kg)   SpO2 96%   BMI 41.80 kg/m    Subjective:    Patient ID: Debra [name], female    DOB: 1960/12/26, 57 y.o.   MRN: [id number]  HPI: Debra [name] is a 57 y.o. female  Chief Complaint  Patient presents with  . Follow-up    went on tuesday was dx with c diff  . Medication Refill    HPI  C.diff Patient was dx with c.diff on Tuesday. Has been having diarrhea starting last Friday, started on antibiotic- vanc QID. No missed doses, has mild nausea, no vomiting. Patient states diarrhea has decreased- to soft stools maybe twice daily, bristol scale 5-6. Abdominal pain has significantly decreased  Hypertension  Patient is taking HCTZ 25 mg, lisinopril , propranolol ER   Daily with no missed doses.  Denies chest pain, headache, blurry vision.  BP Readings from Last 3 Encounters:  04/22/18 116/80  04/19/18 120/82  03/29/18 136/74    Diabetes Patient is taking actos  daily, tresiba 110 units at night. Fasting blood sugar this morning was 103.  Patient was taking 28 Units with every meal during illness in the last week due to decreased appetite self-lowered dose to 20 units with meals. Had one episode of blood sugar 71 (2 days ago)- had taken insulin and was eating slowly and had gotten distracted- notes she felt dizzy- had to stop eat and felt better afterwards. If she checks at night before going to bed and it's low, she'll eat something before going to sleep. Doing that almost every night, now adjusted from 130 to 110 units and now less hypoglycemia  Depression screen [facility] 2/9 04/22/2018 03/29/2018 03/04/2018 02/15/2018 01/28/2018  Decreased Interest 0 0 0 0 0  Down, Depressed, Hopeless 0 0 0 0 0  PHQ - 2 Score 0 0 0 0 0    Relevant past medical, surgical, family and social history reviewed Past Medical History:  Diagnosis Date  . Anxiety   . Arthritis   . C.  difficile colitis   . Depression   . Diabetes mellitus without complication (HCC)    Type II  . Family history of adverse reaction to anesthesia    Mother - BP drops  . Head injury, closed, with brief LOC (HCC) 2011ish  . Hyperlipidemia   . Hypertension   . Insomnia   . Myalgia 03/29/2018   Due to statin  . Obesity   . Parkinson's disease (HCC)   . Seasonal allergies   . Tobacco abuse    Past Surgical History:  Procedure Laterality Date  . ABDOMINAL HYSTERECTOMY  2012  . BREAST BIOPSY Right   . CESAREAN SECTION  1992  . CHOLECYSTECTOMY  2013  . LUMBAR LAMINECTOMY/DECOMPRESSION MICRODISCECTOMY Left 09/10/2015   Procedure: Left L4-5 Foraminotomy/Left L5-S1 Diskectomy;  Surgeon: Hilda Lias, MD;  Location: [facility];  Service: Neurosurgery;  Laterality: Left;  Left L4-5 Foraminotomy/Left L5-S1 Diskectomy   Family History  Problem Relation Age of Onset  . COPD Mother   . Bipolar disorder Father   . Diabetes Mellitus II Father   . Bipolar disorder Brother   . Diabetes Mellitus II Brother   . Bipolar disorder Brother    Social History   Tobacco Use  . Smoking status: Current Every Day Smoker  Packs/day: 0.50    Years: 24.00    Pack years: 12.00    Types: Cigarettes  . Smokeless tobacco: Never Used  Substance Use Topics  . Alcohol use: Yes    Alcohol/week: 0.0 oz    Comment: occasional- rare  . Drug use: No    Interim medical history since last visit reviewed. Allergies and medications reviewed  Review of Systems Per HPI unless specifically indicated above     Objective:    BP 116/80   Pulse 87   Temp 98.5 F (36.9 C) (Oral)   Resp 16   Ht  (1.651 m)   Wt 251 lb 3.2 oz (113.9 kg)   SpO2 96%   BMI 41.80 kg/m   Wt Readings from Last 3 Encounters:  04/22/18 251 lb 3.2 oz (113.9 kg)  04/19/18 252 lb (114.3 kg)  03/29/18 252 lb 3.2 oz (114.4 kg)    Physical Exam  Constitutional: She appears well-developed and well-nourished. No distress.    Morbidly obese  HENT:  Head: Normocephalic and atraumatic.  Eyes: EOM are normal. No scleral icterus.  Neck: No thyromegaly present.  Cardiovascular: Normal rate, regular rhythm and normal heart sounds.  No murmur heard. Pulmonary/Chest: Effort normal and breath sounds normal. No respiratory distress. She has no wheezes.  Abdominal: Soft. Bowel sounds are normal. She exhibits no distension.  Musculoskeletal: She exhibits no edema.  Neurological: She is alert. She exhibits normal muscle tone.  Skin: Skin is warm and dry. She is not diaphoretic. No pallor.  Psychiatric: She has a normal mood and affect. Her behavior is normal. Judgment and thought content normal.   Diabetic Foot Form - Detailed   Diabetic Foot Exam - detailed Diabetic Foot exam was performed with the following findings:  Yes 04/22/2018  6:07 AM  Visual Foot Exam completed.:  Yes  Pulse Foot Exam completed.:  Yes  Right Dorsalis Pedis:  Present Left Dorsalis Pedis:  Present  Sensory Foot Exam Completed.:  Yes Semmes-Weinstein Monofilament Test         Assessment & Plan:   Problem List Items Addressed This Visit      Cardiovascular and Mediastinum   Uncontrolled diabetes mellitus type 2 with atherosclerosis of arteries of extremities (HCC)    Adjust insulin down to avoid hypoglycemia; weight loss encouraged      Relevant Medications   TRESIBA FLEXTOUCH 200 UNIT/ML SOPN   lisinopril (PRINIVIL,ZESTRIL) 40 MG tablet   BP (high blood pressure)    Refilled the lisinopril      Relevant Medications   lisinopril (PRINIVIL,ZESTRIL) 40 MG tablet     Other   Morbid obesity (HCC)    Encouraged weight loss; see AVS      Relevant Medications   TRESIBA FLEXTOUCH 200 UNIT/ML SOPN    Other Visit Diagnoses    Clostridium difficile diarrhea    -  Primary   continue the vancomycin; on probiotics; symptoms resolving   Type 2 diabetes mellitus treated with insulin (HCC)       Relevant Medications   TRESIBA FLEXTOUCH  200 UNIT/ML SOPN   lisinopril (PRINIVIL,ZESTRIL) 40 MG tablet       Follow up plan: No follow-ups on file.  An after-visit summary was printed and given to the patient at check-out.  Please see the patient instructions which may contain other information and recommendations beyond what is mentioned above in the assessment and plan.  Meds ordered this encounter  Medications  . TRESIBA FLEXTOUCH 200 UNIT/ML SOPN  Sig: Inject 110 Units into the skin at bedtime.    Order Specific Question:   Supervising Provider    AnswerKerman Passey [id number]  . lisinopril (PRINIVIL,ZESTRIL) 40 MG tablet    Sig: Take 1 tablet (40 mg total) by mouth daily.    Dispense:  90 tablet    Refill:  0    Order Specific Question:   Supervising Provider    Answer:   Hellena Pridgen P [id number]    No orders of the defined types were placed in this encounter.

## 2018-04-23 ENCOUNTER — Other Ambulatory Visit: Payer: Self-pay | Admitting: Family Medicine

## 2018-04-27 ENCOUNTER — Encounter: Payer: Managed Care, Other (non HMO) | Admitting: Internal Medicine

## 2018-04-27 DIAGNOSIS — T8131XA Disruption of external operation (surgical) wound, not elsewhere classified, initial encounter: Secondary | ICD-10-CM | POA: Diagnosis not present

## 2018-04-28 NOTE — Progress Notes (Signed)
CALE, DECAROLIS ([id number]) Visit Report for 04/27/2018 Arrival Information Details Patient Name: Debra [name], Debra [name]. Date of Service: 04/27/2018 1:45 PM Medical Record Number: [id number] Patient Account Number: [id number] Date of Birth/Sex: 1961-02-17 (57 y.o. F) Treating RN: Curtis Sites Primary Care Neave Lenger: Baruch Gouty Other Clinician: Referring Liz Pinho: Baruch Gouty Treating Hermilo Dutter/Extender: Altamese [place] in Treatment: 4 Visit Information History Since Last Visit Added or deleted any medications: No Patient Arrived: Ambulatory Any new allergies or adverse reactions: No Arrival Time: 13:52 Had a fall or experienced change in No Accompanied By: friend activities of daily living that may affect Transfer Assistance: None risk of falls: Patient Identification Verified: Yes Signs or symptoms of abuse/neglect since last visito No Secondary Verification Process Completed: Yes Hospitalized since last visit: No Patient Requires Transmission-Based No Implantable device outside of the clinic excluding No Precautions: cellular tissue based products placed in the center Patient Has Alerts: Yes since last visit: Patient Alerts: DM II Has Dressing in Place as Prescribed: Yes Pain Present Now: No Electronic Signature(s) Signed: 04/27/2018 4:55:05 PM By: Curtis Sites Entered By: Curtis Sites on 04/27/2018 13:53:08 Debra [name] ([id number]) -------------------------------------------------------------------------------- Clinic Level of Care Assessment Details Patient Name: Debra [name] Date of Service: 04/27/2018 1:45 PM Medical Record Number: [id number] Patient Account Number: [id number] Date of Birth/Sex: 1961/08/26 (57 y.o. F) Treating RN: Huel Coventry Primary Care Dominic Mahaney: Baruch Gouty Other Clinician: Referring Katisha Shimizu: Baruch Gouty Treating Morry Veiga/Extender: Altamese [place] in Treatment: 4 Clinic Level of Care Assessment Items TOOL 4 Quantity Score  -  Use when only an EandM is performed on FOLLOW-UP visit 0 ASSESSMENTS - Nursing Assessment / Reassessment  - Reassessment of Co-morbidities (includes updates in patient status) 0 X- 1 5 Reassessment of Adherence to Treatment Plan ASSESSMENTS - Wound and Skin Assessment / Reassessment X - Simple Wound Assessment / Reassessment - one wound 1 5  - 0 Complex Wound Assessment / Reassessment - multiple wounds  - 0 Dermatologic / Skin Assessment (not related to wound area) ASSESSMENTS - Focused Assessment  - Circumferential Edema Measurements - multi extremities 0  - 0 Nutritional Assessment / Counseling / Intervention  - 0 Lower Extremity Assessment (monofilament, tuning fork, pulses)  - 0 Peripheral Arterial Disease Assessment (using hand held doppler) ASSESSMENTS - Ostomy and/or Continence Assessment and Care  - Incontinence Assessment and Management 0  - 0 Ostomy Care Assessment and Management (repouching, etc.) PROCESS - Coordination of Care X - Simple Patient / Family Education for ongoing care 1 15  - 0 Complex (extensive) Patient / Family Education for ongoing care  - 0 Staff obtains Chiropractor, Records, Test Results / Process Orders  - 0 Staff telephones HHA, Nursing Homes / Clarify orders / etc  - 0 Routine Transfer to another Facility (non-emergent condition)  - 0 Routine Hospital Admission (non-emergent condition)  - 0 New Admissions / Manufacturing engineer / Ordering NPWT, Apligraf, etc.  - 0 Emergency Hospital Admission (emergent condition) X- 1 10 Simple Discharge Coordination Debra [name], Debra [name] ([id number])  - 0 Complex (extensive) Discharge Coordination PROCESS - Special Needs  - Pediatric / Minor Patient Management 0  - 0 Isolation Patient Management  - 0 Hearing / Language / Visual special needs  - 0 Assessment of Community assistance (transportation, D/C planning, etc.)  - 0 Additional assistance / Altered  mentation  - 0 Support Surface(s) Assessment (bed, cushion, seat, etc.) INTERVENTIONS - Wound Cleansing / Measurement X - Simple Wound Cleansing - one wound 1 5  -  0 Complex Wound Cleansing - multiple wounds X- 1 5 Wound Imaging (photographs - any number of wounds)  - 0 Wound Tracing (instead of photographs) X- 1 5 Simple Wound Measurement - one wound  - 0 Complex Wound Measurement - multiple wounds INTERVENTIONS - Wound Dressings  - Small Wound Dressing one or multiple wounds 0 X- 1 15 Medium Wound Dressing one or multiple wounds  - 0 Large Wound Dressing one or multiple wounds  - 0 Application of Medications - topical  - 0 Application of Medications - injection INTERVENTIONS - Miscellaneous  - External ear exam 0  - 0 Specimen Collection (cultures, biopsies, blood, body fluids, etc.)  - 0 Specimen(s) / Culture(s) sent or taken to Lab for analysis  - 0 Patient Transfer (multiple staff / Nurse, adult / Similar devices)  - 0 Simple Staple / Suture removal (25 or less)  - 0 Complex Staple / Suture removal (26 or more)  - 0 Hypo / Hyperglycemic Management (close monitor of Blood Glucose)  - 0 Ankle / Brachial Index (ABI) - do not check if billed separately X- 1 5 Vital Signs Debra [name], Debra A. ([id number]) Has the patient been seen at the hospital within the last three years: Yes Total Score: 70 Level Of Care: New/Established - Level 2 Electronic Signature(s) Signed: 04/27/2018 5:19:09 PM By: Elliot Gurney, BSN, RN, CWS, Antanasia RN, BSN Entered By: Elliot Gurney, BSN, RN, CWS, Rokhaya on 04/27/2018 15:32:08 Debra [name] ([id number]) -------------------------------------------------------------------------------- Encounter Discharge Information Details Patient Name: Debra [name]. Date of Service: 04/27/2018 1:45 PM Medical Record Number: [id number] Patient Account Number: [id number] Date of Birth/Sex: 1961/06/16 (57 y.o. F) Treating RN: Curtis Sites Primary Care  Golden Emile: Baruch Gouty Other Clinician: Referring Yasuko Lapage: Baruch Gouty Treating Caliyah Sieh/Extender: Altamese [place] in Treatment: 4 Encounter Discharge Information Items Discharge Condition: Stable Ambulatory Status: Ambulatory Discharge Destination: Home Transportation: Private Auto Accompanied By: friend Schedule Follow-up Appointment: Yes Clinical Summary of Care: Electronic Signature(s) Signed: 04/27/2018 4:10:11 PM By: Curtis Sites Entered By: Curtis Sites on 04/27/2018 16:10:10 Debra [name] ([id number]) -------------------------------------------------------------------------------- Lower Extremity Assessment Details Patient Name: Debra [name] Date of Service: 04/27/2018 1:45 PM Medical Record Number: [id number] Patient Account Number: [id number] Date of Birth/Sex: [birth year] (57 y.o. F) Treating RN: Curtis Sites Primary Care Mekayla Soman: Baruch Gouty Other Clinician: Referring Evin Loiseau: Baruch Gouty Treating Veroncia Jezek/Extender: Altamese [place] in Treatment: 4 Electronic Signature(s) Signed: 04/27/2018 4:55:05 PM By: Curtis Sites Entered By: Curtis Sites on 04/27/2018 13:53:30 Debra [name] ([id number]) -------------------------------------------------------------------------------- Multi Wound Chart Details Patient Name: Debra [name] Date of Service: 04/27/2018 1:45 PM Medical Record Number: [id number] Patient Account Number: [id number] Date of Birth/Sex: 01-02-1961 (57 y.o. F) Treating RN: Huel Coventry Primary Care Leilynn Pilat: Baruch Gouty Other Clinician: Referring Elo Marmolejos: Baruch Gouty Treating Willye Javier/Extender: Altamese [place] in Treatment: 4 Vital Signs Height(in): 65 Pulse(bpm): 72 Weight(lbs): 230 Blood Pressure(mmHg): 117/64 Body Mass Index(BMI): 38 Temperature(F): 98.1 Respiratory Rate 16 (breaths/min): Photos: [1:No Photos] [N/A:N/A] Wound Location: [1:Left Groin] [N/A:N/A] Wounding Event: [1:Gradually Appeared]  [N/A:N/A] Primary Etiology: [1:Abscess] [N/A:N/A] Comorbid History: [1:Hypertension, Type II Diabetes, Osteoarthritis] [N/A:N/A] Date Acquired: [1:03/23/2018] [N/A:N/A] Weeks of Treatment: [1:4] [N/A:N/A] Wound Status: [1:Open] [N/A:N/A] Measurements L x W x D [1:0.6x1.1x0.2] [N/A:N/A] (cm) Area (cm) : [1:0.518] [N/A:N/A] Volume (cm) : [1:0.104] [N/A:N/A] % Reduction in Area: [1:91.10%] [N/A:N/A] % Reduction in Volume: [1:98.00%] [N/A:N/A] Position 1 (o'clock): [1:8] Maximum Distance 1 (cm): [1:0.4] Tunneling: [1:Yes] [N/A:N/A] Classification: [1:Full Thickness Without Exposed Support Structures] [N/A:N/A] Exudate Amount: [1:Large] [N/A:N/A] Exudate  Type: [1:Serosanguineous] [N/A:N/A] Exudate Color: [1:red, brown] [N/A:N/A] Wound Margin: [1:Distinct, outline attached] [N/A:N/A] Granulation Amount: [1:Large (67-100%)] [N/A:N/A] Granulation Quality: [1:Red] [N/A:N/A] Necrotic Amount: [1:Small (1-33%)] [N/A:N/A] Necrotic Tissue: [1:Eschar, Adherent Slough] [N/A:N/A] Exposed Structures: [1:Fat Layer (Subcutaneous Tissue) Exposed: Yes Fascia: No Tendon: No Muscle: No Joint: No Bone: No] [N/A:N/A] Epithelialization: [1:None] [N/A:N/A] Periwound Skin Texture: [N/A:N/A] Excoriation: No Induration: No Callus: No Crepitus: No Rash: No Scarring: No Periwound Skin Moisture: Maceration: Yes N/A N/A Dry/Scaly: No Periwound Skin Color: Ecchymosis: Yes N/A N/A Atrophie Blanche: No Cyanosis: No Erythema: No Hemosiderin Staining: No Mottled: No Pallor: No Rubor: No Temperature: Hot N/A N/A Tenderness on Palpation: Yes N/A N/A Wound Preparation: Ulcer Cleansing: N/A N/A Rinsed/Irrigated with Saline Topical Anesthetic Applied: Other: lidocaine 4% Treatment Notes Electronic Signature(s) Signed: 04/27/2018 5:10:30 PM By: Baltazar Najjar MD Previous Signature: 04/27/2018 3:31:34 PM Version By: Elliot Gurney, BSN, RN, CWS, Felipe RN, BSN Entered By: Baltazar Najjar on 04/27/2018  15:36:05 Debra [name] ([id number]) -------------------------------------------------------------------------------- Multi-Disciplinary Care Plan Details Patient Name: Debra [name] Date of Service: 04/27/2018 1:45 PM Medical Record Number: [id number] Patient Account Number: [id number] Date of Birth/Sex: January 30, 1961 (57 y.o. F) Treating RN: Huel Coventry Primary Care Abiola Behring: Baruch Gouty Other Clinician: Referring Browning [place]: Baruch Gouty Treating Terriana Barreras/Extender: Altamese [place] in Treatment: 4 Active Inactive ` Orientation to the Wound Care Program Nursing Diagnoses: Knowledge deficit related to the wound healing center program Goals: Patient/caregiver will verbalize understanding of the Wound Healing Center Program Date Initiated: 04/06/2018 Target Resolution Date: 07/16/2018 Goal Status: Active Interventions: Provide education on orientation to the wound center Notes: ` Wound/Skin Impairment Nursing Diagnoses: Impaired tissue integrity Goals: Ulcer/skin breakdown will heal within 14 weeks Date Initiated: 04/06/2018 Target Resolution Date: 07/16/2018 Goal Status: Active Interventions: Assess patient/caregiver ability to obtain necessary supplies Assess patient/caregiver ability to perform ulcer/skin care regimen upon admission and as needed Assess ulceration(s) every visit Notes: Electronic Signature(s) Signed: 04/27/2018 3:31:27 PM By: Elliot Gurney, BSN, RN, CWS, [place] RN, BSN Entered By: Elliot Gurney, BSN, RN, CWS, Breane on 04/27/2018 15:31:26 Debra [name] ([id number]) -------------------------------------------------------------------------------- Pain Assessment Details Patient Name: Debra [name]. Date of Service: 04/27/2018 1:45 PM Medical Record Number: [id number] Patient Account Number: [id number] Date of Birth/Sex: 09-Dec-1960 (57 y.o. F) Treating RN: Curtis Sites Primary Care Ned Kakar: Baruch Gouty Other Clinician: Referring Lynnel Zanetti: Baruch Gouty Treating Kunal Levario/Extender:  Altamese [place] in Treatment: 4 Active Problems Location of Pain Severity and Description of Pain Patient Has Paino No Site Locations Pain Management and Medication Current Pain Management: Electronic Signature(s) Signed: 04/27/2018 4:55:05 PM By: Curtis Sites Entered By: Curtis Sites on 04/27/2018 13:53:20 Debra [name] ([id number]) -------------------------------------------------------------------------------- Patient/Caregiver Education Details Patient Name: Debra [name]. Date of Service: 04/27/2018 1:45 PM Medical Record Number: [id number] Patient Account Number: [id number] Date of Birth/Gender: [birth year] (57 y.o. F) Treating RN: Curtis Sites Primary Care Physician: Baruch Gouty Other Clinician: Referring Physician: Baruch Gouty Treating Physician/Extender: Altamese [place] in Treatment: 4 Education Assessment Education Provided To: Patient and Caregiver Education Topics Provided Wound/Skin Impairment: Handouts: Other: wound care as ordered Methods: Demonstration, Explain/Verbal Responses: State content correctly Electronic Signature(s) Signed: 04/27/2018 4:55:05 PM By: Curtis Sites Entered By: Curtis Sites on 04/27/2018 16:10:27 Debra [name] ([id number]) -------------------------------------------------------------------------------- Wound Assessment Details Patient Name: Debra [name] Date of Service: 04/27/2018 1:45 PM Medical Record Number: [id number] Patient Account Number: [id number] Date of Birth/Sex: 1961/04/12 (57 y.o. F) Treating RN: Curtis Sites Primary Care Miho Monda: Baruch Gouty Other Clinician: Referring Aquilla Shambley: Baruch Gouty Treating Tiah Heckel/Extender: Baltazar Najjar  G Weeks in Treatment: 4 Wound Status Wound Number: 1 Primary Etiology: Abscess Wound Location: Left Groin Wound Status: Open Wounding Event: Gradually Appeared Comorbid Hypertension, Type II Diabetes, History: Osteoarthritis Date Acquired: 03/23/2018 Weeks  Of Treatment: 4 Clustered Wound: No Photos Photo Uploaded By: Curtis Sites on 04/27/2018 16:11:32 Wound Measurements Length: (cm) 0.6 % Redu Width: (cm) 1.1 % Redu Depth: (cm) 0.2 Epithe Area: (cm) 0.518 Tunne Volume: (cm) 0.104 Po Max ction in Area: 91.1% ction in Volume: 98% lialization: None ling: Yes sition (o'clock): 8 imum Distance: (cm) 0.4 Undermining: No Wound Description Full Thickness Without Exposed Support Foul Classification: Structures Sloug Wound Margin: Distinct, outline attached Exudate Large Amount: Exudate Type: Serosanguineous Exudate Color: red, brown Odor After Cleansing: No h/Fibrino Yes Wound Bed Granulation Amount: Large (67-100%) Exposed Structure Granulation Quality: Red Fascia Exposed: No Necrotic Amount: Small (1-33%) Fat Layer (Subcutaneous Tissue) Exposed: Yes Necrotic Quality: Eschar, Adherent Slough Tendon Exposed: No Debra [name], Debra A. ([id number]) Muscle Exposed: No Joint Exposed: No Bone Exposed: No Periwound Skin Texture Texture Color No Abnormalities Noted: No No Abnormalities Noted: No Callus: No Atrophie Blanche: No Crepitus: No Cyanosis: No Excoriation: No Ecchymosis: Yes Induration: No Erythema: No Rash: No Hemosiderin Staining: No Scarring: No Mottled: No Pallor: No Moisture Rubor: No No Abnormalities Noted: No Dry / Scaly: No Temperature / Pain Maceration: Yes Temperature: Hot Tenderness on Palpation: Yes Wound Preparation Ulcer Cleansing: Rinsed/Irrigated with Saline Topical Anesthetic Applied: Other: lidocaine 4%, Treatment Notes Wound #1 (Left Groin) 1. Cleansed with: Clean wound with Normal Saline 2. Anesthetic Topical Lidocaine 4% cream to wound bed prior to debridement 4. Dressing Applied: Other dressing (specify in notes) 5. Secondary Dressing Applied Bordered Foam Dressing Notes silvercel Electronic Signature(s) Signed: 04/27/2018 4:55:05 PM By: Curtis Sites Entered By: Curtis Sites on 04/27/2018 13:58:00 Debra [name] ([id number]) -------------------------------------------------------------------------------- Vitals Details Patient Name: Debra [name] Date of Service: 04/27/2018 1:45 PM Medical Record Number: [id number] Patient Account Number: [id number] Date of Birth/Sex: 1961/09/06 (57 y.o. F) Treating RN: Curtis Sites Primary Care Annitta Fifield: Baruch Gouty Other Clinician: Referring Nicklous Aburto: Baruch Gouty Treating Amery Vandenbos/Extender: Altamese [place] in Treatment: 4 Vital Signs Time Taken: 13:59 Temperature (F): 98.1 Height (in): 65 Pulse (bpm): 72 Weight (lbs): 230 Respiratory Rate (breaths/min): 16 Body Mass Index (BMI): 38.3 Blood Pressure (mmHg): 117/64 Reference Range: 80 - 120 mg / dl Electronic Signature(s) Signed: 04/27/2018 4:55:05 PM By: Curtis Sites Entered By: Curtis Sites on 04/27/2018 14:00:48

## 2018-04-29 NOTE — Progress Notes (Signed)
KENITRA, LEVENTHAL ([id number]) Visit Report for 04/27/2018 HPI Details Patient Name: Debra [name], Debra [name]. Date of Service: 04/27/2018 1:45 PM Medical Record Number: [id number] Patient Account Number: [id number] Date of Birth/Sex: 09-Dec-1960 (57 y.o. 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 She tells me everything was fine up until a week ago. She noticed pain in her left groin area. By Saturday this it started to drain. She applied black salve to this area which she has done in the past and is helped heal problems in this area. This did not work. She was seen in the ER on 03/27/18 with an abscess in the left groin. Noted to have surrounding cellulitis and an indurated area. She underwent an IandD. She was started on patient clindamycin. Her white count was 11.9. Comprehensive metabolic panel normal lactic acid level normal at 1.2. They did not do a culture that I can see. She was seen yesterday at her primary physician's office she may have been put on cephalexin at that point. She is using iodoform packing The patient is a type II diabetic with a history of PAD last hemoglobin A1c of 7.3. She has a history of parkinsonism, COPD, bipolar disorder hypertension. She is a continued 1 pack per day smoker She tells me that she does have a history of recurrent abscesses in this area last about a year ago. Mostly she seems to care for these herself and they seem to go away. 04/06/18; patient's surgical IandD on the lower left abdominal quadrant just above the symphysis pubis appears better. Following is better we've been using silver alginate. Culture I did last week did not grow MRSA rather Enterococcus faecalis.  I substituted Augmentin for the clindamycin however the patient didn't have enough money, however she will pick it up this morning 04/13/18; surgical IandD on the lower left abdominal quadrant just above the symphysis pubis. Surface of this area looks healthy. This is an oval-shaped wound lying horizontally. Medially it has A, all with about 0.9 cm in depth there is no drainage no surrounding erythema. She is completing the Augmentin I gave her last week 04/27/18; surgical IandD site on the lower left abdominal quadrant just above the symphysis pubis. Surface of this wound is improved dimensions are better. We've been using silver alginate and border foam. She tells me she developed C. difficile has been treated with oral vancomycin. Will need to be very judicious with any further antibiotics Electronic Signature(s) Signed: 04/27/2018 5:10:30 PM By: Baltazar Najjar MD Entered By: Baltazar Najjar on 04/27/2018 15:37:20 [name] ([id number]) -------------------------------------------------------------------------------- Physical Exam Details Patient Name: [name] Date of Service: 04/27/2018 1:45 PM Medical Record Number: [id number] Patient Account Number: [id number] Date of Birth/Sex: [birth year] (57 y.o. F) Treating RN: Huel Coventry Primary Care Provider: Baruch Gouty Other Clinician: Referring Provider: Baruch Gouty Treating Provider/Extender: Altamese [place] in Treatment: 4 Constitutional Sitting or standing Blood Pressure is within target range for patient.. Pulse regular and within target range for patient.[name] Respirations regular, non-labored and within target range.. Temperature is normal and within the target range for the patient.[name] appears in no distress. Respiratory . Bilateral breath sounds are clear and equal in all lobes with no wheezes, rales or rhonchi.. Notes when exam; hearing questions  on the left lower abdominal quadrant just superior to her symphysis pubis.  There is lymphedema in the pannus in the area. There is no surrounding erythema no evidence of infection currently. The small tunnel at 9:00 is come down to 0.1 her 0.2 cm from 0.9 cm last time this is a major improvement Electronic Signature(s) Signed: 04/27/2018 5:10:30 PM By: Baltazar Najjar MD Entered By: Baltazar Najjar on 04/27/2018 15:38:55 [name] ([id number]) -------------------------------------------------------------------------------- Physician Orders Details Patient Name: [name]. Date of Service: 04/27/2018 1:45 PM Medical Record Number: [id number] Patient Account Number: [id number] Date of Birth/Sex: 04/07/1961 (57 y.o. F) Treating RN: Huel Coventry Primary Care Provider: Baruch Gouty Other Clinician: Referring Provider: Baruch Gouty Treating Provider/Extender: Altamese [place] in Treatment: 4 Verbal / Phone Orders: No Diagnosis Coding Wound Cleansing Wound #1 Left Groin o Clean wound with Normal Saline. Anesthetic (add to Medication List) Wound #1 Left Groin o Topical Lidocaine 4% cream applied to wound bed prior to debridement (In Clinic Only). Primary Wound Dressing Wound #1 Left Groin o Silver Alginate - pack lightly into tunnel at 7:00 Secondary Dressing Wound #1 Left Groin o Boardered Foam Dressing Dressing Change Frequency Wound #1 Left Groin o Change dressing every day. Follow-up Appointments Wound #1 Left Groin o Return Appointment in 1 week. Additional Orders / Instructions Wound #1 Left Groin o Stop Smoking Medications-please add to medication list. Wound #1 Left Groin o P.O. Antibiotics - continue abx Electronic Signature(s) Signed: 04/27/2018 5:10:30 PM By: Baltazar Najjar MD Signed: 04/27/2018 5:19:09 PM By: Elliot Gurney, BSN, RN, CWS, Thomasena RN, BSN Entered By: Elliot Gurney, BSN, RN, CWS, Jniya on 04/27/2018 14:51:06 [name] ([id number]) -------------------------------------------------------------------------------- Problem  List Details Patient Name: [name] Date of Service: 04/27/2018 1:45 PM Medical Record Number: [id number] Patient Account Number: [id number] Date of Birth/Sex: 08-Jun-1961 (57 y.o. F) Treating RN: Huel Coventry Primary Care Provider: Baruch Gouty Other Clinician: Referring Provider: Baruch Gouty Treating Provider/Extender: Altamese [place] in Treatment: 4 Active Problems ICD-10 Impacting Encounter Code Description Active Date Wound Healing Diagnosis L02.211 Cutaneous abscess of abdominal wall 03/30/2018 Yes T81.31XD Disruption of external operation (surgical) wound, not 03/30/2018 Yes elsewhere classified, subsequent encounter Inactive Problems Resolved Problems Electronic Signature(s) Signed: 04/27/2018 5:10:30 PM By: Baltazar Najjar MD Entered By: Baltazar Najjar on 04/27/2018 15:35:46 [name] ([id number]) -------------------------------------------------------------------------------- Progress Note Details Patient Name: [name] Date of Service: 04/27/2018 1:45 PM Medical Record Number: [id number] Patient Account Number: [id number] Date of Birth/Sex: [birth year] (57 y.o. 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 She tells me everything was fine up until a week ago. She noticed pain in her left groin area. By Saturday this it started to drain. She applied black salve to this area which she has done in the past and is helped heal problems in this area. This did not work. She was seen in the ER on 03/27/18 with an abscess in the left groin. Noted to have surrounding cellulitis and an indurated area. She underwent an IandD. She was started on patient clindamycin. Her white  count was 11.9. Comprehensive metabolic panel normal lactic acid level normal at 1.2. They did not do a culture that I can see. She was seen yesterday at her primary physician's  office she may have been put on cephalexin at that point. She is using iodoform packing The patient is a type II diabetic with a history of PAD last hemoglobin A1c of 7.3. She has a history of parkinsonism, COPD, bipolar disorder hypertension. She is a continued 1 pack per day smoker She tells me that she does have a history of recurrent abscesses in this area last about a year ago. Mostly she seems to care for these herself and they seem to go away. 04/06/18; patient's surgical IandD on the lower left abdominal quadrant just above the symphysis pubis appears better. Following is better we've been using silver alginate. Culture I did last week did not grow MRSA rather Enterococcus faecalis. I substituted Augmentin for the clindamycin however the patient didn't have enough money, however she will pick it up this morning 04/13/18; surgical IandD on the lower left abdominal quadrant just above the symphysis pubis. Surface of this area looks healthy. This is an oval-shaped wound lying horizontally. Medially it has A, all with about 0.9 cm in depth there is no drainage no surrounding erythema. She is completing the Augmentin I gave her last week 04/27/18; surgical IandD site on the lower left abdominal quadrant just above the symphysis pubis. Surface of this wound is improved dimensions are better. We've been using silver alginate and border foam. She tells me she developed C. difficile has been treated with oral vancomycin. Will need to be very judicious with any further antibiotics Objective Constitutional Sitting or standing Blood Pressure is within target range for patient.. Pulse regular and within target range for patient.[name] Respirations regular, non-labored and within target range.. Temperature is normal and within the target  range for the patient.[name] appears in no distress. Vitals Time Taken: 1:59 PM, Height: 65 in, Weight: 230 lbs, BMI: 38.3, Temperature: 98.1 F, Pulse: 72 bpm, Respiratory Rate: 16 breaths/min, Blood Pressure: 117/64 mmHg. Respiratory Bilateral breath sounds are clear and equal in all lobes with no wheezes, rales or rhonchi.[name] [name] ([id number]) General Notes: when exam; hearing questions on the left lower abdominal quadrant just superior to her symphysis pubis. There is lymphedema in the pannus in the area. There is no surrounding erythema no evidence of infection currently. The small tunnel at 9:00 is come down to 0.1 her 0.2 cm from 0.9 cm last time this is a major improvement Integumentary (Hair, Skin) Wound #1 status is Open. Original cause of wound was Gradually Appeared. The wound is located on the Left Groin. The wound measures 0.6cm length x 1.1cm width x 0.2cm depth; 0.518cm^2 area and 0.104cm^3 volume. There is Fat Layer (Subcutaneous Tissue) Exposed exposed. There is no undermining noted, however, there is tunneling at 8:00 with a maximum distance of 0.4cm. There is a large amount of serosanguineous drainage noted. The wound margin is distinct with the outline attached to the wound base. There is large (67-100%) red granulation within the wound bed. There is a small (1-33%) amount of necrotic tissue within the wound bed including Eschar and Adherent Slough. The periwound skin appearance exhibited: Maceration, Ecchymosis. The periwound skin appearance did not exhibit: Callus, Crepitus, Excoriation, Induration, Rash, Scarring, Dry/Scaly, Atrophie Blanche, Cyanosis, Hemosiderin Staining, Mottled, Pallor, Rubor, Erythema. Periwound temperature was noted as Hot. The periwound has tenderness on palpation. Assessment Active Problems ICD-10 L02.211 - Cutaneous abscess of abdominal wall T81.31XD - Disruption of external operation (surgical) wound, not elsewhere classified, subsequent  encounter Plan Wound Cleansing: Wound #1 Left Groin: Clean wound with Normal Saline. Anesthetic (  add to Medication List): Wound #1 Left Groin: Topical Lidocaine 4% cream applied to wound bed prior to debridement (In Clinic Only). Primary Wound Dressing: Wound #1 Left Groin: Silver Alginate - pack lightly into tunnel at 7:00 Secondary Dressing: Wound #1 Left Groin: Boardered Foam Dressing Dressing Change Frequency: Wound #1 Left Groin: Change dressing every day. Follow-up Appointments: Wound #1 Left Groin: Return Appointment in 1 week. Additional Orders / Instructions: Wound #1 Left Groin: Stop Smoking Medications-please add to medication list.: Wound #1 Left Groin: P.O. Antibiotics - continue abx RYONNA, CIMINI. ([id number]) #1 the patient is doing well he with the current dressings which is sober alginate and border foam. I see no reason to change the dressing. #2 developed Pseudomonas colitis related to antibiotics we gave her. Antibiotics were necessary however will need to be judicious about this in the future. She apparently is doing well with oral vancomycin Electronic Signature(s) Signed: 04/27/2018 5:10:30 PM By: Baltazar Najjar MD Entered By: Baltazar Najjar on 04/27/2018 15:40:26 [name] ([id number]) -------------------------------------------------------------------------------- SuperBill Details Patient Name: [name] Date of Service: 04/27/2018 Medical Record Number: [id number] Patient Account Number: [id number] Date of Birth/Sex: [birth year] (57 y.o. F) Treating RN: Huel Coventry Primary Care Provider: Baruch Gouty Other Clinician: Referring Provider: Baruch Gouty Treating Provider/Extender: Altamese [place] in Treatment: 4 Diagnosis Coding ICD-10 Codes Code Description L02.211 Cutaneous abscess of abdominal wall T81.31XD Disruption of external operation (surgical) wound, not elsewhere classified, subsequent encounter Facility Procedures CPT4  Code: 08657846 Description: [phone] - WOUND CARE VISIT-LEV 2 EST PT Modifier: Quantity: 1 Physician Procedures CPT4: Description Modifier Quantity Code [id number] - WC PHYS LEVEL 2 - EST PT 1 ICD-10 Diagnosis Description L02.211 Cutaneous abscess of abdominal wall T81.31XD Disruption of external operation (surgical) wound, not elsewhere classified, subsequent  encounter Electronic Signature(s) Signed: 04/27/2018 5:10:30 PM By: Baltazar Najjar MD Entered By: Baltazar Najjar on 04/27/2018 15:40:47

## 2018-05-03 NOTE — Assessment & Plan Note (Signed)
Encouraged weight loss; see AVS 

## 2018-05-04 ENCOUNTER — Encounter: Payer: Managed Care, Other (non HMO) | Admitting: Internal Medicine

## 2018-05-04 DIAGNOSIS — T8131XA Disruption of external operation (surgical) wound, not elsewhere classified, initial encounter: Secondary | ICD-10-CM | POA: Diagnosis not present

## 2018-05-04 NOTE — Progress Notes (Signed)
[name], [name] ([id number]) Visit Report for 04/13/2018 Arrival Information Details Patient Name: Debra [name], Debra [name]. Date of Service: 04/13/2018 1:15 PM Medical Record Number: [id number] Patient Account Number: [id number] Date of Birth/Sex: 01-16-1961 (57 y.o. F) Treating RN: Huel Coventry Primary Care Nakesha Ebrahim: Baruch Gouty Other Clinician: Referring Curly Mackowski: Baruch Gouty Treating Jeri Jeanbaptiste/Extender: Altamese [place] in Treatment: 2 Visit Information History Since Last Visit Added or deleted any medications: No Patient Arrived: Ambulatory Any new allergies or adverse reactions: No Arrival Time: 13:15 Had a fall or experienced change in No Accompanied By: friend activities of daily living that may affect Transfer Assistance: None risk of falls: Patient Identification Verified: Yes Signs or symptoms of abuse/neglect since last visito No Patient Requires Transmission-Based No Hospitalized since last visit: No Precautions: Implantable device outside of the clinic excluding No Patient Has Alerts: Yes cellular tissue based products placed in the center Patient Alerts: DM II since last visit: Pain Present Now: No Electronic Signature(s) Signed: 05/04/2018 7:45:54 AM By: Arnette Norris Entered By: Arnette Norris on 04/13/2018 13:16:37 Debra [name] ([id number]) -------------------------------------------------------------------------------- Clinic Level of Care Assessment Details Patient Name: Debra [name] Date of Service: 04/13/2018 1:15 PM Medical Record Number: [id number] Patient Account Number: [id number] Date of Birth/Sex: 07-25-1961 (57 y.o. F) Treating RN: Huel Coventry Primary Care Jillayne Witte: Baruch Gouty Other Clinician: Referring Teria Khachatryan: Baruch Gouty Treating Ambur [place]/Extender: Altamese [place] in Treatment: 2 Clinic Level of Care Assessment Items TOOL 4 Quantity Score  - Use when only an EandM is performed on FOLLOW-UP visit 0 ASSESSMENTS - Nursing Assessment /  Reassessment  - Reassessment of Co-morbidities (includes updates in patient status) 0 X- 1 5 Reassessment of Adherence to Treatment Plan ASSESSMENTS - Wound and Skin Assessment / Reassessment X - Simple Wound Assessment / Reassessment - one wound 1 5  - 0 Complex Wound Assessment / Reassessment - multiple wounds  - 0 Dermatologic / Skin Assessment (not related to wound area) ASSESSMENTS - Focused Assessment  - Circumferential Edema Measurements - multi extremities 0  - 0 Nutritional Assessment / Counseling / Intervention  - 0 Lower Extremity Assessment (monofilament, tuning fork, pulses)  - 0 Peripheral Arterial Disease Assessment (using hand held doppler) ASSESSMENTS - Ostomy and/or Continence Assessment and Care  - Incontinence Assessment and Management 0  - 0 Ostomy Care Assessment and Management (repouching, etc.) PROCESS - Coordination of Care X - Simple Patient / Family Education for ongoing care 1 15  - 0 Complex (extensive) Patient / Family Education for ongoing care  - 0 Staff obtains Chiropractor, Records, Test Results / Process Orders  - 0 Staff telephones HHA, Nursing Homes / Clarify orders / etc  - 0 Routine Transfer to another Facility (non-emergent condition)  - 0 Routine Hospital Admission (non-emergent condition)  - 0 New Admissions / Manufacturing engineer / Ordering NPWT, Apligraf, etc.  - 0 Emergency Hospital Admission (emergent condition) X- 1 10 Simple Discharge Coordination GAYLA, BENN A. ([id number])  - 0 Complex (extensive) Discharge Coordination PROCESS - Special Needs  - Pediatric / Minor Patient Management 0  - 0 Isolation Patient Management  - 0 Hearing / Language / Visual special needs  - 0 Assessment of Community assistance (transportation, D/C planning, etc.)  - 0 Additional assistance / Altered mentation  - 0 Support Surface(s) Assessment (bed, cushion, seat, etc.) INTERVENTIONS - Wound  Cleansing / Measurement X - Simple Wound Cleansing - one wound 1 5  - 0 Complex Wound Cleansing - multiple wounds X- 1 5 Wound Imaging (  photographs - any number of wounds)  - 0 Wound Tracing (instead of photographs) X- 1 5 Simple Wound Measurement - one wound  - 0 Complex Wound Measurement - multiple wounds INTERVENTIONS - Wound Dressings  - Small Wound Dressing one or multiple wounds 0 X- 1 15 Medium Wound Dressing one or multiple wounds  - 0 Large Wound Dressing one or multiple wounds  - 0 Application of Medications - topical  - 0 Application of Medications - injection INTERVENTIONS - Miscellaneous  - External ear exam 0  - 0 Specimen Collection (cultures, biopsies, blood, body fluids, etc.)  - 0 Specimen(s) / Culture(s) sent or taken to Lab for analysis  - 0 Patient Transfer (multiple staff / Nurse, adult / Similar devices)  - 0 Simple Staple / Suture removal (25 or less)  - 0 Complex Staple / Suture removal (26 or more)  - 0 Hypo / Hyperglycemic Management (close monitor of Blood Glucose)  - 0 Ankle / Brachial Index (ABI) - do not check if billed separately X- 1 5 Vital Signs Phillis, Jacolyn A. ([id number]) Has the patient been seen at the hospital within the last three years: Yes Total Score: 70 Level Of Care: New/Established - Level 2 Electronic Signature(s) Signed: 04/13/2018 5:47:31 PM By: Elliot Gurney, BSN, RN, CWS, Josslin RN, BSN Entered By: Elliot Gurney, BSN, RN, CWS, Dajahnae on 04/13/2018 13:44:16 Debra [name] ([id number]) -------------------------------------------------------------------------------- Encounter Discharge Information Details Patient Name: Debra [name]. Date of Service: 04/13/2018 1:15 PM Medical Record Number: [id number] Patient Account Number: [id number] Date of Birth/Sex: 1961-08-21 (57 y.o. F) Treating RN: Curtis Sites Primary Care Marshella Tello: Baruch Gouty Other Clinician: Referring Francesca Strome: Baruch Gouty Treating Peony Barner/Extender:  Altamese [place] in Treatment: 2 Encounter Discharge Information Items Discharge Condition: Stable Ambulatory Status: Ambulatory Discharge Destination: Home Transportation: Private Auto Accompanied By: friend Schedule Follow-up Appointment: Yes Clinical Summary of Care: Electronic Signature(s) Signed: 04/14/2018 4:34:09 PM By: Curtis Sites Entered By: Curtis Sites on 04/13/2018 14:22:10 Debra [name] ([id number]) -------------------------------------------------------------------------------- Lower Extremity Assessment Details Patient Name: Debra [name] Date of Service: 04/13/2018 1:15 PM Medical Record Number: [id number] Patient Account Number: [id number] Date of Birth/Sex: [birth year] (57 y.o. F) Treating RN: Huel Coventry Primary Care Cleophus Mendonsa: Baruch Gouty Other Clinician: Referring Rhet Rorke: Baruch Gouty Treating Deston Bilyeu/Extender: Altamese [place] in Treatment: 2 Electronic Signature(s) Signed: 04/13/2018 5:47:31 PM By: Elliot Gurney, BSN, RN, CWS, Sativa RN, BSN Signed: 05/04/2018 7:45:54 AM By: Arnette Norris Entered By: Arnette Norris on 04/13/2018 13:29:15 Debra [name] ([id number]) -------------------------------------------------------------------------------- Multi Wound Chart Details Patient Name: Debra [name] Date of Service: 04/13/2018 1:15 PM Medical Record Number: [id number] Patient Account Number: [id number] Date of Birth/Sex: 02-14-1961 (57 y.o. F) Treating RN: Huel Coventry Primary Care Celsey Asselin: Baruch Gouty Other Clinician: Referring Geoge Lawrance: Baruch Gouty Treating Mable Lashley/Extender: Altamese [place] in Treatment: 2 Vital Signs Height(in): 65 Pulse(bpm): 77 Weight(lbs): 230 Blood Pressure(mmHg): 127/66 Body Mass Index(BMI): 38 Temperature(F): 97.8 Respiratory Rate 18 (breaths/min): Photos: [1:No Photos] [N/A:N/A] Wound Location: [1:Left Groin] [N/A:N/A] Wounding Event: [1:Gradually Appeared] [N/A:N/A] Primary Etiology: [1:Abscess]  [N/A:N/A] Comorbid History: [1:Hypertension, Type II Diabetes, Osteoarthritis] [N/A:N/A] Date Acquired: [1:03/23/2018] [N/A:N/A] Weeks of Treatment: [1:2] [N/A:N/A] Wound Status: [1:Open] [N/A:N/A] Measurements L x W x D [1:1.7x3.3x0.4] [N/A:N/A] (cm) Area (cm) : [1:4.406] [N/A:N/A] Volume (cm) : [1:1.762] [N/A:N/A] % Reduction in Area: [1:24.20%] [N/A:N/A] % Reduction in Volume: [1:66.30%] [N/A:N/A] Position 1 (o'clock): [1:9] Maximum Distance 1 (cm): [1:0.9] Tunneling: [1:Yes] [N/A:N/A] Classification: [1:Full Thickness Without Exposed Support Structures] [N/A:N/A] Exudate Amount: [1:Large] [N/A:N/A] Exudate  Type: [1:Serosanguineous] [N/A:N/A] Exudate Color: [1:red, brown] [N/A:N/A] Wound Margin: [1:Distinct, outline attached] [N/A:N/A] Granulation Amount: [1:Medium (34-66%)] [N/A:N/A] Granulation Quality: [1:Red] [N/A:N/A] Necrotic Amount: [1:Medium (34-66%)] [N/A:N/A] Necrotic Tissue: [1:Eschar, Adherent Slough] [N/A:N/A] Exposed Structures: [1:Fat Layer (Subcutaneous Tissue) Exposed: Yes Fascia: No Tendon: No Muscle: No Joint: No Bone: No] [N/A:N/A] Epithelialization: [1:None] [N/A:N/A] Periwound Skin Texture: [N/A:N/A] Excoriation: No Induration: No Callus: No Crepitus: No Rash: No Scarring: No Periwound Skin Moisture: Maceration: No N/A N/A Dry/Scaly: No Periwound Skin Color: Erythema: Yes N/A N/A Atrophie Blanche: No Cyanosis: No Ecchymosis: No Hemosiderin Staining: No Mottled: No Pallor: No Rubor: No Erythema Location: Circumferential N/A N/A Temperature: Hot N/A N/A Tenderness on Palpation: Yes N/A N/A Wound Preparation: Ulcer Cleansing: N/A N/A Rinsed/Irrigated with Saline Topical Anesthetic Applied: Other: lidocaine 4% Treatment Notes Electronic Signature(s) Signed: 04/14/2018 1:16:01 PM By: Baltazar Najjar MD Entered By: Baltazar Najjar on 04/13/2018 13:45:12 Debra [name]  ([id number]) -------------------------------------------------------------------------------- Multi-Disciplinary Care Plan Details Patient Name: Debra [name]. Date of Service: 04/13/2018 1:15 PM Medical Record Number: [id number] Patient Account Number: [id number] Date of Birth/Sex: 1961/07/06 (57 y.o. F) Treating RN: Huel Coventry Primary Care Sheccid Lahmann: Baruch Gouty Other Clinician: Referring Denya Buckingham: Baruch Gouty Treating Kelby Lotspeich/Extender: Altamese [place] in Treatment: 2 Active Inactive ` Orientation to the Wound Care Program Nursing Diagnoses: Knowledge deficit related to the wound healing center program Goals: Patient/caregiver will verbalize understanding of the Wound Healing Center Program Date Initiated: 04/06/2018 Target Resolution Date: 07/16/2018 Goal Status: Active Interventions: Provide education on orientation to the wound center Notes: ` Wound/Skin Impairment Nursing Diagnoses: Impaired tissue integrity Goals: Ulcer/skin breakdown will heal within 14 weeks Date Initiated: 04/06/2018 Target Resolution Date: 07/16/2018 Goal Status: Active Interventions: Assess patient/caregiver ability to obtain necessary supplies Assess patient/caregiver ability to perform ulcer/skin care regimen upon admission and as needed Assess ulceration(s) every visit Notes: Electronic Signature(s) Signed: 04/13/2018 5:47:31 PM By: Elliot Gurney, BSN, RN, CWS, Nehemie RN, BSN Entered By: Elliot Gurney, BSN, RN, CWS, [place] on 04/13/2018 13:40:20 Debra [name] ([id number]) -------------------------------------------------------------------------------- Pain Assessment Details Patient Name: Debra [name]. Date of Service: 04/13/2018 1:15 PM Medical Record Number: [id number] Patient Account Number: [id number] Date of Birth/Sex: 22-Aug-1961 (57 y.o. F) Treating RN: Huel Coventry Primary Care Kaleen Rochette: Baruch Gouty Other Clinician: Referring Kanya Potteiger: Baruch Gouty Treating Delyla Sandeen/Extender: Altamese [place] in  Treatment: 2 Active Problems Location of Pain Severity and Description of Pain Patient Has Paino No Site Locations Pain Management and Medication Current Pain Management: Electronic Signature(s) Signed: 04/13/2018 5:47:31 PM By: Elliot Gurney, BSN, RN, CWS, Sarahmarie RN, BSN Signed: 05/04/2018 7:45:54 AM By: Arnette Norris Entered By: Arnette Norris on 04/13/2018 13:18:16 Debra [name] ([id number]) -------------------------------------------------------------------------------- Patient/Caregiver Education Details Patient Name: Debra [name]. Date of Service: 04/13/2018 1:15 PM Medical Record Number: [id number] Patient Account Number: [id number] Date of Birth/Gender: [birth year] (57 y.o. F) Treating RN: Curtis Sites Primary Care Physician: Baruch Gouty Other Clinician: Referring Physician: Baruch Gouty Treating Physician/Extender: Altamese [place] in Treatment: 2 Education Assessment Education Provided To: Patient and Caregiver friend Education Topics Provided Wound/Skin Impairment: Handouts: Other: wound care as ordered Methods: Explain/Verbal Responses: State content correctly Electronic Signature(s) Signed: 04/14/2018 4:34:09 PM By: Curtis Sites Entered By: Curtis Sites on 04/13/2018 14:22:40 Debra [name] ([id number]) -------------------------------------------------------------------------------- Wound Assessment Details Patient Name: Debra [name] Date of Service: 04/13/2018 1:15 PM Medical Record Number: [id number] Patient Account Number: [id number] Date of Birth/Sex: [birth year] (57 y.o. F) Treating RN: Huel Coventry Primary Care Yesennia Hirota: Baruch Gouty Other Clinician: Referring Natonya Finstad: Baruch Gouty Treating  Jakai Risse/Extender: Maxwell Caul Weeks in Treatment: 2 Wound Status Wound Number: 1 Primary Etiology: Abscess Wound Location: Left Groin Wound Status: Open Wounding Event: Gradually Appeared Comorbid Hypertension, Type II Diabetes, History: Osteoarthritis Date  Acquired: 03/23/2018 Weeks Of Treatment: 2 Clustered Wound: No Photos Photo Uploaded By: Arnette Norris on 04/13/2018 16:30:40 Wound Measurements Length: (cm) 1.7 Width: (cm) 3.3 Depth: (cm) 0.4 Area: (cm) 4.406 Volume: (cm) 1.762 % Reduction in Area: 24.2% % Reduction in Volume: 66.3% Epithelialization: None Tunneling: Yes Position (o'clock): 9 Maximum Distance: (cm) 0.9 Undermining: No Wound Description Full Thickness Without Exposed Support Fo Classification: Structures Sl Wound Margin: Distinct, outline attached Exudate Large Amount: Exudate Type: Serosanguineous Exudate Color: red, brown ul Odor After Cleansing: No ough/Fibrino Yes Wound Bed Granulation Amount: Medium (34-66%) Exposed Structure Granulation Quality: Red Fascia Exposed: No Necrotic Amount: Medium (34-66%) Fat Layer (Subcutaneous Tissue) Exposed: Yes Necrotic Quality: Eschar, Adherent Slough Tendon Exposed: No EMIKO, OSORTO A. ([id number]) Muscle Exposed: No Joint Exposed: No Bone Exposed: No Periwound Skin Texture Texture Color No Abnormalities Noted: No No Abnormalities Noted: No Callus: No Atrophie Blanche: No Crepitus: No Cyanosis: No Excoriation: No Ecchymosis: No Induration: No Erythema: Yes Rash: No Erythema Location: Circumferential Scarring: No Hemosiderin Staining: No Mottled: No Moisture Pallor: No No Abnormalities Noted: No Rubor: No Dry / Scaly: No Maceration: No Temperature / Pain Temperature: Hot Tenderness on Palpation: Yes Wound Preparation Ulcer Cleansing: Rinsed/Irrigated with Saline Topical Anesthetic Applied: Other: lidocaine 4%, Electronic Signature(s) Signed: 04/13/2018 5:47:31 PM By: Elliot Gurney, BSN, RN, CWS, Miracle RN, BSN Entered By: Elliot Gurney, BSN, RN, CWS, Raimi on 04/13/2018 13:38:47 Debra [name] ([id number]) -------------------------------------------------------------------------------- Vitals Details Patient Name: Debra [name] Date of Service: 04/13/2018 1:15  PM Medical Record Number: [id number] Patient Account Number: [id number] Date of Birth/Sex: December 27, 1960 (57 y.o. F) Treating RN: Huel Coventry Primary Care Rhyleigh Grassel: Baruch Gouty Other Clinician: Referring Numair Masden: Baruch Gouty Treating Anjela Cassara/Extender: Altamese [place] in Treatment: 2 Vital Signs Time Taken: 13:18 Temperature (F): 97.8 Height (in): 65 Pulse (bpm): 77 Weight (lbs): 230 Respiratory Rate (breaths/min): 18 Body Mass Index (BMI): 38.3 Blood Pressure (mmHg): 127/66 Reference Range: 80 - 120 mg / dl Electronic Signature(s) Signed: 05/04/2018 7:45:54 AM By: Arnette Norris Entered By: Arnette Norris on 04/13/2018 13:18:53

## 2018-05-06 NOTE — Progress Notes (Signed)
HENLI, HEY ([id number]) Visit Report for 05/04/2018 HPI Details Patient Name: Debra [name], Debra [name]. Date of Service: 05/04/2018 2:15 PM Medical Record Number: [id number] Patient Account Number: [id number] Date of Birth/Sex: 1961/08/28 (57 y.o. 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 She tells me everything was fine up until a week ago. She noticed pain in her left groin area. By Saturday this it started to drain. She applied black salve to this area which she has done in the past and is helped heal problems in this area. This did not work. She was seen in the ER on 03/27/18 with an abscess in the left groin. Noted to have surrounding cellulitis and an indurated area. She underwent an IandD. She was started on patient clindamycin. Her white count was 11.9. Comprehensive metabolic panel normal lactic acid level normal at 1.2. They did not do a culture that I can see. She was seen yesterday at her primary physician's office she may have been put on cephalexin at that point. She is using iodoform packing The patient is a type II diabetic with a history of PAD last hemoglobin A1c of 7.3. She has a history of parkinsonism, COPD, bipolar disorder hypertension. She is a continued 1 pack per day smoker She tells me that she does have a history of recurrent abscesses in this area last about a year ago. Mostly she seems to care for these herself and they seem to go away. 04/06/18; patient's surgical IandD on the lower left abdominal quadrant just above the symphysis pubis appears better. Following is better we've been using silver alginate. Culture I did last week did not grow MRSA rather Enterococcus faecalis.  I substituted Augmentin for the clindamycin however the patient didn't have enough money, however she will pick it up this morning 04/13/18; surgical IandD on the lower left abdominal quadrant just above the symphysis pubis. Surface of this area looks healthy. This is an oval-shaped wound lying horizontally. Medially it has A, all with about 0.9 cm in depth there is no drainage no surrounding erythema. She is completing the Augmentin I gave her last week 04/27/18; surgical IandD site on the lower left abdominal quadrant just above the symphysis pubis. Surface of this wound is improved dimensions are better. We've been using silver alginate and border foam. She tells me she developed C. difficile has been treated with oral vancomycin. Will need to be very judicious with any further antibiotics 05/04/18; surgical IandD site on the lower left abdomen just above the symphysis pubis. All of this is closed over except a small slit like area which is probably where the sinus tract was. There is no drainage no tenderness. Electronic Signature(s) Signed: 05/05/2018 8:24:56 AM By: Baltazar Najjar MD Entered By: Baltazar Najjar on 05/04/2018 15:00:37 Debra [name] ([id number]) -------------------------------------------------------------------------------- Physical Exam Details Patient Name: Debra [name] Date of Service: 05/04/2018 2:15 PM Medical Record Number: [id number] Patient Account Number: [id number] Date of Birth/Sex: 1961/03/30 (57 y.o. F) Treating RN: Huel Coventry Primary Care Provider: Baruch Gouty Other Clinician: Referring Provider: Baruch Gouty Treating Provider/Extender: Altamese  in Treatment: 5 Constitutional Sitting or standing Blood Pressure is within target range for patient.. Pulse regular and within target range for patient.[name] Respirations regular, non-labored and within  target range.. Temperature is normal and within the target range for the patient.[name] appears in no  distress. Eyes Conjunctivae clear. No discharge. Respiratory Respiratory effort is easy and symmetric bilaterally. Rate is normal at rest and on room air.. Gastrointestinal (GI) Abdomen is soft and non-distended without masses or tenderness. Bowel sounds active in all quadrants.. Integumentary (Hair, Skin) no primary skin issues are seen. Notes wound exam; left lower abdominal quadrant just above her symphysis pubis. The most distal area here is a very small slit measuring a few millimeters in length. I did not attempt to probe the depths of this therefore I can't really be sure the exact depth. There is no evidence of surrounding infection. Her exam is otherwise benign Electronic Signature(s) Signed: 05/05/2018 8:24:56 AM By: Baltazar Najjar MD Entered By: Baltazar Najjar on 05/04/2018 15:15:32 Debra [name] ([id number]) -------------------------------------------------------------------------------- Physician Orders Details Patient Name: Debra [name] Date of Service: 05/04/2018 2:15 PM Medical Record Number: [id number] Patient Account Number: [id number] Date of Birth/Sex: 1961/04/22 (57 y.o. F) Treating RN: Huel Coventry Primary Care Provider: Baruch Gouty Other Clinician: Referring Provider: Baruch Gouty Treating Provider/Extender: Altamese [place] in Treatment: 5 Verbal / Phone Orders: No Diagnosis Coding ICD-10 Coding Code Description L02.211 Cutaneous abscess of abdominal wall T81.31XD Disruption of external operation (surgical) wound, not elsewhere classified, subsequent encounter Primary Wound Dressing Wound #1 Left Groin o Iodoflex Secondary Dressing Wound #1 Left Groin o Boardered Foam Dressing Dressing Change Frequency Wound #1 Left Groin o Three times weekly Follow-up Appointments Wound #1 Left Groin o Return Appointment in 1 week. Electronic Signature(s) Signed: 05/04/2018 5:15:45 PM By: Elliot Gurney, BSN, RN, CWS, Christna RN, BSN Signed: 05/05/2018 8:24:56 AM  By: Baltazar Najjar MD Entered By: Elliot Gurney, BSN, RN, CWS, Irja on 05/04/2018 17:15:44 Debra [name] ([id number]) -------------------------------------------------------------------------------- Problem List Details Patient Name: Debra [name], Debra [name]. Date of Service: 05/04/2018 2:15 PM Medical Record Number: [id number] Patient Account Number: [id number] Date of Birth/Sex: 07/19/1961 (57 y.o. F) Treating RN: Huel Coventry Primary Care Provider: Baruch Gouty Other Clinician: Referring Provider: Baruch Gouty Treating Provider/Extender: Altamese [place] in Treatment: 5 Active Problems ICD-10 Impacting Encounter Code Description Active Date Wound Healing Diagnosis L02.211 Cutaneous abscess of abdominal wall 03/30/2018 Yes T81.31XD Disruption of external operation (surgical) wound, not 03/30/2018 Yes elsewhere classified, subsequent encounter Inactive Problems Resolved Problems Electronic Signature(s) Signed: 05/05/2018 8:24:56 AM By: Baltazar Najjar MD Entered By: Baltazar Najjar on 05/04/2018 14:59:15 Debra [name] ([id number]) -------------------------------------------------------------------------------- Progress Note Details Patient Name: Debra [name] Date of Service: 05/04/2018 2:15 PM Medical Record Number: [id number] Patient Account Number: [id number] Date of Birth/Sex: 02-17-1961 (57 y.o. 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 She tells me everything was fine up until a week ago. She noticed pain in her left groin area. By Saturday this it started to drain. She applied black salve to this area which she has done in the past and is helped heal problems in this area. This did not work. She  was seen in the ER on 03/27/18 with an abscess in the left groin. Noted to have surrounding cellulitis and an indurated area. She underwent an IandD. She was started on patient clindamycin. Her white count was 11.9. Comprehensive metabolic panel normal lactic acid level  normal at 1.2. They did not do a culture that I can see. She was seen yesterday at her primary physician's office she may have been put on cephalexin at that point. She is using iodoform packing The patient is a type II diabetic with a history of PAD last hemoglobin A1c of 7.3. She has a history of parkinsonism, COPD, bipolar disorder hypertension. She is a continued 1 pack per day smoker She tells me that she does have a history of recurrent abscesses in this area last about a year ago. Mostly she seems to care for these herself and they seem to go away. 04/06/18; patient's surgical IandD on the lower left abdominal quadrant just above the symphysis pubis appears better. Following is better we've been using silver alginate. Culture I did last week did not grow MRSA rather Enterococcus faecalis. I substituted Augmentin for the clindamycin however the patient didn't have enough money, however she will pick it up this morning 04/13/18; surgical IandD on the lower left abdominal quadrant just above the symphysis pubis. Surface of this area looks healthy. This is an oval-shaped wound lying horizontally. Medially it has A, all with about 0.9 cm in depth there is no drainage no surrounding erythema. She is completing the Augmentin I gave her last week 04/27/18; surgical IandD site on the lower left abdominal quadrant just above the symphysis pubis. Surface of this wound is improved dimensions are better. We've been using silver alginate and border foam. She tells me she developed C. difficile has been treated with oral vancomycin. Will need to be very judicious with any further antibiotics 05/04/18; surgical IandD site on the lower left abdomen  just above the symphysis pubis. All of this is closed over except a small slit like area which is probably where the sinus tract was. There is no drainage no tenderness. Objective Constitutional Sitting or standing Blood Pressure is within target range for patient.. Pulse regular and within target range for patient.[name] Respirations regular, non-labored and within target range.. Temperature is normal and within the target range for the patient.[name] appears in no distress. Vitals Time Taken: 2:24 PM, Height: 65 in, Weight: 230 lbs, BMI: 38.3, Temperature: 97.7 F, Pulse: 67 bpm, Respiratory Rate: 18 breaths/min, Blood Pressure: 141/59 mmHg. Eyes ELVIN, BANKER ([id number]) Conjunctivae clear. No discharge. Respiratory Respiratory effort is easy and symmetric bilaterally. Rate is normal at rest and on room air.. Gastrointestinal (GI) Abdomen is soft and non-distended without masses or tenderness. Bowel sounds active in all quadrants.. General Notes: wound exam; left lower abdominal quadrant just above her symphysis pubis. The most distal area here is a very small slit measuring a few millimeters in length. I did not attempt to probe the depths of this therefore I can't really be sure the exact depth. There is no evidence of surrounding infection. Her exam is otherwise benign Integumentary (Hair, Skin) no primary skin issues are seen. Wound #1 status is Open. Original cause of wound was Gradually Appeared. The wound is located on the Left Groin. The wound measures 0.1cm length x 0.1cm width x 0.1cm depth; 0.008cm^2 area and 0.001cm^3 volume. There is Fat Layer (Subcutaneous Tissue) Exposed exposed. There is no tunneling or undermining noted. There is a none present amount of drainage noted. The wound margin is distinct with the outline attached to the wound base. There is no granulation within the wound bed. There is a large (67-100%) amount of necrotic tissue within the wound bed including Eschar. The  periwound skin appearance exhibited:  Maceration, Ecchymosis. The periwound skin appearance did not exhibit: Callus, Crepitus, Excoriation, Induration, Rash, Scarring, Dry/Scaly, Atrophie Blanche, Cyanosis, Hemosiderin Staining, Mottled, Pallor, Rubor, Erythema. Periwound temperature was noted as Hot. The periwound has tenderness on palpation. Assessment Active Problems ICD-10 L02.211 - Cutaneous abscess of abdominal wall T81.31XD - Disruption of external operation (surgical) wound, not elsewhere classified, subsequent encounter Plan #1 change the primary dressing to Iodoflex #2 I am hopeful this will help the small area here. I did not probe the depth. There was no drainage Electronic Signature(s) Signed: 05/05/2018 8:24:56 AM By: Baltazar Najjarobson, Nai Borromeo MD Entered By: Baltazar Najjarobson, [place] on 05/04/2018 15:18:15 Debra BowieKANE, Debra A. ([id number]) -------------------------------------------------------------------------------- SuperBill Details Patient Name: Debra BowieKANE, Debra A. Date of Service: 05/04/2018 Medical Record Number: [id number] Patient Account Number: [id number] Date of Birth/Sex: 03/18/1961 (57 y.o. F) Treating RN: Huel CoventryWoody, [place] Primary Care Provider: Baruch GoutyLada, Melinda Other Clinician: Referring Provider: Baruch GoutyLada, Melinda Treating Provider/Extender: Altamese CarolinaOBSON, Berlie Hatchel G Weeks in Treatment: 5 Diagnosis Coding ICD-10 Codes Code Description L02.211 Cutaneous abscess of abdominal wall T81.31XD Disruption of external operation (surgical) wound, not elsewhere classified, subsequent encounter Facility Procedures CPT4 Code: [id number] Description: 99213 - WOUND CARE VISIT-LEV 3 EST PT Modifier: Quantity: 1 Physician Procedures CPT4: Description Modifier Quantity Code [id number] 99212 - WC PHYS LEVEL 2 - EST PT 1 ICD-10 Diagnosis Description L02.211 Cutaneous abscess of abdominal wall T81.31XD Disruption of external operation (surgical) wound, not elsewhere classified, subsequent  encounter Electronic Signature(s) Signed:  05/04/2018 5:16:35 PM By: Elliot GurneyWoody, BSN, RN, CWS, Kameo RN, BSN Signed: 05/05/2018 8:24:56 AM By: Baltazar Najjarobson, Thirza Pellicano MD Entered By: Elliot GurneyWoody, BSN, RN, CWS, [place] on 05/04/2018 17:16:34

## 2018-05-08 NOTE — Progress Notes (Signed)
Debra [name], Abel A. ([id number]) Visit Report for 05/04/2018 Arrival Information Details Patient Name: Debra [name], Debra A. Date of Service: 05/04/2018 2:15 PM Medical Record Number: [id number] Patient Account Number: [id number] Date of Birth/Sex: [birth year] (57 y.o. F) Treating RN: Phillis HaggisPinkerton, Debi Primary Care Aayliah Rotenberry: Baruch GoutyLada, Melinda Other Clinician: Referring Lakara Weiland: Baruch GoutyLada, Melinda Treating Laneisha Mino/Extender: Altamese CarolinaOBSON, MICHAEL G Weeks in Treatment: 5 Visit Information History Since Last Visit All ordered tests and consults were completed: No Patient Arrived: Ambulatory Added or deleted any medications: No Arrival Time: 14:24 Any new allergies or adverse reactions: No Accompanied By: spouse Had a fall or experienced change in No Transfer Assistance: None activities of daily living that may affect Patient Identification Verified: Yes risk of falls: Secondary Verification Process Completed: Yes Signs or symptoms of abuse/neglect since last visito No Patient Requires Transmission-Based No Hospitalized since last visit: No Precautions: Implantable device outside of the clinic excluding No Patient Has Alerts: Yes cellular tissue based products placed in the center Patient Alerts: DM II since last visit: Has Dressing in Place as Prescribed: Yes Pain Present Now: No Electronic Signature(s) Signed: 05/06/2018 5:38:20 PM By: Alejandro MullingPinkerton, Debra Entered By: Alejandro MullingPinkerton, Debra on 05/04/2018 14:24:30 Debra [name], Debra A. ([id number]) -------------------------------------------------------------------------------- Clinic Level of Care Assessment Details Patient Name: Debra [name], Debra A. Date of Service: 05/04/2018 2:15 PM Medical Record Number: [id number] Patient Account Number: [id number] Date of Birth/Sex: [birth year] (57 y.o. F) Treating RN: Huel CoventryWoody, Keashia Primary Care Bettina Warn: Baruch GoutyLada, Melinda Other Clinician: Referring Kaire Stary: Baruch GoutyLada, Melinda Treating Elidia Bonenfant/Extender: Altamese CarolinaOBSON, MICHAEL G Weeks in Treatment: 5 Clinic Level  of Care Assessment Items TOOL 4 Quantity Score []  - Use when only an EandM is performed on FOLLOW-UP visit 0 ASSESSMENTS - Nursing Assessment / Reassessment X - Reassessment of Co-morbidities (includes updates in patient status) 1 10 X- 1 5 Reassessment of Adherence to Treatment Plan ASSESSMENTS - Wound and Skin Assessment / Reassessment X - Simple Wound Assessment / Reassessment - one wound 1 5 []  - 0 Complex Wound Assessment / Reassessment - multiple wounds []  - 0 Dermatologic / Skin Assessment (not related to wound area) ASSESSMENTS - Focused Assessment []  - Circumferential Edema Measurements - multi extremities 0 []  - 0 Nutritional Assessment / Counseling / Intervention []  - 0 Lower Extremity Assessment (monofilament, tuning fork, pulses) []  - 0 Peripheral Arterial Disease Assessment (using hand held doppler) ASSESSMENTS - Ostomy and/or Continence Assessment and Care []  - Incontinence Assessment and Management 0 []  - 0 Ostomy Care Assessment and Management (repouching, etc.) PROCESS - Coordination of Care X - Simple Patient / Family Education for ongoing care 1 15 []  - 0 Complex (extensive) Patient / Family Education for ongoing care []  - 0 Staff obtains ChiropractorConsents, Records, Test Results / Process Orders []  - 0 Staff telephones HHA, Nursing Homes / Clarify orders / etc []  - 0 Routine Transfer to another Facility (non-emergent condition) []  - 0 Routine Hospital Admission (non-emergent condition) []  - 0 New Admissions / Manufacturing engineernsurance Authorizations / Ordering NPWT, Apligraf, etc. []  - 0 Emergency Hospital Admission (emergent condition) X- 1 10 Simple Discharge Coordination Christin FudgeKANE, Jamyah A. ([id number]) []  - 0 Complex (extensive) Discharge Coordination PROCESS - Special Needs []  - Pediatric / Minor Patient Management 0 []  - 0 Isolation Patient Management []  - 0 Hearing / Language / Visual special needs []  - 0 Assessment of Community assistance (transportation, D/C planning,  etc.) []  - 0 Additional assistance / Altered mentation []  - 0 Support Surface(s) Assessment (bed, cushion, seat, etc.) INTERVENTIONS - Wound Cleansing / Measurement X - Simple  Wound Cleansing - one wound 1 5 []  - 0 Complex Wound Cleansing - multiple wounds X- 1 5 Wound Imaging (photographs - any number of wounds) []  - 0 Wound Tracing (instead of photographs) X- 1 5 Simple Wound Measurement - one wound []  - 0 Complex Wound Measurement - multiple wounds INTERVENTIONS - Wound Dressings []  - Small Wound Dressing one or multiple wounds 0 X- 1 15 Medium Wound Dressing one or multiple wounds []  - 0 Large Wound Dressing one or multiple wounds []  - 0 Application of Medications - topical []  - 0 Application of Medications - injection INTERVENTIONS - Miscellaneous []  - External ear exam 0 []  - 0 Specimen Collection (cultures, biopsies, blood, body fluids, etc.) []  - 0 Specimen(s) / Culture(s) sent or taken to Lab for analysis []  - 0 Patient Transfer (multiple staff / Nurse, adult / Similar devices) []  - 0 Simple Staple / Suture removal (25 or less) []  - 0 Complex Staple / Suture removal (26 or more) []  - 0 Hypo / Hyperglycemic Management (close monitor of Blood Glucose) []  - 0 Ankle / Brachial Index (ABI) - do not check if billed separately X- 1 5 Vital Signs Lieser, Persephonie A. ([id number]) Has the patient been seen at the hospital within the last three years: Yes Total Score: 80 Level Of Care: New/Established - Level 3 Electronic Signature(s) Signed: 05/04/2018 5:21:30 PM By: Elliot Gurney, BSN, RN, CWS, Laquana RN, BSN Entered By: Elliot Gurney, BSN, RN, CWS, Armonie on 05/04/2018 17:16:09 Debra [name] ([id number]) -------------------------------------------------------------------------------- Encounter Discharge Information Details Patient Name: Debra [name]. Date of Service: 05/04/2018 2:15 PM Medical Record Number: [id number] Patient Account Number: [id number] Date of Birth/Sex: [birth year] (57 y.o.  F) Treating RN: Huel Coventry Primary Care Deneka Greenwalt: Baruch Gouty Other Clinician: Referring Sheily Lineman: Baruch Gouty Treating Deyon Chizek/Extender: Altamese [place] in Treatment: 5 Encounter Discharge Information Items Discharge Condition: Stable Ambulatory Status: Ambulatory Discharge Destination: Home Transportation: Private Auto Accompanied By: husband Schedule Follow-up Appointment: Yes Clinical Summary of Care: Electronic Signature(s) Signed: 05/04/2018 5:17:21 PM By: Elliot Gurney, BSN, RN, CWS, Hendel RN, BSN Entered By: Elliot Gurney, BSN, RN, CWS, Miyuki on 05/04/2018 17:17:21 Debra [name] ([id number]) -------------------------------------------------------------------------------- Lower Extremity Assessment Details Patient Name: Debra [name] Date of Service: 05/04/2018 2:15 PM Medical Record Number: [id number] Patient Account Number: [id number] Date of Birth/Sex: 03-08-1961 (57 y.o. F) Treating RN: Phillis Haggis Primary Care Dashon Mcintire: Baruch Gouty Other Clinician: Referring Cheston Coury: Baruch Gouty Treating Amare Bail/Extender: Maxwell Caul Weeks in Treatment: 5 Electronic Signature(s) Signed: 05/06/2018 5:38:20 PM By: Alejandro Mulling Entered By: Alejandro Mulling on 05/04/2018 14:29:30 Debra [name] ([id number]) -------------------------------------------------------------------------------- Multi Wound Chart Details Patient Name: Debra [name] Date of Service: 05/04/2018 2:15 PM Medical Record Number: [id number] Patient Account Number: [id number] Date of Birth/Sex: [birth year] (57 y.o. F) Treating RN: Huel Coventry Primary Care Charlis Harner: Baruch Gouty Other Clinician: Referring Jennah Satchell: Baruch Gouty Treating Eevee Borbon/Extender: Altamese [place] in Treatment: 5 Vital Signs Height(in): 65 Pulse(bpm): 67 Weight(lbs): 230 Blood Pressure(mmHg): 141/59 Body Mass Index(BMI): 38 Temperature(F): 97.7 Respiratory Rate 18 (breaths/min): Photos: [1:No Photos] [N/A:N/A] Wound  Location: [1:Left Groin] [N/A:N/A] Wounding Event: [1:Gradually Appeared] [N/A:N/A] Primary Etiology: [1:Abscess] [N/A:N/A] Comorbid History: [1:Hypertension, Type II Diabetes, Osteoarthritis] [N/A:N/A] Date Acquired: [1:03/23/2018] [N/A:N/A] Weeks of Treatment: [1:5] [N/A:N/A] Wound Status: [1:Open] [N/A:N/A] Measurements L x W x D [1:0.1x0.1x0.1] [N/A:N/A] (cm) Area (cm) : [1:0.008] [N/A:N/A] Volume (cm) : [1:0.001] [N/A:N/A] % Reduction in Area: [1:99.90%] [N/A:N/A] % Reduction in Volume: [1:100.00%] [N/A:N/A] Classification: [1:Full Thickness Without Exposed Support Structures] [N/A:N/A]  Exudate Amount: [1:None Present] [N/A:N/A] Wound Margin: [1:Distinct, outline attached] [N/A:N/A] Granulation Amount: [1:None Present (0%)] [N/A:N/A] Necrotic Amount: [1:Large (67-100%)] [N/A:N/A] Necrotic Tissue: [1:Eschar] [N/A:N/A] Exposed Structures: [1:Fat Layer (Subcutaneous Tissue) Exposed: Yes Fascia: No Tendon: No Muscle: No Joint: No Bone: No] [N/A:N/A] Epithelialization: [1:None] [N/A:N/A] Periwound Skin Texture: [1:Excoriation: No Induration: No Callus: No Crepitus: No Rash: No Scarring: No] [N/A:N/A] Periwound Skin Moisture: [N/A:N/A] Maceration: Yes Dry/Scaly: No Periwound Skin Color: Ecchymosis: Yes N/A N/A Atrophie Blanche: No Cyanosis: No Erythema: No Hemosiderin Staining: No Mottled: No Pallor: No Rubor: No Temperature: Hot N/A N/A Tenderness on Palpation: Yes N/A N/A Wound Preparation: Ulcer Cleansing: N/A N/A Rinsed/Irrigated with Saline Topical Anesthetic Applied: Other: lidocaine 4% Treatment Notes Electronic Signature(s) Signed: 05/05/2018 8:24:56 AM By: Baltazar Najjar MD Entered By: Baltazar Najjar on 05/04/2018 14:59:26 Debra [name] ([id number]) -------------------------------------------------------------------------------- Multi-Disciplinary Care Plan Details Patient Name: Debra [name] Date of Service: 05/04/2018 2:15 PM Medical Record Number:  [id number] Patient Account Number: [id number] Date of Birth/Sex: 1961-03-03 (57 y.o. F) Treating RN: Huel Coventry Primary Care Audreyanna Butkiewicz: Baruch Gouty Other Clinician: Referring Danna Casella: Baruch Gouty Treating Kymiah Araiza/Extender: Altamese [place] in Treatment: 5 Active Inactive ` Orientation to the Wound Care Program Nursing Diagnoses: Knowledge deficit related to the wound healing center program Goals: Patient/caregiver will verbalize understanding of the Wound Healing Center Program Date Initiated: 04/06/2018 Target Resolution Date: 07/16/2018 Goal Status: Active Interventions: Provide education on orientation to the wound center Notes: ` Wound/Skin Impairment Nursing Diagnoses: Impaired tissue integrity Goals: Ulcer/skin breakdown will heal within 14 weeks Date Initiated: 04/06/2018 Target Resolution Date: 07/16/2018 Goal Status: Active Interventions: Assess patient/caregiver ability to obtain necessary supplies Assess patient/caregiver ability to perform ulcer/skin care regimen upon admission and as needed Assess ulceration(s) every visit Notes: Electronic Signature(s) Signed: 05/04/2018 5:21:30 PM By: Elliot Gurney, BSN, RN, CWS, Merrilee RN, BSN Entered By: Elliot Gurney, BSN, RN, CWS, Persais on 05/04/2018 14:35:06 Debra [name] ([id number]) -------------------------------------------------------------------------------- Pain Assessment Details Patient Name: Debra [name]. Date of Service: 05/04/2018 2:15 PM Medical Record Number: [id number] Patient Account Number: [id number] Date of Birth/Sex: 1961-06-17 (57 y.o. F) Treating RN: Phillis Haggis Primary Care Kamaya Keckler: Baruch Gouty Other Clinician: Referring Marbella Markgraf: Baruch Gouty Treating Nikita Humble/Extender: Altamese [place] in Treatment: 5 Active Problems Location of Pain Severity and Description of Pain Patient Has Paino No Site Locations Pain Management and Medication Current Pain Management: Electronic Signature(s) Signed:  05/06/2018 5:38:20 PM By: Alejandro Mulling Entered By: Alejandro Mulling on 05/04/2018 14:24:45 Debra [name] ([id number]) -------------------------------------------------------------------------------- Patient/Caregiver Education Details Patient Name: Debra [name]. Date of Service: 05/04/2018 2:15 PM Medical Record Number: [id number] Patient Account Number: [id number] Date of Birth/Gender: November 04, 1961 (57 y.o. F) Treating RN: Huel Coventry Primary Care Physician: Baruch Gouty Other Clinician: Referring Physician: Baruch Gouty Treating Physician/Extender: Altamese [place] in Treatment: 5 Education Assessment Education Provided To: Patient Education Topics Provided Wound/Skin Impairment: Handouts: Caring for Your Ulcer Methods: Demonstration, Explain/Verbal Responses: State content correctly Electronic Signature(s) Signed: 05/04/2018 5:21:30 PM By: Elliot Gurney, BSN, RN, CWS, Weslynn RN, BSN Entered By: Elliot Gurney, BSN, RN, CWS, William on 05/04/2018 17:17:36 Debra [name] ([id number]) -------------------------------------------------------------------------------- Wound Assessment Details Patient Name: Debra [name] Date of Service: 05/04/2018 2:15 PM Medical Record Number: [id number] Patient Account Number: [id number] Date of Birth/Sex: [birth year] (57 y.o. F) Treating RN: Phillis Haggis Primary Care Dereck Agerton: Baruch Gouty Other Clinician: Referring Rudie Rikard: Baruch Gouty Treating Marc Leichter/Extender: Altamese [place] in Treatment: 5 Wound Status Wound Number: 1 Primary Etiology: Abscess Wound Location: Left Groin Wound  Status: Open Wounding Event: Gradually Appeared Comorbid Hypertension, Type II Diabetes, History: Osteoarthritis Date Acquired: 03/23/2018 Weeks Of Treatment: 5 Clustered Wound: No Wound Measurements Length: (cm) 0.1 Width: (cm) 0.1 Depth: (cm) 0.1 Area: (cm) 0.008 Volume: (cm) 0.001 % Reduction in Area: 99.9% % Reduction in Volume: 100% Epithelialization:  None Tunneling: No Undermining: No Wound Description Full Thickness Without Exposed Support Classification: Structures Wound Margin: Distinct, outline attached Exudate None Present Amount: Foul Odor After Cleansing: No Slough/Fibrino Yes Wound Bed Granulation Amount: None Present (0%) Exposed Structure Necrotic Amount: Large (67-100%) Fascia Exposed: No Necrotic Quality: Eschar Fat Layer (Subcutaneous Tissue) Exposed: Yes Tendon Exposed: No Muscle Exposed: No Joint Exposed: No Bone Exposed: No Periwound Skin Texture Texture Color No Abnormalities Noted: No No Abnormalities Noted: No Callus: No Atrophie Blanche: No Crepitus: No Cyanosis: No Excoriation: No Ecchymosis: Yes Induration: No Erythema: No Rash: No Hemosiderin Staining: No Scarring: No Mottled: No Pallor: No Moisture Rubor: No No Abnormalities Noted: No Dry / Scaly: No Temperature / Pain Maceration: Yes Temperature: Hot Tenderness on Palpation: Yes ABBE, BULA ([id number]) Wound Preparation Ulcer Cleansing: Rinsed/Irrigated with Saline Topical Anesthetic Applied: Other: lidocaine 4%, Treatment Notes Wound #1 (Left Groin) 1. Cleansed with: Clean wound with Normal Saline 2. Anesthetic Topical Lidocaine 4% cream to wound bed prior to debridement 4. Dressing Applied: Iodoflex 5. Secondary Dressing Applied Bordered Foam Dressing Electronic Signature(s) Signed: 05/06/2018 5:38:20 PM By: Alejandro Mulling Entered By: Alejandro Mulling on 05/04/2018 14:31:35 Debra [name] ([id number]) -------------------------------------------------------------------------------- Vitals Details Patient Name: Debra [name] Date of Service: 05/04/2018 2:15 PM Medical Record Number: [id number] Patient Account Number: [id number] Date of Birth/Sex: [birth year] (57 y.o. F) Treating RN: Phillis Haggis Primary Care Shondra Capps: Baruch Gouty Other Clinician: Referring Vivienne Sangiovanni: Baruch Gouty Treating Romonia Yanik/Extender:  Altamese [place] in Treatment: 5 Vital Signs Time Taken: 14:24 Temperature (F): 97.7 Height (in): 65 Pulse (bpm): 67 Weight (lbs): 230 Respiratory Rate (breaths/min): 18 Body Mass Index (BMI): 38.3 Blood Pressure (mmHg): 141/59 Reference Range: 80 - 120 mg / dl Electronic Signature(s) Signed: 05/06/2018 5:38:20 PM By: Alejandro Mulling Entered By: Alejandro Mulling on 05/04/2018 14:29:15

## 2018-05-11 ENCOUNTER — Encounter: Payer: Managed Care, Other (non HMO) | Attending: Internal Medicine | Admitting: Internal Medicine

## 2018-05-11 DIAGNOSIS — E1151 Type 2 diabetes mellitus with diabetic peripheral angiopathy without gangrene: Secondary | ICD-10-CM | POA: Insufficient documentation

## 2018-05-11 DIAGNOSIS — G20 Parkinson's disease: Secondary | ICD-10-CM | POA: Insufficient documentation

## 2018-05-11 DIAGNOSIS — I10 Essential (primary) hypertension: Secondary | ICD-10-CM | POA: Insufficient documentation

## 2018-05-11 DIAGNOSIS — F319 Bipolar disorder, unspecified: Secondary | ICD-10-CM | POA: Diagnosis not present

## 2018-05-11 DIAGNOSIS — J449 Chronic obstructive pulmonary disease, unspecified: Secondary | ICD-10-CM | POA: Diagnosis not present

## 2018-05-11 DIAGNOSIS — L02214 Cutaneous abscess of groin: Secondary | ICD-10-CM | POA: Insufficient documentation

## 2018-05-12 ENCOUNTER — Other Ambulatory Visit: Payer: Self-pay

## 2018-05-12 ENCOUNTER — Encounter: Payer: Self-pay | Admitting: *Deleted

## 2018-05-12 ENCOUNTER — Inpatient Hospital Stay
Admission: EM | Admit: 2018-05-12 | Discharge: 2018-05-13 | DRG: 372 | Disposition: A | Discharge status: Home / Self Care | Payer: Managed Care, Other (non HMO) | Attending: Internal Medicine | Admitting: Internal Medicine

## 2018-05-12 DIAGNOSIS — Z825 Family history of asthma and other chronic lower respiratory diseases: Secondary | ICD-10-CM

## 2018-05-12 DIAGNOSIS — I10 Essential (primary) hypertension: Secondary | ICD-10-CM | POA: Diagnosis present

## 2018-05-12 DIAGNOSIS — E785 Hyperlipidemia, unspecified: Secondary | ICD-10-CM | POA: Diagnosis present

## 2018-05-12 DIAGNOSIS — E119 Type 2 diabetes mellitus without complications: Secondary | ICD-10-CM

## 2018-05-12 DIAGNOSIS — Z6841 Body Mass Index (BMI) 40.0 and over, adult: Secondary | ICD-10-CM | POA: Diagnosis not present

## 2018-05-12 DIAGNOSIS — Z794 Long term (current) use of insulin: Secondary | ICD-10-CM

## 2018-05-12 DIAGNOSIS — A0472 Enterocolitis due to Clostridium difficile, not specified as recurrent: Secondary | ICD-10-CM | POA: Diagnosis present

## 2018-05-12 DIAGNOSIS — Z833 Family history of diabetes mellitus: Secondary | ICD-10-CM

## 2018-05-12 DIAGNOSIS — Z79899 Other long term (current) drug therapy: Secondary | ICD-10-CM | POA: Diagnosis not present

## 2018-05-12 DIAGNOSIS — E1151 Type 2 diabetes mellitus with diabetic peripheral angiopathy without gangrene: Secondary | ICD-10-CM

## 2018-05-12 DIAGNOSIS — E1165 Type 2 diabetes mellitus with hyperglycemia: Secondary | ICD-10-CM | POA: Diagnosis present

## 2018-05-12 DIAGNOSIS — Z882 Allergy status to sulfonamides status: Secondary | ICD-10-CM | POA: Diagnosis not present

## 2018-05-12 DIAGNOSIS — Z885 Allergy status to narcotic agent status: Secondary | ICD-10-CM | POA: Diagnosis not present

## 2018-05-12 DIAGNOSIS — A0471 Enterocolitis due to Clostridium difficile, recurrent: Secondary | ICD-10-CM | POA: Diagnosis not present

## 2018-05-12 DIAGNOSIS — M8580 Other specified disorders of bone density and structure, unspecified site: Secondary | ICD-10-CM | POA: Diagnosis present

## 2018-05-12 DIAGNOSIS — M1990 Unspecified osteoarthritis, unspecified site: Secondary | ICD-10-CM | POA: Diagnosis present

## 2018-05-12 DIAGNOSIS — F329 Major depressive disorder, single episode, unspecified: Secondary | ICD-10-CM | POA: Diagnosis present

## 2018-05-12 DIAGNOSIS — Z818 Family history of other mental and behavioral disorders: Secondary | ICD-10-CM

## 2018-05-12 DIAGNOSIS — Z7982 Long term (current) use of aspirin: Secondary | ICD-10-CM

## 2018-05-12 DIAGNOSIS — F17210 Nicotine dependence, cigarettes, uncomplicated: Secondary | ICD-10-CM | POA: Diagnosis present

## 2018-05-12 DIAGNOSIS — I70209 Unspecified atherosclerosis of native arteries of extremities, unspecified extremity: Secondary | ICD-10-CM

## 2018-05-12 DIAGNOSIS — Z90710 Acquired absence of both cervix and uterus: Secondary | ICD-10-CM

## 2018-05-12 DIAGNOSIS — IMO0002 Reserved for concepts with insufficient information to code with codable children: Secondary | ICD-10-CM

## 2018-05-12 DIAGNOSIS — I700 Atherosclerosis of aorta: Secondary | ICD-10-CM | POA: Diagnosis present

## 2018-05-12 DIAGNOSIS — Z888 Allergy status to other drugs, medicaments and biological substances status: Secondary | ICD-10-CM

## 2018-05-12 DIAGNOSIS — G20 Parkinson's disease: Secondary | ICD-10-CM | POA: Diagnosis present

## 2018-05-12 DIAGNOSIS — F419 Anxiety disorder, unspecified: Secondary | ICD-10-CM | POA: Diagnosis present

## 2018-05-12 DIAGNOSIS — G4700 Insomnia, unspecified: Secondary | ICD-10-CM | POA: Diagnosis present

## 2018-05-12 DIAGNOSIS — Z9049 Acquired absence of other specified parts of digestive tract: Secondary | ICD-10-CM | POA: Diagnosis not present

## 2018-05-12 DIAGNOSIS — R55 Syncope and collapse: Secondary | ICD-10-CM

## 2018-05-12 LAB — COMPREHENSIVE METABOLIC PANEL
ALBUMIN: 4 g/dL (ref 3.5–5.0)
ALK PHOS: 78 U/L (ref 38–126)
ALT: 21 U/L (ref 14–54)
AST: 22 U/L (ref 15–41)
Anion gap: 10 (ref 5–15)
BILIRUBIN TOTAL: 0.5 mg/dL (ref 0.3–1.2)
BUN: 24 mg/dL — AB (ref 6–20)
CALCIUM: 9.7 mg/dL (ref 8.9–10.3)
CO2: 25 mmol/L (ref 22–32)
CREATININE: 0.86 mg/dL (ref 0.44–1.00)
Chloride: 102 mmol/L (ref 101–111)
GFR calc Af Amer: >60 mL/min (ref >60)
GFR calc non Af Amer: >60 mL/min (ref >60)
GLUCOSE: 183 mg/dL — AB (ref 65–99)
Potassium: 3.5 mmol/L (ref 3.5–5.1)
Sodium: 137 mmol/L (ref 135–145)
TOTAL PROTEIN: 6.8 g/dL (ref 6.5–8.1)

## 2018-05-12 LAB — CBC
HCT: 40.9 % (ref 35.0–47.0)
Hemoglobin: 13.9 g/dL (ref 12.0–16.0)
MCH: 32.1 pg (ref 26.0–34.0)
MCHC: 34.1 g/dL (ref 32.0–36.0)
MCV: 94.4 fL (ref 80.0–100.0)
PLATELETS: 302 10*3/uL (ref 150–440)
RBC: 4.34 MIL/uL (ref 3.80–5.20)
RDW: 14.2 % (ref 11.5–14.5)
WBC: 12.7 10*3/uL — AB (ref 3.6–11.0)

## 2018-05-12 LAB — GASTROINTESTINAL PANEL BY PCR, STOOL (REPLACES STOOL CULTURE)

## 2018-05-12 LAB — URINALYSIS, COMPLETE (UACMP) WITH MICROSCOPIC
BILIRUBIN URINE: NEGATIVE
Bacteria, UA: NONE SEEN
GLUCOSE, UA: NEGATIVE mg/dL
HGB URINE DIPSTICK: NEGATIVE
KETONES UR: NEGATIVE mg/dL
LEUKOCYTES UA: NEGATIVE
NITRITE: NEGATIVE
PROTEIN: NEGATIVE mg/dL
Specific Gravity, Urine: 1.015 (ref 1.005–1.030)
WBC UA: NONE SEEN WBC/hpf (ref 0–5)
pH: 7 (ref 5.0–8.0)

## 2018-05-12 LAB — C DIFFICILE QUICK SCREEN W PCR REFLEX
C DIFFICILE (CDIFF) TOXIN: POSITIVE — AB
C DIFFICLE (CDIFF) ANTIGEN: POSITIVE — AB
C Diff interpretation: DETECTED

## 2018-05-12 LAB — GLUCOSE, CAPILLARY
GLUCOSE-CAPILLARY: 154 mg/dL — AB (ref 65–99)
GLUCOSE-CAPILLARY: 77 mg/dL (ref 65–99)

## 2018-05-12 LAB — LIPASE, BLOOD: Lipase: 24 U/L (ref 11–51)

## 2018-05-12 MED ORDER — SODIUM CHLORIDE 0.9% FLUSH
3.0000 mL | Freq: Two times a day (BID) | INTRAVENOUS | Status: DC
  Administered 2018-05-12 – 2018-05-13 (×3): 3 mL via INTRAVENOUS

## 2018-05-12 MED ORDER — ONDANSETRON HCL 4 MG/2ML IJ SOLN
4.0000 mg | Freq: Once | INTRAMUSCULAR | Status: AC
  Administered 2018-05-12: 4 mg via INTRAVENOUS
  Filled 2018-05-12: qty 2

## 2018-05-12 MED ORDER — ENOXAPARIN SODIUM 40 MG/0.4ML ~~LOC~~ SOLN: 40.0000 mg | SUBCUTANEOUS | Status: DC

## 2018-05-12 MED ORDER — LISINOPRIL 10 MG PO TABS
40.0000 mg | ORAL_TABLET | Freq: Every day | ORAL | Status: DC
  Administered 2018-05-12 – 2018-05-13 (×2): 40 mg via ORAL
  Filled 2018-05-12 (×2): qty 4

## 2018-05-12 MED ORDER — ONDANSETRON HCL 4 MG PO TABS: 4.0000 mg | ORAL_TABLET | Freq: Four times a day (QID) | ORAL | Status: DC | PRN

## 2018-05-12 MED ORDER — ENOXAPARIN SODIUM 40 MG/0.4ML ~~LOC~~ SOLN
40.0000 mg | Freq: Two times a day (BID) | SUBCUTANEOUS | Status: DC
  Administered 2018-05-12 – 2018-05-13 (×2): 40 mg via SUBCUTANEOUS
  Filled 2018-05-12 (×2): qty 0.4

## 2018-05-12 MED ORDER — PROPRANOLOL HCL ER 80 MG PO CP24
80.0000 mg | ORAL_CAPSULE | Freq: Every day | ORAL | Status: DC
  Administered 2018-05-12 – 2018-05-13 (×2): 80 mg via ORAL
  Filled 2018-05-12 (×2): qty 1

## 2018-05-12 MED ORDER — PAROXETINE HCL 20 MG PO TABS
20.0000 mg | ORAL_TABLET | Freq: Every day | ORAL | Status: DC
  Administered 2018-05-12 – 2018-05-13 (×2): 20 mg via ORAL
  Filled 2018-05-12 (×2): qty 1

## 2018-05-12 MED ORDER — SODIUM CHLORIDE 0.9 % IV BOLUS
1000.0000 mL | Freq: Once | INTRAVENOUS | Status: AC
  Administered 2018-05-12: 1000 mL via INTRAVENOUS

## 2018-05-12 MED ORDER — INSULIN DEGLUDEC 200 UNIT/ML ~~LOC~~ SOPN: 110.0000 [IU] | PEN_INJECTOR | Freq: Every day | SUBCUTANEOUS | Status: DC

## 2018-05-12 MED ORDER — LACTATED RINGERS IV SOLN
INTRAVENOUS | Status: DC
  Administered 2018-05-12: 05:00:00 via INTRAVENOUS

## 2018-05-12 MED ORDER — INSULIN ASPART 100 UNIT/ML ~~LOC~~ SOLN: 35.0000 [IU] | Freq: Every day | SUBCUTANEOUS | Status: DC

## 2018-05-12 MED ORDER — OXYCODONE HCL 5 MG PO TABS
10.0000 mg | ORAL_TABLET | ORAL | Status: DC | PRN
  Administered 2018-05-12: 10 mg via ORAL
  Filled 2018-05-12: qty 2

## 2018-05-12 MED ORDER — VANCOMYCIN 50 MG/ML ORAL SOLUTION: 125.0000 mg | ORAL | Status: DC

## 2018-05-12 MED ORDER — ACETAMINOPHEN 325 MG PO TABS: 650.0000 mg | ORAL_TABLET | Freq: Four times a day (QID) | ORAL | Status: DC | PRN

## 2018-05-12 MED ORDER — INSULIN GLARGINE 100 UNIT/ML ~~LOC~~ SOLN
110.0000 [IU] | Freq: Every day | SUBCUTANEOUS | Status: DC
  Filled 2018-05-12 (×2): qty 1.1

## 2018-05-12 MED ORDER — SENNOSIDES-DOCUSATE SODIUM 8.6-50 MG PO TABS
1.0000 | ORAL_TABLET | Freq: Every evening | ORAL | Status: DC | PRN
Start: 2018-05-12 — End: 2018-05-13

## 2018-05-12 MED ORDER — VANCOMYCIN 50 MG/ML ORAL SOLUTION: 125.0000 mg | Freq: Every day | ORAL | Status: DC

## 2018-05-12 MED ORDER — VANCOMYCIN 50 MG/ML ORAL SOLUTION: 125.0000 mg | Freq: Two times a day (BID) | ORAL | Status: DC

## 2018-05-12 MED ORDER — HYDROCHLOROTHIAZIDE 25 MG PO TABS
25.0000 mg | ORAL_TABLET | Freq: Every day | ORAL | Status: DC
  Administered 2018-05-12 – 2018-05-13 (×2): 25 mg via ORAL
  Filled 2018-05-12 (×2): qty 1

## 2018-05-12 MED ORDER — INSULIN ASPART 100 UNIT/ML ~~LOC~~ SOLN
0.0000 [IU] | Freq: Three times a day (TID) | SUBCUTANEOUS | Status: DC
  Administered 2018-05-12: 3 [IU] via SUBCUTANEOUS
  Filled 2018-05-12: qty 1

## 2018-05-12 MED ORDER — OXYCODONE HCL 5 MG PO TABS
5.0000 mg | ORAL_TABLET | ORAL | Status: DC | PRN
  Administered 2018-05-12 – 2018-05-13 (×3): 5 mg via ORAL
  Filled 2018-05-12 (×4): qty 1

## 2018-05-12 MED ORDER — ONDANSETRON HCL 4 MG/2ML IJ SOLN: 4.0000 mg | Freq: Four times a day (QID) | INTRAMUSCULAR | Status: DC | PRN

## 2018-05-12 MED ORDER — VANCOMYCIN 50 MG/ML ORAL SOLUTION
125.0000 mg | Freq: Four times a day (QID) | ORAL | Status: DC
  Administered 2018-05-12 (×3): 125 mg via ORAL
  Filled 2018-05-12 (×4): qty 2.5

## 2018-05-12 MED ORDER — ACETAMINOPHEN 650 MG RE SUPP: 650.0000 mg | Freq: Four times a day (QID) | RECTAL | Status: DC | PRN

## 2018-05-12 MED ORDER — INSULIN GLARGINE 100 UNIT/ML ~~LOC~~ SOLN
25.0000 [IU] | Freq: Every day | SUBCUTANEOUS | Status: DC
  Administered 2018-05-12: 25 [IU] via SUBCUTANEOUS
  Filled 2018-05-12 (×2): qty 0.25

## 2018-05-12 MED ORDER — ASPIRIN EC 81 MG PO TBEC
81.0000 mg | DELAYED_RELEASE_TABLET | Freq: Every day | ORAL | Status: DC
  Administered 2018-05-12 – 2018-05-13 (×2): 81 mg via ORAL
  Filled 2018-05-12 (×2): qty 1

## 2018-05-12 MED ORDER — INSULIN ASPART 100 UNIT/ML ~~LOC~~ SOLN: 28.0000 [IU] | Freq: Every day | SUBCUTANEOUS | Status: DC

## 2018-05-12 MED ORDER — BISACODYL 5 MG PO TBEC: 5.0000 mg | DELAYED_RELEASE_TABLET | Freq: Every day | ORAL | Status: DC | PRN

## 2018-05-12 MED ORDER — FENTANYL CITRATE (PF) 100 MCG/2ML IJ SOLN
50.0000 ug | Freq: Once | INTRAMUSCULAR | Status: AC
Start: 2018-05-12 — End: 2018-05-12
  Administered 2018-05-12: 50 ug via INTRAVENOUS
  Filled 2018-05-12: qty 2

## 2018-05-12 NOTE — ED Triage Notes (Signed)
Pt to ED reporting a syncopal episode today and increased weakness since having been dx with Cdif on My 26th. Pt reports she just finished her medication but continues to have mucus like BMs. Pt is tearful in triage with increased RR. Pt continues to repeat, "I am so sick" in triage.

## 2018-05-12 NOTE — H&P (Addendum)
[employer] [facility] - [place] [facility]   PATIENT NAME: Debra [name]    MR#:  [id number]  DATE OF BIRTH:  [birth year]  DATE OF ADMISSION:  05/12/2018  PRIMARY CARE PHYSICIAN: Kerman Passey, MD   REQUESTING/REFERRING PHYSICIAN: Darci Current, MD  CHIEF COMPLAINT:   Chief Complaint  Patient presents with  . Weakness  . Loss of Consciousness    HISTORY OF PRESENT ILLNESS:  Debra [name]  is a 57 y.o. female with a known history of T2IDDM, recent Dx C. Difficile (s/p outpt Tx w/ PO vancomycin) p/w recurrent C. Difficile. States she was diagnosed on 05/14, took PO vanc from 05/14-05/28, w/ improvement in symptoms. States that after she stopped the medication, diarrhea gradually increased in frequency and volume. Stools have been green and slimy. (-) pus/blood in stool. Endorses L-sided crampy AP. Denies N/V, endorses good PO intake, though she states PO intake triggers diarrhea. Endorses fatigue/malaise/generalized weakness. States she had three syncopal episodes on Tuesday 06/05, but denies lightheadedness prior to LOC. Denies fever, chills, diaphoresis, night sweats, rigors. Endorses generalized weakness. Denies antidiarrheal medication use. WBC 12.7, SIRS (-).  PAST MEDICAL HISTORY:   Past Medical History:  Diagnosis Date  . Anxiety   . Arthritis   . C. difficile colitis   . Depression   . Diabetes mellitus without complication (HCC)    Type II  . Family history of adverse reaction to anesthesia    Mother - BP drops  . Head injury, closed, with brief LOC (HCC) 2011ish  . Hyperlipidemia   . Hypertension   . Insomnia   . Myalgia 03/29/2018   Due to statin  . Obesity   . Parkinson's disease (HCC)   . Seasonal allergies   . Tobacco abuse     PAST SURGICAL HISTORY:   Past Surgical History:  Procedure Laterality Date  . ABDOMINAL HYSTERECTOMY  2012  . BREAST BIOPSY Right   . CESAREAN SECTION  1992  . CHOLECYSTECTOMY  2013  . LUMBAR LAMINECTOMY/DECOMPRESSION  MICRODISCECTOMY Left 09/10/2015   Procedure: Left L4-5 Foraminotomy/Left L5-S1 Diskectomy;  Surgeon: Hilda Lias, MD;  Location: [facility];  Service: Neurosurgery;  Laterality: Left;  Left L4-5 Foraminotomy/Left L5-S1 Diskectomy    SOCIAL HISTORY:   Social History   Tobacco Use  . Smoking status: Current Every Day Smoker    Packs/day: 0.50    Years: 24.00    Pack years: 12.00    Types: Cigarettes  . Smokeless tobacco: Never Used  Substance Use Topics  . Alcohol use: Yes    Alcohol/week: 0.0 oz    Comment: occasional- rare    FAMILY HISTORY:   Family History  Problem Relation Age of Onset  . COPD Mother   . Bipolar disorder Father   . Diabetes Mellitus II Father   . Bipolar disorder Brother   . Diabetes Mellitus II Brother   . Bipolar disorder Brother     DRUG ALLERGIES:   Allergies  Allergen Reactions  . Metformin And Related Diarrhea  . Benadryl [Diphenhydramine Hcl] Swelling    blistering  . Canagliflozin Other (See Comments)    Other reaction(s): Other (See Comments) Low bp Low bp Low bp  . Depakote Er [Divalproex Sodium Er] Other (See Comments)    seizures  . Dilaudid [Hydromorphone Hcl] Other (See Comments)    seizures  . Diphenhydramine-Zinc Acetate Other (See Comments)  . Haloperidol Other (See Comments)  . Hydromorphone Other (See Comments)  . Neosporin [Neomycin-Bacitracin Zn-Polymyx] Swelling  blistering  . Singulair [Montelukast Sodium] Other (See Comments)    Asthma attacks  . Sitagliptin Other (See Comments)  . Statins Other (See Comments)    Other reaction(s): Unknown  . Sulfa Antibiotics Hives    Other reaction(s): Unknown  . Victoza [Liraglutide] Other (See Comments) and Nausea And Vomiting    Other reaction(s): Other (See Comments) pancreatitis pancreatitis    REVIEW OF SYSTEMS:   Review of Systems  Constitutional: Positive for malaise/fatigue. Negative for chills, diaphoresis, fever and weight loss.  HENT: Negative for  congestion, hearing loss, nosebleeds, sinus pain, sore throat and tinnitus.   Eyes: Negative for blurred vision, double vision and photophobia.  Respiratory: Negative for cough, hemoptysis, sputum production, shortness of breath and wheezing.   Cardiovascular: Negative for chest pain, palpitations, orthopnea, claudication, leg swelling and PND.  Gastrointestinal: Positive for abdominal pain and diarrhea. Negative for blood in stool, constipation, heartburn, melena, nausea and vomiting.  Genitourinary: Negative for dysuria, frequency, hematuria and urgency.  Musculoskeletal: Negative for back pain, joint pain, myalgias and neck pain.  Skin: Negative for itching and rash.  Neurological: Positive for loss of consciousness and weakness. Negative for dizziness, tingling, tremors, sensory change, speech change, focal weakness, seizures and headaches.  Psychiatric/Behavioral: Negative for memory loss. The patient does not have insomnia.    MEDICATIONS AT HOME:   Prior to Admission medications   Medication Sig Start Date End Date Taking? Authorizing Provider  ACCU-CHEK SMARTVIEW test strip  08/18/15  Yes [provider]  Alcohol Swabs (ALCOHOL PREP) PADS  08/18/15  Yes [provider]  aspirin EC 81 MG tablet Take 81 mg by mouth daily.   Yes [provider]  calcium citrate-vitamin D (CITRACAL+D) 315-200 MG-UNIT tablet Take 1 tablet by mouth 2 (two) times daily.   Yes [provider]  Cholecalciferol 5000 units capsule Take 5,000 Units by mouth.    Yes [provider]  folic acid (FOLVITE) 400 MCG tablet Take 800 mcg by mouth daily.    Yes [provider]  glucose blood (ONE TOUCH ULTRA TEST) test strip Check fingerstick blood sugars three times a day; LON 99 months; Dx E11.65 02/15/18  Yes Lada, Janit Bern, MD  hydrochlorothiazide (HYDRODIURIL) 25 MG tablet Take 1 tablet (25 mg total) by mouth daily. 03/04/18  Yes Lada, Janit Bern, MD  Insulin Lispro  (HUMALOG KWIKPEN) 200 UNIT/ML SOPN Inject 28 Units into the skin 3 (three) times daily with meals. Breakfast and lunch, and just 35 units at supper 03/04/18  Yes Lada, Janit Bern, MD  Insulin Pen Needle (BD ULTRA-FINE PEN NEEDLES) 29G X 12.7MM MISC USE THREE TIMES DAILY AS DIRECTED 11/12/17  Yes [provider]  lisinopril (PRINIVIL,ZESTRIL) 40 MG tablet Take 1 tablet (40 mg total) by mouth daily. 04/22/18  Yes Poulose, Percell Belt, NP  Magnesium 400 MG CAPS Take 250 mg by mouth daily.    Yes [provider]  Melatonin 10 MG TABS Take 1 tablet by mouth at bedtime.   Yes [provider]  Multiple Vitamins-Minerals (CENTRUM WOMEN) TABS Take 1 tablet by mouth.   Yes [provider]  Dola Argyle LANCETS 33G MISC  02/07/18  Yes [provider]  PARoxetine (PAXIL) 20 MG tablet Take 1 tablet (20 mg total) by mouth daily. 02/22/18  Yes Lada, Janit Bern, MD  pioglitazone (ACTOS) 30 MG tablet Take 1 tablet (30 mg total) by mouth daily. 03/29/18  Yes Lada, Janit Bern, MD  propranolol ER (INDERAL LA) 80 MG  24 hr capsule TAKE 1 CAPSULE (80 MG TOTAL) BY MOUTH ONCE DAILY 12/10/17  Yes [provider]  TRESIBA FLEXTOUCH 200 UNIT/ML SOPN Inject 110 Units into the skin at bedtime. 04/22/18  Yes Poulose, Percell Belt, NP  calcium carbonate (OSCAL) 1500 (600 Ca) MG TABS tablet Take by mouth 2 (two) times daily with a meal.    [provider]  WELCHOL 3.75 g PACK TAKE 1/2 PACKET TWICE A DAY WITH A MEAL MIXED IN 4-8 OZ OF WATER, JUICE, DIET SOFT DRINK Patient not taking: Reported on 05/12/2018 04/25/18   Kerman Passey, MD      VITAL SIGNS:  Blood pressure 137/65, pulse 78, temperature 98.1 F (36.7 C), temperature source Oral, resp. rate 18, height 5\' 5"  (1.651 m), weight 117.6 kg (259 lb 4.8 oz), SpO2 94 %.  PHYSICAL EXAMINATION:  Physical Exam  Constitutional: She is oriented to person, place, and time. She appears well-developed and well-nourished. She is  active and cooperative.  Non-toxic appearance. She does not have a sickly appearance. She does not appear ill. No distress. She is not intubated.  HENT:  Head: Normocephalic and atraumatic.  Mouth/Throat: Oropharynx is clear and moist. No oropharyngeal exudate.  Eyes: Conjunctivae, EOM and lids are normal. No scleral icterus.  Neck: Normal range of motion. No JVD present. No thyromegaly present.  Cardiovascular: Normal rate, regular rhythm, S1 normal, S2 normal and normal heart sounds.  No extrasystoles are present. Exam reveals no gallop, no S3, no S4, no distant heart sounds and no friction rub.  No murmur heard. Pulmonary/Chest: Effort normal and breath sounds normal. No accessory muscle usage or stridor. No apnea, no tachypnea and no bradypnea. She is not intubated. No respiratory distress. She has no decreased breath sounds. She has no wheezes. She has no rhonchi. She has no rales.  Abdominal: Soft. Bowel sounds are normal. She exhibits no distension. There is tenderness in the left upper quadrant and left lower quadrant. There is no rebound and no guarding.  Musculoskeletal: Normal range of motion. She exhibits no edema or tenderness.  Lymphadenopathy:    She has no cervical adenopathy.  Neurological: She is alert and oriented to person, place, and time. She is not disoriented.  Skin: Skin is warm, dry and intact. No rash noted. She is not diaphoretic. No erythema.  Psychiatric: She has a normal mood and affect. Her behavior is normal. Judgment and thought content normal. Cognition and memory are normal.   LABORATORY PANEL:   CBC Recent Labs  Lab 05/12/18 0140  WBC 12.7*  HGB 13.9  HCT 40.9  PLT 302   ------------------------------------------------------------------------------------------------------------------  Chemistries  Recent Labs  Lab 05/12/18 0140  NA 137  K 3.5  CL 102  CO2 25  GLUCOSE 183*  BUN 24*  CREATININE 0.86  CALCIUM 9.7  AST 22  ALT 21  ALKPHOS  78  BILITOT 0.5   ------------------------------------------------------------------------------------------------------------------  Cardiac Enzymes No results for input(s): TROPONINI in the last 168 hours. ------------------------------------------------------------------------------------------------------------------  RADIOLOGY:  No results found.  IMPRESSION AND PLAN:   A/P: 89F recurrent C. Difficile colitis, syncope, leukocytosis, T2IDDM w/ hyperglycemia. -C. Difficile (+), first recurrence, failed outpt PO vancomycin -Started on vancomycin taper -GI consult, consider Fidaxomicin, fecal txplnt -Enteric precautions -IVF -Tele, cardiac monitoring -Orthostatic VS -FSG qHS/AC -Neuro checks -Fall precautions -ASA -SSI, c/w home insulin dosing, hold PO antihyperglycemics -c/w home antihypertensives -Cardiac diabetic diet -Lovenox -Full code -Admission, > 2 midnights   All the records are reviewed and case  discussed with ED provider. Management plans discussed with the patient, family and they are in agreement.  CODE STATUS: Full code  TOTAL TIME TAKING CARE OF THIS PATIENT: 90 minutes.    Barbaraann RondoPrasanna Luberta Grabinski M.D on 05/12/2018 at 5:45 AM  Between 7am to 6pm - Pager - [phone]  After 6pm go to [email or web address] - Social research officer, governmentpassword EPAS [facility]  [facility]  Office  [phone]  CC: Primary care physician; Kerman PasseyLada, Melinda P, MD   Note: This dictation was prepared with Dragon dictation along with smaller phrase technology. Any transcriptional errors that result from this process are unintentional.

## 2018-05-12 NOTE — Care Management Note (Signed)
Case Management Note  Patient Details  Name: Debra [name] MRN: [id number] Date of Birth: 06/10/1961  Subjective/Objective:                  Ad[facility] with the diagnosis of C-diff colitis. Lives with husband, [name] [phone]). Last seen Dr. Sherie DonLada about a month ago. Appointment is scheduled for next Thursday. Prescriptions are [facility] [address] [facility] 10-15 years ago. Doesn't remember name of agency. No skilled facility. No home oxygen. Takes care of all basic activities of daily living herself, can drive, if needed. Fell yesterday. Lost 15-20 pounds the last couple of months.   Action/Plan: Received referral for medication and food assistance. Husband works at [employer] and [name] is drawing disability. States they pay over $600.00 dollars a month for [employer] insurance. Application for Medication Management given [facility] resource information given.  Expected Discharge Date:                  Expected Discharge Plan:     In-House Referral:   yes  Discharge planning Services   no  Post Acute Care Choice:    Choice offered to:     DME Arranged:    DME Agency:     HH Arranged:    HH Agency:     Status of Service:     If discussed at [employer] of [employer] Meetings, dates discussed:    Additional Comments:  Gwenette GreetBrenda S Maleka Contino, RN MSN CCM Care Management [phone] 05/12/2018, 3:02 PM

## 2018-05-12 NOTE — Progress Notes (Signed)
PHARMACIST - PHYSICIAN COMMUNICATION  CONCERNING:  Enoxaparin (Lovenox) for DVT Prophylaxis   RECOMMENDATION: Patient was prescribed enoxaprin 40mg  q24 hours for VTE prophylaxis.   Filed Weights   05/12/18 0134 05/12/18 0517  Weight: 251 lb (113.9 kg) 259 lb 4.8 oz (117.6 kg)   Body mass index is 43.15 kg/m.  Estimated Creatinine Clearance: 94.7 mL/min (by C-G formula based on SCr of 0.86 mg/dL).  Based on [facility] policy patient is candidate for enoxaparin 40mg  every 12 hour dosing due to BMI being >40.  DESCRIPTION: Pharmacy has adjusted enoxaparin dose per [facility] policy, approved through P & T committee.  Patient is now receiving enoxaparin 40mg  every 12 hours.   Gardner CandleSheema M Percy Winterrowd, PharmD, BCPS Clinical Pharmacist 05/12/2018 7:38 AM

## 2018-05-12 NOTE — ED Notes (Signed)
Date and time results received: 05/12/18 0403   Test: C-Diff Critical Value: Positive  Name of Provider Notified: Dr. Manson PasseyBrown  Orders Received? Or Actions Taken?: Acknowledged. Patient set to be admitted. Patient notified of positive test and intent to admit.

## 2018-05-12 NOTE — Consult Note (Addendum)
[name] , MD [address], [place], [place], [address] [address], [address], [place], [place], [address] Phone: [phone]  Fax: [phone]  Consultation  Referring Provider:  Dr Allena Katz Primary Care Physician:  Kerman Passey, MD Primary Gastroenterologist: None      Reason for Consultation:  C diff diarrhea  Date of Admission:  05/12/2018 Date of Consultation:  05/12/2018         HPI:   Debra [name] is a 57 y.o. female diagnosed with C diff colitis on 04/19/18 , treated with vancomycin . Admitted to the hospital with continued diarrhea , 3 episodes of syncope . Stool GI PCR negative.  She says that the diarrhea all began in May after she was treated for an abdominal cyst with clindamycin.  She says while on vancomycin for 2 weeks the diarrhea is almost resolved and once she ran out of her course the diarrhea began to recur.  Prior to coming into the hospital she had up to 10 or 15 bowel movements per day.  Some central abdominal pain.  Presently she has no abdominal pain.  Her last bowel movement was yesterday.  Overall she feels much better.     Past Medical History:  Diagnosis Date  . Anxiety   . Arthritis   . C. difficile colitis   . Depression   . Diabetes mellitus without complication (HCC)    Type II  . Family history of adverse reaction to anesthesia    Mother - BP drops  . Head injury, closed, with brief LOC (HCC) 2011ish  . Hyperlipidemia   . Hypertension   . Insomnia   . Myalgia 03/29/2018   Due to statin  . Obesity   . Parkinson's disease (HCC)   . Seasonal allergies   . Tobacco abuse     Past Surgical History:  Procedure Laterality Date  . ABDOMINAL HYSTERECTOMY  2012  . BREAST BIOPSY Right   . CESAREAN SECTION  1992  . CHOLECYSTECTOMY  2013  . LUMBAR LAMINECTOMY/DECOMPRESSION MICRODISCECTOMY Left 09/10/2015   Procedure: Left L4-5 Foraminotomy/Left L5-S1 Diskectomy;  Surgeon: Hilda Lias, MD;  Location: [facility];  Service:  Neurosurgery;  Laterality: Left;  Left L4-5 Foraminotomy/Left L5-S1 Diskectomy    Prior to Admission medications   Medication Sig Start Date End Date Taking? Authorizing Provider  ACCU-CHEK SMARTVIEW test strip  08/18/15  Yes [provider]  Alcohol Swabs (ALCOHOL PREP) PADS  08/18/15  Yes [provider]  aspirin EC 81 MG tablet Take 81 mg by mouth daily.   Yes [provider]  calcium citrate-vitamin D (CITRACAL+D) 315-200 MG-UNIT tablet Take 1 tablet by mouth 2 (two) times daily.   Yes [provider]  Cholecalciferol 5000 units capsule Take 5,000 Units by mouth.    Yes [provider]  folic acid (FOLVITE) 400 MCG tablet Take 800 mcg by mouth daily.    Yes [provider]  glucose blood (ONE TOUCH ULTRA TEST) test strip Check fingerstick blood sugars three times a day; LON 99 months; Dx E11.65 02/15/18  Yes Lada, Janit Bern, MD  hydrochlorothiazide (HYDRODIURIL) 25 MG tablet Take 1 tablet (25 mg total) by mouth daily. 03/04/18  Yes Lada, Janit Bern, MD  Insulin Lispro (HUMALOG KWIKPEN) 200 UNIT/ML SOPN Inject 28 Units into the skin 3 (three) times daily with meals. Breakfast and lunch, and just 35 units at supper 03/04/18  Yes Lada, Janit Bern, MD  Insulin Pen Needle (BD ULTRA-FINE PEN  NEEDLES) 29G X 12.7MM MISC USE THREE TIMES DAILY AS DIRECTED 11/12/17  Yes [provider]  lisinopril (PRINIVIL,ZESTRIL) 40 MG tablet Take 1 tablet (40 mg total) by mouth daily. 04/22/18  Yes Poulose, Percell BeltElizabeth E, NP  Magnesium 400 MG CAPS Take 250 mg by mouth daily.    Yes [provider]  Melatonin 10 MG TABS Take 1 tablet by mouth at bedtime.   Yes [provider]  Multiple Vitamins-Minerals (CENTRUM WOMEN) TABS Take 1 tablet by mouth.   Yes [provider]  Dola ArgyleNETOUCH DELICA LANCETS 33G MISC  02/07/18  Yes [provider]  PARoxetine (PAXIL) 20 MG tablet Take 1 tablet (20 mg total) by mouth daily. 02/22/18  Yes Lada,  Janit BernMelinda P, MD  pioglitazone (ACTOS) 30 MG tablet Take 1 tablet (30 mg total) by mouth daily. 03/29/18  Yes Lada, Janit BernMelinda P, MD  propranolol ER (INDERAL LA) 80 MG 24 hr capsule TAKE 1 CAPSULE (80 MG TOTAL) BY MOUTH ONCE DAILY 12/10/17  Yes [provider]  TRESIBA FLEXTOUCH 200 UNIT/ML SOPN Inject 110 Units into the skin at bedtime. 04/22/18  Yes Poulose, Percell BeltElizabeth E, NP  calcium carbonate (OSCAL) 1500 (600 Ca) MG TABS tablet Take by mouth 2 (two) times daily with a meal.    [provider]  WELCHOL 3.75 g PACK TAKE 1/2 PACKET TWICE A DAY WITH A MEAL MIXED IN 4-8 OZ OF WATER, JUICE, DIET SOFT DRINK Patient not taking: Reported on 05/12/2018 04/25/18   Kerman PasseyLada, Melinda P, MD    Family History  Problem Relation Age of Onset  . COPD Mother   . Bipolar disorder Father   . Diabetes Mellitus II Father   . Bipolar disorder Brother   . Diabetes Mellitus II Brother   . Bipolar disorder Brother      Social History   Tobacco Use  . Smoking status: Current Every Day Smoker    Packs/day: 0.50    Years: 24.00    Pack years: 12.00    Types: Cigarettes  . Smokeless tobacco: Never Used  Substance Use Topics  . Alcohol use: Yes    Alcohol/week: 0.0 oz    Comment: occasional- rare  . Drug use: No    Allergies as of 05/12/2018 - Review Complete 05/12/2018  Allergen Reaction Noted  . Metformin and related Diarrhea 07/22/2015  . Benadryl [diphenhydramine hcl] Swelling 05/22/2011  . Canagliflozin Other (See Comments) 05/06/2016  . Depakote er [divalproex sodium er] Other (See Comments) 05/29/2015  . Dilaudid [hydromorphone hcl] Other (See Comments) 05/29/2015  . Diphenhydramine-zinc acetate Other (See Comments) 12/11/2015  . Haloperidol Other (See Comments) 12/11/2015  . Hydromorphone Other (See Comments) 12/11/2015  . Neosporin [neomycin-bacitracin zn-polymyx] Swelling 05/29/2015  . Singulair [montelukast sodium] Other (See Comments) 05/29/2015  . Sitagliptin Other (See Comments)  12/11/2015  . Statins Other (See Comments) 02/23/2014  . Sulfa antibiotics Hives 02/23/2014  . Victoza [liraglutide] Other (See Comments) and Nausea And Vomiting 02/23/2014    Review of Systems:    All systems reviewed and negative except where noted in HPI.   Physical Exam:  Vital signs in last 24 hours: Temp:  [98 F (36.7 C)-99 F (37.2 C)] 98 F (36.7 C) (06/06 0730) Pulse Rate:  [67-80] 76 (06/06 0849) Resp:  [18-22] 18 (06/06 0517) BP: (136-187)/(65-85) 187/78 (06/06 0849) SpO2:  [91 %-97 %] 95 % (06/06 0730) Weight:  [251 lb (113.9 kg)-259 lb 4.8 oz (117.6 kg)] 259 lb 4.8 oz (117.6 kg) (06/06 0517) Last BM Date:  05/12/18 General:   Pleasant, cooperative in NAD Head:  Normocephalic and atraumatic. Eyes:   No icterus.   Conjunctiva pink. PERRLA. Ears:  Normal auditory acuity. Neck:  Supple; no masses or thyroidomegaly Lungs: Respirations even and unlabored. Lungs clear to auscultation bilaterally.   No wheezes, crackles, or rhonchi.  Heart:  Regular rate and rhythm;  Without murmur, clicks, rubs or gallops Abdomen:  Soft, nondistended, nontender. Normal bowel sounds. No appreciable masses or hepatomegaly.  No rebound or guarding.  Neurologic:  Alert and oriented x3;  grossly normal neurologically. Skin:  Intact without significant lesions or rashes. Cervical Nodes:  No significant cervical adenopathy. Psych:  Alert and cooperative. Normal affect.  LAB RESULTS: Recent Labs    05/12/18 0140  WBC 12.7*  HGB 13.9  HCT 40.9  PLT 302   BMET Recent Labs    05/12/18 0140  NA 137  K 3.5  CL 102  CO2 25  GLUCOSE 183*  BUN 24*  CREATININE 0.86  CALCIUM 9.7   LFT Recent Labs    05/12/18 0140  PROT 6.8  ALBUMIN 4.0  AST 22  ALT 21  ALKPHOS 78  BILITOT 0.5   PT/INR No results for input(s): LABPROT, INR in the last 72 hours.  STUDIES: No results found.  BP (!) 187/78   Pulse 76   Temp 98 F (36.7 C) (Oral)   Resp 18   Ht 5\' 5"  (1.651 m)   Wt 259 lb  4.8 oz (117.6 kg)   SpO2 95%   BMI 43.15 kg/m    Impression / Plan:   Debra [name] is a 57 y.o. y/o female with c diff colitis diagnosed over 2 weeks back , on vacomycin , initial improvement of symptoms but subsequently got worse after she ran out of her course of antibiotics, had continued diarrhea, syncope and admitted. Mild leucocytosis, no fever or tachycardia.   Plan  1.  Continue vancomycin 125 mg 4 times daily, as this is a second episode we will plan to gradually taper her over few weeks.  I would like her to stay on 125 mg 4 times daily until she sees me in the office in 1 to 3 weeks time and at that point of time I will gradually manage her dose reduction. 2.  Subsequently if the diarrhea  returns she would probably need a stool transplant.    Thank you for involving me in the care of this patient.      LOS: 0 days   [name], MD  05/12/2018, 8:54 AM

## 2018-05-12 NOTE — Progress Notes (Signed)
[facility]   PATIENT NAME: Debra [name]    MR#:  [id number]  DATE OF BIRTH:  12/08/1960  SUBJECTIVE:   Patient came in with increasing diarrhea and abdominal cramping. Found to have Clostridium difficile colitis which is recurrent REVIEW OF SYSTEMS:   Review of Systems  Constitutional: Negative for chills, fever and weight loss.  HENT: Negative for ear discharge, ear pain and nosebleeds.   Eyes: Negative for blurred vision, pain and discharge.  Respiratory: Negative for sputum production, shortness of breath, wheezing and stridor.   Cardiovascular: Negative for chest pain, palpitations, orthopnea and PND.  Gastrointestinal: Positive for diarrhea. Negative for abdominal pain, nausea and vomiting.  Genitourinary: Negative for frequency and urgency.  Musculoskeletal: Negative for back pain and joint pain.  Neurological: Positive for weakness. Negative for sensory change, speech change and focal weakness.  Psychiatric/Behavioral: Negative for depression and hallucinations. The patient is not nervous/anxious.    Tolerating Diet:yes Tolerating PT: ambulatory  DRUG ALLERGIES:   Allergies  Allergen Reactions  . Metformin And Related Diarrhea  . Benadryl [Diphenhydramine Hcl] Swelling    blistering  . Canagliflozin Other (See Comments)    Other reaction(s): Other (See Comments) Low bp Low bp Low bp  . Depakote Er [Divalproex Sodium Er] Other (See Comments)    seizures  . Dilaudid [Hydromorphone Hcl] Other (See Comments)    seizures  . Diphenhydramine-Zinc Acetate Other (See Comments)  . Haloperidol Other (See Comments)  . Hydromorphone Other (See Comments)  . Neosporin [Neomycin-Bacitracin Zn-Polymyx] Swelling    blistering  . Singulair [Montelukast Sodium] Other (See Comments)    Asthma attacks  . Sitagliptin Other (See Comments)  . Statins Other (See Comments)    Other reaction(s): Unknown  . Sulfa Antibiotics Hives    Other  reaction(s): Unknown  . Victoza [Liraglutide] Other (See Comments) and Nausea And Vomiting    Other reaction(s): Other (See Comments) pancreatitis pancreatitis    VITALS:  Blood pressure (!) 141/74, pulse 63, temperature 97.9 F (36.6 C), temperature source Oral, resp. rate 18, height 5\' 5"  (1.651 m), weight 117.6 kg (259 lb 4.8 oz), SpO2 94 %.  PHYSICAL EXAMINATION:   Physical Exam  GENERAL:  57 y.o.-year-old patient lying in the bed with no acute distress. Morbidly obese  EYES: Pupils equal, round, reactive to light and accommodation. No scleral icterus. Extraocular muscles intact.  HEENT: Head atraumatic, normocephalic. Oropharynx and nasopharynx clear.  NECK:  Supple, no jugular venous distention. No thyroid enlargement, no tenderness.  LUNGS: Normal breath sounds bilaterally, no wheezing, rales, rhonchi. No use of accessory muscles of respiration.  CARDIOVASCULAR: S1, S2 normal. No murmurs, rubs, or gallops.  ABDOMEN: Soft, nontender, nondistended. Bowel sounds present. No organomegaly or mass.  EXTREMITIES: No cyanosis, clubbing or edema b/l.    NEUROLOGIC: Cranial nerves II through XII are intact. No focal Motor or sensory deficits b/l.   PSYCHIATRIC:  patient is alert and oriented x 3.  SKIN: No obvious rash, lesion, or ulcer.   LABORATORY PANEL:  CBC Recent Labs  Lab 05/12/18 0140  WBC 12.7*  HGB 13.9  HCT 40.9  PLT 302    Chemistries  Recent Labs  Lab 05/12/18 0140  NA 137  K 3.5  CL 102  CO2 25  GLUCOSE 183*  BUN 24*  CREATININE 0.86  CALCIUM 9.7  AST 22  ALT 21  ALKPHOS 78  BILITOT 0.5   Cardiac Enzymes No results for input(s): TROPONINI in the last  168 hours. RADIOLOGY:  No results found. ASSESSMENT AND PLAN:  [name]  is a 57 y.o. female with a known history of T2IDDM, recent Dx C. Difficile (s/p outpt Tx w/ PO vancomycin) p/w recurrent C. Difficile. States she was diagnosed on 05/14, took PO vanc from 05/14-05/28, w/ improvement in symptoms.  States that after she stopped the medication, diarrhea gradually increased in frequency and volume. Stools have been green and slimy.  *Recurrent Clostridium difficile colitis -received IV fluids -patient encouraged to take yogurt in her diet -seen by G.I. recommends PO vancomycin four times a day until seen as outpatient in the clinic in the next couple weeks  *Type II diabetes -patient taking high doses of Lantus and as part at home. Sugars here are in the 70s 200. I have decreased her insulin dosage substantially. Continue sliding scale. -Patient will resume her insulin as her sugar starts climbing up.  *Hypertension continue home meds  *Hyperlipidemia continue statins  Continues to improve will discharge in 1 to 2 days. Patient is recommended to keep a counter offer diarrheal stools.   Case discussed with Care Management/Social Worker. Management plans discussed with the patient, family and they are in agreement.  CODE STATUS: full  DVT Prophylaxis: ambulatory  TOTAL TIME TAKING CARE OF THIS PATIENT: *25* minutes.  >50% time spent on counselling and coordination of care  POSSIBLE D/C IN 1-2 DAYS, DEPENDING ON CLINICAL CONDITION.  Note: This dictation was prepared with Dragon dictation along with smaller phrase technology. Any transcriptional errors that result from this process are unintentional.  Enedina Finner M.D on 05/12/2018 at 7:19 PM  Between 7am to 6pm - Pager - [phone]  After 6pm go to [email or web address] - Social research officer, government  [facility]  Office  [phone]  CC: Primary care physician; Kerman Passey, MDPatient ID: Debra [name], female   DOB: 09/01/1961, 57 y.o.   MRN: [id number]

## 2018-05-12 NOTE — ED Provider Notes (Signed)
[facility] Emergency Department Provider Note   First MD Initiated Contact with Patient 05/12/18 [phone]     (approximate)  I have reviewed the triage vital signs and the nursing notes.   HISTORY  Chief Complaint Weakness and Loss of Consciousness    HPI Debra [name] is a 57 y.o. female with below list of chronic medical conditions including recently diagnosed C. difficile colitis 04/19/2018 for which she was prescribed oral vancomycin at home return to the emergency department with continued diarrhea generalized weakness and 3 syncopal episodes today per the patient's husband.  Patient states while diarrhea briefly improved while taking vancomycin and it never completely resolved and subsequently worsened.  Patient admits to 5 out of 10 abdominal discomfort and nausea.  Patient denies any fever afebrile on presentation with a temperature 99.   Past Medical History:  Diagnosis Date  . Anxiety   . Arthritis   . C. difficile colitis   . Depression   . Diabetes mellitus without complication (HCC)    Type II  . Family history of adverse reaction to anesthesia    Mother - BP drops  . Head injury, closed, with brief LOC (HCC) 2011ish  . Hyperlipidemia   . Hypertension   . Insomnia   . Myalgia 03/29/2018   Due to statin  . Obesity   . Parkinson's disease (HCC)   . Seasonal allergies   . Tobacco abuse     Patient Active Problem List   Diagnosis Date Noted  . C. difficile colitis 05/12/2018  . Morbid obesity (HCC) 05/03/2018  . Myalgia 03/29/2018  . Osteopenia 03/16/2018  . Type 2 diabetes with decreased circulation (HCC) 03/04/2018  . Abdominal aortic atherosclerosis (HCC) 03/04/2018  . Adverse reaction to statin medication 03/04/2018  . Uncontrolled diabetes mellitus type 2 with atherosclerosis of arteries of extremities (HCC) 03/04/2018  . Hyperlipidemia 04/20/2017  . Acute low back pain with radicular symptoms, duration less than 6 weeks 08/28/2015    . Lumbar disc disease with radiculopathy 08/20/2015  . Lumbar herniated disc 08/20/2015  . Uncontrolled diabetes mellitus (HCC) 08/20/2015  . Left-sided low back pain with sciatica 06/03/2015  . Bipolar disorder (HCC) 06/03/2015  . Insomnia, persistent 06/03/2015  . BP (high blood pressure) 06/03/2015  . Leg swelling 06/03/2015  . Extreme obesity 06/03/2015  . Compulsive tobacco user syndrome 06/03/2015  . Phlebectasia 06/03/2015    Past Surgical History:  Procedure Laterality Date  . ABDOMINAL HYSTERECTOMY  2012  . BREAST BIOPSY Right   . CESAREAN SECTION  1992  . CHOLECYSTECTOMY  2013  . LUMBAR LAMINECTOMY/DECOMPRESSION MICRODISCECTOMY Left 09/10/2015   Procedure: Left L4-5 Foraminotomy/Left L5-S1 Diskectomy;  Surgeon: Hilda LiasErnesto Botero, MD;  Location: [facility];  Service: Neurosurgery;  Laterality: Left;  Left L4-5 Foraminotomy/Left L5-S1 Diskectomy    Prior to Admission medications   Medication Sig Start Date End Date Taking? Authorizing Provider  ACCU-CHEK SMARTVIEW test strip  08/18/15   [provider]  Alcohol Swabs (ALCOHOL PREP) PADS  08/18/15   [provider]  aspirin EC 81 MG tablet Take 81 mg by mouth daily.    [provider]  calcium carbonate (OSCAL) 1500 (600 Ca) MG TABS tablet Take by mouth 2 (two) times daily with a meal.    [provider]  Cholecalciferol 5000 units capsule Take 5,000 Units by mouth.     [provider]  folic acid (FOLVITE) 400 MCG tablet Take 800 mcg by mouth daily.  [provider]  glucose blood (ONE TOUCH ULTRA TEST) test strip Check fingerstick blood sugars three times a day; LON 99 months; Dx E11.65 02/15/18   Kerman Passey, MD  hydrochlorothiazide (HYDRODIURIL) 25 MG tablet Take 1 tablet (25 mg total) by mouth daily. 03/04/18   Lada, Janit Bern, MD  Insulin Lispro (HUMALOG KWIKPEN) 200 UNIT/ML SOPN Inject 28 Units into the skin 3 (three) times daily with meals. Breakfast and lunch,  and just 35 units at supper 03/04/18   Lada, Janit Bern, MD  Insulin Pen Needle (BD ULTRA-FINE PEN NEEDLES) 29G X 12.7MM MISC USE THREE TIMES DAILY AS DIRECTED 11/12/17   [provider]  lisinopril (PRINIVIL,ZESTRIL) 40 MG tablet Take 1 tablet (40 mg total) by mouth daily. 04/22/18   Poulose, Percell Belt, NP  Magnesium 400 MG CAPS Take 250 mg by mouth daily.     [provider]  Melatonin 10 MG TABS Take 1 tablet by mouth at bedtime.    [provider]  Multiple Vitamins-Minerals (CENTRUM WOMEN) TABS Take 1 tablet by mouth.    [provider]  Dola Argyle LANCETS 33G MISC  02/07/18   [provider]  PARoxetine (PAXIL) 20 MG tablet Take 1 tablet (20 mg total) by mouth daily. 02/22/18   Lada, Janit Bern, MD  pioglitazone (ACTOS) 30 MG tablet Take 1 tablet (30 mg total) by mouth daily. 03/29/18   Lada, Janit Bern, MD  propranolol ER (INDERAL LA) 80 MG 24 hr capsule TAKE 1 CAPSULE (80 MG TOTAL) BY MOUTH ONCE DAILY 12/10/17   [provider]  TRESIBA FLEXTOUCH 200 UNIT/ML SOPN Inject 110 Units into the skin at bedtime. 04/22/18   Poulose, Percell Belt, NP  WELCHOL 3.75 g PACK TAKE 1/2 PACKET TWICE A DAY WITH A MEAL MIXED IN 4-8 OZ OF WATER, JUICE, DIET SOFT DRINK 04/25/18   Lada, Janit Bern, MD    Allergies Metformin and related; Benadryl [diphenhydramine hcl]; Canagliflozin; Depakote er [divalproex sodium er]; Dilaudid [hydromorphone hcl]; Diphenhydramine-zinc acetate; Haloperidol; Hydromorphone; Neosporin [neomycin-bacitracin zn-polymyx]; Singulair [montelukast sodium]; Sitagliptin; Statins; Sulfa antibiotics; and Victoza [liraglutide]  Family History  Problem Relation Age of Onset  . COPD Mother   . Bipolar disorder Father   . Diabetes Mellitus II Father   . Bipolar disorder Brother   . Diabetes Mellitus II Brother   . Bipolar disorder Brother     Social History Social History   Tobacco Use  . Smoking status: Current Every Day Smoker     Packs/day: 0.50    Years: 24.00    Pack years: 12.00    Types: Cigarettes  . Smokeless tobacco: Never Used  Substance Use Topics  . Alcohol use: Yes    Alcohol/week: 0.0 oz    Comment: occasional- rare  . Drug use: No    Review of Systems Constitutional: No fever/chills Eyes: No visual changes. ENT: No sore throat. Cardiovascular: Denies chest pain. Respiratory: Denies shortness of breath. Gastrointestinal: Positive for abdominal pain nausea and diarrhea Genitourinary: Negative for dysuria. Musculoskeletal: Negative for neck pain.  Negative for back pain. Integumentary: Negative for rash. Neurological: Negative for headaches, focal weakness or numbness.   ____________________________________________   PHYSICAL EXAM:  VITAL SIGNS: ED Triage Vitals  Enc Vitals Group     BP 05/12/18 0135 (!) 157/85     Pulse Rate 05/12/18 0135 80     Resp 05/12/18 0300 20     Temp 05/12/18 0135 99 F (37.2 C)     Temp Source  05/12/18 0135 Oral     SpO2 05/12/18 0135 95 %     Weight 05/12/18 0134 113.9 kg (251 lb)     Height 05/12/18 0134 1.651 m (5\' 5" )     Head Circumference --      Peak Flow --      Pain Score 05/12/18 0135 8     Pain Loc --      Pain Edu? --      Excl. in GC? --     Constitutional: Alert and oriented. Well appearing and in no acute distress. Eyes: Conjunctivae are normal.  Head: Atraumatic. Mouth/Throat: Mucous membranes are dry.  Oropharynx non-erythematous. Neck: No stridor.   Cardiovascular: Normal rate, regular rhythm. Good peripheral circulation. Grossly normal heart sounds. Respiratory: Normal respiratory effort.  No retractions. Lungs CTAB. Gastrointestinal: Soft and nontender. No distention.  Musculoskeletal: No lower extremity tenderness nor edema. No gross deformities of extremities. Neurologic:  Normal speech and language. No gross focal neurologic deficits are appreciated.  Skin:  Skin is warm, dry and intact. No rash noted. Psychiatric: Mood  and affect are normal. Speech and behavior are normal.  ____________________________________________   LABS (all labs ordered are listed, but only abnormal results are displayed)  Labs Reviewed  C DIFFICILE QUICK SCREEN W PCR REFLEX - Abnormal; Notable for the following components:      Result Value   C Diff antigen POSITIVE (*)    C Diff toxin POSITIVE (*)    All other components within normal limits  COMPREHENSIVE METABOLIC PANEL - Abnormal; Notable for the following components:   Glucose, Bld 183 (*)    BUN 24 (*)    All other components within normal limits  CBC - Abnormal; Notable for the following components:   WBC 12.7 (*)    All other components within normal limits  URINALYSIS, COMPLETE (UACMP) WITH MICROSCOPIC - Abnormal; Notable for the following components:   Color, Urine YELLOW (*)    APPearance CLOUDY (*)    All other components within normal limits  GASTROINTESTINAL PANEL BY PCR, STOOL (REPLACES STOOL CULTURE)  LIPASE, BLOOD   ____________________________________________  EKG  ED ECG REPORT I, Graball N BROWN, the attending physician, personally viewed and interpreted this ECG.   Date: 05/12/2018  EKG Time: 1:34AM  Rate: 86  Rhythm: Normal sinus rhythm  Axis: Normal  Intervals:Normal  ST&T Change: None  ____________________________________________  Procedures   ____________________________________________   INITIAL IMPRESSION / ASSESSMENT AND PLAN / ED COURSE  As part of my medical decision making, I reviewed the following data within the electronic MEDICAL RECORD NUMBER   57 year old female presented with above-stated history and physical exam concern for persistent C. difficile infection failed outpatient management as such C. difficile stool culture was obtained which was positive for C. difficile.  Patient given 2 L IV normal saline secondary to clinical dehydration.  Patient discussed with Dr. Kerrin Mo for hospital admission for further evaluation  and management of C. difficile colitis ____________________________________________  FINAL CLINICAL IMPRESSION(S) / ED DIAGNOSES  Final diagnoses:  C. difficile colitis  Syncope, unspecified syncope type     MEDICATIONS GIVEN DURING THIS VISIT:  Medications  sodium chloride 0.9 % bolus 1,000 mL (1,000 mLs Intravenous New Bag/Given 05/12/18 0231)  ondansetron (ZOFRAN) injection 4 mg (4 mg Intravenous Given 05/12/18 0231)  fentaNYL (SUBLIMAZE) injection 50 mcg (50 mcg Intravenous Given 05/12/18 0231)     ED Discharge Orders    None       Note:  This  document was prepared using Conservation officer, historic buildings and may include unintentional dictation errors.    Darci Current, MD 05/12/18 [phone]

## 2018-05-13 LAB — HIV ANTIBODY (ROUTINE TESTING W REFLEX): HIV Screen 4th Generation wRfx: NONREACTIVE

## 2018-05-13 LAB — GLUCOSE, CAPILLARY: Glucose-Capillary: 91 mg/dL (ref 65–99)

## 2018-05-13 MED ORDER — VANCOMYCIN HCL 250 MG PO CAPS: 250.0000 mg | ORAL_CAPSULE | Freq: Four times a day (QID) | ORAL | 0 refills | Status: AC

## 2018-05-13 MED ORDER — TRESIBA FLEXTOUCH 200 UNIT/ML ~~LOC~~ SOPN
25.0000 [IU] | PEN_INJECTOR | Freq: Every day | SUBCUTANEOUS | Status: DC
Start: 2018-05-13 — End: 2019-03-06

## 2018-05-13 MED ORDER — VANCOMYCIN 50 MG/ML ORAL SOLUTION
125.0000 mg | Freq: Four times a day (QID) | ORAL | Status: DC
  Administered 2018-05-13: 125 mg via ORAL
  Filled 2018-05-13 (×2): qty 2.5

## 2018-05-13 MED ORDER — LISINOPRIL 40 MG PO TABS: 20.0000 mg | ORAL_TABLET | Freq: Every day | ORAL | 0 refills | Status: DC

## 2018-05-13 NOTE — Progress Notes (Signed)
Debra BowieKANE, Debra A. ([id number]) Visit Report for 05/11/2018 Arrival Information Details Patient Name: Debra BowieKANE, Debra A. Date of Service: 05/11/2018 2:00 PM Medical Record Number: [id number] Patient Account Number: [id number] Date of Birth/Sex: 12/17/1960 (57 y.o. F) Treating RN: Phillis HaggisPinkerton, Debi Primary Care Ravindra Baranek: Baruch GoutyLada, Melinda Other Clinician: Referring Annasofia Pohl: Baruch GoutyLada, Melinda Treating Alecia Doi/Extender: Altamese CarolinaOBSON, MICHAEL G Weeks in Treatment: 6 Visit Information History Since Last Visit All ordered tests and consults were completed: No Patient Arrived: Ambulatory Added or deleted any medications: No Arrival Time: 13:38 Any new allergies or adverse reactions: No Accompanied By: friend Had a fall or experienced change in No Transfer Assistance: None activities of daily living that may affect Patient Identification Verified: Yes risk of falls: Secondary Verification Process Completed: Yes Signs or symptoms of abuse/neglect since last visito No Patient Requires Transmission-Based No Hospitalized since last visit: No Precautions: Implantable device outside of the clinic excluding No Patient Has Alerts: Yes cellular tissue based products placed in the center Patient Alerts: DM II since last visit: Has Dressing in Place as Prescribed: Yes Pain Present Now: No Electronic Signature(s) Signed: 05/12/2018 2:43:37 PM By: Alejandro MullingPinkerton, Debra Entered By: Alejandro MullingPinkerton, Debra on 05/11/2018 13:38:54 Debra BowieKANE, Debra A. ([id number]) -------------------------------------------------------------------------------- Clinic Level of Care Assessment Details Patient Name: Debra BowieKANE, Debra A. Date of Service: 05/11/2018 2:00 PM Medical Record Number: [id number] Patient Account Number: [id number] Date of Birth/Sex: 12/17/1960 (57 y.o. F) Treating RN: Huel CoventryWoody, Charlei Primary Care Xavyer Steenson: Baruch GoutyLada, Melinda Other Clinician: Referring Desirey Keahey: Baruch GoutyLada, Melinda Treating Favor Hackler/Extender: Altamese CarolinaOBSON, MICHAEL G Weeks in Treatment: 6 Clinic Level of  Care Assessment Items TOOL 4 Quantity Score []  - Use when only an EandM is performed on FOLLOW-UP visit 0 ASSESSMENTS - Nursing Assessment / Reassessment []  - Reassessment of Co-morbidities (includes updates in patient status) 0 X- 1 5 Reassessment of Adherence to Treatment Plan ASSESSMENTS - Wound and Skin Assessment / Reassessment X - Simple Wound Assessment / Reassessment - one wound 1 5 []  - 0 Complex Wound Assessment / Reassessment - multiple wounds []  - 0 Dermatologic / Skin Assessment (not related to wound area) ASSESSMENTS - Focused Assessment []  - Circumferential Edema Measurements - multi extremities 0 []  - 0 Nutritional Assessment / Counseling / Intervention []  - 0 Lower Extremity Assessment (monofilament, tuning fork, pulses) []  - 0 Peripheral Arterial Disease Assessment (using hand held doppler) ASSESSMENTS - Ostomy and/or Continence Assessment and Care []  - Incontinence Assessment and Management 0 []  - 0 Ostomy Care Assessment and Management (repouching, etc.) PROCESS - Coordination of Care X - Simple Patient / Family Education for ongoing care 1 15 []  - 0 Complex (extensive) Patient / Family Education for ongoing care []  - 0 Staff obtains ChiropractorConsents, Records, Test Results / Process Orders []  - 0 Staff telephones HHA, Nursing Homes / Clarify orders / etc []  - 0 Routine Transfer to another Facility (non-emergent condition) []  - 0 Routine Hospital Admission (non-emergent condition) []  - 0 New Admissions / Manufacturing engineernsurance Authorizations / Ordering NPWT, Apligraf, etc. []  - 0 Emergency Hospital Admission (emergent condition) []  - 0 Simple Discharge Coordination Debra BowieKANE, Debra A. ([id number]) []  - 0 Complex (extensive) Discharge Coordination PROCESS - Special Needs []  - Pediatric / Minor Patient Management 0 []  - 0 Isolation Patient Management []  - 0 Hearing / Language / Visual special needs []  - 0 Assessment of Community assistance (transportation, D/C planning,  etc.) []  - 0 Additional assistance / Altered mentation []  - 0 Support Surface(s) Assessment (bed, cushion, seat, etc.) INTERVENTIONS - Wound Cleansing / Measurement X - Simple Wound  Cleansing - one wound 1 5 []  - 0 Complex Wound Cleansing - multiple wounds X- 1 5 Wound Imaging (photographs - any number of wounds) []  - 0 Wound Tracing (instead of photographs) []  - 0 Simple Wound Measurement - one wound []  - 0 Complex Wound Measurement - multiple wounds INTERVENTIONS - Wound Dressings []  - Small Wound Dressing one or multiple wounds 0 []  - 0 Medium Wound Dressing one or multiple wounds []  - 0 Large Wound Dressing one or multiple wounds []  - 0 Application of Medications - topical []  - 0 Application of Medications - injection INTERVENTIONS - Miscellaneous []  - External ear exam 0 []  - 0 Specimen Collection (cultures, biopsies, blood, body fluids, etc.) []  - 0 Specimen(s) / Culture(s) sent or taken to Lab for analysis []  - 0 Patient Transfer (multiple staff / Nurse, adult / Similar devices) []  - 0 Simple Staple / Suture removal (25 or less) []  - 0 Complex Staple / Suture removal (26 or more) []  - 0 Hypo / Hyperglycemic Management (close monitor of Blood Glucose) []  - 0 Ankle / Brachial Index (ABI) - do not check if billed separately X- 1 5 Vital Signs Laible, Debra A. ([id number]) Has the patient been seen at the hospital within the last three years: Yes Total Score: 40 Level Of Care: New/Established - Level 2 Electronic Signature(s) Signed: 05/12/2018 2:15:42 PM By: Elliot Gurney, BSN, RN, CWS, Ionna RN, BSN Entered By: Elliot Gurney, BSN, RN, CWS, Elenor on 05/12/2018 11:58:50 Debra [name] ([id number]) -------------------------------------------------------------------------------- Encounter Discharge Information Details Patient Name: Debra [name]. Date of Service: 05/11/2018 2:00 PM Medical Record Number: [id number] Patient Account Number: [id number] Date of Birth/Sex: Nov 19, 1961 (57 y.o.  F) Treating RN: Huel Coventry Primary Care Keianna Signer: Baruch Gouty Other Clinician: Referring Tandrea Kommer: Baruch Gouty Treating Mirage Pfefferkorn/Extender: Altamese [place] in Treatment: 6 Encounter Discharge Information Items Discharge Condition: Stable Ambulatory Status: Ambulatory Discharge Destination: Home Transportation: Private Auto Accompanied By: self Schedule Follow-up Appointment: No Clinical Summary of Care: Electronic Signature(s) Signed: 05/12/2018 11:59:30 AM By: Elliot Gurney, BSN, RN, CWS, Elleen RN, BSN Entered By: Elliot Gurney, BSN, RN, CWS, Zell on 05/12/2018 11:59:30 Debra [name] ([id number]) -------------------------------------------------------------------------------- Lower Extremity Assessment Details Patient Name: Debra [name] Date of Service: 05/11/2018 2:00 PM Medical Record Number: [id number] Patient Account Number: [id number] Date of Birth/Sex: Oct 27, 1961 (57 y.o. F) Treating RN: Phillis Haggis Primary Care Olga Seyler: Baruch Gouty Other Clinician: Referring Rylyn Ranganathan: Baruch Gouty Treating Madyn Ivins/Extender: Maxwell Caul Weeks in Treatment: 6 Electronic Signature(s) Signed: 05/12/2018 2:43:37 PM By: Alejandro Mulling Entered By: Alejandro Mulling on 05/11/2018 13:47:41 Debra [name] ([id number]) -------------------------------------------------------------------------------- Multi Wound Chart Details Patient Name: Debra [name] Date of Service: 05/11/2018 2:00 PM Medical Record Number: [id number] Patient Account Number: [id number] Date of Birth/Sex: November 15, 1961 (57 y.o. F) Treating RN: Huel Coventry Primary Care Hayly Litsey: Baruch Gouty Other Clinician: Referring Genene Kilman: Baruch Gouty Treating Trany Chernick/Extender: Altamese [place] in Treatment: 6 Vital Signs Height(in): 65 Pulse(bpm): 70 Weight(lbs): 230 Blood Pressure(mmHg): 127/57 Body Mass Index(BMI): 38 Temperature(F): 98.1 Respiratory Rate 18 (breaths/min): Photos: [N/A:N/A] Wound Location: Left Groin N/A  N/A Wounding Event: Gradually Appeared N/A N/A Primary Etiology: Abscess N/A N/A Comorbid History: Hypertension, Type II N/A N/A Diabetes, Osteoarthritis Date Acquired: 03/23/2018 N/A N/A Weeks of Treatment: 6 N/A N/A Wound Status: Healed - Epithelialized N/A N/A Measurements L x W x D 0x0x0 N/A N/A (cm) Area (cm) : 0 N/A N/A Volume (cm) : 0 N/A N/A % Reduction in Area: 100.00% N/A N/A % Reduction in Volume:  100.00% N/A N/A Classification: Full Thickness Without N/A N/A Exposed Support Structures Exudate Amount: None Present N/A N/A Wound Margin: Distinct, outline attached N/A N/A Granulation Amount: None Present (0%) N/A N/A Necrotic Amount: None Present (0%) N/A N/A Exposed Structures: Fat Layer (Subcutaneous N/A N/A Tissue) Exposed: Yes Fascia: No Tendon: No Muscle: No Joint: No Bone: No Epithelialization: Large (67-100%) N/A N/A Periwound Skin Texture: Excoriation: No N/A N/A Induration: No Callus: No ZARAH, CARBON A. ([id number]) Crepitus: No Rash: No Scarring: No Periwound Skin Moisture: Maceration: Yes N/A N/A Dry/Scaly: No Periwound Skin Color: Ecchymosis: Yes N/A N/A Atrophie Blanche: No Cyanosis: No Erythema: No Hemosiderin Staining: No Mottled: No Pallor: No Rubor: No Temperature: Hot N/A N/A Tenderness on Palpation: Yes N/A N/A Wound Preparation: Ulcer Cleansing: N/A N/A Rinsed/Irrigated with Saline Topical Anesthetic Applied: None Treatment Notes Electronic Signature(s) Signed: 05/12/2018 11:56:27 AM By: Elliot Gurney, BSN, RN, CWS, Orlanda RN, BSN Previous Signature: 05/11/2018 6:13:14 PM Version By: Baltazar Najjar MD Entered By: Elliot Gurney, BSN, RN, CWS, [place] on 05/12/2018 11:56:27 Debra [name] ([id number]) -------------------------------------------------------------------------------- Multi-Disciplinary Care Plan Details Patient Name: Debra [name]. Date of Service: 05/11/2018 2:00 PM Medical Record Number: [id number] Patient Account Number: [id number] Date of  Birth/Sex: 1961/07/19 (57 y.o. F) Treating RN: Huel Coventry Primary Care Donnovan Stamour: Baruch Gouty Other Clinician: Referring Quetzaly Ebner: Baruch Gouty Treating Misha Vanoverbeke/Extender: Altamese [place] in Treatment: 6 Active Inactive Electronic Signature(s) Signed: 05/12/2018 11:56:16 AM By: Elliot Gurney, BSN, RN, CWS, Shirell RN, BSN Entered By: Elliot Gurney, BSN, RN, CWS, [place] on 05/12/2018 11:56:15 Debra [name] ([id number]) -------------------------------------------------------------------------------- Pain Assessment Details Patient Name: Debra [name] Date of Service: 05/11/2018 2:00 PM Medical Record Number: [id number] Patient Account Number: [id number] Date of Birth/Sex: October 08, 1961 (57 y.o. F) Treating RN: Phillis Haggis Primary Care Joeleen Wortley: Baruch Gouty Other Clinician: Referring Cord Wilczynski: Baruch Gouty Treating Aurel Nguyen/Extender: Altamese [place] in Treatment: 6 Active Problems Location of Pain Severity and Description of Pain Patient Has Paino No Site Locations Pain Management and Medication Current Pain Management: Electronic Signature(s) Signed: 05/12/2018 2:43:37 PM By: Alejandro Mulling Entered By: Alejandro Mulling on 05/11/2018 13:38:59 Debra [name] ([id number]) -------------------------------------------------------------------------------- Patient/Caregiver Education Details Patient Name: Debra [name]. Date of Service: 05/11/2018 2:00 PM Medical Record Number: [id number] Patient Account Number: [id number] Date of Birth/Gender: [birth year] (57 y.o. F) Treating RN: Huel Coventry Primary Care Physician: Baruch Gouty Other Clinician: Referring Physician: Baruch Gouty Treating Physician/Extender: Altamese [place] in Treatment: 6 Education Assessment Education Provided To: Patient Education Topics Provided Wound/Skin Impairment: Handouts: Other: DC treatment complete Methods: Explain/Verbal Responses: State content correctly Electronic Signature(s) Signed: 05/12/2018 2:15:42  PM By: Elliot Gurney, BSN, RN, CWS, Angalena RN, BSN Entered By: Elliot Gurney, BSN, RN, CWS, [place] on 05/12/2018 12:00:04 Debra [name] ([id number]) -------------------------------------------------------------------------------- Wound Assessment Details Patient Name: Debra [name] Date of Service: 05/11/2018 2:00 PM Medical Record Number: [id number] Patient Account Number: [id number] Date of Birth/Sex: [birth year] (57 y.o. F) Treating RN: Huel Coventry Primary Care Nila Winker: Baruch Gouty Other Clinician: Referring Debra Colon: Baruch Gouty Treating Terriah Reggio/Extender: Altamese [place] in Treatment: 6 Wound Status Wound Number: 1 Primary Etiology: Abscess Wound Location: Left Groin Wound Status: Healed - Epithelialized Wounding Event: Gradually Appeared Comorbid Hypertension, Type II Diabetes, History: Osteoarthritis Date Acquired: 03/23/2018 Weeks Of Treatment: 6 Clustered Wound: No Photos Photo Uploaded By: Alejandro Mulling on 05/12/2018 07:57:13 Wound Measurements Length: (cm) 0 Width: (cm) 0 Depth: (cm) 0 Area: (cm) 0 Volume: (cm) 0 % Reduction in Area: 100% % Reduction in Volume: 100% Epithelialization: Large (67-100%) Tunneling:  No Undermining: No Wound Description Full Thickness Without Exposed Support Classification: Structures Wound Margin: Distinct, outline attached Exudate None Present Amount: Foul Odor After Cleansing: No Slough/Fibrino Yes Wound Bed Granulation Amount: None Present (0%) Exposed Structure Necrotic Amount: None Present (0%) Fascia Exposed: No Fat Layer (Subcutaneous Tissue) Exposed: Yes Tendon Exposed: No Muscle Exposed: No Joint Exposed: No Bone Exposed: No Periwound Skin Texture Texture Color No Abnormalities Noted: No No Abnormalities Noted: No Callus: No Atrophie Blanche: No JONEE, LAMORE ([id number]) Crepitus: No Cyanosis: No Excoriation: No Ecchymosis: Yes Induration: No Erythema: No Rash: No Hemosiderin Staining: No Scarring:  No Mottled: No Pallor: No Moisture Rubor: No No Abnormalities Noted: No Dry / Scaly: No Temperature / Pain Maceration: Yes Temperature: Hot Tenderness on Palpation: Yes Wound Preparation Ulcer Cleansing: Rinsed/Irrigated with Saline Topical Anesthetic Applied: None Electronic Signature(s) Signed: 05/12/2018 2:15:42 PM By: Elliot Gurney, BSN, RN, CWS, Lataya RN, BSN Entered By: Elliot Gurney, BSN, RN, CWS, Allaya on 05/11/2018 14:21:16 Debra [name] ([id number]) -------------------------------------------------------------------------------- Vitals Details Patient Name: Debra [name] Date of Service: 05/11/2018 2:00 PM Medical Record Number: [id number] Patient Account Number: [id number] Date of Birth/Sex: [birth year] (57 y.o. F) Treating RN: Phillis Haggis Primary Care Mouna Yager: Baruch Gouty Other Clinician: Referring Belita Warsame: Baruch Gouty Treating Dinh Ayotte/Extender: Altamese [place] in Treatment: 6 Vital Signs Time Taken: 13:39 Temperature (F): 98.1 Height (in): 65 Pulse (bpm): 70 Weight (lbs): 230 Respiratory Rate (breaths/min): 18 Body Mass Index (BMI): 38.3 Blood Pressure (mmHg): 127/57 Reference Range: 80 - 120 mg / dl Electronic Signature(s) Signed: 05/12/2018 2:43:37 PM By: Alejandro Mulling Entered By: Alejandro Mulling on 05/11/2018 13:42:21

## 2018-05-13 NOTE — Care Management (Signed)
Discharge to home today per Dr. Allena KatzPatel. States she will be able to afford her medications. Husband will transport Debra [name] RN MSN CCM Care Management [phone]

## 2018-05-13 NOTE — Discharge Summary (Signed)
[facility] [place] [facility]   PATIENT NAME: Debra [name]    MR#:  [id number]  DATE OF BIRTH:  [birth year]  DATE OF ADMISSION:  05/12/2018 ADMITTING PHYSICIAN: Barbaraann Rondo, MD   DATE OF DISCHARGE: 05/13/2018  PRIMARY CARE PHYSICIAN: Kerman Passey, MD    ADMISSION DIAGNOSIS:  C. difficile colitis [A04.72] Syncope, unspecified syncope type [R55]  DISCHARGE DIAGNOSIS:  Clostridium Difficile-Recurrent Diabetes Mellitus-2  SECONDARY DIAGNOSIS:   Past Medical History:  Diagnosis Date  . Anxiety   . Arthritis   . C. difficile colitis   . Depression   . Diabetes mellitus without complication (HCC)    Type II  . Family history of adverse reaction to anesthesia    Mother - BP drops  . Head injury, closed, with brief LOC (HCC) 2011ish  . Hyperlipidemia   . Hypertension   . Insomnia   . Myalgia 03/29/2018   Due to statin  . Obesity   . Parkinson's disease (HCC)   . Seasonal allergies   . Tobacco abuse     HOSPITAL COURSE:   KimKaneis a57 y.o.femalewith a known history of T2IDDM, recent Dx C. Difficile (s/p outpt Tx w/ PO vancomycin) p/w recurrent C. Difficile. States she was diagnosed on 05/14, took PO vanc from 05/14-05/28, w/ improvement in symptoms. States that after she stopped the medication, diarrhea gradually increased in frequency and volume. Stools have been green and slimy.  *Recurrent Clostridium difficile colitis -received IV fluids -patient encouraged to take yogurt in her diet -seen by G.I. recommends PO vancomycin four times a day until seen as outpatient in the clinic in the next couple weeks  *Type II diabetes -patient taking high doses of Lantus and as part at home. Sugars here are in the 70-99's. I have decreased her insulin dosage substantially. Continue sliding scale. -Patient will resume her insulin as her sugar starts climbing up.  *Hypertension - continue home meds  *Hyperlipidemia  -continue  statins  Pt feels overall better.   CONSULTS OBTAINED:  Treatment Team:  Barbaraann Rondo, MD [name], MD  DRUG ALLERGIES:   Allergies  Allergen Reactions  . Metformin And Related Diarrhea  . Benadryl [Diphenhydramine Hcl] Swelling    blistering  . Canagliflozin Other (See Comments)    Other reaction(s): Other (See Comments) Low bp Low bp Low bp  . Depakote Er [Divalproex Sodium Er] Other (See Comments)    seizures  . Dilaudid [Hydromorphone Hcl] Other (See Comments)    seizures  . Diphenhydramine-Zinc Acetate Other (See Comments)  . Haloperidol Other (See Comments)  . Hydromorphone Other (See Comments)  . Neosporin [Neomycin-Bacitracin Zn-Polymyx] Swelling    blistering  . Singulair [Montelukast Sodium] Other (See Comments)    Asthma attacks  . Sitagliptin Other (See Comments)  . Statins Other (See Comments)    Other reaction(s): Unknown  . Sulfa Antibiotics Hives    Other reaction(s): Unknown  . Victoza [Liraglutide] Other (See Comments) and Nausea And Vomiting    Other reaction(s): Other (See Comments) pancreatitis pancreatitis    DISCHARGE MEDICATIONS:   Allergies as of 05/13/2018      Reactions   Metformin And Related Diarrhea   Benadryl [diphenhydramine Hcl] Swelling   blistering   Canagliflozin Other (See Comments)   Other reaction(s): Other (See Comments) Low bp Low bp Low bp   Depakote Er [divalproex Sodium Er] Other (See Comments)   seizures   Dilaudid [hydromorphone Hcl] Other (See Comments)   seizures   Diphenhydramine-zinc Acetate  Other (See Comments)   Haloperidol Other (See Comments)   Hydromorphone Other (See Comments)   Neosporin [neomycin-bacitracin Zn-polymyx] Swelling   blistering   Singulair [montelukast Sodium] Other (See Comments)   Asthma attacks   Sitagliptin Other (See Comments)   Statins Other (See Comments)   Other reaction(s): Unknown   Sulfa Antibiotics Hives   Other reaction(s): Unknown   Victoza  [liraglutide] Other (See Comments), Nausea And Vomiting   Other reaction(s): Other (See Comments) pancreatitis pancreatitis      Medication List    STOP taking these medications   Alcohol Prep Pads   calcium carbonate 1500 (600 Ca) MG Tabs tablet Commonly known as:  OSCAL   hydrochlorothiazide 25 MG tablet Commonly known as:  HYDRODIURIL   Insulin Lispro 200 UNIT/ML Sopn Commonly known as:  HUMALOG KWIKPEN   pioglitazone 30 MG tablet Commonly known as:  ACTOS     TAKE these medications   aspirin EC 81 MG tablet Take 81 mg by mouth daily.   BD ULTRA-FINE PEN NEEDLES 29G X 12.7MM Misc Generic drug:  Insulin Pen Needle USE THREE TIMES DAILY AS DIRECTED   calcium citrate-vitamin D 315-200 MG-UNIT tablet Commonly known as:  CITRACAL+D Take 1 tablet by mouth 2 (two) times daily.   CENTRUM WOMEN Tabs Take 1 tablet by mouth.   Cholecalciferol 5000 units capsule Take 5,000 Units by mouth.   folic acid 400 MCG tablet Commonly known as:  FOLVITE Take 800 mcg by mouth daily.   glucose blood test strip Commonly known as:  ONE TOUCH ULTRA TEST Check fingerstick blood sugars three times a day; LON 99 months; Dx E11.65 What changed:  Another medication with the same name was removed. Continue taking this medication, and follow the directions you see here.   lisinopril 40 MG tablet Commonly known as:  PRINIVIL,ZESTRIL Take 0.5 tablets (20 mg total) by mouth daily. What changed:  how much to take   Magnesium 400 MG Caps Take 250 mg by mouth daily.   Melatonin 10 MG Tabs Take 1 tablet by mouth at bedtime.   ONETOUCH DELICA LANCETS 33G Misc   PARoxetine 20 MG tablet Commonly known as:  PAXIL Take 1 tablet (20 mg total) by mouth daily.   propranolol ER 80 MG 24 hr capsule Commonly known as:  INDERAL LA TAKE 1 CAPSULE (80 MG TOTAL) BY MOUTH ONCE DAILY   TRESIBA FLEXTOUCH 200 UNIT/ML Sopn Generic drug:  Insulin Degludec Inject 26 Units into the skin at  bedtime. What changed:  how much to take   vancomycin 250 MG capsule Commonly known as:  VANCOCIN HCL Take 1 capsule (250 mg total) by mouth 4 (four) times daily for 14 days.   WELCHOL 3.75 g Pack Generic drug:  Colesevelam HCl TAKE 1/2 PACKET TWICE A DAY WITH A MEAL MIXED IN 4-8 OZ OF WATER, JUICE, DIET SOFT DRINK       If you experience worsening of your admission symptoms, develop shortness of breath, life threatening emergency, suicidal or homicidal thoughts you must seek medical attention immediately by calling 911 or calling your MD immediately  if symptoms less severe.  You Must read complete instructions/literature along with all the possible adverse reactions/side effects for all the Medicines you take and that have been prescribed to you. Take any new Medicines after you have completely understood and accept all the possible adverse reactions/side effects.   Please note  You were cared for by a hospitalist during your hospital stay. If you have  any questions about your discharge medications or the care you received while you were in the hospital after you are discharged, you can call the unit and asked to speak with the hospitalist on call if the hospitalist that took care of you is not available. Once you are discharged, your primary care physician will handle any further medical issues. Please note that NO REFILLS for any discharge medications will be authorized once you are discharged, as it is imperative that you return to your primary care physician (or establish a relationship with a primary care physician if you do not have one) for your aftercare needs so that they can reassess your need for medications and monitor your lab values. Today   SUBJECTIVE   Doing well. Diarrhea small amounts No fever tolerating diet  VITAL SIGNS:  Blood pressure 130/68, pulse 68, temperature 98.2 F (36.8 C), temperature source Oral, resp. rate 16, height 5\' 5"  (1.651 m), weight 116.2 kg  (256 lb 3.2 oz), SpO2 94 %.  I/O:  No intake or output data in the 24 hours ending 05/13/18 0901  PHYSICAL EXAMINATION:  GENERAL:  57 y.o.-year-old patient lying in the bed with no acute distress.obese  EYES: Pupils equal, round, reactive to light and accommodation. No scleral icterus. Extraocular muscles intact.  HEENT: Head atraumatic, normocephalic. Oropharynx and nasopharynx clear.  NECK:  Supple, no jugular venous distention. No thyroid enlargement, no tenderness.  LUNGS: Normal breath sounds bilaterally, no wheezing, rales,rhonchi or crepitation. No use of accessory muscles of respiration.  CARDIOVASCULAR: S1, S2 normal. No murmurs, rubs, or gallops.  ABDOMEN: Soft, non-tender, non-distended. Bowel sounds present. No organomegaly or mass.  EXTREMITIES: No pedal edema, cyanosis, or clubbing.  NEUROLOGIC: Cranial nerves II through XII are intact. Muscle strength 5/5 in all extremities. Sensation intact. Gait not checked.  PSYCHIATRIC: The patient is alert and oriented x 3.  SKIN: No obvious rash, lesion, or ulcer.   DATA REVIEW:   CBC  Recent Labs  Lab 05/12/18 0140  WBC 12.7*  HGB 13.9  HCT 40.9  PLT 302    Chemistries  Recent Labs  Lab 05/12/18 0140  NA 137  K 3.5  CL 102  CO2 25  GLUCOSE 183*  BUN 24*  CREATININE 0.86  CALCIUM 9.7  AST 22  ALT 21  ALKPHOS 78  BILITOT 0.5    Microbiology Results   Recent Results (from the past 240 hour(s))  Gastrointestinal Panel by PCR , Stool     Status: None   Collection Time: 05/12/18  2:04 AM  Result Value Ref Range Status   Campylobacter species NOT DETECTED NOT DETECTED Final   Plesimonas shigelloides NOT DETECTED NOT DETECTED Final   Salmonella species NOT DETECTED NOT DETECTED Final   Yersinia enterocolitica NOT DETECTED NOT DETECTED Final   Vibrio species NOT DETECTED NOT DETECTED Final   Vibrio cholerae NOT DETECTED NOT DETECTED Final   Enteroaggregative E coli (EAEC) NOT DETECTED NOT DETECTED Final    Enteropathogenic E coli (EPEC) NOT DETECTED NOT DETECTED Final   Enterotoxigenic E coli (ETEC) NOT DETECTED NOT DETECTED Final   Shiga like toxin producing E coli (STEC) NOT DETECTED NOT DETECTED Final   Shigella/Enteroinvasive E coli (EIEC) NOT DETECTED NOT DETECTED Final   Cryptosporidium NOT DETECTED NOT DETECTED Final   Cyclospora cayetanensis NOT DETECTED NOT DETECTED Final   Entamoeba histolytica NOT DETECTED NOT DETECTED Final   Giardia lamblia NOT DETECTED NOT DETECTED Final   Adenovirus F40/41 NOT DETECTED NOT DETECTED Final  Astrovirus NOT DETECTED NOT DETECTED Final   Norovirus GI/GII NOT DETECTED NOT DETECTED Final   Rotavirus A NOT DETECTED NOT DETECTED Final   Sapovirus (I, II, IV, and V) NOT DETECTED NOT DETECTED Final    Comment: Performed at [facility], [address] [place], [place] [address]  C difficile quick scan w PCR reflex     Status: Abnormal   Collection Time: 05/12/18  2:04 AM  Result Value Ref Range Status   C Diff antigen POSITIVE (A) NEGATIVE Final   C Diff toxin POSITIVE (A) NEGATIVE Final   C Diff interpretation Toxin producing C. difficile detected.  Final    Comment: CRITICAL RESULT CALLED TO, READ BACK BY AND VERIFIED WITH: JENNIFER INGLESOL ON 05/12/18 AT 0402 JAG Performed at [facility], [address] [place], [place] [address]     RADIOLOGY:  No results found.   Management plans discussed with the patient, family and they are in agreement.  CODE STATUS:     Code Status Orders  (From admission, onward)        Start     Ordered   05/12/18 0550  Full code  Continuous     05/12/18 0550    Code Status History    Date Active Date Inactive Code Status Order ID Comments User Context   05/12/2018 0509 05/12/2018 0550 Full Code [id number]  Barbaraann Rondo, MD ED   09/10/2015 1647 09/11/2015 1810 Full Code [id number]  Hilda Lias, MD Inpatient      TOTAL TIME TAKING CARE OF THIS PATIENT: *40* minutes.     Enedina Finner M.D on 05/13/2018 at 9:01 AM  Between 7am to 6pm - Pager - [phone] After 6pm go to [email or web address] - Social research officer, government  [facility]  Office  [phone]  CC: Primary care physician; Kerman Passey, MD

## 2018-05-13 NOTE — Progress Notes (Signed)
Patient discharged home per MD order. All discharge instructions given and all questions answered. 

## 2018-05-13 NOTE — Progress Notes (Addendum)
Debra [name], Debra [name] ([id number]) Visit Report for 05/11/2018 HPI Details Patient Name: Debra, [name]. Date of Service: 05/11/2018 2:00 PM Medical Record Number: [id number] Patient Account Number: [id number] Date of Birth/Sex: 08-May-1961 (57 y.o. 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 She tells me everything was fine up until a week ago. She noticed pain in her left groin area. By Saturday this it started to drain. She applied black salve to this area which she has done in the past and is helped heal problems in this area. This did not work. She was seen in the ER on 03/27/18 with an abscess in the left groin. Noted to have surrounding cellulitis and an indurated area. She underwent an IandD. She was started on patient clindamycin. Her white count was 11.9. Comprehensive metabolic panel normal lactic acid level normal at 1.2. They did not do a culture that I can see. She was seen yesterday at her primary physician's office she may have been put on cephalexin at that point. She is using iodoform packing The patient is a type II diabetic with a history of PAD last hemoglobin A1c of 7.3. She has a history of parkinsonism, COPD, bipolar disorder hypertension. She is a continued 1 pack per day smoker She tells me that she does have a history of recurrent abscesses in this area last about a year ago. Mostly she seems to care for these herself and they seem to go away. 04/06/18; patient's surgical IandD on the lower left abdominal quadrant just above the symphysis pubis appears better. Following is better we've been using silver alginate. Culture I did last week did not grow MRSA rather Enterococcus faecalis.  I substituted Augmentin for the clindamycin however the patient didn't have enough money, however she will pick it up this morning 04/13/18; surgical IandD on the lower left abdominal quadrant just above the symphysis pubis. Surface of this area looks healthy. This is an oval-shaped wound lying horizontally. Medially it has A, all with about 0.9 cm in depth there is no drainage no surrounding erythema. She is completing the Augmentin I gave her last week 04/27/18; surgical IandD site on the lower left abdominal quadrant just above the symphysis pubis. Surface of this wound is improved dimensions are better. We've been using silver alginate and border foam. She tells me she developed C. difficile has been treated with oral vancomycin. Will need to be very judicious with any further antibiotics 05/04/18; surgical IandD site on the lower left abdomen just above the symphysis pubis. All of this is closed over except a small slit like area which is probably where the sinus tract was. There is no drainage no tenderness. 655/19; surgical IandD site on the left lower abdomen just above the symphysis pubis. All of this is closed. There is no open area here. No drainage and no tenderness she does have a history of abscesses but apparently none that have had difficulty healing like this one Electronic Signature(s) Signed: 05/11/2018 6:13:14 PM By: Baltazar Najjar MD Entered By: Baltazar Najjar on 05/11/2018 17:47:36 Debra [name] ([id number]) -------------------------------------------------------------------------------- Physical Exam Details Patient Name: Debra [name] Date of Service: 05/11/2018 2:00 PM Medical Record Number: [id number] Patient Account Number: [id number] Date of Birth/Sex: 1961/10/19 (57 y.o. F) Treating  RN: Huel CoventryWoody, Deepa Primary Care Provider: Baruch GoutyLada, Melinda Other Clinician: Referring Provider: Baruch GoutyLada, Melinda Treating Provider/Extender: Altamese CarolinaOBSON, Oshay Stranahan G [place] in Treatment:  6 Constitutional Sitting or standing Blood Pressure is within target range for patient.. Pulse regular and within target range for patient.[name]. Respirations regular, non-labored and within target range.. Temperature is normal and within the target range for the patient.[name]. appears in no distress. Notes wound exam; left lower abdominal quadrant just above her symphysis pews at pubis. All of this area is completely epithelialized there is no open area here no tenderness and no drainage Electronic Signature(s) Signed: 05/11/2018 6:13:14 PM By: Baltazar Najjarobson, Clarity Ciszek MD Entered By: Baltazar Najjarobson, Felton Buczynski on 05/11/2018 17:48:40 Debra BowieKANE, Faith A. ([id number]) -------------------------------------------------------------------------------- Physician Orders Details Patient Name: Debra BowieKANE, Aneliese A. Date of Service: 05/11/2018 2:00 PM Medical Record Number: [id number] Patient Account Number: [id number] Date of Birth/Sex: 1961-02-21 (57 y.o. F) Treating RN: Huel CoventryWoody, Masiah Primary Care Provider: Baruch GoutyLada, Melinda Other Clinician: Referring Provider: Baruch GoutyLada, Melinda Treating Provider/Extender: Altamese CarolinaOBSON, Trany Chernick G We[place] in Treatment: 6 Verbal / Phone Orders: No Diagnosis Coding ICD-10 Coding Code Description L02.211 Cutaneous abscess of abdominal wall T81.31XD Disruption of external operation (surgical) wound, not elsewhere classified, subsequent encounter [facility] from [facility] - treatment complete Electronic Signature(s) Signed: 05/12/2018 11:57:04 AM By: Elliot GurneyWoody, BSN, RN, CWS, Latrica RN, BSN Signed: 05/18/2018 4:49:36 PM By: Baltazar Najjarobson, Sherronda Sweigert MD Entered By: Elliot GurneyWoody, BSN, RN, CWS, Shaliyah on 05/12/2018 11:57:04 Debra BowieKANE, Larri A. ([id number]) -------------------------------------------------------------------------------- Problem List Details Patient Name: Debra BowieKANE, Moya A. Date of Service: 05/11/2018 2:00 PM Medical Record Number: [id number] Patient Account Number: [id number] Date of Birth/Sex: 1961-02-21 (57 y.o.  F) Treating RN: Huel CoventryWoody, Sydell Primary Care Provider: Baruch GoutyLada, Melinda Other Clinician: Referring Provider: Baruch GoutyLada, Melinda Treating Provider/Extender: Altamese CarolinaOBSON, Terrisa Curfman G We[place] in Treatment: 6 Active Problems ICD-10 Impacting Encounter Code Description Active Date Wound Healing Diagnosis L02.211 Cutaneous abscess of abdominal wall 03/30/2018 No Yes T81.31XD Disruption of external operation (surgical) wound, not 03/30/2018 No Yes elsewhere classified, subsequent encounter Inactive Problems Resolved Problems Electronic Signature(s) Signed: 05/11/2018 6:13:14 PM By: Baltazar Najjarobson, Kyley Solow MD Entered By: Baltazar Najjarobson, Samanda Buske on 05/11/2018 17:43:07 Debra BowieKANE, Agnieszka A. ([id number]) -------------------------------------------------------------------------------- Progress Note Details Patient Name: Debra BowieKANE, Sharice A. Date of Service: 05/11/2018 2:00 PM Medical Record Number: [id number] Patient Account Number: [id number] Date of Birth/Sex: 1961-02-21 (57 y.o. 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 She tells me everything was fine up until a week ago. She noticed pain in her left groin area. By Saturday this it started to drain. She applied black salve to this area which she has done in the past and is helped heal problems in this area. This did not work. She was seen in the ER on 03/27/18 with an abscess in the left groin. Noted to have surrounding cellulitis and an indurated area. She underwent an IandD. She was started on patient clindamycin. Her white count was 11.9. Comprehensive metabolic panel normal lactic acid level normal at 1.2. They did not do a culture that I can see. She was seen yesterday at her primary physician's  office she may have been put on cephalexin at that point. She is using iodoform packing The patient is a type II diabetic with a history of PAD last hemoglobin A1c of 7.3.  She has a history of parkinsonism, COPD, bipolar disorder hypertension. She is a continued 1 pack per day smoker She tells me that she does have a history of recurrent abscesses in this area last about a year ago. Mostly she seems to care for these herself and they seem to go away. 04/06/18; patient's surgical IandD on the lower left abdominal quadrant just above the symphysis pubis appears better. Following is better we've been using silver alginate. Culture I did last week did not grow MRSA rather Enterococcus faecalis. I substituted Augmentin for the clindamycin however the patient didn't have enough money, however she will pick it up this morning 04/13/18; surgical IandD on the lower left abdominal quadrant just above the symphysis pubis. Surface of this area looks healthy. This is an oval-shaped wound lying horizontally. Medially it has A, all with about 0.9 cm in depth there is no drainage no surrounding erythema. She is completing the Augmentin I gave her last week 04/27/18; surgical IandD site on the lower left abdominal quadrant just above the symphysis pubis. Surface of this wound is improved dimensions are better. We've been using silver alginate and border foam. She tells me she developed C. difficile has been treated with oral vancomycin. Will need to be very judicious with any further antibiotics 05/04/18; surgical IandD site on the lower left abdomen just above the symphysis pubis. All of this is closed over except a small slit like area which is probably where the sinus tract was. There is no drainage no tenderness. 655/19; surgical IandD site on the left lower abdomen just above the symphysis pubis. All of this is closed. There is no open area here. No drainage and no tenderness she does have a history of abscesses but  apparently none that have had difficulty healing like this one Objective Constitutional Sitting or standing Blood Pressure is within target range for patient.. Pulse regular and within target range for patient.[name] Respirations regular, non-labored and within target range.. Temperature is normal and within the target range for the patient.[name] appears in no distress. Vitals Time Taken: 1:39 PM, Height: 65 in, Weight: 230 lbs, BMI: 38.3, Temperature: 98.1 F, Pulse: 70 bpm, Respiratory Rate: 18 breaths/min, Blood Pressure: 127/57 mmHg. Debra [name] ([id number]) General Notes: wound exam; left lower abdominal quadrant just above her symphysis pews at pubis. All of this area is completely epithelialized there is no open area here no tenderness and no drainage Integumentary (Hair, Skin) Wound #1 status is Healed - Epithelialized. Original cause of wound was Gradually Appeared. The wound is located on the Left Groin. The wound measures 0cm length x 0cm width x 0cm depth; 0cm^2 area and 0cm^3 volume. There is Fat Layer (Subcutaneous Tissue) Exposed exposed. There is no tunneling or undermining noted. There is a none present amount of drainage noted. The wound margin is distinct with the outline attached to the wound base. There is no granulation within the wound bed. There is no necrotic tissue within the wound bed. The periwound skin appearance exhibited: Maceration, Ecchymosis. The periwound skin appearance did not exhibit: Callus, Crepitus, Excoriation, Induration, Rash, Scarring, Dry/Scaly, Atrophie Blanche, Cyanosis, Hemosiderin Staining, Mottled, Pallor, Rubor, Erythema. Periwound temperature was noted as Hot. The periwound has tenderness on palpation. Assessment Active Problems ICD-10 Cutaneous abscess of abdominal wall Disruption of external operation (surgical) wound, not elsewhere classified, subsequent encounter Plan #1 the patient can be discharged from the clinic #2 the abdominal wall  abscess that was initially a surgical IandD site is closed Electronic  Signature(s) Signed: 05/11/2018 6:13:14 PM By: Baltazar Najjar MD Entered By: Baltazar Najjar on 05/11/2018 17:49:20 Debra [name] ([id number]) -------------------------------------------------------------------------------- SuperBill Details Patient Name: Debra [name] Date of Service: 05/11/2018 Medical Record Number: [id number] Patient Account Number: [id number] Date of Birth/Sex: 1961-03-05 (57 y.o. F) Treating RN: Huel Coventry Primary Care Provider: Baruch Gouty Other Clinician: Referring Provider: Baruch Gouty Treating Provider/Extender: Altamese [place] in Treatment: 6 Diagnosis Coding ICD-10 Codes Code Description L02.211 Cutaneous abscess of abdominal wall T81.31XD Disruption of external operation (surgical) wound, not elsewhere classified, subsequent encounter Facility Procedures CPT4 Code: [id number] Description: [phone] - WOUND CARE VISIT-LEV 2 EST PT Modifier: Quantity: 1 Physician Procedures CPT4: Description Modifier Quantity Code [id number] - WC PHYS LEVEL 2 - EST PT 1 ICD-10 Diagnosis Description L02.211 Cutaneous abscess of abdominal wall T81.31XD Disruption of external operation (surgical) wound, not elsewhere classified, subsequent  encounter Electronic Signature(s) Signed: 05/12/2018 11:59:04 AM By: Elliot Gurney, BSN, RN, CWS, Tarri RN, BSN Signed: 05/18/2018 4:49:36 PM By: Baltazar Najjar MD Previous Signature: 05/11/2018 6:13:14 PM Version By: Baltazar Najjar MD Entered By: Elliot Gurney, BSN, RN, CWS, Emilija on 05/12/2018 11:59:04

## 2018-05-16 ENCOUNTER — Telehealth: Payer: Self-pay

## 2018-05-16 ENCOUNTER — Encounter: Payer: Self-pay | Admitting: Family Medicine

## 2018-05-16 NOTE — Telephone Encounter (Signed)
TOC #1. Called pt to f/u after [facility] on 05/13/18. Also wanted to confirm his/her hosp f/u appt w/ Dr. Sherie DonLada on 05/18/18 @ 11:20am. Discharge planning includes the following:  - f/u with Dr. Sherie DonLada on 05/18/18 - f/u with GI on 05/30/18 - continue with Vanco po until seen by GI as OP - continue diabetic diet  LVM requesting returned call.

## 2018-05-17 IMAGING — CT CT HEAD CODE STROKE
3 series · 15 of 47 positions shown, 18 images · non-contrast
Comparison: Head CT 09/07/2016

CLINICAL DATA: Code stroke.  Right facial droop

EXAM:
CT HEAD WITHOUT CONTRAST
TECHNIQUE: Contiguous axial images were obtained from the base of the skull
through the vertex without intravenous contrast.

[Series 2: head 5.0 st · axial · 0.39mm/px · z∈[-135,-5]mm · 9 of 32 slices shown, 12 images]
[im 3/32  brain]
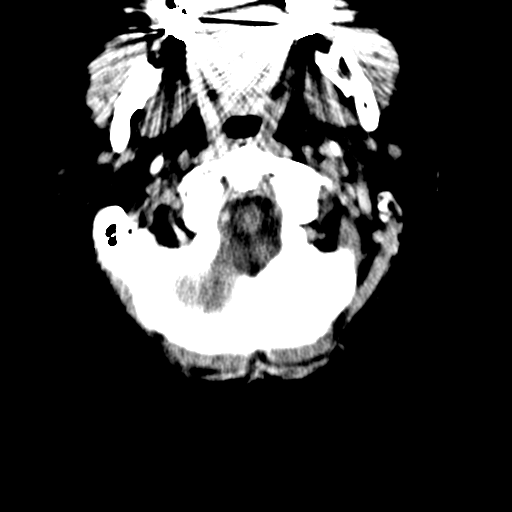
[im 3/32  bone]
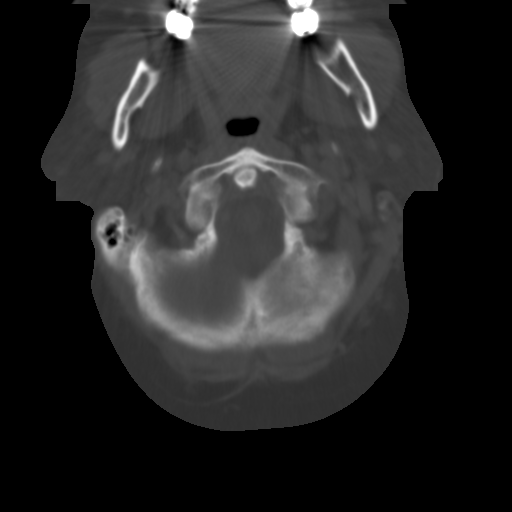
[im 6/32  brain]
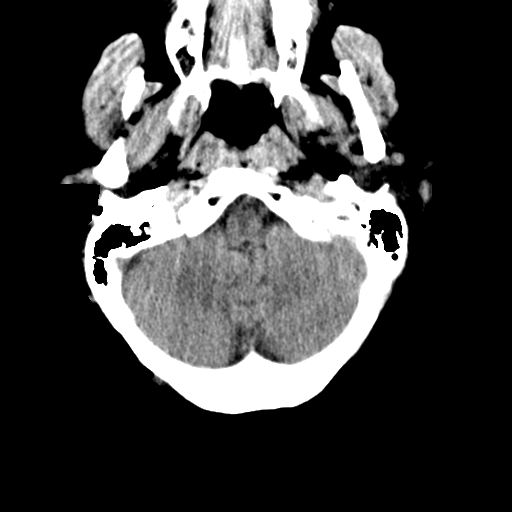
[im 9/32  brain]
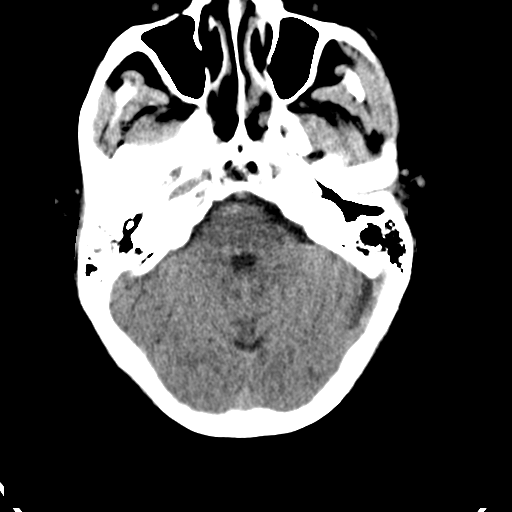
[im 12/32  brain]
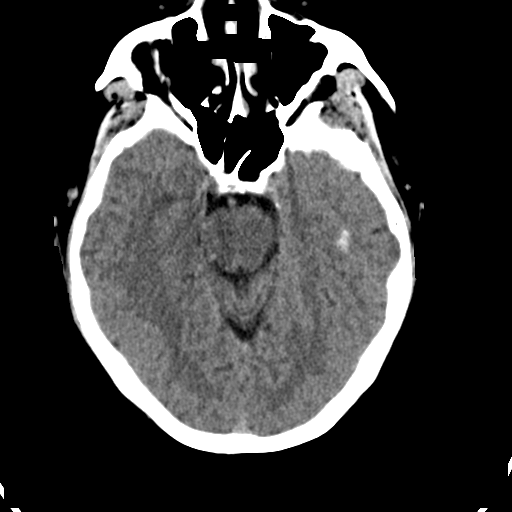
[im 17/32  brain]
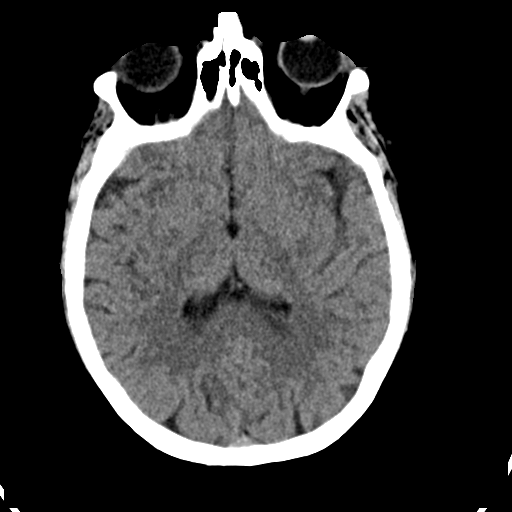
[im 17/32  bone]
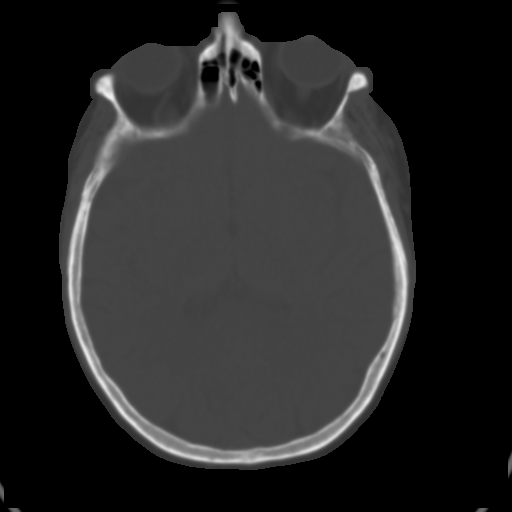
[im 20/32  brain]
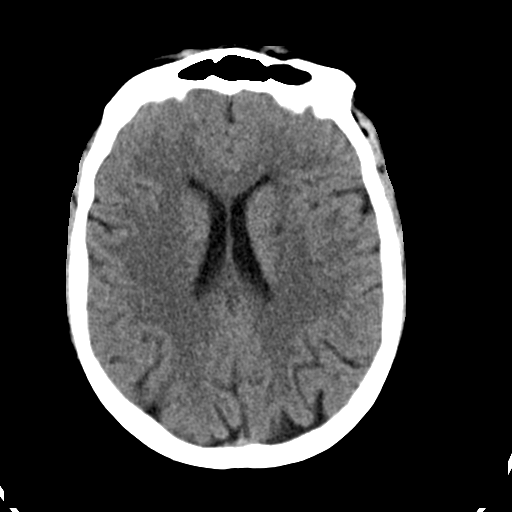
[im 23/32  brain]
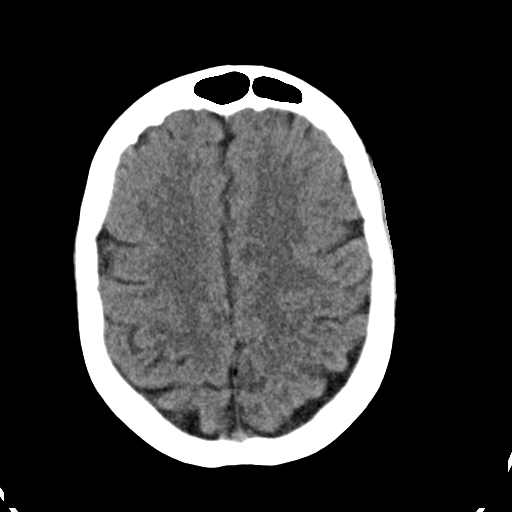
[im 26/32  brain]
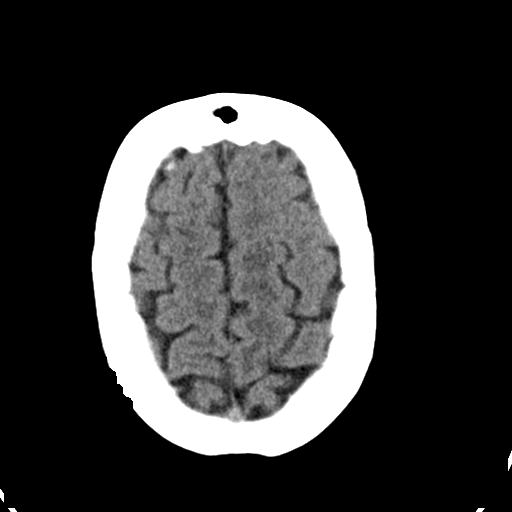
[im 29/32  brain]
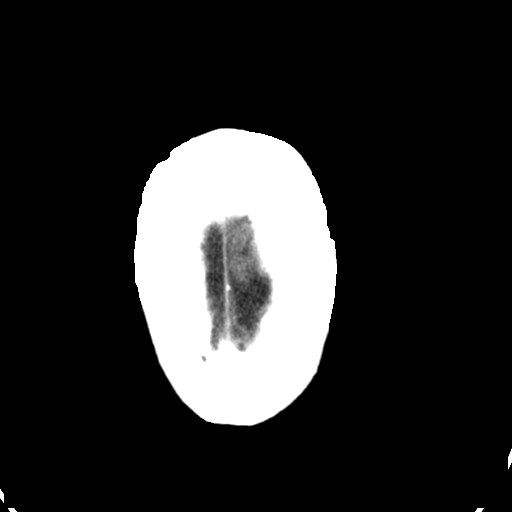
[im 29/32  bone]
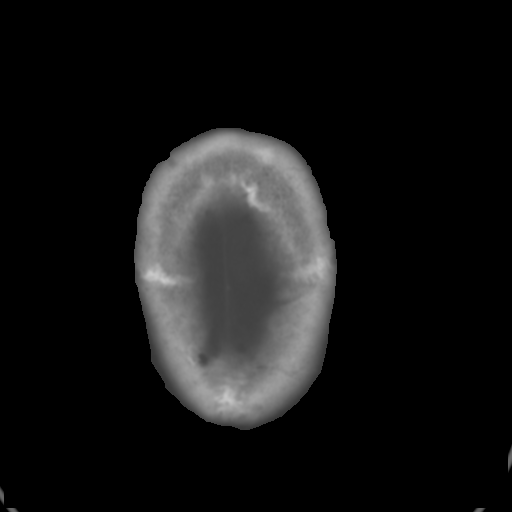

[Series 4: head 3.0 cor st · coronal · 0.31mm/px · 3 of 64 slices shown]
[im 22/64  brain]
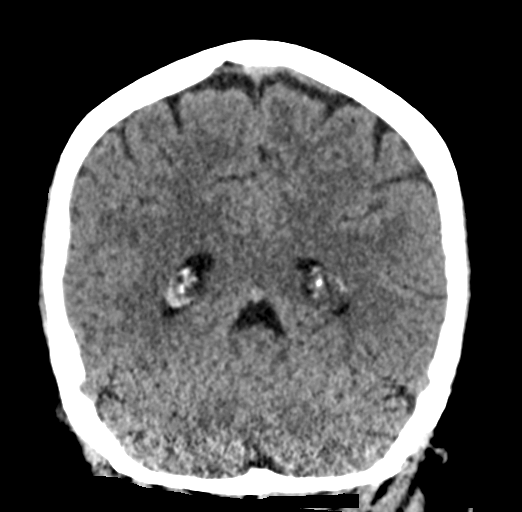
[im 29/64  brain]
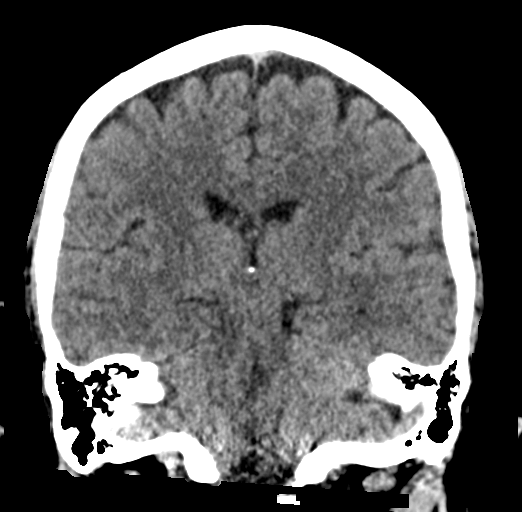
[im 36/64  brain]
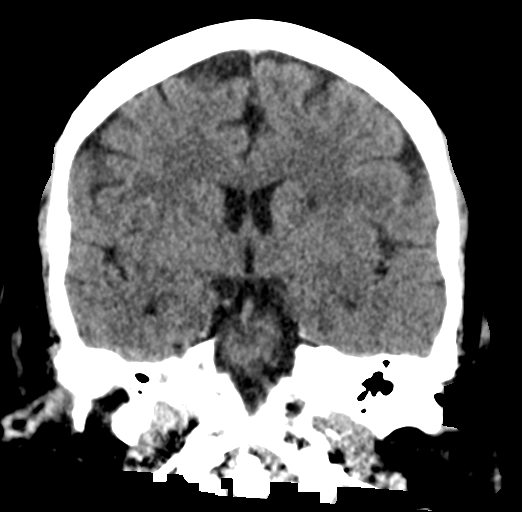

[Series 5: head 3.0 sag st · sagittal · 0.31mm/px · 3 of 55 slices shown]
[im 19/55  brain]
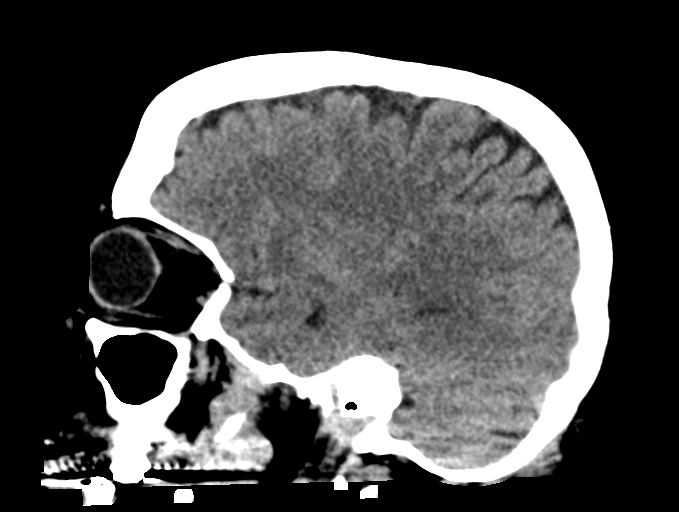
[im 28/55  brain]
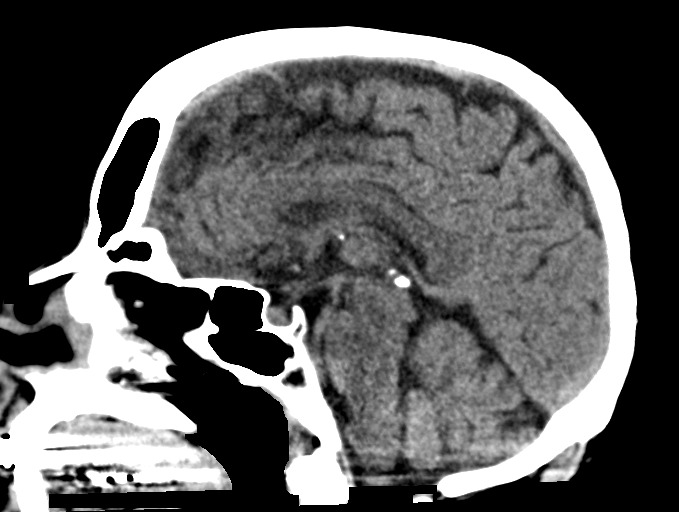
[im 37/55  brain]
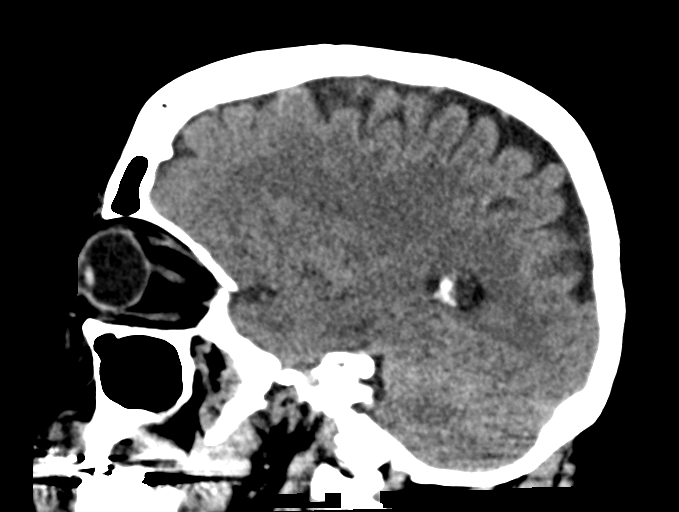

[15 of 47 positions shown; findings below may reference images not displayed]

FINDINGS: Brain: There is no intraparenchymal hemorrhage, extra-axial
collection or mass lesion. Old lacunar infarct in the left basal
ganglia is unchanged. No CT evidence of acute cortical infarct.
Gray-white differentiation, the basal ganglia and insular cortices
are maintained. There is no age advanced or lobar predominant
atrophy.

Vascular: No hyperdense vessel or unexpected calcification.

Skull: Normal. Negative for fracture or focal lesion.

Sinuses/Orbits: The visualized portions of the paranasal sinuses and
mastoid air cells are free of fluid. No advanced mucosal thickening.
The visualized orbits are normal.

Other: None

ASPECTS (Alberta Stroke Program Early CT Score)

- Ganglionic level infarction (caudate, lentiform nuclei, internal
capsule, insula, M1-M3 cortex): 7

- Supraganglionic infarction (M4-M6 cortex): 3

Total score (0-10 with 10 being normal): 10
IMPRESSION: 1. No acute intracranial abnormality.
2. ASPECTS is 10.
3. Unchanged appearance of chronic left basal ganglia lacunar
infarct.

These results were called by telephone at the time of interpretation
on 10/21/2016 at [DATE] to Dr. MERVAT LANDERS , who verbally
acknowledged these results.

## 2018-05-18 ENCOUNTER — Encounter: Payer: Self-pay | Admitting: Family Medicine

## 2018-05-18 ENCOUNTER — Ambulatory Visit (INDEPENDENT_AMBULATORY_CARE_PROVIDER_SITE_OTHER): Payer: Managed Care, Other (non HMO) | Admitting: Family Medicine

## 2018-05-18 VITALS — BP 136/80 | HR 76 | Temp 98.1°F | Resp 16 | Ht 65.0 in | Wt 254.9 lb

## 2018-05-18 DIAGNOSIS — E119 Type 2 diabetes mellitus without complications: Secondary | ICD-10-CM | POA: Diagnosis not present

## 2018-05-18 DIAGNOSIS — E1165 Type 2 diabetes mellitus with hyperglycemia: Secondary | ICD-10-CM

## 2018-05-18 DIAGNOSIS — Z794 Long term (current) use of insulin: Secondary | ICD-10-CM

## 2018-05-18 DIAGNOSIS — Z09 Encounter for follow-up examination after completed treatment for conditions other than malignant neoplasm: Secondary | ICD-10-CM | POA: Diagnosis not present

## 2018-05-18 DIAGNOSIS — E1151 Type 2 diabetes mellitus with diabetic peripheral angiopathy without gangrene: Secondary | ICD-10-CM | POA: Diagnosis not present

## 2018-05-18 DIAGNOSIS — E0865 Diabetes mellitus due to underlying condition with hyperglycemia: Secondary | ICD-10-CM

## 2018-05-18 DIAGNOSIS — A0472 Enterocolitis due to Clostridium difficile, not specified as recurrent: Secondary | ICD-10-CM

## 2018-05-18 DIAGNOSIS — I70209 Unspecified atherosclerosis of native arteries of extremities, unspecified extremity: Secondary | ICD-10-CM | POA: Diagnosis not present

## 2018-05-18 DIAGNOSIS — IMO0002 Reserved for concepts with insufficient information to code with codable children: Secondary | ICD-10-CM

## 2018-05-18 LAB — POCT GLYCOSYLATED HEMOGLOBIN (HGB A1C): HEMOGLOBIN A1C: 7.6 % — AB (ref 4.0–5.6)

## 2018-05-18 MED ORDER — PIOGLITAZONE HCL 30 MG PO TABS: 30.0000 mg | ORAL_TABLET | Freq: Every day | ORAL | 3 refills | Status: DC

## 2018-05-18 MED ORDER — BLOOD GLUCOSE METER KIT: PACK | 0 refills | Status: DC

## 2018-05-18 MED ORDER — GLUCOSE BLOOD VI STRP
ORAL_STRIP | 12 refills | Status: DC
Start: 2018-05-18 — End: 2018-07-19

## 2018-05-18 NOTE — Progress Notes (Signed)
BP 136/80   Pulse 76   Temp 98.1 F (36.7 C) (Oral)   Resp 16   Ht _0  (1.651 m)   Wt 254 lb 14.4 oz (115.6 kg)   SpO2 98%   BMI 42.42 kg/m    Subjective:    Patient ID: Debra [name], female    DOB: 10-Oct-1961, 57 y.o.   MRN: [id number]  HPI: Debra [name] is a 57 y.o. female  Chief Complaint  Patient presents with  . Hospitalization Follow-up    C diff  . Follow-up    HPI Patient is here for f/u after hospitalization for c.diff and discharged on 05/13/2018. Patient was diagnosed with cdiff originally mid march and started on vancomycin; last seen in office 5/17 with some improvement of symptoms and stated she was taking medications as prescribed from 5/14-5/28. After medications stopped diarrhea gradually increased. Pt received IV fluids, encouraged to take yogurt in diet and continuing vancomycin QID until seen in GI clinic outpatient.  Syncopal episodes on Thursday before going to hospital and twice in the car, so very dehydrated; going on badly since that Monday before  [facility] phone call made by Ammie on 05/16/2018.  Since discharge patient has been doing better. States less BMs, more firm, still gassy. Maybe 1-2 BMs a day. Taking vanc 4 times daily. 6/24 appointment with GI doctors. Drinking plenty of gatorade and water.   Medicine is $$$$$, but insurance covered  Stopped humalog in the hospital, but patient is still taking it; sugars were really good in the hospital, but not eating and not like home; patient is still taking  High cholesterol; last LDL dropped from 172 to 122  Depression screen [facility] 2/9 05/18/2018 04/22/2018 03/29/2018 03/04/2018 02/15/2018  Decreased Interest 0 0 0 0 0  Down, Depressed, Hopeless 2 0 0 0 0  PHQ - 2 Score 2 0 0 0 0  Altered sleeping 3 - - - -  Tired, decreased energy 3 - - - -  Change in appetite 2 - - - -  Feeling bad or failure about yourself  0 - - - -  Trouble concentrating 1 - - - -  Moving slowly or fidgety/restless 0 - - - -  Suicidal  thoughts 0 - - - -  PHQ-9 Score 11 - - - -  Difficult doing work/chores Somewhat difficult - - - -    Relevant past medical, surgical, family and social history reviewed Past Medical History:  Diagnosis Date  . Anxiety   . Arthritis   . C. difficile colitis   . C. difficile colitis   . Depression   . Diabetes mellitus without complication ([place])    Type II  . Family history of adverse reaction to anesthesia    Mother - BP drops  . Head injury, closed, with brief LOC ([place]) 2011ish  . Hyperlipidemia   . Hypertension   . Insomnia   . Myalgia 03/29/2018   Due to statin  . Obesity   . Parkinson's disease ([place])   . Seasonal allergies   . Tobacco abuse    Past Surgical History:  Procedure Laterality Date  . ABDOMINAL HYSTERECTOMY  2012  . BREAST BIOPSY Right   . CESAREAN SECTION  1992  . CHOLECYSTECTOMY  2013  . LUMBAR LAMINECTOMY/DECOMPRESSION MICRODISCECTOMY Left 09/10/2015   Procedure: Left L4-5 Foraminotomy/Left L5-S1 Diskectomy;  Surgeon: Leeroy Cha, MD;  Location: [facility];  Service: Neurosurgery;  Laterality: Left;  Left L4-5 Foraminotomy/Left L5-S1 Diskectomy  Family History  Problem Relation Age of Onset  . COPD Mother   . Bipolar disorder Father   . Diabetes Mellitus II Father   . Bipolar disorder Brother   . Diabetes Mellitus II Brother   . Bipolar disorder Brother    Social History   Tobacco Use  . Smoking status: Current Every Day Smoker    Packs/day: 0.50    Years: 24.00    Pack years: 12.00    Types: Cigarettes  . Smokeless tobacco: Never Used  Substance Use Topics  . Alcohol use: Yes    Alcohol/week: 0.0 oz    Comment: occasional- rare  . Drug use: No    Interim medical history since last visit reviewed. Allergies and medications reviewed  Review of Systems Per HPI unless specifically indicated above     Objective:    BP 136/80   Pulse 76   Temp 98.1 F (36.7 C) (Oral)   Resp 16   Ht _0  (1.651 m)   Wt 254 lb 14.4 oz (115.6  kg)   SpO2 98%   BMI 42.42 kg/m   Wt Readings from Last 3 Encounters:  05/18/18 254 lb 14.4 oz (115.6 kg)  05/13/18 256 lb 3.2 oz (116.2 kg)  04/22/18 251 lb 3.2 oz (113.9 kg)    Physical Exam  Constitutional: She appears well-developed and well-nourished. No distress.  HENT:  Head: Normocephalic and atraumatic.  Eyes: EOM are normal. No scleral icterus.  Neck: No thyromegaly present.  Cardiovascular: Normal rate, regular rhythm and normal heart sounds.  No murmur heard. Pulmonary/Chest: Effort normal and breath sounds normal. No respiratory distress. She has no wheezes.  Abdominal: Soft. Bowel sounds are normal. She exhibits no distension.  Musculoskeletal: She exhibits no edema.  Neurological: She is alert. She exhibits normal muscle tone.  Skin: Skin is warm and dry. She is not diaphoretic. No pallor.  Psychiatric: She has a normal mood and affect. Her behavior is normal. Judgment and thought content normal.   Diabetic Foot Form - Detailed   Diabetic Foot Exam - detailed Diabetic Foot exam was performed with the following findings:  Yes 05/18/2018  4:28 PM  Visual Foot Exam completed.:  Yes  Pulse Foot Exam completed.:  Yes  Sensory Foot Exam Completed.:  Yes Semmes-Weinstein Monofilament Test       Results for orders placed or performed in visit on 05/18/18  POCT HgB A1C  Result Value Ref Range   Hemoglobin A1C 7.6 (A) 4.0 - 5.6 %   HbA1c, POC (prediabetic range)  5.7 - 6.4 %   HbA1c, POC (controlled diabetic range)  0.0 - 7.0 %      Assessment & Plan:   Problem List Items Addressed This Visit      Cardiovascular and Mediastinum   Uncontrolled diabetes mellitus type 2 with atherosclerosis of arteries of extremities (HCC)   Relevant Medications   HUMALOG KWIKPEN 100 UNIT/ML KiwkPen   pioglitazone (ACTOS) 30 MG tablet   Other Relevant Orders   POCT HgB A1C (Completed)     Digestive   C. difficile colitis    Continue vanc, f/u with GI        Endocrine    Uncontrolled diabetes mellitus ([place])   Relevant Medications   HUMALOG KWIKPEN 100 UNIT/ML KiwkPen   glucose blood test strip   pioglitazone (ACTOS) 30 MG tablet   Other Relevant Orders   Urine Microalbumin w/creat. ratio     Other   Morbid obesity ([place])  Relevant Medications   HUMALOG KWIKPEN 100 UNIT/ML KiwkPen   pioglitazone (ACTOS) 30 MG tablet    Other Visit Diagnoses    Hospital discharge follow-up    -  Primary   patient seen within seven days of hospital discharge; meds reconciled   Type 2 diabetes mellitus treated with insulin (HCC)       Relevant Medications   HUMALOG KWIKPEN 100 UNIT/ML KiwkPen   glucose blood test strip   pioglitazone (ACTOS) 30 MG tablet   Other Relevant Orders   Urine Microalbumin w/creat. ratio       Follow up plan: Return in about 3 months (around 08/18/2018) for follow-up visit with Dr. Sanda Klein.  An after-visit summary was printed and given to the patient at [facility].  Please see the patient instructions which may contain other information and recommendations beyond what is mentioned above in the assessment and plan.  Meds ordered this encounter  Medications  . glucose blood test strip    Sig: Test blood sugars 4x a day; on insulin; uncontrolled, fluctuating blood sugars; E11.65, LON 99 months    Dispense:  100 each    Refill:  12  . blood glucose meter kit and supplies    Sig: Use up to four times daily as directed, Dx E11.65, LON 99 months    Dispense:  1 each    Refill:  0    Order Specific Question:   Number of strips    Answer:   100    Order Specific Question:   Number of lancets    Answer:   100  . pioglitazone (ACTOS) 30 MG tablet    Sig: Take 1 tablet (30 mg total) by mouth daily.    Dispense:  90 tablet    Refill:  3    We are increasing the dose, cancel the 15 mg refills    Orders Placed This Encounter  Procedures  . Urine Microalbumin w/creat. ratio  . POCT HgB A1C

## 2018-05-18 NOTE — Patient Instructions (Addendum)
Please do eat yogurt daily or take a probiotic daily for the next month or two. We want to replace the healthy germs in the gut.   Check out the information at familydoctor.org entitled "Nutrition for Weight Loss: What You Need to Know about Fad Diets" Try to lose between 1-2 pounds per week by taking in fewer calories and burning off more calories You can succeed by limiting portions, limiting foods dense in calories and fat, becoming more active, and drinking 8 glasses of water a day (64 ounces) Don't skip meals, especially breakfast, as skipping meals may alter your metabolism Do not use over-the-counter weight loss pills or gimmicks that claim rapid weight loss A healthy BMI (or body mass index) is between 18.5 and 24.9 You can calculate your ideal BMI at the NIH website [employer].huhttp://www.nhlbi.nih.gov/health/educational/lose_wt/BMI/bmicalc.htm

## 2018-05-19 DIAGNOSIS — G4752 REM sleep behavior disorder: Secondary | ICD-10-CM | POA: Insufficient documentation

## 2018-05-20 ENCOUNTER — Other Ambulatory Visit: Payer: Self-pay

## 2018-05-20 LAB — GLUCOSE, CAPILLARY
GLUCOSE-CAPILLARY: 90 mg/dL (ref 65–99)
GLUCOSE-CAPILLARY: 143 mg/dL — AB (ref 65–99)

## 2018-05-29 NOTE — Assessment & Plan Note (Signed)
Continue vanc, f/u with GI

## 2018-05-30 ENCOUNTER — Ambulatory Visit (INDEPENDENT_AMBULATORY_CARE_PROVIDER_SITE_OTHER): Payer: Managed Care, Other (non HMO) | Admitting: Gastroenterology

## 2018-05-30 ENCOUNTER — Encounter: Payer: Self-pay | Admitting: Gastroenterology

## 2018-05-30 VITALS — BP 121/75 | HR 77 | Ht 65.0 in | Wt 252.0 lb

## 2018-05-30 DIAGNOSIS — A0472 Enterocolitis due to Clostridium difficile, not specified as recurrent: Secondary | ICD-10-CM | POA: Diagnosis not present

## 2018-05-30 DIAGNOSIS — E1151 Type 2 diabetes mellitus with diabetic peripheral angiopathy without gangrene: Secondary | ICD-10-CM

## 2018-05-30 DIAGNOSIS — I70209 Unspecified atherosclerosis of native arteries of extremities, unspecified extremity: Secondary | ICD-10-CM

## 2018-05-30 DIAGNOSIS — E1165 Type 2 diabetes mellitus with hyperglycemia: Secondary | ICD-10-CM

## 2018-05-30 MED ORDER — VANCOMYCIN HCL 125 MG PO CAPS: ORAL_CAPSULE | ORAL | 0 refills | Status: AC

## 2018-05-30 NOTE — Addendum Note (Signed)
Addended by: Ardyth ManARTER, Islah Eve Z on: 05/30/2018 02:08 PM   Modules accepted: Orders

## 2018-05-30 NOTE — Progress Notes (Signed)
[name] MD, MRCP(U.K) [place]  [place], [place] [address]  Main: [phone]  Fax: [phone]   Primary Care Physician: Arnetha Courser, MD  Primary Gastroenterologist:  Dr. [name]   No chief complaint on file.   HPI: Debra [name] is a 57 y.o. female   Summary of history :  She is here today to see to see me as a hospital follow-up.  I was consulted to see her on 05/12/2018 when she was admitted with C. difficile colitis.  She was initially diagnosed with C. difficile colitis in May 2019 treated with vancomycin.  The diarrhea began in May 2019 after she was treated for an abdominal cyst with clindamycin.  While on vancomycin the diarrhea almost resolved but once she ran out of her course the diarrhea recurred.  On admission she had 10-15 bowel movements per day.  On admission after starting her vancomycin she felt much better.  Interval history   05/26/2018 - 05/30/2018 She has 1 formed bowel movement a day . No pain. Doing well  Current Outpatient Medications  Medication Sig Dispense Refill  . aspirin EC 81 MG tablet Take 81 mg by mouth daily.    . blood glucose meter kit and supplies Use up to four times daily as directed, Dx E11.65, LON 99 months 1 each 0  . calcium citrate-vitamin D (CITRACAL+D) 315-200 MG-UNIT tablet Take 1 tablet by mouth 2 (two) times daily.    . Cholecalciferol 5000 units capsule Take 5,000 Units by mouth.     . ezetimibe (ZETIA) 10 MG tablet     . folic acid (FOLVITE) 462 MCG tablet Take 800 mcg by mouth daily.     [name] glucose blood test strip Test blood sugars 4x a day; on insulin; uncontrolled, fluctuating blood sugars; E11.65, LON 99 months 100 each 12  . HUMALOG KWIKPEN 100 UNIT/ML KiwkPen Give subcutaneously with meals, 20 units in the AM and 20 units with evening meal    . Insulin Pen Needle (BD ULTRA-FINE PEN NEEDLES) 29G X 12.7MM MISC USE THREE TIMES DAILY AS DIRECTED    . lisinopril (PRINIVIL,ZESTRIL) 40 MG tablet Take  0.5 tablets (20 mg total) by mouth daily. 90 tablet 0  . Magnesium 400 MG CAPS Take 250 mg by mouth daily.     . Melatonin 10 MG TABS Take 1 tablet by mouth at bedtime.    . Multiple Vitamins-Minerals (CENTRUM WOMEN) TABS Take 1 tablet by mouth.    Glory Rosebush DELICA LANCETS 70J MISC     . PARoxetine (PAXIL) 20 MG tablet Take 1 tablet (20 mg total) by mouth daily. 90 tablet 1  . pioglitazone (ACTOS) 30 MG tablet Take 1 tablet (30 mg total) by mouth daily. 90 tablet 3  . propranolol ER (INDERAL LA) 80 MG 24 hr capsule TAKE 1 CAPSULE (80 MG TOTAL) BY MOUTH ONCE DAILY  3  . TRESIBA FLEXTOUCH 200 UNIT/ML SOPN Inject 26 Units into the skin at bedtime.    . vancomycin (VANCOCIN) 125 MG capsule TK 2 CS PO QID FOR 14 DAYS  0   No current facility-administered medications for this visit.     Allergies as of 05/30/2018 - Review Complete 05/18/2018  Allergen Reaction Noted  . Metformin and related Diarrhea 07/22/2015  . Benadryl [diphenhydramine hcl] Swelling 05/22/2011  . Canagliflozin Other (See Comments) 05/06/2016  . Depakote er [divalproex sodium er] Other (See Comments) 05/29/2015  . Dilaudid [hydromorphone hcl] Other (See Comments) 05/29/2015  .  Diphenhydramine-zinc acetate Other (See Comments) 12/11/2015  . Haloperidol Other (See Comments) 12/11/2015  . Hydromorphone Other (See Comments) 12/11/2015  . Neosporin [neomycin-bacitracin zn-polymyx] Swelling 05/29/2015  . Singulair [montelukast sodium] Other (See Comments) 05/29/2015  . Sitagliptin Other (See Comments) 12/11/2015  . Statins Other (See Comments) 02/23/2014  . Sulfa antibiotics Hives 02/23/2014  . Victoza [liraglutide] Other (See Comments) and Nausea And Vomiting 02/23/2014    ROS:  General: Negative for anorexia, weight loss, fever, chills, fatigue, weakness. ENT: Negative for hoarseness, difficulty swallowing , nasal congestion. CV: Negative for chest pain, angina, palpitations, dyspnea on exertion, peripheral edema.    Respiratory: Negative for dyspnea at rest, dyspnea on exertion, cough, sputum, wheezing.  GI: See history of present illness. GU:  Negative for dysuria, hematuria, urinary incontinence, urinary frequency, nocturnal urination.  Endo: Negative for unusual weight change.    Physical Examination:   There were no vitals taken for this visit.  General: Well-nourished, well-developed in no acute distress.  Eyes: No icterus. Conjunctivae pink. Mouth: Oropharyngeal mucosa moist and pink , no lesions erythema or exudate. Lungs: Clear to auscultation bilaterally. Non-labored. Heart: Regular rate and rhythm, no murmurs rubs or gallops.  Abdomen: Bowel sounds are normal, nontender, nondistended, no hepatosplenomegaly or masses, no abdominal bruits or hernia , no rebound or guarding.   Extremities: No lower extremity edema. No clubbing or deformities. Neuro: Alert and oriented x 3.  Grossly intact. Skin: Warm and dry, no jaundice.   Psych: Alert and cooperative, normal mood and affect.   Imaging Studies: No results found.  Assessment and Plan:   Debra [name] is a 57 y.o. y/o female here to see me today as a hospital follow-up for the first recurrence of C. difficile colitis.  Discharged on 05/12/2018 with vancomycin 125 mg 4 times a day.  Plan at this time will be to gradually taper  vancomycin over a period of weeks.  If the diarrhea recurs at that point of time, options would be a trial of Dificid versus fecal transplant.  I will see her back in about 4 weeks time. Plan Vancomycin 125 mg TID for 1 weeks, Vancomycin 125 mg BID for 1 weeks, vancomycin 125 mg once daily for 1 week , then vancomycin 125 mg every other day for 1 weeks then stop    Dr [name]  MD,MRCP [facility])

## 2018-06-13 ENCOUNTER — Telehealth: Payer: Self-pay | Admitting: Gastroenterology

## 2018-06-13 ENCOUNTER — Encounter: Payer: Self-pay | Admitting: Family Medicine

## 2018-06-13 MED ORDER — PAROXETINE HCL 40 MG PO TABS: 40.0000 mg | ORAL_TABLET | ORAL | 1 refills | Status: DC

## 2018-06-13 NOTE — Telephone Encounter (Signed)
Pt is returning call.  

## 2018-06-13 NOTE — Telephone Encounter (Signed)
LVM for pt to return my call.

## 2018-06-13 NOTE — Telephone Encounter (Signed)
Pt is scheduled for Fu apt 07/11/18 and will be out her pills before apt is she supposed to receive a refill to make apt for 8/5 please call pt

## 2018-06-28 ENCOUNTER — Other Ambulatory Visit: Payer: Self-pay | Admitting: Nurse Practitioner

## 2018-06-28 DIAGNOSIS — I10 Essential (primary) hypertension: Secondary | ICD-10-CM

## 2018-06-28 NOTE — Telephone Encounter (Signed)
Patient should not need refill of lisinopril The hospitalist refilled it on 05/13/2018 and gave her 90 pills, which is actually a 180 day supply Verify she is taking it correctly Thank you

## 2018-06-28 NOTE — Telephone Encounter (Signed)
Pt notified and states is taking correctly

## 2018-06-30 ENCOUNTER — Encounter: Payer: Self-pay | Admitting: Family Medicine

## 2018-06-30 ENCOUNTER — Telehealth: Payer: Self-pay | Admitting: Gastroenterology

## 2018-06-30 NOTE — Telephone Encounter (Signed)
Pt states her c-diff has come  Back she has just finished her rx Saturday please call pt

## 2018-07-01 ENCOUNTER — Encounter: Payer: Self-pay | Admitting: Emergency Medicine

## 2018-07-01 ENCOUNTER — Emergency Department
Admission: EM | Admit: 2018-07-01 | Discharge: 2018-07-01 | Disposition: A | Discharge status: Home / Self Care | Payer: Managed Care, Other (non HMO) | Attending: Emergency Medicine | Admitting: Emergency Medicine

## 2018-07-01 ENCOUNTER — Other Ambulatory Visit: Payer: Self-pay

## 2018-07-01 DIAGNOSIS — Z7982 Long term (current) use of aspirin: Secondary | ICD-10-CM | POA: Diagnosis not present

## 2018-07-01 DIAGNOSIS — E119 Type 2 diabetes mellitus without complications: Secondary | ICD-10-CM | POA: Insufficient documentation

## 2018-07-01 DIAGNOSIS — F17210 Nicotine dependence, cigarettes, uncomplicated: Secondary | ICD-10-CM | POA: Insufficient documentation

## 2018-07-01 DIAGNOSIS — Z794 Long term (current) use of insulin: Secondary | ICD-10-CM | POA: Insufficient documentation

## 2018-07-01 DIAGNOSIS — I10 Essential (primary) hypertension: Secondary | ICD-10-CM | POA: Diagnosis not present

## 2018-07-01 DIAGNOSIS — Z79899 Other long term (current) drug therapy: Secondary | ICD-10-CM | POA: Insufficient documentation

## 2018-07-01 DIAGNOSIS — A0472 Enterocolitis due to Clostridium difficile, not specified as recurrent: Secondary | ICD-10-CM

## 2018-07-01 DIAGNOSIS — R1084 Generalized abdominal pain: Secondary | ICD-10-CM | POA: Diagnosis present

## 2018-07-01 LAB — COMPREHENSIVE METABOLIC PANEL
ALBUMIN: 3.6 g/dL (ref 3.5–5.0)
ALK PHOS: 65 U/L (ref 38–126)
ALT: 20 U/L (ref 0–44)
AST: 18 U/L (ref 15–41)
Anion gap: 10 (ref 5–15)
BUN: 24 mg/dL — AB (ref 6–20)
CALCIUM: 8.7 mg/dL — AB (ref 8.9–10.3)
CO2: 28 mmol/L (ref 22–32)
CREATININE: 1.06 mg/dL — AB (ref 0.44–1.00)
Chloride: 102 mmol/L (ref 98–111)
GFR calc Af Amer: >60 mL/min (ref >60)
GFR calc non Af Amer: 58 mL/min — ABNORMAL LOW (ref >60)
GLUCOSE: 277 mg/dL — AB (ref 70–99)
Potassium: 3.2 mmol/L — ABNORMAL LOW (ref 3.5–5.1)
Sodium: 140 mmol/L (ref 135–145)
TOTAL PROTEIN: 6.5 g/dL (ref 6.5–8.1)
Total Bilirubin: 0.6 mg/dL (ref 0.3–1.2)

## 2018-07-01 LAB — CBC WITH DIFFERENTIAL/PLATELET
BASOS ABS: 0.2 10*3/uL — AB (ref 0–0.1)
BASOS PCT: 1 %
EOS ABS: 0.3 10*3/uL (ref 0–0.7)
Eosinophils Relative: 2 %
HEMATOCRIT: 41 % (ref 35.0–47.0)
HEMOGLOBIN: 14.1 g/dL (ref 12.0–16.0)
Lymphocytes Relative: 22 %
Lymphs Abs: 2.9 10*3/uL (ref 1.0–3.6)
MCH: 32.7 pg (ref 26.0–34.0)
MCHC: 34.4 g/dL (ref 32.0–36.0)
MCV: 95.3 fL (ref 80.0–100.0)
MONOS PCT: 6 %
Monocytes Absolute: 0.8 10*3/uL (ref 0.2–0.9)
NEUTROS ABS: 8.8 10*3/uL — AB (ref 1.4–6.5)
NEUTROS PCT: 69 %
Platelets: 280 10*3/uL (ref 150–440)
RBC: 4.3 MIL/uL (ref 3.80–5.20)
RDW: 13.2 % (ref 11.5–14.5)
WBC: 12.9 10*3/uL — AB (ref 3.6–11.0)

## 2018-07-01 LAB — LIPASE, BLOOD: Lipase: 22 U/L (ref 11–51)

## 2018-07-01 MED ORDER — POTASSIUM CHLORIDE CRYS ER 20 MEQ PO TBCR
40.0000 meq | EXTENDED_RELEASE_TABLET | Freq: Once | ORAL | Status: AC
  Administered 2018-07-01: 40 meq via ORAL
  Filled 2018-07-01: qty 2

## 2018-07-01 MED ORDER — VANCOMYCIN 50 MG/ML ORAL SOLUTION: 125.0000 mg | Freq: Four times a day (QID) | ORAL | 0 refills | Status: AC

## 2018-07-01 MED ORDER — VANCOMYCIN 50 MG/ML ORAL SOLUTION
125.0000 mg | ORAL | Status: AC
  Administered 2018-07-01: 125 mg via ORAL
  Filled 2018-07-01: qty 2.5

## 2018-07-01 MED ORDER — POTASSIUM CHLORIDE CRYS ER 20 MEQ PO TBCR: 20.0000 meq | EXTENDED_RELEASE_TABLET | Freq: Every day | ORAL | 0 refills | Status: DC

## 2018-07-01 NOTE — Telephone Encounter (Signed)
Ginger, Can patient see Dr. Servando SnareWohl for follow-up instead of Dr. Tobi BastosAnna per patient request? Please respond to patient with your answer. Thank you, Dr. Baruch GoutyMelinda Lada

## 2018-07-01 NOTE — Discharge Instructions (Addendum)
As we discussed, Dr. Tobi BastosAnna recommended that you go back on the vancomycin by mouth 4 times a day.  He will follow-up with you regarding a clinic appointment to help determine if you would benefit from additional treatment options.  Please take all of your regular medications as well and we also provided a potassium supplement to help with the extra potassium loss you are having through the diarrhea.  Return to the emergency department if you develop any new or worsening symptoms, including but not limited to fever, persistent vomiting, uncontrollable diarrhea, severe and/or worsening abdominal pain, etc.

## 2018-07-01 NOTE — Telephone Encounter (Signed)
Please advise pt states her C-Diff has returned

## 2018-07-01 NOTE — ED Provider Notes (Signed)
[facility] Emergency Department Provider Note  ____________________________________________   First MD Initiated Contact with Patient 07/01/18 [phone]     (approximate)   I have reviewed the triage vital signs and the nursing notes.   HISTORY  Chief Complaint Abdominal Pain    HPI Debra [name] is a 57 y.o. female whose medical history notably includes recurrent or refractory C. difficile colitis.  She presents by private vehicle tonight for evaluation of gradually worsening diarrhea and abdominal cramping for about 24 hours.  She is not in severe distress but she is concerned because this happened to her before and resulted in a 4-week long taper of vancomycin by mouth as per Dr. Vicente Males.  He saw her in the hospital and then started again at a follow-up appointment about a month ago.  She has an appointment with him in 10 days, when her symptoms started back up yesterday she called his office.  They told her she needs to wait for her appointment and she was very upset by this so she came in to the ED tonight.  She denies nausea and vomiting.  She denies fever and chills.  She has intermittent abdominal cramping associated with the loose stools that are happening multiple times a day, are "green and slimy", and feels similar to the prior C. difficile associated diarrhea.  She is ambulatory without difficulty and denies any acute distress at this time.  She says that the diarrhea is worse when she eats so she has not been eating but it is not due to an inability to eat or drink.  She describes the symptoms as acute in onset today and they are moderate.  Nothing particular makes it better or worse except as described above.  Past Medical History:  Diagnosis Date  . Anxiety   . Arthritis   . C. difficile colitis   . C. difficile colitis   . Depression   . Diabetes mellitus without complication ([place])    Type II  . Family history of adverse reaction to anesthesia    Mother - BP drops  . Head injury, closed, with brief LOC ([place]) 2011ish  . Hyperlipidemia   . Hypertension   . Insomnia   . Myalgia 03/29/2018   Due to statin  . Obesity   . Parkinson's disease ([place])   . Seasonal allergies   . Tobacco abuse     Patient Active Problem List   Diagnosis Date Noted  . C. difficile colitis 05/12/2018  . Morbid obesity ([place]) 05/03/2018  . Myalgia 03/29/2018  . Osteopenia 03/16/2018  . Type 2 diabetes with decreased circulation ([place]) 03/04/2018  . Abdominal aortic atherosclerosis ([place]) 03/04/2018  . Adverse reaction to statin medication 03/04/2018  . Uncontrolled diabetes mellitus type 2 with atherosclerosis of arteries of extremities ([place]) 03/04/2018  . Hyperlipidemia 04/20/2017  . Acute low back pain with radicular symptoms, duration less than 6 weeks 08/28/2015  . Lumbar disc disease with radiculopathy 08/20/2015  . Lumbar herniated disc 08/20/2015  . Uncontrolled diabetes mellitus ([place]) 08/20/2015  . Left-sided low back pain with sciatica 06/03/2015  . Bipolar disorder ([place]) 06/03/2015  . Insomnia, persistent 06/03/2015  . BP (high blood pressure) 06/03/2015  . Leg swelling 06/03/2015  . Extreme obesity 06/03/2015  . Compulsive tobacco user syndrome 06/03/2015  . Phlebectasia 06/03/2015    Past Surgical History:  Procedure Laterality Date  . ABDOMINAL HYSTERECTOMY  2012  . BREAST BIOPSY Right   . CESAREAN SECTION  1992  .  CHOLECYSTECTOMY  2013  . LUMBAR LAMINECTOMY/DECOMPRESSION MICRODISCECTOMY Left 09/10/2015   Procedure: Left L4-5 Foraminotomy/Left L5-S1 Diskectomy;  Surgeon: Leeroy Cha, MD;  Location: [facility];  Service: Neurosurgery;  Laterality: Left;  Left L4-5 Foraminotomy/Left L5-S1 Diskectomy    Prior to Admission medications   Medication Sig Start Date End Date Taking? Authorizing Provider  aspirin EC 81 MG tablet Take 81 mg by mouth daily.    [provider]  blood glucose meter kit and supplies Use up to four  times daily as directed, Dx E11.65, LON 99 months 05/18/18   Lada, Satira Anis, MD  calcium citrate-vitamin D (CITRACAL+D) 315-200 MG-UNIT tablet Take 1 tablet by mouth 2 (two) times daily.    [provider]  Cholecalciferol 5000 units capsule Take 5,000 Units by mouth.     [provider]  ezetimibe (ZETIA) 10 MG tablet  05/24/18   [provider]  folic acid (FOLVITE) 850 MCG tablet Take 800 mcg by mouth daily.     [provider]  glucose blood test strip Test blood sugars 4x a day; on insulin; uncontrolled, fluctuating blood sugars; E11.65, LON 99 months 05/18/18   Lada, Satira Anis, MD  HUMALOG KWIKPEN 100 UNIT/ML KiwkPen Give subcutaneously with meals, 20 units in the AM and 20 units with evening meal 05/18/18   Lada, Satira Anis, MD  Insulin Pen Needle (BD ULTRA-FINE PEN NEEDLES) 29G X 12.7MM MISC USE THREE TIMES DAILY AS DIRECTED 11/12/17   [provider]  lisinopril (PRINIVIL,ZESTRIL) 40 MG tablet Take 0.5 tablets (20 mg total) by mouth daily. 05/13/18   Fritzi Mandes, MD  Magnesium 400 MG CAPS Take 250 mg by mouth daily.     [provider]  Melatonin 10 MG TABS Take 1 tablet by mouth at bedtime.    [provider]  Multiple Vitamins-Minerals (CENTRUM WOMEN) TABS Take 1 tablet by mouth.    [provider]  Jonetta Speak LANCETS 27X [place]  02/07/18   [provider]  PARoxetine (PAXIL) 40 MG tablet Take 1 tablet (40 mg total) by mouth every morning. 06/13/18   Lada, Satira Anis, MD  pioglitazone (ACTOS) 30 MG tablet Take 1 tablet (30 mg total) by mouth daily. 05/18/18   Lada, Satira Anis, MD  potassium chloride SA (KLOR-CON M20) 20 MEQ tablet Take 1 tablet (20 mEq total) by mouth daily. 07/01/18   Hinda Kehr, MD  propranolol ER (INDERAL LA) 80 MG 24 hr capsule TAKE 1 CAPSULE (80 MG TOTAL) BY MOUTH ONCE DAILY 12/10/17   [provider]  TRESIBA FLEXTOUCH 200 UNIT/ML SOPN Inject 26 Units into the skin at bedtime. 05/13/18    Fritzi Mandes, MD  vancomycin (VANCOCIN) 50 mg/mL oral solution Take 2.5 mLs (125 mg total) by mouth every 6 (six) hours. 07/01/18 07/31/18  Hinda Kehr, MD    Allergies Metformin and related; Benadryl [diphenhydramine hcl]; Canagliflozin; Depakote er [divalproex sodium er]; Dilaudid [hydromorphone hcl]; Diphenhydramine-zinc acetate; Haloperidol; Hydromorphone; Neosporin [neomycin-bacitracin zn-polymyx]; Singulair [montelukast sodium]; Sitagliptin; Statins; Sulfa antibiotics; and Victoza [liraglutide]  Family History  Problem Relation Age of Onset  . COPD Mother   . Bipolar disorder Father   . Diabetes Mellitus II Father   . Bipolar disorder Brother   . Diabetes Mellitus II Brother   . Bipolar disorder Brother     Social History Social History   Tobacco Use  . Smoking status: Current Every Day Smoker    Packs/day: 0.50    Years: 24.00    Pack  years: 12.00    Types: Cigarettes  . Smokeless tobacco: Never Used  Substance Use Topics  . Alcohol use: Yes    Alcohol/week: 0.0 oz    Comment: occasional- rare  . Drug use: No    Review of Systems Constitutional: No fever/chills Eyes: No visual changes. ENT: No sore throat. Cardiovascular: Denies chest pain. Respiratory: Denies shortness of breath. Gastrointestinal: Recurrent diarrhea with abdominal cramping as described above.  No nausea nor vomiting. Genitourinary: Negative for dysuria. Musculoskeletal: Negative for neck pain.  Negative for back pain. Integumentary: Negative for rash. Neurological: Negative for headaches, focal weakness or numbness.   ____________________________________________   PHYSICAL EXAM:  VITAL SIGNS: ED Triage Vitals  Enc Vitals Group     BP 07/01/18 0106 (!) 148/68     Pulse Rate 07/01/18 0106 83     Resp 07/01/18 0106 (!) 24     Temp 07/01/18 0106 97.7 F (36.5 C)     Temp Source 07/01/18 0106 Oral     SpO2 07/01/18 0106 97 %     Weight 07/01/18 0105 113.4 kg (250 lb)     Height  07/01/18 0105 1.651 m ('5\' 5"' )     Head Circumference --      Peak Flow --      Pain Score 07/01/18 0105 9     Pain Loc --      Pain Edu? --      Excl. in [place]? --     Constitutional: Alert and oriented. Well appearing and in no acute distress. Eyes: Conjunctivae are normal.  Head: Atraumatic. Nose: No congestion/rhinnorhea. Mouth/Throat: Mucous membranes are moist. Neck: No stridor.  No meningeal signs.   Cardiovascular: Normal rate, regular rhythm. Good peripheral circulation. Grossly normal heart sounds. Respiratory: Normal respiratory effort.  No retractions. Lungs CTAB. Gastrointestinal: Soft and nondistended, no tenderness to palpation of the abdomen. Musculoskeletal: No lower extremity tenderness nor edema. No gross deformities of extremities. Neurologic:  Normal speech and language. No gross focal neurologic deficits are appreciated.  Skin:  Skin is warm, dry and intact. No rash noted. Psychiatric: Mood and affect are normal. Speech and behavior are normal.  ____________________________________________   LABS (all labs ordered are listed, but only abnormal results are displayed)  Labs Reviewed  CBC WITH DIFFERENTIAL/PLATELET - Abnormal; Notable for the following components:      Result Value   WBC 12.9 (*)    Neutro Abs 8.8 (*)    Basophils Absolute 0.2 (*)    All other components within normal limits  COMPREHENSIVE METABOLIC PANEL - Abnormal; Notable for the following components:   Potassium 3.2 (*)    Glucose, Bld 277 (*)    BUN 24 (*)    Creatinine, Ser 1.06 (*)    Calcium 8.7 (*)    GFR calc non Af Amer 58 (*)    All other components within normal limits  LIPASE, BLOOD   ____________________________________________  EKG  None - EKG not ordered by ED physician ____________________________________________  RADIOLOGY   ED MD interpretation: No indication for imaging  Official radiology report(s): No results  found.  ____________________________________________   PROCEDURES  Critical Care performed: No   Procedure(s) performed:   Procedures   ____________________________________________   INITIAL IMPRESSION / ASSESSMENT AND PLAN / ED COURSE  As part of my medical decision making, I reviewed the following data within the [place] notes reviewed and incorporated, Labs reviewed , Old chart reviewed, Discussed with gastroenterologist by phone (Dr. Vicente Males)  and Notes from prior ED visits    Differential diagnosis includes, but is not limited to, recurrent and refractory C. difficile colitis, toxic megacolon, other nonspecific colitis/enteritis, disrupted normal GI flora secondary to the persistent antibiotics, etc.  The patient is in no acute distress with normal vital signs, afebrile, not tachycardic.  She has a normal metabolic panel other than some mild hypokalemia and she has only very mild leukocytosis.  I reviewed her medical record and saw the note from the GI clinic that indicates that if she is not successful on this for week long treatment by oral vancomycin, Dr. Vicente Males will consider Dificid or fecal transplant.  Fortunately he is on call tonight so I will call him to discuss recommended treatment and further evaluation.  At this point I have a very low suspicion for toxic megacolon and do not think she needs a CT scan but I will discuss it with him by phone.  Clinical Course as of Jul 01 317  Fri Jul 01, 2018  0230 I spoke by phone with Dr. Vicente Males the gastroenterologist who cares for the patient.  He recommended that we continue the patient on vancomycin for now including a dose tonight and agreed with my recommendation to provide potassium supplements.  He felt that it was not necessary to obtain a CT scan tonight.  He will contact his scheduler and see if she can be seen sooner than August 5 as planned, and will consider changing to Dificid after he reviews the  chart tomorrow.  He will reach out to the patient for further follow-up information.  I will pass this along to the patient and her husband.   [CF]    Clinical Course User Index [CF] Hinda Kehr, MD    ____________________________________________  FINAL CLINICAL IMPRESSION(S) / ED DIAGNOSES  Final diagnoses:  C. difficile colitis     MEDICATIONS GIVEN DURING THIS VISIT:  Medications  potassium chloride SA (K-DUR,KLOR-CON) CR tablet 40 mEq (has no administration in time range)  vancomycin (VANCOCIN) 50 mg/mL oral solution 125 mg (125 mg Oral Given 07/01/18 0249)     ED Discharge Orders        Ordered    vancomycin (VANCOCIN) 50 mg/mL oral solution  Every 6 hours     07/01/18 0232    potassium chloride SA (KLOR-CON M20) 20 MEQ tablet  Daily     07/01/18 0232       Note:  This document was prepared using Dragon voice recognition software and may include unintentional dictation errors.    Hinda Kehr, MD 07/01/18 [phone]

## 2018-07-01 NOTE — ED Triage Notes (Signed)
Patient ambulatory to triage with steady gait, without difficulty or distress noted; pt reports having diarrhea and abd pain since this morning; st hx cdif

## 2018-07-02 ENCOUNTER — Emergency Department
Admission: EM | Admit: 2018-07-02 | Discharge: 2018-07-02 | Disposition: A | Discharge status: Home / Self Care | Payer: Managed Care, Other (non HMO) | Attending: Emergency Medicine | Admitting: Emergency Medicine

## 2018-07-02 ENCOUNTER — Other Ambulatory Visit: Payer: Self-pay

## 2018-07-02 DIAGNOSIS — R109 Unspecified abdominal pain: Secondary | ICD-10-CM | POA: Diagnosis present

## 2018-07-02 DIAGNOSIS — Z5321 Procedure and treatment not carried out due to patient leaving prior to being seen by health care provider: Secondary | ICD-10-CM | POA: Diagnosis not present

## 2018-07-02 NOTE — ED Notes (Signed)
Pt not in waiting room at this time.

## 2018-07-02 NOTE — ED Notes (Signed)
Patient seen leaving the [place] with spouse

## 2018-07-02 NOTE — ED Triage Notes (Signed)
Pt to triage via wheelchair. Reports developed to her right lower abd about 1/2 hour ago. Seen here yesterday and dx'd with colitis.

## 2018-07-08 ENCOUNTER — Telehealth: Payer: Self-pay

## 2018-07-08 NOTE — Telephone Encounter (Signed)
-----   Message from Wyline MoodKiran Anna, MD sent at 07/01/2018  7:37 AM EDT ----- Regarding: FW: c. diff patient Debra [name]   Her diarrhea returned as soon as she stopped Vancomycin- came into ER last night .   1. Can you see if we can get dificid for her. I had ER order her Vancomycin as I was not sure if she would get dificid at a sensible cost .   2. If dificid not available then leave her on vancomycin and let her see me week after at the office- may need to discuss stool transplant  3. Can you inform her that if she gets abdominal pain, worsening of diarrhea over the weekend may need to go to ER to get admitted.   4. Check on her early next week   Kiran   C/c Dr York CeriseForbach   ----- Message ----- From: Loleta RoseForbach, Cory, MD Sent: 07/01/2018   3:04 AM To: Wyline MoodKiran Anna, MD Subject: c. diff patient                                Hi Kiran,  This is the recurrent c. diff patient we discussed by phone.  I told her you would review her chart and be in contact with her about other treatment options.  I also told her to keep her appt on 8/5 unless you or your staff change it to something sooner.  Thanks,    --  [employer]

## 2018-07-08 NOTE — Telephone Encounter (Signed)
Left vm for pt to return my call.  

## 2018-07-11 ENCOUNTER — Ambulatory Visit (INDEPENDENT_AMBULATORY_CARE_PROVIDER_SITE_OTHER): Payer: Managed Care, Other (non HMO) | Admitting: Gastroenterology

## 2018-07-11 ENCOUNTER — Encounter: Payer: Self-pay | Admitting: Gastroenterology

## 2018-07-11 VITALS — BP 126/80 | HR 72 | Ht 65.0 in | Wt 256.8 lb

## 2018-07-11 DIAGNOSIS — A0472 Enterocolitis due to Clostridium difficile, not specified as recurrent: Secondary | ICD-10-CM | POA: Diagnosis not present

## 2018-07-11 DIAGNOSIS — E1151 Type 2 diabetes mellitus with diabetic peripheral angiopathy without gangrene: Secondary | ICD-10-CM

## 2018-07-11 DIAGNOSIS — I70209 Unspecified atherosclerosis of native arteries of extremities, unspecified extremity: Secondary | ICD-10-CM

## 2018-07-11 DIAGNOSIS — E1165 Type 2 diabetes mellitus with hyperglycemia: Secondary | ICD-10-CM

## 2018-07-11 NOTE — Progress Notes (Signed)
[name] MD, MRCP(U.K) [name], [name] [address]  Main: [phone]  Fax: [phone]   Primary Care Physician: Arnetha Courser, MD  Primary Gastroenterologist:  Dr. [name]   No chief complaint on file.   HPI: Debra [name] is a 57 y.o. female   Summary of history :  Summary of history :  She is here today to see to see me as a follow-up to her last visit in June 2019..  I was consulted to see her on 05/12/2018 when she was admitted with C. difficile colitis.  She was initially diagnosed with C. difficile colitis in May 2019 treated with vancomycin.  The diarrhea began in May 2019 after she was treated for an abdominal cyst with clindamycin.  While on vancomycin the diarrhea almost resolved but once she ran out of her course the diarrhea recurred.  On admission she had 10-15 bowel movements per day.  On admission after starting her vancomycin she felt much better.  Interval history    05/30/2018-07/11/2018  She was seen at the ER on 07/01/2018 with recurrence of diarrhea after the vancomycin had run out.  The ER physician spoke to me on the phone and I suggested that since [place] would be hard to obtain in the night, commenced her back on vancomycin and we will try and obtain Dificid as an outpatient.  Doing well, no diarrhea, only lots of gas, one islolated episode of diarrhea yesterday , Dificid was approved and she wil start 10 day course today   Current Outpatient Medications  Medication Sig Dispense Refill  . aspirin EC 81 MG tablet Take 81 mg by mouth daily.    . blood glucose meter kit and supplies Use up to four times daily as directed, Dx E11.65, LON 99 months 1 each 0  . calcium citrate-vitamin D (CITRACAL+D) 315-200 MG-UNIT tablet Take 1 tablet by mouth 2 (two) times daily.    . Cholecalciferol 5000 units capsule Take 5,000 Units by mouth.     . ezetimibe (ZETIA) 10 MG tablet     . folic acid (FOLVITE) 563 MCG tablet Take 800 mcg by  mouth daily.     [name] glucose blood test strip Test blood sugars 4x a day; on insulin; uncontrolled, fluctuating blood sugars; E11.65, LON 99 months 100 each 12  . HUMALOG KWIKPEN 100 UNIT/ML KiwkPen Give subcutaneously with meals, 20 units in the AM and 20 units with evening meal    . Insulin Pen Needle (BD ULTRA-FINE PEN NEEDLES) 29G X 12.7MM MISC USE THREE TIMES DAILY AS DIRECTED    . lisinopril (PRINIVIL,ZESTRIL) 40 MG tablet Take 0.5 tablets (20 mg total) by mouth daily. 90 tablet 0  . Magnesium 400 MG CAPS Take 250 mg by mouth daily.     . Melatonin 10 MG TABS Take 1 tablet by mouth at bedtime.    . Multiple Vitamins-Minerals (CENTRUM WOMEN) TABS Take 1 tablet by mouth.    Glory Rosebush DELICA LANCETS 89H MISC     . PARoxetine (PAXIL) 40 MG tablet Take 1 tablet (40 mg total) by mouth every morning. 90 tablet 1  . pioglitazone (ACTOS) 30 MG tablet Take 1 tablet (30 mg total) by mouth daily. 90 tablet 3  . potassium chloride SA (KLOR-CON M20) 20 MEQ tablet Take 1 tablet (20 mEq total) by mouth daily. 14 tablet 0  . propranolol ER (INDERAL LA) 80 MG 24 hr capsule TAKE 1 CAPSULE (80 MG TOTAL)  BY MOUTH ONCE DAILY  3  . TRESIBA FLEXTOUCH 200 UNIT/ML SOPN Inject 26 Units into the skin at bedtime.    . vancomycin (VANCOCIN) 50 mg/mL oral solution Take 2.5 mLs (125 mg total) by mouth every 6 (six) hours. 300 mL 0   No current facility-administered medications for this visit.     Allergies as of 07/11/2018 - Review Complete 07/02/2018  Allergen Reaction Noted  . Metformin and related Diarrhea 07/22/2015  . Benadryl [diphenhydramine hcl] Swelling 05/22/2011  . Canagliflozin Other (See Comments) 05/06/2016  . Depakote er [divalproex sodium er] Other (See Comments) 05/29/2015  . Dilaudid [hydromorphone hcl] Other (See Comments) 05/29/2015  . Diphenhydramine-zinc acetate Other (See Comments) 12/11/2015  . Haloperidol Other (See Comments) 12/11/2015  . Hydromorphone Other (See Comments) 12/11/2015  .  Neosporin [neomycin-bacitracin zn-polymyx] Swelling 05/29/2015  . Singulair [montelukast sodium] Other (See Comments) 05/29/2015  . Sitagliptin Other (See Comments) 12/11/2015  . Statins Other (See Comments) 02/23/2014  . Sulfa antibiotics Hives 02/23/2014  . Victoza [liraglutide] Other (See Comments) and Nausea And Vomiting 02/23/2014    ROS:  General: Negative for anorexia, weight loss, fever, chills, fatigue, weakness. ENT: Negative for hoarseness, difficulty swallowing , nasal congestion. CV: Negative for chest pain, angina, palpitations, dyspnea on exertion, peripheral edema.  Respiratory: Negative for dyspnea at rest, dyspnea on exertion, cough, sputum, wheezing.  GI: See history of present illness. GU:  Negative for dysuria, hematuria, urinary incontinence, urinary frequency, nocturnal urination.  Endo: Negative for unusual weight change.    Physical Examination:   There were no vitals taken for this visit.  General: Well-nourished, well-developed in no acute distress.  Eyes: No icterus. Conjunctivae pink. Mouth: Oropharyngeal mucosa moist and pink , no lesions erythema or exudate. Lungs: Clear to auscultation bilaterally. Non-labored. Heart: Regular rate and rhythm, no murmurs rubs or gallops.  Abdomen: Bowel sounds are normal, nontender, nondistended, no hepatosplenomegaly or masses, no abdominal bruits or hernia , no rebound or guarding.   Extremities: No lower extremity edema. No clubbing or deformities. Neuro: Alert and oriented x 3.  Grossly intact. Skin: Warm and dry, no jaundice.   Psych: Alert and cooperative, normal mood and affect.   Imaging Studies: No results found.  Assessment and Plan:   Debra [name] is a 57 y.o. y/o female here to see me today as a hospital follow-up for the first recurrence of C. difficile colitis.  She has had a total of 3 episodes of C. difficile diarrhea.  Recently discharged on 05/12/2018 with vancomycin 125 mg 4 times a day will  gradually tapered over a period of weeks.  The diarrhea recurred after cessation of vancomycin in July 2019.  She was restarted on vancomycin at the ER since Dificid was hard to obtain the middle of the night.  She is here today to follow-up.Going to start Dificid today for 10 days. If she fails this next step is a stool transplant.    Dr [name]  MD,MRCP [facility]) Follow up in 3 weeks

## 2018-07-19 ENCOUNTER — Other Ambulatory Visit: Payer: Self-pay | Admitting: Family Medicine

## 2018-07-19 DIAGNOSIS — E0865 Diabetes mellitus due to underlying condition with hyperglycemia: Secondary | ICD-10-CM

## 2018-07-19 DIAGNOSIS — E119 Type 2 diabetes mellitus without complications: Secondary | ICD-10-CM

## 2018-07-19 DIAGNOSIS — Z794 Long term (current) use of insulin: Principal | ICD-10-CM

## 2018-08-01 ENCOUNTER — Other Ambulatory Visit
Admission: RE | Admit: 2018-08-01 | Discharge: 2018-08-01 | Disposition: A | Discharge status: Home / Self Care | Payer: Managed Care, Other (non HMO) | Source: Ambulatory Visit | Attending: Gastroenterology | Admitting: Gastroenterology

## 2018-08-01 ENCOUNTER — Other Ambulatory Visit: Payer: Self-pay

## 2018-08-01 DIAGNOSIS — A0472 Enterocolitis due to Clostridium difficile, not specified as recurrent: Secondary | ICD-10-CM | POA: Diagnosis not present

## 2018-08-01 LAB — C DIFFICILE QUICK SCREEN W PCR REFLEX
C DIFFICILE (CDIFF) TOXIN: POSITIVE — AB
C Diff antigen: POSITIVE — AB
C Diff interpretation: DETECTED

## 2018-08-02 ENCOUNTER — Other Ambulatory Visit: Payer: Self-pay

## 2018-08-02 ENCOUNTER — Telehealth: Payer: Self-pay

## 2018-08-02 ENCOUNTER — Telehealth: Payer: Self-pay | Admitting: Internal Medicine

## 2018-08-02 DIAGNOSIS — A0472 Enterocolitis due to Clostridium difficile, not specified as recurrent: Secondary | ICD-10-CM

## 2018-08-02 NOTE — Telephone Encounter (Signed)
Pt notified of results. Pt will restart Dificid today. Referral sent to Dr. Rhea BeltonPyrtle for stool transplant consultation.

## 2018-08-02 NOTE — Telephone Encounter (Signed)
-----   Message from Wyline MoodKiran Anna, MD sent at 08/01/2018  2:29 PM EDT ----- Areana Kosanke inform C diff has recurred- restart on dificid- since 3rd episode- [facility] as we are still on hold to perform them at this time  C/c Lada, Janit BernMelinda P, MD

## 2018-08-02 NOTE — Telephone Encounter (Signed)
For consideration of FMT she needs to be 1st seen by [name] with ID; then ID will involve GI when/if FMT is appropriate

## 2018-08-02 NOTE — Telephone Encounter (Signed)
See note below from Dr. Rhea BeltonPyrtle. Pt needs to be referred to ID [name] MD for fecal transplant. She will involve GI when needed.

## 2018-08-02 NOTE — Telephone Encounter (Signed)
Dr. Rhea BeltonPyrtle pt being referred for recurrent cdiif/stool transplant. Requesting to be seen by Dr. Rhea BeltonPyrtle. Please advise if you will accept pt and urgency of scheduling.

## 2018-08-03 ENCOUNTER — Telehealth: Payer: Self-pay | Admitting: Gastroenterology

## 2018-08-03 ENCOUNTER — Other Ambulatory Visit: Payer: Self-pay

## 2018-08-03 DIAGNOSIS — A0472 Enterocolitis due to Clostridium difficile, not specified as recurrent: Secondary | ICD-10-CM

## 2018-08-03 NOTE — Telephone Encounter (Signed)
Patient is [place] hear from the [place] but hasn't. Please call

## 2018-08-04 ENCOUNTER — Ambulatory Visit: Payer: Managed Care, Other (non HMO) | Admitting: Gastroenterology

## 2018-08-04 ENCOUNTER — Telehealth: Payer: Self-pay

## 2018-08-04 NOTE — Telephone Encounter (Signed)
Marcelino DusterMichelle ,   Inform we only have two options as an outpatient vancomycin and dificid, since dificid worked- suggested to try it. If she feels she is getting very sick, dehydrated may need to get IV fluids and be admitted. If she did chose to go to the ER , [place] would be a better options if she can because then they can see her right there and plan for a stool transplant as well. If she has any other thoughts am willing to see if we can do something different.    C/c Lada, Janit BernMelinda P, MD

## 2018-08-04 NOTE — Telephone Encounter (Signed)
Patient called in distress and crying-she can not take this anymore.  She stated since started Dificid on Tuesday her diarrhea has gotten worse its uncontrolled.  She is in severe pain.  I told her I would check on the status of her referral.  She then said that Dr. Sherald BargePyrtles office has referred her to ID doctor in [place] (Dr. [name]) for fecal transplant (Noted in chart). I contacted Dr. Stanford BreedSniders office and they are in the process of reviewing her chart.  Will call patient and schedule within 7 days.  Patient has been informed of this as well.  Thanks [employer]

## 2018-08-04 NOTE — Telephone Encounter (Signed)
She is currently on dificid-but states its gotten worse.  She said she still has some vancomycin.  This past Tuesday is when she started the dificid.  She said she plans to stop taking [place]. She does not feel she is getting dehydrated.  She has been drinking lots of fluids.  Was able to tolerate pretzels.  Thanks [employer]le

## 2018-08-05 ENCOUNTER — Encounter (HOSPITAL_COMMUNITY): Payer: Self-pay | Admitting: Emergency Medicine

## 2018-08-05 ENCOUNTER — Other Ambulatory Visit: Payer: Self-pay

## 2018-08-05 ENCOUNTER — Emergency Department (HOSPITAL_COMMUNITY)
Admission: EM | Admit: 2018-08-05 | Discharge: 2018-08-05 | Disposition: A | Discharge status: Home / Self Care | Payer: Managed Care, Other (non HMO) | Attending: Emergency Medicine | Admitting: Emergency Medicine

## 2018-08-05 DIAGNOSIS — E119 Type 2 diabetes mellitus without complications: Secondary | ICD-10-CM | POA: Insufficient documentation

## 2018-08-05 DIAGNOSIS — I10 Essential (primary) hypertension: Secondary | ICD-10-CM | POA: Insufficient documentation

## 2018-08-05 DIAGNOSIS — Z79899 Other long term (current) drug therapy: Secondary | ICD-10-CM | POA: Insufficient documentation

## 2018-08-05 DIAGNOSIS — A0472 Enterocolitis due to Clostridium difficile, not specified as recurrent: Secondary | ICD-10-CM | POA: Diagnosis not present

## 2018-08-05 DIAGNOSIS — E785 Hyperlipidemia, unspecified: Secondary | ICD-10-CM | POA: Insufficient documentation

## 2018-08-05 DIAGNOSIS — Z794 Long term (current) use of insulin: Secondary | ICD-10-CM | POA: Diagnosis not present

## 2018-08-05 DIAGNOSIS — G20 Parkinson's disease: Secondary | ICD-10-CM | POA: Diagnosis not present

## 2018-08-05 DIAGNOSIS — F17210 Nicotine dependence, cigarettes, uncomplicated: Secondary | ICD-10-CM | POA: Insufficient documentation

## 2018-08-05 DIAGNOSIS — R197 Diarrhea, unspecified: Secondary | ICD-10-CM | POA: Diagnosis present

## 2018-08-05 DIAGNOSIS — Z7982 Long term (current) use of aspirin: Secondary | ICD-10-CM | POA: Insufficient documentation

## 2018-08-05 LAB — CBC WITH DIFFERENTIAL/PLATELET
ABS IMMATURE GRANULOCYTES: 0.1 10*3/uL (ref 0.0–0.1)
BASOS ABS: 0.1 10*3/uL (ref 0.0–0.1)
Basophils Relative: 1 %
EOS PCT: 5 %
Eosinophils Absolute: 0.4 10*3/uL (ref 0.0–0.7)
HCT: 42.1 % (ref 36.0–46.0)
HEMOGLOBIN: 14.3 g/dL (ref 12.0–15.0)
Immature Granulocytes: 1 %
LYMPHS PCT: 32 %
Lymphs Abs: 3.1 10*3/uL (ref 0.7–4.0)
MCH: 32.5 pg (ref 26.0–34.0)
MCHC: 34 g/dL (ref 30.0–36.0)
MCV: 95.7 fL (ref 78.0–100.0)
Monocytes Absolute: 0.6 10*3/uL (ref 0.1–1.0)
Monocytes Relative: 7 %
Neutro Abs: 5.4 10*3/uL (ref 1.7–7.7)
Neutrophils Relative %: 56 %
Platelets: 266 10*3/uL (ref 150–400)
RBC: 4.4 MIL/uL (ref 3.87–5.11)
RDW: 12.1 % (ref 11.5–15.5)
WBC: 9.7 10*3/uL (ref 4.0–10.5)

## 2018-08-05 LAB — COMPREHENSIVE METABOLIC PANEL
ALT: 20 U/L (ref 0–44)
ANION GAP: 10 (ref 5–15)
AST: 20 U/L (ref 15–41)
Albumin: 3.7 g/dL (ref 3.5–5.0)
Alkaline Phosphatase: 57 U/L (ref 38–126)
BILIRUBIN TOTAL: 1 mg/dL (ref 0.3–1.2)
BUN: 15 mg/dL (ref 6–20)
CO2: 25 mmol/L (ref 22–32)
Calcium: 9.1 mg/dL (ref 8.9–10.3)
Chloride: 104 mmol/L (ref 98–111)
Creatinine, Ser: 0.76 mg/dL (ref 0.44–1.00)
GFR calc Af Amer: >60 mL/min (ref >60)
GFR calc non Af Amer: >60 mL/min (ref >60)
Glucose, Bld: 107 mg/dL — ABNORMAL HIGH (ref 70–99)
POTASSIUM: 3.6 mmol/L (ref 3.5–5.1)
Sodium: 139 mmol/L (ref 135–145)
TOTAL PROTEIN: 6.3 g/dL — AB (ref 6.5–8.1)

## 2018-08-05 MED ORDER — SODIUM CHLORIDE 0.9 % IV BOLUS
1000.0000 mL | Freq: Once | INTRAVENOUS | Status: AC
  Administered 2018-08-05: 1000 mL via INTRAVENOUS

## 2018-08-05 NOTE — ED Triage Notes (Signed)
Patient reports that she has had C-diff since May.  This episode of diarrhea started on Monday. She reports to have seen "someone who referred me for a fecal transplant, and I haven't gotten to it."

## 2018-08-05 NOTE — Discharge Instructions (Addendum)
Continue your home medications as previously prescribed. Follow-up with the infectious disease specialist listed below along with your GI specialist for discussion of stool transplant to help with your infection. Return to ED for worsening symptoms, blood in the stool, severe abdominal pain, fever.

## 2018-08-05 NOTE — ED Notes (Signed)
D/c reviewed with patient 

## 2018-08-05 NOTE — ED Provider Notes (Signed)
[facility] EMERGENCY DEPARTMENT Provider Note   CSN: [id number] Arrival date & time: 08/05/18  4098     History   Chief Complaint Chief Complaint  Patient presents with  . Diarrhea    HPI Debra [name] is a 57 y.o. female with a past medical history of diabetes, hypertension, hyperlipidemia, Parkinson's disease, recurrent/refractory C. difficile colitis, who presents to ED for evaluation of 4-day history of diarrhea.  States that she will have 8-9 episodes of diarrhea daily along with generalized abdominal cramping.  States that this feels similar to her prior C. difficile flareups.  Patient has tried p.o. vancomycin and p.o. Dificid which has helped her in the past.  She saw her GI specialist at [facility] on 8/26.  She tested positive for C. difficile at that time.  She was put back on Dificid starting 8/27.  However, she states that her symptoms persisted.  She is in the process of undergoing a fecal transplant however, she states that no one has followed up with her about it.  Her GI specialist sent her to [facility] ED for possible admission and fecal transplant.  She reports generalized abdominal cramping and nausea.  Denies any vomiting, sick contacts with similar symptoms, fever. She states that her episodes of C. difficile began after she was on clindamycin in May 2019 for a cellulitis.  HPI  Past Medical History:  Diagnosis Date  . Anxiety   . Arthritis   . C. difficile colitis   . C. difficile colitis   . Depression   . Diabetes mellitus without complication ([place])    Type II  . Family history of adverse reaction to anesthesia    Mother - BP drops  . Head injury, closed, with brief LOC ([place]) 2011ish  . Hyperlipidemia   . Hypertension   . Insomnia   . Myalgia 03/29/2018   Due to statin  . Obesity   . Parkinson's disease ([place])   . Seasonal allergies   . Tobacco abuse     Patient Active Problem List   Diagnosis Date Noted  . C. difficile colitis 05/12/2018   . Morbid obesity ([place]) 05/03/2018  . Myalgia 03/29/2018  . Osteopenia 03/16/2018  . Type 2 diabetes with decreased circulation ([place]) 03/04/2018  . Abdominal aortic atherosclerosis ([place]) 03/04/2018  . Adverse reaction to statin medication 03/04/2018  . Uncontrolled diabetes mellitus type 2 with atherosclerosis of arteries of extremities ([place]) 03/04/2018  . Hyperlipidemia 04/20/2017  . Acute low back pain with radicular symptoms, duration less than 6 weeks 08/28/2015  . Lumbar disc disease with radiculopathy 08/20/2015  . Lumbar herniated disc 08/20/2015  . Uncontrolled diabetes mellitus ([place]) 08/20/2015  . Left-sided low back pain with sciatica 06/03/2015  . Bipolar disorder ([place]) 06/03/2015  . Insomnia, persistent 06/03/2015  . BP (high blood pressure) 06/03/2015  . Leg swelling 06/03/2015  . Extreme obesity 06/03/2015  . Compulsive tobacco user syndrome 06/03/2015  . Phlebectasia 06/03/2015    Past Surgical History:  Procedure Laterality Date  . ABDOMINAL HYSTERECTOMY  2012  . BREAST BIOPSY Right   . CESAREAN SECTION  1992  . CHOLECYSTECTOMY  2013  . LUMBAR LAMINECTOMY/DECOMPRESSION MICRODISCECTOMY Left 09/10/2015   Procedure: Left L4-5 Foraminotomy/Left L5-S1 Diskectomy;  Surgeon: Leeroy Cha, MD;  Location: [facility];  Service: Neurosurgery;  Laterality: Left;  Left L4-5 Foraminotomy/Left L5-S1 Diskectomy     OB History    Gravida  1   Para  1   Term  Preterm      AB      Living        SAB      TAB      Ectopic      Multiple      Live Births               Home Medications    Prior to Admission medications   Medication Sig Start Date End Date Taking? Authorizing Provider  aspirin EC 81 MG tablet Take 81 mg by mouth daily.    [provider]  blood glucose meter kit and supplies Use up to four times daily as directed, Dx E11.65, LON 99 months 05/18/18   Lada, Satira Anis, MD  calcium citrate-vitamin D (CITRACAL+D) 315-200 MG-UNIT  tablet Take 1 tablet by mouth 2 (two) times daily.    [provider]  Cholecalciferol 5000 units capsule Take 5,000 Units by mouth.     [provider]  ezetimibe (ZETIA) 10 MG tablet  05/24/18   [provider]  folic acid (FOLVITE) 818 MCG tablet Take 800 mcg by mouth daily.     [provider]  glucose blood (ONE TOUCH ULTRA TEST) test strip USE UP TO 4 TIMES A DAY AS DIRECTED DX: E11.65 07/19/18   Arnetha Courser, MD  HUMALOG KWIKPEN 100 UNIT/ML KiwkPen Give subcutaneously with meals, 20 units in the AM and 20 units with evening meal 05/18/18   Lada, Satira Anis, MD  Insulin Pen Needle (BD ULTRA-FINE PEN NEEDLES) 29G X 12.7MM MISC USE THREE TIMES DAILY AS DIRECTED 11/12/17   [provider]  lisinopril (PRINIVIL,ZESTRIL) 40 MG tablet Take 0.5 tablets (20 mg total) by mouth daily. 05/13/18   Fritzi Mandes, MD  Magnesium 400 MG CAPS Take 250 mg by mouth daily.     [provider]  Melatonin 10 MG TABS Take 1 tablet by mouth at bedtime.    [provider]  Multiple Vitamins-Minerals (CENTRUM WOMEN) TABS Take 1 tablet by mouth.    [provider]  Jonetta Speak LANCETS 56D [place]  02/07/18   [provider]  PARoxetine (PAXIL) 40 MG tablet Take 1 tablet (40 mg total) by mouth every morning. 06/13/18   Lada, Satira Anis, MD  pioglitazone (ACTOS) 30 MG tablet Take 1 tablet (30 mg total) by mouth daily. 05/18/18   Lada, Satira Anis, MD  potassium chloride SA (KLOR-CON M20) 20 MEQ tablet Take 1 tablet (20 mEq total) by mouth daily. 07/01/18   Hinda Kehr, MD  propranolol ER (INDERAL LA) 80 MG 24 hr capsule TAKE 1 CAPSULE (80 MG TOTAL) BY MOUTH ONCE DAILY 12/10/17   [provider]  TRESIBA FLEXTOUCH 200 UNIT/ML SOPN Inject 26 Units into the skin at bedtime. 05/13/18   Fritzi Mandes, MD    Family History Family History  Problem Relation Age of Onset  . COPD Mother   . Bipolar disorder Father   . Diabetes Mellitus II Father   .  Bipolar disorder Brother   . Diabetes Mellitus II Brother   . Bipolar disorder Brother     Social History Social History   Tobacco Use  . Smoking status: Current Every Day Smoker    Packs/day: 0.50    Years: 24.00    Pack years: 12.00    Types: Cigarettes  . Smokeless tobacco: Never Used  Substance Use Topics  . Alcohol use: Yes    Alcohol/week: 0.0 standard drinks    Comment: occasional- rare  .  Drug use: No     Allergies   Metformin and related; Benadryl [diphenhydramine hcl]; Canagliflozin; Depakote er [divalproex sodium er]; Dilaudid [hydromorphone hcl]; Diphenhydramine-zinc acetate; Haloperidol; Hydromorphone; Neosporin [neomycin-bacitracin zn-polymyx]; Singulair [montelukast sodium]; Sitagliptin; Statins; Sulfa antibiotics; and Victoza [liraglutide]   Review of Systems Review of Systems  Constitutional: Negative for appetite change, chills and fever.  HENT: Negative for ear pain, rhinorrhea, sneezing and sore throat.   Eyes: Negative for photophobia and visual disturbance.  Respiratory: Negative for cough, chest tightness, shortness of breath and wheezing.   Cardiovascular: Negative for chest pain and palpitations.  Gastrointestinal: Positive for abdominal pain (cramping) and diarrhea. Negative for blood in stool, constipation, nausea and vomiting.  Genitourinary: Negative for dysuria, hematuria and urgency.  Musculoskeletal: Negative for myalgias.  Skin: Negative for rash.  Neurological: Negative for dizziness, weakness and light-headedness.     Physical Exam Updated Vital Signs BP (!) 153/75   Pulse 65   Temp 98.7 F (37.1 C) (Oral)   Resp 20   Ht '5\' 5"'  (1.651 m)   Wt 113.4 kg   SpO2 (!) 89%   BMI 41.60 kg/m   Physical Exam  Constitutional: She appears well-developed and well-nourished. No distress.  HENT:  Head: Normocephalic and atraumatic.  Nose: Nose normal.  Eyes: Conjunctivae and EOM are normal. Right eye exhibits no discharge. Left eye  exhibits no discharge. No scleral icterus.  Neck: Normal range of motion. Neck supple.  Cardiovascular: Normal rate, regular rhythm, normal heart sounds and intact distal pulses. Exam reveals no gallop and no friction rub.  No murmur heard. Pulmonary/Chest: Effort normal and breath sounds normal. No respiratory distress.  Abdominal: Soft. Bowel sounds are normal. She exhibits no distension. There is no tenderness. There is no guarding.  Musculoskeletal: Normal range of motion. She exhibits no edema.  Neurological: She is alert. She exhibits normal muscle tone. Coordination normal.  Skin: Skin is warm and dry. No rash noted.  Psychiatric: She has a normal mood and affect.  Nursing note and vitals reviewed.    ED Treatments / Results  Labs (all labs ordered are listed, but only abnormal results are displayed) Labs Reviewed  COMPREHENSIVE METABOLIC PANEL - Abnormal; Notable for the following components:      Result Value   Glucose, Bld 107 (*)    Total Protein 6.3 (*)    All other components within normal limits  CBC WITH DIFFERENTIAL/PLATELET    EKG None  Radiology No results found.  Procedures Procedures (including critical care time)  Medications Ordered in ED Medications  sodium chloride 0.9 % bolus 1,000 mL (1,000 mLs Intravenous New Bag/Given 08/05/18 1104)     Initial Impression / Assessment and Plan / ED Course  I have reviewed the triage vital signs and the nursing notes.  Pertinent labs & imaging results that were available during my care of the patient were reviewed by me and considered in my medical decision making (see chart for details).     57 year old female presents to ED for evaluation of ongoing diarrhea related to C. difficile infection.  She had history of recurrent C. difficile infections since May 2019.  She has been treated with vancomycin p.o. x2 courses as well as Dificid course x1.  She is currently on day 4 of her 10-day Dificid course as  prescribed by her GI specialist.  She is in the process of being evaluated for possible stool transplant.  She has not yet followed up with infectious disease and she is unsure  why she would need to follow-up with infectious disease.  She was sent here to [facility] ED by her GI specialist and [place] because she states that her specialist does not evaluate for stool transplant.  She reports 9 episodes of diarrhea daily.  Reports generalized abdominal cramping which she states is typical for her infections but denies any worsening pain.  On exam abdomen is soft, nontender nondistended.  Patient was only able to give 1, slightly formed small stool sample today.  She has not had any diarrhea here in the ED.  Lab work including CBC, BMP unremarkable with no signs of severe infection or dehydration.  I spoke to Dr. Megan Salon of infectious disease.  He stated that patient will need to follow-up with Dr. Graylon Good of infectious disease working with her GI specialist in order to obtain proper preliminary work-up for possible stool transplant and finding a suitable donor.  I then spoke to [facility] who recommends same advice as infectious disease.  They also state that it could take time for the Dificid to work for this course as she is taking it in the past.  Is not unusual for symptoms to last longer without improvement with subsequent doses.  I informed patient of these findings.  Will give contact information for Dr. Graylon Good and advised her to continue Dificid as directed.  Doubt toxic megacolon or other surgical/emergent cause of symptoms. Will advise her to return to ED for any severe worsening symptoms.  Portions of this note were generated with [employer]. Dictation errors may occur despite best attempts at proofreading.   Final Clinical Impressions(s) / ED Diagnoses   Final diagnoses:  C. difficile diarrhea    ED Discharge Orders    None       Delia Heady, PA-C 08/05/18 1336    Tegeler,  Gwenyth Allegra, MD 08/05/18 [phone]

## 2018-08-06 ENCOUNTER — Other Ambulatory Visit: Payer: Self-pay | Admitting: Family Medicine

## 2018-08-09 NOTE — Telephone Encounter (Signed)
Last OV: 05/18/18 Next: 08/19/18

## 2018-08-10 ENCOUNTER — Telehealth: Payer: Self-pay | Admitting: Gastroenterology

## 2018-08-10 NOTE — Telephone Encounter (Signed)
Patient calling requesting to speak with Ginger about getting medication dificid sent to specialty pharmacy. Ginger is not Dr.Anna's CMA.

## 2018-08-11 ENCOUNTER — Other Ambulatory Visit: Payer: Self-pay | Admitting: *Deleted

## 2018-08-11 NOTE — Patient Outreach (Signed)
[facility]) Care Management  08/11/2018  Debra [name] 01/05/1961 [id number]   Subjective: Telephone call to patient's home  / mobile number, no answer, left HIPAA compliant voicemail message, and requested call back.    Objective: Per KPN (Knowledge Performance Now, point of care tool), [employer] iCollaborate, and chart review, patient had no recent hospitalized, had the following 6 ED visits in the last 6 months: 08/05/18 for ongoing diarrhea related to C. difficile infection, 07/01/18 for C. difficile infection, 05/12/18 for C. difficile infection, 04/19/18 for C. difficile infection, 03/08/18 for abscess, cellulitis of groin, 02/19/18 for abdominal pain.   Patient also has a history of diabetes, hypertension, hyperlipidemia, Abdominal aortic atherosclerosis, Lumbar disc disease with radiculopathy, Bipolar disorder, Phlebectasia, Head injury, closed, Parkinson's disease, and Compulsive tobacco user syndrome.      Assessment: Received [facility] Consult for high ED utilization on 08/09/18.   Transition of care follow up pending patient contact.       Plan: RNCM will send unsuccessful outreach  letter, [facility] pamphlet, will call patient for 2nd telephone outreach attempt, transition of care follow up, and proceed with case closure, within 10 business days if no return call.        Clotile Whittington H. Gardiner Barefoot, BSN, CCM [facility] Care Management [facility] Telephonic CM Phone: [phone] Fax: [phone]

## 2018-08-11 NOTE — Telephone Encounter (Signed)
Ok'd per Dr. Tobi Bastos to refill Dificid. Order faxed to [facility]. Pt aware.

## 2018-08-15 ENCOUNTER — Other Ambulatory Visit: Payer: Self-pay | Admitting: *Deleted

## 2018-08-15 ENCOUNTER — Encounter: Payer: Self-pay | Admitting: *Deleted

## 2018-08-15 NOTE — Patient Outreach (Signed)
[facility]) Care Management  08/15/2018  Debra [name] May 20, 1961 [id number]    Subjective: Telephone call to patient's home / mobile number, spoke with patient, and HIPAA verified.  Discussed [name] Care Management [facility] Consult follow up, patient voiced understanding, and is in agreement to follow up.   Patient states her ED visits have been related to C. difficile colitis, has a follow up appointment (initial consult) with infectious disease MD Dr. Judyann Munson on 08/17/18, to discuss fecal transplant treatment plan, and with her primary MD on 08/19/18.   Patient states she is able to manage self care and has assistance as needed.  Patient voices understanding of medical diagnosis and treatment plan.   States she is accessing her Debra [name] benefits as needed via member services number on back of card or through [email or web address]   Patient states she does not have any education material, transition of care, care coordination, disease management, disease monitoring, transportation, community resource, or pharmacy needs at this time.  States she is very appreciative of the follow up and is in agreement to receive [facility] Care Management information.     Objective: Per KPN (Knowledge Performance Now, point of care tool), [employer] i[employer]aborate, and chart review, patient had no recent hospitalized, had the following 6 ED visits in the last 6 months: 08/05/18 for ongoing diarrhea related to C. difficile infection, 07/01/18 for C. difficile infection, 05/12/18 for C. difficile infection, 04/19/18 for C. difficile infection, 03/08/18 for abscess, cellulitis of groin, 02/19/18 for abdominal pain.   Patient also has a history of diabetes, hypertension, hyperlipidemia, Abdominal aortic atherosclerosis, Lumbar disc disease with radiculopathy, Bipolar disorder, Phlebectasia, Head injury, closed, Parkinson's disease, and Compulsive tobacco user syndrome.      Assessment: Received [facility] Consult for high ED  utilization on 08/09/18.   [facility] Consult follow up completed, no care management needs, and will proceed with case closure.      Plan: RNCM will send patient successful outreach letter, [facility] pamphlet, and magnet. RNCM will complete case closure due to follow up completed / no care management needs.        Debra [name], BSN, CCM [facility] Care Management [facility] Telephonic CM Phone: [phone] Fax: [phone]

## 2018-08-17 ENCOUNTER — Ambulatory Visit (INDEPENDENT_AMBULATORY_CARE_PROVIDER_SITE_OTHER): Payer: Managed Care, Other (non HMO) | Admitting: Internal Medicine

## 2018-08-17 ENCOUNTER — Encounter: Payer: Self-pay | Admitting: Internal Medicine

## 2018-08-17 VITALS — BP 154/81 | HR 73 | Temp 98.0°F | Wt 264.8 lb

## 2018-08-17 DIAGNOSIS — I70209 Unspecified atherosclerosis of native arteries of extremities, unspecified extremity: Secondary | ICD-10-CM

## 2018-08-17 DIAGNOSIS — E1151 Type 2 diabetes mellitus with diabetic peripheral angiopathy without gangrene: Secondary | ICD-10-CM

## 2018-08-17 DIAGNOSIS — E1165 Type 2 diabetes mellitus with hyperglycemia: Secondary | ICD-10-CM

## 2018-08-17 DIAGNOSIS — A0472 Enterocolitis due to Clostridium difficile, not specified as recurrent: Secondary | ICD-10-CM | POA: Diagnosis not present

## 2018-08-17 NOTE — Progress Notes (Signed)
RFV: recurrent c.difficile  Patient ID: Debra [name], female   DOB: [birth year], 57 y.o.   MRN: [id number]  HPI Debra [name] is a 57 yo F who generally does not receive abtx frequently. She recalls that she developed a  Boil, and gave a course of  Clindamycin then given amoxicillin- 10d course in april but roughly then started to have watery diarrhea in may dx with cdiffiicle. Did 3 courses of vancomycin . Noticed improvement while on vancomycin and then would come back. Even had vanco taper that unsuccessful and now on difficid x 3 courses   3 BM, right after you eat. -   Taking 1 tab twice a day -started on 9/10   Outpatient Encounter Medications as of 08/17/2018  Medication Sig  . aspirin EC 81 MG tablet Take 81 mg by mouth daily.  . blood glucose meter kit and supplies Use up to four times daily as directed, Dx E11.65, LON 99 months  . calcium citrate-vitamin D (CITRACAL+D) 315-200 MG-UNIT tablet Take 1 tablet by mouth 2 (two) times daily.  . Cholecalciferol 5000 units capsule Take 5,000 Units by mouth.   . ezetimibe (ZETIA) 10 MG tablet   . folic acid (FOLVITE) 473 MCG tablet Take 800 mcg by mouth daily.   [name] glucose blood (ONE TOUCH ULTRA TEST) test strip USE UP TO 4 TIMES A DAY AS DIRECTED DX: E11.65  . HUMALOG KWIKPEN 100 UNIT/ML KiwkPen Give subcutaneously with meals, 20 units in the AM and 20 units with evening meal  . Insulin Pen Needle (BD ULTRA-FINE PEN NEEDLES) 29G X 12.7MM MISC USE THREE TIMES DAILY AS DIRECTED  . lisinopril (PRINIVIL,ZESTRIL) 40 MG tablet TAKE 1 TABLET BY MOUTH EVERY DAY  . Magnesium 400 MG CAPS Take 250 mg by mouth daily.   . Melatonin 10 MG TABS Take 1 tablet by mouth at bedtime.  . Multiple Vitamins-Minerals (CENTRUM WOMEN) TABS Take 1 tablet by mouth.  Glory Rosebush DELICA LANCETS 40Z MISC   . PARoxetine (PAXIL) 40 MG tablet Take 1 tablet (40 mg total) by mouth every morning.  . pioglitazone (ACTOS) 30 MG tablet Take 1 tablet (30 mg total) by mouth daily.  .  potassium chloride SA (KLOR-CON M20) 20 MEQ tablet Take 1 tablet (20 mEq total) by mouth daily.  . propranolol ER (INDERAL LA) 80 MG 24 hr capsule TAKE 1 CAPSULE (80 MG TOTAL) BY MOUTH ONCE DAILY  . TRESIBA FLEXTOUCH 200 UNIT/ML SOPN Inject 26 Units into the skin at bedtime.  [name] DIFICID 200 MG TABS tablet    No facility-administered encounter medications on file as of 08/17/2018.      Patient Active Problem List   Diagnosis Date Noted  . C. difficile colitis 05/12/2018  . Morbid obesity ([place]) 05/03/2018  . Myalgia 03/29/2018  . Osteopenia 03/16/2018  . Type 2 diabetes with decreased circulation ([place]) 03/04/2018  . Abdominal aortic atherosclerosis ([place]) 03/04/2018  . Adverse reaction to statin medication 03/04/2018  . Uncontrolled diabetes mellitus type 2 with atherosclerosis of arteries of extremities ([place]) 03/04/2018  . Hyperlipidemia 04/20/2017  . Acute low back pain with radicular symptoms, duration less than 6 weeks 08/28/2015  . Lumbar disc disease with radiculopathy 08/20/2015  . Lumbar herniated disc 08/20/2015  . Uncontrolled diabetes mellitus ([place]) 08/20/2015  . Left-sided low back pain with sciatica 06/03/2015  . Bipolar disorder ([place]) 06/03/2015  . Insomnia, persistent 06/03/2015  . BP (high blood pressure) 06/03/2015  . Leg swelling 06/03/2015  . Extreme obesity 06/03/2015  .  Compulsive tobacco user syndrome 06/03/2015  . Phlebectasia 06/03/2015     Health Maintenance Due  Topic Date Due  . OPHTHALMOLOGY EXAM  06/07/2018  . INFLUENZA VACCINE  07/07/2018    Social History   Tobacco Use  . Smoking status: Current Every Day Smoker    Packs/day: 0.50    Years: 24.00    Pack years: 12.00    Types: Cigarettes  . Smokeless tobacco: Never Used  Substance Use Topics  . Alcohol use: Yes    Alcohol/week: 0.0 standard drinks    Comment: occasional- rare  . Drug use: No  family history includes Bipolar disorder in her brother, brother, and father; COPD in her mother;  Diabetes Mellitus II in her brother and father. Review of Systems + diarrhea, per hpi, otherwise 12 point ros is negative Physical Exam   BP (!) 154/81   Pulse 73   Temp 98 F (36.7 C)   Wt 264 lb 12.8 oz (120.1 kg)   BMI 44.07 kg/m   Physical Exam  Constitutional:  oriented to person, place, and time. appears well-developed and well-nourished. No distress.  HENT: [place]/AT, PERRLA, no scleral icterus Mouth/Throat: Oropharynx is clear and moist. No oropharyngeal exudate.  Cardiovascular: Normal rate, regular rhythm and normal heart sounds. Exam reveals no gallop and no friction rub.  No murmur heard.  Pulmonary/Chest: Effort normal and breath sounds normal. No respiratory distress.  has no wheezes.  Neck = supple, no nuchal rigidity Abdominal: Soft. Bowel sounds are normal.  exhibits no distension. There is no tenderness.  Lymphadenopathy: no cervical adenopathy. No axillary adenopathy Neurological: alert and oriented to person, place, and time.  Skin: Skin is warm and dry. No rash noted. No erythema.  Psychiatric: a normal mood and affect.  behavior is normal.   CBC Lab Results  Component Value Date   WBC 9.7 08/05/2018   RBC 4.40 08/05/2018   HGB 14.3 08/05/2018   HCT 42.1 08/05/2018   PLT 266 08/05/2018   MCV 95.7 08/05/2018   MCH 32.5 08/05/2018   MCHC 34.0 08/05/2018   RDW 12.1 08/05/2018   LYMPHSABS 3.1 08/05/2018   MONOABS 0.6 08/05/2018   EOSABS 0.4 08/05/2018    BMET Lab Results  Component Value Date   NA 139 08/05/2018   K 3.6 08/05/2018   CL 104 08/05/2018   CO2 25 08/05/2018   GLUCOSE 107 (H) 08/05/2018   BUN 15 08/05/2018   CREATININE 0.76 08/05/2018   CALCIUM 9.1 08/05/2018   GFRNONAA >60 08/05/2018   GFRAA >60 08/05/2018      Assessment and Plan  Recurrent c.difficile enteritis = recommend that she gets FMT. We have discussed the process and informed consent, and cost of procurement which she understands. She just started dificid course. Will  reach out to dr Vicente Males to schedule FMT. Speciality pharmacy for [place]. Need coordinate fmt.   IRB approval has been obtained.  Spent 40 min with patient counseling on next steps of management of cdifficile

## 2018-08-18 ENCOUNTER — Telehealth: Payer: Self-pay

## 2018-08-18 NOTE — Telephone Encounter (Signed)
1. If she is in severe pain- needs to go to the ER 2. We are likely going to start fecal transplants as per information I received last week , likely will start in 10-14 days time.  3. If she is going to ER then suggest [place] , if needs fecal transplant sooner [facility].

## 2018-08-18 NOTE — Telephone Encounter (Signed)
Patient has contacted office stated that she is experiencing intestinal pain, stomach aches severely.  She has been on Dificid since Tuesday, but said she is in so much pain and experiencing nausea and vomiting.  She has had her referral to the doctor in [place].  He said she does need  A fecal transplant.  She would like you to do her fecal transplant.  Please advise regarding pain, and fecal transplant.  Thanks [employer] Financial[employer]le

## 2018-08-18 NOTE — Telephone Encounter (Signed)
LVM for patient to call back. ?

## 2018-08-18 NOTE — Telephone Encounter (Signed)
Patient has been advised if her pain is severe she will need to go to [facility].  [facility].  Patient said she took nausea medicine an the pain has calmed down.  I offered her an appt to see you tomorrow but she declined and said she will ride it out.  Thanks [employer]le

## 2018-08-19 ENCOUNTER — Ambulatory Visit (INDEPENDENT_AMBULATORY_CARE_PROVIDER_SITE_OTHER): Payer: Managed Care, Other (non HMO) | Admitting: Family Medicine

## 2018-08-19 ENCOUNTER — Encounter: Payer: Self-pay | Admitting: Family Medicine

## 2018-08-19 VITALS — BP 158/92 | HR 80 | Temp 98.2°F | Ht 65.0 in | Wt 261.5 lb

## 2018-08-19 DIAGNOSIS — E1165 Type 2 diabetes mellitus with hyperglycemia: Secondary | ICD-10-CM

## 2018-08-19 DIAGNOSIS — E782 Mixed hyperlipidemia: Secondary | ICD-10-CM

## 2018-08-19 DIAGNOSIS — Z23 Encounter for immunization: Secondary | ICD-10-CM | POA: Diagnosis not present

## 2018-08-19 DIAGNOSIS — E1151 Type 2 diabetes mellitus with diabetic peripheral angiopathy without gangrene: Secondary | ICD-10-CM | POA: Diagnosis not present

## 2018-08-19 DIAGNOSIS — A0472 Enterocolitis due to Clostridium difficile, not specified as recurrent: Secondary | ICD-10-CM

## 2018-08-19 DIAGNOSIS — R5383 Other fatigue: Secondary | ICD-10-CM | POA: Diagnosis not present

## 2018-08-19 DIAGNOSIS — I70209 Unspecified atherosclerosis of native arteries of extremities, unspecified extremity: Secondary | ICD-10-CM

## 2018-08-19 DIAGNOSIS — E778 Other disorders of glycoprotein metabolism: Secondary | ICD-10-CM

## 2018-08-19 DIAGNOSIS — E559 Vitamin D deficiency, unspecified: Secondary | ICD-10-CM

## 2018-08-19 DIAGNOSIS — IMO0002 Reserved for concepts with insufficient information to code with codable children: Secondary | ICD-10-CM

## 2018-08-19 MED ORDER — PIOGLITAZONE HCL 45 MG PO TABS: 45.0000 mg | ORAL_TABLET | Freq: Every day | ORAL | 1 refills | Status: DC

## 2018-08-19 MED ORDER — LISINOPRIL 40 MG PO TABS: 40.0000 mg | ORAL_TABLET | Freq: Every day | ORAL | 1 refills | Status: DC

## 2018-08-19 MED ORDER — ONETOUCH DELICA LANCETS 33G MISC: 3 refills | Status: DC

## 2018-08-19 MED ORDER — INSULIN PEN NEEDLE 31G X 6 MM MISC: 3 refills | Status: DC

## 2018-08-19 NOTE — Assessment & Plan Note (Signed)
Foot exam by MD today; check A1c 

## 2018-08-19 NOTE — Addendum Note (Signed)
Addended by: Jenika Chiem, Janit BernMELINDA P on: 08/19/2018 11:26 AM   Modules accepted: Orders

## 2018-08-19 NOTE — Assessment & Plan Note (Signed)
Monitor lipids 

## 2018-08-19 NOTE — Progress Notes (Signed)
BP (!) 158/92   Pulse 80   Temp 98.2 F (36.8 C) (Oral)   Ht 5\' 5"  (1.651 m)   Wt 261 lb 8 oz (118.6 kg)   SpO2 98%   BMI 43.52 kg/m    Subjective:    Patient ID: Debra [name], female    DOB: January 01, 1961, 57 y.o.   MRN: [id number]  HPI: Debra [name] is a 57 y.o. female  Chief Complaint  Patient presents with  . Follow-up    still has c-diff waiting on fecal transplant    HPI Patient is here for follow-up  She has C diff and is being considered for a fecal transplant; going on since May; seeing specialist, GI and ID; on Dificid right now; just tired of this; feels fatigued, light-headed, dizzy  High cholesterol; taking medicine Lab Results  Component Value Date   CHOL 209 (H) 01/28/2018   HDL 46 (L) 01/28/2018   LDLCALC 122 (H) 01/28/2018   TRIG 264 (H) 01/28/2018   CHOLHDL 4.5 01/28/2018   Type 2 diabetes mellitus; on insulin, uncontrolled; no problems with feet; up and down; some under 100 and some over 200 Lab Results  Component Value Date   HGBA1C 7.6 (A) 05/18/2018   I asked about her mood with her chronic illness; taking paxil and she says that is enough  Vitamin D deficiency and on supplement  HTN; blood pressure up today but stressed  Depression screen [facility] 2/9 08/19/2018 08/17/2018 05/18/2018 04/22/2018 03/29/2018  Decreased Interest 0 0 0 0 0  Down, Depressed, Hopeless 3 0 2 0 0  PHQ - 2 Score 3 0 2 0 0  Altered sleeping 3 - 3 - -  Tired, decreased energy 3 - 3 - -  Change in appetite 3 - 2 - -  Feeling bad or failure about yourself  1 - 0 - -  Trouble concentrating 1 - 1 - -  Moving slowly or fidgety/restless 0 - 0 - -  Suicidal thoughts 0 - 0 - -  PHQ-9 Score 14 - 11 - -  Difficult doing work/chores Not difficult at all - Somewhat difficult - -    Relevant past medical, surgical, family and social history reviewed Past Medical History:  Diagnosis Date  . Anxiety   . Arthritis   . C. difficile colitis   . C. difficile colitis   . Depression   .  Diabetes mellitus without complication (HCC)    Type II  . Family history of adverse reaction to anesthesia    Mother - BP drops  . Head injury, closed, with brief LOC (HCC) 2011ish  . Hyperlipidemia   . Hypertension   . Insomnia   . Myalgia 03/29/2018   Due to statin  . Obesity   . Parkinson's disease (HCC)   . Seasonal allergies   . Tobacco abuse    Past Surgical History:  Procedure Laterality Date  . ABDOMINAL HYSTERECTOMY  2012  . BREAST BIOPSY Right   . CESAREAN SECTION  1992  . CHOLECYSTECTOMY  2013  . LUMBAR LAMINECTOMY/DECOMPRESSION MICRODISCECTOMY Left 09/10/2015   Procedure: Left L4-5 Foraminotomy/Left L5-S1 Diskectomy;  Surgeon: Hilda Lias, MD;  Location: [facility];  Service: Neurosurgery;  Laterality: Left;  Left L4-5 Foraminotomy/Left L5-S1 Diskectomy   Family History  Problem Relation Age of Onset  . COPD Mother   . Bipolar disorder Father   . Diabetes Mellitus II Father   . Bipolar disorder Brother   . Diabetes Mellitus II  Brother   . Bipolar disorder Brother    Social History   Tobacco Use  . Smoking status: Current Every Day Smoker    Packs/day: 0.50    Years: 24.00    Pack years: 12.00    Types: Cigarettes  . Smokeless tobacco: Never Used  Substance Use Topics  . Alcohol use: Yes    Alcohol/week: 0.0 standard drinks    Comment: occasional- rare  . Drug use: No    Interim medical history since last visit reviewed. Allergies and medications reviewed  Review of Systems Per HPI unless specifically indicated above     Objective:    BP (!) 158/92   Pulse 80   Temp 98.2 F (36.8 C) (Oral)   Ht 5\' 5"  (1.651 m)   Wt 261 lb 8 oz (118.6 kg)   SpO2 98%   BMI 43.52 kg/m   Wt Readings from Last 3 Encounters:  08/19/18 261 lb 8 oz (118.6 kg)  08/17/18 264 lb 12.8 oz (120.1 kg)  08/05/18 250 lb (113.4 kg)    Physical Exam  Constitutional: She appears well-developed and well-nourished. No distress.  HENT:  Head: Normocephalic and  atraumatic.  Eyes: EOM are normal. No scleral icterus.  Neck: No thyromegaly present.  Cardiovascular: Normal rate, regular rhythm and normal heart sounds.  No murmur heard. Pulmonary/Chest: Effort normal and breath sounds normal. No respiratory distress. She has no wheezes.  Abdominal: Soft. She exhibits no distension.  Musculoskeletal: She exhibits no edema.  Neurological: She is alert.  Skin: Skin is warm and dry. She is not diaphoretic. No pallor.  Psychiatric: She has a normal mood and affect. Her behavior is normal. Judgment and thought content normal.   Diabetic Foot Form - Detailed   Diabetic Foot Exam - detailed Diabetic Foot exam was performed with the following findings:  Yes 08/19/2018 10:32 AM  Visual Foot Exam completed.:  Yes  Pulse Foot Exam completed.:  Yes  Right Dorsalis Pedis:  Present Left Dorsalis Pedis:  Present  Sensory Foot Exam Completed.:  Yes Semmes-Weinstein Monofilament Test R Site 1-Great Toe:  Pos L Site 1-Great Toe:  Pos         Results for orders placed or performed during the hospital encounter of 08/05/18  Comprehensive metabolic panel  Result Value Ref Range   Sodium 139 135 - 145 mmol/L   Potassium 3.6 3.5 - 5.1 mmol/L   Chloride 104 98 - 111 mmol/L   CO2 25 22 - 32 mmol/L   Glucose, Bld 107 (H) 70 - 99 mg/dL   BUN 15 6 - 20 mg/dL   Creatinine, Ser 4.780.76 0.44 - 1.00 mg/dL   Calcium 9.1 8.9 - 29.510.3 mg/dL   Total Protein 6.3 (L) 6.5 - 8.1 g/dL   Albumin 3.7 3.5 - 5.0 g/dL   AST 20 15 - 41 U/L   ALT 20 0 - 44 U/L   Alkaline Phosphatase 57 38 - 126 U/L   Total Bilirubin 1.0 0.3 - 1.2 mg/dL   GFR calc non Af Amer >60 >60 mL/min   GFR calc Af Amer >60 >60 mL/min   Anion gap 10 5 - 15  CBC with Differential  Result Value Ref Range   WBC 9.7 4.0 - 10.5 K/uL   RBC 4.40 3.87 - 5.11 MIL/uL   Hemoglobin 14.3 12.0 - 15.0 g/dL   HCT 62.142.1 30.836.0 - 65.746.0 %   MCV 95.7 78.0 - 100.0 fL   MCH 32.5 26.0 - 34.0 pg  MCHC 34.0 30.0 - 36.0 g/dL   RDW  47.8 29.5 - 62.1 %   Platelets 266 150 - 400 K/uL   Neutrophils Relative % 56 %   Neutro Abs 5.4 1.7 - 7.7 K/uL   Lymphocytes Relative 32 %   Lymphs Abs 3.1 0.7 - 4.0 K/uL   Monocytes Relative 7 %   Monocytes Absolute 0.6 0.1 - 1.0 K/uL   Eosinophils Relative 5 %   Eosinophils Absolute 0.4 0.0 - 0.7 K/uL   Basophils Relative 1 %   Basophils Absolute 0.1 0.0 - 0.1 K/uL   Immature Granulocytes 1 %   Abs Immature Granulocytes 0.1 0.0 - 0.1 K/uL      Assessment & Plan:   Problem List Items Addressed This Visit      Cardiovascular and Mediastinum   Uncontrolled diabetes mellitus type 2 with atherosclerosis of arteries of extremities (HCC) - Primary    Foot exam by MD today; check A1c      Relevant Medications   pioglitazone (ACTOS) 45 MG tablet   lisinopril (PRINIVIL,ZESTRIL) 40 MG tablet   Other Relevant Orders   Microalbumin / creatinine urine ratio   Lipid panel   Hemoglobin A1c     Digestive   C. difficile colitis    Under the care of specialist; being considered for fecal transplant        Other   Morbid obesity (HCC)    Encouraged weight loss      Relevant Medications   pioglitazone (ACTOS) 45 MG tablet   Hyperlipidemia    Monitor lipids      Relevant Medications   lisinopril (PRINIVIL,ZESTRIL) 40 MG tablet    Other Visit Diagnoses    Other fatigue       Relevant Orders   Magnesium   Vitamin B12   Need for influenza vaccination       Relevant Orders   Flu Vaccine QUAD 6+ mos PF IM (Fluarix Quad PF) (Completed)   Vitamin D deficiency       Relevant Orders   VITAMIN D 25 Hydroxy (Vit-D Deficiency, Fractures)   Hypoproteinemia (HCC)       Relevant Orders   COMPLETE METABOLIC PANEL WITH GFR       Follow up plan: Return in about 3 months (around 11/18/2018).  An after-visit summary was printed and given to the patient at check-out.  Please see the patient instructions which may contain other information and recommendations beyond what is mentioned  above in the assessment and plan.  Meds ordered this encounter  Medications  . pioglitazone (ACTOS) 45 MG tablet    Sig: Take 1 tablet (45 mg total) by mouth daily.    Dispense:  90 tablet    Refill:  1  . lisinopril (PRINIVIL,ZESTRIL) 40 MG tablet    Sig: Take 1 tablet (40 mg total) by mouth daily.    Dispense:  90 tablet    Refill:  1  . ONETOUCH DELICA LANCETS 33G MISC    Sig: Check fingerstick blood sugars four times a day; E11.65, LON 99 months    Dispense:  300 each    Refill:  3  . Insulin Pen Needle 31G X 6 MM MISC    Sig: For use with insulin injections, up to four injections daily    Dispense:  300 each    Refill:  3    Orders Placed This Encounter  Procedures  . Flu Vaccine QUAD 6+ mos PF IM (Fluarix Quad PF)  .  Microalbumin / creatinine urine ratio  . Lipid panel  . Hemoglobin A1c  . COMPLETE METABOLIC PANEL WITH GFR  . Magnesium  . VITAMIN D 25 Hydroxy (Vit-D Deficiency, Fractures)  . Vitamin B12

## 2018-08-19 NOTE — Assessment & Plan Note (Signed)
Encouraged weight loss 

## 2018-08-19 NOTE — Assessment & Plan Note (Signed)
Under the care of specialist; being considered for fecal transplant

## 2018-08-19 NOTE — Patient Instructions (Signed)
Check out the information at familydoctor.org entitled "Nutrition for Weight Loss: What You Need to Know about Fad Diets" Try to lose between 1-2 pounds per week by taking in fewer calories and burning off more calories You can succeed by limiting portions, limiting foods dense in calories and fat, becoming more active, and drinking 8 glasses of water a day (64 ounces) Don't skip meals, especially breakfast, as skipping meals may alter your metabolism Do not use over-the-counter weight loss pills or gimmicks that claim rapid weight loss A healthy BMI (or body mass index) is between 18.5 and 24.9 You can calculate your ideal BMI at the NIH website [employer].huhttp://www.nhlbi.nih.gov/health/educational/lose_wt/BMI/bmicalc.htm If you have not heard anything from my staff in a week about any orders/referrals/studies from today, please contact us here to follow-up [phone] Try to limit saturated fats in your diet (bologna, hot dogs, barbeque, cheeseburgers, hamburgers, steak, bacon, sausage, cheese, etc.) and get more fresh fruits, vegetables, and whole grains

## 2018-08-20 LAB — COMPLETE METABOLIC PANEL WITH GFR
AG RATIO: 1.8 (calc) (ref 1.0–2.5)
ALT: 16 U/L (ref 6–29)
AST: 15 U/L (ref 10–35)
Albumin: 4.2 g/dL (ref 3.6–5.1)
Alkaline phosphatase (APISO): 61 U/L (ref 33–130)
BILIRUBIN TOTAL: 0.5 mg/dL (ref 0.2–1.2)
BUN: 17 mg/dL (ref 7–25)
CALCIUM: 10 mg/dL (ref 8.6–10.4)
CHLORIDE: 102 mmol/L (ref 98–110)
CO2: 24 mmol/L (ref 20–32)
Creat: 0.82 mg/dL (ref 0.50–1.05)
GFR, EST AFRICAN AMERICAN: 93 mL/min/{1.73_m2} (ref >=60)
GFR, EST NON AFRICAN AMERICAN: 81 mL/min/{1.73_m2} (ref >=60)
GLOBULIN: 2.4 g/dL (ref 1.9–3.7)
Glucose, Bld: 208 mg/dL — ABNORMAL HIGH (ref 65–139)
POTASSIUM: 4.2 mmol/L (ref 3.5–5.3)
SODIUM: 139 mmol/L (ref 135–146)
TOTAL PROTEIN: 6.6 g/dL (ref 6.1–8.1)

## 2018-08-20 LAB — LIPID PANEL
CHOL/HDL RATIO: 5.2 (calc) — AB (ref <5.0)
CHOLESTEROL: 229 mg/dL — AB (ref <200)
HDL: 44 mg/dL — ABNORMAL LOW (ref >50)
LDL CHOLESTEROL (CALC): 150 mg/dL — AB
NON-HDL CHOLESTEROL (CALC): 185 mg/dL — AB (ref <130)
Triglycerides: 208 mg/dL — ABNORMAL HIGH (ref <150)

## 2018-08-20 LAB — HEMOGLOBIN A1C
EAG (MMOL/L): 10.3 (calc)
Hgb A1c MFr Bld: 8.1 % of total Hgb — ABNORMAL HIGH (ref <5.7)
MEAN PLASMA GLUCOSE: 186 (calc)

## 2018-08-20 LAB — VITAMIN D 25 HYDROXY (VIT D DEFICIENCY, FRACTURES): VIT D 25 HYDROXY: 39 ng/mL (ref 30–100)

## 2018-08-20 LAB — MAGNESIUM: MAGNESIUM: 1.8 mg/dL (ref 1.5–2.5)

## 2018-08-23 ENCOUNTER — Telehealth: Payer: Self-pay

## 2018-08-23 NOTE — Telephone Encounter (Signed)
Message relayed to Dr Snider. AndDrue Secondree CossHowell, Khala Tarte M, RN

## 2018-08-23 NOTE — Telephone Encounter (Signed)
Copied from CRM [phone]. Topic: Quick Communication - See Telephone Encounter >> Aug 23, 2018 11:40 AM Herby AbrahamJohnson, Shiquita C wrote: CRM for notification. See Telephone encounter for: 08/23/18.  Pt called in requesting a call back from CMA.   CB: [phone]

## 2018-08-23 NOTE — Telephone Encounter (Signed)
I'm going to leave that up to GI in regards to how soon she needs to be seen

## 2018-08-23 NOTE — Telephone Encounter (Signed)
I have contacted Dr. Feliz BeamSnider's office and left message for her to contact me or Dr. Tobi BastosAnna in regards to moving forward with [place] stool [place] this week. As Endo unit does not have the official "sign off" yet.

## 2018-08-23 NOTE — Telephone Encounter (Signed)
Debra [name]- can we get her for the stool transplant end of this week ? I am free to do it, I can do it afternoon if required on Friday or can come in early on any other day .  C/c Debra [name] , Dr Sherie DonLada and Boyd KerbsPenny (endoscopy manager)

## 2018-08-23 NOTE — Telephone Encounter (Signed)
Pt states [facility] cannot get her in until oct 28 for an appt? Please advise do you want her in sooner?

## 2018-08-24 ENCOUNTER — Other Ambulatory Visit: Payer: Self-pay

## 2018-08-24 ENCOUNTER — Other Ambulatory Visit
Admission: RE | Admit: 2018-08-24 | Discharge: 2018-08-24 | Disposition: A | Discharge status: Home / Self Care | Payer: Managed Care, Other (non HMO) | Source: Ambulatory Visit | Attending: Gastroenterology | Admitting: Gastroenterology

## 2018-08-24 ENCOUNTER — Encounter: Payer: Self-pay | Admitting: Family Medicine

## 2018-08-24 DIAGNOSIS — A0472 Enterocolitis due to Clostridium difficile, not specified as recurrent: Secondary | ICD-10-CM | POA: Insufficient documentation

## 2018-08-24 NOTE — Telephone Encounter (Signed)
She is scheduled to get fecal transplant at [facility] on friday

## 2018-08-24 NOTE — Progress Notes (Signed)
GI

## 2018-08-25 ENCOUNTER — Other Ambulatory Visit: Payer: Self-pay

## 2018-08-25 ENCOUNTER — Other Ambulatory Visit
Admission: RE | Admit: 2018-08-25 | Discharge: 2018-08-25 | Disposition: A | Discharge status: Home / Self Care | Payer: Managed Care, Other (non HMO) | Source: Ambulatory Visit | Attending: Gastroenterology | Admitting: Gastroenterology

## 2018-08-25 DIAGNOSIS — A0472 Enterocolitis due to Clostridium difficile, not specified as recurrent: Secondary | ICD-10-CM | POA: Insufficient documentation

## 2018-08-25 LAB — RPR: RPR: NONREACTIVE

## 2018-08-25 LAB — HEPATITIS B SURFACE ANTIGEN: Hepatitis B Surface Ag: NEGATIVE

## 2018-08-25 LAB — HEPATITIS A ANTIBODY, TOTAL: HEP A TOTAL AB: NEGATIVE

## 2018-08-26 ENCOUNTER — Encounter: Admission: RE | Payer: Self-pay | Source: Ambulatory Visit

## 2018-08-26 ENCOUNTER — Ambulatory Visit
Admission: RE | Admit: 2018-08-26 | Payer: Managed Care, Other (non HMO) | Source: Ambulatory Visit | Admitting: Gastroenterology

## 2018-08-26 ENCOUNTER — Telehealth: Payer: Self-pay

## 2018-08-26 LAB — GASTROINTESTINAL PANEL BY PCR, STOOL (REPLACES STOOL CULTURE)
ASTROVIRUS: NOT DETECTED
Adenovirus F40/41: NOT DETECTED
CYCLOSPORA CAYETANENSIS: NOT DETECTED
Campylobacter species: NOT DETECTED
Cryptosporidium: NOT DETECTED
ENTEROTOXIGENIC E COLI (ETEC): NOT DETECTED
Entamoeba histolytica: NOT DETECTED
Enteroaggregative E coli (EAEC): NOT DETECTED
Enteropathogenic E coli (EPEC): NOT DETECTED
Giardia lamblia: NOT DETECTED
Norovirus GI/GII: NOT DETECTED
Plesimonas shigelloides: NOT DETECTED
Rotavirus A: NOT DETECTED
SAPOVIRUS (I, II, IV, AND V): NOT DETECTED
Salmonella species: DETECTED — AB
Shiga like toxin producing E coli (STEC): NOT DETECTED
Shigella/Enteroinvasive E coli (EIEC): NOT DETECTED
Vibrio cholerae: NOT DETECTED
Vibrio species: NOT DETECTED
YERSINIA ENTEROCOLITICA: NOT DETECTED

## 2018-08-26 SURGERY — FECAL MICROBIOTA TRANSFER
Anesthesia: General

## 2018-08-26 MED ORDER — ZITHROMAX 500 MG PO TABS: ORAL_TABLET | ORAL | 0 refills | Status: DC

## 2018-08-26 NOTE — Telephone Encounter (Signed)
Spoke with pt, informed her of lab results and Dr. Johnney KillianAnna's instructions to schedule an office visit with him to discuss. Appointment has been scheduled.

## 2018-08-26 NOTE — Telephone Encounter (Signed)
-----   Message from Wyline MoodKiran Anna, MD sent at 08/26/2018 10:39 AM EDT ----- Regarding: appt Schedule her to see me in 10-14 days from today

## 2018-08-26 NOTE — Telephone Encounter (Signed)
She came up today for a stool transplant.  GI PCR from yesterday was positive for Salmonella.  Discussed with Dr. Ilsa IhaSnyder decided to abort FMT and treated for her Salmonella for 7 days with Z-Pak.  I will see her back in a week's time to determine the timing of her FMT

## 2018-08-27 ENCOUNTER — Encounter: Payer: Self-pay | Admitting: Family Medicine

## 2018-09-13 ENCOUNTER — Telehealth: Payer: Self-pay

## 2018-09-13 ENCOUNTER — Telehealth: Payer: Self-pay | Admitting: Gastroenterology

## 2018-09-13 NOTE — Telephone Encounter (Signed)
Spoke with Debra [name] regarding condition and concerns. Debra [name] states she had a severe  episode of diarrhea over night but has not experienced again since. Debra [name] states she feels better and will follow up with Dr. Tobi Bastos at her next scheduled office visit.

## 2018-09-13 NOTE — Telephone Encounter (Signed)
-----   Message from Kiran Anna, MD sent at 09/13/2018  9:19 AM EDT ----- Chaunte Hornbeck Heard she called last night , Dr Wohl was on call, apparently not feeling well   Check   1 . Diarrhea yes or no 2. How many times 3. If has diarrhea needs repeat GI pcr and C diff testing 4. Enquire if having any abdominal pains  

## 2018-09-13 NOTE — Telephone Encounter (Signed)
Called pt regarding pt condition and concerns as instructed by Dr. Tobi Bastos LVM to return call

## 2018-09-13 NOTE — Telephone Encounter (Signed)
Patient call in returning called Debra [name]). Patient states she called and spoke to the MD  on call last night with terrible diarrhea, but today it is gone. She feels it was from something she ate yesterday.

## 2018-09-13 NOTE — Telephone Encounter (Signed)
-----   Message from [name], MD sent at 09/13/2018  9:19 AM EDT ----- Si Raider she called last night , Dr Servando Snare was on call, apparently not feeling well   Check   1 . Diarrhea yes or no 2. How many times 3. If has diarrhea needs repeat GI pcr and C diff testing 4. Enquire if having any abdominal pains

## 2018-09-15 ENCOUNTER — Ambulatory Visit (INDEPENDENT_AMBULATORY_CARE_PROVIDER_SITE_OTHER): Payer: Managed Care, Other (non HMO) | Admitting: Gastroenterology

## 2018-09-15 ENCOUNTER — Encounter: Payer: Self-pay | Admitting: Gastroenterology

## 2018-09-15 VITALS — BP 117/73 | HR 84 | Ht 65.0 in | Wt 264.8 lb

## 2018-09-15 DIAGNOSIS — E1151 Type 2 diabetes mellitus with diabetic peripheral angiopathy without gangrene: Secondary | ICD-10-CM

## 2018-09-15 DIAGNOSIS — A029 Salmonella infection, unspecified: Secondary | ICD-10-CM

## 2018-09-15 DIAGNOSIS — A0472 Enterocolitis due to Clostridium difficile, not specified as recurrent: Secondary | ICD-10-CM

## 2018-09-15 DIAGNOSIS — I70209 Unspecified atherosclerosis of native arteries of extremities, unspecified extremity: Secondary | ICD-10-CM

## 2018-09-15 DIAGNOSIS — E1165 Type 2 diabetes mellitus with hyperglycemia: Secondary | ICD-10-CM

## 2018-09-15 NOTE — Progress Notes (Signed)
[name] MD, MRCP(U.K) [address]  [place]  [place], [place] [address]  Main: [phone]  Fax: [phone]   Primary Care Physician: Arnetha Courser, MD  Primary Gastroenterologist:  Dr. [name]   Chief Complaint  Patient presents with  . Follow up C diff    HPI: Debra [name] is a 57 y.o. female    Summary of history :  She is here today to see to see me for a history of recurrencr C diff diarrhea. She has had a total of 3 episodes of C. difficile diarrhea.  Recently discharged on 05/12/2018 with vancomycin 125 mg 4 times a day will gradually tapered overa period of weeks.  The diarrhea recurred after cessation of vancomycin in July 2019.   Interval history8/04/2018-09/15/18    Treated subsequently with a course of dificid - towards the end of the course developed severe diarrhea - plan for stool transplant , seen by Dr Baxter Flattery at [facility] as we were yet waiting for approval for FMT. She was subsequently scheduled for FMT at out hospital on 08/26/18. On the day of the procedure her Stool GI pcr returned with salmonella and we cancelled the [place] and treated her with a course of azithromycin.    Since her treatment with Azithromycin her stools have returned to normal and are formed. She had 1 episode of diarrhea last week after eating a potato salad with a lot of cream which she attributes to her lactose intolernace. She says presently she is doing great.    Current Outpatient Medications  Medication Sig Dispense Refill  . aspirin EC 81 MG tablet Take 81 mg by mouth daily.    . blood glucose meter kit and supplies Use up to four times daily as directed, Dx E11.65, LON 99 months 1 each 0  . calcium citrate-vitamin D (CITRACAL+D) 315-200 MG-UNIT tablet Take 1 tablet by mouth 2 (two) times daily.    . Cholecalciferol 5000 units capsule Take 5,000 Units by mouth.     [name] DIFICID 200 MG TABS tablet Take 200 mg by mouth 2 (two) times daily.     . folic acid  (FOLVITE) 373 MCG tablet Take 800 mcg by mouth daily.     [name] glucose blood (ONE TOUCH ULTRA TEST) test strip USE UP TO 4 TIMES A DAY AS DIRECTED DX: E11.65 300 each 0  . HUMALOG KWIKPEN 100 UNIT/ML KiwkPen Inject into the skin. Give subcutaneously with meals, 20 units in the AM and 20 units with evening meal    . Insulin Pen Needle 31G X 6 MM MISC For use with insulin injections, up to four injections daily 300 each 3  . lisinopril (PRINIVIL,ZESTRIL) 40 MG tablet Take 1 tablet (40 mg total) by mouth daily. 90 tablet 1  . Magnesium 400 MG CAPS Take 250 mg by mouth daily.     . Melatonin 10 MG TABS Take 1 tablet by mouth at bedtime.    . Multiple Vitamins-Minerals (CENTRUM WOMEN) TABS Take 1 tablet by mouth.    Glory Rosebush DELICA LANCETS 42A MISC Check fingerstick blood sugars four times a day; E11.65, LON 99 months 300 each 3  . PARoxetine (PAXIL) 40 MG tablet Take 1 tablet (40 mg total) by mouth every morning. 90 tablet 1  . pioglitazone (ACTOS) 45 MG tablet Take 1 tablet (45 mg total) by mouth daily. 90 tablet 1  . propranolol ER (INDERAL LA) 80 MG 24 hr capsule TAKE 1 CAPSULE (80 MG  TOTAL) BY MOUTH ONCE DAILY  3  . TRESIBA FLEXTOUCH 200 UNIT/ML SOPN Inject 26 Units into the skin at bedtime. (Patient taking differently: Inject 110 Units into the skin at bedtime. )    . ZITHROMAX 500 MG tablet Take 1000 mg on day 1 followed by  500 mg daily for a total of 7 days 8 tablet 0  . potassium chloride SA (KLOR-CON M20) 20 MEQ tablet Take 1 tablet (20 mEq total) by mouth daily. (Patient not taking: Reported on 08/19/2018) 14 tablet 0   No current facility-administered medications for this visit.     Allergies as of 09/15/2018 - Review Complete 09/15/2018  Allergen Reaction Noted  . Metformin and related Diarrhea 07/22/2015  . Benadryl [diphenhydramine hcl] Swelling 05/22/2011  . Canagliflozin Other (See Comments) 05/06/2016  . Depakote er [divalproex sodium er] Other (See Comments) 05/29/2015  .  Dilaudid [hydromorphone hcl] Other (See Comments) 05/29/2015  . Diphenhydramine-zinc acetate Other (See Comments) 12/11/2015  . Haloperidol Other (See Comments) 12/11/2015  . Hydromorphone Other (See Comments) 12/11/2015  . Neosporin [neomycin-bacitracin zn-polymyx] Swelling 05/29/2015  . Singulair [montelukast sodium] Other (See Comments) 05/29/2015  . Sitagliptin Other (See Comments) 12/11/2015  . Statins Other (See Comments) 02/23/2014  . Sulfa antibiotics Hives 02/23/2014  . Victoza [liraglutide] Other (See Comments) and Nausea And Vomiting 02/23/2014    ROS:  General: Negative for anorexia, weight loss, fever, chills, fatigue, weakness. ENT: Negative for hoarseness, difficulty swallowing , nasal congestion. CV: Negative for chest pain, angina, palpitations, dyspnea on exertion, peripheral edema.  Respiratory: Negative for dyspnea at rest, dyspnea on exertion, cough, sputum, wheezing.  GI: See history of present illness. GU:  Negative for dysuria, hematuria, urinary incontinence, urinary frequency, nocturnal urination.  Endo: Negative for unusual weight change.    Physical Examination:   BP 117/73   Pulse 84   Ht _0  (1.651 m)   Wt 264 lb 12.8 oz (120.1 kg)   BMI 44.07 kg/m   General: Well-nourished, well-developed in no acute distress.  Eyes: No icterus. Conjunctivae pink. Mouth: Oropharyngeal mucosa moist and pink , no lesions erythema or exudate. Lungs: Clear to auscultation bilaterally. Non-labored. Heart: Regular rate and rhythm, no murmurs rubs or gallops.  Abdomen: Bowel sounds are normal, nontender, nondistended, no hepatosplenomegaly or masses, no abdominal bruits or hernia , no rebound or guarding.   Extremities: No lower extremity edema. No clubbing or deformities. Neuro: Alert and oriented x 3.  Grossly intact. Skin: Warm and dry, no jaundice.   Psych: Alert and cooperative, normal mood and affect.   Imaging Studies: No results found.  Assessment and  Plan:   Debra [name] is a 57 y.o. y/o female here to follow up for recurrent C diff colitis, 3- 4 rounds so far. Last episode of diarrhea initially felt to be C diff but stool PCR returned with salmonella. Treated with Azithromycin and her diarrhea has resolved.   Dr [name]  MD,MRCP [facility]) Follow up in PRN

## 2018-10-04 ENCOUNTER — Encounter: Payer: Self-pay | Admitting: Family Medicine

## 2018-10-09 MED ORDER — PIOGLITAZONE HCL 30 MG PO TABS: 30.0000 mg | ORAL_TABLET | Freq: Every day | ORAL | 3 refills | Status: DC

## 2018-10-19 ENCOUNTER — Ambulatory Visit: Payer: Managed Care, Other (non HMO) | Admitting: Internal Medicine

## 2018-11-09 ENCOUNTER — Encounter: Payer: Managed Care, Other (non HMO) | Attending: Internal Medicine | Admitting: Internal Medicine

## 2018-11-09 DIAGNOSIS — G20 Parkinson's disease: Secondary | ICD-10-CM | POA: Diagnosis not present

## 2018-11-09 DIAGNOSIS — I10 Essential (primary) hypertension: Secondary | ICD-10-CM | POA: Insufficient documentation

## 2018-11-09 DIAGNOSIS — Z79899 Other long term (current) drug therapy: Secondary | ICD-10-CM | POA: Insufficient documentation

## 2018-11-09 DIAGNOSIS — J449 Chronic obstructive pulmonary disease, unspecified: Secondary | ICD-10-CM | POA: Insufficient documentation

## 2018-11-09 DIAGNOSIS — Z882 Allergy status to sulfonamides status: Secondary | ICD-10-CM | POA: Insufficient documentation

## 2018-11-09 DIAGNOSIS — E11622 Type 2 diabetes mellitus with other skin ulcer: Secondary | ICD-10-CM | POA: Insufficient documentation

## 2018-11-09 DIAGNOSIS — F17210 Nicotine dependence, cigarettes, uncomplicated: Secondary | ICD-10-CM | POA: Insufficient documentation

## 2018-11-09 DIAGNOSIS — L02211 Cutaneous abscess of abdominal wall: Secondary | ICD-10-CM | POA: Insufficient documentation

## 2018-11-09 DIAGNOSIS — Z885 Allergy status to narcotic agent status: Secondary | ICD-10-CM | POA: Diagnosis not present

## 2018-11-09 DIAGNOSIS — Z888 Allergy status to other drugs, medicaments and biological substances status: Secondary | ICD-10-CM | POA: Diagnosis not present

## 2018-11-09 DIAGNOSIS — Z794 Long term (current) use of insulin: Secondary | ICD-10-CM | POA: Insufficient documentation

## 2018-11-09 DIAGNOSIS — F319 Bipolar disorder, unspecified: Secondary | ICD-10-CM | POA: Diagnosis not present

## 2018-11-09 DIAGNOSIS — L02214 Cutaneous abscess of groin: Secondary | ICD-10-CM | POA: Diagnosis not present

## 2018-11-12 NOTE — Progress Notes (Signed)
ALESANA, MAGISTRO ([id number]) Visit Report for 11/09/2018 Chief Complaint Document Details Patient Name: Debra [name], Debra [name]. Date of Service: 11/09/2018 1:00 PM Medical Record Number: [id number] Patient Account Number: [id number] Date of Birth/Sex: 04-02-1961 (57 y.o. F) Treating RN: Huel Coventry Primary Care Provider: Baruch Gouty Other Clinician: Referring Provider: Baruch Gouty Treating Provider/Extender: Altamese [place] in Treatment: 0 Information Obtained from: Patient Chief Complaint 03/30/18; patient is here for review of an abscess site in the left lower abdominal quadrant 11/09/18; patient returns to clinic for review of a small open area just above her symphysis pubis in the lower abdomen. This was apparently the result of a cutaneous abscess that ruptured 2 weeks ago Electronic Signature(s) Signed: 11/09/2018 4:50:38 PM By: Baltazar Najjar MD Entered By: Baltazar Najjar on 11/09/2018 14:28:55 Debra [name] ([id number]) -------------------------------------------------------------------------------- Debridement Details Patient Name: Debra [name]. Date of Service: 11/09/2018 1:00 PM Medical Record Number: [id number] Patient Account Number: [id number] Date of Birth/Sex: 31-Jul-1961 (57 y.o. F) Treating RN: Huel Coventry Primary Care Provider: Baruch Gouty Other Clinician: Referring Provider: Baruch Gouty Treating Provider/Extender: Altamese [place] in Treatment: 0 Debridement Performed for Wound #2 Left Abdomen - Lower Quadrant Assessment: Performed By: Physician Maxwell Caul, MD Debridement Type: Chemical/Enzymatic/Mechanical Agent Used: saline and gauze Level of Consciousness (Pre- Awake and Alert procedure): Pre-procedure Verification/Time Yes - 13:30 Out Taken: Start Time: 13:31 Pain Control: Lidocaine Instrument: Other : saline and gauze Bleeding: Minimum Hemostasis Achieved: Pressure End Time: 13:32 Procedural Pain: 1 Post Procedural Pain: 1 Response to  Treatment: Procedure was tolerated well Level of Consciousness Awake and Alert (Post-procedure): Post Debridement Measurements of Total Wound Length: (cm) 0.5 Width: (cm) 1.2 Depth: (cm) 0.4 Volume: (cm) 0.188 Character of Wound/Ulcer Post Debridement: Stable Post Procedure Diagnosis Same as Pre-procedure Electronic Signature(s) Signed: 11/09/2018 4:50:38 PM By: Baltazar Najjar MD Signed: 11/09/2018 6:02:29 PM By: Elliot Gurney, BSN, RN, CWS, Arohi RN, BSN Previous Signature: 11/09/2018 2:09:52 PM Version By: Elliot Gurney, BSN, RN, CWS, Rene RN, BSN Entered By: Baltazar Najjar on 11/09/2018 14:28:21 Debra [name] ([id number]) -------------------------------------------------------------------------------- HPI Details Patient Name: Debra [name]. Date of Service: 11/09/2018 1:00 PM Medical Record Number: [id number] Patient Account Number: [id number] Date of Birth/Sex: 05/08/1961 (57 y.o. 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 She tells me everything was fine up until a week ago. She noticed pain in her left groin area. By Saturday this it started to drain. She applied black salve to this area which she has done in the past and is helped heal problems in this area. This did not work. She was seen in the ER on 03/27/18 with an abscess in the left groin. Noted to have surrounding cellulitis and an indurated area. She underwent an IandD. She was started on patient clindamycin. Her white count was 11.9. Comprehensive metabolic panel normal lactic acid level normal at 1.2. They did not do a culture that I can see. She was seen yesterday at her primary physician's office she may have been put on cephalexin at that  point. She is using iodoform packing The patient is a type II diabetic with a history of PAD last hemoglobin A1c of 7.3. She has a history of parkinsonism, COPD, bipolar disorder hypertension. She is a continued 1  pack per day smoker She tells me that she does have a history of recurrent abscesses in this area last about a year ago. Mostly she seems to care for these herself and they seem to go away. 04/06/18; patient's surgical IandD on the lower left abdominal quadrant just above the symphysis pubis appears better. Following is better we've been using silver alginate. Culture I did last week did not grow MRSA rather Enterococcus faecalis. I substituted Augmentin for the clindamycin however the patient didn't have enough money, however she will pick it up this morning 04/13/18; surgical IandD on the lower left abdominal quadrant just above the symphysis pubis. Surface of this area looks healthy. This is an oval-shaped wound lying horizontally. Medially it has A, all with about 0.9 cm in depth there is no drainage no surrounding erythema. She is completing the Augmentin I gave her last week 04/27/18; surgical IandD site on the lower left abdominal quadrant just above the symphysis pubis. Surface of this wound is improved dimensions are better. We've been using silver alginate and border foam. She tells me she developed C. difficile has been treated with oral vancomycin. Will need to be very judicious with any further antibiotics 05/04/18; surgical IandD site on the lower left abdomen just above the symphysis pubis. All of this is closed over except a small slit like area which is probably where the sinus tract was. There is no drainage no tenderness. 05/11/18; surgical IandD site on the left lower abdomen just above the symphysis pubis. All of this is closed. There is no open area here. No drainage and no tenderness she does have a history of abscesses but apparently none that have had difficulty healing  like this one READMISSION 11/09/18 This is a patient we know from previous stays in this clinic. She apparently has a history of recurrent Cutaneous abscesses. We had her in the clinic last time with a lower abdomen/inguinal area abscess that required a surgical IandD. We managed to get this to close over. She also lives in fear of recurrent C. difficile and does not want systemic antibiotics. She tells [place] that she developed a small abscess in the wound area about 2-3 weeks ago. This spontaneously ruptured and had purulent material maintained. She is here for our review of the resultant wound Electronic Signature(s) Signed: 11/09/2018 4:50:38 PM By: Baltazar Najjar MD Entered By: Baltazar Najjar on 11/09/2018 14:30:45 Debra [name] ([id number]) -------------------------------------------------------------------------------- Physical Exam Details Patient Name: Debra [name] Date of Service: 11/09/2018 1:00 PM Medical Record Number: [id number] Patient Account Number: [id number] Date of Birth/Sex: 19-Feb-1961 (57 y.o. F) Treating RN: Huel Coventry Primary Care Provider: Baruch Gouty Other Clinician: Referring Provider: Baruch Gouty Treating Provider/Extender: Altamese [place] in Treatment: 0 Constitutional Sitting or standing Blood Pressure is within target range for patient.. Pulse regular and within target range for patient.[name] Respirations regular, non-labored and within target range.. Temperature is normal and within the target range for the patient.[name] appears in no distress. Respiratory Respiratory effort is easy and symmetric bilaterally. Rate is normal at rest and on room air.. Bilateral breath sounds are clear and equal in all lobes with no wheezes, rales or rhonchi.. Cardiovascular Heart rhythm and rate regular, without murmur or gallop.. Gastrointestinal (GI) Obese but no tenderness no masses. No liver or spleen enlargement or tenderness.. Integumentary (Hair, Skin) No primary  skin issues are seen. Notes Wound exam; the area in question is in the lower midabdomen just above her symphysis pubis. It also happens  to be in the crease of her overlying abdominal pannus. The wound at this point appears healthy probably 3 mm in depth. There is no surrounding soft tissue cellulitis no evidence of infection. The wound required vigorous debridement with normal saline and gauze. Electronic Signature(s) Signed: 11/09/2018 4:50:38 PM By: Baltazar Najjar MD Entered By: Baltazar Najjar on 11/09/2018 14:32:33 Debra [name] ([id number]) -------------------------------------------------------------------------------- Physician Orders Details Patient Name: Debra [name] Date of Service: 11/09/2018 1:00 PM Medical Record Number: [id number] Patient Account Number: [id number] Date of Birth/Sex: 02/04/1961 (57 y.o. F) Treating RN: Huel Coventry Primary Care Provider: Baruch Gouty Other Clinician: Referring Provider: Baruch Gouty Treating Provider/Extender: Altamese [place] in Treatment: 0 Verbal / Phone Orders: No Diagnosis Coding Wound Cleansing Wound #2 Left Abdomen - Lower Quadrant o Clean wound with Normal Saline. Anesthetic (add to Medication List) Wound #2 Left Abdomen - Lower Quadrant o Topical Lidocaine 4% cream applied to wound bed prior to debridement (In Clinic Only). Primary Wound Dressing Wound #2 Left Abdomen - Lower Quadrant o Silver Alginate Secondary Dressing Wound #2 Left Abdomen - Lower Quadrant o Boardered Foam Dressing Dressing Change Frequency Wound #2 Left Abdomen - Lower Quadrant o Change dressing every other day. Follow-up Appointments Wound #2 Left Abdomen - Lower Quadrant o Return Appointment in 1 week. Electronic Signature(s) Signed: 11/09/2018 4:50:38 PM By: Baltazar Najjar MD Signed: 11/09/2018 6:02:29 PM By: Elliot Gurney, BSN, RN, CWS, Doranne RN, BSN Entered By: Elliot Gurney, BSN, RN, CWS, Chryl on 11/09/2018 13:51:46 Debra [name]  ([id number]) -------------------------------------------------------------------------------- Problem List Details Patient Name: SHARNISE, BLOUGH. Date of Service: 11/09/2018 1:00 PM Medical Record Number: [id number] Patient Account Number: [id number] Date of Birth/Sex: [birth year] (57 y.o. F) Treating RN: Huel Coventry Primary Care Provider: Baruch Gouty Other Clinician: Referring Provider: Baruch Gouty Treating Provider/Extender: Altamese [place] in Treatment: 0 Active Problems ICD-10 Evaluated Encounter Code Description Active Date Today Diagnosis S31.109S Unspecified open wound of abdominal wall, unspecified 11/09/2018 No Yes quadrant without penetration into peritoneal cavity, sequela L02.211 Cutaneous abscess of abdominal wall 11/09/2018 No Yes E11.622 Type 2 diabetes mellitus with other skin ulcer 11/09/2018 No Yes Inactive Problems Resolved Problems Electronic Signature(s) Signed: 11/09/2018 4:50:38 PM By: Baltazar Najjar MD Entered By: Baltazar Najjar on 11/09/2018 14:26:53 Debra [name] ([id number]) -------------------------------------------------------------------------------- Progress Note Details Patient Name: Debra [name] Date of Service: 11/09/2018 1:00 PM Medical Record Number: [id number] Patient Account Number: [id number] Date of Birth/Sex: March 22, 1961 (57 y.o. F) Treating RN: Huel Coventry Primary Care Provider: Baruch Gouty Other Clinician: Referring Provider: Baruch Gouty Treating Provider/Extender: Altamese [place] in Treatment: 0 Subjective Chief Complaint Information obtained from Patient 03/30/18; patient is here for review of an abscess site in the left lower abdominal quadrant 11/09/18; patient returns to clinic for review of a small open area just above her symphysis pubis in the lower abdomen. 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 She tells me everything was fine up until a week ago. She noticed pain in her left groin area. By Saturday this it started to drain. She applied black salve to this area which she has done in the past and is helped heal problems in this area. This did not work. She was seen in the ER on 03/27/18 with an abscess in the left groin. Noted to have surrounding cellulitis  and an indurated area. She underwent an IandD. She was started on patient clindamycin. Her white count was 11.9. Comprehensive metabolic panel normal lactic acid level normal at 1.2. They did not do a culture that I can see. She was seen yesterday at her primary physician's office she may have been put on cephalexin at that point. She is using iodoform packing The patient is a type II diabetic with a history of PAD last hemoglobin A1c of 7.3. She has a history of parkinsonism, COPD, bipolar disorder hypertension. She is a continued 1 pack per day smoker She tells me that she does have a history of recurrent abscesses in this area last about a year ago. Mostly she seems to care for these herself and they seem to go away. 04/06/18; patient's surgical IandD on the lower left abdominal quadrant just above the symphysis pubis appears better. Following is better we've been using silver alginate. Culture I did last week did not grow MRSA rather Enterococcus faecalis. I substituted Augmentin for the clindamycin however the patient didn't have enough money, however she will pick it up this morning 04/13/18; surgical IandD on the lower left abdominal quadrant just above the symphysis pubis. Surface of this area looks healthy. This is an oval-shaped wound lying horizontally. Medially it has A, all with about 0.9 cm in depth there is no drainage no surrounding erythema. She is completing the Augmentin I gave her last week 04/27/18; surgical IandD site on the lower left abdominal  quadrant just above the symphysis pubis. Surface of this wound is improved dimensions are better. We've been using silver alginate and border foam. She tells me she developed C. difficile has been treated with oral vancomycin. Will need to be very judicious with any further antibiotics 05/04/18; surgical IandD site on the lower left abdomen just above the symphysis pubis. All of this is closed over except a small slit like area which is probably where the sinus tract was. There is no drainage no tenderness. 05/11/18; surgical IandD site on the left lower abdomen just above the symphysis pubis. All of this is closed. There is no open area here. No drainage and no tenderness she does have a history of abscesses but apparently none that have had difficulty healing like this one READMISSION 11/09/18 This is a patient we know from previous stays in this clinic. She apparently has a history of recurrent Cutaneous abscesses. We had her in the clinic last time with a lower abdomen/inguinal area abscess that required a surgical IandD. We managed to get this to close over. She also lives in fear of recurrent C. difficile and does not want systemic antibiotics. She tells us that she developed a small abscess in the wound area about 2-3 weeks ago. This spontaneously ruptured and had purulent material maintained. She is here for our review of the resultant wound Christin FudgeKANE, Solomia A. ([id number]) Wound History Patient presents with 1 open wound that has been present for approximately 3 weeks. Patient has been treating wound in the following manner: silvercel. Laboratory tests have not been performed in the last month. Patient reportedly has tested positive for an antibiotic resistant organism. Patient reportedly has not tested positive for osteomyelitis. Patient reportedly has not had testing performed to evaluate circulation in the legs. Patient History Information obtained from Patient. Allergies Benadryl,  canagliflozin, Depakote, Dilaudid, haloperidol, hydromorphone, Neosporin (neo-bac-polym), Singulair, Statins-Hmg- Coa Reductase Inhibitors, sitagliptin, Sulfa (Sulfonamide Antibiotics), Victoza Family History Cancer - Maternal Grandparents, Diabetes - Father, Hypertension - Father,Paternal Grandparents,  Seizures - Siblings, Stroke - Mother, Thyroid Problems - Mother,Child, No family history of Heart Disease, Hereditary Spherocytosis, Kidney Disease, Lung Disease, Tuberculosis. Social History Current some day smoker - .5 pack day, Marital Status - Married, Alcohol Use - Rarely, Drug Use - No History, Caffeine Use - Daily. Medical History Eyes Denies history of Cataracts, Glaucoma, Optic Neuritis Ear/Nose/Mouth/Throat Denies history of Chronic sinus problems/congestion, Middle ear problems Hematologic/Lymphatic Denies history of Anemia, Hemophilia, Human Immunodeficiency Virus, Lymphedema, Sickle Cell Disease Respiratory Denies history of Aspiration, Asthma, Chronic Obstructive Pulmonary Disease (COPD), Pneumothorax, Sleep Apnea, Tuberculosis Cardiovascular Denies history of Angina, Arrhythmia, Congestive Heart Failure, Coronary Artery Disease, Deep Vein Thrombosis, Hypotension, Myocardial Infarction, Peripheral Arterial Disease, Peripheral Venous Disease, Phlebitis, Vasculitis Gastrointestinal Denies history of Cirrhosis , Colitis, Crohn s, Hepatitis A, Hepatitis B, Hepatitis C Endocrine Denies history of Type I Diabetes Genitourinary Denies history of End Stage Renal Disease Immunological Denies history of Lupus Erythematosus, Raynaud s, Scleroderma Integumentary (Skin) Denies history of History of Burn, History of pressure wounds Musculoskeletal Denies history of Gout, Rheumatoid Arthritis, Osteomyelitis Neurologic Denies history of Dementia, Neuropathy, Quadriplegia, Paraplegia, Seizure Disorder Oncologic Denies history of Received Chemotherapy, Received Radiation Patient is  treated with Insulin. Blood sugar is tested. Medical And Surgical History Notes Gastrointestinal history of C Diff Neurologic CHENITA, RUDA ([id number]) Parkinson's Review of Systems (ROS) Constitutional Symptoms (General Health) Denies complaints or symptoms of Fatigue, Fever, Chills, Marked Weight Change. Eyes Complains or has symptoms of Glasses / Contacts - glasses. Denies complaints or symptoms of Dry Eyes, Vision Changes. Ear/Nose/Mouth/Throat Denies complaints or symptoms of Difficult clearing ears, Sinusitis. Hematologic/Lymphatic Denies complaints or symptoms of Bleeding / Clotting Disorders, Human Immunodeficiency Virus. Respiratory Denies complaints or symptoms of Chronic or frequent coughs, Shortness of Breath. Cardiovascular Denies complaints or symptoms of Chest pain, LE edema. Gastrointestinal Denies complaints or symptoms of Frequent diarrhea, Nausea, Vomiting. Endocrine Denies complaints or symptoms of Hepatitis, Thyroid disease, Polydypsia (Excessive Thirst). Genitourinary Denies complaints or symptoms of Kidney failure/ Dialysis, Incontinence/dribbling. Immunological Denies complaints or symptoms of Hives, Itching. Integumentary (Skin) Complains or has symptoms of Wounds. Denies complaints or symptoms of Bleeding or bruising tendency, Breakdown, Swelling. Musculoskeletal Denies complaints or symptoms of Muscle Pain, Muscle Weakness. Neurologic Denies complaints or symptoms of Numbness/parasthesias, Focal/Weakness. Objective Constitutional Sitting or standing Blood Pressure is within target range for patient.. Pulse regular and within target range for patient.[name] Respirations regular, non-labored and within target range.. Temperature is normal and within the target range for the patient.[name] appears in no distress. Vitals Time Taken: 1:22 PM, Height: 65 in, Source: Measured, Weight: 230 lbs, Source: Measured, BMI: 38.3, Temperature: 98.3 F, Pulse: 77 bpm,  Respiratory Rate: 16 breaths/min, Blood Pressure: 121/55 mmHg. Respiratory Respiratory effort is easy and symmetric bilaterally. Rate is normal at rest and on room air.. Bilateral breath sounds are clear and equal in all lobes with no wheezes, rales or rhonchi.. Cardiovascular Heart rhythm and rate regular, without murmur or gallop.. Gastrointestinal (GI) YAN, OKRAY A. ([id number]) Obese but no tenderness no masses. No liver or spleen enlargement or tenderness.. General Notes: Wound exam; the area in question is in the lower midabdomen just above her symphysis pubis. It also happens to be in the crease of her overlying abdominal pannus. The wound at this point appears healthy probably 3 mm in depth. There is no surrounding soft tissue cellulitis no evidence of infection. The wound required vigorous debridement with normal saline and gauze. Integumentary (Hair, Skin) No primary skin issues are seen. Wound #  2 status is Open. Original cause of wound was Bump. The wound is located on the Left Abdomen - Lower Quadrant. The wound measures 0.6cm length x 1.2cm width x 0.4cm depth; 0.565cm^2 area and 0.226cm^3 volume. There is Fat Layer (Subcutaneous Tissue) Exposed exposed. There is no tunneling noted, however, there is undermining starting at 5:00 and ending at 7:00 with a maximum distance of 0.4cm. There is a medium amount of serous drainage noted. The wound margin is flat and intact. There is medium (34-66%) red granulation within the wound bed. There is a medium (34-66%) amount of necrotic tissue within the wound bed including Adherent Slough. The periwound skin appearance did not exhibit: Callus, Crepitus, Excoriation, Induration, Rash, Scarring, Dry/Scaly, Maceration, Atrophie Blanche, Cyanosis, Ecchymosis, Hemosiderin Staining, Mottled, Pallor, Rubor, Erythema. Periwound temperature was noted as No Abnormality. The periwound has tenderness on palpation. Assessment Active  Problems ICD-10 Unspecified open wound of abdominal wall, unspecified quadrant without penetration into peritoneal cavity, sequela Cutaneous abscess of abdominal wall Type 2 diabetes mellitus with other skin ulcer Procedures Wound #2 Pre-procedure diagnosis of Wound #2 is a Cyst located on the Left Abdomen - Lower Quadrant . There was a Chemical/Enzymatic/Mechanical debridement performed by Maxwell Caul, MD. With the following instrument(s): saline and gauze after achieving pain control using Lidocaine. Other agent used was saline and gauze. A time out was conducted at 13:30, prior to the start of the procedure. A Minimum amount of bleeding was controlled with Pressure. The procedure was tolerated well with a pain level of 1 throughout and a pain level of 1 following the procedure. Post Debridement Measurements: 0.5cm length x 1.2cm width x 0.4cm depth; 0.188cm^3 volume. Character of Wound/Ulcer Post Debridement is stable. Post procedure Diagnosis Wound #2: Same as Pre-Procedure Plan Wound Cleansing: MAHLANI, BERNINGER ([id number]) Wound #2 Left Abdomen - Lower Quadrant: Clean wound with Normal Saline. Anesthetic (add to Medication List): Wound #2 Left Abdomen - Lower Quadrant: Topical Lidocaine 4% cream applied to wound bed prior to debridement (In Clinic Only). Primary Wound Dressing: Wound #2 Left Abdomen - Lower Quadrant: Silver Alginate Secondary Dressing: Wound #2 Left Abdomen - Lower Quadrant: Boardered Foam Dressing Dressing Change Frequency: Wound #2 Left Abdomen - Lower Quadrant: Change dressing every other day. Follow-up Appointments: Wound #2 Left Abdomen - Lower Quadrant: Return Appointment in 1 week. #1 the patient is deathly afraid of getting any additional systemic antibiotics but truthfully none is required. #2 presumably an abscess that ruptured spontaneously. There is no evidence of surrounding infection and no need for consideration of antibiotics either  systemic or topical #3 the patient is fairly certain that this represents abscesses rather than any other type of skin lesion. We are going to use silver alginate border foam I have asked her to separate The pannus folds around this using a hand towel Electronic Signature(s) Signed: 11/09/2018 4:50:38 PM By: Baltazar Najjar MD Entered By: Baltazar Najjar on 11/09/2018 14:55:13 Debra [name] ([id number]) -------------------------------------------------------------------------------- ROS/PFSH Details Patient Name: Debra [name]. Date of Service: 11/09/2018 1:00 PM Medical Record Number: [id number] Patient Account Number: [id number] Date of Birth/Sex: 08/14/1961 (57 y.o. F) Treating RN: Curtis Sites Primary Care Provider: Baruch Gouty Other Clinician: Referring Provider: Baruch Gouty Treating Provider/Extender: Altamese [place] in Treatment: 0 Information Obtained From Patient Wound History Do you currently have one or more open woundso Yes How many open wounds do you currently haveo 1 Approximately how long have you had your woundso 3 weeks How have you been treating your  wound(s) until nowo silvercel Has your wound(s) ever healed and then re-openedo No Have you had any lab work done in the past montho No Have you tested positive for osteomyelitis (bone infection)o No Have you had any tests for circulation on your legso No Constitutional Symptoms (General Health) Complaints and Symptoms: Negative for: Fatigue; Fever; Chills; Marked Weight Change Eyes Complaints and Symptoms: Positive for: Glasses / Contacts - glasses Negative for: Dry Eyes; Vision Changes Medical History: Negative for: Cataracts; Glaucoma; Optic Neuritis Ear/Nose/Mouth/Throat Complaints and Symptoms: Negative for: Difficult clearing ears; Sinusitis Medical History: Negative for: Chronic sinus problems/congestion; Middle ear problems Hematologic/Lymphatic Complaints and Symptoms: Negative for: Bleeding  / Clotting Disorders; Human Immunodeficiency Virus Medical History: Negative for: Anemia; Hemophilia; Human Immunodeficiency Virus; Lymphedema; Sickle Cell Disease Respiratory Complaints and Symptoms: Negative for: Chronic or frequent coughs; Shortness of Breath Medical History: Negative for: Aspiration; Asthma; Chronic Obstructive Pulmonary Disease (COPD); Pneumothorax; Sleep Apnea; DALAYLA, ALDREDGE ([id number]) Tuberculosis Cardiovascular Complaints and Symptoms: Negative for: Chest pain; LE edema Medical History: Positive for: Hypertension Negative for: Angina; Arrhythmia; Congestive Heart Failure; Coronary Artery Disease; Deep Vein Thrombosis; Hypotension; Myocardial Infarction; Peripheral Arterial Disease; Peripheral Venous Disease; Phlebitis; Vasculitis Gastrointestinal Complaints and Symptoms: Negative for: Frequent diarrhea; Nausea; Vomiting Medical History: Negative for: Cirrhosis ; Colitis; Crohnos; Hepatitis A; Hepatitis B; Hepatitis C Past Medical History Notes: history of C Diff Endocrine Complaints and Symptoms: Negative for: Hepatitis; Thyroid disease; Polydypsia (Excessive Thirst) Medical History: Positive for: Type II Diabetes Negative for: Type I Diabetes Time with diabetes: 10 yrs Treated with: Insulin Blood sugar tested every day: Yes Tested : 4x day Genitourinary Complaints and Symptoms: Negative for: Kidney failure/ Dialysis; Incontinence/dribbling Medical History: Negative for: End Stage Renal Disease Immunological Complaints and Symptoms: Negative for: Hives; Itching Medical History: Negative for: Lupus Erythematosus; Raynaudos; Scleroderma Integumentary (Skin) Complaints and Symptoms: Positive for: Wounds Negative for: Bleeding or bruising tendency; Breakdown; Swelling Medical History: Negative for: History of Burn; History of pressure wounds ESTY, AHUJA A. ([id number]) Musculoskeletal Complaints and Symptoms: Negative for: Muscle Pain; Muscle  Weakness Medical History: Positive for: Osteoarthritis Negative for: Gout; Rheumatoid Arthritis; Osteomyelitis Neurologic Complaints and Symptoms: Negative for: Numbness/parasthesias; Focal/Weakness Medical History: Negative for: Dementia; Neuropathy; Quadriplegia; Paraplegia; Seizure Disorder Past Medical History Notes: Parkinson's Oncologic Medical History: Negative for: Received Chemotherapy; Received Radiation Immunizations Pneumococcal Vaccine: Received Pneumococcal Vaccination: Yes Implantable Devices Family and Social History Cancer: Yes - Maternal Grandparents; Diabetes: Yes - Father; Heart Disease: No; Hereditary Spherocytosis: No; Hypertension: Yes - Father,Paternal Grandparents; Kidney Disease: No; Lung Disease: No; Seizures: Yes - Siblings; Stroke: Yes - Mother; Thyroid Problems: Yes - Mother,Child; Tuberculosis: No; Current some day smoker - .5 pack day; Marital Status - Married; Alcohol Use: Rarely; Drug Use: No History; Caffeine Use: Daily; Financial Concerns: No; Food, Clothing or Shelter Needs: No; Support System Lacking: No; Transportation Concerns: No; Advanced Directives: No; Patient does not want information on Advanced Directives; Do not resuscitate: No; Living Will: No; Medical Power of Attorney: No Electronic Signature(s) Signed: 11/09/2018 4:50:38 PM By: Baltazar Najjar MD Signed: 11/09/2018 5:09:45 PM By: Curtis Sites Entered By: Curtis Sites on 11/09/2018 13:19:55 Debra [name] ([id number]) -------------------------------------------------------------------------------- SuperBill Details Patient Name: Debra [name] Date of Service: 11/09/2018 Medical Record Number: [id number] Patient Account Number: [id number] Date of Birth/Sex: July 08, 1961 (57 y.o. F) Treating RN: Huel Coventry Primary Care Provider: Baruch Gouty Other Clinician: Referring Provider: Baruch Gouty Treating Provider/Extender: Altamese [place] in Treatment: 0 Diagnosis Coding ICD-10  Codes Code Description Unspecified open wound  of abdominal wall, unspecified quadrant without penetration into peritoneal S31.109S cavity, sequela L02.211 Cutaneous abscess of abdominal wall E11.622 Type 2 diabetes mellitus with other skin ulcer Facility Procedures CPT4 Code: 54098119 Description: 99213 - WOUND CARE VISIT-LEV 3 EST PT Modifier: Quantity: 1 CPT4 Code: 14782956 Description: 21308 - [place] W/O ANES NON SELECT Modifier: Quantity: 1 Physician Procedures CPT4: Description Modifier Quantity Code [id number] 99213 - WC PHYS LEVEL 3 - EST PT 1 ICD-10 Diagnosis Description S31.109S Unspecified open wound of abdominal wall, unspecified quadrant without penetration into peritoneal cavity, sequela L02.211 Cutaneous  abscess of abdominal wall Electronic Signature(s) Signed: 11/09/2018 4:50:38 PM By: Baltazar Najjar MD Entered By: Baltazar Najjar on 11/09/2018 14:55:46

## 2018-11-12 NOTE — Progress Notes (Signed)
THI, KLICH ([id number]) Visit Report for 11/09/2018 Abuse/Suicide Risk Screen Details Patient Name: Debra [name], Debra [name]. Date of Service: 11/09/2018 1:00 PM Medical Record Number: [id number] Patient Account Number: [id number] Date of Birth/Sex: [birth year] (57 y.o. F) Treating RN: Curtis Sites Primary Care Rudell Ortman: Baruch Gouty Other Clinician: Referring Maylani Embree: Baruch Gouty Treating Montrez [place]/Extender: Altamese [place] in Treatment: 0 Abuse/Suicide Risk Screen Items Answer ABUSE/SUICIDE RISK SCREEN: Has anyone close to you tried to hurt or harm you recentlyo No Do you feel uncomfortable with anyone in your familyo No Has anyone forced you do things that you didnot want to doo No Do you have any thoughts of harming yourselfo No Patient displays signs or symptoms of abuse and/or neglect. No Electronic Signature(s) Signed: 11/09/2018 5:09:45 PM By: Curtis Sites Entered By: Curtis Sites on 11/09/2018 13:17:10 Debra [name] ([id number]) -------------------------------------------------------------------------------- Activities of Daily Living Details Patient Name: Debra [name] Date of Service: 11/09/2018 1:00 PM Medical Record Number: [id number] Patient Account Number: [id number] Date of Birth/Sex: 06-13-1961 (57 y.o. F) Treating RN: Curtis Sites Primary Care Callie Facey: Baruch Gouty Other Clinician: Referring Takeyla Million: Baruch Gouty Treating Nneka Blanda/Extender: Altamese [place] in Treatment: 0 Activities of Daily Living Items Answer Activities of Daily Living (Please select one for each item) Drive Automobile Completely Able Take Medications Completely Able Use Telephone Completely Able Care for Appearance Completely Able Use Toilet Completely Able Bath / Shower Completely Able Dress Self Completely Able Feed Self Completely Able Walk Completely Able Get In / Out Bed Completely Able Housework Completely Able Prepare Meals Completely Able Handle Money Completely  Able Shop for Self Completely Able Electronic Signature(s) Signed: 11/09/2018 5:09:45 PM By: Curtis Sites Entered By: Curtis Sites on 11/09/2018 13:20:51 Debra [name] ([id number]) -------------------------------------------------------------------------------- Education Assessment Details Patient Name: Debra [name] Date of Service: 11/09/2018 1:00 PM Medical Record Number: [id number] Patient Account Number: [id number] Date of Birth/Sex: Nov 20, 1961 (57 y.o. F) Treating RN: Curtis Sites Primary Care Martisha Toulouse: Baruch Gouty Other Clinician: Referring Emoree Sasaki: Baruch Gouty Treating Almer [place]/Extender: Altamese [place] in Treatment: 0 Primary Learner Assessed: Patient Learning Preferences/Education Level/Primary Language Learning Preference: Explanation, Demonstration Highest Education Level: College or Above Preferred Language: English Cognitive Barrier Assessment/Beliefs Language Barrier: No Translator Needed: No Memory Deficit: No Emotional Barrier: No Cultural/Religious Beliefs Affecting Medical Care: No Physical Barrier Assessment Impaired Vision: No Impaired Hearing: No Decreased Hand dexterity: No Knowledge/Comprehension Assessment Knowledge Level: Medium Comprehension Level: Medium Ability to understand written Medium instructions: Ability to understand verbal Medium instructions: Motivation Assessment Anxiety Level: Calm Cooperation: Cooperative Education Importance: Acknowledges Need Interest in Health Problems: Asks Questions Perception: Coherent Willingness to Engage in Self- Medium Management Activities: Readiness to Engage in Self- Medium Management Activities: Electronic Signature(s) Signed: 11/09/2018 5:09:45 PM By: Curtis Sites Entered By: Curtis Sites on 11/09/2018 13:21:14 Debra [name] ([id number]) -------------------------------------------------------------------------------- Fall Risk Assessment Details Patient Name: Debra [name] Date of Service: 11/09/2018 1:00 PM Medical Record Number: [id number] Patient Account Number: [id number] Date of Birth/Sex: March 10, 1961 (57 y.o. F) Treating RN: Curtis Sites Primary Care Christyan Reger: Baruch Gouty Other Clinician: Referring Sederick Jacobsen: Baruch Gouty Treating Dozier Berkov[place]/Extender: Altamese [place] in Treatment: 0 Fall Risk Assessment Items Have you had 2 or more falls in the last 12 monthso 0 No Have you had any fall that resulted in injury in the last 12 monthso 0 No FALL RISK ASSESSMENT: History of falling - immediate or within 3 months 0 No Secondary diagnosis 0 No Ambulatory aid None/bed rest/wheelchair/nurse 0 Yes Crutches/cane/walker 0 No Furniture  0 No IV Access/Saline Lock 0 No Gait/Training Normal/bed rest/immobile 0 No Weak 10 Yes Impaired 0 No Mental Status Oriented to own ability 0 Yes Electronic Signature(s) Signed: 11/09/2018 5:09:45 PM By: Curtis Sitesorthy, Joanna Entered By: Curtis Sitesorthy, Joanna on 11/09/2018 13:21:45 Debra [name], Debra A. ([id number]) -------------------------------------------------------------------------------- Foot Assessment Details Patient Name: Debra [name], Debra A. Date of Service: 11/09/2018 1:00 PM Medical Record Number: [id number] Patient Account Number: [id number] Date of Birth/Sex: 07-03-1961 (57 y.o. F) Treating RN: Curtis Sitesorthy, Joanna Primary Care Angelissa Supan: Baruch GoutyLada, Melinda Other Clinician: Referring Jahlon Baines: Baruch GoutyLada, Melinda Treating Mitsuo Budnick/Extender: Altamese CarolinaOBSON, MICHAEL G [place] in Treatment: 0 Foot Assessment Items Site Locations + = Sensation present, - = Sensation absent, C = Callus, U = Ulcer R = Redness, W = Warmth, M = Maceration, PU = Pre-ulcerative lesion F = Fissure, S = Swelling, D = Dryness Assessment Right: Left: Other Deformity: No No Prior Foot Ulcer: No No Prior Amputation: No No Charcot Joint: No No Ambulatory Status: Ambulatory Without Help Gait: Steady Electronic Signature(s) Signed: 11/09/2018 5:09:45 PM By: Curtis Sitesorthy,  Joanna Entered By: Curtis Sitesorthy, Joanna on 11/09/2018 13:21:58 Debra [name], Debra A. ([id number]) -------------------------------------------------------------------------------- Nutrition Risk Assessment Details Patient Name: Debra [name], Debra A. Date of Service: 11/09/2018 1:00 PM Medical Record Number: [id number] Patient Account Number: [id number] Date of Birth/Sex: 07-03-1961 (57 y.o. F) Treating RN: Curtis Sitesorthy, Joanna Primary Care Bryella Diviney: Baruch GoutyLada, Melinda Other Clinician: Referring Roshad Hack: Baruch GoutyLada, Melinda Treating Edel Rivero/Extender: Altamese CarolinaOBSON, MICHAEL G [place] in Treatment: 0 Height (in): 65 Weight (lbs): 230 Body Mass Index (BMI): 38.3 Nutrition Risk Assessment Items NUTRITION RISK SCREEN: I have an illness or condition that made me change the kind and/or amount of 0 No food I eat I eat fewer than two meals per day 0 No I eat few fruits and vegetables, or milk products 0 No I have three or more drinks of beer, liquor or wine almost every day 0 No I have tooth or mouth problems that make it hard for me to eat 0 No I don't always have enough money to buy the food I need 0 No I eat alone most of the time 0 No I take three or more different prescribed or over-the-counter drugs a day 1 Yes Without wanting to, I have lost or gained 10 pounds in the last six months 0 No I am not always physically able to shop, cook and/or feed myself 0 No Nutrition Protocols Good Risk Protocol 0 No interventions needed Moderate Risk Protocol Electronic Signature(s) Signed: 11/09/2018 5:09:45 PM By: Curtis Sitesorthy, Joanna Entered By: Curtis Sitesorthy, Joanna on 11/09/2018 13:21:52

## 2018-11-12 NOTE — Progress Notes (Signed)
DANIELL, PARADISE ([id number]) Visit Report for 11/09/2018 Allergy List Details Patient Name: SHAQUILLE, MURDY. Date of Service: 11/09/2018 1:00 PM Medical Record Number: [id number] Patient Account Number: [id number] Date of Birth/Sex: Dec 27, 1960 (57 y.o. F) Treating RN: Curtis Sites Primary Care Melia Hopes: Baruch Gouty Other Clinician: Referring Ashley Montminy: Baruch Gouty Treating Tyronne Blann/Extender: Maxwell Caul We[place] in Treatment: 0 Allergies Active Allergies Benadryl canagliflozin Depakote Dilaudid haloperidol hydromorphone Neosporin (neo-bac-polym) Singulair Statins-Hmg-Coa Reductase Inhibitors sitagliptin Sulfa (Sulfonamide Antibiotics) Victoza Allergy Notes Electronic Signature(s) Signed: 11/09/2018 5:09:45 PM By: Curtis Sites Entered By: Curtis Sites on 11/09/2018 13:13:47 [name] ([id number]) -------------------------------------------------------------------------------- Arrival Information Details Patient Name: [name]. Date of Service: 11/09/2018 1:00 PM Medical Record Number: [id number] Patient Account Number: [id number] Date of Birth/Sex: 02/17/1961 (57 y.o. F) Treating RN: Huel Coventry Primary Care [place] Ice: Baruch Gouty Other Clinician: Referring Jamilee Lafosse: Baruch Gouty Treating Leontina Skidmore/Extender: Altamese [place] in Treatment: 0 Visit Information Patient Arrived: Ambulatory Arrival Time: 13:09 Accompanied By: friend Transfer Assistance: None Patient Identification Verified: Yes Secondary Verification Process Yes Completed: Patient Has Alerts: Yes Patient Alerts: Patient on Blood Thinner aspirin 81 Type II DM History Since Last Visit Signs or symptoms of abuse/neglect since last visito No Hospitalized since last visit: No Electronic Signature(s) Signed: 11/09/2018 6:02:29 PM By: Elliot Gurney, BSN, RN, CWS, Haya RN, BSN Entered By: Elliot Gurney, BSN, RN, CWS, Marvene on 11/09/2018 13:10:11 [name]  ([id number]) -------------------------------------------------------------------------------- Clinic Level of Care Assessment Details Patient Name: [name]. Date of Service: 11/09/2018 1:00 PM Medical Record Number: [id number] Patient Account Number: [id number] Date of Birth/Sex: Aug 14, 1961 (57 y.o. F) Treating RN: Huel Coventry Primary Care Kahlani Graber: Baruch Gouty Other Clinician: Referring Torry Istre: Baruch Gouty Treating Quincey Nored/Extender: Altamese [place] in Treatment: 0 Clinic Level of Care Assessment Items TOOL 2 Quantity Score []  - Use when only an EandM is performed on the INITIAL visit 0 ASSESSMENTS - Nursing Assessment / Reassessment X - General Physical Exam (combine w/ comprehensive assessment (listed just below) when 1 20 performed on new pt. evals) X- 1 25 Comprehensive Assessment (HX, ROS, Risk Assessments, Wounds Hx, etc.) ASSESSMENTS - Wound and Skin Assessment / Reassessment X - Simple Wound Assessment / Reassessment - one wound 1 5 []  - 0 Complex Wound Assessment / Reassessment - multiple wounds []  - 0 Dermatologic / Skin Assessment (not related to wound area) ASSESSMENTS - Ostomy and/or Continence Assessment and Care []  - Incontinence Assessment and Management 0 []  - 0 Ostomy Care Assessment and Management (repouching, etc.) PROCESS - Coordination of Care X - Simple Patient / Family Education for ongoing care 1 15 []  - 0 Complex (extensive) Patient / Family Education for ongoing care []  - 0 Staff obtains Chiropractor, Records, Test Results / Process Orders []  - 0 Staff telephones HHA, Nursing Homes / Clarify orders / etc []  - 0 Routine Transfer to another Facility (non-emergent condition) []  - 0 Routine Hospital Admission (non-emergent condition) []  - 0 New Admissions / Manufacturing engineer / Ordering NPWT, Apligraf, etc. []  - 0 Emergency Hospital Admission (emergent condition) X- 1 10 Simple Discharge Coordination []  - 0 Complex (extensive)  Discharge Coordination PROCESS - Special Needs []  - Pediatric / Minor Patient Management 0 []  - 0 Isolation Patient Management CANDISE, CRABTREE ([id number]) []  - 0 Hearing / Language / Visual special needs []  - 0 Assessment of Community assistance (transportation, D/C planning, etc.) []  - 0 Additional assistance / Altered mentation []  - 0 Support Surface(s) Assessment (bed, cushion, seat, etc.) INTERVENTIONS - Wound  Cleansing / Measurement X - Wound Imaging (photographs - any number of wounds) 1 5 []  - 0 Wound Tracing (instead of photographs) X- 1 5 Simple Wound Measurement - one wound []  - 0 Complex Wound Measurement - multiple wounds X- 1 5 Simple Wound Cleansing - one wound []  - 0 Complex Wound Cleansing - multiple wounds INTERVENTIONS - Wound Dressings []  - Small Wound Dressing one or multiple wounds 0 X- 1 15 Medium Wound Dressing one or multiple wounds []  - 0 Large Wound Dressing one or multiple wounds []  - 0 Application of Medications - injection INTERVENTIONS - Miscellaneous []  - External ear exam 0 []  - 0 Specimen Collection (cultures, biopsies, blood, body fluids, etc.) []  - 0 Specimen(s) / Culture(s) sent or taken to Lab for analysis []  - 0 Patient Transfer (multiple staff / Nurse, adult / Similar devices) []  - 0 Simple Staple / Suture removal (25 or less) []  - 0 Complex Staple / Suture removal (26 or more) []  - 0 Hypo / Hyperglycemic Management (close monitor of Blood Glucose) []  - 0 Ankle / Brachial Index (ABI) - do not check if billed separately Has the patient been seen at the hospital within the last three years: Yes Total Score: 105 Level Of Care: New/Established - Level 3 Electronic Signature(s) Signed: 11/09/2018 6:02:29 PM By: Elliot Gurney, BSN, RN, CWS, Audery RN, BSN Entered By: Elliot Gurney, BSN, RN, CWS, Mikiyah on 11/09/2018 13:52:36 [name] ([id number]) -------------------------------------------------------------------------------- Encounter Discharge  Information Details Patient Name: [name]. Date of Service: 11/09/2018 1:00 PM Medical Record Number: [id number] Patient Account Number: [id number] Date of Birth/Sex: 1961/03/26 (57 y.o. F) Treating RN: Huel Coventry Primary Care Krystale Rinkenberger: Baruch Gouty Other Clinician: Referring Cleda Imel: Baruch Gouty Treating Stayce Delancy/Extender: Altamese [place] in Treatment: 0 Encounter Discharge Information Items Discharge Condition: Stable Ambulatory Status: Ambulatory Discharge Destination: Home Transportation: Private Auto Accompanied By: self Schedule Follow-up Appointment: Yes Clinical Summary of Care: Electronic Signature(s) Signed: 11/09/2018 2:04:52 PM By: Elliot Gurney, BSN, RN, CWS, Dayonna RN, BSN Entered By: Elliot Gurney, BSN, RN, CWS, Neville on 11/09/2018 14:04:52 [name] ([id number]) -------------------------------------------------------------------------------- Lower Extremity Assessment Details Patient Name: [name] Date of Service: 11/09/2018 1:00 PM Medical Record Number: [id number] Patient Account Number: [id number] Date of Birth/Sex: March 29, 1961 (57 y.o. F) Treating RN: Curtis Sites Primary Care Bryttney Netzer: Baruch Gouty Other Clinician: Referring Tekia Waterbury: Baruch Gouty Treating Shanekia Latella/Extender: Altamese [place] in Treatment: 0 Electronic Signature(s) Signed: 11/09/2018 5:09:45 PM By: Curtis Sites Entered By: Curtis Sites on 11/09/2018 13:22:55 [name] ([id number]) -------------------------------------------------------------------------------- Multi Wound Chart Details Patient Name: [name] Date of Service: 11/09/2018 1:00 PM Medical Record Number: [id number] Patient Account Number: [id number] Date of Birth/Sex: Feb 10, 1961 (57 y.o. F) Treating RN: Huel Coventry Primary Care Berlynn Warsame: Baruch Gouty Other Clinician: Referring Aashika Carta: Baruch Gouty Treating Johnika Escareno/Extender: Altamese [place] in Treatment: 0 Vital Signs Height(in): 65 Pulse(bpm):  77 Weight(lbs): 230 Blood Pressure(mmHg): 121/55 Body Mass Index(BMI): 38 Temperature(F): 98.3 Respiratory Rate 16 (breaths/min): Photos: [2:No Photos] [N/A:N/A] Wound Location: [2:Left Abdomen - Lower Quadrant] [N/A:N/A] Wounding Event: [2:Bump] [N/A:N/A] Primary Etiology: [2:Cyst] [N/A:N/A] Comorbid History: [2:Hypertension, Type II Diabetes, Osteoarthritis] [N/A:N/A] Date Acquired: [2:10/28/2018] [N/A:N/A] Weeks of Treatment: [2:0] [N/A:N/A] Wound Status: [2:Open] [N/A:N/A] Measurements L x W x D [2:0.6x1.2x0.4] [N/A:N/A] (cm) Area (cm) : [2:0.565] [N/A:N/A] Volume (cm) : [2:0.226] [N/A:N/A] % Reduction in Area: [2:0.00%] [N/A:N/A] % Reduction in Volume: [2:0.00%] [N/A:N/A] Starting Position 1 [2:5] (o'clock): Ending Position 1 [2:7] (o'clock): Maximum Distance 1 (cm): [2:0.4] Undermining: [2:Yes] [  N/A:N/A] Classification: [2:Full Thickness Without Exposed Support Structures] [N/A:N/A] Exudate Amount: [2:Medium] [N/A:N/A] Exudate Type: [2:Serous] [N/A:N/A] Exudate Color: [2:amber] [N/A:N/A] Wound Margin: [2:Flat and Intact] [N/A:N/A] Granulation Amount: [2:Medium (34-66%)] [N/A:N/A] Granulation Quality: [2:Red] [N/A:N/A] Necrotic Amount: [2:Medium (34-66%)] [N/A:N/A] Exposed Structures: [2:Fat Layer (Subcutaneous Tissue) Exposed: Yes Fascia: No Tendon: No Muscle: No] [N/A:N/A] Joint: No Bone: No Epithelialization: None N/A N/A Debridement: Chemical/Enzymatic/Mechanical N/A N/A Pre-procedure 13:30 N/A N/A Verification/Time Out Taken: Pain Control: Lidocaine N/A N/A Instrument: Other(saline and gauze) N/A N/A Bleeding: Minimum N/A N/A Hemostasis Achieved: Pressure N/A N/A Procedural Pain: 1 N/A N/A Post Procedural Pain: 1 N/A N/A Debridement Treatment Procedure was tolerated well N/A N/A Response: Post Debridement 0.5x1.2x0.4 N/A N/A Measurements L x W x D (cm) Post Debridement Volume: 0.188 N/A N/A (cm) Periwound Skin Texture: Excoriation: No N/A  N/A Induration: No Callus: No Crepitus: No Rash: No Scarring: No Periwound Skin Moisture: Maceration: No N/A N/A Dry/Scaly: No Periwound Skin Color: Atrophie Blanche: No N/A N/A Cyanosis: No Ecchymosis: No Erythema: No Hemosiderin Staining: No Mottled: No Pallor: No Rubor: No Temperature: No Abnormality N/A N/A Tenderness on Palpation: Yes N/A N/A Wound Preparation: Ulcer Cleansing: N/A N/A Rinsed/Irrigated with Saline Topical Anesthetic Applied: Other: lidocaine 4% Procedures Performed: Debridement N/A N/A Treatment Notes Wound #2 (Left Abdomen - Lower Quadrant) 1. Cleansed with: Clean wound with Normal Saline Cleanse wound with antibacterial soap and water 2. Anesthetic Topical Lidocaine 4% cream to wound bed prior to debridement 4. Dressing Applied: Other dressing (specify in notes) 5. Secondary Dressing Applied Bordered Foam Dressing KSENIYA, GRUNDEN ([id number]) Electronic Signature(s) Signed: 11/09/2018 4:50:38 PM By: Baltazar Najjar MD Entered By: Baltazar Najjar on 11/09/2018 14:27:05 [name] ([id number]) -------------------------------------------------------------------------------- Multi-Disciplinary Care Plan Details Patient Name: [name] Date of Service: 11/09/2018 1:00 PM Medical Record Number: [id number] Patient Account Number: [id number] Date of Birth/Sex: [birth year] (57 y.o. F) Treating RN: Huel Coventry Primary Care Chessa Barrasso: Baruch Gouty Other Clinician: Referring Anaalicia Reimann: Baruch Gouty Treating Laquita Harlan/Extender: Altamese [place] in Treatment: 0 Active Inactive Orientation to the Wound Care Program Nursing Diagnoses: Knowledge deficit related to the wound healing center program Goals: Patient/caregiver will verbalize understanding of the Wound Healing Center Program Date Initiated: 11/09/2018 Target Resolution Date: 12/09/2018 Goal Status: Active Interventions: Provide education on orientation to the wound center Notes: Soft  Tissue Infection Nursing Diagnoses: Impaired tissue integrity Potential for infection: soft tissue Goals: Patient will remain free of wound infection Date Initiated: 11/09/2018 Target Resolution Date: 12/09/2018 Goal Status: Active Interventions: Assess signs and symptoms of infection every visit Notes: Wound/Skin Impairment Nursing Diagnoses: Impaired tissue integrity Goals: Ulcer/skin breakdown will have a volume reduction of 30% by week 4 Date Initiated: 11/09/2018 Target Resolution Date: 12/10/2018 Goal Status: Active Interventions: LOAN, OGUIN ([id number]) Assess ulceration(s) every visit Treatment Activities: Skin care regimen initiated : 11/09/2018 Notes: Electronic Signature(s) Signed: 11/09/2018 6:02:29 PM By: Elliot Gurney, BSN, RN, CWS, Cadie RN, BSN Entered By: Elliot Gurney, BSN, RN, CWS, Deanette on 11/09/2018 13:50:13 [name] ([id number]) -------------------------------------------------------------------------------- Pain Assessment Details Patient Name: [name]. Date of Service: 11/09/2018 1:00 PM Medical Record Number: [id number] Patient Account Number: [id number] Date of Birth/Sex: 01-29-1961 (57 y.o. F) Treating RN: Huel Coventry Primary Care Alegria Dominique: Baruch Gouty Other Clinician: Referring Cullen Lahaie: Baruch Gouty Treating Ponciano Shealy/Extender: Altamese [place] in Treatment: 0 Active Problems Location of Pain Severity and Description of Pain Patient Has Paino No Site Locations Pain Management and Medication Current Pain Management: Electronic Signature(s) Signed: 11/09/2018 6:02:29 PM By: Elliot Gurney, BSN,  RN, CWS, Lizeth RN, BSN Entered By: Elliot Gurney, BSN, RN, CWS, Xylina on 11/09/2018 13:10:17 [name] ([id number]) -------------------------------------------------------------------------------- Patient/Caregiver Education Details Patient Name: [name] Date of Service: 11/09/2018 1:00 PM Medical Record Number: [id number] Patient Account Number: [id number] Date of  Birth/Gender: [birth year] (57 y.o. F) Treating RN: Huel Coventry Primary Care Physician: Baruch Gouty Other Clinician: Referring Physician: Baruch Gouty Treating Physician/Extender: Altamese [place] in Treatment: 0 Education Assessment Education Provided To: Patient Education Topics Provided Wound/Skin Impairment: Handouts: Caring for Your Ulcer Methods: Demonstration, Explain/Verbal Responses: State content correctly Electronic Signature(s) Signed: 11/09/2018 6:02:29 PM By: Elliot Gurney, BSN, RN, CWS, Lyle RN, BSN Entered By: Elliot Gurney, BSN, RN, CWS, Vercie on 11/09/2018 13:52:52 [name] ([id number]) -------------------------------------------------------------------------------- Wound Assessment Details Patient Name: [name] Date of Service: 11/09/2018 1:00 PM Medical Record Number: [id number] Patient Account Number: [id number] Date of Birth/Sex: Oct 21, 1961 (57 y.o. F) Treating RN: Curtis Sites Primary Care Makhayla Mcmurry: Baruch Gouty Other Clinician: Referring Deneene Tarver: Baruch Gouty Treating Marrion Finan/Extender: Altamese [place] in Treatment: 0 Wound Status Wound Number: 2 Primary Etiology: Cyst Wound Location: Left Abdomen - Lower Quadrant Wound Status: Open Wounding Event: Bump Comorbid Hypertension, Type II Diabetes, History: Osteoarthritis Date Acquired: 10/28/2018 Weeks Of Treatment: 0 Clustered Wound: No Photos Photo Uploaded By: Curtis Sites on 11/09/2018 14:42:08 Wound Measurements Length: (cm) 0.6 % Redu Width: (cm) 1.2 % Redu Depth: (cm) 0.4 Epithe Area: (cm) 0.565 Tunne Volume: (cm) 0.226 Under Sta End Max ction in Area: 0% ction in Volume: 0% lialization: None ling: No mining: Yes rting Position (o'clock): 5 ing Position (o'clock): 7 imum Distance: (cm) 0.4 Wound Description Full Thickness Without Exposed Support Foul O Classification: Structures Slough Wound Margin: Flat and Intact Exudate Medium Amount: Exudate Type:  Serous Exudate Color: amber dor After Cleansing: No /Fibrino Yes Wound Bed Granulation Amount: Medium (34-66%) Exposed Structure Granulation Quality: Red Fascia Exposed: No Necrotic Amount: Medium (34-66%) Fat Layer (Subcutaneous Tissue) Exposed: Yes TAWNEY, VANORMAN ([id number]) Necrotic Quality: Adherent Slough Tendon Exposed: No Muscle Exposed: No Joint Exposed: No Bone Exposed: No Periwound Skin Texture Texture Color No Abnormalities Noted: No No Abnormalities Noted: No Callus: No Atrophie Blanche: No Crepitus: No Cyanosis: No Excoriation: No Ecchymosis: No Induration: No Erythema: No Rash: No Hemosiderin Staining: No Scarring: No Mottled: No Pallor: No Moisture Rubor: No No Abnormalities Noted: No Dry / Scaly: No Temperature / Pain Maceration: No Temperature: No Abnormality Tenderness on Palpation: Yes Wound Preparation Ulcer Cleansing: Rinsed/Irrigated with Saline Topical Anesthetic Applied: Other: lidocaine 4%, Treatment Notes Wound #2 (Left Abdomen - Lower Quadrant) 1. Cleansed with: Clean wound with Normal Saline Cleanse wound with antibacterial soap and water 2. Anesthetic Topical Lidocaine 4% cream to wound bed prior to debridement 4. Dressing Applied: Other dressing (specify in notes) 5. Secondary Dressing Applied Bordered Foam Dressing Electronic Signature(s) Signed: 11/09/2018 2:10:44 PM By: Elliot Gurney, BSN, RN, CWS, Vickii RN, BSN Signed: 11/09/2018 5:09:45 PM By: Curtis Sites Entered By: Elliot Gurney, BSN, RN, CWS, Tharon on 11/09/2018 14:10:44 [name] ([id number]) -------------------------------------------------------------------------------- Vitals Details Patient Name: [name] Date of Service: 11/09/2018 1:00 PM Medical Record Number: [id number] Patient Account Number: [id number] Date of Birth/Sex: [birth year] (57 y.o. F) Treating RN: Curtis Sites Primary Care Destyne Goodreau: Baruch Gouty Other Clinician: Referring Jamilya Sarrazin: Baruch Gouty Treating  Avyukt Cimo/Extender: Altamese  in Treatment: 0 Vital Signs Time Taken: 13:22 Temperature (F): 98.3 Height (in): 65 Pulse (bpm): 77 Source: Measured Respiratory Rate (breaths/min): 16 Weight (lbs): 230 Blood Pressure (mmHg): 121/55  Source: Measured Reference Range: 80 - 120 mg / dl Body Mass Index (BMI): 38.3 Electronic Signature(s) Signed: 11/09/2018 5:09:45 PM By: Curtis Sitesorthy, Joanna Entered By: Curtis Sitesorthy, Joanna on 11/09/2018 13:22:44

## 2018-11-18 ENCOUNTER — Ambulatory Visit (INDEPENDENT_AMBULATORY_CARE_PROVIDER_SITE_OTHER): Payer: Managed Care, Other (non HMO) | Admitting: Family Medicine

## 2018-11-18 ENCOUNTER — Encounter: Payer: Self-pay | Admitting: Family Medicine

## 2018-11-18 DIAGNOSIS — I70209 Unspecified atherosclerosis of native arteries of extremities, unspecified extremity: Secondary | ICD-10-CM

## 2018-11-18 DIAGNOSIS — G20 Parkinson's disease: Secondary | ICD-10-CM | POA: Insufficient documentation

## 2018-11-18 DIAGNOSIS — T466X5A Adverse effect of antihyperlipidemic and antiarteriosclerotic drugs, initial encounter: Secondary | ICD-10-CM

## 2018-11-18 DIAGNOSIS — E1151 Type 2 diabetes mellitus with diabetic peripheral angiopathy without gangrene: Secondary | ICD-10-CM | POA: Diagnosis not present

## 2018-11-18 DIAGNOSIS — G20C Parkinsonism, unspecified: Secondary | ICD-10-CM

## 2018-11-18 DIAGNOSIS — I700 Atherosclerosis of aorta: Secondary | ICD-10-CM | POA: Diagnosis not present

## 2018-11-18 DIAGNOSIS — IMO0002 Reserved for concepts with insufficient information to code with codable children: Secondary | ICD-10-CM

## 2018-11-18 DIAGNOSIS — E1165 Type 2 diabetes mellitus with hyperglycemia: Secondary | ICD-10-CM

## 2018-11-18 DIAGNOSIS — M791 Myalgia, unspecified site: Secondary | ICD-10-CM

## 2018-11-18 DIAGNOSIS — G4733 Obstructive sleep apnea (adult) (pediatric): Secondary | ICD-10-CM

## 2018-11-18 DIAGNOSIS — F3178 Bipolar disorder, in full remission, most recent episode mixed: Secondary | ICD-10-CM

## 2018-11-18 DIAGNOSIS — I10 Essential (primary) hypertension: Secondary | ICD-10-CM

## 2018-11-18 NOTE — Assessment & Plan Note (Signed)
Refer to psychiatrist; no SI/HI; encouragement given

## 2018-11-18 NOTE — Assessment & Plan Note (Signed)
Getting fitted for BiPAP, Dr. Sherryll BurgerShah

## 2018-11-18 NOTE — Assessment & Plan Note (Signed)
Intolerant to statins. 

## 2018-11-18 NOTE — Progress Notes (Signed)
BP 128/76   Pulse 80   Temp 98 F (36.7 C)   Ht 5' 4.5" (1.638 m)   Wt 273 lb 1.6 oz (123.9 kg)   SpO2 93%   BMI 46.15 kg/m    Subjective:    Patient ID: Debra [name], female    DOB: 07-14-1961, 57 y.o.   MRN: [id number]  HPI: Debra BowieKim A Barkan is a 57 y.o. female  Chief Complaint  Patient presents with  . Follow-up    HPI Patient is here for f/u Type 2 diabetes; she is interested in starting Ozempic She does not have MEN-2 in the family; no hx of medullary thyroid carcinoma Friend has had great success with Ozempic Thought about seeing Fabienne BrunsLeanne Owens, PA-C endocrinologist [facility] [place] [facility], [phone] Checking FSBS 3-4 times a day; reviewed readings; most 100s but a few 200s, rare 300s She did feel the low 65 readings This AM 187 (fell asleep last night and no insulin last night) Lab Results  Component Value Date   HGBA1C 8.1 (H) 08/19/2018   She is better from a GI standpoint; avoiding milk products  HTN; controlled today  Aorta atherosclerosis; goal LDL is 70, discussed; really trying to avoid saturated fats; just a treat once in a while Lab Results  Component Value Date   CHOL 229 (H) 08/19/2018   HDL 44 (L) 08/19/2018   LDLCALC 150 (H) 08/19/2018   TRIG 208 (H) 08/19/2018   CHOLHDL 5.2 (H) 08/19/2018   Bipolar d/o; mood is okay sometimes; sometimes just treading water; building house, going to be moving; has trouble getting in and out of the shower; new house is handicapped accessible; no steps; grab bars in both bathrooms; she is not seeing psychiatrist and is interested in seeing someone  Morbid obesity; weight has gone up; interested in ozempic to lose weight  Parkinsonian tremor; seeing neurologist  [facility]; going to get fitted for BiPAP  [facility] 11/18/2018 08/19/2018 08/17/2018 05/18/2018 04/22/2018  Decreased Interest 1 0 0 0 0  Down, Depressed, Hopeless 1 3 0 2 0  PHQ - 2 Score 2 3 0 2 0  Altered sleeping 2 3 - 3 -  Tired,  decreased energy 2 3 - 3 -  Change in appetite 1 3 - 2 -  Feeling bad or failure about yourself  0 1 - 0 -  Trouble concentrating 1 1 - 1 -  Moving slowly or fidgety/restless 0 0 - 0 -  Suicidal thoughts 0 0 - 0 -  PHQ-9 Score 8 14 - 11 -  Difficult doing work/chores Somewhat difficult Not difficult at all - Somewhat difficult -   Fall Risk  11/18/2018 08/19/2018 08/17/2018 05/18/2018 04/22/2018  Falls in the past year? 0 No No No No  Number falls in past yr: - - - - -  Injury with Fall? - - - - -  Comment - - - - -  Risk Factor Category  - - - - -  Comment - - - - -  Risk for fall due to : - - - - -  Risk for fall due to: Comment - - - - -  Follow up - - - - -    Relevant past medical, surgical, family and social history reviewed Past Medical History:  Diagnosis Date  . Anxiety   . Arthritis   . C. difficile colitis   . C. difficile colitis   . Depression   . Diabetes mellitus without complication (  HCC)    Type II  . Family history of adverse reaction to anesthesia    Mother - BP drops  . Head injury, closed, with brief LOC (HCC) 2011ish  . Hyperlipidemia   . Hypertension   . Insomnia   . Myalgia 03/29/2018   Due to statin  . Obesity   . Parkinson's disease (HCC)   . Seasonal allergies   . Tobacco abuse    Past Surgical History:  Procedure Laterality Date  . ABDOMINAL HYSTERECTOMY  2012  . BREAST BIOPSY Right   . CESAREAN SECTION  1992  . CHOLECYSTECTOMY  2013  . LUMBAR LAMINECTOMY/DECOMPRESSION MICRODISCECTOMY Left 09/10/2015   Procedure: Left L4-5 Foraminotomy/Left L5-S1 Diskectomy;  Surgeon: Hilda Lias, MD;  Location: [facility];  Service: Neurosurgery;  Laterality: Left;  Left L4-5 Foraminotomy/Left L5-S1 Diskectomy   Family History  Problem Relation Age of Onset  . COPD Mother   . Bipolar disorder Father   . Diabetes Mellitus II Father   . Bipolar disorder Brother   . Diabetes Mellitus II Brother   . Bipolar disorder Brother    Social History    Tobacco Use  . Smoking status: Current Every Day Smoker    Packs/day: 0.50    Years: 24.00    Pack years: 12.00    Types: Cigarettes  . Smokeless tobacco: Never Used  Substance Use Topics  . Alcohol use: Yes    Alcohol/week: 0.0 standard drinks    Comment: occasional- rare  . Drug use: No     Office Visit from 11/18/2018 in [facility]  AUDIT-C Score  0      Interim medical history since last visit reviewed. Allergies and medications reviewed  Review of Systems  Constitutional: Negative for unexpected weight change.  Cardiovascular: Negative for chest pain.  Musculoskeletal: Positive for arthralgias and back pain.       Back pain, taking ibuprofen, goes down the leg   Per HPI unless specifically indicated above     Objective:    BP 128/76   Pulse 80   Temp 98 F (36.7 C)   Ht 5' 4.5" (1.638 m)   Wt 273 lb 1.6 oz (123.9 kg)   SpO2 93%   BMI 46.15 kg/m   Wt Readings from Last 3 Encounters:  11/18/18 273 lb 1.6 oz (123.9 kg)  09/15/18 264 lb 12.8 oz (120.1 kg)  08/19/18 261 lb 8 oz (118.6 kg)    Physical Exam Constitutional:      General: She is not in acute distress.    Appearance: She is well-developed. She is not diaphoretic.  HENT:     Head: Normocephalic and atraumatic.  Eyes:     General: No scleral icterus. Neck:     Thyroid: No thyromegaly.  Cardiovascular:     Rate and Rhythm: Normal rate and regular rhythm.     Heart sounds: Normal heart sounds. No murmur.  Pulmonary:     Effort: Pulmonary effort is normal. No respiratory distress.     Breath sounds: Normal breath sounds. No wheezing.  Abdominal:     General: Bowel sounds are normal. There is no distension.     Palpations: Abdomen is soft.  Skin:    General: Skin is warm and dry.     Coloration: Skin is not pale.  Neurological:     Mental Status: She is alert.     Motor: Tremor (Parkinsonian) present.  Psychiatric:        Behavior: Behavior normal.  Thought  Content: Thought content normal.        Judgment: Judgment normal.    Diabetic Foot Form - Detailed   Diabetic Foot Exam - detailed Diabetic Foot exam was performed with the following findings:  Yes 11/18/2018  9:00 AM  Visual Foot Exam completed.:  Yes  Pulse Foot Exam completed.:  Yes  Right Dorsalis Pedis:  Present Left Dorsalis Pedis:  Present  Sensory Foot Exam Completed.:  Yes Semmes-Weinstein Monofilament Test R Site 1-Great Toe:  Pos L Site 1-Great Toe:  Pos         Results for orders placed or performed during the hospital encounter of 08/25/18  Gastrointestinal Panel by PCR , Stool  Result Value Ref Range   Campylobacter species NOT DETECTED NOT DETECTED   Plesimonas shigelloides NOT DETECTED NOT DETECTED   Salmonella species DETECTED (A) NOT DETECTED   Yersinia enterocolitica NOT DETECTED NOT DETECTED   Vibrio species NOT DETECTED NOT DETECTED   Vibrio cholerae NOT DETECTED NOT DETECTED   Enteroaggregative E coli (EAEC) NOT DETECTED NOT DETECTED   Enteropathogenic E coli (EPEC) NOT DETECTED NOT DETECTED   Enterotoxigenic E coli (ETEC) NOT DETECTED NOT DETECTED   Shiga like toxin producing E coli (STEC) NOT DETECTED NOT DETECTED   Shigella/Enteroinvasive E coli (EIEC) NOT DETECTED NOT DETECTED   Cryptosporidium NOT DETECTED NOT DETECTED   Cyclospora cayetanensis NOT DETECTED NOT DETECTED   Entamoeba histolytica NOT DETECTED NOT DETECTED   Giardia lamblia NOT DETECTED NOT DETECTED   Adenovirus F40/41 NOT DETECTED NOT DETECTED   Astrovirus NOT DETECTED NOT DETECTED   Norovirus GI/GII NOT DETECTED NOT DETECTED   Rotavirus A NOT DETECTED NOT DETECTED   Sapovirus (I, II, IV, and V) NOT DETECTED NOT DETECTED      Assessment & Plan:   Problem List Items Addressed This Visit      Cardiovascular and Mediastinum   Uncontrolled diabetes mellitus type 2 with atherosclerosis of arteries of extremities (HCC)    Patient is interested in Ozempic; would love to decrease her  insulin      Relevant Orders   Microalbumin / creatinine urine ratio   Lipid panel   Hemoglobin A1c   BP (high blood pressure)    Controlled; continue regimen; she will monitor at home and call if trending up or down; DASH guidelines encouraged; she will be trying to work on weight loss      Abdominal aortic atherosclerosis (HCC)    Saturated fats are now a rare treat instead of more regular intake; praise given; limit saturated fats; see if the LDL has dropped with dietary changes        Respiratory   Obstructive sleep apnea    Getting fitted for BiPAP, Dr. Sherryll Burger        Other   Parkinsonian tremor [facility])    Managed by neurologist      Myalgia    Unable to tolerate statins      Bipolar disorder (HCC)    Refer to psychiatrist; no SI/HI; encouragement given      Relevant Orders   Ambulatory referral to Psychiatry   Adverse reaction to statin medication    Intolerant to statins          Follow up plan: Return in about 3 months (around 02/17/2019) for follow-up visit with Dr. Sherie Don (or just AFTER).  An after-visit summary was printed and given to the patient at check-out.  Please see the patient instructions which may contain other information and  recommendations beyond what is mentioned above in the assessment and plan.  No orders of the defined types were placed in this encounter.   Orders Placed This Encounter  Procedures  . Microalbumin / creatinine urine ratio  . Lipid panel  . Hemoglobin A1c  . Ambulatory referral to Psychiatry

## 2018-11-18 NOTE — Assessment & Plan Note (Signed)
Unable to tolerate statins 

## 2018-11-18 NOTE — Assessment & Plan Note (Signed)
Controlled; continue regimen; she will monitor at home and call if trending up or down; DASH guidelines encouraged; she will be trying to work on weight loss

## 2018-11-18 NOTE — Patient Instructions (Addendum)
Let's get labs We'll start Ozempic once I see your numbers With that, we'll plan to decrease your insulin Try to limit saturated fats in your diet (bologna, hot dogs, barbeque, cheeseburgers, hamburgers, steak, bacon, sausage, cheese, etc.) and get more fresh fruits, vegetables, and whole grains Check out the information at familydoctor.org entitled "Nutrition for Weight Loss: What You Need to Know about Fad Diets" Try to lose between 1-2 pounds per week by taking in fewer calories and burning off more calories You can succeed by limiting portions, limiting foods dense in calories and fat, becoming more active, and drinking 8 glasses of water a day (64 ounces) Don't skip meals, especially breakfast, as skipping meals may alter your metabolism Do not use over-the-counter weight loss pills or gimmicks that claim rapid weight loss A healthy BMI (or body mass index) is between 18.5 and 24.9 You can calculate your ideal BMI at the NIH website JobEconomics.hu  Try turmeric as a natural anti-inflammatory (for pain and arthritis). It comes in capsules where you buy aspirin and fish oil, but also as a spice where you buy pepper and garlic powder.  Preventing Unhealthy Weight Gain, Adult Staying at a healthy weight is important. When fat builds up in your body, you may become overweight or obese. These conditions put you at greater risk for developing certain health problems, such as heart disease, diabetes, sleeping problems, joint problems, and some cancers. Unhealthy weight gain is often the result of making unhealthy choices in what you eat. It is also a result of not getting enough exercise. You can make changes to your lifestyle to prevent obesity and stay as healthy as possible. What nutrition changes can be made? To maintain a healthy weight and prevent obesity:  Eat only as much as your body needs. To do this: ? Pay attention to signs that you  are hungry or full. Stop eating as soon as you feel full. ? If you feel hungry, try drinking water first. Drink enough water so your urine is clear or pale yellow. ? Eat smaller portions. ? Look at serving sizes on food labels. Most foods contain more than one serving per container. ? Eat the recommended amount of calories for your gender and activity level. While most active people should eat around 2,000 calories per day, if you are trying to lose weight or are not very active, you main need to eat less calories. Talk to your health care provider or dietitian about how many calories you should eat each day.  Choose healthy foods, such as: ? Fruits and vegetables. Try to fill at least half of your plate at each meal with fruits and vegetables. ? Whole grains, such as whole wheat bread, brown rice, and quinoa. ? Lean meats, such as chicken or fish. ? Other healthy proteins, such as beans, eggs, or tofu. ? Healthy fats, such as nuts, seeds, fatty fish, and olive oil. ? Low-fat or fat-free dairy.  Check food labels and avoid food and drinks that: ? Are high in calories. ? Have added sugar. ? Are high in sodium. ? Have saturated fats or trans fats.  Limit how much you eat of the following foods: ? Prepackaged meals. ? Fast food. ? Fried foods. ? Processed meat, such as bacon, sausage, and deli meats. ? Fatty cuts of red meat and poultry with skin.  Cook foods in healthier ways, such as by baking, broiling, or grilling.  When grocery shopping, try to shop around the outside of the store.  This helps you buy mostly fresh foods and avoid canned and prepackaged foods.  What lifestyle changes can be made?  Exercise at least 30 minutes 5 or more days each week. Exercising includes brisk walking, yard work, biking, running, swimming, and team sports like basketball and soccer. Ask your health care provider which exercises are safe for you.  Do not use any products that contain nicotine or  tobacco, such as cigarettes and e-cigarettes. If you need help quitting, ask your health care provider.  Limit alcohol intake to no more than 1 drink a day for nonpregnant women and 2 drinks a day for men. One drink equals 12 oz of beer, 5 oz of wine, or 1 oz of hard liquor.  Try to get 7-9 hours of sleep each night. What other changes can be made?  Keep a food and activity journal to keep track of: ? What you ate and how many calories you had. Remember to count sauces, dressings, and side dishes. ? Whether you were active, and what exercises you did. ? Your calorie, weight, and activity goals.  Check your weight regularly. Track any changes. If you notice you have gained weight, make changes to your diet or activity routine.  Avoid taking weight-loss medicines or supplements. Talk to your health care provider before starting any new medicine or supplement.  Talk to your health care provider before trying any new diet or exercise plan. Why are these changes important? Eating healthy, staying active, and having healthy habits not only help prevent obesity, they also:  Help you to manage stress and emotions.  Help you to connect with friends and family.  Improve your self-esteem.  Improve your sleep.  Prevent long-term health problems.  What can happen if changes are not made? Being obese or overweight can cause you to develop joint or bone problems, which can make it hard for you to stay active or do activities you enjoy. Being obese or overweight also puts stress on your heart and lungs and can lead to health problems like diabetes, heart disease, and some cancers. Where to find more information: Talk with your health care provider or a dietitian about healthy eating and healthy lifestyle choices. You may also find other information through these resources:  U.S. Department of Agriculture MyPlate: https://ball-collins.biz/www.choosemyplate.gov  American Heart Association: www.heart.org  Centers for  Disease Control and Prevention: FootballExhibition.com.brwww.cdc.gov  Summary  Staying at a healthy weight is important. It helps prevent certain diseases and health problems, such as heart disease, diabetes, joint problems, sleep disorders, and some cancers.  Being obese or overweight can cause you to develop joint or bone problems, which can make it hard for you to stay active or do activities you enjoy.  You can prevent unhealthy weight gain by eating a healthy diet, exercising regularly, not smoking, limiting alcohol, and getting enough sleep.  Talk with your health care provider or a dietitian for guidance about healthy eating and healthy lifestyle choices. This information is not intended to replace advice given to you by your health care provider. Make sure you discuss any questions you have with your health care provider. Document Released: 11/24/2016 Document Revised: 12/30/2016 Document Reviewed: 12/30/2016 Elsevier Interactive Patient Education  [employer]2018 Elsevier Inc.

## 2018-11-18 NOTE — Assessment & Plan Note (Signed)
Saturated fats are now a rare treat instead of more regular intake; praise given; limit saturated fats; see if the LDL has dropped with dietary changes

## 2018-11-18 NOTE — Assessment & Plan Note (Signed)
Patient is interested in Ozempic; would love to decrease her insulin

## 2018-11-18 NOTE — Assessment & Plan Note (Signed)
Managed by neurologist

## 2018-11-19 LAB — MICROALBUMIN / CREATININE URINE RATIO
Creatinine, Urine: 64 mg/dL (ref 20–275)
Microalb Creat Ratio: 3 mcg/mg creat (ref <30)
Microalb, Ur: 0.2 mg/dL

## 2018-11-19 LAB — LIPID PANEL
Cholesterol: 218 mg/dL — ABNORMAL HIGH (ref <200)
HDL: 45 mg/dL — AB (ref >50)
LDL Cholesterol (Calc): 147 mg/dL (calc) — ABNORMAL HIGH
NON-HDL CHOLESTEROL (CALC): 173 mg/dL — AB (ref <130)
TRIGLYCERIDES: 139 mg/dL (ref <150)
Total CHOL/HDL Ratio: 4.8 (calc) (ref <5.0)

## 2018-11-19 LAB — HEMOGLOBIN A1C
EAG (MMOL/L): 10.3 (calc)
Hgb A1c MFr Bld: 8.1 % of total Hgb — ABNORMAL HIGH (ref <5.7)
Mean Plasma Glucose: 186 (calc)

## 2018-11-23 ENCOUNTER — Encounter: Payer: Managed Care, Other (non HMO) | Admitting: Internal Medicine

## 2018-11-23 DIAGNOSIS — L02211 Cutaneous abscess of abdominal wall: Secondary | ICD-10-CM | POA: Diagnosis not present

## 2018-11-24 NOTE — Progress Notes (Signed)
REINA, WILTON ([id number]) Visit Report for 11/23/2018 Debridement Details Patient Name: Debra [name], Debra [name]. Date of Service: 11/23/2018 2:00 PM Medical Record Number: [id number] Patient Account Number: [id number] Date of Birth/Sex: [birth year] (57 y.o. F) Treating RN: Huel Coventry Primary Care Provider: Baruch Gouty Other Clinician: Referring Provider: Baruch Gouty Treating Provider/Extender: Altamese [place] in Treatment: 2 Debridement Performed for Wound #3 Distal Abdomen - Lower Quadrant Assessment: Performed By: Physician Maxwell Caul, MD Debridement Type: Debridement Level of Consciousness (Pre- Awake and Alert procedure): Pre-procedure Verification/Time Yes - 14:03 Out Taken: Start Time: 14:03 Pain Control: Lidocaine Total Area Debrided (L x W): 0.5 (cm) x 0.9 (cm) = 0.45 (cm) Tissue and other material Viable, Subcutaneous debrided: Level: Skin/Subcutaneous Tissue Debridement Description: Excisional Instrument: Curette Bleeding: Minimum Hemostasis Achieved: Pressure End Time: 14:06 Response to Treatment: Procedure was tolerated well Level of Consciousness Awake and Alert (Post-procedure): Post Debridement Measurements of Total Wound Length: (cm) 0.5 Width: (cm) 0.9 Depth: (cm) 0.1 Volume: (cm) 0.035 Character of Wound/Ulcer Post Debridement: Requires Further Debridement Post Procedure Diagnosis Same as Pre-procedure Electronic Signature(s) Signed: 11/23/2018 5:32:05 PM By: Elliot Gurney, BSN, RN, CWS, Mende RN, BSN Signed: 11/23/2018 5:55:34 PM By: Baltazar Najjar MD Entered By: Baltazar Najjar on 11/23/2018 15:01:24 Debra [name] ([id number]) -------------------------------------------------------------------------------- HPI Details Patient Name: Debra [name]. Date of Service: 11/23/2018 2:00 PM Medical Record Number: [id number] Patient Account Number: [id number] Date of Birth/Sex: 10-Feb-1961 (57 y.o. 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 She tells me everything was fine up until [name] week ago. She noticed pain in her left groin area. By Saturday this it started to drain. She applied black salve to this area which she has done in the past and is helped heal problems in this area. This did not work. She was seen in the ER on 03/27/18 with an abscess in the left groin. Noted to have surrounding cellulitis and an indurated area. She underwent an IandD. She was started on patient clindamycin. Her white count was 11.9. Comprehensive metabolic panel normal lactic acid level normal at 1.2. They did not do [name] culture that I can see. She was seen yesterday at her primary physician's office she may have been put on cephalexin at that point. She is using iodoform packing The patient is [name] type II diabetic with [name] history of PAD last hemoglobin A1c of 7.3. She has [name] history of parkinsonism, COPD, bipolar disorder hypertension. She is [name] continued 1 pack per day smoker She tells me that she does have [name] history of recurrent abscesses in this area last about [name] year ago. Mostly she seems to care for these herself and they seem to go away. 04/06/18; patient's surgical IandD on the lower left abdominal quadrant just above the symphysis pubis appears better. Following is better we've been using silver alginate. Culture I did last week did not grow MRSA rather Enterococcus faecalis. I substituted Augmentin for the clindamycin however the patient didn't have enough money, however she will pick it up this morning 04/13/18; surgical IandD on the lower left abdominal quadrant just above the symphysis pubis. Surface of this area looks healthy. This is an oval-shaped wound lying  horizontally. Medially it has [name], all with about 0.9 cm in depth  there is no drainage no surrounding erythema. She is completing the Augmentin I gave her last week 04/27/18; surgical IandD site on the lower left abdominal quadrant just above the symphysis pubis. Surface of this wound is improved dimensions are better. We've been using silver alginate and border foam. She tells me she developed C. difficile has been treated with oral vancomycin. Will need to be very judicious with any further antibiotics 05/04/18; surgical IandD site on the lower left abdomen just above the symphysis pubis. All of this is closed over except [name] small slit like area which is probably where the sinus tract was. There is no drainage no tenderness. 05/11/18; surgical IandD site on the left lower abdomen just above the symphysis pubis. All of this is closed. There is no open area here. No drainage and no tenderness she does have [name] history of abscesses but apparently none that have had difficulty healing like this one READMISSION 11/09/18 This is [name] patient we know from previous stays in this clinic. She apparently has [name] history of recurrent Cutaneous abscesses. We had her in the clinic last time with [name] lower abdomen/inguinal area abscess that required [name] surgical IandD. We managed to get this to close over. She also lives in fear of recurrent C. difficile and does not want systemic antibiotics. She tells [place] that she developed [name] small abscess in the wound area about 2-3 weeks ago. This spontaneously ruptured and had purulent material maintained. She is here for our review of the resultant wound 11/23/2018; interestingly the area of the opened for which we saw her 2 weeks ago has closed. This was likely an abscess given the clinical description although we did not really see it at the beginning. Over the timeframe since we have seen here she developed [name] second area just underneath the original one from last week. As her friend  describes this it is [name] purple papule that is painful, develops purulence ruptures and then heals. Right now it is [name] flat open area. Electronic Signature(s) Signed: 11/23/2018 5:55:34 PM By: Baltazar Najjar MD Debra [name] ([id number]) Entered By: Baltazar Najjar on 11/23/2018 15:03:17 Debra [name] ([id number]) -------------------------------------------------------------------------------- Physical Exam Details Patient Name: Debra [name] Date of Service: 11/23/2018 2:00 PM Medical Record Number: [id number] Patient Account Number: [id number] Date of Birth/Sex: 12/24/1960 (57 y.o. F) Treating RN: Huel Coventry Primary Care Provider: Baruch Gouty Other Clinician: Referring Provider: Baruch Gouty Treating Provider/Extender: Altamese [place] in Treatment: 2 Constitutional Patient is hypertensive.. Pulse regular and within target range for patient.[name] Respirations regular, non-labored and within target range.. Temperature is normal and within the target range for the patient.[name] appears in no distress. Notes Wound exam; right inner lower mid abdomen just above her symphysis pubis. The original wound is healed very close to this is the new open area. This does not have [name] viable surface. Using [name] #3 curette necrotic debris was removed there is no purulence no depth and no surrounding erythema. Hemostasis was with direct pressure Electronic Signature(s) Signed: 11/23/2018 5:55:34 PM By: Baltazar Najjar MD Entered By: Baltazar Najjar on 11/23/2018 15:04:25 Debra [name] ([id number]) -------------------------------------------------------------------------------- Physician Orders Details Patient Name: Debra [name] Date of Service: 11/23/2018 2:00 PM Medical Record Number: [id number] Patient Account Number: [id number] Date of Birth/Sex: 1961-08-05 (57 y.o. F) Treating RN: Huel Coventry Primary Care Provider: Baruch Gouty Other Clinician: Referring Provider: Baruch Gouty Treating  Provider/Extender: Altamese [place] in Treatment: 2 Verbal / Phone Orders: No Diagnosis Coding Wound Cleansing  Wound #3 Distal Abdomen - Lower Quadrant o Clean wound with Normal Saline. Anesthetic (add to Medication List) Wound #3 Distal Abdomen - Lower Quadrant o Topical Lidocaine 4% cream applied to wound bed prior to debridement (In Clinic Only). Primary Wound Dressing Wound #3 Distal Abdomen - Lower Quadrant o Silver Alginate Secondary Dressing Wound #3 Distal Abdomen - Lower Quadrant o Boardered Foam Dressing Dressing Change Frequency Wound #3 Distal Abdomen - Lower Quadrant o Three times weekly Follow-up Appointments Wound #3 Distal Abdomen - Lower Quadrant o Return Appointment in 3 weeks. Electronic Signature(s) Signed: 11/23/2018 5:32:05 PM By: Elliot Gurney, BSN, RN, CWS, Melaney RN, BSN Signed: 11/23/2018 5:55:34 PM By: Baltazar Najjar MD Entered By: Elliot Gurney, BSN, RN, CWS, Nori on 11/23/2018 14:08:36 Debra [name] ([id number]) -------------------------------------------------------------------------------- Problem List Details Patient Name: ALANNA, STORTI. Date of Service: 11/23/2018 2:00 PM Medical Record Number: [id number] Patient Account Number: [id number] Date of Birth/Sex: December 27, 1960 (57 y.o. F) Treating RN: Huel Coventry Primary Care Provider: Baruch Gouty Other Clinician: Referring Provider: Baruch Gouty Treating Provider/Extender: Altamese [place] in Treatment: 2 Active Problems ICD-10 Evaluated Encounter Code Description Active Date Today Diagnosis S31.109S Unspecified open wound of abdominal wall, unspecified 11/09/2018 No Yes quadrant without penetration into peritoneal cavity, sequela L02.211 Cutaneous abscess of abdominal wall 11/09/2018 No Yes E11.622 Type 2 diabetes mellitus with other skin ulcer 11/09/2018 No Yes Inactive Problems Resolved Problems Electronic Signature(s) Signed: 11/23/2018 5:55:34 PM By: Baltazar Najjar MD Entered By:  Baltazar Najjar on 11/23/2018 15:00:48 Debra [name] ([id number]) -------------------------------------------------------------------------------- Progress Note Details Patient Name: Debra [name] Date of Service: 11/23/2018 2:00 PM Medical Record Number: [id number] Patient Account Number: [id number] Date of Birth/Sex: September 16, 1961 (57 y.o. 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 She tells me everything was fine up until [name] week ago. She noticed pain in her left groin area. By Saturday this it started to drain. She applied black salve to this area which she has done in the past and is helped heal problems in this area. This did not work. She was seen in the ER on 03/27/18 with an abscess in the left groin. Noted to have surrounding cellulitis and an indurated area. She underwent an IandD. She was started on patient clindamycin. Her white count was 11.9. Comprehensive metabolic panel normal lactic acid level normal at 1.2. They did not do [name] culture that I can see. She was seen yesterday at her primary physician's office she may have been put on cephalexin at that point. She is using iodoform packing The patient is [name] type II diabetic with [name] history of PAD last hemoglobin A1c of 7.3. She has [name] history of parkinsonism, COPD, bipolar disorder hypertension. She is [name] continued 1 pack per day smoker She tells me that she does have [name] history of recurrent abscesses in this area last about [name] year ago. Mostly she seems to care for these herself and they seem to go away. 04/06/18; patient's surgical IandD on the lower left abdominal quadrant just above the symphysis pubis appears better. Following is better we've been  using silver alginate. Culture I did last week did not grow MRSA rather Enterococcus faecalis. I substituted Augmentin for the clindamycin however the patient didn't have enough money, however she will  pick it up this morning 04/13/18; surgical IandD on the lower left abdominal quadrant just above the symphysis pubis. Surface of this area looks healthy. This is an oval-shaped wound lying horizontally. Medially it has [name], all with about 0.9 cm in depth there is no drainage no surrounding erythema. She is completing the Augmentin I gave her last week 04/27/18; surgical IandD site on the lower left abdominal quadrant just above the symphysis pubis. Surface of this wound is improved dimensions are better. We've been using silver alginate and border foam. She tells me she developed C. difficile has been treated with oral vancomycin. Will need to be very judicious with any further antibiotics 05/04/18; surgical IandD site on the lower left abdomen just above the symphysis pubis. All of this is closed over except [name] small slit like area which is probably where the sinus tract was. There is no drainage no tenderness. 05/11/18; surgical IandD site on the left lower abdomen just above the symphysis pubis. All of this is closed. There is no open area here. No drainage and no tenderness she does have [name] history of abscesses but apparently none that have had difficulty healing like this one READMISSION 11/09/18 This is [name] patient we know from previous stays in this clinic. She apparently has [name] history of recurrent Cutaneous abscesses. We had her in the clinic last time with [name] lower abdomen/inguinal area abscess that required [name] surgical IandD. We managed to get this to close over. She also lives in fear of recurrent C. difficile and does not want systemic antibiotics. She tells us that she developed [name] small abscess in the wound area about 2-3 weeks ago. This spontaneously ruptured and had purulent material maintained.  She is here for our review of the resultant wound 11/23/2018; interestingly the area of the opened for which we saw her 2 weeks ago has closed. This was likely an abscess given the clinical description although we did not really see it at the beginning. Over the timeframe since we have seen here she developed [name] second area just underneath the original one from last week. As her friend describes this it is [name] purple papule that is painful, develops purulence ruptures and then heals. Right now it is [name] flat open area. Debra BowieKANE, Debra [name]. ([id number]) Objective Constitutional Patient is hypertensive.. Pulse regular and within target range for patient.[name]. Respirations regular, non-labored and within target range.. Temperature is normal and within the target range for the patient.[name]. appears in no distress. Vitals Time Taken: 1:43 PM, Height: 65 in, Weight: 230 lbs, BMI: 38.3, Temperature: 98.1 F, Pulse: 67 bpm, Respiratory Rate: 18 breaths/min, Blood Pressure: 159/78 mmHg. General Notes: Wound exam; right inner lower mid abdomen just above her symphysis pubis. The original wound is healed very close to this is the new open area. This does not have [name] viable surface. Using [name] #3 curette necrotic debris was removed there is no purulence no depth and no surrounding erythema. Hemostasis was with direct pressure Integumentary (Hair, Skin) Wound #2 status is Healed - Epithelialized. Original cause of wound was Bump. The wound is located on the Left Abdomen - Lower Quadrant. The wound measures 0cm length x 0cm width x 0cm depth; 0cm^2 area and 0cm^3 volume. There is no tunneling or undermining noted. There is [name] medium amount of serous drainage noted. The wound margin is flat and intact. There is no granulation within the wound bed. There is no necrotic tissue within the wound bed. The periwound skin appearance did  not exhibit: Callus, Crepitus, Excoriation, Induration, Rash, Scarring, Dry/Scaly, Maceration,  Atrophie Blanche, Cyanosis, Ecchymosis, Hemosiderin Staining, Mottled, Pallor, Rubor, Erythema. Periwound temperature was noted as No Abnormality. Wound #3 status is Open. Original cause of wound was Gradually Appeared. The wound is located on the Distal Abdomen - Lower Quadrant. The wound measures 0.5cm length x 0.9cm width x 0.1cm depth; 0.353cm^2 area and 0.035cm^3 volume. There is Fat Layer (Subcutaneous Tissue) Exposed exposed. There is no tunneling or undermining noted. There is [name] medium amount of serosanguineous drainage noted. The wound margin is flat and intact. There is small (1-33%) red granulation within the wound bed. There is [name] large (67-100%) amount of necrotic tissue within the wound bed including Adherent Slough. The periwound skin appearance did not exhibit: Callus, Crepitus, Excoriation, Induration, Rash, Scarring, Dry/Scaly, Maceration, Atrophie Blanche, Cyanosis, Ecchymosis, Hemosiderin Staining, Mottled, Pallor, Rubor, Erythema. Assessment Active Problems ICD-10 Unspecified open wound of abdominal wall, unspecified quadrant without penetration into peritoneal cavity, sequela Cutaneous abscess of abdominal wall Type 2 diabetes mellitus with other skin ulcer Procedures Wound #3 Debra [name], Debra [name] Kitchen. ([id number]) Pre-procedure diagnosis of Wound #3 is an Abscess located on the Distal Abdomen - Lower Quadrant . There was [name] Excisional Skin/Subcutaneous Tissue Debridement with [name] total area of 0.45 sq cm performed by Maxwell CaulOBSON, Fionna Merriott G, MD. With the following instrument(s): Curette to remove Viable tissue/material. Material removed includes Subcutaneous Tissue after achieving pain control using Lidocaine. No specimens were taken. [name] time out was conducted at 14:03, prior to the start of the procedure. [name] Minimum amount of bleeding was controlled with Pressure. The procedure was tolerated well. Post Debridement Measurements: 0.5cm length x 0.9cm width x 0.1cm depth; 0.035cm^3  volume. Character of Wound/Ulcer Post Debridement requires further debridement. Post procedure Diagnosis Wound #3: Same as Pre-Procedure Plan Wound Cleansing: Wound #3 Distal Abdomen - Lower Quadrant: Clean wound with Normal Saline. Anesthetic (add to Medication List): Wound #3 Distal Abdomen - Lower Quadrant: Topical Lidocaine 4% cream applied to wound bed prior to debridement (In Clinic Only). Primary Wound Dressing: Wound #3 Distal Abdomen - Lower Quadrant: Silver Alginate Secondary Dressing: Wound #3 Distal Abdomen - Lower Quadrant: Boardered Foam Dressing Dressing Change Frequency: Wound #3 Distal Abdomen - Lower Quadrant: Three times weekly Follow-up Appointments: Wound #3 Distal Abdomen - Lower Quadrant: Return Appointment in 3 weeks. 1. She is allergic to Polysporin and Neosporin. I think she should continue silver alginate 2. There is [name] differential to this which would include panniculitis or something exotic like this however for the most part these heal. She states she has had these since 2004 and mostly they have been on her abdomen although she thinks she had one on her left leg. Electronic Signature(s) Signed: 11/23/2018 5:55:34 PM By: Baltazar Najjarobson, Kierra Jezewski MD Entered By: Baltazar Najjarobson, Donel Osowski on 11/23/2018 15:05:35 Debra BowieKANE, Debra [name]. ([id number]) -------------------------------------------------------------------------------- SuperBill Details Patient Name: Debra BowieKANE, Debra [name]. Date of Service: 11/23/2018 Medical Record Number: [id number] Patient Account Number: [id number] Date of Birth/Sex: 07/21/1961 (57 y.o. F) Treating RN: Huel CoventryWoody, Stephinie Primary Care Provider: Baruch GoutyLada, Melinda Other Clinician: Referring Provider: Baruch GoutyLada, Melinda Treating Provider/Extender: Altamese CarolinaOBSON, Kyleigh Nannini G Weeks in Treatment: 2 Diagnosis Coding ICD-10 Codes Code Description Unspecified open wound of abdominal wall, unspecified quadrant without penetration into peritoneal S31.109S cavity, sequela L02.211 Cutaneous  abscess of abdominal wall E11.622 Type 2 diabetes mellitus with other skin ulcer Facility Procedures CPT4 Code: [id number] Description: 11042 - DEB SUBQ TISSUE 20 SQ CM/< ICD-10 Diagnosis Description L02.211 Cutaneous abscess of abdominal wall E11.622 Type 2 diabetes mellitus  with other skin ulcer Modifier: Quantity: 1 Physician Procedures CPT4 Code: [id number] Description: 11042 - WC PHYS SUBQ TISS 20 SQ CM ICD-10 Diagnosis Description L02.211 Cutaneous abscess of abdominal wall E11.622 Type 2 diabetes mellitus with other skin ulcer Modifier: Quantity: 1 Electronic Signature(s) Signed: 11/23/2018 5:55:34 PM By: Baltazar Najjar MD Entered By: Baltazar Najjar on 11/23/2018 15:05:52

## 2018-11-25 ENCOUNTER — Other Ambulatory Visit: Payer: Self-pay | Admitting: Family Medicine

## 2018-11-25 MED ORDER — PIOGLITAZONE HCL 45 MG PO TABS: 45.0000 mg | ORAL_TABLET | Freq: Every day | ORAL | 5 refills | Status: DC

## 2018-11-25 NOTE — Progress Notes (Signed)
Increase actos

## 2018-11-29 ENCOUNTER — Encounter: Payer: Self-pay | Admitting: Family Medicine

## 2018-11-29 DIAGNOSIS — E1151 Type 2 diabetes mellitus with diabetic peripheral angiopathy without gangrene: Secondary | ICD-10-CM

## 2018-11-29 DIAGNOSIS — I70209 Unspecified atherosclerosis of native arteries of extremities, unspecified extremity: Principal | ICD-10-CM

## 2018-11-29 DIAGNOSIS — R0789 Other chest pain: Secondary | ICD-10-CM

## 2018-11-29 DIAGNOSIS — IMO0002 Reserved for concepts with insufficient information to code with codable children: Secondary | ICD-10-CM

## 2018-11-29 DIAGNOSIS — E1165 Type 2 diabetes mellitus with hyperglycemia: Principal | ICD-10-CM

## 2018-11-29 MED ORDER — COLESEVELAM HCL 3.75 G PO PACK: 0.5000 | PACK | Freq: Every day | ORAL | 11 refills | Status: DC

## 2018-11-29 MED ORDER — PIOGLITAZONE HCL 30 MG PO TABS: 30.0000 mg | ORAL_TABLET | Freq: Every day | ORAL | 3 refills | Status: DC

## 2018-11-29 MED ORDER — LISINOPRIL 30 MG PO TABS: 30.0000 mg | ORAL_TABLET | Freq: Every day | ORAL | 0 refills | Status: DC

## 2018-11-29 MED ORDER — DAPAGLIFLOZIN PROPANEDIOL 5 MG PO TABS: 5.0000 mg | ORAL_TABLET | Freq: Every day | ORAL | 0 refills | Status: DC

## 2018-11-29 NOTE — Telephone Encounter (Signed)
I called patient personally after seeing her note about the Actos I was not aware that she had those symptoms at 45 mg and was told to decrease to 30 mg She says she id not gain a bunch of weight, no leg swelling; did not sound like heart failure she thinks She experienced heaviness in her chest and then would do something and her heart was pounding out of her chest She does not have a cardiologist Refer to cardiologist Continue daily aspirin Call 911 if symptoms return to suggest heart attack On 30 mg, she is okay and currently asymptomatic  She is interested in Ozempic No personal or fam hx of MEN-2 or medullary thyroid carcinoma The Victoza gave her pancreatitis, so I'm not going to give her Ozempic She took Januvia but it gave her diarrhea and stomach pain Invokana worked but note in the chart that it dropped her BP; she does not remember that Start low dose Farxiga, as it also has indication to reduce risk of hospitalization from heart failure in patients with type 2 diabetes and risk factors Decrease lisinopril to 30 mg daily instead of 40 mg daily to compensate for expected drop in pressure Check BP at home and stop lisinopril if BP trending low Return in 7-10 days after the change for BP check Asher MuirJamie or Cala BradfordKimberly Bring cuff and we'll compare ours versus hers  We talked about cholesterol; she cannot take statins or zetia; tried welchol but she had c diff at that time, so maybe that caused sx; she says she could try that again Start half of a packet just once a day; if that's gassy, then go to 1/4 packet daily

## 2018-11-30 NOTE — Progress Notes (Signed)
Debra [name], Damesha A. ([id number]) Visit Report for 11/23/2018 Arrival Information Details Patient Name: Debra [name], Debra A. Date of Service: 11/23/2018 2:00 PM Medical Record Number: [id number] Patient Account Number: [id number] Date of Birth/Sex: July 03, 1961 (57 y.o. F) Treating RN: Arnette NorrisBiell, Kristina Primary Care Siani Utke: Baruch GoutyLada, Melinda Other Clinician: Referring Amariyah Bazar: Baruch GoutyLada, Melinda Treating Mairin Lindsley/Extender: Altamese CarolinaOBSON, MICHAEL G Weeks in Treatment: 2 Visit Information History Since Last Visit Added or deleted any medications: No Patient Arrived: Ambulatory Any new allergies or adverse reactions: No Arrival Time: 13:39 Had a fall or experienced change in No Accompanied By: friend activities of daily living that may affect Transfer Assistance: Stretcher risk of falls: Patient Identification Verified: Yes Signs or symptoms of abuse/neglect since last visito No Secondary Verification Process Yes Hospitalized since last visit: No Completed: Has Dressing in Place as Prescribed: Yes Patient Has Alerts: Yes Pain Present Now: No Patient Alerts: Patient on Blood Thinner aspirin 81 Type II DM Electronic Signature(s) Signed: 11/29/2018 1:37:43 PM By: Arnette NorrisBiell, Kristina Entered By: Arnette NorrisBiell, Kristina on 11/23/2018 13:42:33 Debra [name], Debra A. ([id number]) -------------------------------------------------------------------------------- Encounter Discharge Information Details Patient Name: Debra [name], Debra A. Date of Service: 11/23/2018 2:00 PM Medical Record Number: [id number] Patient Account Number: [id number] Date of Birth/Sex: July 03, 1961 (57 y.o. F) Treating RN: Huel CoventryWoody, Danyele Primary Care Sawyer Kahan: Baruch GoutyLada, Melinda Other Clinician: Referring Savoy [place]: Baruch GoutyLada, Melinda Treating Tareq Dwan/Extender: Altamese CarolinaOBSON, MICHAEL G Weeks in Treatment: 2 Encounter Discharge Information Items Post Procedure Vitals Discharge Condition: Stable Temperature (F): 98.1 Ambulatory Status: Ambulatory Pulse (bpm): 67 Discharge Destination:  Home Respiratory Rate (breaths/min): 18 Transportation: Private Auto Blood Pressure (mmHg): 159/78 Accompanied By: friend Schedule Follow-up Appointment: Yes Clinical Summary of Care: Electronic Signature(s) Signed: 11/23/2018 5:32:05 PM By: Elliot GurneyWoody, BSN, RN, CWS, Ceri RN, BSN Entered By: Elliot GurneyWoody, BSN, RN, CWS, Iszabella on 11/23/2018 14:11:36 Debra [name], Debra A. ([id number]) -------------------------------------------------------------------------------- Lower Extremity Assessment Details Patient Name: Debra [name], Debra A. Date of Service: 11/23/2018 2:00 PM Medical Record Number: [id number] Patient Account Number: [id number] Date of Birth/Sex: July 03, 1961 (57 y.o. F) Treating RN: Arnette NorrisBiell, Kristina Primary Care Ranay Ketter: Baruch GoutyLada, Melinda Other Clinician: Referring Mykaela Arena: Baruch GoutyLada, Melinda Treating Jaylon Boylen/Extender: Altamese CarolinaOBSON, MICHAEL G Weeks in Treatment: 2 Electronic Signature(s) Signed: 11/29/2018 1:37:43 PM By: Arnette NorrisBiell, Kristina Entered By: Arnette NorrisBiell, Kristina on 11/23/2018 13:44:38 Debra [name], Debra A. ([id number]) -------------------------------------------------------------------------------- Multi Wound Chart Details Patient Name: Debra [name], Debra A. Date of Service: 11/23/2018 2:00 PM Medical Record Number: [id number] Patient Account Number: [id number] Date of Birth/Sex: July 03, 1961 (57 y.o. F) Treating RN: Huel CoventryWoody, Demetra Primary Care Ermal Brzozowski: Baruch GoutyLada, Melinda Other Clinician: Referring Kynadie Yaun: Baruch GoutyLada, Melinda Treating Arielis Leonhart/Extender: Altamese CarolinaOBSON, MICHAEL G Weeks in Treatment: 2 Vital Signs Height(in): 65 Pulse(bpm): 67 Weight(lbs): 230 Blood Pressure(mmHg): 159/78 Body Mass Index(BMI): 38 Temperature(F): 98.1 Respiratory Rate 18 (breaths/min): Photos: [2:No Photos] [3:No Photos] [N/A:N/A] Wound Location: [2:Left Abdomen - Lower Quadrant] [3:Abdomen - Lower Quadrant - Distal] [N/A:N/A] Wounding Event: [2:Bump] [3:Gradually Appeared] [N/A:N/A] Primary Etiology: [2:Cyst] [3:Skin Tear] [N/A:N/A] Comorbid History:  [2:Hypertension, Type II Diabetes, Osteoarthritis] [3:Hypertension, Type II Diabetes, Osteoarthritis] [N/A:N/A] Date Acquired: [2:10/28/2018] [3:11/10/2018] [N/A:N/A] Weeks of Treatment: [2:2] [3:0] [N/A:N/A] Wound Status: [2:Healed - Epithelialized] [3:Open] [N/A:N/A] Measurements L x W x D [2:0x0x0] [3:0.5x0.9x0.1] [N/A:N/A] (cm) Area (cm) : [2:0] [3:0.353] [N/A:N/A] Volume (cm) : [2:0] [3:0.035] [N/A:N/A] % Reduction in Area: [2:100.00%] [3:N/A] [N/A:N/A] % Reduction in Volume: [2:100.00%] [3:N/A] [N/A:N/A] Classification: [2:Full Thickness Without Exposed Support Structures] [3:Full Thickness Without Exposed Support Structures] [N/A:N/A] Exudate Amount: [2:Medium] [3:Medium] [N/A:N/A] Exudate Type: [2:Serous] [3:Serosanguineous] [N/A:N/A] Exudate Color: [2:amber] [3:red, brown] [N/A:N/A] Wound Margin: [2:Flat and Intact] [3:Flat and Intact] [N/A:N/A] Granulation Amount: [2:None  Present (0%)] [3:Small (1-33%)] [N/A:N/A] Granulation Quality: [2:N/A] [3:Red] [N/A:N/A] Necrotic Amount: [2:None Present (0%)] [3:Large (67-100%)] [N/A:N/A] Exposed Structures: [2:Fascia: No Fat Layer (Subcutaneous Tissue) Exposed: No Tendon: No Muscle: No Joint: No Bone: No] [3:Fat Layer (Subcutaneous Tissue) Exposed: Yes Fascia: No Tendon: No Muscle: No Joint: No Bone: No] [N/A:N/A] Epithelialization: [2:Large (67-100%)] [3:None] [N/A:N/A] Debridement: [2:N/A] [3:Debridement - Excisional] [N/A:N/A] Pre-procedure [2:N/A] [3:14:03] [N/A:N/A] Verification/Time Out Taken: Pain Control: [2:N/A] [3:Lidocaine] [N/A:N/A] Tissue Debrided: N/A Subcutaneous N/A Level: N/A Skin/Subcutaneous Tissue N/A Debridement Area (sq cm): N/A 0.45 N/A Instrument: N/A Curette N/A Bleeding: N/A Minimum N/A Hemostasis Achieved: N/A Pressure N/A Debridement Treatment N/A Procedure was tolerated well N/A Response: Post Debridement N/A 0.5x0.9x0.1 N/A Measurements L x W x D (cm) Post Debridement Volume: N/A 0.035  N/A (cm) Periwound Skin Texture: Excoriation: No Excoriation: No N/A Induration: No Induration: No Callus: No Callus: No Crepitus: No Crepitus: No Rash: No Rash: No Scarring: No Scarring: No Periwound Skin Moisture: Maceration: No Maceration: No N/A Dry/Scaly: No Dry/Scaly: No Periwound Skin Color: Atrophie Blanche: No Atrophie Blanche: No N/A Cyanosis: No Cyanosis: No Ecchymosis: No Ecchymosis: No Erythema: No Erythema: No Hemosiderin Staining: No Hemosiderin Staining: No Mottled: No Mottled: No Pallor: No Pallor: No Rubor: No Rubor: No Temperature: No Abnormality N/A N/A Tenderness on Palpation: No No N/A Wound Preparation: Ulcer Cleansing: Ulcer Cleansing: N/A Rinsed/Irrigated with Saline Rinsed/Irrigated with Saline Topical Anesthetic Applied: Other: lidocaine 4% Procedures Performed: N/A Debridement N/A Treatment Notes Wound #3 (Distal Abdomen - Lower Quadrant) 2. Anesthetic Topical Lidocaine 4% cream to wound bed prior to debridement 4. Dressing Applied: Calcium Alginate with Silver 5. Secondary Dressing Applied Bordered Foam Dressing Electronic Signature(s) Signed: 11/23/2018 5:55:34 PM By: Baltazar Najjar MD Entered By: Baltazar Najjar on 11/23/2018 15:00:59 Debra [name] ([id number]) -------------------------------------------------------------------------------- Multi-Disciplinary Care Plan Details Patient Name: Debra [name]. Date of Service: 11/23/2018 2:00 PM Medical Record Number: [id number] Patient Account Number: [id number] Date of Birth/Sex: Feb 07, 1961 (57 y.o. F) Treating RN: Huel Coventry Primary Care Selby Slovacek: Baruch Gouty Other Clinician: Referring Honest Safranek: Baruch Gouty Treating Agam Davenport/Extender: Altamese [place] in Treatment: 2 Active Inactive Orientation to the Wound Care Program Nursing Diagnoses: Knowledge deficit related to the wound healing center program Goals: Patient/caregiver will verbalize understanding of  the Wound Healing Center Program Date Initiated: 11/09/2018 Target Resolution Date: 12/09/2018 Goal Status: Active Interventions: Provide education on orientation to the wound center Notes: Soft Tissue Infection Nursing Diagnoses: Impaired tissue integrity Potential for infection: soft tissue Goals: Patient will remain free of wound infection Date Initiated: 11/09/2018 Target Resolution Date: 12/09/2018 Goal Status: Active Interventions: Assess signs and symptoms of infection every visit Notes: Wound/Skin Impairment Nursing Diagnoses: Impaired tissue integrity Goals: Ulcer/skin breakdown will have a volume reduction of 30% by week 4 Date Initiated: 11/09/2018 Target Resolution Date: 12/10/2018 Goal Status: Active Interventions: RENDA, POHLMAN ([id number]) Assess ulceration(s) every visit Treatment Activities: Skin care regimen initiated : 11/09/2018 Notes: Electronic Signature(s) Signed: 11/23/2018 5:32:05 PM By: Elliot Gurney, BSN, RN, CWS, Kellyanne RN, BSN Entered By: Elliot Gurney, BSN, RN, CWS, Gracelynne on 11/23/2018 14:04:21 Debra [name] ([id number]) -------------------------------------------------------------------------------- Pain Assessment Details Patient Name: Debra [name]. Date of Service: 11/23/2018 2:00 PM Medical Record Number: [id number] Patient Account Number: [id number] Date of Birth/Sex: 10-21-1961 (57 y.o. F) Treating RN: Arnette Norris Primary Care Clarity Ciszek: Baruch Gouty Other Clinician: Referring Mahealani Sulak: Baruch Gouty Treating Jaques Mineer/Extender: Altamese [place] in Treatment: 2 Active Problems Location of Pain Severity and Description of Pain Patient Has Paino No Site Locations Pain  Management and Medication Current Pain Management: Electronic Signature(s) Signed: 11/29/2018 1:37:43 PM By: Arnette Norris Entered By: Arnette Norris on 11/23/2018 13:43:14 Debra [name]  ([id number]) -------------------------------------------------------------------------------- Patient/Caregiver Education Details Patient Name: Debra [name] Date of Service: 11/23/2018 2:00 PM Medical Record Number: [id number] Patient Account Number: [id number] Date of Birth/Gender: Jul 25, 1961 (57 y.o. F) Treating RN: Huel Coventry Primary Care Physician: Baruch Gouty Other Clinician: Referring Physician: Baruch Gouty Treating Physician/Extender: Altamese [place] in Treatment: 2 Education Assessment Education Provided To: Patient Education Topics Provided Wound/Skin Impairment: Handouts: Caring for Your Ulcer Methods: Demonstration, Explain/Verbal Responses: State content correctly Electronic Signature(s) Signed: 11/23/2018 5:32:05 PM By: Elliot Gurney, BSN, RN, CWS, Madaleine RN, BSN Entered By: Elliot Gurney, BSN, RN, CWS, Adabelle on 11/23/2018 14:09:40 Debra [name] ([id number]) -------------------------------------------------------------------------------- Wound Assessment Details Patient Name: Debra [name] Date of Service: 11/23/2018 2:00 PM Medical Record Number: [id number] Patient Account Number: [id number] Date of Birth/Sex: 04/19/1961 (57 y.o. F) Treating RN: Huel Coventry Primary Care Steffany Schoenfelder: Baruch Gouty Other Clinician: Referring Tesia Lybrand: Baruch Gouty Treating Joanathan Affeldt/Extender: Altamese [place] in Treatment: 2 Wound Status Wound Number: 2 Primary Etiology: Cyst Wound Location: Left Abdomen - Lower Quadrant Wound Status: Healed - Epithelialized Wounding Event: Bump Comorbid Hypertension, Type II Diabetes, History: Osteoarthritis Date Acquired: 10/28/2018 Weeks Of Treatment: 2 Clustered Wound: No Photos Photo Uploaded By: Arnette Norris on 11/23/2018 15:59:37 Wound Measurements Length: (cm) 0 % Debra [name] Width: (cm) 0 % Debra [name] Depth: (cm) 0 Epi Area: (cm) 0 Tu Volume: (cm) 0 Un eduction in Area: 100% eduction in Volume: 100% thelialization: Large (67-100%) nneling:  No dermining: No Wound Description Full Thickness Without Exposed Support Classification: Structures Wound Margin: Flat and Intact Exudate Medium Amount: Exudate Type: Serous Exudate Color: amber Foul Odor After Cleansing: No Slough/Fibrino No Wound Bed Granulation Amount: None Present (0%) Exposed Structure Necrotic Amount: None Present (0%) Fascia Exposed: No Fat Layer (Subcutaneous Tissue) Exposed: No Tendon Exposed: No Muscle Exposed: No Joint Exposed: No Bone Exposed: No BIANNCA, SCANTLIN A. ([id number]) Periwound Skin Texture Texture Color No Abnormalities Noted: No No Abnormalities Noted: No Callus: No Atrophie Blanche: No Crepitus: No Cyanosis: No Excoriation: No Ecchymosis: No Induration: No Erythema: No Rash: No Hemosiderin Staining: No Scarring: No Mottled: No Pallor: No Moisture Rubor: No No Abnormalities Noted: No Dry / Scaly: No Temperature / Pain Maceration: No Temperature: No Abnormality Wound Preparation Ulcer Cleansing: Rinsed/Irrigated with Saline Electronic Signature(s) Signed: 11/23/2018 5:32:05 PM By: Elliot Gurney, BSN, RN, CWS, Sherry RN, BSN Entered By: Elliot Gurney, BSN, RN, CWS, Briteny on 11/23/2018 14:05:35 Debra [name] ([id number]) -------------------------------------------------------------------------------- Wound Assessment Details Patient Name: Debra [name]. Date of Service: 11/23/2018 2:00 PM Medical Record Number: [id number] Patient Account Number: [id number] Date of Birth/Sex: [birth year] (57 y.o. F) Treating RN: Arnette Norris Primary Care Kowen Kluth: Baruch Gouty Other Clinician: Referring Adesuwa Osgood: Baruch Gouty Treating Dshaun Reppucci/Extender: Altamese [place] in Treatment: 2 Wound Status Wound Number: 3 Primary Etiology: Skin Tear Wound Location: Abdomen - Lower Quadrant - Distal Wound Status: Open Wounding Event: Gradually Appeared Comorbid Hypertension, Type II Diabetes, History: Osteoarthritis Date Acquired: 11/10/2018 Weeks Of  Treatment: 0 Clustered Wound: No Photos Photo Uploaded By: Arnette Norris on 11/23/2018 15:59:38 Wound Measurements Length: (cm) 0.5 Width: (cm) 0.9 Depth: (cm) 0.1 Area: (cm) 0.353 Volume: (cm) 0.035 % Reduction in Area: % Reduction in Volume: Epithelialization: None Tunneling: No Undermining: No Wound Description Full Thickness Without Exposed Support Classification: Structures Wound Margin: Flat and Intact Exudate Medium Amount: Exudate Type: Serosanguineous Exudate Color: red, brown Foul  Odor After Cleansing: No Slough/Fibrino Yes Wound Bed Granulation Amount: Small (1-33%) Exposed Structure Granulation Quality: Red Fascia Exposed: No Necrotic Amount: Large (67-100%) Fat Layer (Subcutaneous Tissue) Exposed: Yes Necrotic Quality: Adherent Slough Tendon Exposed: No Muscle Exposed: No Joint Exposed: No Bone Exposed: No Christin FudgeKANE, Merril A. ([id number]) Periwound Skin Texture Texture Color No Abnormalities Noted: No No Abnormalities Noted: No Callus: No Atrophie Blanche: No Crepitus: No Cyanosis: No Excoriation: No Ecchymosis: No Induration: No Erythema: No Rash: No Hemosiderin Staining: No Scarring: No Mottled: No Pallor: No Moisture Rubor: No No Abnormalities Noted: No Dry / Scaly: No Maceration: No Wound Preparation Ulcer Cleansing: Rinsed/Irrigated with Saline Topical Anesthetic Applied: Other: lidocaine 4%, Electronic Signature(s) Signed: 11/29/2018 1:37:43 PM By: Arnette NorrisBiell, Kristina Entered By: Arnette NorrisBiell, Kristina on 11/23/2018 13:56:00 Debra [name], Tykera A. ([id number]) -------------------------------------------------------------------------------- Vitals Details Patient Name: Debra [name], Shekera A. Date of Service: 11/23/2018 2:00 PM Medical Record Number: [id number] Patient Account Number: [id number] Date of Birth/Sex: 25-Nov-1961 (57 y.o. F) Treating RN: Arnette NorrisBiell, Kristina Primary Care Jsean Taussig: Baruch GoutyLada, Melinda Other Clinician: Referring Sherrie Marsan: Baruch GoutyLada,  Melinda Treating Sheela Mcculley/Extender: Altamese CarolinaOBSON, MICHAEL G Weeks in Treatment: 2 Vital Signs Time Taken: 13:43 Temperature (F): 98.1 Height (in): 65 Pulse (bpm): 67 Weight (lbs): 230 Respiratory Rate (breaths/min): 18 Body Mass Index (BMI): 38.3 Blood Pressure (mmHg): 159/78 Reference Range: 80 - 120 mg / dl Electronic Signature(s) Signed: 11/29/2018 1:37:43 PM By: Arnette NorrisBiell, Kristina Entered By: Arnette NorrisBiell, Kristina on 11/23/2018 13:44:20

## 2018-12-14 ENCOUNTER — Encounter: Payer: Managed Care, Other (non HMO) | Attending: Internal Medicine | Admitting: Internal Medicine

## 2018-12-14 DIAGNOSIS — E1151 Type 2 diabetes mellitus with diabetic peripheral angiopathy without gangrene: Secondary | ICD-10-CM | POA: Diagnosis not present

## 2018-12-14 DIAGNOSIS — E11622 Type 2 diabetes mellitus with other skin ulcer: Secondary | ICD-10-CM | POA: Diagnosis not present

## 2018-12-14 DIAGNOSIS — L02211 Cutaneous abscess of abdominal wall: Secondary | ICD-10-CM | POA: Insufficient documentation

## 2018-12-14 DIAGNOSIS — L02214 Cutaneous abscess of groin: Secondary | ICD-10-CM | POA: Insufficient documentation

## 2018-12-14 DIAGNOSIS — G20 Parkinson's disease: Secondary | ICD-10-CM | POA: Diagnosis not present

## 2018-12-14 DIAGNOSIS — I10 Essential (primary) hypertension: Secondary | ICD-10-CM | POA: Insufficient documentation

## 2018-12-14 DIAGNOSIS — S31109S Unspecified open wound of abdominal wall, unspecified quadrant without penetration into peritoneal cavity, sequela: Secondary | ICD-10-CM | POA: Diagnosis not present

## 2018-12-14 DIAGNOSIS — L98499 Non-pressure chronic ulcer of skin of other sites with unspecified severity: Secondary | ICD-10-CM | POA: Insufficient documentation

## 2018-12-14 DIAGNOSIS — F17210 Nicotine dependence, cigarettes, uncomplicated: Secondary | ICD-10-CM | POA: Diagnosis not present

## 2018-12-14 DIAGNOSIS — F319 Bipolar disorder, unspecified: Secondary | ICD-10-CM | POA: Diagnosis not present

## 2018-12-14 DIAGNOSIS — J449 Chronic obstructive pulmonary disease, unspecified: Secondary | ICD-10-CM | POA: Diagnosis not present

## 2018-12-15 NOTE — Progress Notes (Signed)
Debra [name], Debra A. ([id number]) Visit Report for 12/14/2018 Arrival Information Details Patient Name: Debra [name], Debra A. Date of Service: 12/14/2018 1:00 PM Medical Record Number: [id number] Patient Account Number: [id number] Date of Birth/Sex: [birth year] (58 y.o. F) Treating RN: Curtis Sitesorthy, Joanna Primary Care Marissah Vandemark: Baruch GoutyLada, Melinda Other Clinician: Referring Reilly Blades: Baruch GoutyLada, Melinda Treating Woodie Degraffenreid/Extender: Altamese CarolinaOBSON, MICHAEL G Weeks in Treatment: 5 Visit Information History Since Last Visit Added or deleted any medications: Yes Patient Arrived: Ambulatory Any new allergies or adverse reactions: No Arrival Time: 12:55 Had a fall or experienced change in No Accompanied By: friend activities of daily living that may affect Transfer Assistance: None risk of falls: Patient Identification Verified: Yes Signs or symptoms of abuse/neglect since last visito No Secondary Verification Process Yes Hospitalized since last visit: No Completed: Implantable device outside of the clinic excluding No Patient Has Alerts: Yes cellular tissue based products placed in the center Patient Alerts: Patient on Blood since last visit: Thinner Has Dressing in Place as Prescribed: Yes aspirin 81 Pain Present Now: No Type II DM Electronic Signature(s) Signed: 12/14/2018 4:30:10 PM By: Dayton MartesWallace, RCP,RRT,CHT, Sallie RCP, RRT, CHT Entered By: Dayton MartesWallace, RCP,RRT,CHT, Sallie on 12/14/2018 12:56:53 Debra [name], Debra A. ([id number]) -------------------------------------------------------------------------------- Clinic Level of Care Assessment Details Patient Name: Debra [name], Debra A. Date of Service: 12/14/2018 1:00 PM Medical Record Number: [id number] Patient Account Number: [id number] Date of Birth/Sex: [birth year] (58 y.o. F) Treating RN: Curtis Sitesorthy, Joanna Primary Care Debra [name]: Baruch GoutyLada, Melinda Other Clinician: Referring Nikkol Pai: Baruch GoutyLada, Melinda Treating Debra [name]/Extender: Altamese CarolinaOBSON, MICHAEL G Weeks in Treatment: 5 Clinic Level of Care Assessment  Items TOOL 4 Quantity Score []  - Use when only an EandM is performed on FOLLOW-UP visit 0 ASSESSMENTS - Nursing Assessment / Reassessment X - Reassessment of Co-morbidities (includes updates in patient status) 1 10 X- 1 5 Reassessment of Adherence to Treatment Plan ASSESSMENTS - Wound and Skin Assessment / Reassessment X - Simple Wound Assessment / Reassessment - one wound 1 5 []  - 0 Complex Wound Assessment / Reassessment - multiple wounds []  - 0 Dermatologic / Skin Assessment (not related to wound area) ASSESSMENTS - Focused Assessment []  - Circumferential Edema Measurements - multi extremities 0 []  - 0 Nutritional Assessment / Counseling / Intervention []  - 0 Lower Extremity Assessment (monofilament, tuning fork, pulses) []  - 0 Peripheral Arterial Disease Assessment (using hand held doppler) ASSESSMENTS - Ostomy and/or Continence Assessment and Care []  - Incontinence Assessment and Management 0 []  - 0 Ostomy Care Assessment and Management (repouching, etc.) PROCESS - Coordination of Care X - Simple Patient / Family Education for ongoing care 1 15 []  - 0 Complex (extensive) Patient / Family Education for ongoing care X- 1 10 Staff obtains ChiropractorConsents, Records, Test Results / Process Orders []  - 0 Staff telephones HHA, Nursing Homes / Clarify orders / etc []  - 0 Routine Transfer to another Facility (non-emergent condition) []  - 0 Routine Hospital Admission (non-emergent condition) []  - 0 New Admissions / Manufacturing engineernsurance Authorizations / Ordering NPWT, Apligraf, etc. []  - 0 Emergency Hospital Admission (emergent condition) X- 1 10 Simple Discharge Coordination Debra FudgeKANE, Debra A. ([id number]) []  - 0 Complex (extensive) Discharge Coordination PROCESS - Special Needs []  - Pediatric / Minor Patient Management 0 []  - 0 Isolation Patient Management []  - 0 Hearing / Language / Visual special needs []  - 0 Assessment of Community assistance (transportation, D/C planning, etc.) []  -  0 Additional assistance / Altered mentation []  - 0 Support Surface(s) Assessment (bed, cushion, seat, etc.) INTERVENTIONS - Wound Cleansing / Measurement X - Simple Wound  Cleansing - one wound 1 5 []  - 0 Complex Wound Cleansing - multiple wounds X- 1 5 Wound Imaging (photographs - any number of wounds) []  - 0 Wound Tracing (instead of photographs) X- 1 5 Simple Wound Measurement - one wound []  - 0 Complex Wound Measurement - multiple wounds INTERVENTIONS - Wound Dressings []  - Small Wound Dressing one or multiple wounds 0 []  - 0 Medium Wound Dressing one or multiple wounds []  - 0 Large Wound Dressing one or multiple wounds []  - 0 Application of Medications - topical []  - 0 Application of Medications - injection INTERVENTIONS - Miscellaneous []  - External ear exam 0 []  - 0 Specimen Collection (cultures, biopsies, blood, body fluids, etc.) []  - 0 Specimen(s) / Culture(s) sent or taken to Lab for analysis []  - 0 Patient Transfer (multiple staff / Nurse, adult / Similar devices) []  - 0 Simple Staple / Suture removal (25 or less) []  - 0 Complex Staple / Suture removal (26 or more) []  - 0 Hypo / Hyperglycemic Management (close monitor of Blood Glucose) []  - 0 Ankle / Brachial Index (ABI) - do not check if billed separately X- 1 5 Vital Signs [name], [name] ([id number]) Has the patient been seen at the hospital within the last three years: Yes Total Score: 75 Level Of Care: New/Established - Level 2 Electronic Signature(s) Signed: 12/14/2018 5:48:26 PM By: Curtis Sites Entered By: Curtis Sites on 12/14/2018 13:33:08 Debra [name] ([id number]) -------------------------------------------------------------------------------- Encounter Discharge Information Details Patient Name: Debra [name]. Date of Service: 12/14/2018 1:00 PM Medical Record Number: [id number] Patient Account Number: [id number] Date of Birth/Sex: March 16, 1961 (58 y.o. F) Treating RN: Curtis Sites Primary Care  Jaimey Franchini: Baruch Gouty Other Clinician: Referring Tyger Wichman: Baruch Gouty Treating Bradey Luzier/Extender: Altamese [place] in Treatment: 5 Encounter Discharge Information Items Discharge Condition: Stable Ambulatory Status: Ambulatory Discharge Destination: Home Transportation: Private Auto Accompanied By: self Schedule Follow-up Appointment: Yes Clinical Summary of Care: Electronic Signature(s) Signed: 12/14/2018 5:48:26 PM By: Curtis Sites Entered By: Curtis Sites on 12/14/2018 13:34:23 Debra [name] ([id number]) -------------------------------------------------------------------------------- Lower Extremity Assessment Details Patient Name: Debra [name] Date of Service: 12/14/2018 1:00 PM Medical Record Number: [id number] Patient Account Number: [id number] Date of Birth/Sex: [birth year] (58 y.o. F) Treating RN: Rema Jasmine Primary Care Rosana Farnell: Baruch Gouty Other Clinician: Referring Domingos Riggi: Baruch Gouty Treating Ikram Riebe/Extender: Maxwell Caul Weeks in Treatment: 5 Electronic Signature(s) Signed: 12/14/2018 4:13:45 PM By: Rema Jasmine Entered By: Rema Jasmine on 12/14/2018 13:10:33 Debra [name] ([id number]) -------------------------------------------------------------------------------- Multi Wound Chart Details Patient Name: Debra [name] Date of Service: 12/14/2018 1:00 PM Medical Record Number: [id number] Patient Account Number: [id number] Date of Birth/Sex: 1961-09-08 (58 y.o. F) Treating RN: Curtis Sites Primary Care Salle Brandle: Baruch Gouty Other Clinician: Referring Deyna Carbon: Baruch Gouty Treating Gail Creekmore/Extender: Altamese  in Treatment: 5 Vital Signs Height(in): 65 Pulse(bpm): 69 Weight(lbs): 230 Blood Pressure(mmHg): 135/68 Body Mass Index(BMI): 38 Temperature(F): 98.4 Respiratory Rate 16 (breaths/min): Photos: [N/A:N/A] Wound Location: Abdomen - Lower Quadrant - N/A N/A Distal Wounding Event: Gradually Appeared N/A N/A Primary  Etiology: Abscess N/A N/A Comorbid History: Hypertension, Type II N/A N/A Diabetes, Osteoarthritis Date Acquired: 11/10/2018 N/A N/A Weeks of Treatment: 3 N/A N/A Wound Status: Healed - Epithelialized N/A N/A Measurements L x W x D 0x0x0 N/A N/A (cm) Area (cm) : 0 N/A N/A Volume (cm) : 0 N/A N/A % Reduction in Area: 100.00% N/A N/A % Reduction in Volume: 100.00% N/A N/A Classification: Full Thickness Without N/A N/A Exposed Support Structures  Exudate Amount: Medium N/A N/A Exudate Type: Serosanguineous N/A N/A Exudate Color: red, brown N/A N/A Wound Margin: Flat and Intact N/A N/A Granulation Amount: Small (1-33%) N/A N/A Granulation Quality: Red N/A N/A Necrotic Amount: Large (67-100%) N/A N/A Exposed Structures: Fat Layer (Subcutaneous N/A N/A Tissue) Exposed: Yes Fascia: No Tendon: No Muscle: No Debra FudgeKANE, Ever A. ([id number]) Joint: No Bone: No Epithelialization: None N/A N/A Periwound Skin Texture: Excoriation: No N/A N/A Induration: No Callus: No Crepitus: No Rash: No Scarring: No Periwound Skin Moisture: Maceration: No N/A N/A Dry/Scaly: No Periwound Skin Color: Atrophie Blanche: No N/A N/A Cyanosis: No Ecchymosis: No Erythema: No Hemosiderin Staining: No Mottled: No Pallor: No Rubor: No Tenderness on Palpation: No N/A N/A Wound Preparation: Ulcer Cleansing: N/A N/A Rinsed/Irrigated with Saline Topical Anesthetic Applied: Other: lidocaine 4% Treatment Notes Electronic Signature(s) Signed: 12/14/2018 5:09:58 PM By: Baltazar Najjarobson, Michael MD Entered By: Baltazar Najjarobson, Michael on 12/14/2018 15:04:59 Debra [name], Debra A. ([id number]) -------------------------------------------------------------------------------- Multi-Disciplinary Care Plan Details Patient Name: Debra [name], Karess A. Date of Service: 12/14/2018 1:00 PM Medical Record Number: [id number] Patient Account Number: [id number] Date of Birth/Sex: [birth year] (58 y.o. F) Treating RN: Curtis Sitesorthy, Joanna Primary Care Leoncio Hansen: Baruch GoutyLada,  Melinda Other Clinician: Referring Raju Coppolino: Baruch GoutyLada, Melinda Treating Jolan Upchurch/Extender: Altamese CarolinaOBSON, MICHAEL G Weeks in Treatment: 5 Active Inactive Electronic Signature(s) Signed: 12/14/2018 5:48:26 PM By: Curtis Sitesorthy, Joanna Entered By: Curtis Sitesorthy, Joanna on 12/14/2018 13:32:31 Debra [name], Debra A. ([id number]) -------------------------------------------------------------------------------- Pain Assessment Details Patient Name: Debra [name], Debra A. Date of Service: 12/14/2018 1:00 PM Medical Record Number: [id number] Patient Account Number: [id number] Date of Birth/Sex: [birth year] (58 y.o. F) Treating RN: Curtis Sitesorthy, Joanna Primary Care Jaryn Rosko: Baruch GoutyLada, Melinda Other Clinician: Referring Nasiah Polinsky: Baruch GoutyLada, Melinda Treating Patrik Turnbaugh/Extender: Altamese CarolinaOBSON, MICHAEL G Weeks in Treatment: 5 Active Problems Location of Pain Severity and Description of Pain Patient Has Paino No Site Locations Pain Management and Medication Current Pain Management: Electronic Signature(s) Signed: 12/14/2018 4:30:10 PM By: Sallee ProvencalWallace, RCP,RRT,CHT, Sallie RCP, RRT, CHT Signed: 12/14/2018 5:48:26 PM By: Curtis Sitesorthy, Joanna Entered By: Dayton MartesWallace, RCP,RRT,CHT, Sallie on 12/14/2018 12:57:03 Debra [name], Kiesha A. ([id number]) -------------------------------------------------------------------------------- Patient/Caregiver Education Details Patient Name: Debra [name], Debra A. Date of Service: 12/14/2018 1:00 PM Medical Record Number: [id number] Patient Account Number: [id number] Date of Birth/Gender: [birth year] (58 y.o. F) Treating RN: Curtis Sitesorthy, Joanna Primary Care Physician: Baruch GoutyLada, Melinda Other Clinician: Referring Physician: Baruch GoutyLada, Melinda Treating Physician/Extender: Altamese CarolinaOBSON, MICHAEL G Weeks in Treatment: 5 Education Assessment Education Provided To: Patient Education Topics Provided Basic Hygiene: Handouts: Other: care of newly healed ulcer site Methods: Explain/Verbal Responses: State content correctly Electronic Signature(s) Signed: 12/14/2018 5:48:26 PM By: Curtis Sitesorthy,  Joanna Entered By: Curtis Sitesorthy, Joanna on 12/14/2018 13:33:29 Debra [name], Debra A. ([id number]) -------------------------------------------------------------------------------- Wound Assessment Details Patient Name: Debra [name], Debra A. Date of Service: 12/14/2018 1:00 PM Medical Record Number: [id number] Patient Account Number: [id number] Date of Birth/Sex: [birth year] (58 y.o. F) Treating RN: Rema JasmineNg, Wendi Primary Care Navie Lamoreaux: Baruch GoutyLada, Melinda Other Clinician: Referring Chadwin Fury: Baruch GoutyLada, Melinda Treating Joya Willmott/Extender: Altamese CarolinaOBSON, MICHAEL G Weeks in Treatment: 5 Wound Status Wound Number: 3 Primary Etiology: Abscess Wound Location: Abdomen - Lower Quadrant - Distal Wound Status: Healed - Epithelialized Wounding Event: Gradually Appeared Comorbid Hypertension, Type II Diabetes, History: Osteoarthritis Date Acquired: 11/10/2018 Weeks Of Treatment: 3 Clustered Wound: No Photos Photo Uploaded By: Rema JasmineNg, Wendi on 12/14/2018 14:40:09 Wound Measurements Length: (cm) 0 % Red Width: (cm) 0 % Red Depth: (cm) 0 Epith Area: (cm) 0 Volume: (cm) 0 uction in Area: 100% uction in Volume: 100% elialization: None Wound Description Full Thickness Without Exposed Support Classification: Structures Wound Margin: Flat and Intact Exudate  Medium Amount: Exudate Type: Serosanguineous Exudate Color: red, brown Foul Odor After Cleansing: No Slough/Fibrino Yes Wound Bed Granulation Amount: Small (1-33%) Exposed Structure Granulation Quality: Red Fascia Exposed: No Necrotic Amount: Large (67-100%) Fat Layer (Subcutaneous Tissue) Exposed: Yes Necrotic Quality: Adherent Slough Tendon Exposed: No Muscle Exposed: No Joint Exposed: No Bone Exposed: No Debra [name], Debra A. ([id number]) Periwound Skin Texture Texture Color No Abnormalities Noted: No No Abnormalities Noted: No Callus: No Atrophie Blanche: No Crepitus: No Cyanosis: No Excoriation: No Ecchymosis: No Induration: No Erythema: No Rash: No Hemosiderin  Staining: No Scarring: No Mottled: No Pallor: No Moisture Rubor: No No Abnormalities Noted: No Dry / Scaly: No Maceration: No Wound Preparation Ulcer Cleansing: Rinsed/Irrigated with Saline Topical Anesthetic Applied: Other: lidocaine 4%, Electronic Signature(s) Signed: 12/14/2018 4:13:45 PM By: Rema Jasmine Entered By: Rema Jasmine on 12/14/2018 13:13:22 Debra [name] ([id number]) -------------------------------------------------------------------------------- Vitals Details Patient Name: Debra [name] Date of Service: 12/14/2018 1:00 PM Medical Record Number: [id number] Patient Account Number: [id number] Date of Birth/Sex: [birth year] (58 y.o. F) Treating RN: Curtis Sites Primary Care Konner Warrior: Baruch Gouty Other Clinician: Referring Shadoe Cryan: Baruch Gouty Treating Brainard Highfill/Extender: Altamese [place] in Treatment: 5 Vital Signs Time Taken: 12:57 Temperature (F): 98.4 Height (in): 65 Pulse (bpm): 69 Weight (lbs): 230 Respiratory Rate (breaths/min): 16 Body Mass Index (BMI): 38.3 Blood Pressure (mmHg): 135/68 Reference Range: 80 - 120 mg / dl Electronic Signature(s) Signed: 12/14/2018 4:30:10 PM By: Dayton Martes RCP, RRT, CHT Entered By: Dayton Martes on 12/14/2018 13:01:12

## 2018-12-15 NOTE — Progress Notes (Signed)
Debra [name], Debra A. ([id number]) Visit Report for 12/14/2018 HPI Details Patient Name: Debra [name], Debra A. Date of Service: 12/14/2018 1:00 PM Medical Record Number: [id number] Patient Account Number: [id number] Date of Birth/Sex: 14-Jan-1961 (58 y.o. 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 She tells me everything was fine up until a week ago. She noticed pain in her left groin area. By Saturday this it started to drain. She applied black salve to this area which she has done in the past and is helped heal problems in this area. This did not work. She was seen in the ER on 03/27/18 with an abscess in the left groin. Noted to have surrounding cellulitis and an indurated area. She underwent an IandD. She was started on patient clindamycin. Her white count was 11.9. Comprehensive metabolic panel normal lactic acid level normal at 1.2. They did not do a culture that I can see. She was seen yesterday at her primary physician's office she may have been put on cephalexin at that point. She is using iodoform packing The patient is a type II diabetic with a history of PAD last hemoglobin A1c of 7.3. She has a history of parkinsonism, COPD, bipolar disorder hypertension. She is a continued 1 pack per day smoker She tells me that she does have a history of recurrent abscesses in this area last about a year ago. Mostly she seems to care for these herself and they seem to go away. 04/06/18; patient's surgical IandD on the lower left abdominal quadrant just above the symphysis pubis appears better. Following is better we've been using silver alginate. Culture I did last week did not grow MRSA rather Enterococcus faecalis.  I substituted Augmentin for the clindamycin however the patient didn't have enough money, however she will pick it up this morning 04/13/18; surgical IandD on the lower left abdominal quadrant just above the symphysis pubis. Surface of this area looks healthy. This is an oval-shaped wound lying horizontally. Medially it has A, all with about 0.9 cm in depth there is no drainage no surrounding erythema. She is completing the Augmentin I gave her last week 04/27/18; surgical IandD site on the lower left abdominal quadrant just above the symphysis pubis. Surface of this wound is improved dimensions are better. We've been using silver alginate and border foam. She tells me she developed C. difficile has been treated with oral vancomycin. Will need to be very judicious with any further antibiotics 05/04/18; surgical IandD site on the lower left abdomen just above the symphysis pubis. All of this is closed over except a small slit like area which is probably where the sinus tract was. There is no drainage no tenderness. 05/11/18; surgical IandD site on the left lower abdomen just above the symphysis pubis. All of this is closed. There is no open area here. No drainage and no tenderness she does have a history of abscesses but apparently none that have had difficulty healing like this one READMISSION 11/09/18 This is a patient we know from previous stays in this clinic. She apparently has a history of recurrent Cutaneous abscesses. We had her in the clinic last time with a lower abdomen/inguinal area abscess that required a surgical IandD. We managed to get this to close over. She also  lives in fear of recurrent C. difficile and does not want systemic antibiotics. She tells [place] that she developed a small abscess in the wound area about 2-3 weeks ago. This spontaneously ruptured and had purulent material maintained. She is here for our review of the resultant wound 11/23/2018; interestingly the area of the  opened for which we saw her 2 weeks ago has closed. This was likely an abscess given the clinical description although we did not really see it at the beginning. Over the timeframe since we have seen here she developed a second area just underneath the original one from last week. As her friend describes this it is a purple papule that is painful, develops purulence ruptures and then heals. Right now it is a flat open area. Debra [name] ([id number]) 12/14/2018; the patient has no open wound. By description these are said to be abscess these perform rupture and then formal wound. This is a second time we have had her for this. Electronic Signature(s) Signed: 12/14/2018 5:09:58 PM By: Baltazar Najjar MD Entered By: Baltazar Najjar on 12/14/2018 15:06:33 Debra [name] ([id number]) -------------------------------------------------------------------------------- Physical Exam Details Patient Name: Debra [name] Date of Service: 12/14/2018 1:00 PM Medical Record Number: [id number] Patient Account Number: [id number] Date of Birth/Sex: August 04, 1961 (58 y.o. F) Treating RN: Curtis Sites Primary Care Provider: Baruch Gouty Other Clinician: Referring Provider: Baruch Gouty Treating Provider/Extender: Altamese [place] in Treatment: 5 Constitutional Sitting or standing Blood Pressure is within target range for patient.. Pulse regular and within target range for patient.[name] Respirations regular, non-labored and within target range.. Temperature is normal and within the target range for the patient.[name] appears in no distress. Gastrointestinal (GI) Abdomen is obese no masses no tenderness. No liver or spleen enlargement or tenderness.. Notes Wound exam; right lower mid abdominal just above the symphysis pubis. There is no open area here. Everything is fully epithelialized. Electronic Signature(s) Signed: 12/14/2018 5:09:58 PM By: Baltazar Najjar MD Entered By: Baltazar Najjar on 12/14/2018 15:21:29 Debra [name] ([id number]) -------------------------------------------------------------------------------- Physician Orders Details Patient Name: Debra [name] Date of Service: 12/14/2018 1:00 PM Medical Record Number: [id number] Patient Account Number: [id number] Date of Birth/Sex: Jan 15, 1961 (57 y.o. F) Treating RN: Curtis Sites Primary Care Provider: Baruch Gouty Other Clinician: Referring Provider: Baruch Gouty Treating Provider/Extender: Altamese [place] in Treatment: 5 Verbal / Phone Orders: No Diagnosis Coding Discharge From [facility] Services o Discharge from Wound Care Center Electronic Signature(s) Signed: 12/14/2018 5:09:58 PM By: Baltazar Najjar MD Signed: 12/14/2018 5:48:26 PM By: Curtis Sites Entered By: Curtis Sites on 12/14/2018 13:32:45 Debra [name] ([id number]) -------------------------------------------------------------------------------- Problem List Details Patient Name: Debra [name] Date of Service: 12/14/2018 1:00 PM Medical Record Number: [id number] Patient Account Number: [id number] Date of Birth/Sex: January 07, 1961 (58 y.o. F) Treating RN: Curtis Sites Primary Care Provider: Baruch Gouty Other Clinician: Referring Provider: Baruch Gouty Treating Provider/Extender: Altamese [place] in Treatment: 5 Active Problems ICD-10 Evaluated Encounter Code Description Active Date Today Diagnosis S31.109S Unspecified open wound of abdominal wall, unspecified 11/09/2018 No Yes quadrant without penetration into peritoneal cavity, sequela L02.211 Cutaneous abscess of abdominal wall 11/09/2018 No Yes E11.622 Type 2 diabetes mellitus with other skin ulcer 11/09/2018 No Yes Inactive Problems Resolved Problems Electronic Signature(s) Signed: 12/14/2018 5:09:58 PM By: Baltazar Najjar MD Entered By: Baltazar Najjar on 12/14/2018 15:04:49 Debra [name] ([id number]) -------------------------------------------------------------------------------- Progress Note  Details Patient Name: Debra [name] Date of Service: 12/14/2018 1:00 PM Medical Record Number: [id number] Patient Account Number: [id number] Date  of Birth/Sex: 1961/01/22 (57 y.o. 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 She tells me everything was fine up until a week ago. She noticed pain in her left groin area. By Saturday this it started to drain. She applied black salve to this area which she has done in the past and is helped heal problems in this area. This did not work. She was seen in the ER on 03/27/18 with an abscess in the left groin. Noted to have surrounding cellulitis and an indurated area. She underwent an IandD. She was started on patient clindamycin. Her white count was 11.9. Comprehensive metabolic panel normal lactic acid level normal at 1.2. They did not do a culture that I can see. She was seen yesterday at her primary physician's office she may have been put on cephalexin at that point. She is using iodoform packing The patient is a type II diabetic with a history of PAD last hemoglobin A1c of 7.3. She has a history of parkinsonism, COPD, bipolar disorder hypertension. She is a continued 1 pack per day smoker She tells me that she does have a history of recurrent abscesses in this area last about a year ago. Mostly she seems to care for these herself and they seem to go away. 04/06/18; patient's surgical IandD on the lower left abdominal quadrant just above the symphysis pubis appears better. Following is better we've been using silver alginate. Culture I did last week did not grow MRSA rather Enterococcus faecalis. I substituted Augmentin for the clindamycin however the patient  didn't have enough money, however she will pick it up this morning 04/13/18; surgical IandD on the lower left abdominal quadrant just above the symphysis pubis. Surface of this area looks healthy. This is an oval-shaped wound lying horizontally. Medially it has A, all with about 0.9 cm in depth there is no drainage no surrounding erythema. She is completing the Augmentin I gave her last week 04/27/18; surgical IandD site on the lower left abdominal quadrant just above the symphysis pubis. Surface of this wound is improved dimensions are better. We've been using silver alginate and border foam. She tells me she developed C. difficile has been treated with oral vancomycin. Will need to be very judicious with any further antibiotics 05/04/18; surgical IandD site on the lower left abdomen just above the symphysis pubis. All of this is closed over except a small slit like area which is probably where the sinus tract was. There is no drainage no tenderness. 05/11/18; surgical IandD site on the left lower abdomen just above the symphysis pubis. All of this is closed. There is no open area here. No drainage and no tenderness she does have a history of abscesses but apparently none that have had difficulty healing like this one READMISSION 11/09/18 This is a patient we know from previous stays in this clinic. She apparently has a history of recurrent Cutaneous abscesses. We had her in the clinic last time with a lower abdomen/inguinal area abscess that required a surgical IandD. We managed to get this to close over. She also lives in fear of recurrent C. difficile and does not want systemic antibiotics. She tells [place] that she developed a small abscess in the wound area about 2-3 weeks ago.  This spontaneously ruptured and had purulent material maintained. She is here for our review of the resultant wound 11/23/2018; interestingly the area of the opened for which we saw her 2 weeks ago has closed. This was likely  an abscess given the clinical description although we did not really see it at the beginning. Over the timeframe since we have seen here she developed a second area just underneath the original one from last week. As her friend describes this it is a purple papule that is painful, develops purulence ruptures and then heals. Right now it is a flat open area. 12/14/2018; the patient has no open wound. By description these are said to be abscess these perform rupture and then formal wound. This is a second time we have had her for this. Debra [name], Debra A. ([id number]) Objective Constitutional Sitting or standing Blood Pressure is within target range for patient.. Pulse regular and within target range for patient.[name]. Respirations regular, non-labored and within target range.. Temperature is normal and within the target range for the patient.[name]. appears in no distress. Vitals Time Taken: 12:57 PM, Height: 65 in, Weight: 230 lbs, BMI: 38.3, Temperature: 98.4 F, Pulse: 69 bpm, Respiratory Rate: 16 breaths/min, Blood Pressure: 135/68 mmHg. Gastrointestinal (GI) Abdomen is obese no masses no tenderness. No liver or spleen enlargement or tenderness.. General Notes: Wound exam; right lower mid abdominal just above the symphysis pubis. There is no open area here. Everything is fully epithelialized. Integumentary (Hair, Skin) Wound #3 status is Healed - Epithelialized. Original cause of wound was Gradually Appeared. The wound is located on the Distal Abdomen - Lower Quadrant. The wound measures 0cm length x 0cm width x 0cm depth; 0cm^2 area and 0cm^3 volume. There is Fat Layer (Subcutaneous Tissue) Exposed exposed. There is a medium amount of serosanguineous drainage noted. The wound margin is flat and intact. There is small (1-33%) red granulation within the wound bed. There is a large (67-100%) amount of necrotic tissue within the wound bed including Adherent Slough. The periwound skin appearance did not exhibit:  Callus, Crepitus, Excoriation, Induration, Rash, Scarring, Dry/Scaly, Maceration, Atrophie Blanche, Cyanosis, Ecchymosis, Hemosiderin Staining, Mottled, Pallor, Rubor, Erythema. Assessment Active Problems ICD-10 Unspecified open wound of abdominal wall, unspecified quadrant without penetration into peritoneal cavity, sequela Cutaneous abscess of abdominal wall Type 2 diabetes mellitus with other skin ulcer Plan [facility] [place] Debra A. ([id number]) 1. The patient can be discharged from the wound care center 2. I have asked her of any more of these lesions form to take pictures on her smart phone so we can be a little more sure what we are actually looking at. These do sound like abscesses however Electronic Signature(s) Signed: 12/14/2018 5:09:58 PM By: Baltazar Najjarobson, Pati Thinnes MD Entered By: Baltazar Najjarobson, Nadia Torr on 12/14/2018 15:22:14 Debra [name], Debra A. ([id number]) -------------------------------------------------------------------------------- SuperBill Details Patient Name: Debra [name], Debra A. Date of Service: 12/14/2018 Medical Record Number: [id number] Patient Account Number: [id number] Date of Birth/Sex: May 07, 1961 (58 y.o. F) Treating RN: Curtis Sitesorthy, Joanna Primary Care Provider: Baruch GoutyLada, Melinda Other Clinician: Referring Provider: Baruch GoutyLada, Melinda Treating Provider/Extender: Altamese CarolinaOBSON, Mildreth Reek G Weeks in Treatment: 5 Diagnosis Coding ICD-10 Codes Code Description Unspecified open wound of abdominal wall, unspecified quadrant without penetration into peritoneal S31.109S cavity, sequela L02.211 Cutaneous abscess of abdominal wall E11.622 Type 2 diabetes mellitus with other skin ulcer Facility Procedures CPT4 Code: [id number] Description: [id number] - WOUND CARE VISIT-LEV 2 EST PT Modifier: Quantity: 1 Physician Procedures CPT4: Description Modifier Quantity Code [id number] - WC  PHYS LEVEL 2 - EST PT 1 ICD-10 Diagnosis Description S31.109S Unspecified open wound of  abdominal wall, unspecified quadrant without penetration into peritoneal cavity, sequela L02.211 Cutaneous  abscess of abdominal wall Electronic Signature(s) Signed: 12/14/2018 5:09:58 PM By: Baltazar Najjar MD Entered By: Baltazar Najjar on 12/14/2018 15:22:51

## 2018-12-26 ENCOUNTER — Other Ambulatory Visit: Payer: Self-pay | Admitting: Family Medicine

## 2018-12-27 NOTE — Telephone Encounter (Signed)
Lab Results  Component Value Date   K 4.2 08/19/2018   Lab Results  Component Value Date   CREATININE 0.82 08/19/2018   Approved

## 2018-12-28 ENCOUNTER — Encounter: Payer: Self-pay | Admitting: Family Medicine

## 2018-12-29 MED ORDER — DAPAGLIFLOZIN PROPANEDIOL 10 MG PO TABS: 10.0000 mg | ORAL_TABLET | Freq: Every day | ORAL | 5 refills | Status: DC

## 2018-12-29 MED ORDER — FLUCONAZOLE 150 MG PO TABS: 150.0000 mg | ORAL_TABLET | Freq: Every day | ORAL | 0 refills | Status: DC

## 2019-01-19 ENCOUNTER — Ambulatory Visit: Payer: Managed Care, Other (non HMO) | Attending: Neurology

## 2019-01-19 DIAGNOSIS — G4733 Obstructive sleep apnea (adult) (pediatric): Secondary | ICD-10-CM | POA: Diagnosis not present

## 2019-01-23 ENCOUNTER — Other Ambulatory Visit: Payer: Self-pay | Admitting: Family Medicine

## 2019-01-23 DIAGNOSIS — E119 Type 2 diabetes mellitus without complications: Secondary | ICD-10-CM

## 2019-01-23 DIAGNOSIS — Z794 Long term (current) use of insulin: Principal | ICD-10-CM

## 2019-01-23 DIAGNOSIS — E0865 Diabetes mellitus due to underlying condition with hyperglycemia: Secondary | ICD-10-CM

## 2019-01-23 NOTE — Telephone Encounter (Signed)
Copied from CRM [id number]. Topic: Quick Communication - Rx Refill/Question >> Jan 23, 2019  2:19 PM [place], [place] D wrote: Medication: Insulin Pen Needle 31G X 6 MM MISC Pt stated that her insurance company has changed the preferred brand but she doesn't know what that is. Wants to make sure preferred brand is used because she was charged $30 for her last refill./ glucose blood (ONE TOUCH ULTRA TEST) test strip Pt stated to make sure this refill is for a 90 day supply  / dapagliflozin propanediol (FARXIGA) 10 MG TABS tablet Pt stated to make sure this refill is for a 90 day supply.  Has the patient contacted their pharmacy? Yes.   (Agent: If no, request that the patient contact the pharmacy for the refill.) (Agent: If yes, when and what did the pharmacy advise?)  Preferred Pharmacy (with phone number or street name): [facility] - [place], [place] - [address] [phone] (Phone) [phone] (Fax)  Agent: Please be advised that RX refills may take up to 3 business days. We ask that you follow-up with your pharmacy.

## 2019-01-25 MED ORDER — DAPAGLIFLOZIN PROPANEDIOL 10 MG PO TABS: 10.0000 mg | ORAL_TABLET | Freq: Every day | ORAL | 1 refills | Status: DC

## 2019-01-25 MED ORDER — GLUCOSE BLOOD VI STRP: ORAL_STRIP | 0 refills | Status: DC

## 2019-01-25 NOTE — Telephone Encounter (Signed)
Prescriptions sent exactly as teed up

## 2019-02-01 ENCOUNTER — Telehealth: Payer: Self-pay | Admitting: Family Medicine

## 2019-02-01 NOTE — Telephone Encounter (Signed)
I left a message asking the patient to call and schedule Medicare AWV-S with Nurse Health Advisor, [place].  If the patient calls back, please schedule Medicare Wellness Visit with Nurse Health Advisor. Last AWV 09/29/17 VDM (DD)

## 2019-02-04 ENCOUNTER — Other Ambulatory Visit: Payer: Self-pay | Admitting: Family Medicine

## 2019-02-05 NOTE — Telephone Encounter (Signed)
I received a request for HCTZ However, this was stopped when the patient left the hospital in June 2019 Please contact her and discuss, review discharge summary from June 2019 with her, where it says "STOP taking these medications" and HCTZ is one of them Has she been taking this all along? Did the pharmacy just do an automatic refill? Want to clarify

## 2019-02-06 NOTE — Telephone Encounter (Signed)
Pt states she never stopped, and has been taking qd?

## 2019-02-07 NOTE — Telephone Encounter (Signed)
Thank you Last Cr and K+ reviewed from Sept Rx sent

## 2019-02-08 NOTE — Progress Notes (Incomplete)
Cardiology Office Note  Date:  02/08/2019   ID:  Debra [name], DOB: 1961/01/03, MRN: [id number]  PCP:  Debra Courser, MD   No chief complaint on file.   HPI:  Debra [name] is a 58 y.o. female with a history of: Type II diabetes with decreased circulation ([place]) Abdominal aortic atherosclerosis ([place]) Uncontrolled diabetes mellitus type II with atherosclerosis of arteries of extremities (HCC) Hyperlipidemia Hypertension Phlebectasia Obstructive sleep apnea Parkinsonian tremor (HCC) Myalgia Osteopenia Adverse reaction to statin medication Acute low back pain with radicular symptoms, duration less than 6 weeks Lumbar disc disease with radiculopathy Lumbar herniated disc Left-sided low back pain with sciatica Bipolar disorder (HCC) Insomnia, persistent Leg swelling Current every day smoker, 0.5 ppd, 12 pack-years Who presents to the office for chest heaviness and uncontrolled diabetes mellitus type II with atherosclerosis of arteries of extremities ([place])  INTERVAL HISTORY: The patient reports today for an initial visit. She was referred by Dr. Arnetha Courser MD for chest heaviness and uncontrolled diabetes mellitus type II with atherosclerosis of arteries of extremities.  ***  Today's Blood pressure ***/*** Total Chol 218/ LDL 147 HBA1C 8.1  CR 0.82 Glucose 208   EKG personally reviewed by myself on todays visit Shows *** rhythm. *** bpm. ***   OTHER PAST MEDICAL HISTORY REVIEWED BY ME FOR TODAY'S VISIT: ***  PMH:   has a past medical history of Anxiety, Arthritis, C. difficile colitis, C. difficile colitis, Depression, Diabetes mellitus without complication ([place]), Family history of adverse reaction to anesthesia, Head injury, closed, with brief LOC ([place]) (2011ish), Hyperlipidemia, Hypertension, Insomnia, Myalgia (03/29/2018), Obesity, Parkinson's disease ([place]), Seasonal allergies, and Tobacco abuse.  PSH:    Past Surgical History:  Procedure Laterality Date   ABDOMINAL  HYSTERECTOMY  2012   BREAST BIOPSY Right    CESAREAN SECTION  1992   CHOLECYSTECTOMY  2013   LUMBAR LAMINECTOMY/DECOMPRESSION MICRODISCECTOMY Left 09/10/2015   Procedure: Left L4-5 Foraminotomy/Left L5-S1 Diskectomy;  Surgeon: Leeroy Cha, MD;  Location: [facility];  Service: Neurosurgery;  Laterality: Left;  Left L4-5 Foraminotomy/Left L5-S1 Diskectomy    Current Outpatient Medications  Medication Sig Dispense Refill   aspirin EC 81 MG tablet Take 81 mg by mouth daily.     blood glucose meter kit and supplies Use up to four times daily as directed, Dx E11.65, LON 99 months 1 each 0   calcium citrate-vitamin D (CITRACAL+D) 315-200 MG-UNIT tablet Take 1 tablet by mouth 2 (two) times daily.     Cholecalciferol 5000 units capsule Take 5,000 Units by mouth.      Colesevelam HCl (WELCHOL) 3.75 g PACK Take 0.5 packets by mouth daily. Mixed in liquid 30 packet 11   dapagliflozin propanediol (FARXIGA) 10 MG TABS tablet Take 10 mg by mouth daily. 90 tablet 1   DIFICID 200 MG TABS tablet Take 200 mg by mouth 2 (two) times daily.      fluconazole (DIFLUCAN) 150 MG tablet Take 1 tablet (150 mg total) by mouth daily. Do not take cholesterol medicine (statin) for 3 days 1 tablet 0   folic acid (FOLVITE) 263 MCG tablet Take 800 mcg by mouth daily.      glucose blood (ONE TOUCH ULTRA TEST) test strip USE UP TO 4 TIMES A DAY AS DIRECTED DX: E11.65 300 each 0   HUMALOG KWIKPEN 100 UNIT/ML KiwkPen Inject into the skin. Give subcutaneously with meals, 20 units in the AM and 20 units with evening meal     hydrochlorothiazide (HYDRODIURIL) 25  MG tablet TAKE 1 TABLET DAILY (DOSE INCREASE, REPLACES ALL OTHER HCTZ PRESCRIPTIONS) 90 tablet 1   Insulin Pen Needle 31G X 6 MM MISC For use with insulin injections, up to four injections daily 300 each 3   lisinopril (PRINIVIL,ZESTRIL) 30 MG tablet TAKE 1 TABLET BY MOUTH EVERY DAY 30 tablet 5   Magnesium 400 MG CAPS Take 250 mg by mouth daily.       Melatonin 10 MG TABS Take 1 tablet by mouth at bedtime.     Multiple Vitamins-Minerals (CENTRUM WOMEN) TABS Take 1 tablet by mouth.     ONETOUCH DELICA LANCETS 20U MISC Check fingerstick blood sugars four times a day; E11.65, LON 99 months 300 each 3   PARoxetine (PAXIL) 40 MG tablet Take 1 tablet (40 mg total) by mouth every morning. 90 tablet 1   pioglitazone (ACTOS) 30 MG tablet Take 1 tablet (30 mg total) by mouth daily. 90 tablet 3   propranolol ER (INDERAL LA) 80 MG 24 hr capsule TAKE 1 CAPSULE (80 MG TOTAL) BY MOUTH ONCE DAILY  3   TRESIBA FLEXTOUCH 200 UNIT/ML SOPN Inject 26 Units into the skin at bedtime. (Patient taking differently: Inject 110 Units into the skin at bedtime. )     No current facility-administered medications for this visit.      ALLERGIES:   Clindamycin/lincomycin; Metformin and related; Benadryl [diphenhydramine hcl]; Canagliflozin; Depakote er [divalproex sodium er]; Dilaudid [hydromorphone hcl]; Diphenhydramine-zinc acetate; Haloperidol; Hydromorphone; Neosporin [neomycin-bacitracin zn-polymyx]; Singulair [montelukast sodium]; Sitagliptin; Statins; Sulfa antibiotics; and Victoza [liraglutide]   SOCIAL HISTORY:  The patient  reports that she has been smoking cigarettes. She has a 12.00 pack-year smoking history. She has never used smokeless tobacco. She reports current alcohol use. She reports that she does not use drugs.   FAMILY HISTORY:   family history includes Bipolar disorder in her brother, brother, and father; COPD in her mother; Diabetes Mellitus II in her brother and father.    REVIEW OF SYSTEMS: Review of Systems  Constitutional: Negative.   Eyes: Negative.   Respiratory: Negative.   Cardiovascular: Negative.   Gastrointestinal: Negative.   Genitourinary: Negative.   Musculoskeletal: Negative.   Neurological: Negative.   Psychiatric/Behavioral: Negative.   All other systems reviewed and are negative.    PHYSICAL EXAM: VS:  There were no  vitals taken for this visit. , BMI There is no height or weight on file to calculate BMI.  GEN: Well nourished, well developed, in no acute distress HEENT: normal Neck: no JVD, carotid bruits, or masses Cardiac: RRR; no murmurs, rubs, or gallops,no edema *** Respiratory:  clear to auscultation bilaterally, normal work of breathing GI: soft, nontender, nondistended, + BS MS: no deformity or atrophy Skin: warm and dry, no rash Neuro:  Strength and sensation are intact Psych: euthymic mood, full affect    RECENT LABS: 08/05/2018: Hemoglobin 14.3; Platelets 266 08/19/2018: ALT 16; BUN 17; Creat 0.82; Magnesium 1.8; Potassium 4.2; Sodium 139    LIPID PANEL: Lab Results  Component Value Date   CHOL 218 (H) 11/18/2018   HDL 45 (L) 11/18/2018   LDLCALC 147 (H) 11/18/2018   TRIG 139 11/18/2018      WEIGHT: Wt Readings from Last 3 Encounters:  11/18/18 273 lb 1.6 oz (123.9 kg)  09/15/18 264 lb 12.8 oz (120.1 kg)  08/19/18 261 lb 8 oz (118.6 kg)       ASSESSMENT AND PLAN:  No diagnosis found. Plan:    Disposition:   F/U  *** months  Total encounter time more than *** minutes. Greater than 50% was spent in counseling and coordination of care with the patient.   No orders of the defined types were placed in this encounter.   I, Jesus Reyes am acting as a Education administrator for Ida Rogue, M.D., Ph.D.  {Add scribe attestation statement}  Signed, Esmond Plants, M.D., Ph.D. 02/08/2019  [place], [place]

## 2019-02-10 ENCOUNTER — Ambulatory Visit: Payer: Managed Care, Other (non HMO) | Admitting: Cardiovascular Disease

## 2019-02-15 ENCOUNTER — Other Ambulatory Visit: Payer: Self-pay | Admitting: Family Medicine

## 2019-02-16 ENCOUNTER — Other Ambulatory Visit: Payer: Self-pay

## 2019-02-16 ENCOUNTER — Ambulatory Visit (INDEPENDENT_AMBULATORY_CARE_PROVIDER_SITE_OTHER): Payer: 59 | Admitting: Psychiatry

## 2019-02-16 ENCOUNTER — Encounter: Payer: Self-pay | Admitting: Psychiatry

## 2019-02-16 ENCOUNTER — Other Ambulatory Visit
Admission: RE | Admit: 2019-02-16 | Discharge: 2019-02-16 | Disposition: A | Discharge status: Home / Self Care | Payer: Managed Care, Other (non HMO) | Source: Ambulatory Visit | Attending: Psychiatry | Admitting: Psychiatry

## 2019-02-16 VITALS — BP 131/76 | HR 84 | Temp 99.0°F | Wt 273.8 lb

## 2019-02-16 DIAGNOSIS — Z79899 Other long term (current) drug therapy: Secondary | ICD-10-CM | POA: Insufficient documentation

## 2019-02-16 DIAGNOSIS — F4310 Post-traumatic stress disorder, unspecified: Secondary | ICD-10-CM | POA: Insufficient documentation

## 2019-02-16 DIAGNOSIS — E119 Type 2 diabetes mellitus without complications: Secondary | ICD-10-CM | POA: Diagnosis not present

## 2019-02-16 DIAGNOSIS — Z886 Allergy status to analgesic agent status: Secondary | ICD-10-CM | POA: Diagnosis not present

## 2019-02-16 DIAGNOSIS — Z888 Allergy status to other drugs, medicaments and biological substances status: Secondary | ICD-10-CM | POA: Diagnosis not present

## 2019-02-16 DIAGNOSIS — Z818 Family history of other mental and behavioral disorders: Secondary | ICD-10-CM | POA: Insufficient documentation

## 2019-02-16 DIAGNOSIS — F17210 Nicotine dependence, cigarettes, uncomplicated: Secondary | ICD-10-CM | POA: Insufficient documentation

## 2019-02-16 DIAGNOSIS — F17200 Nicotine dependence, unspecified, uncomplicated: Secondary | ICD-10-CM

## 2019-02-16 DIAGNOSIS — Z881 Allergy status to other antibiotic agents status: Secondary | ICD-10-CM | POA: Insufficient documentation

## 2019-02-16 DIAGNOSIS — Z7982 Long term (current) use of aspirin: Secondary | ICD-10-CM | POA: Insufficient documentation

## 2019-02-16 DIAGNOSIS — E785 Hyperlipidemia, unspecified: Secondary | ICD-10-CM | POA: Insufficient documentation

## 2019-02-16 DIAGNOSIS — Z882 Allergy status to sulfonamides status: Secondary | ICD-10-CM | POA: Insufficient documentation

## 2019-02-16 DIAGNOSIS — I10 Essential (primary) hypertension: Secondary | ICD-10-CM | POA: Diagnosis not present

## 2019-02-16 DIAGNOSIS — F3162 Bipolar disorder, current episode mixed, moderate: Secondary | ICD-10-CM | POA: Insufficient documentation

## 2019-02-16 DIAGNOSIS — Z794 Long term (current) use of insulin: Secondary | ICD-10-CM | POA: Insufficient documentation

## 2019-02-16 DIAGNOSIS — R569 Unspecified convulsions: Secondary | ICD-10-CM | POA: Insufficient documentation

## 2019-02-16 DIAGNOSIS — Z833 Family history of diabetes mellitus: Secondary | ICD-10-CM | POA: Diagnosis not present

## 2019-02-16 DIAGNOSIS — Z885 Allergy status to narcotic agent status: Secondary | ICD-10-CM | POA: Diagnosis not present

## 2019-02-16 DIAGNOSIS — E669 Obesity, unspecified: Secondary | ICD-10-CM | POA: Insufficient documentation

## 2019-02-16 DIAGNOSIS — Z6841 Body Mass Index (BMI) 40.0 and over, adult: Secondary | ICD-10-CM | POA: Diagnosis not present

## 2019-02-16 LAB — TSH: TSH: 1.671 u[IU]/mL (ref 0.350–4.500)

## 2019-02-16 MED ORDER — LAMOTRIGINE 25 MG PO TABS: 25.0000 mg | ORAL_TABLET | Freq: Every day | ORAL | 0 refills | Status: DC

## 2019-02-16 NOTE — Patient Instructions (Signed)
Lamotrigine tablets What is this medicine? LAMOTRIGINE (la MOE tri jeen) is used to control seizures in adults and children with epilepsy and Lennox-Gastaut syndrome. It is also used in adults to treat bipolar disorder. This medicine may be used for other purposes; ask your health care provider or pharmacist if you have questions. COMMON BRAND NAME(S): Lamictal, Subvenite What should I tell my health care provider before I take this medicine? They need to know if you have any of these conditions: -aseptic meningitis during prior use of lamotrigine -depression -folate deficiency -kidney disease -liver disease -suicidal thoughts, plans, or attempt; a previous suicide attempt by you or a family member -an unusual or allergic reaction to lamotrigine or other seizure medications, other medicines, foods, dyes, or preservatives -pregnant or trying to get pregnant -breast-feeding How should I use this medicine? Take this medicine by mouth with a glass of water. Follow the directions on the prescription label. Do not chew these tablets. If this medicine upsets your stomach, take it with food or milk. Take your doses at regular intervals. Do not take your medicine more often than directed. A special MedGuide will be given to you by the pharmacist with each new prescription and refill. Be sure to read this information carefully each time. Talk to your pediatrician regarding the use of this medicine in children. While this drug may be prescribed for children as young as 2 years for selected conditions, precautions do apply. Overdosage: If you think you have taken too much of this medicine contact a poison control center or emergency room at once. NOTE: This medicine is only for you. Do not share this medicine with others. What if I miss a dose? If you miss a dose, take it as soon as you can. If it is almost time for your next dose, take only that dose. Do not take double or extra doses. What may interact  with this medicine? -atazanavir -carbamazepine -female hormones, including contraceptive or birth control pills -lopinavir -methotrexate -phenobarbital -phenytoin -primidone -pyrimethamine -rifampin -ritonavir -trimethoprim -valproic acid This list may not describe all possible interactions. Give your health care provider a list of all the medicines, herbs, non-prescription drugs, or dietary supplements you use. Also tell them if you smoke, drink alcohol, or use illegal drugs. Some items may interact with your medicine. What should I watch for while using this medicine? Visit your doctor or health care professional for regular checks on your progress. If you take this medicine for seizures, wear a Medic Alert bracelet or necklace. Carry an identification card with information about your condition, medicines, and doctor or health care professional. It is important to take this medicine exactly as directed. When first starting treatment, your dose will need to be adjusted slowly. It may take weeks or months before your dose is stable. You should contact your doctor or health care professional if your seizures get worse or if you have any new types of seizures. Do not stop taking this medicine unless instructed by your doctor or health care professional. Stopping your medicine suddenly can increase your seizures or their severity. Contact your doctor or health care professional right away if you develop a rash while taking this medicine. Rashes may be very severe and sometimes require treatment in the hospital. Deaths from rashes have occurred. Serious rashes occur more often in children than adults taking this medicine. It is more common for these serious rashes to occur during the first 2 months of treatment, but a rash can occur at   any time. You may get drowsy, dizzy, or have blurred vision. Do not drive, use machinery, or do anything that needs mental alertness until you know how this medicine  affects you. To reduce dizzy or fainting spells, do not sit or stand up quickly, especially if you are an older patient. Alcohol can increase drowsiness and dizziness. Avoid alcoholic drinks. If you are taking this medicine for bipolar disorder, it is important to report any changes in your mood to your doctor or health care professional. If your condition gets worse, you get mentally depressed, feel very hyperactive or manic, have difficulty sleeping, or have thoughts of hurting yourself or committing suicide, you need to get help from your health care professional right away. If you are a caregiver for someone taking this medicine for bipolar disorder, you should also report these behavioral changes right away. The use of this medicine may increase the chance of suicidal thoughts or actions. Pay special attention to how you are responding while on this medicine. Your mouth may get dry. Chewing sugarless gum or sucking hard candy, and drinking plenty of water may help. Contact your doctor if the problem does not go away or is severe. Women who become pregnant while using this medicine may enroll in the [place] Antiepileptic Drug Pregnancy Registry by calling 1-888-233-2334. This registry collects information about the safety of antiepileptic drug use during pregnancy. This medicine may cause a decrease in folic acid. You should make sure that you get enough folic acid while you are taking this medicine. Discuss the foods you eat and the vitamins you take with your health care professional. What side effects may I notice from receiving this medicine? Side effects that you should report to your doctor or health care professional as soon as possible: -allergic reactions like skin rash, itching or hives, swelling of the face, lips, or tongue -changes in vision -depressed mood -elevated mood, decreased need for sleep, racing thoughts, impulsive behavior -fever with rash, swollen lymph nodes, or  swelling of the face -loss of balance or coordination -mouth sores -redness, blistering, peeling or loosening of the skin, including inside the mouth -right upper belly pain -seizures -severe muscle pain -signs and symptoms of aseptic meningitis such as stiff neck and sensitivity to light, headache, drowsiness, fever, nausea, vomiting, rash -signs of infection - fever or chills, cough, sore throat, pain or difficulty passing urine -suicidal thoughts or other mood changes -swollen lymph nodes -trouble walking -unusual bruising or bleeding -unusually weak or tired -yellowing of the eyes or skin Side effects that usually do not require medical attention (report to your doctor or health care professional if they continue or are bothersome): -diarrhea -dizziness -dry mouth -stuffy nose -tiredness -tremors -trouble sleeping This list may not describe all possible side effects. Call your doctor for medical advice about side effects. You may report side effects to FDA at 1-800-FDA-1088. Where should I keep my medicine? Keep out of reach of children. Store at room temperature between 15 and 30 degrees C (59 and 86 degrees F). Throw away any unused medicine after the expiration date. NOTE: This sheet is a summary. It may not cover all possible information. If you have questions about this medicine, talk to your doctor, pharmacist, or health care provider.  2019 [employer]vier/Gold Standard (2017-07-12 16:07:39)  
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
## 2019-02-17 ENCOUNTER — Ambulatory Visit: Payer: Medicare Other | Admitting: Nurse Practitioner

## 2019-03-06 ENCOUNTER — Other Ambulatory Visit: Payer: Self-pay

## 2019-03-06 DIAGNOSIS — E0865 Diabetes mellitus due to underlying condition with hyperglycemia: Secondary | ICD-10-CM

## 2019-03-06 DIAGNOSIS — E119 Type 2 diabetes mellitus without complications: Secondary | ICD-10-CM

## 2019-03-06 DIAGNOSIS — Z794 Long term (current) use of insulin: Secondary | ICD-10-CM

## 2019-03-06 NOTE — Telephone Encounter (Signed)
Lab Results  Component Value Date   HGBA1C 8.1 (H) 11/18/2018   Insulin needs clarification One sig says 26 units, another says 110 units Also, is she taking 20 units BID ac or TIC ac (?) I asked her to either send a MyChart message with exactly how much and how often she is giving herself insulin or call the office with updates

## 2019-03-07 ENCOUNTER — Ambulatory Visit (INDEPENDENT_AMBULATORY_CARE_PROVIDER_SITE_OTHER): Payer: 59 | Admitting: Psychiatry

## 2019-03-07 ENCOUNTER — Encounter: Payer: Self-pay | Admitting: Psychiatry

## 2019-03-07 ENCOUNTER — Other Ambulatory Visit: Payer: Self-pay | Admitting: Family Medicine

## 2019-03-07 ENCOUNTER — Other Ambulatory Visit: Payer: Self-pay

## 2019-03-07 ENCOUNTER — Encounter: Payer: Self-pay | Admitting: Family Medicine

## 2019-03-07 DIAGNOSIS — F4310 Post-traumatic stress disorder, unspecified: Secondary | ICD-10-CM

## 2019-03-07 DIAGNOSIS — F3162 Bipolar disorder, current episode mixed, moderate: Secondary | ICD-10-CM

## 2019-03-07 DIAGNOSIS — F17200 Nicotine dependence, unspecified, uncomplicated: Secondary | ICD-10-CM

## 2019-03-07 DIAGNOSIS — F17210 Nicotine dependence, cigarettes, uncomplicated: Secondary | ICD-10-CM

## 2019-03-07 MED ORDER — LAMOTRIGINE 25 MG PO TABS: 75.0000 mg | ORAL_TABLET | Freq: Every day | ORAL | 0 refills | Status: DC

## 2019-03-07 NOTE — Telephone Encounter (Signed)
Please call patient about insulins Please update med list / refill request based on what she is actually giving herself right now Thank you

## 2019-03-07 NOTE — Progress Notes (Signed)
Virtual Visit via Telephone Note  I connected with Debra [name] on 03/07/19 at  4:30 PM EDT by telephone and verified that I am speaking with the correct person using two identifiers.   I discussed the limitations, risks, security and privacy concerns of performing an evaluation and management service by telephone and the availability of in person appointments. I also discussed with the patient that there may be a patient responsible charge related to this service. The patient expressed understanding and agreed to proceed.   I discussed the assessment and treatment plan with the patient. The patient was provided an opportunity to ask questions and all were answered. The patient agreed with the plan and demonstrated an understanding of the instructions.   The patient was advised to call back or seek an in-person evaluation if the symptoms worsen or if the condition fails to improve as anticipated.  I provided 15 minutes of non-face-to-face time during this encounter.   Ursula Alert, MD  [facility] MD  OP Progress Note  03/07/2019 4:52 PM Debra [name]  MRN:  [id number]  Chief Complaint:  Chief Complaint    Follow-up     HPI: Debra [name] is a 58 year old Caucasian female, married, on disability, lives in [place], has a history of bipolar disorder, diabetes melitis, parkinsonian tremors, chronic back pain, hypertension, obstructive sleep apnea, possible REM sleep behavior disorder, was evaluated by phone today.  She today reports she has been having some anxiety symptoms as well as mood lability recently.  She reports she is stressed out about the coronavirus outbreak.  She also reports she has been having some problems at home, her dishwasher and her sink needs to be repaired and she cannot find anyone to do the job due to the coronavirus outbreak.  It is making her extremely difficult for her to cook.  She is worried about how she is going to manage if this is not going to be repaired soon.  She tried  calling her company and they told her they cannot come in due to the outbreak.  Patient reports sleep has been good on the melatonin.  Patient denies any perceptual disturbances.  She denies any suicidality or homicidality.  She reports she is tolerating the Lamictal well.  She is interested in a dosage increase. Visit Diagnosis:    ICD-10-CM   1. Bipolar 1 disorder, mixed, moderate (HCC) F31.62 lamoTRIgine (LAMICTAL) 25 MG tablet  2. PTSD (post-traumatic stress disorder) F43.10   3. Tobacco use disorder F17.200     Past Psychiatric History: Reviewed past psychiatric history from my progress note on 02/16/2019.  Past trials of Paxil, Depakote, Haldol, trazodone, melatonin  Past Medical History:  Past Medical History:  Diagnosis Date  . Anxiety   . Arthritis   . C. difficile colitis   . C. difficile colitis   . Depression   . Diabetes mellitus without complication ([place])    Type II  . Diabetes mellitus, type II ([place])   . Family history of adverse reaction to anesthesia    Mother - BP drops  . Head injury, closed, with brief LOC ([place]) 2011ish  . Hyperlipidemia   . Hypertension   . Insomnia   . Myalgia 03/29/2018   Due to statin  . Obesity   . Parkinson's disease ([place])   . Seasonal allergies   . Seizures ([place])   . Tobacco abuse     Past Surgical History:  Procedure Laterality Date  . ABDOMINAL HYSTERECTOMY  2012  . BREAST  BIOPSY Right   . CESAREAN SECTION  1992  . CHOLECYSTECTOMY  2013  . LUMBAR LAMINECTOMY/DECOMPRESSION MICRODISCECTOMY Left 09/10/2015   Procedure: Left L4-5 Foraminotomy/Left L5-S1 Diskectomy;  Surgeon: Leeroy Cha, MD;  Location: [facility];  Service: Neurosurgery;  Laterality: Left;  Left L4-5 Foraminotomy/Left L5-S1 Diskectomy    Family Psychiatric History: Reviewed family psychiatric history from my progress note on 02/16/2019  Family History:  Family History  Problem Relation Age of Onset  . COPD Mother   . Bipolar disorder Father   .  Diabetes Mellitus II Father   . Bipolar disorder Brother   . Diabetes Mellitus II Brother   . Bipolar disorder Brother     Social History: Reviewed social history from my progress note on 02/16/2019 Social History   Socioeconomic History  . Marital status: Married    Spouse name: [name]  . Number of children: 1  . Years of education: Not on file  . Highest education level: Associate degree: occupational, Hotel manager, or vocational program  Occupational History  . Not on file  Social Needs  . Financial resource strain: Somewhat hard  . Food insecurity:    Worry: Often true    Inability: Often true  . Transportation needs:    Medical: No    Non-medical: No  Tobacco Use  . Smoking status: Current Every Day Smoker    Packs/day: 0.50    Years: 24.00    Pack years: 12.00    Types: Cigarettes  . Smokeless tobacco: Never Used  Substance and Sexual Activity  . Alcohol use: Yes    Alcohol/week: 0.0 standard drinks    Comment: occasional- rare  . Drug use: No  . Sexual activity: Not Currently  Lifestyle  . Physical activity:    Days per week: 0 days    Minutes per session: 0 min  . Stress: Only a little  Relationships  . Social connections:    Talks on phone: Twice a week    Gets together: Twice a week    Attends religious service: Never    Active member of club or organization: No    Attends meetings of clubs or organizations: Never    Relationship status: Married  Other Topics Concern  . Not on file  Social History Narrative   Live in private residence w/ husband (apartment complex)    Allergies:  Allergies  Allergen Reactions  . Clindamycin/Lincomycin     Pt states caused C-Diff  . Metformin And Related Diarrhea  . Benadryl [Diphenhydramine Hcl] Swelling    blistering  . Canagliflozin Other (See Comments)    Other reaction(s): Other (See Comments) Low bp Low bp Low bp  . Depakote Er [Divalproex Sodium Er] Other (See Comments)    seizures  . Dilaudid  [Hydromorphone Hcl] Other (See Comments)    seizures  . Diphenhydramine-Zinc Acetate Other (See Comments)  . Erythromycin   . Ethanol   . Haloperidol Other (See Comments)  . Hydromorphone Other (See Comments)  . Neosporin [Neomycin-Bacitracin Zn-Polymyx] Swelling    blistering  . Singulair [Montelukast Sodium] Other (See Comments)    Asthma attacks  . Sitagliptin Other (See Comments)  . Statins Other (See Comments)    Other reaction(s): Unknown  . Sulfa Antibiotics Hives    Other reaction(s): Unknown  . Victoza [Liraglutide] Other (See Comments) and Nausea And Vomiting    Other reaction(s): Other (See Comments) pancreatitis pancreatitis    Metabolic Disorder Labs: Lab Results  Component Value Date   HGBA1C 8.1 (  H) 11/18/2018   MPG 186 11/18/2018   MPG 186 08/19/2018   No results found for: PROLACTIN Lab Results  Component Value Date   CHOL 218 (H) 11/18/2018   TRIG 139 11/18/2018   HDL 45 (L) 11/18/2018   CHOLHDL 4.8 11/18/2018   LDLCALC 147 (H) 11/18/2018   LDLCALC 150 (H) 08/19/2018   Lab Results  Component Value Date   TSH 1.671 02/16/2019   TSH 2.160 08/20/2015    Therapeutic Level Labs: No results found for: LITHIUM No results found for: VALPROATE No components found for:  CBMZ  Current Medications: Current Outpatient Medications  Medication Sig Dispense Refill  . aspirin EC 81 MG tablet Take 81 mg by mouth daily.    . blood glucose meter kit and supplies Use up to four times daily as directed, Dx E11.65, LON 99 months 1 each 0  . calcium citrate-vitamin D (CITRACAL+D) 315-200 MG-UNIT tablet Take 1 tablet by mouth 2 (two) times daily.    . Cholecalciferol 5000 units capsule Take 5,000 Units by mouth.     . Colesevelam HCl (WELCHOL) 3.75 g PACK Take 0.5 packets by mouth daily. Mixed in liquid 30 packet 11  . dapagliflozin propanediol (FARXIGA) 10 MG TABS tablet Take 10 mg by mouth daily. 90 tablet 1  . DIFICID 200 MG TABS tablet Take 200 mg by mouth 2  (two) times daily.     . fluconazole (DIFLUCAN) 150 MG tablet Take 1 tablet (150 mg total) by mouth daily. Do not take cholesterol medicine (statin) for 3 days 1 tablet 0  . folic acid (FOLVITE) 400 MCG tablet Take 800 mcg by mouth daily.     [name] glucose blood (ONE TOUCH ULTRA TEST) test strip USE UP TO 4 TIMES A DAY AS DIRECTED DX: E11.65 300 each 0  . HUMALOG KWIKPEN 100 UNIT/ML KiwkPen Inject into the skin. Give subcutaneously with meals, 20 units in the AM and 20 units with evening meal    . hydrochlorothiazide (HYDRODIURIL) 25 MG tablet TAKE 1 TABLET DAILY (DOSE INCREASE, REPLACES ALL OTHER HCTZ PRESCRIPTIONS) 90 tablet 1  . Insulin Pen Needle 31G X 6 MM MISC For use with insulin injections, up to four injections daily 300 each 3  . lamoTRIgine (LAMICTAL) 25 MG tablet Take 3 tablets (75 mg total) by mouth daily. Start taking 50 mg for 2 weeks and increase to 75 mg after 2 weeks. 90 tablet 0  . lisinopril (PRINIVIL,ZESTRIL) 30 MG tablet TAKE 1 TABLET BY MOUTH EVERY DAY 30 tablet 5  . Magnesium 400 MG CAPS Take 250 mg by mouth daily.     . Melatonin 10 MG TABS Take 1 tablet by mouth at bedtime.    . Multiple Vitamins-Minerals (CENTRUM WOMEN) TABS Take 1 tablet by mouth.    Glory Rosebush DELICA LANCETS 86P MISC Check fingerstick blood sugars four times a day; E11.65, LON 99 months 300 each 3  . PARoxetine (PAXIL) 40 MG tablet TAKE 1 TABLET EVERY MORNING (DOSE CHANGE) 90 tablet 1  . pioglitazone (ACTOS) 30 MG tablet Take 1 tablet (30 mg total) by mouth daily. 90 tablet 3  . propranolol ER (INDERAL LA) 80 MG 24 hr capsule TAKE 1 CAPSULE (80 MG TOTAL) BY MOUTH ONCE DAILY  3  . TRESIBA FLEXTOUCH 200 UNIT/ML SOPN Inject 26 Units into the skin at bedtime. (Patient taking differently: Inject 110 Units into the skin at bedtime. )     No current facility-administered medications for this visit.  Musculoskeletal: Strength & Muscle Tone: UTA Gait & Station: UTA Patient leans: N/A  Psychiatric  Specialty Exam: Review of Systems  Psychiatric/Behavioral: The patient is nervous/anxious.   All other systems reviewed and are negative.   There were no vitals taken for this visit.There is no height or weight on file to calculate BMI.  General Appearance: UTA  Eye Contact:  UTA  Speech:  Clear and Coherent  Volume:  Decreased  Mood:  Anxious  Affect:  UTA  Thought Process:  Goal Directed and Descriptions of Associations: Intact  Orientation:  Full (Time, Place, and Person)  Thought Content: Logical   Suicidal Thoughts:  No  Homicidal Thoughts:  No  Memory:  Immediate;   Fair Recent;   Fair Remote;   Fair  Judgement:  Fair  Insight:  Fair  Psychomotor Activity:  UTA  Concentration:  Concentration: Fair and Attention Span: Fair  Recall:  [employer] of Knowledge: Fair  Language: Fair  Akathisia:  No  Handed:  Right  AIMS (if indicated): NA  Assets:  Communication Skills Desire for Improvement Social Support  ADL's:  Intact  Cognition: WNL  Sleep:  Fair   Screenings: Mini-Mental     Office Visit from 09/21/2016 in [facility]  Total Score (max 30 points )  27    PHQ2-9     Office Visit from 11/18/2018 in [facility] Office Visit from 08/19/2018 in [facility] Office Visit from 08/17/2018 in [facility] Office Visit from 05/18/2018 in [facility] Office Visit from 04/22/2018 in [facility]  PHQ-2 Total Score  2  3  0  2  0  PHQ-9 Total Score  8  14  -  11  -       Assessment and Plan: Debra [name] is a 58 year old Caucasian female, married, on disability, lives in [place], has a history of bipolar disorder, PTSD, hypertension, diabetes melitis, OSA, chronic pain was evaluated by phone today.  Patient is biologically predisposed given her history of trauma, family history of mental health problems.  She also has psychosocial stressors of having  chronic mental health problems, relationship struggles as well as recent coronavirus outbreak.  Patient will benefit from medication readjustment as she continues to struggle with mood symptoms.  Plan as discussed below.  Plan Bipolar disorder-unstable Increase Lamictal to 50 mg for 2 weeks and to 75 mg after 2 weeks. Continue Paxil 40 mg p.o. daily.  For PTSD- some progress Paxil as prescribed. She will benefit from trauma focused therapy and was referred for the same.  For insomnia-improving Melatonin as prescribed by her neurologist. She had sleep study done-pending report.  For tobacco use disorder-unstable Patient is not ready to quit.  Divided smoking cessation counseling.  I have reviewed TSH- labs dated 02/16/2019--within normal limits.  Follow up in clinic in 1 month or sooner if needed.  I have spent atleast 15 minutes non face to face with patient today. More than 50 % of the time was spent for psychoeducation and supportive psychotherapy and care coordination.  This note was generated in part or whole with voice recognition software. Voice recognition is usually quite accurate but there are transcription errors that can and very often do occur. I apologize for any typographical errors that were not detected and corrected.           Ursula Alert, MD 03/07/2019, 4:52 PM

## 2019-03-08 MED ORDER — LISINOPRIL 30 MG PO TABS: 30.0000 mg | ORAL_TABLET | Freq: Every day | ORAL | 1 refills | Status: DC

## 2019-03-08 NOTE — Telephone Encounter (Signed)
Lab Results  Component Value Date   K 4.2 08/19/2018   Lab Results  Component Value Date   CREATININE 0.82 08/19/2018   Lisinopril 40 mg requested Wrong strength Denied New Rx for 30 mg sent to mail order

## 2019-03-08 NOTE — Telephone Encounter (Signed)
Pt sent info in via mychart.

## 2019-03-09 MED ORDER — TRESIBA FLEXTOUCH 200 UNIT/ML ~~LOC~~ SOPN: 120.0000 [IU] | PEN_INJECTOR | Freq: Every day | SUBCUTANEOUS | 5 refills | Status: AC

## 2019-03-09 MED ORDER — HUMALOG KWIKPEN 100 UNIT/ML ~~LOC~~ SOPN: PEN_INJECTOR | SUBCUTANEOUS | 5 refills | Status: DC

## 2019-03-09 NOTE — Telephone Encounter (Signed)
I updated the short-acting and sent 3 month supplies

## 2019-03-09 NOTE — Telephone Encounter (Signed)
Updated.

## 2019-03-10 ENCOUNTER — Other Ambulatory Visit: Payer: Self-pay | Admitting: Psychiatry

## 2019-03-10 DIAGNOSIS — F3162 Bipolar disorder, current episode mixed, moderate: Secondary | ICD-10-CM

## 2019-03-14 ENCOUNTER — Encounter: Payer: Self-pay | Admitting: Licensed Clinical Social Worker

## 2019-03-14 ENCOUNTER — Other Ambulatory Visit: Payer: Self-pay

## 2019-03-14 ENCOUNTER — Ambulatory Visit (INDEPENDENT_AMBULATORY_CARE_PROVIDER_SITE_OTHER): Payer: 59 | Admitting: Licensed Clinical Social Worker

## 2019-03-14 DIAGNOSIS — F3162 Bipolar disorder, current episode mixed, moderate: Secondary | ICD-10-CM

## 2019-03-14 DIAGNOSIS — F4310 Post-traumatic stress disorder, unspecified: Secondary | ICD-10-CM
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
## 2019-03-19 ENCOUNTER — Other Ambulatory Visit: Payer: Self-pay | Admitting: Family Medicine

## 2019-03-19 DIAGNOSIS — E0865 Diabetes mellitus due to underlying condition with hyperglycemia: Secondary | ICD-10-CM

## 2019-03-19 DIAGNOSIS — E119 Type 2 diabetes mellitus without complications: Secondary | ICD-10-CM

## 2019-03-19 DIAGNOSIS — Z794 Long term (current) use of insulin: Principal | ICD-10-CM

## 2019-03-20 ENCOUNTER — Ambulatory Visit (INDEPENDENT_AMBULATORY_CARE_PROVIDER_SITE_OTHER): Payer: Managed Care, Other (non HMO) | Admitting: Family Medicine

## 2019-03-20 ENCOUNTER — Encounter: Payer: Self-pay | Admitting: Family Medicine

## 2019-03-20 ENCOUNTER — Other Ambulatory Visit: Payer: Self-pay

## 2019-03-20 VITALS — BP 125/70 | HR 69 | Temp 97.5°F | Ht 64.5 in | Wt 275.0 lb

## 2019-03-20 DIAGNOSIS — I10 Essential (primary) hypertension: Secondary | ICD-10-CM | POA: Diagnosis not present

## 2019-03-20 DIAGNOSIS — K0889 Other specified disorders of teeth and supporting structures: Secondary | ICD-10-CM

## 2019-03-20 DIAGNOSIS — I70209 Unspecified atherosclerosis of native arteries of extremities, unspecified extremity: Secondary | ICD-10-CM

## 2019-03-20 DIAGNOSIS — IMO0002 Reserved for concepts with insufficient information to code with codable children: Secondary | ICD-10-CM

## 2019-03-20 DIAGNOSIS — F3178 Bipolar disorder, in full remission, most recent episode mixed: Secondary | ICD-10-CM

## 2019-03-20 DIAGNOSIS — E782 Mixed hyperlipidemia: Secondary | ICD-10-CM

## 2019-03-20 DIAGNOSIS — Z5181 Encounter for therapeutic drug level monitoring: Secondary | ICD-10-CM

## 2019-03-20 DIAGNOSIS — E1165 Type 2 diabetes mellitus with hyperglycemia: Secondary | ICD-10-CM

## 2019-03-20 DIAGNOSIS — E1151 Type 2 diabetes mellitus with diabetic peripheral angiopathy without gangrene: Secondary | ICD-10-CM | POA: Diagnosis not present

## 2019-03-20 MED ORDER — LISINOPRIL 40 MG PO TABS: 40.0000 mg | ORAL_TABLET | Freq: Every day | ORAL | 3 refills | Status: DC

## 2019-03-20 NOTE — Assessment & Plan Note (Signed)
Seeing psychiatrist; doing well on current medicine

## 2019-03-20 NOTE — Progress Notes (Signed)
BP 125/70 (BP Location: Left Arm, Patient Position: Sitting, Cuff Size: Large)   Pulse 69   Temp (!) 97.5 F (36.4 C) (Oral)   Ht 5' 4.5" (1.638 m)   Wt 275 lb (124.7 kg)   BMI 46.47 kg/m    Subjective:    Patient ID: Debra [name], female    DOB: 01/14/1961, 57 y.o.   MRN: [id number]  HPI: Debra [name] is a 58 y.o. female  Chief Complaint  Patient presents with  . Medication Refill    HPI Virtual Visit via Telephone/Video Note   I connected with the patient via:  telephone I verified that I am speaking with the correct person using two identifiers.  Call started: 1:36 pm Call terminated: 1:55 pm Total length of call: 19 minutes 51 seconds   I discussed the limitations, risks, and privacy concerns of performing an evaluation and management service by telephone and the availability of in-person appointments. I explained that he/she may be responsible for charges related to this service. The patient expressed understanding and agreed to proceed.  Provider location: home, upstairs office with door closed, earphones/headset on Patient location: home Additional participants: husband is there, mother is there, okay if they overhear  Hypertension Lisinopril was decreased but patient has been taking 40 mg and had been doing just fine; she took her BP wrong she says; it was 150 over whatever, 125/70 actually; updated; she is trying to avoid salt  Type 2 diabetes mellitus 130-170 range; very stable as long as remembering to take her [place]; avoiding sugary drinks; white wheat bread; no problems with feet; did have episode of low sugar when moving, forgot to eat, wasn't paying attention, would skip a meal; numbers are good now  Lab Results  Component Value Date   HGBA1C 8.1 (H) 11/18/2018   Dental pain; jaw pain; she can't go to her dentist; no fever; no pus in the gums; just a hole in the tooth, just hurts; into the ear (referred); friend has a dentist who can see her if needed;  it's tolerable; taking ibuprofen for the pain; she does not take tylenol but willing to take (per package directions)  Obesity; discussed in context of COVID-19 outbreak; she does not think this has impacted her; down with the move, has lost 20 pounds and she is eating better; cannot go out to eat; eating at home  Bipolar d/o; she is doing well; she saw the psychiatrist, Dr. Elna Breslow; another medicine was started (lamictal) and will be increased soon  Depression screen [facility] 2/9 03/20/2019 11/18/2018 08/19/2018 08/17/2018 05/18/2018  Decreased Interest 0 1 0 0 0  Down, Depressed, Hopeless 0 1 3 0 2  PHQ - 2 Score 0 2 3 0 2  Altered sleeping 0 2 3 - 3  Tired, decreased energy 0 2 3 - 3  Change in appetite 0 1 3 - 2  Feeling bad or failure about yourself  0 0 1 - 0  Trouble concentrating 0 1 1 - 1  Moving slowly or fidgety/restless 0 0 0 - 0  Suicidal thoughts 0 0 0 - 0  PHQ-9 Score 0 8 14 - 11  Difficult doing work/chores Not difficult at all Somewhat difficult Not difficult at all - Somewhat difficult  Some recent data might be hidden   Fall Risk  03/20/2019 11/18/2018 08/19/2018 08/17/2018 05/18/2018  Falls in the past year? 0 0 No No No  Number falls in past yr: 0 - - - -  Injury with Fall? 0 - - - -  Comment - - - - -  Risk Factor Category  - - - - -  Comment - - - - -  Risk for fall due to : - - - - -  Risk for fall due to: Comment - - - - -  Follow up - - - - -    Relevant past medical, surgical, family and social history reviewed Past Medical History:  Diagnosis Date  . Anxiety   . Arthritis   . C. difficile colitis   . C. difficile colitis   . Depression   . Diabetes mellitus without complication (HCC)    Type II  . Diabetes mellitus, type II (HCC)   . Family history of adverse reaction to anesthesia    Mother - BP drops  . Head injury, closed, with brief LOC (HCC) 2011ish  . Hyperlipidemia   . Hypertension   . Insomnia   . Myalgia 03/29/2018   Due to statin  . Obesity    . Parkinson's disease (HCC)   . Seasonal allergies   . Seizures (HCC)   . Tobacco abuse    Past Surgical History:  Procedure Laterality Date  . ABDOMINAL HYSTERECTOMY  2012  . BREAST BIOPSY Right   . CESAREAN SECTION  1992  . CHOLECYSTECTOMY  2013  . LUMBAR LAMINECTOMY/DECOMPRESSION MICRODISCECTOMY Left 09/10/2015   Procedure: Left L4-5 Foraminotomy/Left L5-S1 Diskectomy;  Surgeon: Hilda Lias, MD;  Location: [facility];  Service: Neurosurgery;  Laterality: Left;  Left L4-5 Foraminotomy/Left L5-S1 Diskectomy   Family History  Problem Relation Age of Onset  . COPD Mother   . Bipolar disorder Father   . Diabetes Mellitus II Father   . Bipolar disorder Brother   . Diabetes Mellitus II Brother   . Bipolar disorder Brother    Social History   Tobacco Use  . Smoking status: Current Every Day Smoker    Packs/day: 0.50    Years: 24.00    Pack years: 12.00    Types: Cigarettes    Start date: 03/19/2004  . Smokeless tobacco: Never Used  Substance Use Topics  . Alcohol use: Yes    Alcohol/week: 0.0 standard drinks    Comment: occasional- rare  . Drug use: No     Office Visit from 03/20/2019 in [facility]  AUDIT-C Score  1      Interim medical history since last visit reviewed. Allergies and medications reviewed  Review of Systems Per HPI unless specifically indicated above     Objective:    BP 125/70 (BP Location: Left Arm, Patient Position: Sitting, Cuff Size: Large)   Pulse 69   Temp (!) 97.5 F (36.4 C) (Oral)   Ht 5' 4.5" (1.638 m)   Wt 275 lb (124.7 kg)   BMI 46.47 kg/m   Wt Readings from Last 3 Encounters:  03/20/19 275 lb (124.7 kg)  11/18/18 273 lb 1.6 oz (123.9 kg)  09/15/18 264 lb 12.8 oz (120.1 kg)    Physical Exam Pulmonary:     Effort: No respiratory distress.  Neurological:     Mental Status: She is alert.     Cranial Nerves: No dysarthria.  Psychiatric:        Speech: Speech is not rapid and pressured, delayed or  slurred.    Results for orders placed or performed during the hospital encounter of 02/16/19  TSH  Result Value Ref Range   TSH 1.671 0.350 - 4.500 uIU/mL  Assessment & Plan:   Problem List Items Addressed This Visit      Cardiovascular and Mediastinum   Uncontrolled diabetes mellitus type 2 with atherosclerosis of arteries of extremities (HCC)    Due for A1c but not bringing her in b/c of COVID-19 pandemic; patient informed she is high risk of complications if she gets COVID-19; limiting exposures, wears mask; check feet; she will schedule eye exam when COVID-19 pandemic slows down and it's safe to go to eye doctor      Relevant Medications   lisinopril (PRINIVIL,ZESTRIL) 40 MG tablet   Other Relevant Orders   Lipid panel   Hemoglobin A1c   BP (high blood pressure) - Primary    Controlled on the 40 mg lisinopril; refills sent      Relevant Medications   lisinopril (PRINIVIL,ZESTRIL) 40 MG tablet     Other   Hyperlipidemia   Relevant Medications   lisinopril (PRINIVIL,ZESTRIL) 40 MG tablet   Other Relevant Orders   Lipid panel   Morbid obesity (HCC)    Encouraged weight loss, healthy eating; activity in spite of social isolation encouraged      Bipolar disorder [facility](HCC)    Seeing psychiatrist; doing well on current medicine       Other Visit Diagnoses    Pain, dental       tylenol per package directions better than ibuprofen; to dentist if needed   Medication monitoring encounter       Relevant Orders   COMPLETE METABOLIC PANEL WITH GFR       Follow up plan: Return in about 3 months (around 06/19/2019) for follow-up visit with Dr. Sherie DonLada; labs in 1-2 months only.  An after-visit summary was printed and given to the patient at check-out.  Please see the patient instructions which may contain other information and recommendations beyond what is mentioned above in the assessment and plan.  Meds ordered this encounter  Medications  . lisinopril  (PRINIVIL,ZESTRIL) 40 MG tablet    Sig: Take 1 tablet (40 mg total) by mouth daily.    Dispense:  90 tablet    Refill:  3    Orders Placed This Encounter  Procedures  . Lipid panel  . Hemoglobin A1c  . COMPLETE METABOLIC PANEL WITH GFR

## 2019-03-20 NOTE — Assessment & Plan Note (Signed)
Encouraged weight loss, healthy eating; activity in spite of social isolation encouraged

## 2019-03-20 NOTE — Assessment & Plan Note (Signed)
Controlled on the 40 mg lisinopril; refills sent

## 2019-03-20 NOTE — Assessment & Plan Note (Signed)
Due for A1c but not bringing her in b/c of COVID-19 pandemic; patient informed she is high risk of complications if she gets COVID-19; limiting exposures, wears mask; check feet; she will schedule eye exam when COVID-19 pandemic slows down and it's safe to go to eye doctor

## 2019-03-24 ENCOUNTER — Ambulatory Visit: Payer: Self-pay

## 2019-03-24 ENCOUNTER — Ambulatory Visit: Payer: Medicare Other | Admitting: Licensed Clinical Social Worker

## 2019-03-30 ENCOUNTER — Other Ambulatory Visit: Payer: Self-pay | Admitting: Psychiatry

## 2019-03-30 DIAGNOSIS — F3162 Bipolar disorder, current episode mixed, moderate: Secondary | ICD-10-CM

## 2019-03-30 MED ORDER — LAMOTRIGINE 25 MG PO TABS: 75.0000 mg | ORAL_TABLET | Freq: Every day | ORAL | 1 refills | Status: DC

## 2019-03-30 NOTE — Telephone Encounter (Signed)
Sent lamictal to pharmacy 

## 2019-04-02 ENCOUNTER — Encounter: Payer: Self-pay | Admitting: Family Medicine

## 2019-04-02 DIAGNOSIS — E1165 Type 2 diabetes mellitus with hyperglycemia: Principal | ICD-10-CM

## 2019-04-02 DIAGNOSIS — I70209 Unspecified atherosclerosis of native arteries of extremities, unspecified extremity: Principal | ICD-10-CM

## 2019-04-02 DIAGNOSIS — E1151 Type 2 diabetes mellitus with diabetic peripheral angiopathy without gangrene: Secondary | ICD-10-CM

## 2019-04-02 DIAGNOSIS — IMO0002 Reserved for concepts with insufficient information to code with codable children: Secondary | ICD-10-CM

## 2019-04-03 MED ORDER — INSULIN PEN NEEDLE 29G X 12.7MM MISC: 1.0000 | 3 refills | Status: AC | PRN

## 2019-04-11 ENCOUNTER — Ambulatory Visit (INDEPENDENT_AMBULATORY_CARE_PROVIDER_SITE_OTHER): Payer: Medicare Other | Admitting: Psychiatry

## 2019-04-11 ENCOUNTER — Other Ambulatory Visit: Payer: Self-pay

## 2019-04-11 DIAGNOSIS — Z5329 Procedure and treatment not carried out because of patient's decision for other reasons: Secondary | ICD-10-CM

## 2019-04-13 ENCOUNTER — Telehealth (INDEPENDENT_AMBULATORY_CARE_PROVIDER_SITE_OTHER): Payer: Managed Care, Other (non HMO) | Admitting: Cardiovascular Disease

## 2019-04-13 ENCOUNTER — Other Ambulatory Visit: Payer: Self-pay

## 2019-04-13 ENCOUNTER — Telehealth: Payer: Self-pay | Admitting: Cardiovascular Disease

## 2019-04-13 DIAGNOSIS — E782 Mixed hyperlipidemia: Secondary | ICD-10-CM

## 2019-04-13 DIAGNOSIS — I10 Essential (primary) hypertension: Secondary | ICD-10-CM | POA: Diagnosis not present

## 2019-04-13 DIAGNOSIS — E1151 Type 2 diabetes mellitus with diabetic peripheral angiopathy without gangrene: Secondary | ICD-10-CM

## 2019-04-13 DIAGNOSIS — I700 Atherosclerosis of aorta: Secondary | ICD-10-CM | POA: Diagnosis not present

## 2019-04-13 DIAGNOSIS — R002 Palpitations: Secondary | ICD-10-CM

## 2019-04-13 DIAGNOSIS — R0789 Other chest pain: Secondary | ICD-10-CM

## 2019-04-13 NOTE — Telephone Encounter (Signed)
Virtual Visit Pre-Appointment Phone Call  "(Name), I am calling you today to discuss your upcoming appointment. We are currently trying to limit exposure to the virus that causes COVID-19 by seeing patients at home rather than in the office."  1. "What is the BEST phone number to call the day of the visit?" - include this in appointment notes  2. Do you have or have access to (through a family member/friend) a smartphone with video capability that we can use for your visit?" a. If yes - list this number in appt notes as cell (if different from BEST phone #) and list the appointment type as a VIDEO visit in appointment notes b. If no - list the appointment type as a PHONE visit in appointment notes  3. Confirm consent - "In the setting of the current Covid19 crisis, you are scheduled for a (phone or video) visit with your provider on (date) at (time).  Just as we do with many in-office visits, in order for you to participate in this visit, we must obtain consent.  If you'd like, I can send this to your mychart (if signed up) or email for you to review.  Otherwise, I can obtain your verbal consent now.  All virtual visits are billed to your insurance company just like a normal visit would be.  By agreeing to a virtual visit, we'd like you to understand that the technology does not allow for your provider to perform an examination, and thus may limit your provider's ability to fully assess your condition. If your provider identifies any concerns that need to be evaluated in person, we will make arrangements to do so.  Finally, though the technology is pretty good, we cannot assure that it will always work on either your or our end, and in the setting of a video visit, we may have to convert it to a phone-only visit.  In either situation, we cannot ensure that we have a secure connection.  Are you willing to proceed?" STAFF: Did the patient verbally acknowledge consent to telehealth visit? Document  YES/NO here: yes  4. Advise patient to be prepared - "Two hours prior to your appointment, go ahead and check your blood pressure, pulse, oxygen saturation, and your weight (if you have the equipment to check those) and write them all down. When your visit starts, your provider will ask you for this information. If you have an Apple Watch or Kardia device, please plan to have heart rate information ready on the day of your appointment. Please have a pen and paper handy nearby the day of the visit as well."  5. Give patient instructions for MyChart download to smartphone OR [email or web address].me as below if video visit (depending on what platform provider is using)  6. Inform patient they will receive a phone call 15 minutes prior to their appointment time (may be from unknown caller ID) so they should be prepared to answer    TELEPHONE CALL NOTE  Debra [name] has been deemed a candidate for a follow-up tele-health visit to limit community exposure during the Covid-19 pandemic. I spoke with the patient via phone to ensure availability of phone/video source, confirm preferred email & phone number, and discuss instructions and expectations.  I reminded Debra [name] to be prepared with any vital sign and/or heart rhythm information that could potentially be obtained via home monitoring, at the time of her visit. I reminded Debra [name] to expect a phone call prior to  her visit.  Joline Maxcy 04/13/2019 12:59 PM   INSTRUCTIONS FOR DOWNLOADING THE MYCHART APP TO SMARTPHONE  - The patient must first make sure to have activated MyChart and know their login information - If Apple, go to [employer] and type in MyChart in the search bar and download the app. If Android, ask patient to go to [employer] and type in [place] in the search bar and download the app. The app is free but as with any other app downloads, their phone may require them to verify saved payment information or Apple/Android password.  -  The patient will need to then log into the app with their MyChart username and password, and select [facility] as their healthcare provider to link the account. When it is time for your visit, go to the MyChart app, find appointments, and click Begin Video Visit. Be sure to Select Allow for your device to access the Microphone and Camera for your visit. You will then be connected, and your provider will be with you shortly.  **If they have any issues connecting, or need assistance please contact MyChart service desk [phone])**  **If using a computer, in order to ensure the best quality for their visit they will need to use either of the following Internet Browsers: [employer] or Google Chrome**  IF USING DOXIMITY or DOXY.ME - The patient will receive a link just prior to their visit by text.     FULL LENGTH CONSENT FOR TELE-HEALTH VISIT   I hereby voluntarily request, consent and authorize [facility] and its employed or contracted physicians, physician assistants, nurse practitioners or other licensed health care professionals (the Practitioner), to provide me with telemedicine health care services (the Services") as deemed necessary by the treating Practitioner. I acknowledge and consent to receive the Services by the Practitioner via telemedicine. I understand that the telemedicine visit will involve communicating with the Practitioner through live audiovisual communication technology and the disclosure of certain medical information by electronic transmission. I acknowledge that I have been given the opportunity to request an in-person assessment or other available alternative prior to the telemedicine visit and am voluntarily participating in the telemedicine visit.  I understand that I have the right to withhold or withdraw my consent to the use of telemedicine in the course of my care at any time, without affecting my right to future care or treatment, and that the  Practitioner or I may terminate the telemedicine visit at any time. I understand that I have the right to inspect all information obtained and/or recorded in the course of the telemedicine visit and may receive copies of available information for a reasonable fee.  I understand that some of the potential risks of receiving the Services via telemedicine include:   Delay or interruption in medical evaluation due to technological equipment failure or disruption;  Information transmitted may not be sufficient (e.g. poor resolution of images) to allow for appropriate medical decision making by the Practitioner; and/or   In rare instances, security protocols could fail, causing a breach of personal health information.  Furthermore, I acknowledge that it is my responsibility to provide information about my medical history, conditions and care that is complete and accurate to the best of my ability. I acknowledge that Practitioner's advice, recommendations, and/or decision may be based on factors not within their control, such as incomplete or inaccurate data provided by me or distortions of diagnostic images or specimens that may result from electronic transmissions. I  understand that the practice of medicine is not an exact science and that Practitioner makes no warranties or guarantees regarding treatment outcomes. I acknowledge that I will receive a copy of this consent concurrently upon execution via email to the email address I last provided but may also request a printed copy by calling the office of [facility].    I understand that my insurance will be billed for this visit.   I have read or had this consent read to me.  I understand the contents of this consent, which adequately explains the benefits and risks of the Services being provided via telemedicine.   I have been provided ample opportunity to ask questions regarding this consent and the Services and have had my questions answered to my  satisfaction.  I give my informed consent for the services to be provided through the use of telemedicine in my medical care  By participating in this telemedicine visit I agree to the above.

## 2019-04-13 NOTE — Patient Instructions (Addendum)
Medication Instructions:  Can we see if she would qualify for PCSK9 inhibitor Aortic atherosclerosis, PAD Diabetes, uncontrolled LDL 147  If qualified would need LFTs and lipids in 3 months time  Message sent to pharmacy team to review for PCSK9.  If you need a refill on your cardiac medications before your next appointment, please call your pharmacy.    Lab work: No new labs needed   If you have labs (blood work) drawn today and your tests are completely normal, you will receive your results only by: [name] MyChart Message (if you have MyChart) OR . A paper copy in the mail If you have any lab test that is abnormal or we need to change your treatment, we will call you to review the results.   Testing/Procedures: No new testing needed   Follow-Up: At [facility], you and your health needs are our priority.  As part of our continuing mission to provide you with exceptional heart care, we have created designated Provider Care Teams.  These Care Teams include your primary Cardiologist (physician) and Advanced Practice Providers (APPs -  Physician Assistants and Nurse Practitioners) who all work together to provide you with the care you need, when you need it.  . You will need a follow up appointment in 12 months .   Please call our office 2 months in advance to schedule this appointment.    . Providers on your designated Care Team:   . Nicolasa Ducking, NP . Eula Listen, PA-C . Marisue Ivan, PA-C  Any Other Special Instructions Will Be Listed Below (If Applicable).  For educational health videos Log in to : [email or web address] Or : [email or web address], password : [email or web address]

## 2019-04-13 NOTE — Progress Notes (Signed)
Virtual Visit via Video Note   This visit type was conducted due to national recommendations for restrictions regarding the COVID-19 Pandemic (e.g. social distancing) in an effort to limit this patient's exposure and mitigate transmission in our community.  Due to her co-morbid illnesses, this patient is at least at moderate risk for complications without adequate follow up.  This format is felt to be most appropriate for this patient at this time.  All issues noted in this document were discussed and addressed.  A limited physical exam was performed with this format.  Please refer to the patient's chart for her consent to telehealth for [facility].   I connected with  Debra [name] on 04/13/19 by a video enabled telemedicine application and verified that I am speaking with the correct person using two identifiers. I discussed the limitations of evaluation and management by telemedicine. The patient expressed understanding and agreed to proceed.   Evaluation Performed:  Follow-up visit  Date:  04/13/2019   ID:  Debra [name], DOB 11-06-1961, MRN [id number]  Patient Location:  [place] [address]   Provider location:   [facility], [place] office  PCP:  Arnetha Courser, MD  Cardiologist:  Arvid Right [facility]   Chief Complaint: Palpitations/fluttering    History of Present Illness:    Debra [name] is a 58 y.o. female who presents via audio/video conferencing for a telehealth visit today.   The patient does not symptoms concerning for COVID-19 infection (fever, chills, cough, or new SHORTNESS OF BREATH).   Patient has a past medical history of Smoker 10-15 Aortic athero on CT Poorly controlled diabetes Morbid obesity Hypertension Parkinson's with tremor Referred by Dr. Sanda Klein for consultation of her uncontrolled diabetes, atherosclerosis, chest heaviness  Reports that she was referred to our office based on symptoms of fluttering and some chest  heaviness that started after she increased her Actos When she went from 30 mg up to 45 mg she started having mild symptoms Dose was decreased back to 30 mg daily and she was started on Farxiga Has not had any further episodes since this medication change Active at baseline, denies any significant chest pain or shortness of breath on exertion  Long discussion concerning her diet Loves chocolate Likes sweets  Despite occasional cheating, reports that her Sugars going down into 70s sometimes even after dinner Was 130 before dinner and seemed to drop On Humalog supposed to take 35 units  Discussed previous attempts to take a statin Reports having awful side effects including passing out Even tried Zetia because dizziness and made her body hurt Does not want to try any statins or Zetia  We did review prior CT scan from March 2019 At least mild, some case mild to moderate calcification noted in the abdominal aorta and branches/iliac vessels  Lab work reviewed with her in detail HBA1C 8.1 Total choll 218 LDL 147   Prior CV studies:   The following studies were reviewed today:   Past Medical History:  Diagnosis Date  . Anxiety   . Arthritis   . C. difficile colitis   . C. difficile colitis   . Depression   . Diabetes mellitus without complication ([place])    Type II  . Diabetes mellitus, type II ([place])   . Family history of adverse reaction to anesthesia    Mother - BP drops  . Head injury, closed, with brief LOC ([place]) 2011ish  . Hyperlipidemia   .  Hypertension   . Insomnia   . Myalgia 03/29/2018   Due to statin  . Obesity   . Parkinson's disease ([place])   . Seasonal allergies   . Seizures ([place])   . Tobacco abuse    Past Surgical History:  Procedure Laterality Date  . ABDOMINAL HYSTERECTOMY  2012  . BREAST BIOPSY Right   . CESAREAN SECTION  1992  . CHOLECYSTECTOMY  2013  . LUMBAR LAMINECTOMY/DECOMPRESSION MICRODISCECTOMY Left 09/10/2015   Procedure: Left L4-5  Foraminotomy/Left L5-S1 Diskectomy;  Surgeon: Leeroy Cha, MD;  Location: [facility];  Service: Neurosurgery;  Laterality: Left;  Left L4-5 Foraminotomy/Left L5-S1 Diskectomy     Current Meds  Medication Sig  . aspirin EC 81 MG tablet Take 81 mg by mouth daily.  . blood glucose meter kit and supplies Use up to four times daily as directed, Dx E11.65, LON 99 months  . calcium citrate-vitamin D (CITRACAL+D) 315-200 MG-UNIT tablet Take 1 tablet by mouth 2 (two) times daily.  . Cholecalciferol 5000 units capsule Take 5,000 Units by mouth.   . Colesevelam HCl (WELCHOL) 3.75 g PACK Take 0.5 packets by mouth daily. Mixed in liquid  . dapagliflozin propanediol (FARXIGA) 10 MG TABS tablet Take 10 mg by mouth daily.  . folic acid (FOLVITE) 785 MCG tablet Take 800 mcg by mouth daily.   [name] glucose blood (ONE TOUCH ULTRA TEST) test strip USE UP TO FOUR TIMES A DAY AS DIRECTED; Dx 11.51, LON 99 months  . HUMALOG KWIKPEN 100 UNIT/ML KwikPen Give subcutaneously with meals, 20 units in the AM and 35 units with lunch and evening meal  . hydrochlorothiazide (HYDRODIURIL) 25 MG tablet TAKE 1 TABLET DAILY (DOSE INCREASE, REPLACES ALL OTHER HCTZ PRESCRIPTIONS)  . Insulin Pen Needle 29G X 12.7MM MISC 1 each by Does not apply route as needed.  . lamoTRIgine (LAMICTAL) 25 MG tablet Take 3 tablets (75 mg total) by mouth daily.  [name] lisinopril (PRINIVIL,ZESTRIL) 40 MG tablet Take 1 tablet (40 mg total) by mouth daily.  . Magnesium 400 MG CAPS Take 250 mg by mouth daily.   . Melatonin 10 MG TABS Take 1 tablet by mouth at bedtime.  . Multiple Vitamins-Minerals (CENTRUM WOMEN) TABS Take 1 tablet by mouth.  Glory Rosebush DELICA LANCETS 88F MISC Check fingerstick blood sugars four times a day; E11.65, LON 99 months  . PARoxetine (PAXIL) 40 MG tablet TAKE 1 TABLET EVERY MORNING (DOSE CHANGE)  . pioglitazone (ACTOS) 30 MG tablet Take 1 tablet (30 mg total) by mouth daily.  . propranolol ER (INDERAL LA) 80 MG 24 hr capsule TAKE  1 CAPSULE (80 MG TOTAL) BY MOUTH ONCE DAILY  . TRESIBA FLEXTOUCH 200 UNIT/ML SOPN Inject 120 Units into the skin at bedtime.     Allergies:   Clindamycin/lincomycin; Metformin and related; Benadryl [diphenhydramine hcl]; Canagliflozin; Depakote er [divalproex sodium er]; Dilaudid [hydromorphone hcl]; Diphenhydramine-zinc acetate; Erythromycin; Ethanol; Haloperidol; Hydromorphone; Neosporin [neomycin-bacitracin zn-polymyx]; Singulair [montelukast sodium]; Sitagliptin; Statins; Sulfa antibiotics; and Victoza [liraglutide]   Social History   Tobacco Use  . Smoking status: Current Every Day Smoker    Packs/day: 0.50    Years: 24.00    Pack years: 12.00    Types: Cigarettes    Start date: 03/19/2004  . Smokeless tobacco: Never Used  Substance Use Topics  . Alcohol use: Yes    Alcohol/week: 0.0 standard drinks    Comment: occasional- rare  . Drug use: No     Current Outpatient Medications on File Prior to Visit  Medication Sig Dispense Refill  . aspirin EC 81 MG tablet Take 81 mg by mouth daily.    . blood glucose meter kit and supplies Use up to four times daily as directed, Dx E11.65, LON 99 months 1 each 0  . calcium citrate-vitamin D (CITRACAL+D) 315-200 MG-UNIT tablet Take 1 tablet by mouth 2 (two) times daily.    . Cholecalciferol 5000 units capsule Take 5,000 Units by mouth.     . Colesevelam HCl (WELCHOL) 3.75 g PACK Take 0.5 packets by mouth daily. Mixed in liquid 30 packet 11  . dapagliflozin propanediol (FARXIGA) 10 MG TABS tablet Take 10 mg by mouth daily. 90 tablet 1  . folic acid (FOLVITE) 762 MCG tablet Take 800 mcg by mouth daily.     [name] glucose blood (ONE TOUCH ULTRA TEST) test strip USE UP TO FOUR TIMES A DAY AS DIRECTED; Dx 11.51, LON 99 months 300 each 3  . HUMALOG KWIKPEN 100 UNIT/ML KwikPen Give subcutaneously with meals, 20 units in the AM and 35 units with lunch and evening meal 27 pen 5  . hydrochlorothiazide (HYDRODIURIL) 25 MG tablet TAKE 1 TABLET DAILY (DOSE  INCREASE, REPLACES ALL OTHER HCTZ PRESCRIPTIONS) 90 tablet 1  . Insulin Pen Needle 29G X 12.7MM MISC 1 each by Does not apply route as needed. 100 each 3  . lamoTRIgine (LAMICTAL) 25 MG tablet Take 3 tablets (75 mg total) by mouth daily. 90 tablet 1  . lisinopril (PRINIVIL,ZESTRIL) 40 MG tablet Take 1 tablet (40 mg total) by mouth daily. 90 tablet 3  . Magnesium 400 MG CAPS Take 250 mg by mouth daily.     . Melatonin 10 MG TABS Take 1 tablet by mouth at bedtime.    . Multiple Vitamins-Minerals (CENTRUM WOMEN) TABS Take 1 tablet by mouth.    Glory Rosebush DELICA LANCETS 26J MISC Check fingerstick blood sugars four times a day; E11.65, LON 99 months 300 each 3  . PARoxetine (PAXIL) 40 MG tablet TAKE 1 TABLET EVERY MORNING (DOSE CHANGE) 90 tablet 1  . pioglitazone (ACTOS) 30 MG tablet Take 1 tablet (30 mg total) by mouth daily. 90 tablet 3  . propranolol ER (INDERAL LA) 80 MG 24 hr capsule TAKE 1 CAPSULE (80 MG TOTAL) BY MOUTH ONCE DAILY  3  . TRESIBA FLEXTOUCH 200 UNIT/ML SOPN Inject 120 Units into the skin at bedtime. 36 pen 5   No current facility-administered medications on file prior to visit.      Family Hx: The patient's family history includes Bipolar disorder in her brother, brother, and father; COPD in her mother; Diabetes Mellitus II in her brother and father.  ROS:   Please see the history of present illness.    Review of Systems  Constitutional: Negative.   Respiratory: Negative.   Cardiovascular: Positive for palpitations.  Gastrointestinal: Negative.   Musculoskeletal: Negative.   Neurological: Negative.   Psychiatric/Behavioral: Negative.   All other systems reviewed and are negative.     Labs/Other Tests and Data Reviewed:    Recent Labs: 08/05/2018: Hemoglobin 14.3; Platelets 266 08/19/2018: ALT 16; BUN 17; Creat 0.82; Magnesium 1.8; Potassium 4.2; Sodium 139 02/16/2019: TSH 1.671   Recent Lipid Panel Lab Results  Component Value Date/Time   CHOL 218 (H) 11/18/2018  09:05 AM   CHOL 228 (H) 06/14/2015 08:18 AM   TRIG 139 11/18/2018 09:05 AM   HDL 45 (L) 11/18/2018 09:05 AM   HDL 44 06/14/2015 08:18 AM   CHOLHDL 4.8 11/18/2018 09:05 AM  LDLCALC 147 (H) 11/18/2018 09:05 AM    Wt Readings from Last 3 Encounters:  03/20/19 275 lb (124.7 kg)  11/18/18 273 lb 1.6 oz (123.9 kg)  09/15/18 264 lb 12.8 oz (120.1 kg)     Exam:    Vital Signs: Vital signs may also be detailed in the HPI There were no vitals taken for this visit.  Wt Readings from Last 3 Encounters:  03/20/19 275 lb (124.7 kg)  11/18/18 273 lb 1.6 oz (123.9 kg)  09/15/18 264 lb 12.8 oz (120.1 kg)   Temp Readings from Last 3 Encounters:  03/20/19 (!) 97.5 F (36.4 C) (Oral)  11/18/18 98 F (36.7 C)  08/19/18 98.2 F (36.8 C) (Oral)   BP Readings from Last 3 Encounters:  03/20/19 125/70  11/18/18 128/76  09/15/18 117/73   Pulse Readings from Last 3 Encounters:  03/20/19 69  11/18/18 80  09/15/18 84     Well nourished, well developed female in no acute distress.  Minimal tremor noted Constitutional:  oriented to person, place, and time. No distress.  Head: Normocephalic and atraumatic.  Eyes:  no discharge. No scleral icterus.  Neck: Normal range of motion. Neck supple.  Pulmonary/Chest: No audible wheezing, no distress, appears comfortable Musculoskeletal: Normal range of motion.  no  tenderness or deformity.  Neurological:   Coordination normal. Full exam not performed Skin:  No rash Psychiatric:  normal mood and affect. behavior is normal. Thought content normal.    ASSESSMENT & PLAN:    Type 2 diabetes with decreased circulation ([place]) Long discussion with her concerning diet She had she reports sugars are now running low and she is trying to eat better Wonders if she can cut back on her insulin after the addition of Farxiga Will reach out to primary care  Abdominal aortic atherosclerosis ([place]) Images pulled up, for age 31 she does have mild, possibly mild to  moderate aortic atherosclerosis and distal aorta and iliac vessels Will work on cholesterol control as below Stressed importance of better diabetes control, weight loss Also she is smoking  Mixed hyperlipidemia We will try to get her approved for PCSK9 inhibitor given statin intolerance, unable to tolerate Zetia, known PAD, aortic atherosclerosis, poorly controlled diabetes, smoker LDL 140 Liver and lipids in 3 months time if approved  Essential hypertension Blood pressure is well controlled on today's visit. No changes made to the medications.  Palpitations Symptoms of palpitations seem to resolve after she decrease the dose of Actos back to 30 No further episodes She is on propranolol for tremor, also likely will help with any arrhythmia No further work-up at this time  Chest pain She actually denied having any chest pain with exertion Med had some brief tightness with higher dose Actos but this resolved with low-dose Actos Recommend she call us for any further episodes     COVID-19 Education: The signs and symptoms of COVID-19 were discussed with the patient and how to seek care for testing (follow up with PCP or arrange E-visit).  The importance of social distancing was discussed today.  Patient Risk:   After full review of this patients clinical status, I feel that they are at least moderate risk at this time.  Time:   Today, I have spent 45 minutes with the patient with telehealth technology discussing the cardiac and medical problems/diagnoses detailed above   10 min spent reviewing the chart prior to patient visit today   Medication Adjustments/Labs and Tests Ordered: Current medicines are reviewed at  length with the patient today.  Concerns regarding medicines are outlined above.   Tests Ordered: No tests ordered   Medication Changes: No changes made   Disposition: Follow-up 1 year   Signed, Ida Rogue, MD  04/13/2019 4:21 PM    [place] [facility] [address] [place] [id number], [place], [place] [address]

## 2019-04-17 ENCOUNTER — Telehealth: Payer: Self-pay | Admitting: Pharmacist Clinician (PhC)/ Clinical Pharmacy Specialist

## 2019-04-17 NOTE — Telephone Encounter (Signed)
LMOM for patient to call regarding discussion of options to lower cholesterol

## 2019-04-17 NOTE — Telephone Encounter (Signed)
Failed statins, ezetimibe.

## 2019-04-17 NOTE — Telephone Encounter (Signed)
Patient returned call to [facility].  We had a 14 minute conversation regarding options for her cholesterol, based on her history.    Labs from Dec 2019 show TC 218, TG 139, HDL 45, LDL 147.  Highest recorded LDL was from Nov 2018 at 172.    Patient has tried multiple statin drugs but unable to take due to side effects.  Patient reports that all statins she has tried have caused her to have syncopal episodes.  She tried atorvastatin, rosuvastatin and pitavastatin, all with the same results.  She then tried ezetimbie, however this caused muscle pains.  Her MD then gave her colesevelam powder, however she noted that this cased quite a lot of flatulence and she has had to discontinue use.    Spent some time explaining the options that are currently available.  Reviewed both Repatha and Nexletol, dosing, mechanism, side effects and benefit to LDL.  Patient questions were answered.  She is a diabetic, currently on both insulin and Triseba, so has no concerns about using injectable medications.  She has Nurse, learning disability and currently has no copay for any of her diabetic medications or testing supplies.    Will submit prior authorization to her insurer, once approved we can have her activate a copay card and send the prescription to her mail order pharmacy ([facility]).

## 2019-04-27 ENCOUNTER — Other Ambulatory Visit: Payer: Self-pay | Admitting: Psychiatry

## 2019-04-27 DIAGNOSIS — F3162 Bipolar disorder, current episode mixed, moderate: Secondary | ICD-10-CM

## 2019-05-22 ENCOUNTER — Other Ambulatory Visit: Payer: Self-pay | Admitting: Pharmacist Clinician (PhC)/ Clinical Pharmacy Specialist

## 2019-05-22 ENCOUNTER — Telehealth: Payer: Self-pay | Admitting: Pharmacist Clinician (PhC)/ Clinical Pharmacy Specialist

## 2019-05-22 MED ORDER — REPATHA SURECLICK 140 MG/ML ~~LOC~~ SOAJ: 140.0000 mg | SUBCUTANEOUS | 12 refills | Status: DC

## 2019-05-22 NOTE — Telephone Encounter (Signed)
Repatha authorized by insurance thru 6.21.21

## 2019-05-23 ENCOUNTER — Other Ambulatory Visit: Payer: Self-pay

## 2019-05-23 ENCOUNTER — Ambulatory Visit (INDEPENDENT_AMBULATORY_CARE_PROVIDER_SITE_OTHER): Payer: 59 | Admitting: Psychiatry

## 2019-05-23 ENCOUNTER — Encounter: Payer: Self-pay | Admitting: Psychiatry

## 2019-05-23 DIAGNOSIS — F4310 Post-traumatic stress disorder, unspecified: Secondary | ICD-10-CM

## 2019-05-23 DIAGNOSIS — E1151 Type 2 diabetes mellitus with diabetic peripheral angiopathy without gangrene: Secondary | ICD-10-CM

## 2019-05-23 DIAGNOSIS — F3162 Bipolar disorder, current episode mixed, moderate: Secondary | ICD-10-CM | POA: Diagnosis not present

## 2019-05-23 DIAGNOSIS — I70209 Unspecified atherosclerosis of native arteries of extremities, unspecified extremity: Secondary | ICD-10-CM

## 2019-05-23 DIAGNOSIS — E1165 Type 2 diabetes mellitus with hyperglycemia: Secondary | ICD-10-CM

## 2019-05-23 DIAGNOSIS — F17200 Nicotine dependence, unspecified, uncomplicated: Secondary | ICD-10-CM

## 2019-05-23 DIAGNOSIS — F5105 Insomnia due to other mental disorder: Secondary | ICD-10-CM | POA: Diagnosis not present

## 2019-05-23 MED ORDER — PAROXETINE HCL 40 MG PO TABS: ORAL_TABLET | ORAL | 1 refills | Status: DC

## 2019-05-23 MED ORDER — LAMOTRIGINE 25 MG PO TABS: 75.0000 mg | ORAL_TABLET | Freq: Every day | ORAL | 1 refills | Status: DC

## 2019-05-23 NOTE — Progress Notes (Signed)
Virtual Visit via Video Note  I connected with [name] on 05/23/19 at  3:15 PM EDT by a video enabled telemedicine application and verified that I am speaking with the correct person using two identifiers.   I discussed the limitations of evaluation and management by telemedicine and the availability of in person appointments. The patient expressed understanding and agreed to proceed.    I discussed the assessment and treatment plan with the patient. The patient was provided an opportunity to ask questions and all were answered. The patient agreed with the plan and demonstrated an understanding of the instructions.   The patient was advised to call back or seek an in-person evaluation if the symptoms worsen or if the condition fails to improve as anticipated.   [facility] [facility] MD OP Progress Note  05/23/2019 5:22 PM BRENT NOTO  MRN:  [id number]  Chief Complaint:  Chief Complaint    Follow-up     HPI: Kelaiah is a 58 year old Caucasian female, married, on disability, lives in [place], has a history of bipolar disorder, PTSD, tobacco use disorder, parkinsonian tremors, chronic back pain, OSA, hypertension, possible REM sleep behavior disorder, was evaluated by telemedicine today.  Patient reports she is coping with the COVID-19 stressors.  She reports she and her family continue to stay safe.  She denies any significant mood lability or anxiety symptoms at this time.  She reports she has been taking the lamotrigine as well as the Paxil which has been helpful.  She denies any side effects to the lamotrigine which was recently started.  Patient reports sleep is good.  She reports she continues to use CPAP which has been helpful.  He also uses melatonin.  She reports she was seen by neurology recently.  She is currently on propranolol and clonazepam.  The medications were added for her tremors.  She will continue to work with neurology on the same.  Patient reports she had an appointment with Merleen Nicely  and is waiting to hear back from her for another one.  Patient denies any suicidality, homicidality or perceptual disturbances.  She continues to have good support system from her husband. Visit Diagnosis:    ICD-10-CM   1. Bipolar 1 disorder, mixed, moderate (HCC)  F31.62 PARoxetine (PAXIL) 40 MG tablet    lamoTRIgine (LAMICTAL) 25 MG tablet  2. PTSD (post-traumatic stress disorder)  F43.10   3. Tobacco use disorder  F17.200   4. Insomnia due to mental condition  F51.05     Past Psychiatric History: I have reviewed past psychiatric history from my progress note on 02/16/2019.  Past trials of Paxil, Depakote, Haldol, trazodone, melatonin.  Past Medical History:  Past Medical History:  Diagnosis Date  . Anxiety   . Arthritis   . C. difficile colitis   . C. difficile colitis   . Depression   . Diabetes mellitus without complication ([place])    Type II  . Diabetes mellitus, type II ([place])   . Family history of adverse reaction to anesthesia    Mother - BP drops  . Head injury, closed, with brief LOC ([place]) 2011ish  . Hyperlipidemia   . Hypertension   . Insomnia   . Myalgia 03/29/2018   Due to statin  . Obesity   . Parkinson's disease ([place])   . Seasonal allergies   . Seizures ([place])   . Tobacco abuse     Past Surgical History:  Procedure Laterality Date  . ABDOMINAL HYSTERECTOMY  2012  . BREAST BIOPSY Right   .  CESAREAN SECTION  1992  . CHOLECYSTECTOMY  2013  . LUMBAR LAMINECTOMY/DECOMPRESSION MICRODISCECTOMY Left 09/10/2015   Procedure: Left L4-5 Foraminotomy/Left L5-S1 Diskectomy;  Surgeon: Leeroy Cha, MD;  Location: [facility];  Service: Neurosurgery;  Laterality: Left;  Left L4-5 Foraminotomy/Left L5-S1 Diskectomy    Family Psychiatric History: I have reviewed family psychiatric history from my progress note on 02/16/2019.  Family History:  Family History  Problem Relation Age of Onset  . COPD Mother   . Bipolar disorder Father   . Diabetes Mellitus II Father    . Bipolar disorder Brother   . Diabetes Mellitus II Brother   . Bipolar disorder Brother     Social History: I have reviewed social history from my progress note on 02/16/2019. Social History   Socioeconomic History  . Marital status: Married    Spouse name: [name]  . Number of children: 1  . Years of education: Not on file  . Highest education level: Associate degree: occupational, Hotel manager, or vocational program  Occupational History  . Occupation: Disabled  Social Needs  . Financial resource strain: Somewhat hard  . Food insecurity    Worry: Often true    Inability: Often true  . Transportation needs    Medical: No    Non-medical: No  Tobacco Use  . Smoking status: Current Every Day Smoker    Packs/day: 0.50    Years: 24.00    Pack years: 12.00    Types: Cigarettes    Start date: 03/19/2004  . Smokeless tobacco: Never Used  Substance and Sexual Activity  . Alcohol use: Yes    Alcohol/week: 0.0 standard drinks    Comment: occasional- rare  . Drug use: No  . Sexual activity: Not Currently  Lifestyle  . Physical activity    Days per week: 0 days    Minutes per session: 0 min  . Stress: Only a little  Relationships  . Social Herbalist on phone: Twice a week    Gets together: Twice a week    Attends religious service: Never    Active member of club or organization: No    Attends meetings of clubs or organizations: Never    Relationship status: Married  Other Topics Concern  . Not on file  Social History Narrative   Live in private residence w/ husband (apartment complex)    Allergies:  Allergies  Allergen Reactions  . Clindamycin/Lincomycin     Pt states caused C-Diff  . Metformin And Related Diarrhea  . Benadryl [Diphenhydramine Hcl] Swelling    blistering  . Canagliflozin Other (See Comments)    Other reaction(s): Other (See Comments) Low bp Low bp Low bp  . Depakote Er [Divalproex Sodium Er] Other (See Comments)    seizures  .  Dilaudid [Hydromorphone Hcl] Other (See Comments)    seizures  . Diphenhydramine-Zinc Acetate Other (See Comments)  . Erythromycin   . Ethanol   . Haloperidol Other (See Comments)  . Hydromorphone Other (See Comments)  . Neosporin [Neomycin-Bacitracin Zn-Polymyx] Swelling    blistering  . Singulair [Montelukast Sodium] Other (See Comments)    Asthma attacks  . Sitagliptin Other (See Comments)  . Statins Other (See Comments)    Other reaction(s): Unknown  . Sulfa Antibiotics Hives    Other reaction(s): Unknown  . Victoza [Liraglutide] Other (See Comments) and Nausea And Vomiting    Other reaction(s): Other (See Comments) pancreatitis pancreatitis    Metabolic Disorder Labs: Lab Results  Component Value Date  HGBA1C 8.1 (H) 11/18/2018   MPG 186 11/18/2018   MPG 186 08/19/2018   No results found for: PROLACTIN Lab Results  Component Value Date   CHOL 218 (H) 11/18/2018   TRIG 139 11/18/2018   HDL 45 (L) 11/18/2018   CHOLHDL 4.8 11/18/2018   LDLCALC 147 (H) 11/18/2018   LDLCALC 150 (H) 08/19/2018   Lab Results  Component Value Date   TSH 1.671 02/16/2019   TSH 2.160 08/20/2015    Therapeutic Level Labs: No results found for: LITHIUM No results found for: VALPROATE No components found for:  CBMZ  Current Medications: Current Outpatient Medications  Medication Sig Dispense Refill  . clonazePAM (KLONOPIN) 0.5 MG tablet Take by mouth.    [name] aspirin EC 81 MG tablet Take 81 mg by mouth daily.    . blood glucose meter kit and supplies Use up to four times daily as directed, Dx E11.65, LON 99 months 1 each 0  . calcium citrate-vitamin D (CITRACAL+D) 315-200 MG-UNIT tablet Take 1 tablet by mouth 2 (two) times daily.    . Cholecalciferol 5000 units capsule Take 5,000 Units by mouth.     . Colesevelam HCl (WELCHOL) 3.75 g PACK Take 0.5 packets by mouth daily. Mixed in liquid 30 packet 11  . dapagliflozin propanediol (FARXIGA) 10 MG TABS tablet Take 10 mg by mouth daily. 90  tablet 1  . Evolocumab (REPATHA SURECLICK) 597 MG/ML SOAJ Inject 140 mg into the skin every 14 (fourteen) days. 2 pen 12  . folic acid (FOLVITE) 416 MCG tablet Take 800 mcg by mouth daily.     [name] glucose blood (ONE TOUCH ULTRA TEST) test strip USE UP TO FOUR TIMES A DAY AS DIRECTED; Dx 11.51, LON 99 months 300 each 3  . HUMALOG KWIKPEN 100 UNIT/ML KwikPen Give subcutaneously with meals, 20 units in the AM and 35 units with lunch and evening meal 27 pen 5  . hydrochlorothiazide (HYDRODIURIL) 25 MG tablet TAKE 1 TABLET DAILY (DOSE INCREASE, REPLACES ALL OTHER HCTZ PRESCRIPTIONS) 90 tablet 1  . Insulin Pen Needle 29G X 12.7MM MISC 1 each by Does not apply route as needed. 100 each 3  . lamoTRIgine (LAMICTAL) 25 MG tablet Take 3 tablets (75 mg total) by mouth daily. 270 tablet 1  . lisinopril (PRINIVIL,ZESTRIL) 40 MG tablet Take 1 tablet (40 mg total) by mouth daily. 90 tablet 3  . Magnesium 400 MG CAPS Take 250 mg by mouth daily.     . Melatonin 10 MG TABS Take 1 tablet by mouth at bedtime.    . Multiple Vitamins-Minerals (CENTRUM WOMEN) TABS Take 1 tablet by mouth.    Glory Rosebush DELICA LANCETS 38G MISC Check fingerstick blood sugars four times a day; E11.65, LON 99 months 300 each 3  . PARoxetine (PAXIL) 40 MG tablet TAKE 1 TABLET EVERY MORNING 90 tablet 1  . pioglitazone (ACTOS) 30 MG tablet Take 1 tablet (30 mg total) by mouth daily. 90 tablet 3  . propranolol ER (INDERAL LA) 80 MG 24 hr capsule TAKE 1 CAPSULE (80 MG TOTAL) BY MOUTH ONCE DAILY  3  . TRESIBA FLEXTOUCH 200 UNIT/ML SOPN Inject 120 Units into the skin at bedtime. 36 pen 5   No current facility-administered medications for this visit.      Musculoskeletal: Strength & Muscle Tone: within normal limits Gait & Station: normal Patient leans: N/A  Psychiatric Specialty Exam: Review of Systems  Psychiatric/Behavioral: The patient is not nervous/anxious.   All other systems reviewed and are  negative.   There were no vitals taken  for this visit.There is no height or weight on file to calculate BMI.  General Appearance: Casual  Eye Contact:  Fair  Speech:  Clear and Coherent  Volume:  Normal  Mood:  Euthymic  Affect:  Congruent  Thought Process:  Goal Directed and Descriptions of Associations: Intact  Orientation:  Full (Time, Place, and Person)  Thought Content: Logical   Suicidal Thoughts:  No  Homicidal Thoughts:  No  Memory:  Immediate;   Fair Recent;   Fair Remote;   Fair  Judgement:  Fair  Insight:  Fair  Psychomotor Activity:  Normal  Concentration:  Concentration: Fair and Attention Span: Fair  Recall:  [employer] of Knowledge: Fair  Language: Fair  Akathisia:  No  Handed:  Right  AIMS (if indicated): UTA  Assets:  Communication Skills Desire for Improvement Social Support  ADL's:  Intact  Cognition: WNL  Sleep:  Fair   Screenings: Mini-Mental     [facility] Visit from 09/21/2016 in [facility]  Total Score (max 30 points )  27    PHQ2-9     Office Visit from 03/20/2019 in [facility] Office Visit from 11/18/2018 in [facility] Office Visit from 08/19/2018 in [facility] Office Visit from 08/17/2018 in [facility] Office Visit from 05/18/2018 in [facility]  PHQ-2 Total Score  0  2  3  0  2  PHQ-9 Total Score  0  8  14  -  11       Assessment and Plan: Cyndie is a 58 year old Caucasian female, married, on disability, lives in [place], has a history of bipolar disorder, PTSD, hypertension, diabetes melitis, OSA on CPAP, chronic pain was evaluated by telemedicine today.  Patient is biologically predisposed given her history of trauma, family history of mental health problems.  Patient continues to have psychosocial stressors of coronavirus outbreak however is making progress.  She will continue medications as well as psychotherapy sessions.  Plan as noted  below.  Plan Bipolar disorder-improving Lamotrigine 75 mg p.o. daily Paxil 40 mg p.o. daily.  For PTSD-improving Paxil as prescribed. Continue psychotherapy sessions.  For insomnia-improving Melatonin as prescribed by neurology. Continue CPAP for OSA.  For tobacco use disorder-unstable Provided smoking cessation counseling.  Follow-up in clinic in 2 to 3 months or sooner if needed.  September 23 at 2 PM  I have spent atleast 15 minutes non face to face with patient today. More than 50 % of the time was spent for psychoeducation and supportive psychotherapy and care coordination.  This note was generated in part or whole with voice recognition software. Voice recognition is usually quite accurate but there are transcription errors that can and very often do occur. I apologize for any typographical errors that were not detected and corrected.        Ursula Alert, MD 05/23/2019, 5:22 PM

## 2019-05-24 ENCOUNTER — Encounter: Payer: Self-pay | Admitting: Nurse Practitioner

## 2019-05-24 ENCOUNTER — Ambulatory Visit (INDEPENDENT_AMBULATORY_CARE_PROVIDER_SITE_OTHER): Payer: Managed Care, Other (non HMO) | Admitting: Nurse Practitioner

## 2019-05-24 VITALS — BP 137/87 | HR 74 | Temp 96.6°F | Ht 65.25 in | Wt 278.0 lb

## 2019-05-24 DIAGNOSIS — J0141 Acute recurrent pansinusitis: Secondary | ICD-10-CM | POA: Diagnosis not present

## 2019-05-24 DIAGNOSIS — IMO0002 Reserved for concepts with insufficient information to code with codable children: Secondary | ICD-10-CM

## 2019-05-24 DIAGNOSIS — E1159 Type 2 diabetes mellitus with other circulatory complications: Secondary | ICD-10-CM | POA: Diagnosis not present

## 2019-05-24 DIAGNOSIS — B373 Candidiasis of vulva and vagina: Secondary | ICD-10-CM | POA: Diagnosis not present

## 2019-05-24 DIAGNOSIS — B3731 Acute candidiasis of vulva and vagina: Secondary | ICD-10-CM

## 2019-05-24 DIAGNOSIS — E1165 Type 2 diabetes mellitus with hyperglycemia: Secondary | ICD-10-CM | POA: Diagnosis not present

## 2019-05-24 MED ORDER — FLUCONAZOLE 150 MG PO TABS: 150.0000 mg | ORAL_TABLET | Freq: Once | ORAL | 0 refills | Status: AC

## 2019-05-24 MED ORDER — AMOXICILLIN-POT CLAVULANATE 875-125 MG PO TABS: 1.0000 | ORAL_TABLET | Freq: Two times a day (BID) | ORAL | 0 refills | Status: DC

## 2019-05-24 NOTE — Progress Notes (Signed)
Virtual Visit via Video Note  I connected with Debra [name] on 05/24/19 at 10:00 AM EDT by a video enabled telemedicine application and verified that I am speaking with the correct person using two identifiers.   Staff discussed the limitations of evaluation and management by telemedicine and the availability of in person appointments. The patient expressed understanding and agreed to proceed.  Patient location: home  My location: work office Other people present:  none HPI Patient presents with  nasal congestion, purulent nasal discharge, maxillary tooth discomfort, and facial pain or pressure that is worse or localized to the sinuses when bending forward. Patient endorses left ear fullness. Patient  denies fever, fatigue, shortness of breath.  Symptoms have been ongoing intermittently for the past 2 months. Pt does endorse biphasic patten  She has tried mucinex, flonase, robitussen.   Patient endorses vaginal itching started a few weeks. Patient endorses white thick discharge without obvious odor. States she frequently gets yeast infections.   Diabetes She sees Dr. Gabriel Carina.  Patients fasting blood sugars in the past have between 157-192 She reports taking her medications as prescribed.  Lab Results  Component Value Date   HGBA1C 8.1 (H) 11/18/2018    PHQ2/9: Depression screen [facility] 2/9 05/24/2019 03/20/2019 11/18/2018 08/19/2018 08/17/2018  Decreased Interest 0 0 1 0 0  Down, Depressed, Hopeless 0 0 1 3 0  PHQ - 2 Score 0 0 2 3 0  Altered sleeping 0 0 2 3 -  Tired, decreased energy 0 0 2 3 -  Change in appetite 0 0 1 3 -  Feeling bad or failure about yourself  0 0 0 1 -  Trouble concentrating 0 0 1 1 -  Moving slowly or fidgety/restless 0 0 0 0 -  Suicidal thoughts 0 0 0 0 -  PHQ-9 Score 0 0 8 14 -  Difficult doing work/chores Not difficult at all Not difficult at all Somewhat difficult Not difficult at all -  Some recent data might be hidden     PHQ reviewed. Negative  Patient  Active Problem List   Diagnosis Date Noted  . Palpitations 04/13/2019  . Obstructive sleep apnea 11/18/2018  . Parkinsonian tremor ([place]) 11/18/2018  . RBD (REM behavioral disorder) 05/19/2018  . C. difficile colitis 05/12/2018  . Morbid obesity ([place]) 05/03/2018  . Myalgia 03/29/2018  . Osteopenia 03/16/2018  . Type 2 diabetes with decreased circulation ([place]) 03/04/2018  . Abdominal aortic atherosclerosis ([place]) 03/04/2018  . Adverse reaction to statin medication 03/04/2018  . Uncontrolled diabetes mellitus type 2 with atherosclerosis of arteries of extremities ([place]) 03/04/2018  . Hyperlipidemia 04/20/2017  . Acute low back pain with radicular symptoms, duration less than 6 weeks 08/28/2015  . Lumbar disc disease with radiculopathy 08/20/2015  . Lumbar herniated disc 08/20/2015  . Uncontrolled diabetes mellitus ([place]) 08/20/2015  . Left-sided low back pain with sciatica 06/03/2015  . Bipolar disorder ([place]) 06/03/2015  . Insomnia, persistent 06/03/2015  . BP (high blood pressure) 06/03/2015  . Leg swelling 06/03/2015  . Compulsive tobacco user syndrome 06/03/2015  . Phlebectasia 06/03/2015    Past Medical History:  Diagnosis Date  . Anxiety   . Arthritis   . C. difficile colitis   . C. difficile colitis   . Depression   . Diabetes mellitus without complication ([place])    Type II  . Diabetes mellitus, type II ([place])   . Family history of adverse reaction to anesthesia    Mother - BP drops  . Head injury,  closed, with brief LOC ([place]) 2011ish  . Hyperlipidemia   . Hypertension   . Insomnia   . Myalgia 03/29/2018   Due to statin  . Obesity   . Parkinson's disease ([place])   . Seasonal allergies   . Seizures ([place])   . Tobacco abuse     Past Surgical History:  Procedure Laterality Date  . ABDOMINAL HYSTERECTOMY  2012  . BREAST BIOPSY Right   . CESAREAN SECTION  1992  . CHOLECYSTECTOMY  2013  . LUMBAR LAMINECTOMY/DECOMPRESSION MICRODISCECTOMY Left 09/10/2015   Procedure: Left  L4-5 Foraminotomy/Left L5-S1 Diskectomy;  Surgeon: Leeroy Cha, MD;  Location: [facility];  Service: Neurosurgery;  Laterality: Left;  Left L4-5 Foraminotomy/Left L5-S1 Diskectomy    Social History   Tobacco Use  . Smoking status: Current Every Day Smoker    Packs/day: 0.50    Years: 24.00    Pack years: 12.00    Types: Cigarettes    Start date: 03/19/2004  . Smokeless tobacco: Never Used  Substance Use Topics  . Alcohol use: Yes    Alcohol/week: 0.0 standard drinks    Comment: occasional- rare     Current Outpatient Medications:  .  aspirin EC 81 MG tablet, Take 81 mg by mouth daily., Disp: , Rfl:  .  calcium citrate-vitamin D (CITRACAL+D) 315-200 MG-UNIT tablet, Take 1 tablet by mouth 2 (two) times daily., Disp: , Rfl:  .  Cholecalciferol 5000 units capsule, Take 5,000 Units by mouth. , Disp: , Rfl:  .  clonazePAM (KLONOPIN) 0.5 MG tablet, Take by mouth., Disp: , Rfl:  .  dapagliflozin propanediol (FARXIGA) 10 MG TABS tablet, Take 10 mg by mouth daily., Disp: 90 tablet, Rfl: 1 .  Evolocumab (REPATHA SURECLICK) 465 MG/ML SOAJ, Inject 140 mg into the skin every 14 (fourteen) days., Disp: 2 pen, Rfl: 12 .  folic acid (FOLVITE) 035 MCG tablet, Take 800 mcg by mouth daily. , Disp: , Rfl:  .  HUMALOG KWIKPEN 100 UNIT/ML KwikPen, Give subcutaneously with meals, 20 units in the AM and 35 units with lunch and evening meal, Disp: 27 pen, Rfl: 5 .  hydrochlorothiazide (HYDRODIURIL) 25 MG tablet, TAKE 1 TABLET DAILY (DOSE INCREASE, REPLACES ALL OTHER HCTZ PRESCRIPTIONS), Disp: 90 tablet, Rfl: 1 .  lamoTRIgine (LAMICTAL) 25 MG tablet, Take 3 tablets (75 mg total) by mouth daily., Disp: 270 tablet, Rfl: 1 .  lisinopril (PRINIVIL,ZESTRIL) 40 MG tablet, Take 1 tablet (40 mg total) by mouth daily., Disp: 90 tablet, Rfl: 3 .  Magnesium 400 MG CAPS, Take 250 mg by mouth daily. , Disp: , Rfl:  .  Melatonin 10 MG TABS, Take 1 tablet by mouth at bedtime., Disp: , Rfl:  .  Multiple Vitamins-Minerals  (CENTRUM WOMEN) TABS, Take 1 tablet by mouth., Disp: , Rfl:  .  PARoxetine (PAXIL) 40 MG tablet, TAKE 1 TABLET EVERY MORNING, Disp: 90 tablet, Rfl: 1 .  pioglitazone (ACTOS) 30 MG tablet, Take 1 tablet (30 mg total) by mouth daily., Disp: 90 tablet, Rfl: 3 .  propranolol ER (INDERAL LA) 80 MG 24 hr capsule, TAKE 1 CAPSULE (80 MG TOTAL) BY MOUTH ONCE DAILY, Disp: , Rfl: 3 .  TRESIBA FLEXTOUCH 200 UNIT/ML SOPN, Inject 120 Units into the skin at bedtime., Disp: 36 pen, Rfl: 5 .  blood glucose meter kit and supplies, Use up to four times daily as directed, Dx E11.65, LON 99 months, Disp: 1 each, Rfl: 0 .  Colesevelam HCl (WELCHOL) 3.75 g PACK, Take 0.5 packets by  mouth daily. Mixed in liquid (Patient not taking: Reported on 05/24/2019), Disp: 30 packet, Rfl: 11 .  glucose blood (ONE TOUCH ULTRA TEST) test strip, USE UP TO FOUR TIMES A DAY AS DIRECTED; Dx 11.51, LON 99 months, Disp: 300 each, Rfl: 3 .  Insulin Pen Needle 29G X 12.7MM MISC, 1 each by Does not apply route as needed., Disp: 100 each, Rfl: 3 .  ONETOUCH DELICA LANCETS 18A MISC, Check fingerstick blood sugars four times a day; E11.65, LON 99 months, Disp: 300 each, Rfl: 3  Allergies  Allergen Reactions  . Clindamycin/Lincomycin     Pt states caused C-Diff  . Metformin And Related Diarrhea  . Benadryl [Diphenhydramine Hcl] Swelling    blistering  . Canagliflozin Other (See Comments)    Other reaction(s): Other (See Comments) Low bp Low bp Low bp  . Depakote Er [Divalproex Sodium Er] Other (See Comments)    seizures  . Dilaudid [Hydromorphone Hcl] Other (See Comments)    seizures  . Diphenhydramine-Zinc Acetate Other (See Comments)  . Erythromycin   . Ethanol   . Haloperidol Other (See Comments)  . Hydromorphone Other (See Comments)  . Neosporin [Neomycin-Bacitracin Zn-Polymyx] Swelling    blistering  . Singulair [Montelukast Sodium] Other (See Comments)    Asthma attacks  . Sitagliptin Other (See Comments)  . Statins Other  (See Comments)    Other reaction(s): Unknown  . Sulfa Antibiotics Hives    Other reaction(s): Unknown  . Victoza [Liraglutide] Other (See Comments) and Nausea And Vomiting    Other reaction(s): Other (See Comments) pancreatitis pancreatitis    ROS   No other specific complaints in a complete review of systems (except as listed in HPI above).  Objective  Vitals:   05/24/19 0952  BP: 137/87  Pulse: 74  Temp: (!) 96.6 F (35.9 C)  TempSrc: Temporal  Weight: 278 lb (126.1 kg)  Height: 5' 5.25" (1.657 m)     Body mass index is 45.91 kg/m.  Nursing Note and Vital Signs reviewed.  Physical Exam   Constitutional: Patient appears well-developed and well-nourished. No distress.  HENT: Head: Normocephalic and atraumatic. Bilateral maxillary and frontal sinus tenderness Cardiovascular: Normal rate Pulmonary/Chest: Effort normal  Musculoskeletal: Normal range of motion,  Neurological: alert and oriented, speech normal.  Skin: No rash noted. No erythema.  Psychiatric: Patient has a normal mood and affect. behavior is normal. Judgment and thought content normal.    Assessment & Plan  1. Acute recurrent pansinusitis Discussed OTC treatments  - amoxicillin-clavulanate (AUGMENTIN) 875-125 MG tablet; Take 1 tablet by mouth 2 (two) times daily.  Dispense: 20 tablet; Refill: 0  2. Vaginal yeast infection Probiotic, needs better diabetes control  - fluconazole (DIFLUCAN) 150 MG tablet; Take 1 tablet (150 mg total) by mouth once for 1 dose.  Dispense: 1 tablet; Refill: 0  3. Uncontrolled diabetes mellitus with circulatory complication ([place]) Requested her to follow-up with endo via virtual visit as she cancelled due to pandemic, consider going up on tresiba; discussed sick day care   Follow Up Instructions:    I discussed the assessment and treatment plan with the patient. The patient was provided an opportunity to ask questions and all were answered. The patient agreed with  the plan and demonstrated an understanding of the instructions.   The patient was advised to call back or seek an in-person evaluation if the symptoms worsen or if the condition fails to improve as anticipated.  I provided 21 minutes of non-face-to-face time during this  encounter.   Fredderick Severance, NP

## 2019-05-25 ENCOUNTER — Other Ambulatory Visit: Payer: Self-pay | Admitting: Psychiatry

## 2019-05-25 DIAGNOSIS — F3162 Bipolar disorder, current episode mixed, moderate: Secondary | ICD-10-CM

## 2019-06-04 ENCOUNTER — Other Ambulatory Visit: Payer: Self-pay | Admitting: Family Medicine

## 2019-06-13 ENCOUNTER — Encounter: Payer: Self-pay | Admitting: Nurse Practitioner

## 2019-06-13 ENCOUNTER — Other Ambulatory Visit: Payer: Self-pay

## 2019-06-13 ENCOUNTER — Ambulatory Visit (INDEPENDENT_AMBULATORY_CARE_PROVIDER_SITE_OTHER): Payer: Managed Care, Other (non HMO) | Admitting: Nurse Practitioner

## 2019-06-13 ENCOUNTER — Ambulatory Visit (INDEPENDENT_AMBULATORY_CARE_PROVIDER_SITE_OTHER): Payer: Managed Care, Other (non HMO)

## 2019-06-13 VITALS — BP 137/87 | HR 74 | Temp 96.6°F | Ht 65.0 in | Wt 278.0 lb

## 2019-06-13 VITALS — BP 137/87 | HR 74 | Temp 96.6°F | Wt 278.0 lb

## 2019-06-13 DIAGNOSIS — Z1231 Encounter for screening mammogram for malignant neoplasm of breast: Secondary | ICD-10-CM

## 2019-06-13 DIAGNOSIS — G4733 Obstructive sleep apnea (adult) (pediatric): Secondary | ICD-10-CM | POA: Diagnosis not present

## 2019-06-13 DIAGNOSIS — I70209 Unspecified atherosclerosis of native arteries of extremities, unspecified extremity: Secondary | ICD-10-CM

## 2019-06-13 DIAGNOSIS — Z0000 Encounter for general adult medical examination without abnormal findings: Secondary | ICD-10-CM | POA: Diagnosis not present

## 2019-06-13 DIAGNOSIS — I700 Atherosclerosis of aorta: Secondary | ICD-10-CM

## 2019-06-13 DIAGNOSIS — J3089 Other allergic rhinitis: Secondary | ICD-10-CM

## 2019-06-13 DIAGNOSIS — F3178 Bipolar disorder, in full remission, most recent episode mixed: Secondary | ICD-10-CM | POA: Diagnosis not present

## 2019-06-13 DIAGNOSIS — M79671 Pain in right foot: Secondary | ICD-10-CM

## 2019-06-13 DIAGNOSIS — IMO0002 Reserved for concepts with insufficient information to code with codable children: Secondary | ICD-10-CM

## 2019-06-13 DIAGNOSIS — M79672 Pain in left foot: Secondary | ICD-10-CM

## 2019-06-13 DIAGNOSIS — E1151 Type 2 diabetes mellitus with diabetic peripheral angiopathy without gangrene: Secondary | ICD-10-CM | POA: Diagnosis not present

## 2019-06-13 DIAGNOSIS — E1165 Type 2 diabetes mellitus with hyperglycemia: Secondary | ICD-10-CM

## 2019-06-13 DIAGNOSIS — G20 Parkinson's disease: Secondary | ICD-10-CM

## 2019-06-13 DIAGNOSIS — Z5181 Encounter for therapeutic drug level monitoring: Secondary | ICD-10-CM

## 2019-06-13 MED ORDER — LORATADINE 10 MG PO TABS: 10.0000 mg | ORAL_TABLET | Freq: Every day | ORAL | 0 refills | Status: DC | PRN

## 2019-06-13 NOTE — Progress Notes (Signed)
Virtual Visit via Video Note  I connected with Debra [name] on 06/13/19 at  4:00 PM EDT by a video enabled telemedicine application and verified that I am speaking with the correct person using two identifiers.   Staff discussed the limitations of evaluation and management by telemedicine and the availability of in person appointments. The patient expressed understanding and agreed to proceed.  Patient location: home  My location: work office Other people present: none  HPI  Diabetes Mellitus Patient is rx actos 20m daily, farxiga 132mdaily, tresiba 120 units at bedtime and Humalog 20 units with morning meal and 35 units at evening meal. Takes medications as prescribed with no missed doses a month.  Diet: states diet varies- has had some pasta, hotdogs and watermelon recently. Has been trying to be more mindful about carb intake.  Checks her blood sugars 4 times a day.  Fasting blood sugar 127-180. Before meals- 70-200 Bedtime- 150-215.  Denies polyphagia, polydipsia, polyuria.  She is statin intolerant, is on ACE inhibitor. Needs eye doctor appointment- closed due to pandemic. Endorses bilateral foot pain and is requesting diabetic shoes.  Lab Results  Component Value Date   HGBA1C 8.1 (H) 11/18/2018  Smoking 0.5-0.75 ppd, willing to cut down a bit but not willing to quit, states she plans to quit after corona.   Hypertension Patient is on HCTZ 2555maily, lisinopril 19m68mily, propanolol 80mg61mly.  Takes medications as prescribed with no missed doses a month.  She is somewhat compliant with low-salt diet.  Denies chest pain, headaches, blurry vision. Propanolol is ordered by neurologist for parkinsonian tremors- states recently got worse, televisit with neuro increased dose to 100mg 61mhas not started this yet.   Hyperlipidemia Patient rx  repatha has taken two doses- and has not had any issues. Follows up with cardiology- Dr. GollanRockey Situ statin intolerance. Has aortic  atherosclerosis  Lab Results  Component Value Date   CHOL 218 (H) 11/18/2018   HDL 45 (L) 11/18/2018   LDLCALC 147 (H) 11/18/2018   TRIG 139 11/18/2018   CHOLHDL 4.8 11/18/2018   Osteopenia Patient is taking vitamin D and calcium supplementation. Constantly walking around her house, unable to walk outside due to chronic pain.   Bipolar disorder Follows up with psychiatry- Dr. EappenShea Evansrrently taking lamictal, pavil and klonopin PRN     PHQ2/9: Depression screen PHQ 2/[facility]/06/2019 05/24/2019 03/20/2019 11/18/2018 08/19/2018  Decreased Interest 0 0 0 1 0  Down, Depressed, Hopeless 0 0 0 1 3  PHQ - 2 Score 0 0 0 2 3  Altered sleeping 0 0 0 2 3  Tired, decreased energy 0 0 0 2 3  Change in appetite 0 0 0 1 3  Feeling bad or failure about yourself  0 0 0 0 1  Trouble concentrating 0 0 0 1 1  Moving slowly or fidgety/restless 0 0 0 0 0  Suicidal thoughts 0 0 0 0 0  PHQ-9 Score 0 0 0 8 14  Difficult doing work/chores Not difficult at all Not difficult at all Not difficult at all Somewhat difficult Not difficult at all  Some recent data might be hidden     PHQ reviewed. Negative  Patient Active Problem List   Diagnosis Date Noted  . Obstructive sleep apnea 11/18/2018  . Parkinsonian tremor (HCC) 1[place]3/2019  . RBD (REM behavioral disorder) 05/19/2018  . Morbid obesity (HCC) 0[place]8/2019  . Osteopenia 03/16/2018  . Abdominal aortic atherosclerosis (HCC) 0[place]9/2019  . Adverse  reaction to statin medication 03/04/2018  . Uncontrolled diabetes mellitus type 2 with atherosclerosis of arteries of extremities ([place]) 03/04/2018  . Hyperlipidemia 04/20/2017  . Lumbar disc disease with radiculopathy 08/20/2015  . Lumbar herniated disc 08/20/2015  . Left-sided low back pain with sciatica 06/03/2015  . Bipolar disorder ([place]) 06/03/2015  . Insomnia, persistent 06/03/2015  . BP (high blood pressure) 06/03/2015  . Compulsive tobacco user syndrome 06/03/2015  . Phlebectasia 06/03/2015     Past Medical History:  Diagnosis Date  . Anxiety   . Arthritis   . C. difficile colitis   . C. difficile colitis   . Depression   . Diabetes mellitus without complication ([place])    Type II  . Diabetes mellitus, type II ([place])   . Family history of adverse reaction to anesthesia    Mother - BP drops  . Head injury, closed, with brief LOC ([place]) 2011ish  . Hyperlipidemia   . Hypertension   . Insomnia   . Myalgia 03/29/2018   Due to statin  . Obesity   . Parkinson's disease ([place])   . Seasonal allergies   . Seizures ([place])   . Tobacco abuse     Past Surgical History:  Procedure Laterality Date  . ABDOMINAL HYSTERECTOMY  2012  . BREAST BIOPSY Right   . CESAREAN SECTION  1992  . CHOLECYSTECTOMY  2013  . LUMBAR LAMINECTOMY/DECOMPRESSION MICRODISCECTOMY Left 09/10/2015   Procedure: Left L4-5 Foraminotomy/Left L5-S1 Diskectomy;  Surgeon: Leeroy Cha, MD;  Location: [facility];  Service: Neurosurgery;  Laterality: Left;  Left L4-5 Foraminotomy/Left L5-S1 Diskectomy    Social History   Tobacco Use  . Smoking status: Current Every Day Smoker    Packs/day: 0.50    Years: 24.00    Pack years: 12.00    Types: Cigarettes    Start date: 03/19/2004  . Smokeless tobacco: Never Used  . Tobacco comment: states she wants to quit after covid-19 pandemic  Substance Use Topics  . Alcohol use: Yes    Alcohol/week: 0.0 standard drinks    Comment: occasional- rare     Current Outpatient Medications:  .  aspirin EC 81 MG tablet, Take 81 mg by mouth daily., Disp: , Rfl:  .  calcium citrate-vitamin D (CITRACAL+D) 315-200 MG-UNIT tablet, Take 1 tablet by mouth 2 (two) times daily., Disp: , Rfl:  .  Cholecalciferol 5000 units capsule, Take 5,000 Units by mouth. , Disp: , Rfl:  .  clonazePAM (KLONOPIN) 0.5 MG tablet, Take by mouth., Disp: , Rfl:  .  dapagliflozin propanediol (FARXIGA) 10 MG TABS tablet, Take 10 mg by mouth daily., Disp: 90 tablet, Rfl: 1 .  Evolocumab (REPATHA SURECLICK)  892 MG/ML SOAJ, Inject 140 mg into the skin every 14 (fourteen) days., Disp: 2 pen, Rfl: 12 .  folic acid (FOLVITE) 119 MCG tablet, Take 800 mcg by mouth daily. , Disp: , Rfl:  .  HUMALOG KWIKPEN 100 UNIT/ML KwikPen, Give subcutaneously with meals, 20 units in the AM and 35 units with lunch and evening meal, Disp: 27 pen, Rfl: 5 .  hydrochlorothiazide (HYDRODIURIL) 25 MG tablet, TAKE 1 TABLET DAILY (DOSE INCREASE, REPLACES ALL OTHER HCTZ PRESCRIPTIONS), Disp: 90 tablet, Rfl: 1 .  lamoTRIgine (LAMICTAL) 25 MG tablet, Take 3 tablets (75 mg total) by mouth daily., Disp: 270 tablet, Rfl: 1 .  lisinopril (PRINIVIL,ZESTRIL) 40 MG tablet, Take 1 tablet (40 mg total) by mouth daily., Disp: 90 tablet, Rfl: 3 .  Melatonin 10 MG TABS, Take 1 tablet by  mouth at bedtime., Disp: , Rfl:  .  Multiple Vitamins-Minerals (CENTRUM WOMEN) TABS, Take 1 tablet by mouth., Disp: , Rfl:  .  PARoxetine (PAXIL) 40 MG tablet, TAKE 1 TABLET EVERY MORNING, Disp: 90 tablet, Rfl: 1 .  pioglitazone (ACTOS) 30 MG tablet, Take 1 tablet (30 mg total) by mouth daily., Disp: 90 tablet, Rfl: 3 .  propranolol ER (INDERAL LA) 80 MG 24 hr capsule, TAKE 1 CAPSULE (80 MG TOTAL) BY MOUTH ONCE DAILY, Disp: , Rfl: 3 .  TRESIBA FLEXTOUCH 200 UNIT/ML SOPN, Inject 120 Units into the skin at bedtime., Disp: 36 pen, Rfl: 5 .  blood glucose meter kit and supplies, Use up to four times daily as directed, Dx E11.65, LON 99 months, Disp: 1 each, Rfl: 0 .  glucose blood (ONE TOUCH ULTRA TEST) test strip, USE UP TO FOUR TIMES A DAY AS DIRECTED; Dx 11.51, LON 99 months, Disp: 300 each, Rfl: 3 .  Insulin Pen Needle 29G X 12.7MM MISC, 1 each by Does not apply route as needed., Disp: 100 each, Rfl: 3 .  Lancets (ONETOUCH DELICA PLUS ZOXWRU04V) MISC, CHECK FINGER STICK BLOOD SUGARS FOUR TIMES A DAY, Disp: 300 each, Rfl: 3 .  loratadine (CLARITIN) 10 MG tablet, Take 1 tablet (10 mg total) by mouth daily as needed for allergies., Disp: 90 tablet, Rfl: 0 .   Magnesium 400 MG CAPS, Take 250 mg by mouth daily. , Disp: , Rfl:   Allergies  Allergen Reactions  . Clindamycin/Lincomycin     Pt states caused C-Diff  . Metformin And Related Diarrhea  . Benadryl [Diphenhydramine Hcl] Swelling    blistering  . Canagliflozin Other (See Comments)    Other reaction(s): Other (See Comments) Low bp Low bp Low bp  . Depakote Er [Divalproex Sodium Er] Other (See Comments)    seizures  . Dilaudid [Hydromorphone Hcl] Other (See Comments)    seizures  . Diphenhydramine-Zinc Acetate Other (See Comments)  . Erythromycin   . Ethanol   . Haloperidol Other (See Comments)  . Hydromorphone Other (See Comments)  . Neosporin [Neomycin-Bacitracin Zn-Polymyx] Swelling    blistering  . Singulair [Montelukast Sodium] Other (See Comments)    Asthma attacks  . Sitagliptin Other (See Comments)  . Statins Other (See Comments)    Other reaction(s): Unknown  . Sulfa Antibiotics Hives    Other reaction(s): Unknown  . Victoza [Liraglutide] Other (See Comments) and Nausea And Vomiting    Other reaction(s): Other (See Comments) pancreatitis pancreatitis    ROS   No other specific complaints in a complete review of systems (except as listed in HPI above).  Objective  Vitals:   06/13/19 1455  BP: 137/87  Pulse: 74  Temp: (!) 96.6 F (35.9 C)  TempSrc: Oral  Weight: 278 lb (126.1 kg)    Body mass index is 45.91 kg/m.  Nursing Note and Vital Signs reviewed.  Physical Exam  Constitutional: Patient appears well-developed and well-nourished. No distress.  HENT: Head: Normocephalic and atraumatic. Cardiovascular: Normal rate Pulmonary/Chest: Effort normal  Musculoskeletal: Normal range of motion,  Neurological: alert and oriented, speech normal.  Skin: No rash noted. No erythema.  Psychiatric: Patient has a normal mood and affect. behavior is normal. Judgment and thought content normal.    Assessment & Plan  1. Abdominal aortic atherosclerosis  ([place]) Follows up with cardiology, continue repatha  - Lipid Profile  2. Uncontrolled diabetes mellitus type 2 with atherosclerosis of arteries of extremities (HCC) Discussed goal of fasting blood glucose to  be under 150. Was seeing Dr. Gabriel Carina endocrinology but requesting referral to new provider. Will come in for labs only later this week to recheck levels. Will send to podiatry for foot care and patient would like consideration for diabetic shoes- no ulcers noted but she is having bilateral foot pain.  Discussed diet in detail - Ambulatory referral to Podiatry - Ambulatory referral to Endocrinology - HgB A1c 3. Obstructive sleep apnea Use CPAP  4. Bipolar disorder, in full remission, most recent episode mixed ([place]) Stable follow up with psychiatry   5. Parkinsonian tremor ([place]) Follow up with neurology   6. Bilateral foot pain - Ambulatory referral to Podiatry  7. Non-seasonal allergic rhinitis, unspecified trigger - loratadine (CLARITIN) 10 MG tablet; Take 1 tablet (10 mg total) by mouth daily as needed for allergies.  Dispense: 90 tablet; Refill: 0  8. Medication monitoring encounter - COMPLETE METABOLIC PANEL WITH GFR    Follow Up Instructions:    I discussed the assessment and treatment plan with the patient. The patient was provided an opportunity to ask questions and all were answered. The patient agreed with the plan and demonstrated an understanding of the instructions.   The patient was advised to call back or seek an in-person evaluation if the symptoms worsen or if the condition fails to improve as anticipated.  I provided 26 minutes of non-face-to-face time during this encounter.   Fredderick Severance, NP

## 2019-06-13 NOTE — Progress Notes (Signed)
Subjective:   Debra [name] is a 58 y.o. female who presents for Medicare Annual (Subsequent) preventive examination.  Virtual Visit via Telephone Note  I connected with Debra [name] on 06/13/19 at  2:50 PM EDT by telephone and verified that I am speaking with the correct person using two identifiers.  Medicare Annual Wellness visit completed telephonically due to Covid-19 pandemic.   Location: Patient: home Provider: office  I discussed the limitations, risks, security and privacy concerns of performing an evaluation and management service by telephone and the availability of in person appointments. The patient expressed understanding and agreed to proceed.  Some vital signs may be absent or patient reported.   Clemetine Marker, LPN  Review of Systems:   Cardiac Risk Factors include: diabetes mellitus;dyslipidemia;hypertension;obesity (BMI >30kg/m2);sedentary lifestyle;smoking/ tobacco exposure     Objective:     Vitals: BP 137/87   Pulse 74   Temp (!) 96.6 F (35.9 C)   Ht '5\' 5"'  (1.651 m)   Wt 278 lb (126.1 kg)   BMI 46.26 kg/m   Body mass index is 46.26 kg/m.  Advanced Directives 06/13/2019 07/01/2018 05/12/2018 02/19/2018 10/08/2017 09/29/2017 09/23/2017  Does Patient Have a Medical Advance Directive? No No No No No No No  Does patient want to make changes to medical advance directive? - - - - - - -  Would patient like information on creating a medical advance directive? Yes (MAU/Ambulatory/Procedural Areas - Information given) No - Patient declined No - Patient declined No - Patient declined Yes (MAU/Ambulatory/Procedural Areas - Information given) No - Patient declined -  Some encounter information is confidential and restricted. Go to Review Flowsheets activity to see all data.    Tobacco Social History   Tobacco Use  Smoking Status Current Every Day Smoker  . Packs/day: 0.50  . Years: 24.00  . Pack years: 12.00  . Types: Cigarettes  . Start date: 03/19/2004  Smokeless  Tobacco Never Used  Tobacco Comment   states she wants to quit after covid-19 pandemic     Ready to quit: Yes Counseling given: Yes Comment: states she wants to quit after covid-19 pandemic   Clinical Intake:  Pre-visit preparation completed: Yes  Pain : No/denies pain     BMI - recorded: 46.26 Nutritional Status: BMI > 30  Obese Nutritional Risks: None Diabetes: Yes CBG done?: No Did pt. bring in CBG monitor from home?: No  How often do you need to have someone help you when you read instructions, pamphlets, or other written materials from your doctor or pharmacy?: 1 - Never  Interpreter Needed?: No  Information entered by :: Clemetine Marker LPN  Past Medical History:  Diagnosis Date  . Anxiety   . Arthritis   . C. difficile colitis   . C. difficile colitis   . Depression   . Diabetes mellitus without complication ([place])    Type II  . Diabetes mellitus, type II ([place])   . Family history of adverse reaction to anesthesia    Mother - BP drops  . Head injury, closed, with brief LOC ([place]) 2011ish  . Hyperlipidemia   . Hypertension   . Insomnia   . Myalgia 03/29/2018   Due to statin  . Obesity   . Parkinson's disease ([place])   . Seasonal allergies   . Seizures ([place])   . Tobacco abuse    Past Surgical History:  Procedure Laterality Date  . ABDOMINAL HYSTERECTOMY  2012  . BREAST BIOPSY Right   .  CESAREAN SECTION  1992  . CHOLECYSTECTOMY  2013  . LUMBAR LAMINECTOMY/DECOMPRESSION MICRODISCECTOMY Left 09/10/2015   Procedure: Left L4-5 Foraminotomy/Left L5-S1 Diskectomy;  Surgeon: Leeroy Cha, MD;  Location: [facility];  Service: Neurosurgery;  Laterality: Left;  Left L4-5 Foraminotomy/Left L5-S1 Diskectomy   Family History  Problem Relation Age of Onset  . COPD Mother   . Bipolar disorder Father   . Diabetes Mellitus II Father   . Bipolar disorder Brother   . Diabetes Mellitus II Brother   . Bipolar disorder Brother    Social History   Socioeconomic History   . Marital status: Married    Spouse name: [name]  . Number of children: 1  . Years of education: Not on file  . Highest education level: Associate degree: occupational, Hotel manager, or vocational program  Occupational History  . Occupation: Disabled  Social Needs  . Financial resource strain: Not hard at all  . Food insecurity    Worry: Sometimes true    Inability: Never true  . Transportation needs    Medical: No    Non-medical: No  Tobacco Use  . Smoking status: Current Every Day Smoker    Packs/day: 0.50    Years: 24.00    Pack years: 12.00    Types: Cigarettes    Start date: 03/19/2004  . Smokeless tobacco: Never Used  . Tobacco comment: states she wants to quit after covid-19 pandemic  Substance and Sexual Activity  . Alcohol use: Yes    Alcohol/week: 0.0 standard drinks    Comment: occasional- rare  . Drug use: No  . Sexual activity: Not Currently  Lifestyle  . Physical activity    Days per week: 0 days    Minutes per session: 0 min  . Stress: To some extent  Relationships  . Social connections    Talks on phone: More than three times a week    Gets together: Twice a week    Attends religious service: Never    Active member of club or organization: No    Attends meetings of clubs or organizations: Never    Relationship status: Married  Other Topics Concern  . Not on file  Social History Narrative   Live in private residence w/ husband and their friend who is wheelchair bound, her end of the house is wheelchair accessible.     Outpatient Encounter Medications as of 06/13/2019  Medication Sig  . aspirin EC 81 MG tablet Take 81 mg by mouth daily.  . blood glucose meter kit and supplies Use up to four times daily as directed, Dx E11.65, LON 99 months  . calcium citrate-vitamin D (CITRACAL+D) 315-200 MG-UNIT tablet Take 1 tablet by mouth 2 (two) times daily.  . Cholecalciferol 5000 units capsule Take 5,000 Units by mouth.   . clonazePAM (KLONOPIN) 0.5 MG tablet  Take by mouth.  . dapagliflozin propanediol (FARXIGA) 10 MG TABS tablet Take 10 mg by mouth daily.  . Evolocumab (REPATHA SURECLICK) 768 MG/ML SOAJ Inject 140 mg into the skin every 14 (fourteen) days.  . folic acid (FOLVITE) 088 MCG tablet Take 800 mcg by mouth daily.   [name] HUMALOG KWIKPEN 100 UNIT/ML KwikPen Give subcutaneously with meals, 20 units in the AM and 35 units with lunch and evening meal  . hydrochlorothiazide (HYDRODIURIL) 25 MG tablet TAKE 1 TABLET DAILY (DOSE INCREASE, REPLACES ALL OTHER HCTZ PRESCRIPTIONS)  . Insulin Pen Needle 29G X 12.7MM MISC 1 each by Does not apply route as needed.  . lamoTRIgine (  LAMICTAL) 25 MG tablet Take 3 tablets (75 mg total) by mouth daily.  . Lancets (ONETOUCH DELICA PLUS [id number]) MISC CHECK FINGER STICK BLOOD SUGARS FOUR TIMES A DAY  . lisinopril (PRINIVIL,ZESTRIL) 40 MG tablet Take 1 tablet (40 mg total) by mouth daily.  . Melatonin 10 MG TABS Take 1 tablet by mouth at bedtime.  . Multiple Vitamins-Minerals (CENTRUM WOMEN) TABS Take 1 tablet by mouth.  [name] PARoxetine (PAXIL) 40 MG tablet TAKE 1 TABLET EVERY MORNING  . pioglitazone (ACTOS) 30 MG tablet Take 1 tablet (30 mg total) by mouth daily.  . propranolol ER (INDERAL LA) 80 MG 24 hr capsule TAKE 1 CAPSULE (80 MG TOTAL) BY MOUTH ONCE DAILY  . TRESIBA FLEXTOUCH 200 UNIT/ML SOPN Inject 120 Units into the skin at bedtime.  . Colesevelam HCl (WELCHOL) 3.75 g PACK Take 0.5 packets by mouth daily. Mixed in liquid (Patient not taking: Reported on 05/24/2019)  . glucose blood (ONE TOUCH ULTRA TEST) test strip USE UP TO FOUR TIMES A DAY AS DIRECTED; Dx 11.51, LON 99 months  . Magnesium 400 MG CAPS Take 250 mg by mouth daily.   . [DISCONTINUED] amoxicillin-clavulanate (AUGMENTIN) 875-125 MG tablet Take 1 tablet by mouth 2 (two) times daily. (Patient not taking: Reported on 06/13/2019)   No facility-administered encounter medications on file as of 06/13/2019.     Activities of Daily Living In your present  state of health, do you have any difficulty performing the following activities: 06/13/2019 06/13/2019  Hearing? N N  Comment declines hearing aids -  Vision? N N  Difficulty concentrating or making decisions? N N  Walking or climbing stairs? N N  Dressing or bathing? N N  Doing errands, shopping? N [place] and eating ? N -  Using the Toilet? N -  In the past six months, have you accidently leaked urine? N -  Do you have problems with loss of bowel control? N -  Managing your Medications? N -  Managing your Finances? N -  Housekeeping or managing your Housekeeping? N -  Some recent data might be hidden    Patient Care Team: Lada, Satira Anis, MD as PCP - General (Family Medicine) Solum, Betsey Holiday, MD as Physician Assistant (Internal Medicine) Vladimir Crofts, MD as Consulting Physician (Neurology)    Assessment:   This is a routine wellness examination for Debra [name].  Exercise Activities and Dietary recommendations Current Exercise Habits: The patient does not participate in regular exercise at present, Exercise limited by: orthopedic condition(s)  Goals    . Increase water intake     Starting 09/21/16, I will continue to drink 8 or more glasses of water.    . Reduce portion size     Recommend to eat 3 small healthy meals per day and at least 2 small healthy snacks per day       Fall Risk Fall Risk  06/13/2019 05/24/2019 03/20/2019 11/18/2018 08/19/2018  Falls in the past year? 0 0 0 0 No  Number falls in past yr: 0 0 0 - -  Injury with Fall? 0 0 0 - -  Comment - - - - -  Risk Factor Category  - - - - -  Comment - - - - -  Risk for fall due to : - - - - -  Risk for fall due to: Comment - - - - -  Follow up - - - - -   FALL RISK PREVENTION  PERTAINING TO THE HOME:  Any stairs in or around the home? No  If so, do they handrails? No   Home free of loose throw rugs in walkways, pet beds, electrical cords, etc? Yes  Adequate lighting in your home to reduce risk  of falls? Yes   ASSISTIVE DEVICES UTILIZED TO PREVENT FALLS:  Life alert? No  Use of a cane, walker or w/c? No  Grab bars in the bathroom? Yes  Shower chair or bench in shower? Yes  Elevated toilet seat or a handicapped toilet? Yes   DME ORDERS:  DME order needed?  No   TIMED UP AND GO:  Was the test performed? No .  Telephonic visit.   Education: Fall risk prevention has been discussed.  Intervention(s) required? No   Depression Screen PHQ 2/9 Scores 06/13/2019 05/24/2019 03/20/2019 11/18/2018  PHQ - 2 Score 0 0 0 2  PHQ- 9 Score 0 0 0 8     Cognitive Function MMSE - Mini Mental State Exam 09/21/2016  Orientation to time 5  Orientation to Place 5  Registration 3  Attention/ Calculation 5  Recall 3  Language- name 2 objects 2  Language- repeat 1  Language- follow 3 step command 3  Language- read & follow direction 0  Write a sentence 0  Copy design 0  Total score 27     6CIT Screen 06/13/2019 09/29/2017  What Year? 0 points 0 points  What month? 0 points 0 points  What time? 0 points 0 points  Count back from 20 0 points 0 points  Months in reverse 0 points 0 points  Repeat phrase 0 points 0 points  Total Score 0 0    Immunization History  Administered Date(s) Administered  . H1N1 10/16/2008  . Hepatitis A, Adult 09/12/2002  . Hepatitis B 12/20/2014, 01/29/2015  . Hepatitis B, adult 12/20/2014, 01/29/2015, 07/22/2015  . Influenza Split 09/28/2002, 10/08/2005, 09/09/2006, 10/21/2007, 09/23/2009  . Influenza, Seasonal, Injecte, Preservative Fre 09/03/2014  . Influenza,inj,Quad PF,6+ Mos 08/28/2015, 09/21/2016, 09/29/2017, 08/19/2018  . Influenza-Unspecified 10/23/2010, 11/16/2011  . Pneumococcal Polysaccharide-23 12/19/2003, 01/16/2010, 07/02/2014  . Tdap 06/21/2006, 07/02/2014    Qualifies for Shingles Vaccine? Yes . Due for Shingrix. Education has been provided regarding the importance of this vaccine. Pt has been advised to call insurance company to  determine out of pocket expense. Advised may also receive vaccine at local pharmacy or Health Dept. Verbalized acceptance and understanding.  Tdap: Up to date  Flu Vaccine: Up to date  Pneumococcal Vaccine: Up to date   Screening Tests Health Maintenance  Topic Date Due  . OPHTHALMOLOGY EXAM  06/07/2018  . MAMMOGRAM  03/17/2019  . HEMOGLOBIN A1C  05/20/2019  . INFLUENZA VACCINE  07/08/2019  . FOOT EXAM  11/19/2019  . Fecal DNA (Cologuard)  03/03/2021  . TETANUS/TDAP  07/02/2024  . DEXA SCAN  03/16/2028  . PNEUMOCOCCAL POLYSACCHARIDE VACCINE AGE 91-64 HIGH RISK  Completed  . Hepatitis C Screening  Completed  . HIV Screening  Completed  . PAP SMEAR-Modifier  Discontinued    Cancer Screenings:  Colorectal Screening: Cologuard Completed 03/03/18. Repeat every 3 years.  Mammogram: Completed 03/16/18. Repeat every year. Ordered today. Pt provided with contact information and advised to call to schedule appt.   Bone Density: Completed 03/16/18. Results reflect  OSTEOPENIA. Repeat every 2 years.   Lung Cancer Screening: (Low Dose CT Chest recommended if Age 23-80 years, 30 pack-year currently smoking OR have quit w/in 15years.) does not qualify.  Additional Screening:  Hepatitis C Screening: does qualify; Completed 09/21/16  Vision Screening: Recommended annual ophthalmology exams for early detection of glaucoma and other disorders of the eye. Is the patient up to date with their annual eye exam?  No  Who is the provider or what is the name of the office in which the pt attends annual eye exams? [facility]  Dental Screening: Recommended annual dental exams for proper oral hygiene  Community Resource Referral:  CRR required this visit?  No      Plan:     I have personally reviewed and addressed the Medicare Annual Wellness questionnaire and have noted the following in the patient's chart:  A. Medical and social history B. Use of alcohol, tobacco or illicit drugs  C.  Current medications and supplements D. Functional ability and status E.  Nutritional status F.  Physical activity G. Advance directives H. List of other physicians I.  Hospitalizations, surgeries, and ER visits in previous 12 months J.  [place] such as hearing and vision if needed, cognitive and depression L. Referrals and appointments   In addition, I have reviewed and discussed with patient certain preventive protocols, quality metrics, and best practice recommendations. A written personalized care plan for preventive services as well as general preventive health recommendations were provided to patient.   Signed,  Clemetine Marker, LPN Nurse Health Advisor   Nurse Notes: pt appreciative of visit today. She c/o intermittent sharp, shooting pains throughout her body that feel like pin pricks and does not the source of her pain. Pt has same day virtual appt with Benjamine Mola and would like to discuss sliding scale for humalog due to blood sugar dropping at times since she is trying to eat less carbs. She would also like to discuss diabetic shoes in hopes this would help her feet and hip pains which make it difficult for her to exercise. Pt states she has not completed a diabetic education course that she is aware of and declined my referral to care management today with plans to do research online. Information provided about reputable resources for her to look into.

## 2019-06-13 NOTE — Patient Instructions (Signed)
Debra [name] , Thank you for taking time to come for your Medicare Wellness Visit. I appreciate your ongoing commitment to your health goals. Please review the following plan we discussed and let me know if I can assist you in the future.   Screening recommendations/referrals: Colonoscopy: Cologuard completed 03/03/18. Repeat in 2022. Mammogram: done 03/16/18. Please call [phone] to schedule your mammogram.  Bone Density: done 03/16/18 Recommended yearly ophthalmology/optometry visit for glaucoma screening and checkup Recommended yearly dental visit for hygiene and checkup  Vaccinations: Influenza vaccine: done 08/19/18 Pneumococcal vaccine: done 07/02/14 Tdap vaccine: done 07/02/14 Shingles vaccine: Shingrix discussed. Please contact your pharmacy for coverage information.   Advanced directives: Advance directive discussed with you today. I have mailed a copy for you to complete at home and have notarized. Once this is complete please bring a copy in to our office so we can scan it into your chart.  Conditions/risks identified: If you wish to quit smoking, help is available. For free tobacco cessation program offerings call the [facility] at 937-732-3541 or Live Well Line at (463)773-8305. You may also visit www.Industry.com or email livelifewell_[email or web address] .com for more information on other programs.   Diabetic education resources:  Https://www.cornerstones4care.com  American Diabetes Association https://www.diabetes.org   Next appointment: Please follow up in one year for your Medicare Annual Wellness visit.    Preventive Care 40-64 Years, Female Preventive care refers to lifestyle choices and visits with your health care provider that can promote health and wellness. What does preventive care include?  A yearly physical exam. This is also called an annual well check.  Dental exams once or twice a year.  Routine eye exams. Ask your health care provider how often  you should have your eyes checked.  Personal lifestyle choices, including:  Daily care of your teeth and gums.  Regular physical activity.  Eating a healthy diet.  Avoiding tobacco and drug use.  Limiting alcohol use.  Practicing safe sex.  Taking low-dose aspirin daily starting at age 34.  Taking vitamin and mineral supplements as recommended by your health care provider. What happens during an annual well check? The services and screenings done by your health care provider during your annual well check will depend on your age, overall health, lifestyle risk factors, and family history of disease. Counseling  Your health care provider may ask you questions about your:  Alcohol use.  Tobacco use.  Drug use.  Emotional well-being.  Home and relationship well-being.  Sexual activity.  Eating habits.  Work and work Statistician.  Method of birth control.  Menstrual cycle.  Pregnancy history. Screening  You may have the following tests or measurements:  Height, weight, and BMI.  Blood pressure.  Lipid and cholesterol levels. These may be checked every 5 years, or more frequently if you are over 37 years old.  Skin check.  Lung cancer screening. You may have this screening every year starting at age 69 if you have a 30-pack-year history of smoking and currently smoke or have quit within the past 15 years.  Fecal occult blood test (FOBT) of the stool. You may have this test every year starting at age 46.  Flexible sigmoidoscopy or colonoscopy. You may have a sigmoidoscopy every 5 years or a colonoscopy every 10 years starting at age 12.  Hepatitis C blood test.  Hepatitis B blood test.  Sexually transmitted disease (STD) testing.  Diabetes screening. This is done by checking your blood sugar (glucose) after you have not  eaten for a while (fasting). You may have this done every 1-3 years.  Mammogram. This may be done every 1-2 years. Talk to your health  care provider about when you should start having regular mammograms. This may depend on whether you have a family history of breast cancer.  BRCA-related cancer screening. This may be done if you have a family history of breast, ovarian, tubal, or peritoneal cancers.  Pelvic exam and Pap test. This may be done every 3 years starting at age 55. Starting at age 72, this may be done every 5 years if you have a Pap test in combination with an HPV test.  Bone density scan. This is done to screen for osteoporosis. You may have this scan if you are at high risk for osteoporosis. Discuss your test results, treatment options, and if necessary, the need for more tests with your health care provider. Vaccines  Your health care provider may recommend certain vaccines, such as:  Influenza vaccine. This is recommended every year.  Tetanus, diphtheria, and acellular pertussis (Tdap, Td) vaccine. You may need a Td booster every 10 years.  Zoster vaccine. You may need this after age 15.  Pneumococcal 13-valent conjugate (PCV13) vaccine. You may need this if you have certain conditions and were not previously vaccinated.  Pneumococcal polysaccharide (PPSV23) vaccine. You may need one or two doses if you smoke cigarettes or if you have certain conditions. Talk to your health care provider about which screenings and vaccines you need and how often you need them. This information is not intended to replace advice given to you by your health care provider. Make sure you discuss any questions you have with your health care provider. Document Released: 12/20/2015 Document Revised: 08/12/2016 Document Reviewed: 09/24/2015 Elsevier Interactive Patient Education  2017 [place] Prevention in the Home Falls can cause injuries. They can happen to people of all ages. There are many things you can do to make your home safe and to help prevent falls. What can I do on the outside of my home?  Regularly  fix the edges of walkways and driveways and fix any cracks.  Remove anything that might make you trip as you walk through a door, such as a raised step or threshold.  Trim any bushes or trees on the path to your home.  Use bright outdoor lighting.  Clear any walking paths of anything that might make someone trip, such as rocks or tools.  Regularly check to see if handrails are loose or broken. Make sure that both sides of any steps have handrails.  Any raised decks and porches should have guardrails on the edges.  Have any leaves, snow, or ice cleared regularly.  Use sand or salt on walking paths during winter.  Clean up any spills in your garage right away. This includes oil or grease spills. What can I do in the bathroom?  Use night lights.  Install grab bars by the toilet and in the tub and shower. Do not use towel bars as grab bars.  Use non-skid mats or decals in the tub or shower.  If you need to sit down in the shower, use a plastic, non-slip stool.  Keep the floor dry. Clean up any water that spills on the floor as soon as it happens.  Remove soap buildup in the tub or shower regularly.  Attach bath mats securely with double-sided non-slip rug tape.  Do not have throw rugs and other things on  the floor that can make you trip. What can I do in the bedroom?  Use night lights.  Make sure that you have a light by your bed that is easy to reach.  Do not use any sheets or blankets that are too big for your bed. They should not hang down onto the floor.  Have a firm chair that has side arms. You can use this for support while you get dressed.  Do not have throw rugs and other things on the floor that can make you trip. What can I do in the kitchen?  Clean up any spills right away.  Avoid walking on wet floors.  Keep items that you use a lot in easy-to-reach places.  If you need to reach something above you, use a strong step stool that has a grab bar.  Keep  electrical cords out of the way.  Do not use floor polish or wax that makes floors slippery. If you must use wax, use non-skid floor wax.  Do not have throw rugs and other things on the floor that can make you trip. What can I do with my stairs?  Do not leave any items on the stairs.  Make sure that there are handrails on both sides of the stairs and use them. Fix handrails that are broken or loose. Make sure that handrails are as long as the stairways.  Check any carpeting to make sure that it is firmly attached to the stairs. Fix any carpet that is loose or worn.  Avoid having throw rugs at the top or bottom of the stairs. If you do have throw rugs, attach them to the floor with carpet tape.  Make sure that you have a light switch at the top of the stairs and the bottom of the stairs. If you do not have them, ask someone to add them for you. What else can I do to help prevent falls?  Wear shoes that:  Do not have high heels.  Have rubber bottoms.  Are comfortable and fit you well.  Are closed at the toe. Do not wear sandals.  If you use a stepladder:  Make sure that it is fully opened. Do not climb a closed stepladder.  Make sure that both sides of the stepladder are locked into place.  Ask someone to hold it for you, if possible.  Clearly mark and make sure that you can see:  Any grab bars or handrails.  First and last steps.  Where the edge of each step is.  Use tools that help you move around (mobility aids) if they are needed. These include:  Canes.  Walkers.  Scooters.  Crutches.  Turn on the lights when you go into a dark area. Replace any light bulbs as soon as they burn out.  Set up your furniture so you have a clear path. Avoid moving your furniture around.  If any of your floors are uneven, fix them.  If there are any pets around you, be aware of where they are.  Review your medicines with your doctor. Some medicines can make you feel dizzy.  This can increase your chance of falling. Ask your doctor what other things that you can do to help prevent falls. This information is not intended to replace advice given to you by your health care provider. Make sure you discuss any questions you have with your health care provider. Document Released: 09/19/2009 Document Revised: 04/30/2016 Document Reviewed: 12/28/2014 Elsevier Interactive Patient Education  2017 [place].

## 2019-06-14 ENCOUNTER — Ambulatory Visit (INDEPENDENT_AMBULATORY_CARE_PROVIDER_SITE_OTHER): Payer: 59 | Admitting: Licensed Clinical Social Worker

## 2019-06-14 ENCOUNTER — Encounter: Payer: Self-pay | Admitting: Licensed Clinical Social Worker

## 2019-06-14 ENCOUNTER — Other Ambulatory Visit: Payer: Self-pay

## 2019-06-14 DIAGNOSIS — F3162 Bipolar disorder, current episode mixed, moderate: Secondary | ICD-10-CM | POA: Diagnosis not present

## 2019-06-14 NOTE — Progress Notes (Signed)
Virtual Visit via Video Note  I connected with Debra [name] on 06/14/19 at 11:00 AM EDT by a video enabled telemedicine application and verified that I am speaking with the correct person using two identifiers.   I discussed the limitations of evaluation and management by telemedicine and the availability of in person appointments. The patient expressed understanding and agreed to proceed.    I discussed the assessment and treatment plan with the patient. The patient was provided an opportunity to ask questions and all were answered. The patient agreed with the plan and demonstrated an understanding of the instructions.   The patient was advised to call back or seek an in-person evaluation if the symptoms worsen or if the condition fails to improve as anticipated.  I provided 30 minutes of non-face-to-face time during this encounter.   Debra Hipp, LCSW   THERAPIST PROGRESS NOTE  Session Time: 1100  Participation Level: Minimal  Behavioral Response: CasualAlertAnxious  Type of Therapy: Individual Therapy  Treatment Goals addressed: Anxiety  Interventions: CBT  Summary: Debra [name] is a 58 y.o. female who presents with continued symptoms related to her diagnosis. Debra [name] reports doing well since our last session. She reports feeling overwhelmed about her mother's health, and not being able to visit due to worrying she will give her mother COVID-19. LCSW encouraged Debra [name] to focus on the parts of the situation she can control. LCSW encouraged Debra [name] to contact her doctor about getting a COVID test, in order to eliminate that worry, and ultimately be able to see her mother. Debra [name] expressed understanding and agreement with this idea. Further, we discussed ways Debra [name] could increase her motivation to complete housework throughout the week. LCSW encouraged Debra [name] to begin setting smaller goals for herself in order to feel less overwhelmed by what she needs to do in a day. Debra [name] expressed understanding and  agreement with this idea as well.   Suicidal/Homicidal: No  Therapist Response: Debra [name] continues to work towards her tx goals but has not yet reached them. We will continue to work on emotional regulation skills moving forward.    Plan: Return again in 4 weeks.  Diagnosis: Axis I: Bipolar Disorder    Axis II: No diagnosis    Debra Hipp, LCSW 06/14/2019

## 2019-06-17 LAB — COMPLETE METABOLIC PANEL WITH GFR
AG Ratio: 2.1 (calc) (ref 1.0–2.5)
ALT: 22 U/L (ref 6–29)
AST: 17 U/L (ref 10–35)
Albumin: 4.2 g/dL (ref 3.6–5.1)
Alkaline phosphatase (APISO): 71 U/L (ref 37–153)
BUN: 18 mg/dL (ref 7–25)
CO2: 28 mmol/L (ref 20–32)
Calcium: 9.6 mg/dL (ref 8.6–10.4)
Chloride: 103 mmol/L (ref 98–110)
Creat: 0.92 mg/dL (ref 0.50–1.05)
GFR, Est African American: 81 mL/min/{1.73_m2} (ref >=60)
GFR, Est Non African American: 70 mL/min/{1.73_m2} (ref >=60)
Globulin: 2 g/dL (calc) (ref 1.9–3.7)
Glucose, Bld: 206 mg/dL — ABNORMAL HIGH (ref 65–99)
Potassium: 4.5 mmol/L (ref 3.5–5.3)
Sodium: 141 mmol/L (ref 135–146)
Total Bilirubin: 0.6 mg/dL (ref 0.2–1.2)
Total Protein: 6.2 g/dL (ref 6.1–8.1)

## 2019-06-17 LAB — HEMOGLOBIN A1C
Hgb A1c MFr Bld: 9.1 % of total Hgb — ABNORMAL HIGH (ref <5.7)
Mean Plasma Glucose: 214 (calc)
eAG (mmol/L): 11.9 (calc)

## 2019-06-17 LAB — LIPID PANEL
Cholesterol: 120 mg/dL (ref <200)
HDL: 52 mg/dL (ref >=50)
LDL Cholesterol (Calc): 49 mg/dL (calc)
Non-HDL Cholesterol (Calc): 68 mg/dL (calc) (ref <130)
Total CHOL/HDL Ratio: 2.3 (calc) (ref <5.0)
Triglycerides: 107 mg/dL (ref <150)

## 2019-06-19 ENCOUNTER — Ambulatory Visit: Payer: Self-pay | Admitting: Family Medicine

## 2019-06-30 ENCOUNTER — Other Ambulatory Visit: Payer: Self-pay

## 2019-06-30 ENCOUNTER — Ambulatory Visit (INDEPENDENT_AMBULATORY_CARE_PROVIDER_SITE_OTHER): Payer: Managed Care, Other (non HMO) | Admitting: Podiatry

## 2019-06-30 ENCOUNTER — Encounter: Payer: Self-pay | Admitting: Podiatry

## 2019-06-30 ENCOUNTER — Other Ambulatory Visit: Payer: Self-pay | Admitting: Psychiatry

## 2019-06-30 ENCOUNTER — Other Ambulatory Visit: Payer: Self-pay | Admitting: Podiatry

## 2019-06-30 ENCOUNTER — Ambulatory Visit (INDEPENDENT_AMBULATORY_CARE_PROVIDER_SITE_OTHER): Payer: Managed Care, Other (non HMO)

## 2019-06-30 VITALS — Temp 97.3°F

## 2019-06-30 DIAGNOSIS — F3162 Bipolar disorder, current episode mixed, moderate: Secondary | ICD-10-CM

## 2019-06-30 DIAGNOSIS — G629 Polyneuropathy, unspecified: Secondary | ICD-10-CM

## 2019-06-30 DIAGNOSIS — E0843 Diabetes mellitus due to underlying condition with diabetic autonomic (poly)neuropathy: Secondary | ICD-10-CM

## 2019-06-30 DIAGNOSIS — M779 Enthesopathy, unspecified: Secondary | ICD-10-CM

## 2019-06-30 MED ORDER — GABAPENTIN 100 MG PO CAPS: 100.0000 mg | ORAL_CAPSULE | Freq: Three times a day (TID) | ORAL | 3 refills | Status: DC

## 2019-07-01 ENCOUNTER — Other Ambulatory Visit: Payer: Self-pay | Admitting: Family Medicine

## 2019-07-07 NOTE — Progress Notes (Signed)
   HPI: 58 y.o. female with PMHx of T2DM presenting today as a new patient with a chief complaint of constant throbbing pain that has been ongoing for the past few years. She reports associated intermittent shooting pain. She states the pain has been worsening over the past year. She has tried wearing different shoes for treatment. Patient is here for further evaluation and treatment.   Past Medical History:  Diagnosis Date  . Anxiety   . Arthritis   . C. difficile colitis   . C. difficile colitis   . Depression   . Diabetes mellitus without complication ([place])    Type II  . Diabetes mellitus, type II ([place])   . Family history of adverse reaction to anesthesia    Mother - BP drops  . Head injury, closed, with brief LOC ([place]) 2011ish  . Hyperlipidemia   . Hypertension   . Insomnia   . Myalgia 03/29/2018   Due to statin  . Obesity   . Parkinson's disease ([place])   . Seasonal allergies   . Seizures ([place])   . Tobacco abuse      Physical Exam: General: The patient is alert and oriented x3 in no acute distress.  Dermatology: Skin is warm, dry and supple bilateral lower extremities. Negative for open lesions or macerations.  Vascular: Palpable pedal pulses bilaterally. No edema or erythema noted. Capillary refill within normal limits.  Neurological: Epicritic and protective threshold diminished bilaterally.   Musculoskeletal Exam: Range of motion within normal limits to all pedal and ankle joints bilateral. Muscle strength 5/5 in all groups bilateral.   Radiographic Exam:  Normal osseous mineralization. Joint spaces preserved. No fracture/dislocation/boney destruction.    Assessment: 1. DM with peripheral neuropathy    Plan of Care:  1. Patient evaluated. X-Rays reviewed.  2. Appointment with Liliane Channel, Pedorthist, for DM shoes. 3. Prescription for Gabapentin 100 mg TID provided to patient.  4. Return to clinic as needed.      Edrick Kins, DPM [facility]  Dr.  Edrick Kins, DPM    [address], [place] [address]                Office [phone]  Fax [phone]

## 2019-07-12 ENCOUNTER — Other Ambulatory Visit: Payer: Self-pay | Admitting: Psychiatry

## 2019-07-12 ENCOUNTER — Ambulatory Visit: Payer: Medicare Other | Admitting: Orthotics

## 2019-07-12 ENCOUNTER — Other Ambulatory Visit: Payer: Self-pay

## 2019-07-12 DIAGNOSIS — G629 Polyneuropathy, unspecified: Secondary | ICD-10-CM

## 2019-07-12 DIAGNOSIS — F3162 Bipolar disorder, current episode mixed, moderate: Secondary | ICD-10-CM

## 2019-07-12 DIAGNOSIS — E0843 Diabetes mellitus due to underlying condition with diabetic autonomic (poly)neuropathy: Secondary | ICD-10-CM

## 2019-07-12 NOTE — Progress Notes (Signed)

## 2019-07-13 ENCOUNTER — Encounter: Payer: Self-pay | Admitting: Licensed Clinical Social Worker

## 2019-07-13 ENCOUNTER — Ambulatory Visit (INDEPENDENT_AMBULATORY_CARE_PROVIDER_SITE_OTHER): Payer: 59 | Admitting: Licensed Clinical Social Worker

## 2019-07-13 ENCOUNTER — Telehealth: Payer: Self-pay | Admitting: Psychiatry

## 2019-07-13 DIAGNOSIS — F3162 Bipolar disorder, current episode mixed, moderate: Secondary | ICD-10-CM | POA: Diagnosis not present

## 2019-07-13 NOTE — Progress Notes (Signed)
  Virtual Visit via Video Note  I connected with Debra [name] on 07/13/19 at 11:00 AM EDT by a video enabled telemedicine application and verified that I am speaking with the correct person using two identifiers.   I discussed the limitations of evaluation and management by telemedicine and the availability of in person appointments. The patient expressed understanding and agreed to proceed.  I discussed the assessment and treatment plan with the patient. The patient was provided an opportunity to ask questions and all were answered. The patient agreed with the plan and demonstrated an understanding of the instructions.   The patient was advised to call back or seek an in-person evaluation if the symptoms worsen or if the condition fails to improve as anticipated.  I provided 25 minutes of non-face-to-face time during this encounter.   Alden Hipp, LCSW   THERAPIST PROGRESS NOTE  Session Time: 1100  Participation Level: Minimal  Behavioral Response: NeatAlertAnxious  Type of Therapy: Individual Therapy  Treatment Goals addressed: Anxiety  Interventions: Supportive  Summary: Debra [name] is a 58 y.o. female who presents with continued symptoms related to her diagnosis. Debra [name] reports doing well since our last session. She reports her anxiety and depression symptoms have improved, and she has felt more stable in her mental health. She reports being adherent with all medications, and added she is almost out of one medication and needs a refill. LCSW sent a message to MD alerting her of this information and instructed client to follow up with MD regarding meds. Debra [name] went on to discus how she was able to set boundaries with the woman who lives with her, and they have been getting along much better since that time. LCSW validated that outcome and highlighted how Debra [name] was able to improve her circumstances by setting boundaries and utilizing assertive communication. Debra [name] was able to recognize this as  well and expressed being happy she had the conversation. Debra [name] reported she has also been setting small cleaning goals for herself each day, and has felt more motivated to complete house work because of this. "I really like marking things off my to-do list. It makes me feel like I actually did something in a day." LCSW validated those feelings and encouraged Debra [name] to continue this practice until it becomes a habit. Debra [name] expressed understanding and agreement with this idea as well.   Suicidal/Homicidal: No  Therapist Response: Debra [name] continues to work towards her tx goals. We will continue to work on improving communication skills and goal setting moving forward.   Plan: Return again in 4 weeks.  Diagnosis: Axis I: Bipolar 1 Disorder    Axis II: No diagnosis    Alden Hipp, LCSW 07/13/2019

## 2019-07-13 NOTE — Telephone Encounter (Signed)
Received message from [name] that patient reported she does not have refills.  Per review of her chart 19-month supply of medications for lamotrigine as well as Paxil was sent over in June.  It looks like patient does have enough refills.  We will ask Pam-CMA to clarify with patient.

## 2019-08-03 ENCOUNTER — Other Ambulatory Visit: Payer: Self-pay | Admitting: Family Medicine

## 2019-08-08 ENCOUNTER — Other Ambulatory Visit: Payer: Self-pay

## 2019-08-08 MED ORDER — REPATHA SURECLICK 140 MG/ML ~~LOC~~ SOAJ: 140.0000 mg | SUBCUTANEOUS | 12 refills | Status: DC

## 2019-08-17 ENCOUNTER — Ambulatory Visit: Payer: Medicare Other | Admitting: Licensed Clinical Social Worker

## 2019-08-17 ENCOUNTER — Other Ambulatory Visit: Payer: Self-pay

## 2019-08-21 ENCOUNTER — Telehealth: Payer: Self-pay

## 2019-08-21 DIAGNOSIS — F3162 Bipolar disorder, current episode mixed, moderate: Secondary | ICD-10-CM

## 2019-08-21 MED ORDER — LAMOTRIGINE 25 MG PO TABS: 75.0000 mg | ORAL_TABLET | Freq: Every day | ORAL | 1 refills | Status: DC

## 2019-08-21 NOTE — Telephone Encounter (Signed)
  received a fax requesting a 90 day supply of the lamotrigine 25mg   lamoTRIgine (LAMICTAL) 25 MG tablet Medication Date: 05/23/2019 Department: [facility] Ordering/Authorizing: Ursula Alert, MD  Order Providers  Prescribing Provider Encounter Provider  Ursula Alert, MD Ursula Alert, MD  Outpatient Medication Detail   Disp Refills Start End   lamoTRIgine (LAMICTAL) 25 MG tablet 270 tablet 1 05/23/2019    Sig - Route: Take 3 tablets (75 mg total) by mouth daily. - Oral   Sent to pharmacy as: lamoTRIgine (LAMICTAL) 25 MG tablet   E-Prescribing Status: Receipt confirmed by pharmacy (05/23/2019 3:21 PM EDT)   Associated Diagnoses  Bipolar 1 disorder, mixed, moderate ([place]) - [place], [place]

## 2019-08-21 NOTE — Telephone Encounter (Signed)
Sent Lamictal to pharmacy 

## 2019-08-30 ENCOUNTER — Other Ambulatory Visit: Payer: Self-pay

## 2019-08-30 ENCOUNTER — Ambulatory Visit (INDEPENDENT_AMBULATORY_CARE_PROVIDER_SITE_OTHER): Payer: 59 | Admitting: Psychiatry

## 2019-08-30 ENCOUNTER — Encounter: Payer: Self-pay | Admitting: Psychiatry

## 2019-08-30 DIAGNOSIS — F4310 Post-traumatic stress disorder, unspecified: Secondary | ICD-10-CM

## 2019-08-30 DIAGNOSIS — E1165 Type 2 diabetes mellitus with hyperglycemia: Secondary | ICD-10-CM

## 2019-08-30 DIAGNOSIS — F17200 Nicotine dependence, unspecified, uncomplicated: Secondary | ICD-10-CM

## 2019-08-30 DIAGNOSIS — F3162 Bipolar disorder, current episode mixed, moderate: Secondary | ICD-10-CM

## 2019-08-30 DIAGNOSIS — E1151 Type 2 diabetes mellitus with diabetic peripheral angiopathy without gangrene: Secondary | ICD-10-CM

## 2019-08-30 DIAGNOSIS — I70209 Unspecified atherosclerosis of native arteries of extremities, unspecified extremity: Secondary | ICD-10-CM

## 2019-08-30 MED ORDER — LAMOTRIGINE 100 MG PO TABS: 100.0000 mg | ORAL_TABLET | Freq: Every day | ORAL | 0 refills | Status: DC

## 2019-08-30 NOTE — Progress Notes (Signed)
Virtual Visit via Telephone Note  I connected with Debra [name] on 08/30/19 at  2:00 PM EDT by telephone and verified that I am speaking with the correct person using two identifiers.   I discussed the limitations, risks, security and privacy concerns of performing an evaluation and management service by telephone and the availability of in person appointments. I also discussed with the patient that there may be a patient responsible charge related to this service. The patient expressed understanding and agreed to proceed.    I discussed the assessment and treatment plan with the patient. The patient was provided an opportunity to ask questions and all were answered. The patient agreed with the plan and demonstrated an understanding of the instructions.   The patient was advised to call back or seek an in-person evaluation if the symptoms worsen or if the condition fails to improve as anticipated.   Leslie MD OP Progress Note  08/30/2019 6:05 PM AZZURE GARABEDIAN  MRN:  [id number]  Chief Complaint:  Chief Complaint    Follow-up     HPI: Debra [name] is a 58 year old Caucasian female, married, disability, lives in [place] has a history of bipolar disorder, PTSD, tobacco use disorder, parkinsonian tremors, chronic back pain, OSA, hypertension, possible REM sleep behavior disorder was evaluated by telemedicine today.  Patient today reports she has noticed some recent mood lability.  She reports she has been having sadness as well as crying spells.  She also struggles with anxiety about the current situation.  She continues to be compliant with lamotrigine as well as Paxil.  She denies any side effects to these medications.  Patient reports sleep is good.  She continues to use CPAP.  She also uses melatonin.  Patient reports there has been some recent psychosocial stressors.  The lady who bought the house with her and her husband is not paying her share of the mortgage anymore.  Patient reports that has been a  big burden on her and her husband.  She hence has been having some financial issues.  She is hoping it will get better next month.  Discussed with patient to continue to work with her therapist.  Also discussed with her that her lamotrigine can be readjusted.  Patient denies any suicidality, homicidality or perceptual disturbances. Visit Diagnosis:    ICD-10-CM   1. Bipolar 1 disorder, mixed, moderate (HCC)  F31.62 lamoTRIgine (LAMICTAL) 100 MG tablet  2. PTSD (post-traumatic stress disorder)  F43.10   3. Tobacco use disorder  F17.200     Past Psychiatric History: Reviewed past psychiatric history from my progress note on 02/16/2019.  Past trials of Paxil, Depakote, Haldol, trazodone, melatonin  Past Medical History:  Past Medical History:  Diagnosis Date  . Anxiety   . Arthritis   . C. difficile colitis   . C. difficile colitis   . Depression   . Diabetes mellitus without complication ([place])    Type II  . Diabetes mellitus, type II ([place])   . Family history of adverse reaction to anesthesia    Mother - BP drops  . Head injury, closed, with brief LOC ([place]) 2011ish  . Hyperlipidemia   . Hypertension   . Insomnia   . Myalgia 03/29/2018   Due to statin  . Obesity   . Parkinson's disease ([place])   . Seasonal allergies   . Seizures ([place])   . Tobacco abuse     Past Surgical History:  Procedure Laterality Date  . ABDOMINAL HYSTERECTOMY  2012  .  BREAST BIOPSY Right   . CESAREAN SECTION  1992  . CHOLECYSTECTOMY  2013  . LUMBAR LAMINECTOMY/DECOMPRESSION MICRODISCECTOMY Left 09/10/2015   Procedure: Left L4-5 Foraminotomy/Left L5-S1 Diskectomy;  Surgeon: Leeroy Cha, MD;  Location: [facility];  Service: Neurosurgery;  Laterality: Left;  Left L4-5 Foraminotomy/Left L5-S1 Diskectomy    Family Psychiatric History: Reviewed family psychiatric history from my progress note on 02/16/2019  Family History:  Family History  Problem Relation Age of Onset  . COPD Mother   . Bipolar  disorder Father   . Diabetes Mellitus II Father   . Bipolar disorder Brother   . Diabetes Mellitus II Brother   . Bipolar disorder Brother     Social History: Reviewed social history from my progress note on 02/16/2019 Social History   Socioeconomic History  . Marital status: Married    Spouse name: [name]  . Number of children: 1  . Years of education: Not on file  . Highest education level: Associate degree: occupational, Hotel manager, or vocational program  Occupational History  . Occupation: Disabled  Social Needs  . Financial resource strain: Not hard at all  . Food insecurity    Worry: Sometimes true    Inability: Never true  . Transportation needs    Medical: No    Non-medical: No  Tobacco Use  . Smoking status: Current Every Day Smoker    Packs/day: 0.50    Years: 24.00    Pack years: 12.00    Types: Cigarettes    Start date: 03/19/2004  . Smokeless tobacco: Never Used  . Tobacco comment: states she wants to quit after covid-19 pandemic  Substance and Sexual Activity  . Alcohol use: Yes    Alcohol/week: 0.0 standard drinks    Comment: occasional- rare  . Drug use: No  . Sexual activity: Not Currently  Lifestyle  . Physical activity    Days per week: 0 days    Minutes per session: 0 min  . Stress: To some extent  Relationships  . Social connections    Talks on phone: More than three times a week    Gets together: Twice a week    Attends religious service: Never    Active member of club or organization: No    Attends meetings of clubs or organizations: Never    Relationship status: Married  Other Topics Concern  . Not on file  Social History Narrative   Live in private residence w/ husband and their friend who is wheelchair bound, her end of the house is wheelchair accessible.     Allergies:  Allergies  Allergen Reactions  . Clindamycin/Lincomycin     Pt states caused C-Diff  . Metformin And Related Diarrhea  . Benadryl [Diphenhydramine Hcl] Swelling     blistering  . Canagliflozin Other (See Comments)    Other reaction(s): Other (See Comments) Low bp Low bp Low bp  . Depakote Er [Divalproex Sodium Er] Other (See Comments)    seizures  . Dilaudid [Hydromorphone Hcl] Other (See Comments)    seizures  . Diphenhydramine-Zinc Acetate Other (See Comments)  . Erythromycin   . Ethanol   . Haloperidol Other (See Comments)  . Hydromorphone Other (See Comments)  . Neosporin [Neomycin-Bacitracin Zn-Polymyx] Swelling    blistering  . Singulair [Montelukast Sodium] Other (See Comments)    Asthma attacks  . Sitagliptin Other (See Comments)  . Statins Other (See Comments)    Other reaction(s): Unknown  . Sulfa Antibiotics Hives    Other reaction(s): Unknown  .  Victoza [Liraglutide] Other (See Comments) and Nausea And Vomiting    Other reaction(s): Other (See Comments) pancreatitis pancreatitis    Metabolic Disorder Labs: Lab Results  Component Value Date   HGBA1C 9.1 (H) 06/16/2019   MPG 214 06/16/2019   MPG 186 11/18/2018   No results found for: PROLACTIN Lab Results  Component Value Date   CHOL 120 06/16/2019   TRIG 107 06/16/2019   HDL 52 06/16/2019   CHOLHDL 2.3 06/16/2019   LDLCALC 49 06/16/2019   LDLCALC 147 (H) 11/18/2018   Lab Results  Component Value Date   TSH 1.671 02/16/2019   TSH 2.160 08/20/2015    Therapeutic Level Labs: No results found for: LITHIUM No results found for: VALPROATE No components found for:  CBMZ  Current Medications: Current Outpatient Medications  Medication Sig Dispense Refill  . aspirin EC 81 MG tablet Take 81 mg by mouth daily.    . blood glucose meter kit and supplies Use up to four times daily as directed, Dx E11.65, LON 99 months 1 each 0  . calcium citrate-vitamin D (CITRACAL+D) 315-200 MG-UNIT tablet Take 1 tablet by mouth 2 (two) times daily.    . Cholecalciferol 5000 units capsule Take 5,000 Units by mouth.     . clonazePAM (KLONOPIN) 0.5 MG tablet Take by mouth.    .  Evolocumab (REPATHA SURECLICK) 841 MG/ML SOAJ Inject 140 mg into the skin every 14 (fourteen) days. 2 pen 12  . FARXIGA 10 MG TABS tablet TAKE 1 TABLET DAILY (HIGHER DOSE) 90 tablet 3  . folic acid (FOLVITE) 324 MCG tablet Take 800 mcg by mouth daily.     [name] gabapentin (NEURONTIN) 100 MG capsule Take 1 capsule (100 mg total) by mouth 3 (three) times daily. 90 capsule 3  . glucose blood (ONE TOUCH ULTRA TEST) test strip USE UP TO FOUR TIMES A DAY AS DIRECTED; Dx 11.51, LON 99 months 300 each 3  . HUMALOG KWIKPEN 100 UNIT/ML KwikPen Give subcutaneously with meals, 20 units in the AM and 35 units with lunch and evening meal 27 pen 5  . hydrochlorothiazide (HYDRODIURIL) 25 MG tablet TAKE 1 TABLET DAILY (DOSE INCREASE, REPLACES ALL OTHER HCTZ PRESCRIPTIONS) 90 tablet 3  . Insulin Pen Needle 29G X 12.7MM MISC 1 each by Does not apply route as needed. 100 each 3  . lamoTRIgine (LAMICTAL) 100 MG tablet Take 1 tablet (100 mg total) by mouth daily. 90 tablet 0  . Lancets (ONETOUCH DELICA PLUS [id number]) MISC CHECK FINGER STICK BLOOD SUGARS FOUR TIMES A DAY 300 each 3  . lisinopril (PRINIVIL,ZESTRIL) 40 MG tablet Take 1 tablet (40 mg total) by mouth daily. 90 tablet 3  . Melatonin 10 MG TABS Take 1 tablet by mouth at bedtime.    . Multiple Vitamins-Minerals (CENTRUM WOMEN) TABS Take 1 tablet by mouth.    [name] PARoxetine (PAXIL) 40 MG tablet TAKE 1 TABLET EVERY MORNING 90 tablet 1  . pioglitazone (ACTOS) 30 MG tablet Take 1 tablet (30 mg total) by mouth daily. 90 tablet 3  . propranolol ER (INDERAL LA) 80 MG 24 hr capsule TAKE 1 CAPSULE (80 MG TOTAL) BY MOUTH ONCE DAILY  3  . TRESIBA FLEXTOUCH 200 UNIT/ML SOPN Inject 120 Units into the skin at bedtime. 36 pen 5   No current facility-administered medications for this visit.      Musculoskeletal: Strength & Muscle Tone: UTA Gait & Station: UTA Patient leans: N/A  Psychiatric Specialty Exam: Review of Systems  Psychiatric/Behavioral: Positive for  depression. The patient is nervous/anxious.   All other systems reviewed and are negative.   There were no vitals taken for this visit.There is no height or weight on file to calculate BMI.  General Appearance: UTA  Eye Contact:  UTA  Speech:  UTA  Volume:  Normal  Mood:  Anxious and Depressed  Affect:  Tearful  Thought Process:  Goal Directed and Descriptions of Associations: Intact  Orientation:  Full (Time, Place, and Person)  Thought Content: Logical   Suicidal Thoughts:  No  Homicidal Thoughts:  No  Memory:  Immediate;   Fair Recent;   Fair Remote;   Fair  Judgement:  Fair  Insight:  Fair  Psychomotor Activity:  Tremor  Concentration:  Concentration: Fair and Attention Span: Fair  Recall:  [employer] of Knowledge: Fair  Language: Fair  Akathisia:  No  Handed:  Right  AIMS (if indicated): Does have tremors -of hands  Assets:  Communication Skills Desire for Improvement Housing Social Support  ADL's:  Intact  Cognition: WNL  Sleep:  Fair   Screenings: Mini-Mental     Office Visit from 09/21/2016 in [facility]  Total Score (max 30 points )  27    PHQ2-9     Office Visit from 06/13/2019 in [facility] Office Visit from 05/24/2019 in [facility] Office Visit from 03/20/2019 in [facility] Office Visit from 11/18/2018 in [facility] Office Visit from 08/19/2018 in [facility]  PHQ-2 Total Score  0  0  0  2  3  PHQ-9 Total Score  0  0  0  8  14       Assessment and Plan: Debra [name] is a 58 year old Caucasian female, married, disability, lives in [place], has a history of bipolar disorder, PTSD, hypertension, diabetes melitis, OSA on CPAP, chronic pain was evaluated by telemedicine today.  Patient is biologically predisposed given her history of trauma, family history of mental health problems.  She also has psychosocial stressors of the pandemic as well as  financial problems.  Patient will benefit from medication readjustment for mood swings.  She will also continue to benefit from psychotherapy sessions.  Plan Bipolar disorder-unstable Increase lamotrigine to 100 mg p.o. daily Paxil 40 mg p.o. daily  PTSD-improving Paxil as prescribed Continue psychotherapy sessions.  Insomnia-improving Melatonin as prescribed by neurology Continue CPAP for OSA.  For tobacco use disorder-unstable She is currently is trying to cut back.  Provided smoking cessation counseling.  Follow-up in clinic in 1 month or sooner if needed.  October 28 at 11:30 AM  I have spent atleast 15 minutes non face to face with patient today. More than 50 % of the time was spent for psychoeducation and supportive psychotherapy and care coordination. This note was generated in part or whole with voice recognition software. Voice recognition is usually quite accurate but there are transcription errors that can and very often do occur. I apologize for any typographical errors that were not detected and corrected.        Ursula Alert, MD 08/30/2019, 6:05 PM

## 2019-09-05 ENCOUNTER — Ambulatory Visit (INDEPENDENT_AMBULATORY_CARE_PROVIDER_SITE_OTHER): Payer: Managed Care, Other (non HMO) | Admitting: Licensed Clinical Social Worker

## 2019-09-05 ENCOUNTER — Encounter: Payer: Self-pay | Admitting: Licensed Clinical Social Worker

## 2019-09-05 ENCOUNTER — Other Ambulatory Visit: Payer: Self-pay

## 2019-09-05 DIAGNOSIS — F3162 Bipolar disorder, current episode mixed, moderate: Secondary | ICD-10-CM | POA: Diagnosis not present

## 2019-09-05 NOTE — Progress Notes (Signed)
Virtual Visit via Telephone Note  I connected with Debra [name] on 09/05/19 at  8:00 AM EDT by telephone and verified that I am speaking with the correct person using two identifiers.   I discussed the limitations, risks, security and privacy concerns of performing an evaluation and management service by telephone and the availability of in person appointments. I also discussed with the patient that there may be a patient responsible charge related to this service. The patient expressed understanding and agreed to proceed.  I discussed the assessment and treatment plan with the patient. The patient was provided an opportunity to ask questions and all were answered. The patient agreed with the plan and demonstrated an understanding of the instructions.   The patient was advised to call back or seek an in-person evaluation if the symptoms worsen or if the condition fails to improve as anticipated.  I provided 45 minutes of non-face-to-face time during this encounter.   Alden Hipp, LCSW    THERAPIST PROGRESS NOTE  Session Time: 0800  Participation Level: Active  Behavioral Response: NeatAlertDepressed  Type of Therapy: Individual Therapy  Treatment Goals addressed: Anxiety  Interventions: Supportive  Summary: Debra [name] is a 58 y.o. female who presents with continued symptoms related to her diagnosis. Debra [name] re[ports doing well since our last session, but noted some stress related to her living situation. Debra [name] reported, "the woman who bought the house with [place] is in so much credit card debt that she is no longer able to contribute to bills." LCSW validated Suetta's feelings around the situation, and brainstormed ideas on how she could move forward. Debra [name] was receptive to ideas presented by [employer] LCSW suggested Debra [name] focus on the aspects of the situation she has control over, while reinforcing the idea she can only do what she can do. Debra [name] expressed understanding and agreement with the information  presented. Debra [name] reported having a nice visit with her mother in [place], and was glad she went. She stated being happy she did not get into any arguments with her daughter during that time. LCSW encouraged Debra [name] to store that memory as evidence to utilize later that everything is not as bad as we predict it will be. Debra [name] expressed understanding and agreement with that information as well.   Suicidal/Homicidal: No  Therapist Response: Debra [name] continues to work towards her tx goals but has not yet reached them. We will continue to work on emotional regulation skills moving forward.   Plan: Return again in 4 weeks.  Diagnosis: Axis I: Bipolar    Axis II: No diagnosis    Alden Hipp, LCSW 09/05/2019

## 2019-09-06 ENCOUNTER — Ambulatory Visit: Payer: Medicare Other | Admitting: Orthotics

## 2019-09-06 DIAGNOSIS — M2042 Other hammer toe(s) (acquired), left foot: Secondary | ICD-10-CM

## 2019-09-06 DIAGNOSIS — E0843 Diabetes mellitus due to underlying condition with diabetic autonomic (poly)neuropathy: Secondary | ICD-10-CM

## 2019-09-06 DIAGNOSIS — G629 Polyneuropathy, unspecified: Secondary | ICD-10-CM

## 2019-09-06 NOTE — Progress Notes (Signed)

## 2019-09-07 ENCOUNTER — Other Ambulatory Visit: Payer: Self-pay

## 2019-09-07 ENCOUNTER — Encounter: Payer: Self-pay | Admitting: Family Medicine

## 2019-09-07 ENCOUNTER — Ambulatory Visit (INDEPENDENT_AMBULATORY_CARE_PROVIDER_SITE_OTHER): Payer: Managed Care, Other (non HMO)

## 2019-09-07 ENCOUNTER — Telehealth: Payer: Self-pay | Admitting: Family Medicine

## 2019-09-07 ENCOUNTER — Ambulatory Visit (INDEPENDENT_AMBULATORY_CARE_PROVIDER_SITE_OTHER): Payer: Managed Care, Other (non HMO) | Admitting: Family Medicine

## 2019-09-07 DIAGNOSIS — E1165 Type 2 diabetes mellitus with hyperglycemia: Secondary | ICD-10-CM | POA: Diagnosis not present

## 2019-09-07 DIAGNOSIS — Z23 Encounter for immunization: Secondary | ICD-10-CM

## 2019-09-07 DIAGNOSIS — I70209 Unspecified atherosclerosis of native arteries of extremities, unspecified extremity: Secondary | ICD-10-CM

## 2019-09-07 DIAGNOSIS — E1151 Type 2 diabetes mellitus with diabetic peripheral angiopathy without gangrene: Secondary | ICD-10-CM

## 2019-09-07 DIAGNOSIS — B373 Candidiasis of vulva and vagina: Secondary | ICD-10-CM

## 2019-09-07 DIAGNOSIS — IMO0002 Reserved for concepts with insufficient information to code with codable children: Secondary | ICD-10-CM

## 2019-09-07 DIAGNOSIS — B3731 Acute candidiasis of vulva and vagina: Secondary | ICD-10-CM

## 2019-09-07 MED ORDER — FLUCONAZOLE 150 MG PO TABS: ORAL_TABLET | ORAL | 0 refills | Status: DC

## 2019-09-07 NOTE — Progress Notes (Signed)
Name: Debra [name]   MRN: [id number]    DOB: 11-15-1961   Date:09/07/2019       Progress Note  Subjective  Chief Complaint  Chief Complaint  Patient presents with  . Vaginitis    white discharge and white,  onset several days.  Has already tried OTC    I connected with  [name] on 09/07/19 at  2:20 PM EDT by telephone and verified that I am speaking with the correct person using two identifiers.   I discussed the limitations, risks, security and privacy concerns of performing an evaluation and management service by telephone and the availability of in person appointments. Staff also discussed with the patient that there may be a patient responsible charge related to this service. Patient Location: Home Provider Location: Office Additional Individuals present: None  HPI  Pt presents with 3 weeks of vaginal itching; then today she started to have white, clumpy discharge.  She denies vaginal pain/abdominal, no dysuria, fevers/chills.  DM: She has been seeing endocrinology, last A1C is still uncontrolled.  She has since switched to QAM tresiba, and this has helped to reduce her glucose throughout the day.  FBS today was 90.  Patient Active Problem List   Diagnosis Date Noted  . Bipolar 1 disorder, mixed, moderate ([place]) 08/30/2019  . PTSD (post-traumatic stress disorder) 08/30/2019  . Tobacco use disorder 08/30/2019  . Obstructive sleep apnea 11/18/2018  . Parkinsonian tremor ([place]) 11/18/2018  . RBD (REM behavioral disorder) 05/19/2018  . Morbid obesity ([place]) 05/03/2018  . Osteopenia 03/16/2018  . Abdominal aortic atherosclerosis ([place]) 03/04/2018  . Adverse reaction to statin medication 03/04/2018  . Uncontrolled diabetes mellitus type 2 with atherosclerosis of arteries of extremities ([place]) 03/04/2018  . Hyperlipidemia 04/20/2017  . Lumbar disc disease with radiculopathy 08/20/2015  . Lumbar herniated disc 08/20/2015  . Left-sided low back pain with sciatica 06/03/2015  .  Bipolar disorder ([place]) 06/03/2015  . Insomnia, persistent 06/03/2015  . BP (high blood pressure) 06/03/2015  . Compulsive tobacco user syndrome 06/03/2015  . Phlebectasia 06/03/2015    Social History   Tobacco Use  . Smoking status: Current Every Day Smoker    Packs/day: 0.50    Years: 24.00    Pack years: 12.00    Types: Cigarettes    Start date: 03/19/2004  . Smokeless tobacco: Never Used  . Tobacco comment: states she wants to quit after covid-19 pandemic  Substance Use Topics  . Alcohol use: Yes    Alcohol/week: 0.0 standard drinks    Comment: occasional- rare     Current Outpatient Medications:  .  aspirin EC 81 MG tablet, Take 81 mg by mouth daily., Disp: , Rfl:  .  blood glucose meter kit and supplies, Use up to four times daily as directed, Dx E11.65, LON 99 months, Disp: 1 each, Rfl: 0 .  calcium citrate-vitamin D (CITRACAL+D) 315-200 MG-UNIT tablet, Take 1 tablet by mouth 2 (two) times daily., Disp: , Rfl:  .  Cholecalciferol 5000 units capsule, Take 5,000 Units by mouth. , Disp: , Rfl:  .  clonazePAM (KLONOPIN) 0.5 MG tablet, Take by mouth., Disp: , Rfl:  .  Evolocumab (REPATHA SURECLICK) 859 MG/ML SOAJ, Inject 140 mg into the skin every 14 (fourteen) days., Disp: 2 pen, Rfl: 12 .  FARXIGA 10 MG TABS tablet, TAKE 1 TABLET DAILY (HIGHER DOSE), Disp: 90 tablet, Rfl: 3 .  folic acid (FOLVITE) 093 MCG tablet, Take 800 mcg by mouth daily. , Disp: ,  Rfl:  .  gabapentin (NEURONTIN) 100 MG capsule, Take 1 capsule (100 mg total) by mouth 3 (three) times daily., Disp: 90 capsule, Rfl: 3 .  glucose blood (ONE TOUCH ULTRA TEST) test strip, USE UP TO FOUR TIMES A DAY AS DIRECTED; Dx 11.51, LON 99 months, Disp: 300 each, Rfl: 3 .  HUMALOG KWIKPEN 100 UNIT/ML KwikPen, Give subcutaneously with meals, 20 units in the AM and 35 units with lunch and evening meal, Disp: 27 pen, Rfl: 5 .  hydrochlorothiazide (HYDRODIURIL) 25 MG tablet, TAKE 1 TABLET DAILY (DOSE INCREASE, REPLACES ALL OTHER  HCTZ PRESCRIPTIONS), Disp: 90 tablet, Rfl: 3 .  Insulin Pen Needle 29G X 12.7MM MISC, 1 each by Does not apply route as needed., Disp: 100 each, Rfl: 3 .  lamoTRIgine (LAMICTAL) 100 MG tablet, Take 1 tablet (100 mg total) by mouth daily., Disp: 90 tablet, Rfl: 0 .  Lancets (ONETOUCH DELICA PLUS [id number]) MISC, CHECK FINGER STICK BLOOD SUGARS FOUR TIMES A DAY, Disp: 300 each, Rfl: 3 .  lisinopril (PRINIVIL,ZESTRIL) 40 MG tablet, Take 1 tablet (40 mg total) by mouth daily., Disp: 90 tablet, Rfl: 3 .  Melatonin 10 MG TABS, Take 1 tablet by mouth at bedtime., Disp: , Rfl:  .  Multiple Vitamins-Minerals (CENTRUM WOMEN) TABS, Take 1 tablet by mouth., Disp: , Rfl:  .  PARoxetine (PAXIL) 40 MG tablet, TAKE 1 TABLET EVERY MORNING, Disp: 90 tablet, Rfl: 1 .  pioglitazone (ACTOS) 30 MG tablet, Take 1 tablet (30 mg total) by mouth daily., Disp: 90 tablet, Rfl: 3 .  propranolol ER (INDERAL LA) 80 MG 24 hr capsule, TAKE 1 CAPSULE (80 MG TOTAL) BY MOUTH ONCE DAILY, Disp: , Rfl: 3 .  TRESIBA FLEXTOUCH 200 UNIT/ML SOPN, Inject 120 Units into the skin at bedtime., Disp: 36 pen, Rfl: 5  Allergies  Allergen Reactions  . Clindamycin/Lincomycin     Pt states caused C-Diff  . Metformin And Related Diarrhea  . Benadryl [Diphenhydramine Hcl] Swelling    blistering  . Canagliflozin Other (See Comments)    Other reaction(s): Other (See Comments) Low bp Low bp Low bp  . Depakote Er [Divalproex Sodium Er] Other (See Comments)    seizures  . Dilaudid [Hydromorphone Hcl] Other (See Comments)    seizures  . Diphenhydramine-Zinc Acetate Other (See Comments)  . Erythromycin   . Ethanol   . Haloperidol Other (See Comments)  . Hydromorphone Other (See Comments)  . Neosporin [Neomycin-Bacitracin Zn-Polymyx] Swelling    blistering  . Singulair [Montelukast Sodium] Other (See Comments)    Asthma attacks  . Sitagliptin Other (See Comments)  . Statins Other (See Comments)    Other reaction(s): Unknown  . Sulfa  Antibiotics Hives    Other reaction(s): Unknown  . Victoza [Liraglutide] Other (See Comments) and Nausea And Vomiting    Other reaction(s): Other (See Comments) pancreatitis pancreatitis    I personally reviewed active problem list, medication list, allergies, notes from last encounter, lab results with the patient/caregiver today.  ROS  Ten systems reviewed and is negative except as mentioned in HPI   Objective  Virtual encounter, vitals not obtained.  There is no height or weight on file to calculate BMI.  Nursing Note and Vital Signs reviewed.  Physical Exam  Pulmonary/Chest: Effort normal. No respiratory distress. Speaking in complete sentences Neurological: Pt is alert and oriented to person, place, and time. Speech is normal. Psychiatric: Patient has a normal mood and affect. behavior is normal. Judgment and thought content normal.  No  results found for this or any previous visit (from the past 68 hour(s)).  Assessment & Plan  1. Uncontrolled diabetes mellitus type 2 with atherosclerosis of arteries of extremities (HCC) - fluconazole (DIFLUCAN) 150 MG tablet; Take 1 tablet once, may repeat in 48-72 hours if not improving.  Dispense: 2 tablet; Refill: 0  2. Vaginal candidiasis - fluconazole (DIFLUCAN) 150 MG tablet; Take 1 tablet once, may repeat in 48-72 hours if not improving.  Dispense: 2 tablet; Refill: 0  -Red flags and when to present for emergency care or RTC including fever >101.70F, chest pain, shortness of breath, new/worsening/un-resolving symptoms, reviewed with patient at time of visit. Follow up and care instructions discussed and provided in AVS. - I discussed the assessment and treatment plan with the patient. The patient was provided an opportunity to ask questions and all were answered. The patient agreed with the plan and demonstrated an understanding of the instructions.  - The patient was advised to call back or seek an in-person evaluation if the  symptoms worsen or if the condition fails to improve as anticipated.  I provided 12 minutes of non-face-to-face time during this encounter.  Hubbard Hartshorn, FNP

## 2019-09-07 NOTE — Telephone Encounter (Signed)
Pt came in today for flu shot and was needing an appt for she has a yeast infection and needs something. Please advise what the patient can do about appt. Thanks

## 2019-09-07 NOTE — Telephone Encounter (Signed)
Pt scheduled  

## 2019-09-11 ENCOUNTER — Encounter: Payer: Self-pay | Admitting: Podiatry

## 2019-09-11 ENCOUNTER — Other Ambulatory Visit: Payer: Self-pay

## 2019-09-11 ENCOUNTER — Ambulatory Visit (INDEPENDENT_AMBULATORY_CARE_PROVIDER_SITE_OTHER): Payer: Managed Care, Other (non HMO) | Admitting: Podiatry

## 2019-09-11 DIAGNOSIS — L608 Other nail disorders: Secondary | ICD-10-CM | POA: Insufficient documentation

## 2019-09-11 DIAGNOSIS — M79675 Pain in left toe(s): Secondary | ICD-10-CM

## 2019-09-11 DIAGNOSIS — M79674 Pain in right toe(s): Secondary | ICD-10-CM | POA: Diagnosis not present

## 2019-09-11 DIAGNOSIS — B351 Tinea unguium: Secondary | ICD-10-CM | POA: Diagnosis not present

## 2019-09-11 NOTE — Progress Notes (Signed)
Complaint:  Visit Type: Patient returns to my office for continued preventative foot care services. Complaint: Patient states" my nails have grown long and thick and become painful to walk and wear shoes" Patient has been diagnosed with DM with neuropathy.   Patient has received her diabetic shoes.  . The patient presents for preventative foot care services.  Podiatric Exam: Vascular: dorsalis pedis and posterior tibial pulses are palpable bilateral. Capillary return is immediate. Temperature gradient is WNL. Skin turgor WNL  Sensorium: Normal Semmes Weinstein monofilament test. Normal tactile sensation bilaterally. Nail Exam: Patient has thickness and incurvation of second and third toenails right foot.   Ulcer Exam: There is no evidence of ulcer or pre-ulcerative changes or infection. Orthopedic Exam: Muscle tone and strength are WNL. No limitations in general ROM. No crepitus or effusions noted. Foot type and digits show no abnormalities. Bony prominences are unremarkable. Skin: No Porokeratosis. No infection or ulcers  Diagnosis:  Onychomycosis/Pincer Nails 2,3 right  ,  Pain in right toe,   Treatment & Plan Procedures and Treatment: Consent by patient was obtained for treatment procedures.   Debridement of mycotic and hypertrophic toenails, 1 through 5 bilateral and clearing of subungual debris. No ulceration, no infection noted.  Return Visit-Office Procedure: Patient instructed to return to the office for a follow up visit 10 weeks  for continued evaluation and treatment.    Gardiner Barefoot DPM

## 2019-09-13 ENCOUNTER — Other Ambulatory Visit: Payer: Medicare Other | Admitting: Orthotics

## 2019-09-15 IMAGING — CT CT ABD-PELV W/ CM
2 of 5 series · 16 of 46 positions shown, 18 images · IV contrast (APPLIED)
Comparison: CT of the abdomen and pelvis performed 05/29/2015, and
MRI of the lumbar spine performed 08/28/2015

CLINICAL DATA: Acute onset of upper abdominal pain, radiating to
the left lower quadrant. Nausea.

EXAM:
CT ABDOMEN AND PELVIS WITH CONTRAST
TECHNIQUE: Multidetector CT imaging of the abdomen and pelvis was performed
using the standard protocol following bolus administration of
intravenous contrast.
CONTRAST:  100mL K6UF9T-0CC IOPAMIDOL (K6UF9T-0CC) INJECTION 61%

[Series 2: routine abd/pel with · axial · 0.84mm/px · z∈[-972,-552]mm · 13 of 96 slices shown, 15 images]
[im 6/96  soft-tissue]
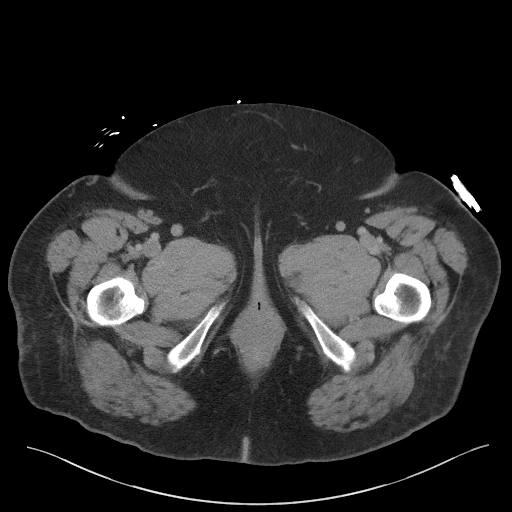
[im 6/96  bone]
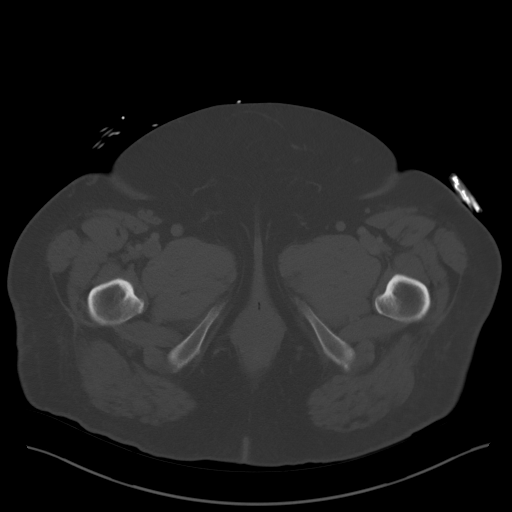
[im 11/96  soft-tissue]
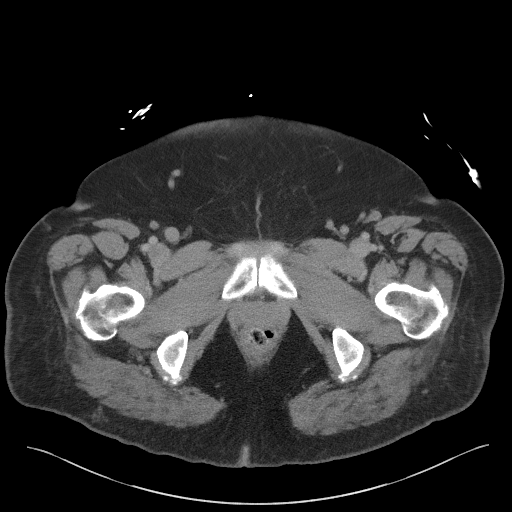
[im 22/96  soft-tissue]
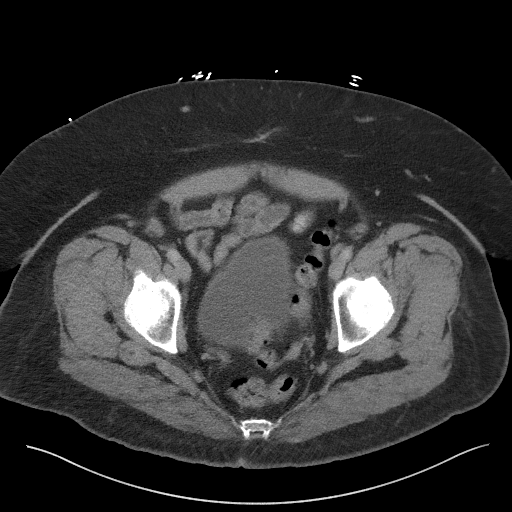
[im 27/96  soft-tissue]
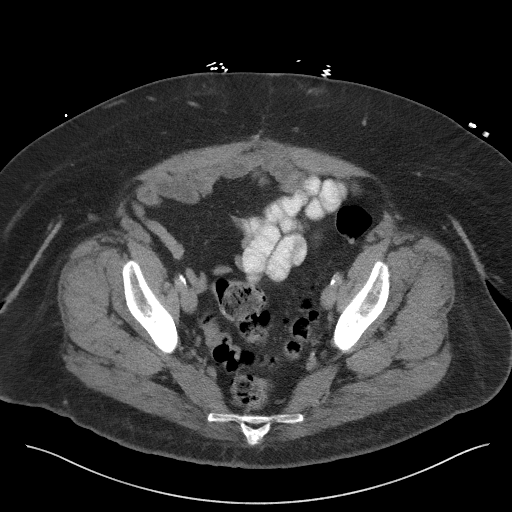
[im 32/96  soft-tissue]
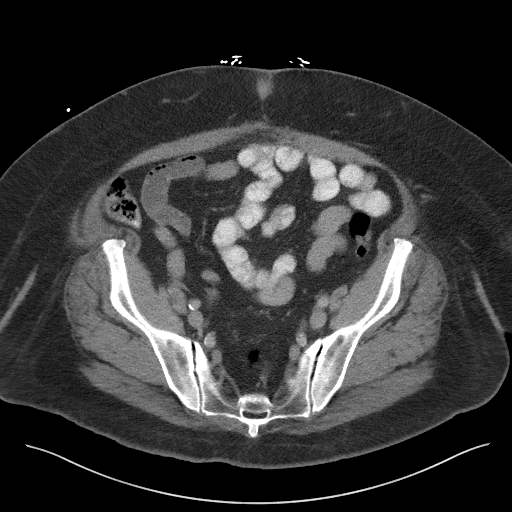
[im 43/96  soft-tissue]
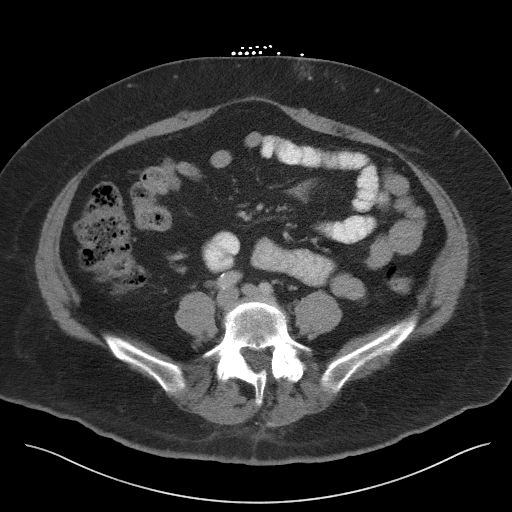
[im 48/96  soft-tissue]
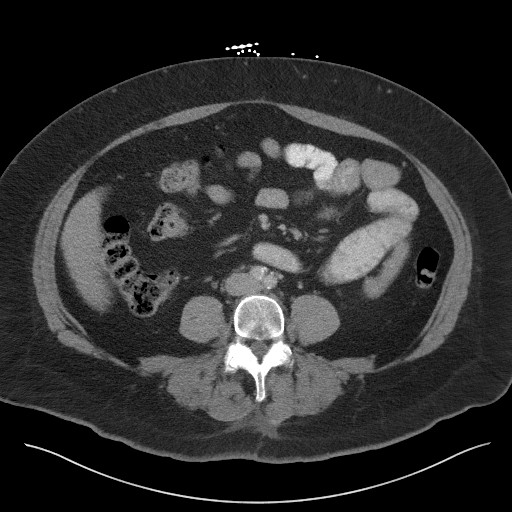
[im 53/96  soft-tissue]
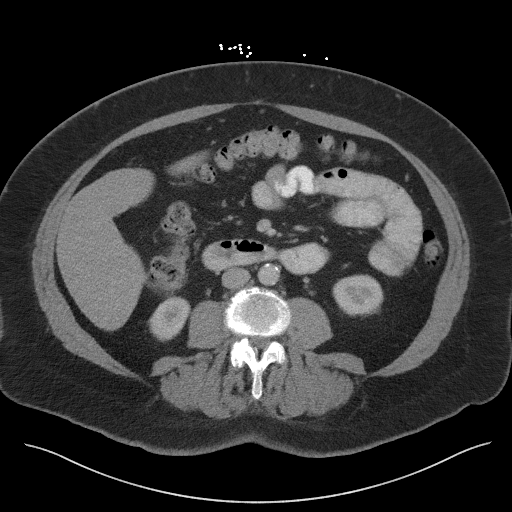
[im 64/96  soft-tissue]
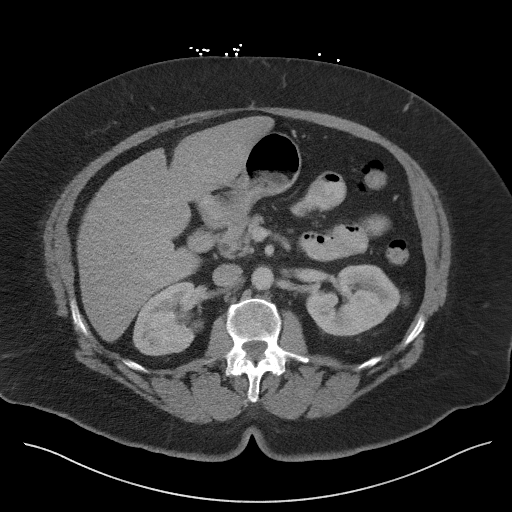
[im 64/96  bone]
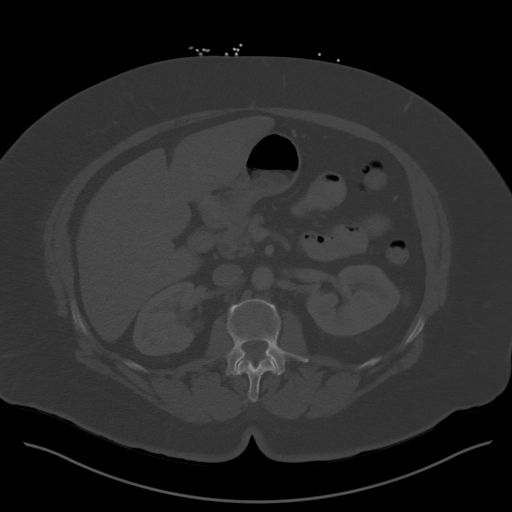
[im 69/96  soft-tissue]
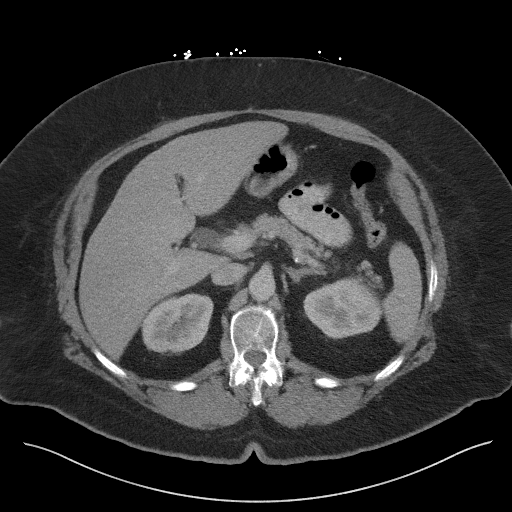
[im 74/96  soft-tissue]
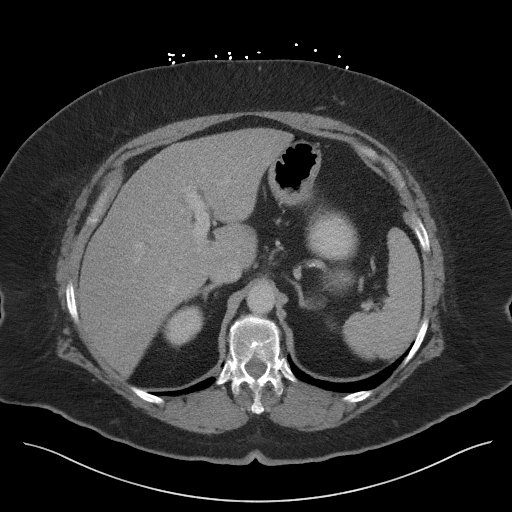
[im 85/96  soft-tissue]
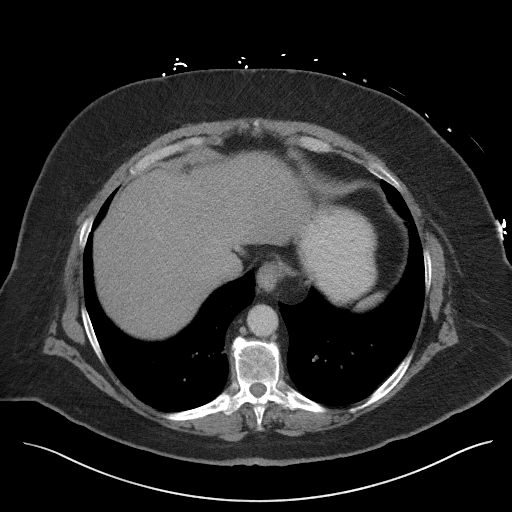
[im 90/96  soft-tissue]
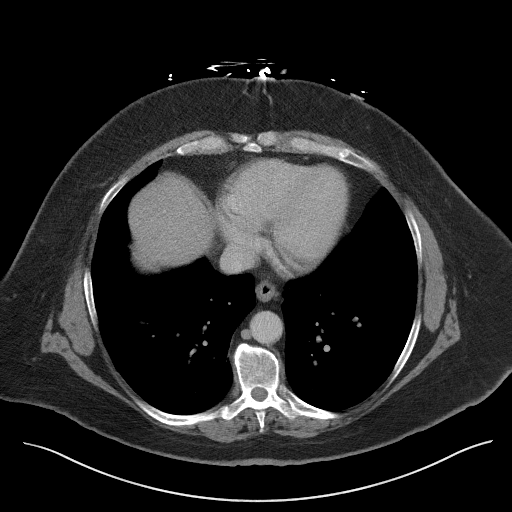

[Series 6: coronal st · coronal · 0.80mm/px · 3 of 101 slices shown]
[im 34/101  soft-tissue]
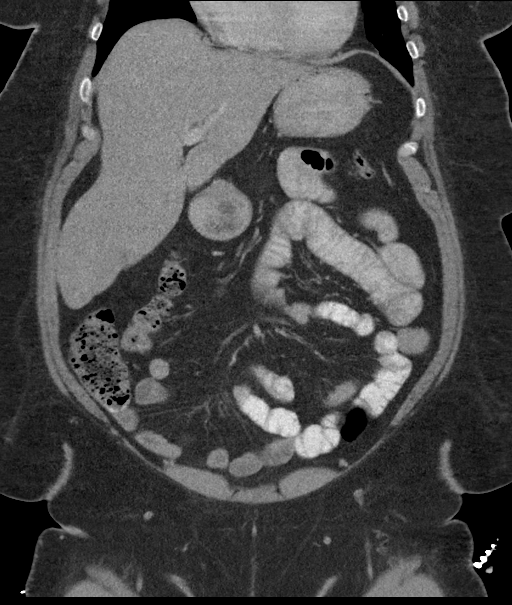
[im 45/101  soft-tissue]
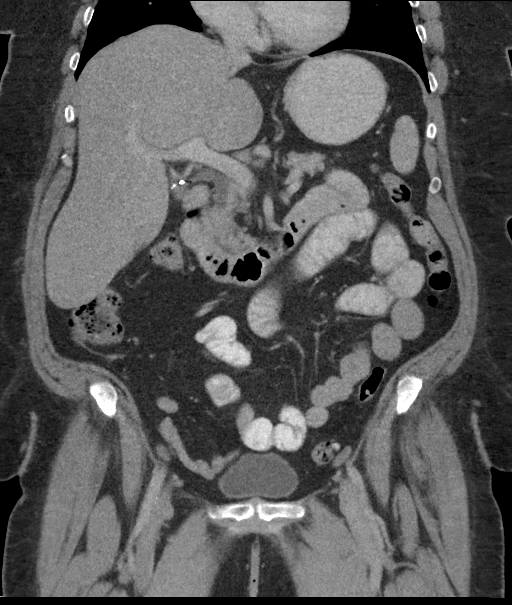
[im 56/101  soft-tissue]
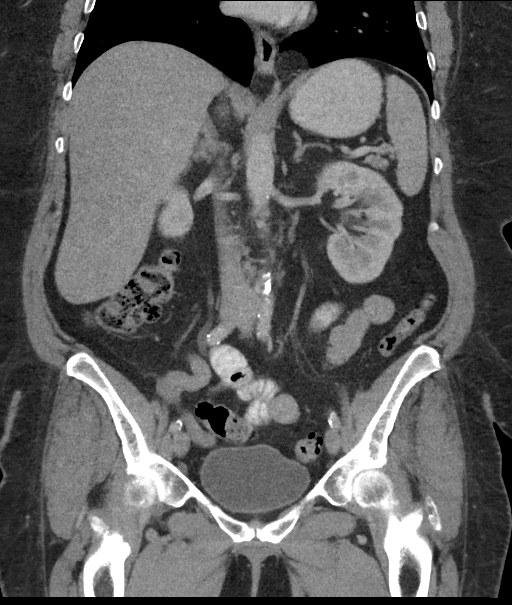

[16 of 46 positions shown; findings below may reference images not displayed]

FINDINGS: Lower chest: The visualized lung bases are grossly clear. The
visualized portions of the mediastinum are unremarkable.

Hepatobiliary: The liver is unremarkable in appearance. The patient
is status post cholecystectomy, with clips noted at the gallbladder
fossa. The common bile duct remains normal in caliber.

Pancreas: The pancreas is within normal limits.

Spleen: The spleen is unremarkable in appearance.

Adrenals/Urinary Tract: The adrenal glands are unremarkable in
appearance. The kidneys are within normal limits. There is no
evidence of hydronephrosis. No renal or ureteral stones are
identified. Mild perinephric stranding is noted bilaterally.

Stomach/Bowel: The stomach is unremarkable in appearance. The small
bowel is within normal limits. The appendix is normal in caliber,
without evidence of appendicitis. The colon is unremarkable in
appearance.

Vascular/Lymphatic: Scattered calcification is seen along the
abdominal aorta and its branches. The abdominal aorta is otherwise
grossly unremarkable. The inferior vena cava is grossly
unremarkable. No retroperitoneal lymphadenopathy is seen. No pelvic
sidewall lymphadenopathy is identified.

Reproductive: The bladder is mildly distended and grossly
unremarkable. The patient is status post hysterectomy. No suspicious
adnexal masses are seen.

Other: No additional soft tissue abnormalities are seen.

Musculoskeletal: No acute osseous abnormalities are identified. Mild
vacuum phenomenon is noted at the lower lumbar spine. The visualized
musculature is unremarkable in appearance.
IMPRESSION: No acute abnormality seen within the abdomen or pelvis.

Aortic Atherosclerosis ([id number].I).

## 2019-09-21 ENCOUNTER — Encounter: Payer: Self-pay | Admitting: Family Medicine

## 2019-09-21 ENCOUNTER — Other Ambulatory Visit: Payer: Self-pay

## 2019-09-21 ENCOUNTER — Ambulatory Visit (INDEPENDENT_AMBULATORY_CARE_PROVIDER_SITE_OTHER): Payer: Managed Care, Other (non HMO) | Admitting: Family Medicine

## 2019-09-21 VITALS — BP 148/64 | HR 73 | Temp 97.9°F | Resp 16 | Ht 66.0 in | Wt 282.6 lb

## 2019-09-21 DIAGNOSIS — Z5181 Encounter for therapeutic drug level monitoring: Secondary | ICD-10-CM

## 2019-09-21 DIAGNOSIS — E782 Mixed hyperlipidemia: Secondary | ICD-10-CM

## 2019-09-21 DIAGNOSIS — IMO0002 Reserved for concepts with insufficient information to code with codable children: Secondary | ICD-10-CM

## 2019-09-21 DIAGNOSIS — F17200 Nicotine dependence, unspecified, uncomplicated: Secondary | ICD-10-CM

## 2019-09-21 DIAGNOSIS — E162 Hypoglycemia, unspecified: Secondary | ICD-10-CM

## 2019-09-21 DIAGNOSIS — I70209 Unspecified atherosclerosis of native arteries of extremities, unspecified extremity: Secondary | ICD-10-CM

## 2019-09-21 DIAGNOSIS — E1165 Type 2 diabetes mellitus with hyperglycemia: Secondary | ICD-10-CM

## 2019-09-21 DIAGNOSIS — E1151 Type 2 diabetes mellitus with diabetic peripheral angiopathy without gangrene: Secondary | ICD-10-CM

## 2019-09-21 DIAGNOSIS — I10 Essential (primary) hypertension: Secondary | ICD-10-CM

## 2019-09-21 NOTE — Patient Instructions (Addendum)
If you're having lows under 70 daily, would decrease the treseba by 5-10 units for 3 days    Call your insurance and let me know if they cover Shingrix in office or at the pharmacy and we will order it for you or do it in office.   Zoster Vaccine, Recombinant injection What is this medicine? ZOSTER VACCINE (ZOS ter vak SEEN) is used to prevent shingles in adults 57 years old and over. This vaccine is not used to treat shingles or nerve pain from shingles. This medicine may be used for other purposes; ask your health care provider or pharmacist if you have questions. COMMON BRAND NAME(S): [facility] What should I tell my health care provider before I take this medicine? They need to know if you have any of these conditions:  blood disorders or disease  cancer like leukemia or lymphoma  immune system problems or therapy  an unusual or allergic reaction to vaccines, other medications, foods, dyes, or preservatives  pregnant or trying to get pregnant  breast-feeding How should I use this medicine? This vaccine is for injection in a muscle. It is given by a health care professional. Talk to your pediatrician regarding the use of this medicine in children. This medicine is not approved for use in children. Overdosage: If you think you have taken too much of this medicine contact a poison control center or emergency room at once. NOTE: This medicine is only for you. Do not share this medicine with others. What if I miss a dose? Keep appointments for follow-up (booster) doses as directed. It is important not to miss your dose. Call your doctor or health care professional if you are unable to keep an appointment. What may interact with this medicine?  medicines that suppress your immune system  medicines to treat cancer  steroid medicines like prednisone or cortisone This list may not describe all possible interactions. Give your health care provider a list of all the medicines, herbs,  non-prescription drugs, or dietary supplements you use. Also tell them if you smoke, drink alcohol, or use illegal drugs. Some items may interact with your medicine. What should I watch for while using this medicine? Visit your doctor for regular check ups. This vaccine, like all vaccines, may not fully protect everyone. What side effects may I notice from receiving this medicine? Side effects that you should report to your doctor or health care professional as soon as possible:  allergic reactions like skin rash, itching or hives, swelling of the face, lips, or tongue  breathing problems Side effects that usually do not require medical attention (report these to your doctor or health care professional if they continue or are bothersome):  chills  headache  fever  nausea, vomiting  redness, warmth, pain, swelling or itching at site where injected  tiredness This list may not describe all possible side effects. Call your doctor for medical advice about side effects. You may report side effects to FDA at 1-800-FDA-1088. Where should I keep my medicine? This vaccine is only given in a clinic, pharmacy, doctor's office, or other health care setting and will not be stored at home. NOTE: This sheet is a summary. It may not cover all possible information. If you have questions about this medicine, talk to your doctor, pharmacist, or health care provider.  2020 Elsevier/Gold Standard (2017-07-05 13:20:30)

## 2019-09-21 NOTE — Progress Notes (Signed)
Name: Debra [name]   MRN: [id number]    DOB: [birth year]   Date:09/21/2019       Progress Note  Chief Complaint  Patient presents with  . Follow-up  . Hypertension  . Diabetes    see's endo     Subjective:   Debra [name] is a 58 y.o. female, presents to clinic for routine follow up on the conditions listed above.   Hypertension:  Currently managed on lisinopril 40 mg and hydrochlorothiazide 25 mg Pt reports good med compliance and denies any SE.  No lightheadedness, hypotension, syncope. Blood pressure today is well controlled - pt a little anxious - repeated prior to pt leaving BP Readings from Last 3 Encounters:  09/21/19 (!) 148/64  06/13/19 137/87  06/13/19 137/87   Pt denies CP, SOB, exertional sx, LE edema, palpitation, Ha's, visual disturbances Dietary efforts for BP?  Healthier diet    DM-insulin-dependent diabetes managed by endocrinology, patient going to [facility] switched to the am she feels like overall her blood sugars over the past 3 months with better but she is having hypoglycemic episodes states that last night and several nights and sometimes in the afternoon she has had low blood sugars  120 units in the am, then mealtime insulin 20 units breakfast, 35 lunch and 35 supper-she has tried to cut back on carbs in her diet but she is not doing sliding scale just yet she would like to try and work on that. She states that before dinner CBG is usually around 135-155, not calculating carbs or doing SS Having lows in afternoons and evenings, last night it was low while she was up late doing laundry, didn't register a few times, got 42 as a reading once, she drank a normal soda and waited and CBG still did not register.  She called for her husband after she fell onto the bed he was able to get glucose tablets and she took those and it eventually came up.  She does have a warning on her phone which shows almost daily having lows.    Shingrix - pt wants, going to  check with insurance  The nurse noted that patient want to quit smoking due to Covid when I asked for this in the exam room she denied this and did not want to talk about it  HLD -  On injection with specialist - eating better   Patient Active Problem List   Diagnosis Date Noted  . Pain due to onychomycosis of toenails of both feet 09/11/2019  . Pincer nail deformity 09/11/2019  . Bipolar 1 disorder, mixed, moderate ([place]) 08/30/2019  . PTSD (post-traumatic stress disorder) 08/30/2019  . Tobacco use disorder 08/30/2019  . Obstructive sleep apnea 11/18/2018  . Parkinsonian tremor ([place]) 11/18/2018  . RBD (REM behavioral disorder) 05/19/2018  . Morbid obesity ([place]) 05/03/2018  . Osteopenia 03/16/2018  . Abdominal aortic atherosclerosis ([place]) 03/04/2018  . Adverse reaction to statin medication 03/04/2018  . Uncontrolled diabetes mellitus type 2 with atherosclerosis of arteries of extremities ([place]) 03/04/2018  . Hyperlipidemia 04/20/2017  . Lumbar disc disease with radiculopathy 08/20/2015  . Lumbar herniated disc 08/20/2015  . Left-sided low back pain with sciatica 06/03/2015  . Bipolar disorder ([place]) 06/03/2015  . Insomnia, persistent 06/03/2015  . BP (high blood pressure) 06/03/2015  . Compulsive tobacco user syndrome 06/03/2015  . Phlebectasia 06/03/2015    Past Surgical History:  Procedure Laterality Date  . ABDOMINAL HYSTERECTOMY  2012  . BREAST BIOPSY  Right   . CESAREAN SECTION  1992  . CHOLECYSTECTOMY  2013  . LUMBAR LAMINECTOMY/DECOMPRESSION MICRODISCECTOMY Left 09/10/2015   Procedure: Left L4-5 Foraminotomy/Left L5-S1 Diskectomy;  Surgeon: Leeroy Cha, MD;  Location: [facility];  Service: Neurosurgery;  Laterality: Left;  Left L4-5 Foraminotomy/Left L5-S1 Diskectomy    Family History  Problem Relation Age of Onset  . COPD Mother   . Bipolar disorder Father   . Diabetes Mellitus II Father   . Bipolar disorder Brother   . Diabetes Mellitus II Brother   .  Bipolar disorder Brother     Social History   Socioeconomic History  . Marital status: Married    Spouse name: [name]  . Number of children: 1  . Years of education: Not on file  . Highest education level: Associate degree: occupational, Hotel manager, or vocational program  Occupational History  . Occupation: Disabled  Social Needs  . Financial resource strain: Not hard at all  . Food insecurity    Worry: Sometimes true    Inability: Never true  . Transportation needs    Medical: No    Non-medical: No  Tobacco Use  . Smoking status: Current Every Day Smoker    Packs/day: 0.50    Years: 24.00    Pack years: 12.00    Types: Cigarettes    Start date: 03/19/2004  . Smokeless tobacco: Never Used  . Tobacco comment: states she wants to quit after covid-19 pandemic  Substance and Sexual Activity  . Alcohol use: Yes    Alcohol/week: 0.0 standard drinks    Comment: occasional- rare  . Drug use: No  . Sexual activity: Not Currently  Lifestyle  . Physical activity    Days per week: 0 days    Minutes per session: 0 min  . Stress: To some extent  Relationships  . Social connections    Talks on phone: More than three times a week    Gets together: Twice a week    Attends religious service: Never    Active member of club or organization: No    Attends meetings of clubs or organizations: Never    Relationship status: Married  . Intimate partner violence    Fear of current or ex partner: No    Emotionally abused: No    Physically abused: No    Forced sexual activity: No  Other Topics Concern  . Not on file  Social History Narrative   Live in private residence w/ husband and their friend who is wheelchair bound, her end of the house is wheelchair accessible.      Current Outpatient Medications:  .  aspirin EC 81 MG tablet, Take 81 mg by mouth daily., Disp: , Rfl:  .  calcium citrate-vitamin D (CITRACAL+D) 315-200 MG-UNIT tablet, Take 1 tablet by mouth 2 (two) times daily.,  Disp: , Rfl:  .  Evolocumab (REPATHA SURECLICK) 161 MG/ML SOAJ, Inject 140 mg into the skin every 14 (fourteen) days., Disp: 2 pen, Rfl: 12 .  FARXIGA 10 MG TABS tablet, TAKE 1 TABLET DAILY (HIGHER DOSE), Disp: 90 tablet, Rfl: 3 .  fluconazole (DIFLUCAN) 150 MG tablet, Take 1 tablet once, may repeat in 48-72 hours if not improving., Disp: 2 tablet, Rfl: 0 .  folic acid (FOLVITE) 096 MCG tablet, Take 800 mcg by mouth daily. , Disp: , Rfl:  .  HUMALOG KWIKPEN 100 UNIT/ML KwikPen, Give subcutaneously with meals, 20 units in the AM and 35 units with lunch and evening meal, Disp: 27  pen, Rfl: 5 .  hydrochlorothiazide (HYDRODIURIL) 25 MG tablet, TAKE 1 TABLET DAILY (DOSE INCREASE, REPLACES ALL OTHER HCTZ PRESCRIPTIONS), Disp: 90 tablet, Rfl: 3 .  lamoTRIgine (LAMICTAL) 100 MG tablet, Take 1 tablet (100 mg total) by mouth daily., Disp: 90 tablet, Rfl: 0 .  lisinopril (PRINIVIL,ZESTRIL) 40 MG tablet, Take 1 tablet (40 mg total) by mouth daily., Disp: 90 tablet, Rfl: 3 .  Melatonin 10 MG TABS, Take 1 tablet by mouth at bedtime., Disp: , Rfl:  .  Multiple Vitamins-Minerals (CENTRUM WOMEN) TABS, Take 1 tablet by mouth., Disp: , Rfl:  .  PARoxetine (PAXIL) 40 MG tablet, TAKE 1 TABLET EVERY MORNING, Disp: 90 tablet, Rfl: 1 .  pioglitazone (ACTOS) 30 MG tablet, Take 1 tablet (30 mg total) by mouth daily., Disp: 90 tablet, Rfl: 3 .  propranolol ER (INDERAL LA) 80 MG 24 hr capsule, TAKE 1 CAPSULE (80 MG TOTAL) BY MOUTH ONCE DAILY, Disp: , Rfl: 3 .  TRESIBA FLEXTOUCH 200 UNIT/ML SOPN, Inject 120 Units into the skin at bedtime. (Patient taking differently: Inject 120 Units into the skin every morning. ), Disp: 36 pen, Rfl: 5 .  blood glucose meter kit and supplies, Use up to four times daily as directed, Dx E11.65, LON 99 months, Disp: 1 each, Rfl: 0 .  Cholecalciferol 5000 units capsule, Take 5,000 Units by mouth. , Disp: , Rfl:  .  clonazePAM (KLONOPIN) 0.5 MG tablet, Take by mouth., Disp: , Rfl:  .  glucose blood  (ONE TOUCH ULTRA TEST) test strip, USE UP TO FOUR TIMES A DAY AS DIRECTED; Dx 11.51, LON 99 months, Disp: 300 each, Rfl: 3 .  Insulin Pen Needle 29G X 12.7MM MISC, 1 each by Does not apply route as needed., Disp: 100 each, Rfl: 3 .  Lancets (ONETOUCH DELICA PLUS PPIRJJ88C) MISC, CHECK FINGER STICK BLOOD SUGARS FOUR TIMES A DAY, Disp: 300 each, Rfl: 3  Allergies  Allergen Reactions  . Clindamycin/Lincomycin     Pt states caused C-Diff  . Metformin And Related Diarrhea  . Benadryl [Diphenhydramine Hcl] Swelling    blistering  . Canagliflozin Other (See Comments)    Other reaction(s): Other (See Comments) Low bp Low bp Low bp  . Depakote Er [Divalproex Sodium Er] Other (See Comments)    seizures  . Dilaudid [Hydromorphone Hcl] Other (See Comments)    seizures  . Diphenhydramine-Zinc Acetate Other (See Comments)  . Erythromycin   . Ethanol   . Haloperidol Other (See Comments)  . Hydromorphone Other (See Comments)  . Neosporin [Neomycin-Bacitracin Zn-Polymyx] Swelling    blistering  . Singulair [Montelukast Sodium] Other (See Comments)    Asthma attacks  . Sitagliptin Other (See Comments)  . Statins Other (See Comments)    Other reaction(s): Unknown  . Sulfa Antibiotics Hives    Other reaction(s): Unknown  . Victoza [Liraglutide] Other (See Comments) and Nausea And Vomiting    Other reaction(s): Other (See Comments) pancreatitis pancreatitis    I personally reviewed active problem list, medication list, allergies, family history, social history, health maintenance, notes from last encounter, lab results with the patient/caregiver today.  Review of Systems  Constitutional: Negative.   HENT: Negative.   Eyes: Negative.   Respiratory: Negative.   Cardiovascular: Negative.   Gastrointestinal: Negative.   Endocrine: Negative.   Genitourinary: Negative.   Musculoskeletal: Negative.   Skin: Negative.   Allergic/Immunologic: Negative.   Neurological: Negative.    Hematological: Negative.   Psychiatric/Behavioral: Negative.   All other systems reviewed and  are negative.    Objective:    Vitals:   09/21/19 1315  BP: (!) 148/64  Pulse: 73  Resp: 16  Temp: 97.9 F (36.6 C)  SpO2: 94%  Weight: 282 lb 9.6 oz (128.2 kg)  Height: '5\' 6"'  (1.676 m)    Body mass index is 45.61 kg/m.  Physical Exam   No results found for this or any previous visit (from the past 2160 hour(s)).  Diabetic Foot Exam: Diabetic Foot Exam - Simple   No data filed       PHQ2/9: Depression screen [facility] 2/9 09/07/2019 06/13/2019 05/24/2019 03/20/2019 11/18/2018  Decreased Interest 0 0 0 0 1  Down, Depressed, Hopeless 0 0 0 0 1  PHQ - 2 Score 0 0 0 0 2  Altered sleeping 0 0 0 0 2  Tired, decreased energy 0 0 0 0 2  Change in appetite 0 0 0 0 1  Feeling bad or failure about yourself  0 0 0 0 0  Trouble concentrating 0 0 0 0 1  Moving slowly or fidgety/restless 0 0 0 0 0  Suicidal thoughts 0 0 0 0 0  PHQ-9 Score 0 0 0 0 8  Difficult doing work/chores Not difficult at all Not difficult at all Not difficult at all Not difficult at all Somewhat difficult  Some recent data might be hidden    phq 9 is negative Reviewed - managed by psych with mood disorder and meds  Fall Risk: Fall Risk  09/07/2019 06/13/2019 05/24/2019 03/20/2019 11/18/2018  Falls in the past year? 0 0 0 0 0  Number falls in past yr: 0 0 0 0 -  Injury with Fall? 0 0 0 0 -  Comment - - - - -  Risk Factor Category  - - - - -  Comment - - - - -  Risk for fall due to : - - - - -  Risk for fall due to: Comment - - - - -  Follow up - - - - -       Assessment & Plan:       ICD-10-CM   1. Uncontrolled diabetes mellitus type 2 with atherosclerosis of arteries of extremities (HCC)  M42.68 COMPLETE METABOLIC PANEL WITH GFR   I70.209 Hemoglobin A1c   E11.65    some low episodes - pt instructed to decrease basal insulin by 5 units for 3 d, then 5 more if still having lows, f/up with endocrine, decrease  carbs in foods  2. Mixed hyperlipidemia  T41.9 COMPLETE METABOLIC PANEL WITH GFR    Lipid panel   per specialists, labs today  3. Essential hypertension  Q22 COMPLETE METABOLIC PANEL WITH GFR   Overall well controlled, will recheck labs continue same dose  4. Encounter for medication monitoring  Z51.81 CBC with Differential/Platelet    COMPLETE METABOLIC PANEL WITH GFR    Hemoglobin A1c    Lipid panel  5. Tobacco use disorder  F17.200    tobacco cessation discussed, pt not interested right now although she did mention to [place] [place] today when roomed     Return in about 3 months (around 12/22/2019).   Delsa Grana, PA-C 09/21/19 1:19 PM

## 2019-09-22 ENCOUNTER — Encounter: Payer: Self-pay | Admitting: Family Medicine

## 2019-09-22 LAB — CBC WITH DIFFERENTIAL/PLATELET
Absolute Monocytes: 785 cells/uL (ref 200–950)
Basophils Absolute: 62 cells/uL (ref 0–200)
Basophils Relative: 0.8 %
Eosinophils Absolute: 177 cells/uL (ref 15–500)
Eosinophils Relative: 2.3 %
HCT: 41.9 % (ref 35.0–45.0)
Hemoglobin: 14 g/dL (ref 11.7–15.5)
Lymphs Abs: 2202 cells/uL (ref 850–3900)
MCH: 32 pg (ref 27.0–33.0)
MCHC: 33.4 g/dL (ref 32.0–36.0)
MCV: 95.7 fL (ref 80.0–100.0)
MPV: 10 fL (ref 7.5–12.5)
Monocytes Relative: 10.2 %
Neutro Abs: 4474 cells/uL (ref 1500–7800)
Neutrophils Relative %: 58.1 %
Platelets: 271 10*3/uL (ref 140–400)
RBC: 4.38 10*6/uL (ref 3.80–5.10)
RDW: 12.7 % (ref 11.0–15.0)
Total Lymphocyte: 28.6 %
WBC: 7.7 10*3/uL (ref 3.8–10.8)

## 2019-09-22 LAB — COMPLETE METABOLIC PANEL WITH GFR
AG Ratio: 2 (calc) (ref 1.0–2.5)
ALT: 22 U/L (ref 6–29)
AST: 18 U/L (ref 10–35)
Albumin: 4.2 g/dL (ref 3.6–5.1)
Alkaline phosphatase (APISO): 74 U/L (ref 37–153)
BUN: 17 mg/dL (ref 7–25)
CO2: 30 mmol/L (ref 20–32)
Calcium: 9.8 mg/dL (ref 8.6–10.4)
Chloride: 104 mmol/L (ref 98–110)
Creat: 0.86 mg/dL (ref 0.50–1.05)
GFR, Est African American: 88 mL/min/{1.73_m2} (ref >=60)
GFR, Est Non African American: 76 mL/min/{1.73_m2} (ref >=60)
Globulin: 2.1 g/dL (calc) (ref 1.9–3.7)
Glucose, Bld: 176 mg/dL — ABNORMAL HIGH (ref 65–99)
Potassium: 3.8 mmol/L (ref 3.5–5.3)
Sodium: 142 mmol/L (ref 135–146)
Total Bilirubin: 0.5 mg/dL (ref 0.2–1.2)
Total Protein: 6.3 g/dL (ref 6.1–8.1)

## 2019-09-22 LAB — HEMOGLOBIN A1C
Hgb A1c MFr Bld: 8.3 % of total Hgb — ABNORMAL HIGH (ref <5.7)
Mean Plasma Glucose: 192 (calc)
eAG (mmol/L): 10.6 (calc)

## 2019-09-22 LAB — LIPID PANEL
Cholesterol: 98 mg/dL (ref <200)
HDL: 49 mg/dL — ABNORMAL LOW (ref >=50)
LDL Cholesterol (Calc): 24 mg/dL (calc)
Non-HDL Cholesterol (Calc): 49 mg/dL (calc) (ref <130)
Total CHOL/HDL Ratio: 2 (calc) (ref <5.0)
Triglycerides: 167 mg/dL — ABNORMAL HIGH (ref <150)

## 2019-10-04 ENCOUNTER — Ambulatory Visit (INDEPENDENT_AMBULATORY_CARE_PROVIDER_SITE_OTHER): Payer: 59 | Admitting: Psychiatry

## 2019-10-04 ENCOUNTER — Encounter: Payer: Self-pay | Admitting: Psychiatry

## 2019-10-04 ENCOUNTER — Other Ambulatory Visit: Payer: Self-pay

## 2019-10-04 DIAGNOSIS — E1151 Type 2 diabetes mellitus with diabetic peripheral angiopathy without gangrene: Secondary | ICD-10-CM

## 2019-10-04 DIAGNOSIS — E1165 Type 2 diabetes mellitus with hyperglycemia: Secondary | ICD-10-CM | POA: Diagnosis not present

## 2019-10-04 DIAGNOSIS — F17200 Nicotine dependence, unspecified, uncomplicated: Secondary | ICD-10-CM

## 2019-10-04 DIAGNOSIS — I70209 Unspecified atherosclerosis of native arteries of extremities, unspecified extremity: Secondary | ICD-10-CM | POA: Diagnosis not present

## 2019-10-04 DIAGNOSIS — F4310 Post-traumatic stress disorder, unspecified: Secondary | ICD-10-CM | POA: Diagnosis not present

## 2019-10-04 DIAGNOSIS — F3162 Bipolar disorder, current episode mixed, moderate: Secondary | ICD-10-CM | POA: Diagnosis not present

## 2019-10-04 NOTE — Progress Notes (Signed)
Virtual Visit via Video Note  I connected with Debra [name] on 10/04/19 at 11:30 AM EDT by a video enabled telemedicine application and verified that I am speaking with the correct person using two identifiers.   I discussed the limitations of evaluation and management by telemedicine and the availability of in person appointments. The patient expressed understanding and agreed to proceed.   I discussed the assessment and treatment plan with the patient. The patient was provided an opportunity to ask questions and all were answered. The patient agreed with the plan and demonstrated an understanding of the instructions.   The patient was advised to call back or seek an in-person evaluation if the symptoms worsen or if the condition fails to improve as anticipated.   [facility] MD OP Progress Note  10/04/2019 1:07 PM Debra [name]  MRN:  [id number]  Chief Complaint:  Chief Complaint    Follow-up     HPI: Debra [name] is a 58 year old Caucasian female, married, on disability, lives in [place], has a history of bipolar disorder, PTSD, tobacco use disorder, parkinsonian tremors, chronic back pain, OSA, hypertension, possible REM sleep behavior disorder was evaluated by telemedicine today.  Patient today reports her mood symptoms are improving.  She reports initially when the lamotrigine dosage was increased she struggled with mild tiredness during the day.  She however reports she is currently getting used to the medication and she feels fine.  She reports sleep is good.  She continues to use CPAP.  Patient denies any suicidality, homicidality or perceptual disturbances.  Patient denies any other concerns today.  She reports she continues to work with her therapist and has upcoming appointment with Ms. Alden Hipp tomorrow.   Visit Diagnosis:    ICD-10-CM   1. Bipolar 1 disorder, mixed, moderate (HCC)  F31.62    stable  2. PTSD (post-traumatic stress disorder)  F43.10   3. Tobacco use disorder  F17.200      Past Psychiatric History: I have reviewed past psychiatric history from my progress note on 02/16/2019.  Past trials of Paxil, Depakote, Haldol, trazodone, melatonin  Past Medical History:  Past Medical History:  Diagnosis Date  . Anxiety   . Arthritis   . C. difficile colitis   . C. difficile colitis   . Depression   . Diabetes mellitus without complication ([place])    Type II  . Diabetes mellitus, type II ([place])   . Family history of adverse reaction to anesthesia    Mother - BP drops  . Head injury, closed, with brief LOC ([facility]) 2011ish  . Hyperlipidemia   . Hypertension   . Insomnia   . Myalgia 03/29/2018   Due to statin  . Obesity   . Parkinson's disease ([place])   . Seasonal allergies   . Seizures ([place])   . Tobacco abuse     Past Surgical History:  Procedure Laterality Date  . ABDOMINAL HYSTERECTOMY  2012  . BREAST BIOPSY Right   . CESAREAN SECTION  1992  . CHOLECYSTECTOMY  2013  . LUMBAR LAMINECTOMY/DECOMPRESSION MICRODISCECTOMY Left 09/10/2015   Procedure: Left L4-5 Foraminotomy/Left L5-S1 Diskectomy;  Surgeon: Leeroy Cha, MD;  Location: [facility];  Service: Neurosurgery;  Laterality: Left;  Left L4-5 Foraminotomy/Left L5-S1 Diskectomy    Family Psychiatric History: I have reviewed family psychiatric history from my progress note on 02/16/2019  Family History:  Family History  Problem Relation Age of Onset  . COPD Mother   . Bipolar disorder Father   . Diabetes  Mellitus II Father   . Bipolar disorder Brother   . Diabetes Mellitus II Brother   . Bipolar disorder Brother     Social History: I have reviewed social history from my progress note on 02/16/2019 Social History   Socioeconomic History  . Marital status: Married    Spouse name: [name]  . Number of children: 1  . Years of education: Not on file  . Highest education level: Associate degree: occupational, Hotel manager, or vocational program  Occupational History  . Occupation: Disabled  Social  Needs  . Financial resource strain: Not hard at all  . Food insecurity    Worry: Sometimes true    Inability: Never true  . Transportation needs    Medical: No    Non-medical: No  Tobacco Use  . Smoking status: Current Every Day Smoker    Packs/day: 0.50    Years: 24.00    Pack years: 12.00    Types: Cigarettes    Start date: 03/19/2004  . Smokeless tobacco: Never Used  Substance and Sexual Activity  . Alcohol use: Yes    Alcohol/week: 0.0 standard drinks    Comment: occasional- rare  . Drug use: No  . Sexual activity: Not Currently  Lifestyle  . Physical activity    Days per week: 0 days    Minutes per session: 0 min  . Stress: To some extent  Relationships  . Social connections    Talks on phone: More than three times a week    Gets together: Twice a week    Attends religious service: Never    Active member of club or organization: No    Attends meetings of clubs or organizations: Never    Relationship status: Married  Other Topics Concern  . Not on file  Social History Narrative   Live in private residence w/ husband and their friend who is wheelchair bound, her end of the house is wheelchair accessible.     Allergies:  Allergies  Allergen Reactions  . Clindamycin/Lincomycin     Pt states caused C-Diff  . Metformin And Related Diarrhea  . Benadryl [Diphenhydramine Hcl] Swelling    blistering  . Canagliflozin Other (See Comments)    Other reaction(s): Other (See Comments) Low bp Low bp Low bp  . Depakote Er [Divalproex Sodium Er] Other (See Comments)    seizures  . Dilaudid [Hydromorphone Hcl] Other (See Comments)    seizures  . Diphenhydramine-Zinc Acetate Other (See Comments)  . Erythromycin   . Ethanol   . Haloperidol Other (See Comments)  . Hydromorphone Other (See Comments)  . Neosporin [Neomycin-Bacitracin Zn-Polymyx] Swelling    blistering  . Singulair [Montelukast Sodium] Other (See Comments)    Asthma attacks  . Sitagliptin Other (See  Comments)  . Statins Other (See Comments)    Other reaction(s): Unknown  . Sulfa Antibiotics Hives    Other reaction(s): Unknown  . Victoza [Liraglutide] Other (See Comments) and Nausea And Vomiting    Other reaction(s): Other (See Comments) pancreatitis pancreatitis    Metabolic Disorder Labs: Lab Results  Component Value Date   HGBA1C 8.3 (H) 09/21/2019   MPG 192 09/21/2019   MPG 214 06/16/2019   No results found for: PROLACTIN Lab Results  Component Value Date   CHOL 98 09/21/2019   TRIG 167 (H) 09/21/2019   HDL 49 (L) 09/21/2019   CHOLHDL 2.0 09/21/2019   LDLCALC 24 09/21/2019   LDLCALC 49 06/16/2019   Lab Results  Component Value Date  TSH 1.671 02/16/2019   TSH 2.160 08/20/2015    Therapeutic Level Labs: No results found for: LITHIUM No results found for: VALPROATE No components found for:  CBMZ  Current Medications: Current Outpatient Medications  Medication Sig Dispense Refill  . aspirin EC 81 MG tablet Take 81 mg by mouth daily.    . blood glucose meter kit and supplies Use up to four times daily as directed, Dx E11.65, LON 99 months 1 each 0  . calcium citrate-vitamin D (CITRACAL+D) 315-200 MG-UNIT tablet Take 1 tablet by mouth 2 (two) times daily.    . Cholecalciferol 5000 units capsule Take 5,000 Units by mouth.     . clonazePAM (KLONOPIN) 0.5 MG tablet Take by mouth.    . Evolocumab (REPATHA SURECLICK) 277 MG/ML SOAJ Inject 140 mg into the skin every 14 (fourteen) days. 2 pen 12  . FARXIGA 10 MG TABS tablet TAKE 1 TABLET DAILY (HIGHER DOSE) 90 tablet 3  . fluconazole (DIFLUCAN) 150 MG tablet Take 1 tablet once, may repeat in 48-72 hours if not improving. 2 tablet 0  . folic acid (FOLVITE) 412 MCG tablet Take 800 mcg by mouth daily.     [name] glucose blood (ONE TOUCH ULTRA TEST) test strip USE UP TO FOUR TIMES A DAY AS DIRECTED; Dx 11.51, LON 99 months 300 each 3  . HUMALOG KWIKPEN 100 UNIT/ML KwikPen Give subcutaneously with meals, 20 units in the AM and  35 units with lunch and evening meal 27 pen 5  . hydrochlorothiazide (HYDRODIURIL) 25 MG tablet TAKE 1 TABLET DAILY (DOSE INCREASE, REPLACES ALL OTHER HCTZ PRESCRIPTIONS) 90 tablet 3  . Insulin Pen Needle 29G X 12.7MM MISC 1 each by Does not apply route as needed. 100 each 3  . lamoTRIgine (LAMICTAL) 100 MG tablet Take 1 tablet (100 mg total) by mouth daily. 90 tablet 0  . Lancets (ONETOUCH DELICA PLUS INOMVE72C) MISC CHECK FINGER STICK BLOOD SUGARS FOUR TIMES A DAY 300 each 3  . lisinopril (PRINIVIL,ZESTRIL) 40 MG tablet Take 1 tablet (40 mg total) by mouth daily. 90 tablet 3  . Melatonin 10 MG TABS Take 1 tablet by mouth at bedtime.    . Multiple Vitamins-Minerals (CENTRUM WOMEN) TABS Take 1 tablet by mouth.    [name] PARoxetine (PAXIL) 40 MG tablet TAKE 1 TABLET EVERY MORNING 90 tablet 1  . pioglitazone (ACTOS) 30 MG tablet Take 1 tablet (30 mg total) by mouth daily. 90 tablet 3  . propranolol ER (INDERAL LA) 80 MG 24 hr capsule TAKE 1 CAPSULE (80 MG TOTAL) BY MOUTH ONCE DAILY  3  . TRESIBA FLEXTOUCH 200 UNIT/ML SOPN Inject 120 Units into the skin at bedtime. (Patient taking differently: Inject 120 Units into the skin every morning. ) 36 pen 5   No current facility-administered medications for this visit.      Musculoskeletal: Strength & Muscle Tone: UTA Gait & Station: normal Patient leans: N/A  Psychiatric Specialty Exam: Review of Systems  Psychiatric/Behavioral: Negative for hallucinations and substance abuse. The patient is not nervous/anxious.   All other systems reviewed and are negative.   There were no vitals taken for this visit.There is no height or weight on file to calculate BMI.  General Appearance: Casual  Eye Contact:  Fair  Speech:  Clear and Coherent  Volume:  Normal  Mood:  Euthymic  Affect:  Congruent  Thought Process:  Goal Directed and Descriptions of Associations: Intact  Orientation:  Full (Time, Place, and Person)  Thought Content: Logical  Suicidal  Thoughts:  No  Homicidal Thoughts:  No  Memory:  Immediate;   Fair Recent;   Fair Remote;   Fair  Judgement:  Fair  Insight:  Fair  Psychomotor Activity:  Normal  Concentration:  Concentration: Fair and Attention Span: Fair  Recall:  [employer] of Knowledge: Fair  Language: Fair  Akathisia:  No  Handed:  Right  AIMS (if indicated): Does have tremors of hands - chronic   Assets:  Communication Skills Desire for Improvement Housing Intimacy Social Support  ADL's:  Intact  Cognition: WNL  Sleep:  Fair   Screenings: Mini-Mental     [facility] 09/21/[facility] in [facility]  Total Score (max 30 points )  27    PHQ2-9     [facility] Visit from 09/21/2019 in [facility] Office Visit from 09/07/2019 in [facility] Office Visit from 06/13/2019 in [facility] Office Visit from 05/24/2019 in [facility] Office Visit from 03/20/2019 in [facility]  PHQ-2 Total Score  0  0  0  0  0  PHQ-9 Total Score  0  0  0  0  0       Assessment and Plan: Oveta is a 58 year old Caucasian female, married, on disability, lives in [place], has a history of bipolar disorder, PTSD, hypertension, diabetes melitis, OSA on CPAP, chronic pain was evaluated by telemedicine today.  Patient is biologically predisposed given her history of trauma, family history of mental health problems.  She also has psychosocial stressors of the pandemic as well as financial issues.  She is currently making progress on the current medication regimen.  Plan Bipolar disorder-improving Lamotrigine 100 mg p.o. daily Paxil 40 mg p.o. daily  PTSD-improving Paxil as prescribed Continue psychotherapy sessions.  Insomnia-improving Melatonin as prescribed. Continue CPAP for OSA  Tobacco use disorder-improving Provided smoking cessation counseling. She is trying to cut back.  Follow-up in clinic in 2 months or  sooner if needed.  December 28 at 10 AM  I have spent atleast 15 minutes non face to face with patient today. More than 50 % of the time was spent for psychoeducation and supportive psychotherapy and care coordination. This note was generated in part or whole with voice recognition software. Voice recognition is usually quite accurate but there are transcription errors that can and very often do occur. I apologize for any typographical errors that were not detected and corrected.       Ursula Alert, MD 10/04/2019, 1:07 PM

## 2019-10-05 ENCOUNTER — Ambulatory Visit (INDEPENDENT_AMBULATORY_CARE_PROVIDER_SITE_OTHER): Payer: Managed Care, Other (non HMO) | Admitting: Licensed Clinical Social Worker

## 2019-10-05 ENCOUNTER — Encounter: Payer: Self-pay | Admitting: Licensed Clinical Social Worker

## 2019-10-05 DIAGNOSIS — F3162 Bipolar disorder, current episode mixed, moderate: Secondary | ICD-10-CM

## 2019-10-05 NOTE — Progress Notes (Signed)
Virtual Visit via Video Note  I connected with Debra [name] on 10/05/19 at  8:00 AM EDT by a video enabled telemedicine application and verified that I am speaking with the correct person using two identifiers.   I discussed the limitations of evaluation and management by telemedicine and the availability of in person appointments. The patient expressed understanding and agreed to proceed.  I discussed the assessment and treatment plan with the patient. The patient was provided an opportunity to ask questions and all were answered. The patient agreed with the plan and demonstrated an understanding of the instructions.   The patient was advised to call back or seek an in-person evaluation if the symptoms worsen or if the condition fails to improve as anticipated.  I provided 30 minutes of non-face-to-face time during this encounter.   Alden Hipp, LCSW    THERAPIST PROGRESS NOTE  Session Time: 0800  Participation Level: Active  Behavioral Response: NeatAlertDepressed  Type of Therapy: Individual Therapy  Treatment Goals addressed: Coping  Interventions: Supportive  Summary: Debra [name] is a 58 y.o. female who presents with continued symptoms related to her diagnosis. Debra [name] reports doing "about the same," since our last session. She continues to report problems with the woman with whom she and her husband bought their house with. Debra [name] reports the woman has no money, and "is eating more and more, and invited two people over for Thanksgiving." Debra [name] expressed being frustrated and feeling stuck, "it's like I'm just spinning my wheels." LCSW validated Debra [name]'s feelings and expressed it is difficult to navigate a situation in which you feel stuck. LCSW encouraged Debra [name] to look for the parts of the situation she has control over. Debra [name] reported she and her husband canceled their life insurance which freed up a good amount of money each month. LCSW highlighted that decision as an example of something she  could control about the situation. LCSW also suggested Debra [name] and her husband attempt to plan an in-home date night at least once a week to begin to have something to look forward to, so Debra [name] does not feel like she is "stuck on repeat." Debra [name] expressed understanding and agreement. LCSW also suggested Debra [name] begin setting really firm boundaries with her roommate in order to help facilitate change. We discussed what this would look like moving forward.   Suicidal/Homicidal: No  Therapist Response: Debra [name] continues to work towards her tx goals but has not yet reached them. We will continue to work on emotional regulation skills and improving assertive communication.   Plan: Return again in 4 weeks.  Diagnosis: Axis I: Bipolar 1    Axis II: No diagnosis    Alden Hipp, LCSW 10/05/2019

## 2019-11-06 ENCOUNTER — Ambulatory Visit (INDEPENDENT_AMBULATORY_CARE_PROVIDER_SITE_OTHER): Payer: 59 | Admitting: Licensed Clinical Social Worker

## 2019-11-06 ENCOUNTER — Other Ambulatory Visit: Payer: Self-pay

## 2019-11-06 ENCOUNTER — Encounter: Payer: Self-pay | Admitting: Licensed Clinical Social Worker

## 2019-11-06 DIAGNOSIS — F3162 Bipolar disorder, current episode mixed, moderate: Secondary | ICD-10-CM | POA: Diagnosis not present

## 2019-11-06 NOTE — Progress Notes (Signed)
  Virtual Visit via Telephone Note  I connected with Debra [name] on 11/06/19 at 10:00 AM EST by telephone and verified that I am speaking with the correct person using two identifiers.   I discussed the limitations, risks, security and privacy concerns of performing an evaluation and management service by telephone and the availability of in person appointments. I also discussed with the patient that there may be a patient responsible charge related to this service. The patient expressed understanding and agreed to proceed.  I discussed the assessment and treatment plan with the patient. The patient was provided an opportunity to ask questions and all were answered. The patient agreed with the plan and demonstrated an understanding of the instructions.   The patient was advised to call back or seek an in-person evaluation if the symptoms worsen or if the condition fails to improve as anticipated.  I provided 45 minutes of non-face-to-face time during this encounter.   Alden Hipp, LCSW  THERAPIST PROGRESS NOTE  Session Time: 1000  Participation Level: Active  Behavioral Response: Well GroomedAlertAnxious  Type of Therapy: Individual Therapy  Treatment Goals addressed: Anxiety  Interventions: Supportive  Summary: Debra [name] is a 58 y.o. female who presents with continued symptoms related to her diagnosis. Debra [name] reports doing well since our last session. She reports feeling more settled in their home, and stated they had a nice Thanksgiving. Debra [name] reports her mother may be moving in with her, "we've been getting along a lot better, and she's talking about maybe selling the farm." LCSW asked Debra [name] how she was feeling anticipating that change. She reported the first step was discussing the situation with her husband, as Debra [name]'s mother and husband have had conflict in the past. Debra [name]'s husband was on board with her mother moving in, and expressed, "that's all in the past." LCSW validated Debra [name]'s feelings  and discussed how comforting it must have been in that moment. Debra [name] expressed understanding and agreement. LCSW held space for Debra [name] to discuss her feelings around the situation, and discuss any fears she has. Debra [name] reported she feared her other family members may feel she is trying to take more than her share of the inheritance, so we discussed ways to set clear boundaries and expectations where money contributions are concerned. We also discussed how Debra [name] can continue to challenge negative thoughts associated with her anxiety.  Suicidal/Homicidal: No  Therapist Response: Debra [name] continues to work towards her tx goals but has not yet reached them. We will continue to work on emotional regulation skills and distress tolerance skills moving forward.   Plan: Return again in 4 weeks.  Diagnosis: Axis I: Bipolar 1 Disorder    Axis II: No diagnosis    Alden Hipp, LCSW 11/06/2019

## 2019-11-07 MED ORDER — REPATHA SURECLICK 140 MG/ML ~~LOC~~ SOAJ: 140.0000 mg | SUBCUTANEOUS | 2 refills | Status: DC

## 2019-11-10 ENCOUNTER — Telehealth: Payer: Self-pay

## 2019-11-10 MED ORDER — REPATHA SURECLICK 140 MG/ML ~~LOC~~ SOAJ: 140.0000 mg | SUBCUTANEOUS | 2 refills | Status: DC

## 2019-11-10 NOTE — Telephone Encounter (Signed)
Sent to local pharmacy per pt request

## 2019-11-20 ENCOUNTER — Ambulatory Visit: Payer: Medicare Other | Admitting: Podiatry

## 2019-12-04 ENCOUNTER — Other Ambulatory Visit: Payer: Self-pay

## 2019-12-04 ENCOUNTER — Ambulatory Visit (INDEPENDENT_AMBULATORY_CARE_PROVIDER_SITE_OTHER): Payer: 59 | Admitting: Psychiatry

## 2019-12-04 ENCOUNTER — Encounter: Payer: Self-pay | Admitting: Psychiatry

## 2019-12-04 DIAGNOSIS — E1165 Type 2 diabetes mellitus with hyperglycemia: Secondary | ICD-10-CM

## 2019-12-04 DIAGNOSIS — I70209 Unspecified atherosclerosis of native arteries of extremities, unspecified extremity: Secondary | ICD-10-CM

## 2019-12-04 DIAGNOSIS — F3178 Bipolar disorder, in full remission, most recent episode mixed: Secondary | ICD-10-CM

## 2019-12-04 DIAGNOSIS — F17200 Nicotine dependence, unspecified, uncomplicated: Secondary | ICD-10-CM | POA: Diagnosis not present

## 2019-12-04 DIAGNOSIS — F4310 Post-traumatic stress disorder, unspecified: Secondary | ICD-10-CM

## 2019-12-04 DIAGNOSIS — E1151 Type 2 diabetes mellitus with diabetic peripheral angiopathy without gangrene: Secondary | ICD-10-CM

## 2019-12-04 MED ORDER — LAMOTRIGINE 100 MG PO TABS: 100.0000 mg | ORAL_TABLET | Freq: Every day | ORAL | 1 refills | Status: DC

## 2019-12-04 MED ORDER — PAROXETINE HCL 40 MG PO TABS: ORAL_TABLET | ORAL | 1 refills | Status: DC

## 2019-12-04 NOTE — Progress Notes (Signed)
Virtual Visit via Video Note  I connected with Debra [name] on 12/04/19 at 10:00 AM EST by a video enabled telemedicine application and verified that I am speaking with the correct person using two identifiers.   I discussed the limitations of evaluation and management by telemedicine and the availability of in person appointments. The patient expressed understanding and agreed to proceed.     I discussed the assessment and treatment plan with the patient. The patient was provided an opportunity to ask questions and all were answered. The patient agreed with the plan and demonstrated an understanding of the instructions.   The patient was advised to call back or seek an in-person evaluation if the symptoms worsen or if the condition fails to improve as anticipated.   [place] MD OP Progress Note  12/04/2019 9:57 AM Debra [name]  MRN:  [id number]  Chief Complaint:  Chief Complaint    Follow-up     HPI: Debra [name] is a 58 year old Caucasian female, married, on disability, lives in [place], has a history of bipolar disorder, PTSD, tobacco use disorder, parkinsonian tremors, chronic back pain, OSA, hypertension, possible REM sleep behavior disorder was evaluated by telemedicine today.  Patient today reports she is currently doing well on the current medication regimen.  She denies any significant depression or mood lability.  She however reports she is anxious due to her current situation.  She was exposed to COVID-19 and is awaiting her test results.  Patient reports sleep is good.  She continues to use CPAP.  Patient continues to cut back on smoking cigarettes.  Patient denies any suicidality, homicidality or perceptual disturbances.  Patient denies any other concerns today.   Visit Diagnosis:    ICD-10-CM   1. Bipolar disorder, in full remission, most recent episode mixed (HCC)  F31.78 lamoTRIgine (LAMICTAL) 100 MG tablet    PARoxetine (PAXIL) 40 MG tablet  2. PTSD (post-traumatic stress  disorder)  F43.10   3. Tobacco use disorder  F17.200     Past Psychiatric History: I have reviewed past psychiatric history from my progress note on 02/16/2019.  Past trials of Paxil, Depakote, Haldol, trazodone, melatonin.  Past Medical History:  Past Medical History:  Diagnosis Date  . Anxiety   . Arthritis   . C. difficile colitis   . C. difficile colitis   . Depression   . Diabetes mellitus without complication ([place])    Type II  . Diabetes mellitus, type II ([place])   . Family history of adverse reaction to anesthesia    Mother - BP drops  . Head injury, closed, with brief LOC ([place]) 2011ish  . Hyperlipidemia   . Hypertension   . Insomnia   . Myalgia 03/29/2018   Due to statin  . Obesity   . Parkinson's disease ([place])   . Seasonal allergies   . Seizures ([place])   . Tobacco abuse     Past Surgical History:  Procedure Laterality Date  . ABDOMINAL HYSTERECTOMY  2012  . BREAST BIOPSY Right   . CESAREAN SECTION  1992  . CHOLECYSTECTOMY  2013  . LUMBAR LAMINECTOMY/DECOMPRESSION MICRODISCECTOMY Left 09/10/2015   Procedure: Left L4-5 Foraminotomy/Left L5-S1 Diskectomy;  Surgeon: Leeroy Cha, MD;  Location: [facility];  Service: Neurosurgery;  Laterality: Left;  Left L4-5 Foraminotomy/Left L5-S1 Diskectomy    Family Psychiatric History: I have reviewed family psychiatric history from my progress note on 02/16/2019.  Family History:  Family History  Problem Relation Age of Onset  . COPD Mother   .  Bipolar disorder Father   . Diabetes Mellitus II Father   . Bipolar disorder Brother   . Diabetes Mellitus II Brother   . Bipolar disorder Brother     Social History: Reviewed social history from my progress note on 02/16/2019. Social History   Socioeconomic History  . Marital status: Married    Spouse name: Debra [name]  . Number of children: 1  . Years of education: Not on file  . Highest education level: Associate degree: occupational, Hotel manager, or vocational program   Occupational History  . Occupation: Disabled  Tobacco Use  . Smoking status: Current Every Day Smoker    Packs/day: 0.50    Years: 24.00    Pack years: 12.00    Types: Cigarettes    Start date: 03/19/2004  . Smokeless tobacco: Never Used  Substance and Sexual Activity  . Alcohol use: Yes    Alcohol/week: 0.0 standard drinks    Comment: occasional- rare  . Drug use: No  . Sexual activity: Not Currently  Other Topics Concern  . Not on file  Social History Narrative   Live in private residence w/ husband and their friend who is wheelchair bound, her end of the house is wheelchair accessible.    Social Determinants of Health   Financial Resource Strain: Low Risk   . Difficulty of Paying Living Expenses: Not hard at all  Food Insecurity: Food Insecurity Present  . Worried About Charity fundraiser in the Last Year: Sometimes true  . Ran Out of Food in the Last Year: Never true  Transportation Needs:   . Lack of Transportation (Medical): Not on file  . Lack of Transportation (Non-Medical): Not on file  Physical Activity:   . Days of Exercise per Week: Not on file  . Minutes of Exercise per Session: Not on file  Stress: Stress Concern Present  . Feeling of Stress : To some extent  Social Connections: Unknown  . Frequency of Communication with Friends and Family: More than three times a week  . Frequency of Social Gatherings with Friends and Family: Not on file  . Attends Religious Services: Never  . Active Member of Clubs or Organizations: Not on file  . Attends Archivist Meetings: Not on file  . Marital Status: Not on file    Allergies:  Allergies  Allergen Reactions  . Clindamycin/Lincomycin     Pt states caused C-Diff  . Metformin And Related Diarrhea  . Benadryl [Diphenhydramine Hcl] Swelling    blistering  . Canagliflozin Other (See Comments)    Other reaction(s): Other (See Comments) Low bp Low bp Low bp  . Depakote Er [Divalproex Sodium Er] Other  (See Comments)    seizures  . Dilaudid [Hydromorphone Hcl] Other (See Comments)    seizures  . Diphenhydramine-Zinc Acetate Other (See Comments)  . Erythromycin   . Ethanol   . Haloperidol Other (See Comments)  . Hydromorphone Other (See Comments)  . Neosporin [Neomycin-Bacitracin Zn-Polymyx] Swelling    blistering  . Singulair [Montelukast Sodium] Other (See Comments)    Asthma attacks  . Sitagliptin Other (See Comments)  . Statins Other (See Comments)    Other reaction(s): Unknown  . Sulfa Antibiotics Hives    Other reaction(s): Unknown  . Victoza [Liraglutide] Other (See Comments) and Nausea And Vomiting    Other reaction(s): Other (See Comments) pancreatitis pancreatitis    Metabolic Disorder Labs: Lab Results  Component Value Date   HGBA1C 8.3 (H) 09/21/2019   MPG 192 09/21/2019  MPG 214 06/16/2019   No results found for: PROLACTIN Lab Results  Component Value Date   CHOL 98 09/21/2019   TRIG 167 (H) 09/21/2019   HDL 49 (L) 09/21/2019   CHOLHDL 2.0 09/21/2019   LDLCALC 24 09/21/2019   LDLCALC 49 06/16/2019   Lab Results  Component Value Date   TSH 1.671 02/16/2019   TSH 2.160 08/20/2015    Therapeutic Level Labs: No results found for: LITHIUM No results found for: VALPROATE No components found for:  CBMZ  Current Medications: Current Outpatient Medications  Medication Sig Dispense Refill  . propranolol (INNOPRAN XL) 80 MG 24 hr capsule TAKE 1 CAPSULE DAILY    . aspirin EC 81 MG tablet Take 81 mg by mouth daily.    . blood glucose meter kit and supplies Use up to four times daily as directed, Dx E11.65, LON 99 months 1 each 0  . Calcium Carbonate-Vitamin D 600-200 MG-UNIT TABS Take by mouth.    . calcium citrate-vitamin D (CITRACAL+D) 315-200 MG-UNIT tablet Take 1 tablet by mouth 2 (two) times daily.    . Cholecalciferol 5000 units capsule Take 5,000 Units by mouth.     . clonazePAM (KLONOPIN) 0.5 MG tablet Take by mouth.    . Evolocumab (REPATHA  SURECLICK) 259 MG/ML SOAJ Inject 140 mg into the skin every 14 (fourteen) days. 6 pen 2  . FARXIGA 10 MG TABS tablet TAKE 1 TABLET DAILY (HIGHER DOSE) 90 tablet 3  . fluconazole (DIFLUCAN) 150 MG tablet Take 1 tablet once, may repeat in 48-72 hours if not improving. 2 tablet 0  . folic acid (FOLVITE) 563 MCG tablet Take 800 mcg by mouth daily.     [name] glucose blood (ONE TOUCH ULTRA TEST) test strip USE UP TO FOUR TIMES A DAY AS DIRECTED; Dx 11.51, LON 99 months 300 each 3  . HUMALOG KWIKPEN 100 UNIT/ML KwikPen Give subcutaneously with meals, 20 units in the AM and 35 units with lunch and evening meal 27 pen 5  . hydrochlorothiazide (HYDRODIURIL) 25 MG tablet TAKE 1 TABLET DAILY (DOSE INCREASE, REPLACES ALL OTHER HCTZ PRESCRIPTIONS) 90 tablet 3  . Insulin Pen Needle 29G X 12.7MM MISC 1 each by Does not apply route as needed. 100 each 3  . lamoTRIgine (LAMICTAL) 100 MG tablet Take 1 tablet (100 mg total) by mouth daily. 90 tablet 1  . Lancets (ONETOUCH DELICA PLUS OVFIEP32R) MISC CHECK FINGER STICK BLOOD SUGARS FOUR TIMES A DAY 300 each 3  . lisinopril (PRINIVIL,ZESTRIL) 40 MG tablet Take 1 tablet (40 mg total) by mouth daily. 90 tablet 3  . Melatonin 10 MG TABS Take 1 tablet by mouth at bedtime.    . Multiple Vitamins-Minerals (CENTRUM WOMEN) TABS Take 1 tablet by mouth.    [name] PARoxetine (PAXIL) 40 MG tablet TAKE 1 TABLET EVERY MORNING 90 tablet 1  . pioglitazone (ACTOS) 30 MG tablet Take 1 tablet (30 mg total) by mouth daily. 90 tablet 3  . propranolol ER (INDERAL LA) 80 MG 24 hr capsule TAKE 1 CAPSULE (80 MG TOTAL) BY MOUTH ONCE DAILY  3  . TRESIBA FLEXTOUCH 200 UNIT/ML SOPN Inject 120 Units into the skin at bedtime. (Patient taking differently: Inject 120 Units into the skin every morning. ) 36 pen 5   No current facility-administered medications for this visit.     Musculoskeletal: Strength & Muscle Tone: UTA Gait & Station: normal Patient leans: N/A  Psychiatric Specialty Exam: Review of  Systems  Psychiatric/Behavioral: Negative for agitation, behavioral  problems, confusion, decreased concentration, dysphoric mood, hallucinations, self-injury, sleep disturbance and suicidal ideas. The patient is not nervous/anxious and is not hyperactive.   All other systems reviewed and are negative.   There were no vitals taken for this visit.There is no height or weight on file to calculate BMI.  General Appearance: Casual  Eye Contact:  Fair  Speech:  Clear and Coherent  Volume:  Normal  Mood:  Euthymic  Affect:  Congruent  Thought Process:  Goal Directed and Descriptions of Associations: Intact  Orientation:  Full (Time, Place, and Person)  Thought Content: Logical   Suicidal Thoughts:  No  Homicidal Thoughts:  No  Memory:  Immediate;   Fair Recent;   Fair Remote;   Fair  Judgement:  Fair  Insight:  Fair  Psychomotor Activity:  Normal  Concentration:  Concentration: Fair and Attention Span: Fair  Recall:  [employer] of Knowledge: Fair  Language: Fair  Akathisia:  No  Handed:  Right  AIMS (if indicated): Denies any tremors, rigidity  Assets:  Communication Skills Desire for Improvement Housing Intimacy Social Support  ADL's:  Intact  Cognition: WNL  Sleep:  Fair   Screenings: Mini-Mental     Office Visit from 09/21/2016 in [facility]  Total Score (max 30 points )  27    PHQ2-9     Office Visit from 09/21/2019 in [facility] Office Visit from 09/07/2019 in [facility] Office Visit from 06/13/2019 in [facility] Office Visit from 05/24/2019 in [facility] Office Visit from 03/20/2019 in [facility]  PHQ-2 Total Score  0  0  0  0  0  PHQ-9 Total Score  0  0  0  0  0       Assessment and Plan: Moriyah is a 58 year old Caucasian female, married, disabled, lives in [place], has a history of bipolar disorder, PTSD, hypertension, diabetes melitis, OSA on  CPAP, chronic pain was evaluated by telemedicine today.  Patient is biologically predisposed given her history of trauma, family history of mental health problems.  She also has psychosocial stressors of the pandemic as well as financial issues.  Patient is currently making progress on the current medication regimen.  Plan Bipolar disorder-in remission Lamotrigine 100 mg p.o. daily Paxil 40 mg p.o. daily  PTSD-stable Paxil as prescribed Continue CBT with Ms. Alden Hipp.  Insomnia-improving Melatonin as prescribed Continue CPAP for OSA  Tobacco use disorder-improving She is currently cutting back.  Provided smoking cessation counseling.   Follow-up in clinic in 2 months or sooner if needed.  February 25 at 2;20 pm  I have spent atleast 15 minutes non face to face with patient today. More than 50 % of the time was spent for psychoeducation and supportive psychotherapy and care coordination. This note was generated in part or whole with voice recognition software. Voice recognition is usually quite accurate but there are transcription errors that can and very often do occur. I apologize for any typographical errors that were not detected and corrected.      Ursula Alert, MD 12/04/2019, 9:57 AM

## 2019-12-05 ENCOUNTER — Encounter: Payer: Self-pay | Admitting: Licensed Clinical Social Worker

## 2019-12-05 ENCOUNTER — Ambulatory Visit: Payer: Self-pay | Admitting: *Deleted

## 2019-12-05 ENCOUNTER — Ambulatory Visit (INDEPENDENT_AMBULATORY_CARE_PROVIDER_SITE_OTHER): Payer: 59 | Admitting: Licensed Clinical Social Worker

## 2019-12-05 ENCOUNTER — Other Ambulatory Visit: Payer: Self-pay

## 2019-12-05 DIAGNOSIS — F4310 Post-traumatic stress disorder, unspecified: Secondary | ICD-10-CM

## 2019-12-05 DIAGNOSIS — F3178 Bipolar disorder, in full remission, most recent episode mixed: Secondary | ICD-10-CM

## 2019-12-05 NOTE — Progress Notes (Signed)
  Virtual Visit via Video Note  I connected with Debra [name] on 12/05/19 at 10:00 AM EST by a video enabled telemedicine application and verified that I am speaking with the correct person using two identifiers.   I discussed the limitations of evaluation and management by telemedicine and the availability of in person appointments. The patient expressed understanding and agreed to proceed.  I discussed the assessment and treatment plan with the patient. The patient was provided an opportunity to ask questions and all were answered. The patient agreed with the plan and demonstrated an understanding of the instructions.   The patient was advised to call back or seek an in-person evaluation if the symptoms worsen or if the condition fails to improve as anticipated.  I provided 45 minutes of non-face-to-face time during this encounter.   Alden Hipp, LCSW   THERAPIST PROGRESS NOTE  Session Time: 1000  Participation Level: Active  Behavioral Response: CasualAlertAnxious  Type of Therapy: Individual Therapy  Treatment Goals addressed: Coping  Interventions: Supportive  Summary: Debra [name] is a 58 y.o. female who presents with continued symptoms related to her diagnosis. Debra [name] reports doing well since our last session, but noted ongoing depression related to her living situation. She reports she continues to feel resentful that she is expected to do everything in the home. She reported on Christmas she "finally had enough," and ended up not cooking dinner. She reported her husband and roommate were very shocked by this but did not change their behaviors at all. LCSW held space for Debra [name] to discuss this situation and her feelings around it. LCSW validated Debra [name] feelings and encouraged her to communicate her frustrations to those she lives with. Debra [name] expressed feeling that would not make a difference with her roommate. LCSW encouraged Debra [name] to come up with a list of things she's used to doing herself,  and bring it to the roommate and her hus and. Then, ask them what they planned on doing on the list. LCSW encouraged to make this a collaborative conversation so everyone feels heard and are more willing to be open to Debra [name]'s request. Debra [name] expressed understanding and agreement with this information.   Suicidal/Homicidal: No  Therapist Response: Debra [name] continues to work towards her tx goals but has not yet reached them. We will continue to work on improving emotional regulation skills moving forward and improving communication as well.   Plan: Return again in 4 weeks.  Diagnosis: Axis I: Post Traumatic Stress Disorder    Axis II: No diagnosis    Alden Hipp, LCSW 12/05/2019

## 2019-12-05 NOTE — Telephone Encounter (Signed)
Looks like no symptoms and has a Materials engineer tomorrow am

## 2019-12-05 NOTE — Telephone Encounter (Signed)
  Reason for Disposition . [1] CLOSE CONTACT COVID-19 EXPOSURE within last 14 days AND [2] NO symptoms  Answer Assessment - Initial Assessment Questions 1. COVID-19 CLOSE CONTACT: "Who is the person with the confirmed or suspected COVID-19 infection that you were exposed to?"     Called ambulance for pt 2. PLACE of CONTACT: "Where were you when you were exposed to COVID-19?" (e.g., home, school, medical waiting room; which city?)     home 3. TYPE of CONTACT: "How much contact was there?" (e.g., sitting next to, live in same house, work in same office, same building)     hours 4. DURATION of CONTACT: "How long were you in contact with the COVID-19 patient?" (e.g., a few seconds, passed by person, a few minutes, 15 minutes or longer, live with the patient)     > 15 min 5. MASK: "Were you wearing a mask?" "Was the other person wearing a mask?" Note: wearing a mask reduces the risk of an  otherwise close contact.      6. DATE of CONTACT: "When did you have contact with a COVID-19 patient?" (e.g., how many days ago) 12/02/2019 and 12/03/2019 7. COMMUNITY SPREAD: "Are there lots of cases of COVID-19 (community spread) where you live?" (See public health department website, if unsure)       Major community spread 8. SYMPTOMS: "Do you have any symptoms?" (e.g., fever, cough, breathing difficulty, loss of taste or smell)    no 9. PREGNANCY OR POSTPARTUM: "Is there any chance you are pregnant?" "When was your last menstrual period?" "Did you deliver in the last 2 weeks?"      10. HIGH RISK: "Do you have any heart or lung problems? Do you have a weak immune system?" (e.g., heart failure, COPD, asthma, HIV positive, chemotherapy, renal failure, diabetes mellitus, sickle cell anemia, obesity)      diabetic 11.  TRAVEL: "Have you traveled out of the country recently?" If so, "When and where?"  Also ask about out-of-state travel, since the CDC has identified some high-risk cities for community spread in  the [place].  Note: Travel becomes less relevant if there is widespread community transmission where the patient lives.  Protocols used: CORONAVIRUS (COVID-19) EXPOSURE-A-AH

## 2019-12-05 NOTE — Telephone Encounter (Signed)
Per initial encounter, "Pt called and stated that she was exposed to someone with covid and would like a call back from the office if she needs to be tested"; she is was exposed on 12/26 and 12/27//2020; the pt says she is not having symptoms; explained it can take up to 14 days to show symptoms; reviewed symptoms and quarantine criteria; explained there are community sites that do not require MD's order, but appt is needed; since she is a high risk; pt a advised to talk with her PCP for additional recommendations; the pt requested a virtual appt; she was offered and accepted with Debra [name], [place], 11/26/2019 at 0920; she verbalized understanding; will route to office for notification of upcoming appt

## 2019-12-06 ENCOUNTER — Telehealth (INDEPENDENT_AMBULATORY_CARE_PROVIDER_SITE_OTHER): Payer: Managed Care, Other (non HMO) | Admitting: Family Medicine

## 2019-12-06 ENCOUNTER — Encounter: Payer: Self-pay | Admitting: Family Medicine

## 2019-12-06 VITALS — BP 142/95 | HR 77 | Temp 97.3°F | Ht 65.0 in | Wt 272.0 lb

## 2019-12-06 DIAGNOSIS — Z20822 Contact with and (suspected) exposure to covid-19: Secondary | ICD-10-CM

## 2019-12-06 DIAGNOSIS — Z20828 Contact with and (suspected) exposure to other viral communicable diseases: Secondary | ICD-10-CM | POA: Diagnosis not present

## 2019-12-06 NOTE — Progress Notes (Signed)
Name: Debra [name]   MRN: [id number]    DOB: [birth year]   Date:12/06/2019       Progress Note  Subjective:    Chief Complaint  Chief Complaint  Patient presents with   covid exposure    day after christmas, pt is high risk no symptoms    I connected with  [name]  on 12/06/19 at  9:20 AM EST by a video enabled telemedicine application and verified that I am speaking with the correct person using two identifiers.  I discussed the limitations of evaluation and management by telemedicine and the availability of in person appointments. The patient expressed understanding and agreed to proceed. Staff also discussed with the patient that there may be a patient responsible charge related to this service. Patient Location: home Provider Location: [facility] Additional Individuals present   none  HPI  Patient is a 58 year old female with history of atherosclerosis, uncontrolled type 2 diabetes, she had a recent Covid exposure. 4 days ago she went to care for her friend who had Covid but was on the tail end of her isolation.  Roughly around day 10.  Patient went into her home change bandages and dressings on her she suffers from lupus and lymphedema.  Patient did wear a mask and took it off inside the home but only when she was out of the room where the patient was staying she was in the home for an extended period of time on Saturday and Sunday, 3 and 4 days ago.  On Sunday when she cared for her friend, the friend was extremely ill-appearing and she called the ambulance to have patient taken to the hospital where she was subsequently admitted with Covid test in the hospital positive.  Patient is concerned about the exposure and is asking if she needs any testing she has not developed any symptoms. She has been quarantining at home, she does not go anywhere.  Comorbidities include HTN, morbid obesity, likely lung disease, uncontrolled DM, HLD   Patient Active Problem List   Diagnosis Date  Noted   Pain due to onychomycosis of toenails of both feet 09/11/2019   Pincer nail deformity 09/11/2019   Bipolar 1 disorder, mixed, moderate (HCC) 08/30/2019   PTSD (post-traumatic stress disorder) 08/30/2019   Tobacco use disorder 08/30/2019   Obstructive sleep apnea 11/18/2018   Parkinsonian tremor ([place]) 11/18/2018   RBD (REM behavioral disorder) 05/19/2018   Morbid obesity ([place]) 05/03/2018   Osteopenia 03/16/2018   Abdominal aortic atherosclerosis ([place]) 03/04/2018   Adverse reaction to statin medication 03/04/2018   Uncontrolled diabetes mellitus type 2 with atherosclerosis of arteries of extremities ([place]) 03/04/2018   Hyperlipidemia 04/20/2017   Lumbar disc disease with radiculopathy 08/20/2015   Lumbar herniated disc 08/20/2015   Left-sided low back pain with sciatica 06/03/2015   Bipolar disorder ([place]) 06/03/2015   Insomnia, persistent 06/03/2015   BP (high blood pressure) 06/03/2015   Compulsive tobacco user syndrome 06/03/2015   Phlebectasia 06/03/2015    Social History   Tobacco Use   Smoking status: Current Every Day Smoker    Packs/day: 0.50    Years: 24.00    Pack years: 12.00    Types: Cigarettes    Start date: 03/19/2004   Smokeless tobacco: Never Used  Substance Use Topics   Alcohol use: Yes    Alcohol/week: 0.0 standard drinks    Comment: occasional- rare     Current Outpatient Medications:    aspirin EC 81 MG tablet, Take  81 mg by mouth daily., Disp: , Rfl:    Calcium Carbonate-Vitamin D 600-200 MG-UNIT TABS, Take by mouth., Disp: , Rfl:    Cholecalciferol 5000 units capsule, Take 5,000 Units by mouth. , Disp: , Rfl:    Evolocumab (REPATHA SURECLICK) 537 MG/ML SOAJ, Inject 140 mg into the skin every 14 (fourteen) days., Disp: 6 pen, Rfl: 2   FARXIGA 10 MG TABS tablet, TAKE 1 TABLET DAILY (HIGHER DOSE), Disp: 90 tablet, Rfl: 3   folic acid (FOLVITE) 943 MCG tablet, Take 800 mcg by mouth daily. , Disp: , Rfl:    HUMALOG  KWIKPEN 100 UNIT/ML KwikPen, Give subcutaneously with meals, 20 units in the AM and 35 units with lunch and evening meal, Disp: 27 pen, Rfl: 5   lamoTRIgine (LAMICTAL) 100 MG tablet, Take 1 tablet (100 mg total) by mouth daily., Disp: 90 tablet, Rfl: 1   lisinopril (PRINIVIL,ZESTRIL) 40 MG tablet, Take 1 tablet (40 mg total) by mouth daily., Disp: 90 tablet, Rfl: 3   Melatonin 10 MG TABS, Take 1 tablet by mouth at bedtime., Disp: , Rfl:    Multiple Vitamins-Minerals (CENTRUM WOMEN) TABS, Take 1 tablet by mouth., Disp: , Rfl:    PARoxetine (PAXIL) 40 MG tablet, TAKE 1 TABLET EVERY MORNING, Disp: 90 tablet, Rfl: 1   pioglitazone (ACTOS) 30 MG tablet, Take 1 tablet (30 mg total) by mouth daily., Disp: 90 tablet, Rfl: 3   propranolol (INNOPRAN XL) 80 MG 24 hr capsule, TAKE 1 CAPSULE DAILY, Disp: , Rfl:    TRESIBA FLEXTOUCH 200 UNIT/ML SOPN, Inject 120 Units into the skin at bedtime. (Patient taking differently: Inject 120 Units into the skin every morning. ), Disp: 36 pen, Rfl: 5   blood glucose meter kit and supplies, Use up to four times daily as directed, Dx E11.65, LON 99 months, Disp: 1 each, Rfl: 0   clonazePAM (KLONOPIN) 0.5 MG tablet, Take by mouth., Disp: , Rfl:    glucose blood (ONE TOUCH ULTRA TEST) test strip, USE UP TO FOUR TIMES A DAY AS DIRECTED; Dx 11.51, LON 99 months, Disp: 300 each, Rfl: 3   hydrochlorothiazide (HYDRODIURIL) 25 MG tablet, TAKE 1 TABLET DAILY (DOSE INCREASE, REPLACES ALL OTHER HCTZ PRESCRIPTIONS), Disp: 90 tablet, Rfl: 3   Insulin Pen Needle 29G X 12.7MM MISC, 1 each by Does not apply route as needed., Disp: 100 each, Rfl: 3   Lancets (ONETOUCH DELICA PLUS EXMDYJ09K) MISC, CHECK FINGER STICK BLOOD SUGARS FOUR TIMES A DAY, Disp: 300 each, Rfl: 3  Allergies  Allergen Reactions   Clindamycin/Lincomycin     Pt states caused C-Diff   Metformin And Related Diarrhea   Benadryl [Diphenhydramine Hcl] Swelling    blistering   Canagliflozin Other (See  Comments)    Other reaction(s): Other (See Comments) Low bp Low bp Low bp   Depakote Er [Divalproex Sodium Er] Other (See Comments)    seizures   Dilaudid [Hydromorphone Hcl] Other (See Comments)    seizures   Diphenhydramine-Zinc Acetate Other (See Comments)   Erythromycin    Ethanol    Haloperidol Other (See Comments)   Hydromorphone Other (See Comments)   Neosporin [Neomycin-Bacitracin Zn-Polymyx] Swelling    blistering   Singulair [Montelukast Sodium] Other (See Comments)    Asthma attacks   Sitagliptin Other (See Comments)   Statins Other (See Comments)    Other reaction(s): Unknown   Sulfa Antibiotics Hives    Other reaction(s): Unknown   Victoza [Liraglutide] Other (See Comments) and Nausea And Vomiting  Other reaction(s): Other (See Comments) pancreatitis pancreatitis    I personally reviewed active problem list, medication list, allergies, family history, social history, health maintenance, notes from last encounter, lab results, imaging with the patient/caregiver today.  Review of Systems  Constitutional: Negative.   HENT: Negative.   Eyes: Negative.   Respiratory: Negative.   Cardiovascular: Negative.   Gastrointestinal: Negative.   Endocrine: Negative.   Genitourinary: Negative.   Musculoskeletal: Negative.   Skin: Negative.   Allergic/Immunologic: Negative.   Neurological: Negative.   Hematological: Negative.   Psychiatric/Behavioral: Negative.   All other systems reviewed and are negative.    Objective:   Virtual encounter, vitals limited, only able to obtain the following Today's Vitals   12/06/19 0928  BP: (!) 142/95  Pulse: 77  Temp: (!) 97.3 F (36.3 C)  Weight: 272 lb (123.4 kg)  Height: '5\' 5"'  (1.651 m)   Body mass index is 45.26 kg/m. Nursing Note and Vital Signs reviewed.  Physical Exam Vitals and nursing note reviewed.  Constitutional:      General: She is not in acute distress.    Appearance: Normal  appearance. She is well-developed. She is obese. She is not ill-appearing, toxic-appearing or diaphoretic.     Comments: Well appearing, pleasant, NAD  HENT:     Head: Normocephalic and atraumatic.  Eyes:     General:        Right eye: No discharge.        Left eye: No discharge.     Conjunctiva/sclera: Conjunctivae normal.  Neck:     Trachea: No tracheal deviation.  Cardiovascular:     Rate and Rhythm: Normal rate.  Pulmonary:     Effort: Pulmonary effort is normal. No respiratory distress.     Breath sounds: No stridor.  Skin:    Coloration: Skin is not jaundiced or pale.     Findings: No rash.  Neurological:     Mental Status: She is alert.  Psychiatric:        Mood and Affect: Mood normal.        Behavior: Behavior normal.     PE limited by telephone encounter  No results found for this or any previous visit (from the past 72 hour(s)).  Assessment and Plan:     ICD-10-CM   1. Close exposure to COVID-19 virus  Z20.828 COVID   asx, high risk exposure, pt has some comorbidities, pt instructed to quarantine and get tested once in the next 7 days, and to notify me if positive or sx    Pt educated on COVID transmission, incubation period, signs and sx of COVID, explained quarantine guidelines per CDC 7-14 d and discussed options of testing.   -Red flags and when to present for emergency care or RTC including fever >101.70F, chest pain, shortness of breath, new/worsening/un-resolving symptoms,  reviewed with patient at time of visit. Follow up and care instructions discussed and provided in AVS. - I discussed the assessment and treatment plan with the patient. The patient was provided an opportunity to ask questions and all were answered. The patient agreed with the plan and demonstrated an understanding of the instructions.  I provided 15 minutes of non-face-to-face time during this encounter.  Delsa Grana, PA-C 12/06/19 10:33 AM

## 2019-12-07 ENCOUNTER — Ambulatory Visit: Payer: Medicare Other | Attending: Internal Medicine

## 2019-12-07 ENCOUNTER — Telehealth: Payer: Self-pay

## 2019-12-07 DIAGNOSIS — Z20822 Contact with and (suspected) exposure to covid-19: Secondary | ICD-10-CM

## 2019-12-07 NOTE — Telephone Encounter (Signed)
Hello can we see about getting this patient in please, she is high risk

## 2019-12-13 LAB — NOVEL CORONAVIRUS, NAA

## 2020-01-04 ENCOUNTER — Ambulatory Visit (INDEPENDENT_AMBULATORY_CARE_PROVIDER_SITE_OTHER): Payer: Managed Care, Other (non HMO) | Admitting: Licensed Clinical Social Worker

## 2020-01-04 ENCOUNTER — Encounter: Payer: Self-pay | Admitting: Licensed Clinical Social Worker

## 2020-01-04 ENCOUNTER — Other Ambulatory Visit: Payer: Self-pay

## 2020-01-04 DIAGNOSIS — F3178 Bipolar disorder, in full remission, most recent episode mixed: Secondary | ICD-10-CM | POA: Diagnosis not present

## 2020-01-04 NOTE — Progress Notes (Signed)
Virtual Visit via Video Note  I connected with Debra [name] on 01/04/20 at 10:00 AM EST by a video enabled telemedicine application and verified that I am speaking with the correct person using two identifiers.   I discussed the limitations of evaluation and management by telemedicine and the availability of in person appointments. The patient expressed understanding and agreed to proceed.   I discussed the assessment and treatment plan with the patient. The patient was provided an opportunity to ask questions and all were answered. The patient agreed with the plan and demonstrated an understanding of the instructions.   The patient was advised to call back or seek an in-person evaluation if the symptoms worsen or if the condition fails to improve as anticipated.  I provided 45 minutes of non-face-to-face time during this encounter.   Heidi Dach, LCSW    THERAPIST PROGRESS NOTE  Session Time: 1000  Participation Level: Active  Behavioral Response: NeatAlertAnxious  Type of Therapy: Individual Therapy  Treatment Goals addressed: Coping  Interventions: Solution Focused and Strength-based  Summary: TAHEERA THOMANN is a 59 y.o. female who presents with continued symptoms related to her diagnosis. Ota reports doing well since our last session. She reports a friend of hers got COVID, and she had to call the ambulance for her as she was not in good condition. Azayla reported when she arrived on the scene, her friend was bleeding a lot from sores from not moving, and was in very bad condition. Prim reports feeling anxiety around this situation, and reports picturing the images in her mind quite a bit. LCSW validated and normalized Artia's feelings and encouraged her to utilize mindfulness and meditation in order to calm herself when she has these thoughts. We walked through examples of how she could best do this. Jowanna expressed understanding and agreement with this information. Robertta moved on to discussing  her roommate, and how she has continued not to do her part around the house. LCSW asked if Erza had the conversation with her about expectations, which she reported she had done. LCSW encouraged Johnie to revisit that conversation and remind her roommate why she needs assistance and why she feels resentful in the situation. Cherith expressed understanding and agreement with this information as well. LCSW held space for Alverna to vent her frustrations and offered insight where appropriate.   Suicidal/Homicidal: No  Therapist Response: Emmanuelle continues to work towards her tx goals but has not yet reached them. We will continue to work on improving emotional regulation and distress tolerance. Jeraldine has made progress in her communication skills and assertiveness.   Plan: Return again in 4 weeks.  Diagnosis: Axis I: Bipolar disorder    Axis II: No diagnosis    Heidi Dach, LCSW 01/04/2020

## 2020-02-01 ENCOUNTER — Other Ambulatory Visit: Payer: Self-pay

## 2020-02-01 ENCOUNTER — Ambulatory Visit (INDEPENDENT_AMBULATORY_CARE_PROVIDER_SITE_OTHER): Payer: 59 | Admitting: Licensed Clinical Social Worker

## 2020-02-01 ENCOUNTER — Ambulatory Visit (INDEPENDENT_AMBULATORY_CARE_PROVIDER_SITE_OTHER): Payer: 59 | Admitting: Psychiatry

## 2020-02-01 ENCOUNTER — Encounter: Payer: Self-pay | Admitting: Psychiatry

## 2020-02-01 ENCOUNTER — Encounter: Payer: Self-pay | Admitting: Licensed Clinical Social Worker

## 2020-02-01 DIAGNOSIS — F17200 Nicotine dependence, unspecified, uncomplicated: Secondary | ICD-10-CM | POA: Diagnosis not present

## 2020-02-01 DIAGNOSIS — F3162 Bipolar disorder, current episode mixed, moderate: Secondary | ICD-10-CM

## 2020-02-01 DIAGNOSIS — F3178 Bipolar disorder, in full remission, most recent episode mixed: Secondary | ICD-10-CM | POA: Diagnosis not present

## 2020-02-01 DIAGNOSIS — F4310 Post-traumatic stress disorder, unspecified: Secondary | ICD-10-CM

## 2020-02-01 MED ORDER — CLONAZEPAM 0.5 MG PO TABS: 0.5000 mg | ORAL_TABLET | ORAL | 0 refills | Status: DC

## 2020-02-01 MED ORDER — LAMOTRIGINE 25 MG PO TABS: 25.0000 mg | ORAL_TABLET | Freq: Every day | ORAL | 1 refills | Status: DC

## 2020-02-01 NOTE — Progress Notes (Signed)
]  Virtual Visit via Telephone Note  I connected with Debra [name] on 02/01/20 at 10:00 AM EST by telephone and verified that I am speaking with the correct person using two identifiers.   I discussed the limitations, risks, security and privacy concerns of performing an evaluation and management service by telephone and the availability of in person appointments. I also discussed with the patient that there may be a patient responsible charge related to this service. The patient expressed understanding and agreed to proceed.  I discussed the assessment and treatment plan with the patient. The patient was provided an opportunity to ask questions and all were answered. The patient agreed with the plan and demonstrated an understanding of the instructions.   The patient was advised to call back or seek an in-person evaluation if the symptoms worsen or if the condition fails to improve as anticipated.  I provided 45 minutes of non-face-to-face time during this encounter.   Heidi Dach, LCSW    THERAPIST PROGRESS NOTE  Session Time: 1000  Participation Level: Active  Behavioral Response: CasualAlertAnxious  Type of Therapy: Individual Therapy  Treatment Goals addressed: Coping  Interventions: DBT  Summary: Debra [name] is a 59 y.o. female who presents with continued symptoms related to her diagnosis. Debra [name] reports doing well since our last session, but notes things in her household have not improved at all. "They're still just not helping me do anything. They'll watch me run all around the house and not offer to get up and help me do anything." LCSW validated Debra [name]'s frustrations and encouraged her to continue revisiting the conversation about her expectations for the household and what she needs from them. We reviewed how to best utilize assertive communication in order to ensure she is being firm but also paying attention to how she delivers the message. Debra [name] expressed understanding and  agreement. Debra [name] reports an increase in crying recently, and noted feeling her emotions are "out of control." LCSW encouraged Debra [name] to utilize grounding techniques when feeling out of control in order to refocus her attention. LCSW also shared these resources via email so Debra [name] could reference them after sessions. Debra [name] expressed agreement and understanding with the information presented as well.   Suicidal/Homicidal: No  Therapist Response: Debra [name] continues to work towards her treatment goals but has not yet reached them. We will continue to work on improving emotional regulation skills and distress tolerance moving forward.   Plan: Return again in 2 weeks.  Diagnosis: Axis I: Bipolar Disorder    Axis II: No diagnosis    Heidi Dach, LCSW 02/01/2020

## 2020-02-01 NOTE — Progress Notes (Signed)
Provider Location : [facility] Patient Location : Home   Virtual Visit via Video Note  I connected with Debra [name] on 02/01/20 at  2:20 PM EST by a video enabled telemedicine application and verified that I am speaking with the correct person using two identifiers.   I discussed the limitations of evaluation and management by telemedicine and the availability of in person appointments. The patient expressed understanding and agreed to proceed.    I discussed the assessment and treatment plan with the patient. The patient was provided an opportunity to ask questions and all were answered. The patient agreed with the plan and demonstrated an understanding of the instructions.   The patient was advised to call back or seek an in-person evaluation if the symptoms worsen or if the condition fails to improve as anticipated.   [place] MD[facility]/NP OP Progress Note  02/01/2020 5:21 PM Debra [name]  MRN:  [id number]  Chief Complaint:  Chief Complaint    Follow-up     HPI: Debra [name] is a 59 year old Caucasian female, married, on disability, lives in [place], has a history of bipolar disorder, PTSD, tobacco use disorder, parkinsonian tremors, chronic back pain, OSA, hypertension, possible REM sleep behavior disorder was evaluated by telemedicine today.  Patient today reports she is currently struggling with a lot of anxiety symptoms as well as mood swings.  She reports she does have psychosocial stressors of her mother being hospitalized.  Her mother is currently undergoing cardiac surgery and she is worried about her.  Patient reports she has been face timing with her however has not been able to visit her in person yet.  That worries her.  She reports being restless, nervous, fidgety often.  Patient reports she is compliant on her medications as prescribed.  Patient denies any suicidality, homicidality or perceptual disturbances.  Patient continues to be in psychotherapy sessions and reports she is going to  follow-up with her therapist more regularly now and it has been beneficial.  Patient denies any other concerns today. Visit Diagnosis:    ICD-10-CM   1. Bipolar 1 disorder, mixed, moderate (HCC)  F31.62 lamoTRIgine (LAMICTAL) 25 MG tablet    clonazePAM (KLONOPIN) 0.5 MG tablet  2. PTSD (post-traumatic stress disorder)  F43.10 clonazePAM (KLONOPIN) 0.5 MG tablet  3. Tobacco use disorder  F17.200     Past Psychiatric History: I have reviewed past psychiatric history from my progress note on 02/16/2019.  Past trials of Paxil, Depakote, Haldol, trazodone, melatonin  Past Medical History:  Past Medical History:  Diagnosis Date  . Anxiety   . Arthritis   . C. difficile colitis   . C. difficile colitis   . Depression   . Diabetes mellitus without complication ([place])    Type II  . Diabetes mellitus, type II ([place])   . Family history of adverse reaction to anesthesia    Mother - BP drops  . Head injury, closed, with brief LOC ([place]) 2011ish  . Hyperlipidemia   . Hypertension   . Insomnia   . Myalgia 03/29/2018   Due to statin  . Obesity   . Parkinson's disease ([place])   . Seasonal allergies   . Seizures ([place])   . Tobacco abuse     Past Surgical History:  Procedure Laterality Date  . ABDOMINAL HYSTERECTOMY  2012  . BREAST BIOPSY Right   . CESAREAN SECTION  1992  . CHOLECYSTECTOMY  2013  . LUMBAR LAMINECTOMY/DECOMPRESSION MICRODISCECTOMY Left 09/10/2015   Procedure: Left L4-5 Foraminotomy/Left L5-S1 Diskectomy;  Surgeon: Leeroy Cha, MD;  Location: [facility] NEURO ORS;  Service: Neurosurgery;  Laterality: Left;  Left L4-5 Foraminotomy/Left L5-S1 Diskectomy    Family Psychiatric History: I have reviewed family psychiatric history from my progress note on 02/16/2019.  Family History:  Family History  Problem Relation Age of Onset  . COPD Mother   . Bipolar disorder Father   . Diabetes Mellitus II Father   . Bipolar disorder Brother   . Diabetes Mellitus II Brother   . Bipolar disorder  Brother     Social History: I have reviewed social history from my progress note on 02/16/2019. Social History   Socioeconomic History  . Marital status: Married    Spouse name: [name]  . Number of children: 1  . Years of education: Not on file  . Highest education level: Associate degree: occupational, Hotel manager, or vocational program  Occupational History  . Occupation: Disabled  Tobacco Use  . Smoking status: Current Every Day Smoker    Packs/day: 0.50    Years: 24.00    Pack years: 12.00    Types: Cigarettes    Start date: 03/19/2004  . Smokeless tobacco: Never Used  Substance and Sexual Activity  . Alcohol use: Yes    Alcohol/week: 0.0 standard drinks    Comment: occasional- rare  . Drug use: No  . Sexual activity: Not Currently  Other Topics Concern  . Not on file  Social History Narrative   Live in private residence w/ husband and their friend who is wheelchair bound, her end of the house is wheelchair accessible.    Social Determinants of Health   Financial Resource Strain: Low Risk   . Difficulty of Paying Living Expenses: Not hard at all  Food Insecurity: Food Insecurity Present  . Worried About Charity fundraiser in the Last Year: Sometimes true  . Ran Out of Food in the Last Year: Never true  Transportation Needs:   . Lack of Transportation (Medical): Not on file  . Lack of Transportation (Non-Medical): Not on file  Physical Activity:   . Days of Exercise per Week: Not on file  . Minutes of Exercise per Session: Not on file  Stress: Stress Concern Present  . Feeling of Stress : To some extent  Social Connections: Unknown  . Frequency of Communication with Friends and Family: More than three times a week  . Frequency of Social Gatherings with Friends and Family: Not on file  . Attends Religious Services: Never  . Active Member of Clubs or Organizations: Not on file  . Attends Archivist Meetings: Not on file  . Marital Status: Not on file     Allergies:  Allergies  Allergen Reactions  . Clindamycin/Lincomycin     Pt states caused C-Diff  . Metformin And Related Diarrhea  . Benadryl [Diphenhydramine Hcl] Swelling    blistering  . Canagliflozin Other (See Comments)    Other reaction(s): Other (See Comments) Low bp Low bp Low bp  . Depakote Er [Divalproex Sodium Er] Other (See Comments)    seizures  . Dilaudid [Hydromorphone Hcl] Other (See Comments)    seizures  . Diphenhydramine-Zinc Acetate Other (See Comments)  . Erythromycin   . Ethanol   . Haloperidol Other (See Comments)  . Hydromorphone Other (See Comments)  . Neosporin [Neomycin-Bacitracin Zn-Polymyx] Swelling    blistering  . Singulair [Montelukast Sodium] Other (See Comments)    Asthma attacks  . Sitagliptin Other (See Comments)  . Statins Other (See Comments)  Other reaction(s): Unknown  . Sulfa Antibiotics Hives    Other reaction(s): Unknown  . Victoza [Liraglutide] Other (See Comments) and Nausea And Vomiting    Other reaction(s): Other (See Comments) pancreatitis pancreatitis    Metabolic Disorder Labs: Lab Results  Component Value Date   HGBA1C 8.3 (H) 09/21/2019   MPG 192 09/21/2019   MPG 214 06/16/2019   No results found for: PROLACTIN Lab Results  Component Value Date   CHOL 98 09/21/2019   TRIG 167 (H) 09/21/2019   HDL 49 (L) 09/21/2019   CHOLHDL 2.0 09/21/2019   LDLCALC 24 09/21/2019   LDLCALC 49 06/16/2019   Lab Results  Component Value Date   TSH 1.671 02/16/2019   TSH 2.160 08/20/2015    Therapeutic Level Labs: No results found for: LITHIUM No results found for: VALPROATE No components found for:  CBMZ  Current Medications: Current Outpatient Medications  Medication Sig Dispense Refill  . aspirin EC 81 MG tablet Take 81 mg by mouth daily.    . blood glucose meter kit and supplies Use up to four times daily as directed, Dx E11.65, LON 99 months 1 each 0  . Calcium Carbonate-Vitamin D 600-200 MG-UNIT TABS  Take by mouth.    . Cholecalciferol 5000 units capsule Take 5,000 Units by mouth.     . clonazePAM (KLONOPIN) 0.5 MG tablet Take by mouth.    . clonazePAM (KLONOPIN) 0.5 MG tablet Take 1 tablet (0.5 mg total) by mouth as directed. Take one tablet once a day as needed up to 2-3 times a week only for severe anxiety attacks 12 tablet 0  . Evolocumab (REPATHA SURECLICK) 295 MG/ML SOAJ Inject 140 mg into the skin every 14 (fourteen) days. 6 pen 2  . FARXIGA 10 MG TABS tablet TAKE 1 TABLET DAILY (HIGHER DOSE) 90 tablet 3  . folic acid (FOLVITE) 188 MCG tablet Take 800 mcg by mouth daily.     [name] glucose blood (ONE TOUCH ULTRA TEST) test strip USE UP TO FOUR TIMES A DAY AS DIRECTED; Dx 11.51, LON 99 months 300 each 3  . HUMALOG KWIKPEN 100 UNIT/ML KwikPen Give subcutaneously with meals, 20 units in the AM and 35 units with lunch and evening meal 27 pen 5  . hydrochlorothiazide (HYDRODIURIL) 25 MG tablet TAKE 1 TABLET DAILY (DOSE INCREASE, REPLACES ALL OTHER HCTZ PRESCRIPTIONS) 90 tablet 3  . HYDROcodone-acetaminophen (NORCO/VICODIN) 5-325 MG tablet TAKE 1 2 TABLETS BY MOUTH EVERY 6 HOURS AS NEEDED FOR PAIN    . Insulin Pen Needle 29G X 12.7MM MISC 1 each by Does not apply route as needed. 100 each 3  . lamoTRIgine (LAMICTAL) 100 MG tablet Take 1 tablet (100 mg total) by mouth daily. 90 tablet 1  . lamoTRIgine (LAMICTAL) 25 MG tablet Take 1-2 tablets (25-50 mg total) by mouth daily. Start taking 25 mg for 2 weeks and increase to 50 mg after that ( combine with 100 mg) 60 tablet 1  . Lancets (ONETOUCH DELICA PLUS CZYSAY30Z) MISC CHECK FINGER STICK BLOOD SUGARS FOUR TIMES A DAY 300 each 3  . lisinopril (PRINIVIL,ZESTRIL) 40 MG tablet Take 1 tablet (40 mg total) by mouth daily. 90 tablet 3  . Melatonin 10 MG TABS Take 1 tablet by mouth at bedtime.    . Multiple Vitamins-Minerals (CENTRUM WOMEN) TABS Take 1 tablet by mouth.    [name] PARoxetine (PAXIL) 40 MG tablet TAKE 1 TABLET EVERY MORNING 90 tablet 1  .  pioglitazone (ACTOS) 30 MG tablet Take 1 tablet (  30 mg total) by mouth daily. 90 tablet 3  . propranolol (INNOPRAN XL) 80 MG 24 hr capsule TAKE 1 CAPSULE DAILY    . TRESIBA FLEXTOUCH 200 UNIT/ML SOPN Inject 120 Units into the skin at bedtime. (Patient taking differently: Inject 120 Units into the skin every morning. ) 36 pen 5   No current facility-administered medications for this visit.     Musculoskeletal: Strength & Muscle Tone: UTA Gait & Station: Seated Patient leans: N/A  Psychiatric Specialty Exam: Review of Systems  Psychiatric/Behavioral: Positive for dysphoric mood. The patient is nervous/anxious.   All other systems reviewed and are negative.   There were no vitals taken for this visit.There is no height or weight on file to calculate BMI.  General Appearance: Casual  Eye Contact:  Fair  Speech:  Clear and Coherent  Volume:  Normal  Mood:  Anxious and Dysphoric  Affect:  Tearful  Thought Process:  Goal Directed and Descriptions of Associations: Intact  Orientation:  Full (Time, Place, and Person)  Thought Content: Logical   Suicidal Thoughts:  No  Homicidal Thoughts:  No  Memory:  Immediate;   Fair Recent;   Fair Remote;   Fair  Judgement:  Fair  Insight:  Fair  Psychomotor Activity:  Normal  Concentration:  Concentration: Fair and Attention Span: Fair  Recall:  [employer] of Knowledge: Fair  Language: Fair  Akathisia:  No  Handed:  Right  AIMS (if indicated): UTA  Assets:  Communication Skills Desire for Improvement Housing Social Support  ADL's:  Intact  Cognition: WNL  Sleep:  Fair   Screenings: Mini-Mental     [facility]21/[facility]  Total Score (max 30 points )  27    PHQ2-9     Video Visit from 12/06/2019 in [facility] Office Visit from 09/21/2019 in [facility] Office Visit from 09/07/2019 in [facility] Office Visit from 06/13/2019 in [facility] Office Visit from 05/24/2019 in [facility]  PHQ-2 Total Score  0  0  0  0  0  PHQ-9 Total Score  0  0  0  0  0       Assessment and Plan: Debra [name] is a 59 year old Caucasian female, married, disabled, lives in [place], has a history of bipolar disorder, PTSD, hypertension, diabetes melitis, OSA on CPAP, chronic pain was evaluated by telemedicine today.  Patient is biologically predisposed given her history of trauma, family history of mental health problems.  She also has psychosocial stressors of the pandemic as well as financial issues.  Patient is currently struggling with mood lability and will benefit from medication readjustment.  She will continue to benefit from psychotherapy sessions.  Plan as noted below.  Plan Bipolar disorder mixed-unstable Increase lamotrigine to 125 mg p.o. daily for 2 weeks and then to 150 mg p.o. daily. Paxil 40 mg p.o. daily. Discussed antipsychotic medications like Abilify however patient reports she does not want to be on any medications that can affect her diabetes.  Her most recent hemoglobin A1c was elevated. Start Klonopin 0.5 mg p.o. as needed once a day up to 2-3 times a week for severe anxiety attacks only.  Patient advised to limit use. I have reviewed [place] controlled substance database.  PTSD-stable Paxil as prescribed Continue CBT with Ms. Alden Hipp  Insomnia-stable Melatonin as prescribed Continue CPAP for OSA  Tobacco use disorder-improving She is currently cutting back.  Provided smoking cessation counseling.  Follow-up in clinic in 2 to 3 weeks or sooner if needed.   I have spent atleast 20 minutes non face to face with patient today. More than 50 % of the time was spent for  ordering medications and test ,psychoeducation and supportive psychotherapy and care coordination,as well as documenting clinical information in electronic health record. This note was generated in part or whole with  voice recognition software. Voice recognition is usually quite accurate but there are transcription errors that can and very often do occur. I apologize for any typographical errors that were not detected and corrected.       Ursula Alert, MD 02/01/2020, 5:21 PM

## 2020-02-14 ENCOUNTER — Encounter: Payer: Self-pay | Admitting: Licensed Clinical Social Worker

## 2020-02-14 ENCOUNTER — Other Ambulatory Visit: Payer: Self-pay

## 2020-02-14 ENCOUNTER — Ambulatory Visit (INDEPENDENT_AMBULATORY_CARE_PROVIDER_SITE_OTHER): Payer: 59 | Admitting: Licensed Clinical Social Worker

## 2020-02-14 DIAGNOSIS — F3178 Bipolar disorder, in full remission, most recent episode mixed: Secondary | ICD-10-CM | POA: Diagnosis not present

## 2020-02-14 NOTE — Progress Notes (Signed)
  Virtual Visit via Video Note  I connected with Debra [name] on 02/14/20 at  9:00 AM EST by a video enabled telemedicine application and verified that I am speaking with the correct person using two identifiers.   I discussed the limitations of evaluation and management by telemedicine and the availability of in person appointments. The patient expressed understanding and agreed to proceed.  I discussed the assessment and treatment plan with the patient. The patient was provided an opportunity to ask questions and all were answered. The patient agreed with the plan and demonstrated an understanding of the instructions.   The patient was advised to call back or seek an in-person evaluation if the symptoms worsen or if the condition fails to improve as anticipated.  I provided 30 minutes of non-face-to-face time during this encounter.   Heidi Dach, LCSW   THERAPIST PROGRESS NOTE  Session Time: 0900  Participation Level: Active  Behavioral Response: CasualAlertDepressed  Type of Therapy: Individual Therapy  Treatment Goals addressed: Coping  Interventions: Strength-based and Supportive  Summary: Debra [name] is a 59 y.o. female who presents with continued symptoms related to her diagnosis. Debra [name] reports doing well since our last session. She reports she was able to have a conversation with her husband and their roommate regarding needing help around the house. She reports her husband was very receptive, and has been trying to help more around the house, and be more attentive emotionally as well. LCSW validated Debra [name]'s experience and asked her if the roommate was receptive. Debra [name] reports she was receptive at the time, but nothing has changed, "she still tells me when I need to mop and scrub the floors." LCSW encouraged Debra [name] to set boundaries when comments like that are made, in order to assert she is not an employee, and therefore should not be treated as such. Debra [name] expressed agreement with this  sentiment, but noted feeling defeated within this situation as she feels nothing will change. LCSW validated Debra [name]'s feelings and suggested that nothing will change if we do nothing--but there is at least the possibility of change if we try. Debra [name] expressed agreement She notes her fiery garden is going well, and she has enjoyed spending time rearranging the figurines and focusing her attention on that project. LCSW suggested Debra [name] borrow from sand tray therapy, and attempt to work through situations utilizing her garden. Debra [name] was in agreement with this plan.   Suicidal/Homicidal: No  Therapist Response: Debra [name] continues to work towards her tx goals but has not yet reached them. We will continue to work on improving emotional regulation skills and distress tolerance moving forward.   Plan: Return again in 4 weeks.  Diagnosis: Axis I: Bipolar Disorder    Axis II: No diagnosis    Heidi Dach, LCSW 02/14/2020

## 2020-02-15 ENCOUNTER — Other Ambulatory Visit: Payer: Self-pay | Admitting: Family Medicine

## 2020-02-16 ENCOUNTER — Other Ambulatory Visit (HOSPITAL_COMMUNITY)
Admission: RE | Admit: 2020-02-16 | Discharge: 2020-02-16 | Disposition: A | Discharge status: Home / Self Care | Payer: Managed Care, Other (non HMO) | Source: Ambulatory Visit | Attending: Family Medicine | Admitting: Family Medicine

## 2020-02-16 ENCOUNTER — Other Ambulatory Visit: Payer: Self-pay

## 2020-02-16 ENCOUNTER — Encounter: Payer: Self-pay | Admitting: Family Medicine

## 2020-02-16 ENCOUNTER — Ambulatory Visit (INDEPENDENT_AMBULATORY_CARE_PROVIDER_SITE_OTHER): Payer: Managed Care, Other (non HMO) | Admitting: Family Medicine

## 2020-02-16 VITALS — BP 122/84 | HR 82 | Temp 97.5°F | Resp 14 | Ht 65.5 in | Wt 293.8 lb

## 2020-02-16 DIAGNOSIS — R208 Other disturbances of skin sensation: Secondary | ICD-10-CM | POA: Diagnosis not present

## 2020-02-16 DIAGNOSIS — I872 Venous insufficiency (chronic) (peripheral): Secondary | ICD-10-CM

## 2020-02-16 DIAGNOSIS — N761 Subacute and chronic vaginitis: Secondary | ICD-10-CM

## 2020-02-16 DIAGNOSIS — M79604 Pain in right leg: Secondary | ICD-10-CM | POA: Diagnosis not present

## 2020-02-16 DIAGNOSIS — M79605 Pain in left leg: Secondary | ICD-10-CM

## 2020-02-16 MED ORDER — PREGABALIN 25 MG PO CAPS: 25.0000 mg | ORAL_CAPSULE | Freq: Two times a day (BID) | ORAL | 3 refills | Status: DC

## 2020-02-16 MED ORDER — FLUCONAZOLE 150 MG PO TABS: 150.0000 mg | ORAL_TABLET | Freq: Once | ORAL | 0 refills | Status: DC

## 2020-02-16 MED ORDER — FLUCONAZOLE 150 MG PO TABS: 150.0000 mg | ORAL_TABLET | ORAL | 4 refills | Status: DC | PRN

## 2020-02-16 MED ORDER — CLOTRIMAZOLE-BETAMETHASONE 1-0.05 % EX CREA: 1.0000 "application " | TOPICAL_CREAM | Freq: Two times a day (BID) | CUTANEOUS | 1 refills | Status: DC

## 2020-02-16 NOTE — Patient Instructions (Signed)
Try a magnesium supplement, a B- complex supplement and low dose iron supplement these can help leg nerve pain symptoms  We'll try and get lyrica for you to treat the pain

## 2020-02-16 NOTE — Progress Notes (Signed)
Name: Debra [name]   MRN: [id number]    DOB: 20-Jan-1961   Date:02/16/2020       Progress Note  Chief Complaint  Patient presents with  . Vaginitis    itching, onset 2 weeks  . Leg Pain    Bilateral, states her skin hurts, onset 1+ months     Subjective:   Debra [name] is a 59 y.o. female, presents to clinic for routine follow up on the conditions listed above.   Patient complains of several weeks of vaginitis which is chronic and recurrent over the past several months.  She denies any swelling rash or lesions, she denies any malodorous vaginal discharge.  Her entire vulvovaginal area is severely irritated and itching despite using multiple over-the-counter Monistat and vaginal yeast creams.  She has done a vaginal self swab, she denies any urinary symptoms.  She denies any rash in any other skin folds  She also complains of severe lower leg pain that she can feel throughout her skin.  She states that the pain is severe feels like nerve pain and so severe that she is in tears to when attempting to shave.  She has tried gabapentin in the past but it caused severe leg cramps.  She denies any muscle cramps with ambulation.  Her pain is severe all the time and has been much worse over the last month or so.  She does have a history of venous insufficiency and has extensive reticular and varicose veins to her lower legs bilaterally.  She states that she has been to vascular her last appointment was in the last 1 to 2 months.    Patient Active Problem List   Diagnosis Date Noted  . Pain due to onychomycosis of toenails of both feet 09/11/2019  . Pincer nail deformity 09/11/2019  . Bipolar 1 disorder, mixed, moderate ([place]) 08/30/2019  . PTSD (post-traumatic stress disorder) 08/30/2019  . Tobacco use disorder 08/30/2019  . Obstructive sleep apnea 11/18/2018  . Parkinsonian tremor ([place]) 11/18/2018  . RBD (REM behavioral disorder) 05/19/2018  . Morbid obesity ([place]) 05/03/2018  . Osteopenia  03/16/2018  . Abdominal aortic atherosclerosis ([place]) 03/04/2018  . Adverse reaction to statin medication 03/04/2018  . Uncontrolled diabetes mellitus type 2 with atherosclerosis of arteries of extremities ([place]) 03/04/2018  . Hyperlipidemia 04/20/2017  . Lumbar disc disease with radiculopathy 08/20/2015  . Lumbar herniated disc 08/20/2015  . Left-sided low back pain with sciatica 06/03/2015  . Bipolar disorder ([place]) 06/03/2015  . Insomnia, persistent 06/03/2015  . BP (high blood pressure) 06/03/2015  . Compulsive tobacco user syndrome 06/03/2015  . Phlebectasia 06/03/2015    Past Surgical History:  Procedure Laterality Date  . ABDOMINAL HYSTERECTOMY  2012  . BREAST BIOPSY Right   . CESAREAN SECTION  1992  . CHOLECYSTECTOMY  2013  . LUMBAR LAMINECTOMY/DECOMPRESSION MICRODISCECTOMY Left 09/10/2015   Procedure: Left L4-5 Foraminotomy/Left L5-S1 Diskectomy;  Surgeon: Leeroy Cha, MD;  Location: [facility];  Service: Neurosurgery;  Laterality: Left;  Left L4-5 Foraminotomy/Left L5-S1 Diskectomy    Family History  Problem Relation Age of Onset  . COPD Mother   . Bipolar disorder Father   . Diabetes Mellitus II Father   . Bipolar disorder Brother   . Diabetes Mellitus II Brother   . Bipolar disorder Brother     Social History   Tobacco Use  . Smoking status: Current Every Day Smoker    Packs/day: 0.50    Years: 24.00    Pack years:  12.00    Types: Cigarettes    Start date: 03/19/2004  . Smokeless tobacco: Never Used  Substance Use Topics  . Alcohol use: Yes    Alcohol/week: 0.0 standard drinks    Comment: occasional- rare  . Drug use: No      Current Outpatient Medications:  .  aspirin EC 81 MG tablet, Take 81 mg by mouth daily., Disp: , Rfl:  .  Calcium Carbonate-Vitamin D 600-200 MG-UNIT TABS, Take by mouth., Disp: , Rfl:  .  Cholecalciferol 5000 units capsule, Take 5,000 Units by mouth. , Disp: , Rfl:  .  clonazePAM (KLONOPIN) 0.5 MG tablet, Take 1 tablet (0.5  mg total) by mouth as directed. Take one tablet once a day as needed up to 2-3 times a week only for severe anxiety attacks, Disp: 12 tablet, Rfl: 0 .  Evolocumab (REPATHA SURECLICK) 361 MG/ML SOAJ, Inject 140 mg into the skin every 14 (fourteen) days., Disp: 6 pen, Rfl: 2 .  FARXIGA 10 MG TABS tablet, TAKE 1 TABLET DAILY (HIGHER DOSE), Disp: 90 tablet, Rfl: 3 .  folic acid (FOLVITE) 443 MCG tablet, Take 800 mcg by mouth daily. , Disp: , Rfl:  .  HUMALOG KWIKPEN 100 UNIT/ML KwikPen, Give subcutaneously with meals, 20 units in the AM and 35 units with lunch and evening meal, Disp: 27 pen, Rfl: 5 .  hydrochlorothiazide (HYDRODIURIL) 25 MG tablet, TAKE 1 TABLET DAILY (DOSE INCREASE, REPLACES ALL OTHER HCTZ PRESCRIPTIONS), Disp: 90 tablet, Rfl: 3 .  HYDROcodone-acetaminophen (NORCO/VICODIN) 5-325 MG tablet, TAKE 1 2 TABLETS BY MOUTH EVERY 6 HOURS AS NEEDED FOR PAIN, Disp: , Rfl:  .  Insulin Pen Needle 29G X 12.7MM MISC, 1 each by Does not apply route as needed., Disp: 100 each, Rfl: 3 .  lamoTRIgine (LAMICTAL) 100 MG tablet, Take 1 tablet (100 mg total) by mouth daily., Disp: 90 tablet, Rfl: 1 .  lamoTRIgine (LAMICTAL) 25 MG tablet, Take 1-2 tablets (25-50 mg total) by mouth daily. Start taking 25 mg for 2 weeks and increase to 50 mg after that ( combine with 100 mg), Disp: 60 tablet, Rfl: 1 .  Lancets (ONETOUCH DELICA PLUS XVQMGQ67Y) MISC, CHECK FINGER STICK BLOOD SUGARS FOUR TIMES A DAY, Disp: 300 each, Rfl: 3 .  lisinopril (PRINIVIL,ZESTRIL) 40 MG tablet, Take 1 tablet (40 mg total) by mouth daily., Disp: 90 tablet, Rfl: 3 .  Melatonin 10 MG TABS, Take 1 tablet by mouth at bedtime., Disp: , Rfl:  .  Multiple Vitamins-Minerals (CENTRUM WOMEN) TABS, Take 1 tablet by mouth., Disp: , Rfl:  .  PARoxetine (PAXIL) 40 MG tablet, TAKE 1 TABLET EVERY MORNING, Disp: 90 tablet, Rfl: 1 .  pioglitazone (ACTOS) 30 MG tablet, TAKE 1 TABLET DAILY, Disp: 90 tablet, Rfl: 0 .  propranolol (INNOPRAN XL) 80 MG 24 hr  capsule, TAKE 1 CAPSULE DAILY, Disp: , Rfl:  .  TRESIBA FLEXTOUCH 200 UNIT/ML SOPN, Inject 120 Units into the skin at bedtime. (Patient taking differently: Inject 120 Units into the skin every morning. ), Disp: 36 pen, Rfl: 5 .  blood glucose meter kit and supplies, Use up to four times daily as directed, Dx E11.65, LON 99 months, Disp: 1 each, Rfl: 0 .  clonazePAM (KLONOPIN) 0.5 MG tablet, Take by mouth., Disp: , Rfl:  .  glucose blood (ONE TOUCH ULTRA TEST) test strip, USE UP TO FOUR TIMES A DAY AS DIRECTED; Dx 11.51, LON 99 months, Disp: 300 each, Rfl: 3  Allergies  Allergen Reactions  .  Clindamycin/Lincomycin     Pt states caused C-Diff  . Metformin And Related Diarrhea  . Benadryl [Diphenhydramine Hcl] Swelling    blistering  . Canagliflozin Other (See Comments)    Other reaction(s): Other (See Comments) Low bp Low bp Low bp  . Depakote Er [Divalproex Sodium Er] Other (See Comments)    seizures  . Dilaudid [Hydromorphone Hcl] Other (See Comments)    seizures  . Diphenhydramine-Zinc Acetate Other (See Comments)  . Erythromycin   . Ethanol   . Haloperidol Other (See Comments)  . Hydromorphone Other (See Comments)  . Neosporin [Neomycin-Bacitracin Zn-Polymyx] Swelling    blistering  . Singulair [Montelukast Sodium] Other (See Comments)    Asthma attacks  . Sitagliptin Other (See Comments)  . Statins Other (See Comments)    Other reaction(s): Unknown  . Sulfa Antibiotics Hives    Other reaction(s): Unknown  . Victoza [Liraglutide] Other (See Comments) and Nausea And Vomiting    Other reaction(s): Other (See Comments) pancreatitis pancreatitis    Chart Review Today: I personally reviewed active problem list, medication list, allergies, family history, social history, health maintenance, notes from last encounter, lab results, imaging with the patient/caregiver today.   Review of Systems  Constitutional: Negative.   HENT: Negative.   Eyes: Negative.   Respiratory:  Negative.   Cardiovascular: Negative.   Gastrointestinal: Negative.   Endocrine: Negative.  Negative for cold intolerance and heat intolerance.  Genitourinary: Negative for decreased urine volume, difficulty urinating, dysuria, flank pain, frequency, hematuria, menstrual problem, pelvic pain, urgency, vaginal bleeding, vaginal discharge and vaginal pain.  Musculoskeletal: Negative for arthralgias, joint swelling and myalgias.  Skin: Negative.  Negative for color change, pallor, rash and wound.  Allergic/Immunologic: Negative.  Negative for environmental allergies and immunocompromised state.  Neurological: Negative.   Hematological: Negative for adenopathy. Does not bruise/bleed easily.  Psychiatric/Behavioral: Negative.   All other systems reviewed and are negative.   10 Systems reviewed and are negative for acute change except as noted in the HPI.   Objective:    Vitals:   02/16/20 1058  BP: 122/84  Pulse: 82  Resp: 14  Temp: (!) 97.5 F (36.4 C)  SpO2: 94%  Weight: 293 lb 12.8 oz (133.3 kg)  Height: 5' 5.5" (1.664 m)    Body mass index is 48.15 kg/m.  Physical Exam Vitals and nursing note reviewed.  Constitutional:      Appearance: She is well-developed. She is morbidly obese. She is ill-appearing (chronically). She is not toxic-appearing or diaphoretic.     Interventions: Face mask in place.  HENT:     Head: Normocephalic and atraumatic.     Nose: Nose normal.  Eyes:     General:        Right eye: No discharge.        Left eye: No discharge.     Conjunctiva/sclera: Conjunctivae normal.  Neck:     Trachea: No tracheal deviation.  Cardiovascular:     Rate and Rhythm: Normal rate and regular rhythm.  Pulmonary:     Effort: Pulmonary effort is normal. No respiratory distress.     Breath sounds: No stridor.  Musculoskeletal:        General: Normal range of motion.  Skin:    General: Skin is warm and dry.     Findings: No lesion, rash or wound.     Comments:  No edema to bilateral lower extremities severely tender to even light touch of her legs, scattered large areas of reticular and  varicose veins do not see any obvious phlebitis  Neurological:     Mental Status: She is alert.     Motor: No abnormal muscle tone.     Coordination: Coordination normal.  Psychiatric:        Behavior: Behavior normal. Behavior is cooperative.       PHQ2/9: Depression screen [facility] 2/9 02/16/2020 12/06/2019 09/21/2019 09/07/2019 06/13/2019  Decreased Interest 3 0 0 0 0  Down, Depressed, Hopeless 2 0 0 0 0  PHQ - 2 Score 5 0 0 0 0  Altered sleeping 0 0 0 0 0  Tired, decreased energy 3 0 0 0 0  Change in appetite 1 0 0 0 0  Feeling bad or failure about yourself  1 0 0 0 0  Trouble concentrating 1 0 0 0 0  Moving slowly or fidgety/restless 0 0 0 0 0  Suicidal thoughts - 0 0 0 0  PHQ-9 Score 11 0 0 0 0  Difficult doing work/chores Somewhat difficult Not difficult at all Not difficult at all Not difficult at all Not difficult at all  Some recent data might be hidden    phq 9 is positive, encouraged pt to return to further discuss moods - most of score is from decreased sleep due to her current CC's  Fall Risk: Fall Risk  02/16/2020 12/06/2019 09/21/2019 09/07/2019 06/13/2019  Falls in the past year? 0 0 0 0 0  Number falls in past yr: 0 0 0 0 0  Injury with Fall? 0 0 0 0 0  Comment - - - - -  Risk Factor Category  - - - - -  Comment - - - - -  Risk for fall due to : - - - - -  Risk for fall due to: Comment - - - - -  Follow up - - - - -    Functional Status Survey: Is the patient deaf or have difficulty hearing?: No Does the patient have difficulty seeing, even when wearing glasses/contacts?: No Does the patient have difficulty concentrating, remembering, or making decisions?: No Does the patient have difficulty walking or climbing stairs?: No Does the patient have difficulty dressing or bathing?: No Does the patient have difficulty doing errands alone such  as visiting a doctor's office or shopping?: No   Assessment & Plan:   1. Subacute vaginitis Consistent with her history of yeast vaginitis, encouraged her to use antifungal antiyeast powders, keep skin folds dry, can try Lotrisone very sparingly, and Diflucan but I have instructed her to contact us if she is using more than 4 doses in a month History of diabetes -may be due to her medications encouraged her to follow-up with her endocrinologist to inquire about this - Cervicovaginal ancillary only - clotrimazole-betamethasone (LOTRISONE) cream; Apply 1 application topically 2 (two) times daily. Sparingly to affected skin for up to 7 d at a time  Dispense: 30 g; Refill: 1 - fluconazole (DIFLUCAN) 150 MG tablet; Take 1 tablet (150 mg total) by mouth every 3 (three) days as needed (for vaginal itching/yeast infection sx).  Dispense: 2 tablet; Refill: 4  2. Pain in both lower extremities Unclear etiology of her pain but in the differential is deficiency such as iron deficiency B12, may be from her venous stasis disease or possibly from some phlebitis, she is established with vascular specialist encouraged her to follow-up.  Encouraged her to try to treat her pain with supplements of magnesium, low-dose iron supplements and B complex or B12  supplements see if this improves her pain.  We will try treating pain with Lyrica starting with 25 mg twice daily she did not tolerate gabapentin, would titrate Lyrica to effective pain management without bothersome side effects - pregabalin (LYRICA) 25 MG capsule; Take 1 capsule (25 mg total) by mouth 2 (two) times daily.  Dispense: 60 capsule; Refill: 3 - US ARTERIAL ABI (SCREENING LOWER EXTREMITY)  3. Allodynia Severe tactile allodynia to bilateral lower extremities gentle palpation and even with light touch Tx pain with lyrica - pregabalin (LYRICA) 25 MG capsule; Take 1 capsule (25 mg total) by mouth 2 (two) times daily.  Dispense: 60 capsule; Refill: 3  4.  Venous insufficiency of both lower extremities Has established with vascular follow-up   F/up as needed  Delsa Grana, PA-C 02/16/20 11:18 AM

## 2020-02-19 ENCOUNTER — Other Ambulatory Visit: Payer: Self-pay | Admitting: Family Medicine

## 2020-02-19 DIAGNOSIS — Z1231 Encounter for screening mammogram for malignant neoplasm of breast: Secondary | ICD-10-CM

## 2020-02-19 LAB — CERVICOVAGINAL ANCILLARY ONLY
Bacterial Vaginitis (gardnerella): NEGATIVE
Candida Glabrata: NEGATIVE
Candida Vaginitis: POSITIVE — AB
Comment: NEGATIVE
Comment: NEGATIVE
Comment: NEGATIVE

## 2020-02-20 ENCOUNTER — Other Ambulatory Visit: Payer: Self-pay

## 2020-02-20 ENCOUNTER — Ambulatory Visit
Admission: EM | Admit: 2020-02-20 | Discharge: 2020-02-20 | Disposition: A | Discharge status: Home / Self Care | Payer: Medicare Other | Attending: Emergency Medicine | Admitting: Emergency Medicine

## 2020-02-20 DIAGNOSIS — R030 Elevated blood-pressure reading, without diagnosis of hypertension: Secondary | ICD-10-CM | POA: Diagnosis not present

## 2020-02-20 DIAGNOSIS — L0291 Cutaneous abscess, unspecified: Secondary | ICD-10-CM | POA: Diagnosis not present

## 2020-02-20 MED ORDER — DOXYCYCLINE HYCLATE 100 MG PO CAPS: 100.0000 mg | ORAL_CAPSULE | Freq: Two times a day (BID) | ORAL | 0 refills | Status: DC

## 2020-02-20 NOTE — ED Provider Notes (Signed)
Roderic Palau    CSN: [id number] Arrival date & time: 02/20/20  1258      History   Chief Complaint Chief Complaint  Patient presents with  . Abscess    HPI Debra [name] is a 59 y.o. female.  Patient presents with a painful, draining abscess on her right upper posterior thigh x 4 days.  She reports purulent drainage from the area.  She has been treating the area with routine cleaning and large absorbant bandages.  She denies fever or chills.  She states she has a history of skin abscesses.      The history is provided by the patient.    Past Medical History:  Diagnosis Date  . Anxiety   . Arthritis   . C. difficile colitis   . C. difficile colitis   . Depression   . Diabetes mellitus without complication ([place])    Type II  . Diabetes mellitus, type II ([place])   . Family history of adverse reaction to anesthesia    Mother - BP drops  . Head injury, closed, with brief LOC ([place]) 2011ish  . Hyperlipidemia   . Hypertension   . Insomnia   . Myalgia 03/29/2018   Due to statin  . Obesity   . Parkinson's disease ([place])   . Seasonal allergies   . Seizures ([place])   . Tobacco abuse     Patient Active Problem List   Diagnosis Date Noted  . Pain due to onychomycosis of toenails of both feet 09/11/2019  . Pincer nail deformity 09/11/2019  . Bipolar 1 disorder, mixed, moderate ([place]) 08/30/2019  . PTSD (post-traumatic stress disorder) 08/30/2019  . Tobacco use disorder 08/30/2019  . Obstructive sleep apnea 11/18/2018  . Parkinsonian tremor ([place]) 11/18/2018  . RBD (REM behavioral disorder) 05/19/2018  . Morbid obesity ([place]) 05/03/2018  . Osteopenia 03/16/2018  . Abdominal aortic atherosclerosis ([place]) 03/04/2018  . Adverse reaction to statin medication 03/04/2018  . Uncontrolled diabetes mellitus type 2 with atherosclerosis of arteries of extremities ([place]) 03/04/2018  . Hyperlipidemia 04/20/2017  . Lumbar disc disease with radiculopathy 08/20/2015  . Lumbar herniated  disc 08/20/2015  . Left-sided low back pain with sciatica 06/03/2015  . Bipolar disorder ([place]) 06/03/2015  . Insomnia, persistent 06/03/2015  . BP (high blood pressure) 06/03/2015  . Compulsive tobacco user syndrome 06/03/2015  . Phlebectasia 06/03/2015    Past Surgical History:  Procedure Laterality Date  . ABDOMINAL HYSTERECTOMY  2012  . BREAST BIOPSY Right   . CESAREAN SECTION  1992  . CHOLECYSTECTOMY  2013  . LUMBAR LAMINECTOMY/DECOMPRESSION MICRODISCECTOMY Left 09/10/2015   Procedure: Left L4-5 Foraminotomy/Left L5-S1 Diskectomy;  Surgeon: Leeroy Cha, MD;  Location: [facility];  Service: Neurosurgery;  Laterality: Left;  Left L4-5 Foraminotomy/Left L5-S1 Diskectomy    OB History    Gravida  1   Para  1   Term      Preterm      AB      Living        SAB      TAB      Ectopic      Multiple      Live Births               Home Medications    Prior to Admission medications   Medication Sig Start Date End Date Taking? Authorizing Provider  aspirin EC 81 MG tablet Take 81 mg by mouth daily.   Yes [provider]  blood  glucose meter kit and supplies Use up to four times daily as directed, Dx E11.65, LON 99 months 05/18/18  Yes Lada, Satira Anis, MD  Calcium Carbonate-Vitamin D 600-200 MG-UNIT TABS Take by mouth.   Yes [provider]  Cholecalciferol 5000 units capsule Take 5,000 Units by mouth.    Yes [provider]  clonazePAM (KLONOPIN) 0.5 MG tablet Take 1 tablet (0.5 mg total) by mouth as directed. Take one tablet once a day as needed up to 2-3 times a week only for severe anxiety attacks 02/01/20  Yes Eappen, Ria Clock, MD  clotrimazole-betamethasone (LOTRISONE) cream Apply 1 application topically 2 (two) times daily. Sparingly to affected skin for up to 7 d at a time 02/16/20  Yes Tapia, Leisa, PA-C  Evolocumab (REPATHA SURECLICK) 349 MG/ML SOAJ Inject 140 mg into the skin every 14 (fourteen) days. 11/10/19  Yes Gollan, Kathlene November, MD  FARXIGA 10 MG TABS tablet TAKE 1 TABLET DAILY (HIGHER DOSE) 07/03/19  Yes Poulose, Bethel Born, NP  fluconazole (DIFLUCAN) 150 MG tablet Take 1 tablet (150 mg total) by mouth every 3 (three) days as needed (for vaginal itching/yeast infection sx). 02/16/20  Yes Delsa Grana, PA-C  folic acid (FOLVITE) 179 MCG tablet Take 800 mcg by mouth daily.    Yes [provider]  glucose blood (ONE TOUCH ULTRA TEST) test strip USE UP TO FOUR TIMES A DAY AS DIRECTED; Dx 11.51, LON 99 months 03/20/19  Yes Lada, Satira Anis, MD  HUMALOG KWIKPEN 100 UNIT/ML KwikPen Give subcutaneously with meals, 20 units in the AM and 35 units with lunch and evening meal 03/09/19  Yes Lada, Satira Anis, MD  hydrochlorothiazide (HYDRODIURIL) 25 MG tablet TAKE 1 TABLET DAILY (DOSE INCREASE, REPLACES ALL OTHER HCTZ PRESCRIPTIONS) 08/04/19  Yes Poulose, Bethel Born, NP  HYDROcodone-acetaminophen (NORCO/VICODIN) 5-325 MG tablet TAKE 1 2 TABLETS BY MOUTH EVERY 6 HOURS AS NEEDED FOR PAIN 09/20/19  Yes [provider]  Insulin Pen Needle 29G X 12.7MM MISC 1 each by Does not apply route as needed. 04/03/19  Yes Hubbard Hartshorn, FNP  lamoTRIgine (LAMICTAL) 100 MG tablet Take 1 tablet (100 mg total) by mouth daily. 12/04/19  Yes Ursula Alert, MD  lamoTRIgine (LAMICTAL) 25 MG tablet Take 1-2 tablets (25-50 mg total) by mouth daily. Start taking 25 mg for 2 weeks and increase to 50 mg after that ( combine with 100 mg) 02/01/20  Yes Ursula Alert, MD  Lancets (ONETOUCH DELICA PLUS [id number]) MISC CHECK FINGER STICK BLOOD SUGARS FOUR TIMES A DAY 06/04/19  Yes Poulose, Bethel Born, NP  lisinopril (PRINIVIL,ZESTRIL) 40 MG tablet Take 1 tablet (40 mg total) by mouth daily. 03/20/19  Yes Lada, Satira Anis, MD  Melatonin 10 MG TABS Take 1 tablet by mouth at bedtime.   Yes [provider]  Multiple Vitamins-Minerals (CENTRUM WOMEN) TABS Take 1 tablet by mouth.   Yes [provider]  PARoxetine (PAXIL) 40 MG tablet TAKE 1  TABLET EVERY MORNING 12/04/19  Yes Eappen, Ria Clock, MD  pioglitazone (ACTOS) 30 MG tablet TAKE 1 TABLET DAILY 02/15/20  Yes Delsa Grana, PA-C  pregabalin (LYRICA) 25 MG capsule Take 1 capsule (25 mg total) by mouth 2 (two) times daily. 02/16/20  Yes Delsa Grana, PA-C  propranolol (INNOPRAN XL) 80 MG 24 hr capsule TAKE 1 CAPSULE DAILY 11/14/19  Yes [provider]  TRESIBA FLEXTOUCH 200 UNIT/ML SOPN Inject 120 Units into the skin at bedtime. Patient taking differently: Inject 120 Units into the skin every morning.  03/09/19  Yes Lada, Satira Anis, MD  clonazePAM (KLONOPIN) 0.5 MG tablet Take by mouth. 05/22/19 06/21/19  [provider]  doxycycline (VIBRAMYCIN) 100 MG capsule Take 1 capsule (100 mg total) by mouth 2 (two) times daily. 02/20/20   Sharion Balloon, NP    Family History Family History  Problem Relation Age of Onset  . COPD Mother   . Bipolar disorder Father   . Diabetes Mellitus II Father   . Bipolar disorder Brother   . Diabetes Mellitus II Brother   . Bipolar disorder Brother     Social History Social History   Tobacco Use  . Smoking status: Current Every Day Smoker    Packs/day: 0.50    Years: 24.00    Pack years: 12.00    Types: Cigarettes    Start date: 03/19/2004  . Smokeless tobacco: Never Used  Substance Use Topics  . Alcohol use: Yes    Alcohol/week: 0.0 standard drinks    Comment: occasional- rare  . Drug use: No     Allergies   Clindamycin/lincomycin, Metformin and related, Benadryl [diphenhydramine hcl], Canagliflozin, Depakote er [divalproex sodium er], Dilaudid [hydromorphone hcl], Diphenhydramine-zinc acetate, Erythromycin, Ethanol, Haloperidol, Hydromorphone, Neosporin [neomycin-bacitracin zn-polymyx], Singulair [montelukast sodium], Sitagliptin, Statins, Sulfa antibiotics, and Victoza [liraglutide]   Review of Systems Review of Systems  Constitutional: Negative for chills and fever.  HENT: Negative for ear pain and sore throat.     Eyes: Negative for pain and visual disturbance.  Respiratory: Negative for cough and shortness of breath.   Cardiovascular: Negative for chest pain and palpitations.  Gastrointestinal: Negative for abdominal pain and vomiting.  Genitourinary: Negative for dysuria and hematuria.  Musculoskeletal: Negative for arthralgias and back pain.  Skin: Positive for wound. Negative for color change and rash.  Neurological: Negative for seizures and syncope.  All other systems reviewed and are negative.    Physical Exam Triage Vital Signs ED Triage Vitals  Enc Vitals Group     BP      Pulse      Resp      Temp      Temp src      SpO2      Weight      Height      Head Circumference      Peak Flow      Pain Score      Pain Loc      Pain Edu?      Excl. in [place]?    No data found.  Updated Vital Signs BP (!) 170/91 (BP Location: Right Arm)   Pulse 65   Temp 98.8 F (37.1 C) (Oral)   Resp 18   Ht 5' 5.5" (1.664 m)   Wt 293 lb (132.9 kg)   SpO2 96%   BMI 48.02 kg/m   Visual Acuity Right Eye Distance:   Left Eye Distance:   Bilateral Distance:    Right Eye Near:   Left Eye Near:    Bilateral Near:     Physical Exam Vitals and nursing note reviewed.  Constitutional:      General: She is not in acute distress.    Appearance: She is well-developed.  HENT:     Head: Normocephalic and atraumatic.     Mouth/Throat:     Mouth: Mucous membranes are moist.     Pharynx: Oropharynx is clear.  Eyes:     Conjunctiva/sclera: Conjunctivae normal.  Cardiovascular:     Rate and Rhythm: Normal rate and regular  rhythm.     Heart sounds: No murmur.  Pulmonary:     Effort: Pulmonary effort is normal. No respiratory distress.     Breath sounds: Normal breath sounds.  Abdominal:     Palpations: Abdomen is soft.     Tenderness: There is no abdominal tenderness. There is no guarding or rebound.  Musculoskeletal:        General: Normal range of motion.     Cervical back: Neck supple.   Skin:    General: Skin is warm and dry.     Findings: Lesion present.     Comments: Right upper posterior thigh: 6 cm x 6 cm are of erythema with central induration and 2 cm open wound with purulent drainage.   Neurological:     General: No focal deficit present.     Mental Status: She is alert and oriented to person, place, and time.  Psychiatric:        Mood and Affect: Mood normal.        Behavior: Behavior normal.      UC Treatments / Results  Labs (all labs ordered are listed, but only abnormal results are displayed) Labs Reviewed - No data to display  EKG   Radiology No results found.  Procedures Procedures (including critical care time)  Medications Ordered in UC Medications - No data to display  Initial Impression / Assessment and Plan / UC Course  I have reviewed the triage vital signs and the nursing notes.  Pertinent labs & imaging results that were available during my care of the patient were reviewed by me and considered in my medical decision making (see chart for details).   Abscess. Elevated blood pressure reading.  Abscess is open and draining.  Additionally treating with doxycycline.  Wound care instructions discussed with patient.  Instructed her to follow-up with her PCP in 3 days for recheck of the abscess.  Also discussed that her blood pressure is elevated today and needs to be rechecked by her PCP in 2 to 4 weeks.  Patient agrees to plan of care.      Final Clinical Impressions(s) / UC Diagnoses   Final diagnoses:  Abscess  Elevated blood pressure reading     Discharge Instructions     Take the antibiotic as directed.  Keep the area clean and dry.  Wash it gently twice a day with soap and water.    Follow up with your primary care provider in 3 days for a recheck of the abscess.  Call to make an appointment.    Your blood pressure is elevated today at 170/91.  Please have this rechecked by your primary care provider in 2-4 weeks.            ED Prescriptions    Medication Sig Dispense Auth. Provider   doxycycline (VIBRAMYCIN) 100 MG capsule Take 1 capsule (100 mg total) by mouth 2 (two) times daily. 20 capsule Sharion Balloon, NP     PDMP not reviewed this encounter.   Sharion Balloon, NP 02/20/20 1408

## 2020-02-20 NOTE — Discharge Instructions (Addendum)
Take the antibiotic as directed.  Keep the area clean and dry.  Wash it gently twice a day with soap and water.    Follow up with your primary care provider in 3 days for a recheck of the abscess.  Call to make an appointment.    Your blood pressure is elevated today at 170/91.  Please have this rechecked by your primary care provider in 2-4 weeks.

## 2020-02-20 NOTE — ED Triage Notes (Signed)
Pt presents with c/o possible abscess to her right thigh, back side. She states the area has been present since Saturday. She has applied hot compresses and the area is now draining. She report pain to the area. Pt has had chills and nausea. She denies fever, emesis, diarrhea or other symptoms. She does have significant history of skin abscesses.

## 2020-02-21 ENCOUNTER — Ambulatory Visit (INDEPENDENT_AMBULATORY_CARE_PROVIDER_SITE_OTHER): Payer: 59 | Admitting: Psychiatry

## 2020-02-21 ENCOUNTER — Encounter: Payer: Self-pay | Admitting: Psychiatry

## 2020-02-21 ENCOUNTER — Other Ambulatory Visit: Payer: Self-pay

## 2020-02-21 DIAGNOSIS — F4310 Post-traumatic stress disorder, unspecified: Secondary | ICD-10-CM | POA: Diagnosis not present

## 2020-02-21 DIAGNOSIS — F3162 Bipolar disorder, current episode mixed, moderate: Secondary | ICD-10-CM | POA: Diagnosis not present

## 2020-02-21 DIAGNOSIS — F17200 Nicotine dependence, unspecified, uncomplicated: Secondary | ICD-10-CM

## 2020-02-21 MED ORDER — LAMOTRIGINE 100 MG PO TABS: 150.0000 mg | ORAL_TABLET | Freq: Every day | ORAL | 0 refills | Status: DC

## 2020-02-21 NOTE — Progress Notes (Signed)
Provider Location : [facility] Patient Location : Home  Virtual Visit via Video Note  I connected with Debra [name] on 02/21/20 at 10:20 AM EDT by a video enabled telemedicine application and verified that I am speaking with the correct person using two identifiers.   I discussed the limitations of evaluation and management by telemedicine and the availability of in person appointments. The patient expressed understanding and agreed to proceed.     I discussed the assessment and treatment plan with the patient. The patient was provided an opportunity to ask questions and all were answered. The patient agreed with the plan and demonstrated an understanding of the instructions.   The patient was advised to call back or seek an in-person evaluation if the symptoms worsen or if the condition fails to improve as anticipated.   [facility] MD OP Progress Note  02/21/2020 12:42 PM CHRYL HOLTEN  MRN:  [id number]  Chief Complaint:  Chief Complaint    Follow-up     HPI: Debra [name] is a 59 year old Caucasian female, married, on disability, lives in [place], has a history of bipolar disorder, PTSD, tobacco use disorder, parkinsonian tremors, chronic back pain, OSA, hypertension, possible REM sleep behavior disorder was evaluated by telemedicine today.  Patient today reports she is currently making progress with regards to her mood lability and anxiety.  She reports she is tolerating the higher dosage of Lamictal well.  She currently takes 150 mg daily.  Patient denies any suicidality, homicidality .  She does report seeing visions of a [employer] on and off, usually happens at night when she sits out in her porch, she however describes this as chronic, does not bother her.  Patient reports she had her disability review yesterday and she is currently anxious about the same.  Her mother is currently feeling much better and that does help.  She continues to follow-up with her therapist and reports therapy sessions as  going well.  She is currently trying to cut back on smoking cigarettes and reports she is making progress.  Visit Diagnosis:    ICD-10-CM   1. Bipolar 1 disorder, mixed, moderate (HCC)  F31.62    improving  2. PTSD (post-traumatic stress disorder)  F43.10 lamoTRIgine (LAMICTAL) 100 MG tablet  3. Tobacco use disorder  F17.200     Past Psychiatric History: 02/16/2019.  Past trials of Paxil, Depakote, Haldol, trazodone, melatonin  Past Medical History:  Past Medical History:  Diagnosis Date  . Anxiety   . Arthritis   . C. difficile colitis   . C. difficile colitis   . Depression   . Diabetes mellitus without complication ([place])    Type II  . Diabetes mellitus, type II ([place])   . Family history of adverse reaction to anesthesia    Mother - BP drops  . Head injury, closed, with brief LOC ([place]) 2011ish  . Hyperlipidemia   . Hypertension   . Insomnia   . Myalgia 03/29/2018   Due to statin  . Obesity   . Parkinson's disease ([place])   . Seasonal allergies   . Seizures ([place])   . Tobacco abuse     Past Surgical History:  Procedure Laterality Date  . ABDOMINAL HYSTERECTOMY  2012  . BREAST BIOPSY Right   . CESAREAN SECTION  1992  . CHOLECYSTECTOMY  2013  . LUMBAR LAMINECTOMY/DECOMPRESSION MICRODISCECTOMY Left 09/10/2015   Procedure: Left L4-5 Foraminotomy/Left L5-S1 Diskectomy;  Surgeon: Leeroy Cha, MD;  Location: [facility];  Service: Neurosurgery;  Laterality: Left;  Left L4-5 Foraminotomy/Left L5-S1 Diskectomy    Family Psychiatric History: I have reviewed family psychiatric history from my progress note on 02/16/2019.  Family History:  Family History  Problem Relation Age of Onset  . COPD Mother   . Bipolar disorder Father   . Diabetes Mellitus II Father   . Bipolar disorder Brother   . Diabetes Mellitus II Brother   . Bipolar disorder Brother     Social History: I have reviewed social history from my progress note on 02/16/2019 Social History   Socioeconomic  History  . Marital status: Married    Spouse name: [name]  . Number of children: 1  . Years of education: Not on file  . Highest education level: Associate degree: occupational, Hotel manager, or vocational program  Occupational History  . Occupation: Disabled  Tobacco Use  . Smoking status: Current Every Day Smoker    Packs/day: 0.50    Years: 24.00    Pack years: 12.00    Types: Cigarettes    Start date: 03/19/2004  . Smokeless tobacco: Never Used  Substance and Sexual Activity  . Alcohol use: Yes    Alcohol/week: 0.0 standard drinks    Comment: occasional- rare  . Drug use: No  . Sexual activity: Not Currently  Other Topics Concern  . Not on file  Social History Narrative   Live in private residence w/ husband and their friend who is wheelchair bound, her end of the house is wheelchair accessible.    Social Determinants of Health   Financial Resource Strain: Low Risk   . Difficulty of Paying Living Expenses: Not hard at all  Food Insecurity: Food Insecurity Present  . Worried About Charity fundraiser in the Last Year: Sometimes true  . Ran Out of Food in the Last Year: Never true  Transportation Needs:   . Lack of Transportation (Medical):   [name] Lack of Transportation (Non-Medical):   Physical Activity:   . Days of Exercise per Week:   . Minutes of Exercise per Session:   Stress: Stress Concern Present  . Feeling of Stress : To some extent  Social Connections: Unknown  . Frequency of Communication with Friends and Family: More than three times a week  . Frequency of Social Gatherings with Friends and Family: Not on file  . Attends Religious Services: Not on file  . Active Member of Clubs or Organizations: Not on file  . Attends Archivist Meetings: Not on file  . Marital Status: Not on file    Allergies:  Allergies  Allergen Reactions  . Clindamycin/Lincomycin     Pt states caused C-Diff  . Metformin And Related Diarrhea  . Benadryl [Diphenhydramine Hcl]  Swelling    blistering  . Canagliflozin Other (See Comments)    Other reaction(s): Other (See Comments) Low bp Low bp Low bp  . Depakote Er [Divalproex Sodium Er] Other (See Comments)    seizures  . Dilaudid [Hydromorphone Hcl] Other (See Comments)    seizures  . Diphenhydramine-Zinc Acetate Other (See Comments)  . Erythromycin   . Ethanol   . Haloperidol Other (See Comments)  . Hydromorphone Other (See Comments)  . Neosporin [Neomycin-Bacitracin Zn-Polymyx] Swelling    blistering  . Singulair [Montelukast Sodium] Other (See Comments)    Asthma attacks  . Sitagliptin Other (See Comments)  . Statins Other (See Comments)    Other reaction(s): Unknown  . Sulfa Antibiotics Hives    Other reaction(s): Unknown  . Victoza [Liraglutide] Other (See Comments)  and Nausea And Vomiting    Other reaction(s): Other (See Comments) pancreatitis pancreatitis    Metabolic Disorder Labs: Lab Results  Component Value Date   HGBA1C 8.3 (H) 09/21/2019   MPG 192 09/21/2019   MPG 214 06/16/2019   No results found for: PROLACTIN Lab Results  Component Value Date   CHOL 98 09/21/2019   TRIG 167 (H) 09/21/2019   HDL 49 (L) 09/21/2019   CHOLHDL 2.0 09/21/2019   LDLCALC 24 09/21/2019   LDLCALC 49 06/16/2019   Lab Results  Component Value Date   TSH 1.671 02/16/2019   TSH 2.160 08/20/2015    Therapeutic Level Labs: No results found for: LITHIUM No results found for: VALPROATE No components found for:  CBMZ  Current Medications: Current Outpatient Medications  Medication Sig Dispense Refill  . aspirin EC 81 MG tablet Take 81 mg by mouth daily.    . blood glucose meter kit and supplies Use up to four times daily as directed, Dx E11.65, LON 99 months 1 each 0  . Calcium Carbonate-Vitamin D 600-200 MG-UNIT TABS Take by mouth.    . Cholecalciferol 5000 units capsule Take 5,000 Units by mouth.     . clonazePAM (KLONOPIN) 0.5 MG tablet Take by mouth.    . clonazePAM (KLONOPIN) 0.5 MG  tablet Take 1 tablet (0.5 mg total) by mouth as directed. Take one tablet once a day as needed up to 2-3 times a week only for severe anxiety attacks 12 tablet 0  . clotrimazole-betamethasone (LOTRISONE) cream Apply 1 application topically 2 (two) times daily. Sparingly to affected skin for up to 7 d at a time 30 g 1  . doxycycline (VIBRAMYCIN) 100 MG capsule Take 1 capsule (100 mg total) by mouth 2 (two) times daily. 20 capsule 0  . Evolocumab (REPATHA SURECLICK) 700 MG/ML SOAJ Inject 140 mg into the skin every 14 (fourteen) days. 6 pen 2  . FARXIGA 10 MG TABS tablet TAKE 1 TABLET DAILY (HIGHER DOSE) 90 tablet 3  . fluconazole (DIFLUCAN) 150 MG tablet Take 1 tablet (150 mg total) by mouth every 3 (three) days as needed (for vaginal itching/yeast infection sx). 2 tablet 4  . folic acid (FOLVITE) 174 MCG tablet Take 800 mcg by mouth daily.     [name] glucose blood (ONE TOUCH ULTRA TEST) test strip USE UP TO FOUR TIMES A DAY AS DIRECTED; Dx 11.51, LON 99 months 300 each 3  . HUMALOG KWIKPEN 100 UNIT/ML KwikPen Give subcutaneously with meals, 20 units in the AM and 35 units with lunch and evening meal 27 pen 5  . hydrochlorothiazide (HYDRODIURIL) 25 MG tablet TAKE 1 TABLET DAILY (DOSE INCREASE, REPLACES ALL OTHER HCTZ PRESCRIPTIONS) 90 tablet 3  . HYDROcodone-acetaminophen (NORCO/VICODIN) 5-325 MG tablet TAKE 1 2 TABLETS BY MOUTH EVERY 6 HOURS AS NEEDED FOR PAIN    . Insulin Pen Needle 29G X 12.7MM MISC 1 each by Does not apply route as needed. 100 each 3  . lamoTRIgine (LAMICTAL) 100 MG tablet Take 1.5 tablets (150 mg total) by mouth daily. 135 tablet 0  . Lancets (ONETOUCH DELICA PLUS BSWHQP59F) MISC CHECK FINGER STICK BLOOD SUGARS FOUR TIMES A DAY 300 each 3  . lisinopril (PRINIVIL,ZESTRIL) 40 MG tablet Take 1 tablet (40 mg total) by mouth daily. 90 tablet 3  . Melatonin 10 MG TABS Take 1 tablet by mouth at bedtime.    . Multiple Vitamins-Minerals (CENTRUM WOMEN) TABS Take 1 tablet by mouth.    [name]  PARoxetine (PAXIL) 40  MG tablet TAKE 1 TABLET EVERY MORNING 90 tablet 1  . pioglitazone (ACTOS) 30 MG tablet TAKE 1 TABLET DAILY 90 tablet 0  . pregabalin (LYRICA) 25 MG capsule Take 1 capsule (25 mg total) by mouth 2 (two) times daily. 60 capsule 3  . propranolol (INNOPRAN XL) 80 MG 24 hr capsule TAKE 1 CAPSULE DAILY    . TRESIBA FLEXTOUCH 200 UNIT/ML SOPN Inject 120 Units into the skin at bedtime. (Patient taking differently: Inject 120 Units into the skin every morning. ) 36 pen 5   No current facility-administered medications for this visit.     Musculoskeletal: Strength & Muscle Tone: UTA Gait & Station: normal Patient leans: N/A  Psychiatric Specialty Exam: Review of Systems  Skin:       Skin abscess rt.sided upper thigh - painful  Psychiatric/Behavioral: Positive for hallucinations. The patient is nervous/anxious.   All other systems reviewed and are negative.   There were no vitals taken for this visit.There is no height or weight on file to calculate BMI.  General Appearance: Casual  Eye Contact:  Fair  Speech:  Clear and Coherent  Volume:  Normal  Mood:  Anxious  Affect:  Appropriate  Thought Process:  Goal Directed and Descriptions of Associations: Intact  Orientation:  Full (Time, Place, and Person)  Thought Content: Hallucinations: Visual   Suicidal Thoughts:  No  Homicidal Thoughts:  No  Memory:  Immediate;   Fair Recent;   Good Remote;   Fair  Judgement:  Intact  Insight:  Fair  Psychomotor Activity:  Normal  Concentration:  Concentration: Fair and Attention Span: Fair  Recall:  [employer] of Knowledge: Fair  Language: Fair  Akathisia:  No  Handed:  Right  AIMS (if indicated): UTA  Assets:  Communication Skills Desire for Improvement Housing Social Support  ADL's:  Intact  Cognition: WNL  Sleep:  Fair   Screenings: Mini-Mental     Office Visit from 09/21/2016 in [facility]  Total Score (max 30 points )  27    PHQ2-9      Office Visit from 02/16/2020 in [facility] Video Visit from 12/06/2019 in [facility] Office Visit from 09/21/2019 in [facility] Office Visit from 09/07/2019 in [facility] Office Visit from 06/13/2019 in [facility]  PHQ-2 Total Score  5  0  0  0  0  PHQ-9 Total Score  11  0  0  0  0       Assessment and Plan: Shea 58 year old Caucasian female, married, disabled, lives in [place], has a history of bipolar disorder, PTSD, hypertension, diabetes melitis, OSA on CPAP, chronic pain was evaluated by telemedicine today.  Patient is biologically predisposed given her history of trauma, family history of mental health problems.  She also has psychosocial stressors of the pandemic mother's health issues, her own health problems as well as financial issues and her disability currently undergoing review.  Patient is currently making progress and will continue to benefit from medication management and psychotherapy sessions.  Plan Bipolar disorder mixed-improving Lamotrigine 150 mg p.o. daily Paxil 40 mg p.o. daily Klonopin 0.5 mg p.o. as needed once a day up to 2-3 times a week for severe anxiety attacks only.  Patient reports she never used it till now and has been trying to avoid using it.  PTSD-stable Paxil as prescribed Continue CBT with Ms. Alden Hipp  Insomnia-stable Melatonin as prescribed Continue CPAP  for OSA  Tobacco use disorder-improving Provided smoking cessation counseling  Follow-up in clinic in 4 weeks or sooner if needed.  I have spent atleast 20 minutes non face to face with patient today. More than 50 % of the time was spent for preparing to see the patient ( e.g., review of test, records ), ordering medications and test ,psychoeducation and supportive psychotherapy and care coordination,as well as documenting clinical information in electronic health record. This note was  generated in part or whole with voice recognition software. Voice recognition is usually quite accurate but there are transcription errors that can and very often do occur. I apologize for any typographical errors that were not detected and corrected.       Ursula Alert, MD 02/21/2020, 12:42 PM

## 2020-02-23 ENCOUNTER — Encounter: Payer: Managed Care, Other (non HMO) | Attending: Physician Assistant | Admitting: Physician Assistant

## 2020-02-23 ENCOUNTER — Other Ambulatory Visit: Payer: Self-pay

## 2020-02-23 DIAGNOSIS — J449 Chronic obstructive pulmonary disease, unspecified: Secondary | ICD-10-CM | POA: Diagnosis not present

## 2020-02-23 DIAGNOSIS — I10 Essential (primary) hypertension: Secondary | ICD-10-CM | POA: Diagnosis not present

## 2020-02-23 DIAGNOSIS — G20 Parkinson's disease: Secondary | ICD-10-CM | POA: Diagnosis not present

## 2020-02-23 DIAGNOSIS — F319 Bipolar disorder, unspecified: Secondary | ICD-10-CM | POA: Insufficient documentation

## 2020-02-23 DIAGNOSIS — E1151 Type 2 diabetes mellitus with diabetic peripheral angiopathy without gangrene: Secondary | ICD-10-CM | POA: Diagnosis not present

## 2020-02-23 DIAGNOSIS — M1990 Unspecified osteoarthritis, unspecified site: Secondary | ICD-10-CM | POA: Diagnosis not present

## 2020-02-23 DIAGNOSIS — F17210 Nicotine dependence, cigarettes, uncomplicated: Secondary | ICD-10-CM | POA: Diagnosis not present

## 2020-02-23 DIAGNOSIS — E11622 Type 2 diabetes mellitus with other skin ulcer: Secondary | ICD-10-CM | POA: Diagnosis not present

## 2020-02-23 DIAGNOSIS — L98412 Non-pressure chronic ulcer of buttock with fat layer exposed: Secondary | ICD-10-CM | POA: Insufficient documentation

## 2020-02-23 DIAGNOSIS — L0231 Cutaneous abscess of buttock: Secondary | ICD-10-CM | POA: Diagnosis not present

## 2020-02-23 DIAGNOSIS — Z882 Allergy status to sulfonamides status: Secondary | ICD-10-CM | POA: Insufficient documentation

## 2020-02-23 DIAGNOSIS — Z885 Allergy status to narcotic agent status: Secondary | ICD-10-CM | POA: Diagnosis not present

## 2020-02-23 DIAGNOSIS — Z888 Allergy status to other drugs, medicaments and biological substances status: Secondary | ICD-10-CM | POA: Insufficient documentation

## 2020-02-23 NOTE — Progress Notes (Signed)
SHANYAH, GATTUSO ([id number]) Visit Report for 02/23/2020 Allergy List Details Patient Name: Debra [name], Debra [name]. Date of Service: 02/23/2020 1:15 PM Medical Record Number: [id number] Patient Account Number: [id number] Date of Birth/Sex: [birth year] (59 y.o. F) Treating RN: Huel Coventry Primary Care Ahmyah Gidley: Danelle Berry Other Clinician: Referring Makai Agostinelli: Referral, Self Treating Lashanti Chambless/Extender: STONE III, HOYT Weeks in Treatment: 0 Allergies Active Allergies Benadryl canagliflozin Depakote Dilaudid haloperidol hydromorphone Neosporin (neo-bac-polym) Singulair Statins-Hmg-Coa Reductase Inhibitors sitagliptin Sulfa (Sulfonamide Antibiotics) Victoza Allergy Notes Electronic Signature(s) Signed: 02/23/2020 5:21:44 PM By: Elliot Gurney, BSN, RN, CWS, Haelyn RN, BSN Entered By: Elliot Gurney, BSN, RN, CWS, Tracye on 02/23/2020 13:43:28 Debra [name] ([id number]) -------------------------------------------------------------------------------- Arrival Information Details Patient Name: Debra [name]. Date of Service: 02/23/2020 1:15 PM Medical Record Number: [id number] Patient Account Number: [id number] Date of Birth/Sex: 27-Sep-1961 (59 y.o. F) Treating RN: Huel Coventry Primary Care Ailis Rigaud: Danelle Berry Other Clinician: Referring Darcus Edds: Referral, Self Treating Debra [name]/Extender: Linwood Dibbles, HOYT Weeks in Treatment: 0 Visit Information Patient Arrived: Ambulatory Arrival Time: 13:39 Accompanied By: friend Transfer Assistance: None Patient Identification Verified: Yes Secondary Verification Process Completed: Yes History Since Last Visit Electronic Signature(s) Signed: 02/23/2020 5:21:44 PM By: Elliot Gurney, BSN, RN, CWS, Argusta RN, BSN Entered By: Elliot Gurney, BSN, RN, CWS, Jennalyn on 02/23/2020 13:40:25 Debra [name] ([id number]) -------------------------------------------------------------------------------- Clinic Level of Care Assessment Details Patient Name: Debra [name] Date of Service: 02/23/2020 1:15 PM Medical Record  Number: [id number] Patient Account Number: [id number] Date of Birth/Sex: 1961-11-20 (59 y.o. F) Treating RN: Huel Coventry Primary Care Lanore Renderos: Danelle Berry Other Clinician: Referring Crystina Borrayo: Referral, Self Treating Debra [name]/Extender: Linwood Dibbles, HOYT Weeks in Treatment: 0 Clinic Level of Care Assessment Items TOOL 1 Quantity Score []  - Use when EandM and Procedure is performed on INITIAL visit 0 ASSESSMENTS - Nursing Assessment / Reassessment X - General Physical Exam (combine w/ comprehensive assessment (listed just below) when performed on new pt. 1 20 evals) X- 1 25 Comprehensive Assessment (HX, ROS, Risk Assessments, Wounds Hx, etc.) ASSESSMENTS - Wound and Skin Assessment / Reassessment []  - Dermatologic / Skin Assessment (not related to wound area) 0 ASSESSMENTS - Ostomy and/or Continence Assessment and Care []  - Incontinence Assessment and Management 0 []  - 0 Ostomy Care Assessment and Management (repouching, etc.) PROCESS - Coordination of Care X - Simple Patient / Family Education for ongoing care 1 15 []  - 0 Complex (extensive) Patient / Family Education for ongoing care X- 1 10 Staff obtains , Records, Test Results / Process Orders []  - 0 Staff telephones HHA, Nursing Homes / Clarify orders / etc []  - 0 Routine Transfer to another Facility (non-emergent condition) []  - 0 Routine Hospital Admission (non-emergent condition) X- 1 15 New Admissions / / Ordering NPWT, Apligraf, etc. []  - 0 Emergency Hospital Admission (emergent condition) PROCESS - Special Needs []  - Pediatric / Minor Patient Management 0 []  - 0 Isolation Patient Management []  - 0 Hearing / Language / Visual special needs []  - 0 Assessment of Community assistance (transportation, D/C planning, etc.) []  - 0 Additional assistance / Altered mentation []  - 0 Support Surface(s) Assessment (bed, cushion, seat, etc.) INTERVENTIONS - Miscellaneous []  - External ear  exam 0 []  - 0 Patient Transfer (multiple staff / / Similar devices) []  - 0 Simple Staple / Suture removal (25 or less) []  - 0 Complex Staple / Suture removal (26 or more) []  - 0 Hypo/Hyperglycemic Management (do not check if billed separately) , Debra A. ( ) []  - 0 Ankle / Brachial  Index (ABI) - do not check if billed separately Has the patient been seen at the hospital within the last three years: Yes Total Score: 85 Level Of Care: New/Established - Level 3 Electronic Signature(s) Signed: 02/23/2020 5:21:44 PM By: Elliot Gurney, BSN, RN, CWS, Milady RN, BSN Entered By: Elliot Gurney, BSN, RN, CWS, Livianna on 02/23/2020 14:13:25 Debra [name] ([id number]) -------------------------------------------------------------------------------- Encounter Discharge Information Details Patient Name: Debra [name]. Date of Service: 02/23/2020 1:15 PM Medical Record Number: [id number] Patient Account Number: [id number] Date of Birth/Sex: February 18, 1961 (59 y.o. F) Treating RN: Huel Coventry Primary Care Velecia Ovitt: Danelle Berry Other Clinician: Referring Eran Mistry: Referral, Self Treating Debra [name]/Extender: Linwood Dibbles, HOYT Weeks in Treatment: 0 Encounter Discharge Information Items Post Procedure Vitals Discharge Condition: Stable Unable to obtain vitals Reason: . Ambulatory Status: Ambulatory Discharge Destination: Home Transportation: Private Auto Accompanied By: self Schedule Follow-up Appointment: Yes Clinical Summary of Care: Electronic Signature(s) Signed: 02/23/2020 5:21:44 PM By: Elliot Gurney, BSN, RN, CWS, Dezhane RN, BSN Entered By: Elliot Gurney, BSN, RN, CWS, Maybelline on 02/23/2020 14:14:47 Debra [name] ([id number]) -------------------------------------------------------------------------------- Lower Extremity Assessment Details Patient Name: Debra [name] Date of Service: 02/23/2020 1:15 PM Medical Record Number: [id number] Patient Account Number: [id number] Date of Birth/Sex: [birth year] (59 y.o. F) Treating RN:  Huel Coventry Primary Care Barby Colvard: Danelle Berry Other Clinician: Referring Journey Castonguay: Referral, Self Treating Cace Osorto/Extender: Linwood Dibbles, HOYT Weeks in Treatment: 0 Electronic Signature(s) Signed: 02/23/2020 5:21:44 PM By: Elliot Gurney, BSN, RN, CWS, Blimi RN, BSN Entered By: Elliot Gurney, BSN, RN, CWS, Bryleigh on 02/23/2020 13:43:06 Debra [name] ([id number]) -------------------------------------------------------------------------------- Multi Wound Chart Details Patient Name: Debra [name] Date of Service: 02/23/2020 1:15 PM Medical Record Number: [id number] Patient Account Number: [id number] Date of Birth/Sex: October 08, 1961 (59 y.o. F) Treating RN: Huel Coventry Primary Care Chawn Spraggins: Danelle Berry Other Clinician: Referring Esperanza Madrazo: Referral, Self Treating Breshae Belcher/Extender: STONE III, HOYT Weeks in Treatment: 0 Vital Signs Height(in): 65 Pulse(bpm): 64 Weight(lbs): 293 Blood Pressure(mmHg): 122/64 Body Mass Index(BMI): 49 Temperature(F): 98.9 Respiratory Rate(breaths/min): 18 Photos: [N/A:N/A] Wound Location: Right Gluteal fold N/A N/A Wounding Event: Gradually Appeared N/A N/A Primary Etiology: Abscess N/A N/A Comorbid History: Hypertension, Type II Diabetes, N/A N/A Osteoarthritis Date Acquired: 02/16/2020 N/A N/A Weeks of Treatment: 0 N/A N/A Wound Status: Open N/A N/A Measurements L x W x D (cm) 3x1x1.2 N/A N/A Area (cm) : 2.356 N/A N/A Volume (cm) : 2.827 N/A N/A % Reduction in Area: 0.00% N/A N/A % Reduction in Volume: 0.00% N/A N/A Classification: Full Thickness Without Exposed N/A N/A Support Structures Exudate Amount: Medium N/A N/A Exudate Type: Serous N/A N/A Exudate Color: amber N/A N/A Wound Margin: Well defined, not attached N/A N/A Granulation Amount: None Present (0%) N/A N/A Necrotic Amount: Large (67-100%) N/A N/A Exposed Structures: Fat Layer (Subcutaneous Tissue) N/A N/A Exposed: Yes Fascia: No Tendon: No Muscle: No Joint: No Bone: No Epithelialization: None N/A  N/A Treatment Notes Electronic Signature(s) Signed: 02/23/2020 5:21:44 PM By: Elliot Gurney, BSN, RN, CWS, Maelynn RN, BSN Entered By: Elliot Gurney, BSN, RN, CWS, Tywana on 02/23/2020 14:10:15 Debra [name] ([id number] [name] ([id number]) -------------------------------------------------------------------------------- Multi-Disciplinary Care Plan Details Patient Name: Debra [name]. Date of Service: 02/23/2020 1:15 PM Medical Record Number: [id number] Patient Account Number: [id number] Date of Birth/Sex: October 25, 1961 (59 y.o. F) Treating RN: Huel Coventry Primary Care Gee Habig: Danelle Berry Other Clinician: Referring Arya Luttrull: Referral, Self Treating Debra [name]/Extender: Linwood Dibbles, HOYT Weeks in Treatment: 0 Active Inactive Necrotic Tissue Nursing Diagnoses: Impaired tissue integrity related to necrotic/devitalized tissue Goals: Necrotic/devitalized tissue will be minimized in the  wound bed Date Initiated: 02/23/2020 Target Resolution Date: 03/06/2020 Goal Status: Active Interventions: Assess patient pain level pre-, during and post procedure and prior to discharge Treatment Activities: Excisional debridement : 02/23/2020 Notes: Orientation to the Wound Care Program Nursing Diagnoses: Knowledge deficit related to the wound healing center program Goals: Patient/caregiver will verbalize understanding of the [facility] Date Initiated: 02/23/2020 Target Resolution Date: 03/06/2020 Goal Status: Active Interventions: Provide education on orientation to the wound center Notes: Soft Tissue Infection Nursing Diagnoses: Impaired tissue integrity Knowledge deficit related to home infection control: handwashing, handling of soiled dressings, supply storage Goals: Patient will remain free of wound infection Date Initiated: 02/23/2020 Target Resolution Date: 03/06/2020 Goal Status: Active Interventions: Assess signs and symptoms of infection every visit Notes: Wound/Skin Impairment Debra [name], Debra [name] ([id number]) Nursing Diagnoses: Impaired tissue integrity Goals: Ulcer/skin breakdown will have a volume reduction of 30% by week 4 Date Initiated: 02/23/2020 Target Resolution Date: 03/25/2020 Goal Status: Active Interventions: Assess patient/caregiver ability to obtain necessary supplies Treatment Activities: Skin care regimen initiated : 02/23/2020 Topical wound management initiated : 02/23/2020 Notes: Electronic Signature(s) Signed: 02/23/2020 5:21:44 PM By: Gretta Cool, BSN, RN, CWS, Julizza RN, BSN Entered By: Gretta Cool, BSN, RN, CWS, Johnasia on 02/23/2020 14:09:57 Debra [name] ([id number]) -------------------------------------------------------------------------------- Pain Assessment Details Patient Name: Debra [name]. Date of Service: 02/23/2020 1:15 PM Medical Record Number: [id number] Patient Account Number: [id number] Date of Birth/Sex: 1961/09/06 (59 y.o. F) Treating RN: Cornell Barman Primary Care Annelle Behrendt: Delsa Grana Other Clinician: Referring Dai Apel: Referral, Self Treating Vivia Rosenburg/Extender: Melburn Hake, HOYT Weeks in Treatment: 0 Active Problems Location of Pain Severity and Description of Pain Patient Has Paino Yes Site Locations Pain Location: Pain in Ulcers Pain Management and Medication Current Pain Management: Notes not as much as it has been Electronic Signature(s) Signed: 02/23/2020 5:21:44 PM By: Gretta Cool, BSN, RN, CWS, Homer RN, BSN Entered By: Gretta Cool, BSN, RN, CWS, Faustine on 02/23/2020 13:40:53 Debra [name] ([id number]) -------------------------------------------------------------------------------- Patient/Caregiver Education Details Patient Name: Debra [name]. Date of Service: 02/23/2020 1:15 PM Medical Record Number: [id number] Patient Account Number: [id number] Date of Birth/Gender: 1961-09-19 (59 y.o. F) Treating RN: Cornell Barman Primary Care Physician: Delsa Grana Other Clinician: Referring Physician: Referral, Self Treating Physician/Extender: Sharalyn Ink in  Treatment: 0 Education Assessment Education Provided To: Patient Education Topics Provided Wound/Skin Impairment: Handouts: Caring for Your Ulcer Methods: Demonstration, Explain/Verbal Responses: State content correctly Electronic Signature(s) Signed: 02/23/2020 5:21:44 PM By: Gretta Cool, BSN, RN, CWS, Odilia RN, BSN Entered By: Gretta Cool, BSN, RN, CWS, Genavive on 02/23/2020 14:13:40 Debra [name] ([id number]) -------------------------------------------------------------------------------- Wound Assessment Details Patient Name: Debra [name] Date of Service: 02/23/2020 1:15 PM Medical Record Number: [id number] Patient Account Number: [id number] Date of Birth/Sex: 05-16-1961 (59 y.o. F) Treating RN: Cornell Barman Primary Care Tylin Force: Delsa Grana Other Clinician: Referring Sherese Heyward: Referral, Self Treating Ammon Muscatello/Extender: STONE III, HOYT Weeks in Treatment: 0 Wound Status Wound Number: 4 Primary Etiology: Abscess Wound Location: Right Gluteal fold Wound Status: Open Wounding Event: Gradually Appeared Comorbid History: Hypertension, Type II Diabetes, Osteoarthritis Date Acquired: 02/16/2020 Weeks Of Treatment: 0 Clustered Wound: No Photos Wound Measurements Length: (cm) 3 Width: (cm) 1 Depth: (cm) 1.2 Area: (cm) 2.356 Volume: (cm) 2.827 % Reduction in Area: 0% % Reduction in Volume: 0% Epithelialization: None Wound Description Classification: Full Thickness Without Exposed Support Structu Wound Margin: Well defined, not attached Exudate Amount: Medium Exudate Type: Serous Exudate Color: amber res Foul Odor After Cleansing: No Slough/Fibrino Yes Wound Bed Granulation Amount: None Present (  0%) Exposed Structure Necrotic Amount: Large (67-100%) Fascia Exposed: No Necrotic Quality: Adherent Slough Fat Layer (Subcutaneous Tissue) Exposed: Yes Tendon Exposed: No Muscle Exposed: No Joint Exposed: No Bone Exposed: No Treatment Notes Wound #4 (Right Gluteal fold) Notes silver cell  packed into wound, brdered foam dressing Electronic Signature(s) Signed: 02/23/2020 5:21:44 PM By: Elliot Gurney, BSN, RN, CWS, Macenzie RN, BSN Debra [name] ([id number]) Entered By: Elliot Gurney, BSN, RN, CWS, Stephane on 02/23/2020 13:55:55 Debra [name] ([id number]) -------------------------------------------------------------------------------- Vitals Details Patient Name: Debra [name]. Date of Service: 02/23/2020 1:15 PM Medical Record Number: [id number] Patient Account Number: [id number] Date of Birth/Sex: 10/30/1961 (59 y.o. F) Treating RN: Huel Coventry Primary Care Darilyn Storbeck: Danelle Berry Other Clinician: Referring Chelli Yerkes: Referral, Self Treating Rafan Sanders/Extender: STONE III, HOYT Weeks in Treatment: 0 Vital Signs Time Taken: 13:40 Temperature (F): 98.9 Height (in): 65 Pulse (bpm): 64 Weight (lbs): 293 Respiratory Rate (breaths/min): 18 Body Mass Index (BMI): 48.8 Blood Pressure (mmHg): 122/64 Reference Range: 80 - 120 mg / dl Electronic Signature(s) Signed: 02/23/2020 5:21:44 PM By: Elliot Gurney, BSN, RN, CWS, Annabel RN, BSN Entered By: Elliot Gurney, BSN, RN, CWS, Masayo on 02/23/2020 13:42:12

## 2020-02-23 NOTE — Progress Notes (Signed)
KIDA, DIGIULIO ([id number]) Visit Report for 02/23/2020 Abuse/Suicide Risk Screen Details Patient Name: Debra [name], Debra [name]. Date of Service: 02/23/2020 1:15 PM Medical Record Number: [id number] Patient Account Number: [id number] Date of Birth/Sex: Jun 26, 1961 (59 y.o. F) Treating RN: Huel Coventry Primary Care Derreck Wiltsey: Danelle Berry Other Clinician: Referring Devan Babino: Referral, Self Treating Jonny Longino/Extender: STONE III, HOYT Weeks in Treatment: 0 Abuse/Suicide Risk Screen Items Answer ABUSE RISK SCREEN: Has anyone close to you tried to hurt or harm you recentlyo No Do you feel uncomfortable with anyone in your familyo No Has anyone forced you do things that you didnot want to doo No Electronic Signature(s) Signed: 02/23/2020 5:21:44 PM By: Elliot Gurney, BSN, RN, CWS, Beatryce RN, BSN Entered By: Elliot Gurney, BSN, RN, CWS, Becka on 02/23/2020 13:45:12 Debra [name] ([id number]) -------------------------------------------------------------------------------- Activities of Daily Living Details Patient Name: Debra [name] Date of Service: 02/23/2020 1:15 PM Medical Record Number: [id number] Patient Account Number: [id number] Date of Birth/Sex: [birth year] (59 y.o. F) Treating RN: Huel Coventry Primary Care Bekki Tavenner: Danelle Berry Other Clinician: Referring Rayce Brahmbhatt: Referral, Self Treating Jarred Purtee/Extender: STONE III, HOYT Weeks in Treatment: 0 Activities of Daily Living Items Answer Activities of Daily Living (Please select one for each item) Drive Automobile Completely Able Take Medications Completely Able Use Telephone Completely Able Care for Appearance Completely Able Use Toilet Completely Able Bath / Shower Completely Able Dress Self Completely Able Feed Self Completely Able Walk Completely Able Get In / Out Bed Completely Able Housework Completely Able Prepare Meals Completely Able Handle Money Completely Able Shop for Self Completely Able Electronic Signature(s) Signed: 02/23/2020 5:21:44 PM By: Elliot Gurney, BSN,  RN, CWS, Malicia RN, BSN Entered By: Elliot Gurney, BSN, RN, CWS, Anamaria on 02/23/2020 13:45:23 Debra [name] ([id number]) -------------------------------------------------------------------------------- Education Screening Details Patient Name: Debra [name] Date of Service: 02/23/2020 1:15 PM Medical Record Number: [id number] Patient Account Number: [id number] Date of Birth/Sex: 05/10/1961 (59 y.o. F) Treating RN: Huel Coventry Primary Care Ely Ballen: Danelle Berry Other Clinician: Referring Azra Abrell: Referral, Self Treating Hazen Brumett/Extender: Linwood Dibbles, HOYT Weeks in Treatment: 0 Primary Learner Assessed: Patient Learning Preferences/Education Level/Primary Language Learning Preference: Demonstration Highest Education Level: College or Above Preferred Language: English Cognitive Barrier Language Barrier: No Translator Needed: No Memory Deficit: No Emotional Barrier: No Cultural/Religious Beliefs Affecting Medical Care: No Physical Barrier Impaired Vision: Yes Glasses Impaired Hearing: No Decreased Hand dexterity: No Knowledge/Comprehension Knowledge Level: High Comprehension Level: High Ability to understand written instructions: High Ability to understand verbal instructions: High Motivation Anxiety Level: Calm Cooperation: Cooperative Education Importance: Acknowledges Need Interest in Health Problems: Asks Questions Perception: Coherent Willingness to Engage in Self-Management High Activities: Readiness to Engage in Self-Management High Activities: Electronic Signature(s) Signed: 02/23/2020 5:21:44 PM By: Elliot Gurney, BSN, RN, CWS, Tauni RN, BSN Entered By: Elliot Gurney, BSN, RN, CWS, Janashia on 02/23/2020 13:46:01 Debra [name] ([id number]) -------------------------------------------------------------------------------- Fall Risk Assessment Details Patient Name: Debra [name] Date of Service: 02/23/2020 1:15 PM Medical Record Number: [id number] Patient Account Number: [id number] Date of Birth/Sex:  1961-08-07 (59 y.o. F) Treating RN: Huel Coventry Primary Care Saxon Barich: Danelle Berry Other Clinician: Referring Charman Blasco: Referral, Self Treating Naketa Daddario/Extender: Linwood Dibbles, HOYT Weeks in Treatment: 0 Fall Risk Assessment Items Have you had 2 or more falls in the last 12 monthso 0 No Have you had any fall that resulted in injury in the last 12 monthso 0 No FALLS RISK SCREEN History of falling - immediate or within 3 months 0 No Secondary diagnosis (Do you have 2 or more medical diagnoseso) 0 No Ambulatory aid None/bed  rest/wheelchair/nurse 0 Yes Crutches/cane/walker 0 No Furniture 0 No Intravenous therapy Access/Saline/Heparin Lock 0 No Gait/Transferring Normal/ bed rest/ wheelchair 0 Yes Weak (short steps with or without shuffle, stooped but able to lift head while walking, may seek 0 No support from furniture) Impaired (short steps with shuffle, may have difficulty arising from chair, head down, impaired 0 No balance) Mental Status Oriented to own ability 0 Yes Electronic Signature(s) Signed: 02/23/2020 5:21:44 PM By: Gretta Cool, BSN, RN, CWS, Jiah RN, BSN Entered By: Gretta Cool, BSN, RN, CWS, Petra on 02/23/2020 13:46:14 Debra [name] ([id number]) -------------------------------------------------------------------------------- Foot Assessment Details Patient Name: Debra [name]. Date of Service: 02/23/2020 1:15 PM Medical Record Number: [id number] Patient Account Number: [id number] Date of Birth/Sex: [birth year] (59 y.o. F) Treating RN: Cornell Barman Primary Care Kristiann Noyce: Delsa Grana Other Clinician: Referring Keiandre Cygan: Referral, Self Treating Mailynn Everly/Extender: STONE III, HOYT Weeks in Treatment: 0 Foot Assessment Items Site Locations + = Sensation present, - = Sensation absent, C = Callus, U = Ulcer R = Redness, W = Warmth, M = Maceration, PU = Pre-ulcerative lesion F = Fissure, S = Swelling, D = Dryness Assessment Right: Left: Other Deformity: No No Prior Foot Ulcer: No No Prior  Amputation: No No Charcot Joint: No No Ambulatory Status: Ambulatory Without Help Gait: Steady Electronic Signature(s) Signed: 02/23/2020 5:21:44 PM By: Gretta Cool, BSN, RN, CWS, Clorine RN, BSN Entered By: Gretta Cool, BSN, RN, CWS, Harleen on 02/23/2020 13:46:41 Debra [name] ([id number]) -------------------------------------------------------------------------------- Nutrition Risk Screening Details Patient Name: Debra [name] Date of Service: 02/23/2020 1:15 PM Medical Record Number: [id number] Patient Account Number: [id number] Date of Birth/Sex: [birth year] (59 y.o. F) Treating RN: Cornell Barman Primary Care Jameon Deller: Delsa Grana Other Clinician: Referring Gladys Deckard: Referral, Self Treating Kashmir Leedy/Extender: STONE III, HOYT Weeks in Treatment: 0 Height (in): 65 Weight (lbs): 293 Body Mass Index (BMI): 48.8 Nutrition Risk Screening Items Score Screening NUTRITION RISK SCREEN: I have an illness or condition that made me change the kind and/or amount of food I eat 0 No I eat fewer than two meals per day 0 No I eat few fruits and vegetables, or milk products 0 No I have three or more drinks of beer, liquor or wine almost every day 0 No I have tooth or mouth problems that make it hard for me to eat 0 No I don't always have enough money to buy the food I need 0 No I eat alone most of the time 0 No I take three or more different prescribed or over-the-counter drugs a day 1 Yes Without wanting to, I have lost or gained 10 pounds in the last six months 0 No I am not always physically able to shop, cook and/or feed myself 0 No Nutrition Protocols Good Risk Protocol 0 No interventions needed Moderate Risk Protocol High Risk Proctocol Risk Level: Good Risk Score: 1 Electronic Signature(s) Signed: 02/23/2020 5:21:44 PM By: Gretta Cool, BSN, RN, CWS, Rori RN, BSN Entered By: Gretta Cool, BSN, RN, CWS, Yuliana on 02/23/2020 13:46:32

## 2020-02-23 NOTE — Progress Notes (Signed)
Debra BowieKANE, Chyane A. ([id number]) Visit Report for 02/23/2020 Chief Complaint Document Details Patient Name: Debra BowieKANE, Debra A. Date of Service: 02/23/2020 1:15 PM Medical Record Number: [id number] Patient Account Number: [id number] Date of Birth/Sex: January 31, 1961 (59 y.o. F) Treating RN: Curtis Sitesorthy, Joanna Primary Care Provider: Danelle BerryAPIA, LEISA Other Clinician: Referring Provider: Referral, Self Treating Provider/Extender: Linwood DibblesSTONE III, Shaleen Talamantez Weeks in Treatment: 0 Information Obtained from: Patient Chief Complaint Right gluteal fold abscess Electronic Signature(s) Signed: 02/23/2020 2:01:51 PM By: Lenda KelpStone III, Atleigh Gruen PA-C Entered By: Lenda KelpStone III, Curlee Bogan on 02/23/2020 14:01:50 Debra BowieKANE, Royalti A. ([id number]) -------------------------------------------------------------------------------- Debridement Details Patient Name: Debra BowieKANE, Debra A. Date of Service: 02/23/2020 1:15 PM Medical Record Number: [id number] Patient Account Number: [id number] Date of Birth/Sex: January 31, 1961 (59 y.o. F) Treating RN: Huel CoventryWoody, Sarahbeth Primary Care Provider: Danelle BerryAPIA, LEISA Other Clinician: Referring Provider: Referral, Self Treating Provider/Extender: STONE III, Josphine Laffey Weeks in Treatment: 0 Debridement Performed for Wound #4 Right Gluteal fold Assessment: Performed By: Physician STONE III, Kristalynn Coddington E., PA-C Debridement Type: Debridement Level of Consciousness (Pre- Awake and Alert procedure): Pre-procedure Verification/Time Out Yes - 14:05 Taken: Start Time: 14:05 Pain Control: Lidocaine Total Area Debrided (L x W): 3 (cm) x 1 (cm) = 3 (cm) Tissue and other material debrided: Viable, Eschar, Slough, Slough Level: Non-Viable Tissue Debridement Description: Selective/Open Wound Instrument: Forceps, Scissors Bleeding: Moderate Hemostasis Achieved: Pressure End Time: 14:09 Response to Treatment: Procedure was tolerated well Level of Consciousness (Post- Awake and Alert procedure): Post Debridement Measurements of Total Wound Length: (cm) 3 Width: (cm)  1 Depth: (cm) 1.2 Volume: (cm) 2.827 Character of Wound/Ulcer Post Debridement: Stable Post Procedure Diagnosis Same as Pre-procedure Electronic Signature(s) Signed: 02/23/2020 4:37:51 PM By: Lenda KelpStone III, Adrean Findlay PA-C Signed: 02/23/2020 5:21:44 PM By: Elliot GurneyWoody, BSN, RN, CWS, Lorianne RN, BSN Entered By: Elliot GurneyWoody, BSN, RN, CWS, Daejah on 02/23/2020 14:11:28 Debra BowieKANE, Mikia A. ([id number]) -------------------------------------------------------------------------------- HPI Details Patient Name: Debra BowieKANE, Debra A. Date of Service: 02/23/2020 1:15 PM Medical Record Number: [id number] Patient Account Number: [id number] Date of Birth/Sex: January 31, 1961 (59 y.o. 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 She tells me everything was fine up until a week ago. She noticed pain in her left groin area. By Saturday this it started to drain. She applied black salve to this area which she has done in the past and is helped heal problems in this area. This did not work. She was seen in the ER on 03/27/18 with an abscess in the left groin. Noted to have surrounding cellulitis and an indurated area. She underwent an IandD. She was started on patient clindamycin. Her white count was 11.9. Comprehensive metabolic panel normal lactic acid level normal at 1.2. They did not do a culture that I can see. She was seen yesterday at her primary physician's office she may have been put on cephalexin at that point. She is using iodoform packing The patient is a type II diabetic with a history of PAD last hemoglobin A1c of 7.3. She has a history of parkinsonism, COPD, bipolar disorder hypertension. She is a continued 1 pack per day smoker She tells me that she  does have a history of recurrent abscesses in this area last about a year ago. Mostly she seems to care for these herself and they seem to go away. 04/06/18; patient's  surgical IandD on the lower left abdominal quadrant just above the symphysis pubis appears better. Following is better we've been using silver alginate. Culture I did last week did not grow MRSA rather Enterococcus faecalis. I substituted Augmentin for the clindamycin however the patient didn't have enough money, however she will pick it up this morning 04/13/18; surgical IandD on the lower left abdominal quadrant just above the symphysis pubis. Surface of this area looks healthy. This is an oval-shaped wound lying horizontally. Medially it has A, all with about 0.9 cm in depth there is no drainage no surrounding erythema. She is completing the Augmentin I gave her last week 04/27/18; surgical IandD site on the lower left abdominal quadrant just above the symphysis pubis. Surface of this wound is improved dimensions are better. We've been using silver alginate and border foam. She tells me she developed C. difficile has been treated with oral vancomycin. Will need to be very judicious with any further antibiotics 05/04/18; surgical IandD site on the lower left abdomen just above the symphysis pubis. All of this is closed over except a small slit like area which is probably where the sinus tract was. There is no drainage no tenderness. 05/11/18; surgical IandD site on the left lower abdomen just above the symphysis pubis. All of this is closed. There is no open area here. No drainage and no tenderness she does have a history of abscesses but apparently none that have had difficulty healing like this one READMISSION 11/09/18 This is a patient we know from previous stays in this clinic. She apparently has a history of recurrent Cutaneous abscesses. We had her in the clinic last time with a lower abdomen/inguinal area abscess that required a  surgical IandD. We managed to get this to close over. She also lives in fear of recurrent C. difficile and does not want systemic antibiotics. She tells [place] that she developed a small abscess in the wound area about 2-3 weeks ago. This spontaneously ruptured and had purulent material maintained. She is here for our review of the resultant wound 11/23/2018; interestingly the area of the opened for which we saw her 2 weeks ago has closed. This was likely an abscess given the clinical description although we did not really see it at the beginning. Over the timeframe since we have seen here she developed a second area just underneath the original one from last week. As her friend describes this it is a purple papule that is painful, develops purulence ruptures and then heals. Right now it is a flat open area. 12/14/2018; the patient has no open wound. By description these are said to be abscess these perform rupture and then formal wound. This is a second time we have had her for this. Readmission: 02/23/2020 patient presents for readmission in the clinic here today concerning issues that she has been having with a right gluteal fold abscess. She was actually seen in urgent care where they actually did perform an incision and drainage. They also did place her on antibiotics as well. She is currently on doxycycline. With that being said the good news is this does not appear to be significantly infected although there is necrotic tissue I think we need to work on getting this cleaned out but nonetheless I do not see any signs of active local or systemic infection. She does have a history of diabetes mellitus type 2. Electronic Signature(s) Signed: 02/23/2020 2:52:08 PM By: Lenda Kelp PA-C Entered By: Lenda Kelp on 02/23/2020  14:52:07 Debra [name] ([id number]) -------------------------------------------------------------------------------- Physical Exam Details Patient Name: Debra [name], HOEPPNER. Date of  Service: 02/23/2020 1:15 PM Medical Record Number: [id number] Patient Account Number: [id number] Date of Birth/Sex: Mar 31, 1961 (59 y.o. F) Treating RN: Curtis Sites Primary Care Provider: Danelle Berry Other Clinician: Referring Provider: Referral, Self Treating Provider/Extender: STONE III, Hailynn Slovacek Weeks in Treatment: 0 Constitutional sitting or standing blood pressure is within target range for patient.. pulse regular and within target range for patient.[name] respirations regular, non-labored and within target range for patient.[name] temperature within target range for patient.. Well-nourished and well-hydrated in no acute distress. Eyes conjunctiva clear no eyelid edema noted. pupils equal round and reactive to light and accommodation. Ears, Nose, Mouth, and Throat no gross abnormality of ear auricles or external auditory canals. normal hearing noted during conversation. mucus membranes moist. Respiratory normal breathing without difficulty. Cardiovascular regular rate and rhythm with normal S1, S2. Musculoskeletal normal gait and posture. no significant deformity or arthritic changes, no loss or range of motion, no clubbing. Psychiatric this patient is able to make decisions and demonstrates good insight into disease process. Alert and Oriented x 3. pleasant and cooperative. Notes Upon inspection patient's wound bed again showed some signs of necrotic tissue currently which did require sharp debridement at this point. I was able to use scissors and forceps to remove the majority of necrotic tissue from the surface and edges of the wound which the patient tolerated for the most part without any pain when she did have discomfort this was short-lived and towards the end of the procedure. The good news is post debridement I was able to remove the majority of the necrotic debris and again she seems to be doing quite well which is great news. No fevers, chills, nausea, vomiting, or  diarrhea. Electronic Signature(s) Signed: 02/23/2020 2:55:04 PM By: Lenda Kelp PA-C Entered By: Lenda Kelp on 02/23/2020 14:55:04 Debra [name] ([id number]) -------------------------------------------------------------------------------- Physician Orders Details Patient Name: Debra [name] Date of Service: 02/23/2020 1:15 PM Medical Record Number: [id number] Patient Account Number: [id number] Date of Birth/Sex: Dec 14, 1960 (59 y.o. F) Treating RN: Huel Coventry Primary Care Provider: Danelle Berry Other Clinician: Referring Provider: Referral, Self Treating Provider/Extender: Linwood Dibbles, Zelig Gacek Weeks in Treatment: 0 Verbal / Phone Orders: No Diagnosis Coding ICD-10 Coding Code Description L02.31 Cutaneous abscess of buttock E11.622 Type 2 diabetes mellitus with other skin ulcer L98.412 Non-pressure chronic ulcer of buttock with fat layer exposed Wound Cleansing Wound #4 Right Gluteal fold o Cleanse wound with mild soap and water Anesthetic (add to Medication List) Wound #4 Right Gluteal fold o Topical Lidocaine 4% cream applied to wound bed prior to debridement (In Clinic Only). Primary Wound Dressing Wound #4 Right Gluteal fold o Silver Alginate Secondary Dressing Wound #4 Right Gluteal fold o Boardered Foam Dressing Dressing Change Frequency Wound #4 Right Gluteal fold o Change dressing every day. Follow-up Appointments Wound #4 Right Gluteal fold o Return Appointment in 1 week. Off-Loading Wound #4 Right Gluteal fold o Turn and reposition every 2 hours Additional Orders / Instructions Wound #4 Right Gluteal fold o Increase protein intake. Electronic Signature(s) Signed: 02/23/2020 4:37:51 PM By: Lenda Kelp PA-C Signed: 02/23/2020 5:21:44 PM By: Elliot Gurney, BSN, RN, CWS, Renalda RN, BSN Entered By: Elliot Gurney, BSN, RN, CWS, Mimie on 02/23/2020 14:12:55 Debra [name]  ([id number]) -------------------------------------------------------------------------------- Problem List Details Patient Name: Debra [name] Date of Service: 02/23/2020 1:15 PM Medical Record Number: [id number] Patient Account Number: [id number] Date of Birth/Sex: Oct 24, 1961 (59 y.o. F)  Treating RN: Montey Hora Primary Care Provider: Delsa Grana Other Clinician: Referring Provider: Referral, Self Treating Provider/Extender: Melburn Hake, Jaidon Sponsel Weeks in Treatment: 0 Active Problems ICD-10 Evaluated Encounter Code Description Active Date Today Diagnosis L02.31 Cutaneous abscess of buttock 02/23/2020 No Yes E11.622 Type 2 diabetes mellitus with other skin ulcer 02/23/2020 No Yes L98.412 Non-pressure chronic ulcer of buttock with fat layer exposed 02/23/2020 No Yes Inactive Problems Resolved Problems Electronic Signature(s) Signed: 02/23/2020 2:01:14 PM By: Worthy Keeler PA-C Entered By: Worthy Keeler on 02/23/2020 14:01:13 Debra [name] ([id number]) -------------------------------------------------------------------------------- Progress Note Details Patient Name: Debra [name] Date of Service: 02/23/2020 1:15 PM Medical Record Number: [id number] Patient Account Number: [id number] Date of Birth/Sex: 31-Jan-1961 (59 y.o. 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 She tells me everything was fine up until a week ago. She noticed pain in her left groin area. By Saturday this it started to drain. She applied black salve to this area which she has done in the past and is helped heal problems in this area. This did not  work. She was seen in the ER on 03/27/18 with an abscess in the left groin. Noted to have surrounding cellulitis and an indurated area. She underwent an IandD. She was started on patient clindamycin. Her white count was 11.9. Comprehensive metabolic panel normal lactic acid level normal at 1.2. They did not do a culture that I can see. She was seen yesterday at her primary physician's office she may have been put on cephalexin at that point. She is using iodoform packing The patient is a type II diabetic with a history of PAD last hemoglobin A1c of 7.3. She has a history of parkinsonism, COPD, bipolar disorder hypertension. She is a continued 1 pack per day smoker She tells me that she does have a history of recurrent abscesses in this area last about a year ago. Mostly she seems to care for these herself and they seem to go away. 04/06/18; patient's surgical IandD on the lower left abdominal quadrant just above the symphysis pubis appears better. Following is better we've been using silver alginate. Culture I did last week did not grow MRSA rather Enterococcus faecalis. I substituted Augmentin for the clindamycin however the patient didn't have enough money, however she will pick it up this morning 04/13/18; surgical IandD on the lower left abdominal quadrant just above the symphysis pubis. Surface of this area looks healthy. This is an oval-shaped wound lying horizontally. Medially it has A, all with about 0.9 cm in depth there is no drainage no surrounding erythema. She is completing the Augmentin I gave her last week 04/27/18; surgical IandD site on the lower left abdominal quadrant just above the symphysis pubis. Surface of this wound is improved dimensions are better. We've been using silver alginate and border foam. She tells me she developed C. difficile has been treated with oral vancomycin. Will need to be very judicious with any further antibiotics 05/04/18; surgical IandD site on the lower left  abdomen just above the symphysis pubis. All of this is closed over except a small slit like area which is probably where the sinus tract was. There is no drainage no tenderness.  05/11/18; surgical IandD site on the left lower abdomen just above the symphysis pubis. All of this is closed. There is no open area here. No drainage and no tenderness she does have a history of abscesses but apparently none that have had difficulty healing like this one READMISSION 11/09/18 This is a patient we know from previous stays in this clinic. She apparently has a history of recurrent Cutaneous abscesses. We had her in the clinic last time with a lower abdomen/inguinal area abscess that required a surgical IandD. We managed to get this to close over. She also lives in fear of recurrent C. difficile and does not want systemic antibiotics. She tells [place] that she developed a small abscess in the wound area about 2-3 weeks ago. This spontaneously ruptured and had purulent material maintained. She is here for our review of the resultant wound 11/23/2018; interestingly the area of the opened for which we saw her 2 weeks ago has closed. This was likely an abscess given the clinical description although we did not really see it at the beginning. Over the timeframe since we have seen here she developed a second area just underneath the original one from last week. As her friend describes this it is a purple papule that is painful, develops purulence ruptures and then heals. Right now it is a flat open area. 12/14/2018; the patient has no open wound. By description these are said to be abscess these perform rupture and then formal wound. This is a second time we have had her for this. Readmission: 02/23/2020 patient presents for readmission in the clinic here today concerning issues that she has been having with a right gluteal fold abscess. She was actually seen in urgent care where they actually did perform an incision and  drainage. They also did place her on antibiotics as well. She is currently on doxycycline. With that being said the good news is this does not appear to be significantly infected although there is necrotic tissue I think we need to work on getting this cleaned out but nonetheless I do not see any signs of active local or systemic infection. She does have a history of diabetes mellitus type 2. Patient History Information obtained from Patient. ESMERALDA, MALAY ([id number]) Allergies Benadryl, canagliflozin, Depakote, Dilaudid, haloperidol, hydromorphone, Neosporin (neo-bac-polym), Singulair, Statins-Hmg-Coa Reductase Inhibitors, sitagliptin, Sulfa (Sulfonamide Antibiotics), Victoza Family History Cancer - Maternal Grandparents, Diabetes - Father, Hypertension - Father,Paternal Grandparents, Seizures - Siblings, Stroke - Mother, Thyroid Problems - Mother,Child, No family history of Heart Disease, Hereditary Spherocytosis, Kidney Disease, Lung Disease, Tuberculosis. Social History Current some day smoker - .5 pack day, Marital Status - Married, Alcohol Use - Rarely, Drug Use - No History, Caffeine Use - Daily. Medical History Eyes Denies history of Cataracts, Glaucoma, Optic Neuritis Ear/Nose/Mouth/Throat Denies history of Chronic sinus problems/congestion, Middle ear problems Hematologic/Lymphatic Denies history of Anemia, Hemophilia, Human Immunodeficiency Virus, Lymphedema, Sickle Cell Disease Respiratory Denies history of Aspiration, Asthma, Chronic Obstructive Pulmonary Disease (COPD), Pneumothorax, Sleep Apnea, Tuberculosis Cardiovascular Patient has history of Hypertension Denies history of Angina, Arrhythmia, Congestive Heart Failure, Coronary Artery Disease, Deep Vein Thrombosis, Hypotension, Myocardial Infarction, Peripheral Arterial Disease, Peripheral Venous Disease, Phlebitis, Vasculitis Gastrointestinal Denies history of Cirrhosis , Colitis, Crohn s, Hepatitis A, Hepatitis B,  Hepatitis C Endocrine Patient has history of Type II Diabetes Denies history of Type I Diabetes Genitourinary Denies history of End Stage Renal Disease Immunological Denies history of Lupus Erythematosus, Raynaud s, Scleroderma Integumentary (Skin) Denies history of History of  Burn, History of pressure wounds Musculoskeletal Patient has history of Osteoarthritis Denies history of Gout, Rheumatoid Arthritis, Osteomyelitis Neurologic Denies history of Dementia, Neuropathy, Quadriplegia, Paraplegia, Seizure Disorder Oncologic Denies history of Received Chemotherapy, Received Radiation Medical And Surgical History Notes Gastrointestinal history of C Diff Neurologic Parkinson's Objective Constitutional sitting or standing blood pressure is within target range for patient.. pulse regular and within target range for patient.[name] respirations regular, non-labored and within target range for patient.[name] temperature within target range for patient.. Well-nourished and well-hydrated in no acute distress. Vitals Time Taken: 1:40 PM, Height: 65 in, Weight: 293 lbs, BMI: 48.8, Temperature: 98.9 F, Pulse: 64 bpm, Respiratory Rate: 18 breaths/min, Blood Pressure: 122/64 mmHg. Debra [name] ([id number]) Eyes conjunctiva clear no eyelid edema noted. pupils equal round and reactive to light and accommodation. Ears, Nose, Mouth, and Throat no gross abnormality of ear auricles or external auditory canals. normal hearing noted during conversation. mucus membranes moist. Respiratory normal breathing without difficulty. Cardiovascular regular rate and rhythm with normal S1, S2. Musculoskeletal normal gait and posture. no significant deformity or arthritic changes, no loss or range of motion, no clubbing. Psychiatric this patient is able to make decisions and demonstrates good insight into disease process. Alert and Oriented x 3. pleasant and cooperative. General Notes: Upon inspection patient's wound bed  again showed some signs of necrotic tissue currently which did require sharp debridement at this point. I was able to use scissors and forceps to remove the majority of necrotic tissue from the surface and edges of the wound which the patient tolerated for the most part without any pain when she did have discomfort this was short-lived and towards the end of the procedure. The good news is post debridement I was able to remove the majority of the necrotic debris and again she seems to be doing quite well which is great news. No fevers, chills, nausea, vomiting, or diarrhea. Integumentary (Hair, Skin) Wound #4 status is Open. Original cause of wound was Gradually Appeared. The wound is located on the Right Gluteal fold. The wound measures 3cm length x 1cm width x 1.2cm depth; 2.356cm^2 area and 2.827cm^3 volume. There is Fat Layer (Subcutaneous Tissue) Exposed exposed. There is a medium amount of serous drainage noted. The wound margin is well defined and not attached to the wound base. There is no granulation within the wound bed. There is a large (67-100%) amount of necrotic tissue within the wound bed including Adherent Slough. Assessment Active Problems ICD-10 Cutaneous abscess of buttock Type 2 diabetes mellitus with other skin ulcer Non-pressure chronic ulcer of buttock with fat layer exposed Procedures Wound #4 Pre-procedure diagnosis of Wound #4 is an Abscess located on the Right Gluteal fold . There was a Selective/Open Wound Non-Viable Tissue Debridement with a total area of 3 sq cm performed by STONE III, Rosaline Ezekiel E., PA-C. With the following instrument(s): Forceps, and Scissors to remove Viable tissue/material. Material removed includes Eschar and Slough and after achieving pain control using Lidocaine. No specimens were taken. A time out was conducted at 14:05, prior to the start of the procedure. A Moderate amount of bleeding was controlled with Pressure. The procedure was tolerated  well. Post Debridement Measurements: 3cm length x 1cm width x 1.2cm depth; 2.827cm^3 volume. Character of Wound/Ulcer Post Debridement is stable. Post procedure Diagnosis Wound #4: Same as Pre-Procedure Plan Wound Cleansing: Wound #4 Right Gluteal fold: KRISTIAN, HAZZARD A. ([id number]) Cleanse wound with mild soap and water Anesthetic (add to Medication List): Wound #4 Right Gluteal  fold: Topical Lidocaine 4% cream applied to wound bed prior to debridement (In Clinic Only). Primary Wound Dressing: Wound #4 Right Gluteal fold: Silver Alginate Secondary Dressing: Wound #4 Right Gluteal fold: Boardered Foam Dressing Dressing Change Frequency: Wound #4 Right Gluteal fold: Change dressing every day. Follow-up Appointments: Wound #4 Right Gluteal fold: Return Appointment in 1 week. Off-Loading: Wound #4 Right Gluteal fold: Turn and reposition every 2 hours Additional Orders / Instructions: Wound #4 Right Gluteal fold: Increase protein intake. 1. My suggestion at this point is good to be that we go ahead and continue the alginate dressing that she has been utilizing up to this point they had some leftover at home I think that is actually a good option here for her though it does need to be packed into the wound. 2. I am also can recommend a bordered foam dressing to cover which she is done very well with as well. 3. I would recommend that the patient try to avoid sitting too long on this area turning and repositioning every 2 hours at least is going to be good. The good news is she is mobile and so is not necessarily sedentary all the time. We will see patient back for reevaluation in 1 week here in the clinic. If anything worsens or changes patient will contact our office for additional recommendations. Electronic Signature(s) Signed: 02/23/2020 3:08:03 PM By: Lenda Kelp PA-C Entered By: Lenda Kelp on 02/23/2020 15:08:03 Debra [name]  ([id number]) -------------------------------------------------------------------------------- ROS/PFSH Details Patient Name: Debra [name] Date of Service: 02/23/2020 1:15 PM Medical Record Number: [id number] Patient Account Number: [id number] Date of Birth/Sex: [birth year] (59 y.o. F) Treating RN: Huel Coventry Primary Care Provider: Danelle Berry Other Clinician: Referring Provider: Referral, Self Treating Provider/Extender: Linwood Dibbles, Traeh Milroy Weeks in Treatment: 0 Information Obtained From Patient Eyes Medical History: Negative for: Cataracts; Glaucoma; Optic Neuritis Ear/Nose/Mouth/Throat Medical History: Negative for: Chronic sinus problems/congestion; Middle ear problems Hematologic/Lymphatic Medical History: Negative for: Anemia; Hemophilia; Human Immunodeficiency Virus; Lymphedema; Sickle Cell Disease Respiratory Medical History: Negative for: Aspiration; Asthma; Chronic Obstructive Pulmonary Disease (COPD); Pneumothorax; Sleep Apnea; Tuberculosis Cardiovascular Medical History: Positive for: Hypertension Negative for: Angina; Arrhythmia; Congestive Heart Failure; Coronary Artery Disease; Deep Vein Thrombosis; Hypotension; Myocardial Infarction; Peripheral Arterial Disease; Peripheral Venous Disease; Phlebitis; Vasculitis Gastrointestinal Medical History: Negative for: Cirrhosis ; Colitis; Crohnos; Hepatitis A; Hepatitis B; Hepatitis C Past Medical History Notes: history of C Diff Endocrine Medical History: Positive for: Type II Diabetes Negative for: Type I Diabetes Time with diabetes: 10 yrs Treated with: Insulin Blood sugar tested every day: Yes Tested : 4x day Genitourinary Medical History: Negative for: End Stage Renal Disease Immunological Medical History: Negative for: Lupus Erythematosus; Raynaudos; Scleroderma Integumentary (Skin) Debra [name], Debra [name] ([id number]) Medical History: Negative for: History of Burn; History of pressure wounds Musculoskeletal Medical  History: Positive for: Osteoarthritis Negative for: Gout; Rheumatoid Arthritis; Osteomyelitis Neurologic Medical History: Negative for: Dementia; Neuropathy; Quadriplegia; Paraplegia; Seizure Disorder Past Medical History Notes: Parkinson's Oncologic Medical History: Negative for: Received Chemotherapy; Received Radiation Immunizations Pneumococcal Vaccine: Received Pneumococcal Vaccination: Yes Implantable Devices No devices added Family and Social History Cancer: Yes - Maternal Grandparents; Diabetes: Yes - Father; Heart Disease: No; Hereditary Spherocytosis: No; Hypertension: Yes - Father,Paternal Grandparents; Kidney Disease: No; Lung Disease: No; Seizures: Yes - Siblings; Stroke: Yes - Mother; Thyroid Problems: Yes - Mother,Child; Tuberculosis: No; Current some day smoker - .5 pack day; Marital Status - Married; Alcohol Use: Rarely; Drug Use: No History; Caffeine Use: Daily; Financial Concerns:  No; Food, Clothing or Shelter Needs: No; Support System Lacking: No; Transportation Concerns: No Electronic Signature(s) Signed: 02/23/2020 4:37:51 PM By: Lenda Kelp PA-C Signed: 02/23/2020 5:21:44 PM By: Elliot Gurney, BSN, RN, CWS, Taima RN, BSN Entered By: Elliot Gurney, BSN, RN, CWS, Nikayla on 02/23/2020 13:44:54 Debra [name] ([id number]) -------------------------------------------------------------------------------- SuperBill Details Patient Name: Debra [name] Date of Service: 02/23/2020 Medical Record Number: [id number] Patient Account Number: [id number] Date of Birth/Sex: September 29, 1961 (59 y.o. F) Treating RN: Curtis Sites Primary Care Provider: Danelle Berry Other Clinician: Referring Provider: Referral, Self Treating Provider/Extender: Linwood Dibbles, Aprile Dickenson Weeks in Treatment: 0 Diagnosis Coding ICD-10 Codes Code Description L02.31 Cutaneous abscess of buttock E11.622 Type 2 diabetes mellitus with other skin ulcer L98.412 Non-pressure chronic ulcer of buttock with fat layer exposed Facility  Procedures CPT4 Code: 62831517 Description: 99213 - WOUND CARE VISIT-LEV 3 EST PT Modifier: Quantity: 1 CPT4 Code: 61607371 Description: 97597 - DEBRIDE WOUND 1ST 20 SQ CM OR < Modifier: Quantity: 1 CPT4 Code: Description: ICD-10 Diagnosis Description L02.31 Cutaneous abscess of buttock L98.412 Non-pressure chronic ulcer of buttock with fat layer exposed Modifier: Quantity: Physician Procedures CPT4 Code: 0626948 Description: 99214 - WC PHYS LEVEL 4 - EST PT Modifier: 25 Quantity: 1 CPT4 Code: Description: ICD-10 Diagnosis Description L02.31 Cutaneous abscess of buttock E11.622 Type 2 diabetes mellitus with other skin ulcer L98.412 Non-pressure chronic ulcer of buttock with fat layer exposed Modifier: Quantity: CPT4 Code: 5462703 Description: 97597 - WC PHYS DEBR WO ANESTH 20 SQ CM Modifier: Quantity: 1 CPT4 Code: Description: ICD-10 Diagnosis Description L02.31 Cutaneous abscess of buttock L98.412 Non-pressure chronic ulcer of buttock with fat layer exposed Modifier: Quantity: Electronic Signature(s) Signed: 02/23/2020 3:08:30 PM By: Lenda Kelp PA-C Entered By: Lenda Kelp on 02/23/2020 15:08:30

## 2020-02-24 ENCOUNTER — Other Ambulatory Visit: Payer: Self-pay | Admitting: Psychiatry

## 2020-02-24 DIAGNOSIS — F3162 Bipolar disorder, current episode mixed, moderate: Secondary | ICD-10-CM

## 2020-03-01 ENCOUNTER — Encounter: Payer: Managed Care, Other (non HMO) | Admitting: Physician Assistant

## 2020-03-01 ENCOUNTER — Other Ambulatory Visit: Payer: Self-pay

## 2020-03-01 DIAGNOSIS — E11622 Type 2 diabetes mellitus with other skin ulcer: Secondary | ICD-10-CM | POA: Diagnosis not present

## 2020-03-01 NOTE — Progress Notes (Addendum)
FLORIS, NEUHAUS ([id number]) Visit Report for 03/01/2020 Chief Complaint Document Details Patient Name: Debra [name], Debra [name]. Date of Service: 03/01/2020 11:00 AM Medical Record Number: [id number] Patient Account Number: [id number] Date of Birth/Sex: 02/23/1961 (59 y.o. F) Treating RN: Army Melia Primary Care Provider: Delsa Grana Other Clinician: Referring Provider: Delsa Grana Treating Provider/Extender: Melburn Hake, Shekera Beavers Weeks in Treatment: 1 Information Obtained from: Patient Chief Complaint Right gluteal fold abscess Electronic Signature(s) Signed: 03/01/2020 11:03:46 AM By: Worthy Keeler PA-C Entered By: Worthy Keeler on 03/01/2020 11:03:46 Debra [name] ([id number]) -------------------------------------------------------------------------------- HPI Details Patient Name: Debra [name] Date of Service: 03/01/2020 11:00 AM Medical Record Number: [id number] Patient Account Number: [id number] Date of Birth/Sex: 1961-06-21 (59 y.o. 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 She tells me everything was fine up until a week ago. She noticed pain in her left groin area. By Saturday this it started to drain. She applied black salve to this area which she has done in the past and is helped heal problems in this area. This did not work. She was seen in the ER on 03/27/18 with an abscess in the left groin. Noted to have surrounding cellulitis and an indurated area. She underwent an IandD. She was started on patient clindamycin. Her white count was 11.9. Comprehensive metabolic panel normal lactic acid level normal at 1.2. They did not do a culture that I can see. She was seen yesterday at her primary physician's  office she may have been put on cephalexin at that point. She is using iodoform packing The patient is a type II diabetic with a history of PAD last hemoglobin A1c of 7.3. She has a history of parkinsonism, COPD, bipolar disorder hypertension. She is a continued 1 pack per day smoker She tells me that she does have a history of recurrent abscesses in this area last about a year ago. Mostly she seems to care for these herself and they seem to go away. 04/06/18; patient's surgical IandD on the lower left abdominal quadrant just above the symphysis pubis appears better. Following is better we've been using silver alginate. Culture I did last week did not grow MRSA rather Enterococcus faecalis. I substituted Augmentin for the clindamycin however the patient didn't have enough money, however she will pick it up this morning 04/13/18; surgical IandD on the lower left abdominal quadrant just above the symphysis pubis. Surface of this area looks healthy. This is an oval-shaped wound lying horizontally. Medially it has A, all with about 0.9 cm in depth there is no drainage no surrounding erythema. She is completing the Augmentin I gave her last week 04/27/18; surgical IandD site on the lower left abdominal quadrant just above the symphysis pubis. Surface of this wound is improved dimensions are better. We've been using silver alginate and border foam. She tells me she developed C. difficile has been treated with oral vancomycin. Will need to be very judicious with any further antibiotics 05/04/18; surgical IandD site on the lower left abdomen just above the symphysis pubis. All of this is closed over except a small slit like area which is probably where the sinus tract was. There is no drainage no tenderness. 05/11/18; surgical IandD site on the left lower abdomen just above the  symphysis pubis. All of this is closed. There is no open area here. No drainage and no tenderness she does have a history of abscesses but  apparently none that have had difficulty healing like this one READMISSION 11/09/18 This is a patient we know from previous stays in this clinic. She apparently has a history of recurrent Cutaneous abscesses. We had her in the clinic last time with a lower abdomen/inguinal area abscess that required a surgical IandD. We managed to get this to close over. She also lives in fear of recurrent C. difficile and does not want systemic antibiotics. She tells [place] that she developed a small abscess in the wound area about 2-3 weeks ago. This spontaneously ruptured and had purulent material maintained. She is here for our review of the resultant wound 11/23/2018; interestingly the area of the opened for which we saw her 2 weeks ago has closed. This was likely an abscess given the clinical description although we did not really see it at the beginning. Over the timeframe since we have seen here she developed a second area just underneath the original one from last week. As her friend describes this it is a purple papule that is painful, develops purulence ruptures and then heals. Right now it is a flat open area. 12/14/2018; the patient has no open wound. By description these are said to be abscess these perform rupture and then formal wound. This is a second time we have had her for this. Readmission: 02/23/2020 patient presents for readmission in the clinic here today concerning issues that she has been having with a right gluteal fold abscess. She was actually seen in urgent care where they actually did perform an incision and drainage. They also did place her on antibiotics as well. She is currently on doxycycline. With that being said the good news is this does not appear to be significantly infected although there is necrotic tissue I think we need to work on getting this cleaned out but nonetheless I do not see any signs of active local or systemic infection. She does have a history of diabetes mellitus  type 2. 03/01/2020 on evaluation today patient appears to be doing better with regard to her wound. She is measuring much smaller compared to prior evaluation. Fortunately there is no signs of active infection at this time. No fevers, chills, nausea, vomiting, or diarrhea. Electronic Signature(s) Signed: 03/01/2020 12:38:49 PM By: Lenda Kelp PA-C Entered By: Lenda Kelp on 03/01/2020 12:38:49 Debra [name] ([id number] Lux, Rosana Hoes ([id number]) -------------------------------------------------------------------------------- Physical Exam Details Patient Name: Debra [name] Date of Service: 03/01/2020 11:00 AM Medical Record Number: [id number] Patient Account Number: [id number] Date of Birth/Sex: 01-30-1961 (59 y.o. F) Treating RN: Rodell Perna Primary Care Provider: Danelle Berry Other Clinician: Referring Provider: Danelle Berry Treating Provider/Extender: STONE III, Josearmando Kuhnert Weeks in Treatment: 1 Constitutional Well-nourished and well-hydrated in no acute distress. Respiratory normal breathing without difficulty. Psychiatric this patient is able to make decisions and demonstrates good insight into disease process. Alert and Oriented x 3. pleasant and cooperative. Notes His wound did not require any sharp debridement at this point that is something that may need to be considered in the future if slough is significantly builds up the right now it appeared to be doing excellent. There is no evidence of active infection at this time either which is also good news. In general I feel like the patient is making excellent progress. Electronic Signature(s) Signed: 03/01/2020 12:39:04 PM By: Lenda Kelp  PA-C Entered By: Lenda Kelp on 03/01/2020 12:39:03 Debra [name] ([id number]) -------------------------------------------------------------------------------- Physician Orders Details Patient Name: Debra [name] Date of Service: 03/01/2020 11:00 AM Medical Record Number: [id number] Patient  Account Number: [id number] Date of Birth/Sex: Aug 22, 1961 (59 y.o. F) Treating RN: Rodell Perna Primary Care Provider: Danelle Berry Other Clinician: Referring Provider: Danelle Berry Treating Provider/Extender: Linwood Dibbles, Annalina Needles Weeks in Treatment: 1 Verbal / Phone Orders: No Diagnosis Coding ICD-10 Coding Code Description L02.31 Cutaneous abscess of buttock E11.622 Type 2 diabetes mellitus with other skin ulcer L98.412 Non-pressure chronic ulcer of buttock with fat layer exposed Wound Cleansing Wound #4 Right Gluteal fold o Cleanse wound with mild soap and water Anesthetic (add to Medication List) Wound #4 Right Gluteal fold o Topical Lidocaine 4% cream applied to wound bed prior to debridement (In Clinic Only). Primary Wound Dressing Wound #4 Right Gluteal fold o Silver Alginate Secondary Dressing Wound #4 Right Gluteal fold o Boardered Foam Dressing Dressing Change Frequency Wound #4 Right Gluteal fold o Change dressing every day. Follow-up Appointments Wound #4 Right Gluteal fold o Return Appointment in 1 week. Off-Loading Wound #4 Right Gluteal fold o Turn and reposition every 2 hours Additional Orders / Instructions Wound #4 Right Gluteal fold o Increase protein intake. Electronic Signature(s) Signed: 03/07/2020 5:29:46 PM By: Lenda Kelp PA-C Entered By: Lenda Kelp on 03/01/2020 11:20:12 Debra [name] ([id number]) -------------------------------------------------------------------------------- Problem List Details Patient Name: Debra [name] Date of Service: 03/01/2020 11:00 AM Medical Record Number: [id number] Patient Account Number: [id number] Date of Birth/Sex: 22-Jul-1961 (59 y.o. F) Treating RN: Rodell Perna Primary Care Provider: Danelle Berry Other Clinician: Referring Provider: Danelle Berry Treating Provider/Extender: Linwood Dibbles, Kenyia Wambolt Weeks in Treatment: 1 Active Problems ICD-10 Evaluated Encounter Code Description Active Date Today  Diagnosis L02.31 Cutaneous abscess of buttock 02/23/2020 No Yes E11.622 Type 2 diabetes mellitus with other skin ulcer 02/23/2020 No Yes L98.412 Non-pressure chronic ulcer of buttock with fat layer exposed 02/23/2020 No Yes Inactive Problems Resolved Problems Electronic Signature(s) Signed: 03/01/2020 11:03:41 AM By: Lenda Kelp PA-C Entered By: Lenda Kelp on 03/01/2020 11:03:40 Debra [name] ([id number]) -------------------------------------------------------------------------------- Progress Note Details Patient Name: Debra [name] Date of Service: 03/01/2020 11:00 AM Medical Record Number: [id number] Patient Account Number: [id number] Date of Birth/Sex: Feb 15, 1961 (58 y.o. 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 She tells me everything was fine up until a week ago. She noticed pain in her left groin area. By Saturday this it started to drain. She applied black salve to this area which she has done in the past and is helped heal problems in this area. This did not work. She was seen in the ER on 03/27/18 with an abscess in the left groin. Noted to have surrounding cellulitis and an indurated area. She underwent an IandD. She was started on patient clindamycin. Her white count was 11.9. Comprehensive metabolic panel normal lactic acid level normal at 1.2. They did not do a culture that I can see. She was seen yesterday at her primary physician's office she may have been put on cephalexin at that point. She is using iodoform packing The patient is a type II diabetic with a  history of PAD last hemoglobin A1c of 7.3.  She has a history of parkinsonism, COPD, bipolar disorder hypertension. She is a continued 1 pack per day smoker She tells me that she does have a history of recurrent abscesses in this area last about a year ago. Mostly she seems to care for these herself and they seem to go away. 04/06/18; patient's surgical IandD on the lower left abdominal quadrant just above the symphysis pubis appears better. Following is better we've been using silver alginate. Culture I did last week did not grow MRSA rather Enterococcus faecalis. I substituted Augmentin for the clindamycin however the patient didn't have enough money, however she will pick it up this morning 04/13/18; surgical IandD on the lower left abdominal quadrant just above the symphysis pubis. Surface of this area looks healthy. This is an oval-shaped wound lying horizontally. Medially it has A, all with about 0.9 cm in depth there is no drainage no surrounding erythema. She is completing the Augmentin I gave her last week 04/27/18; surgical IandD site on the lower left abdominal quadrant just above the symphysis pubis. Surface of this wound is improved dimensions are better. We've been using silver alginate and border foam. She tells me she developed C. difficile has been treated with oral vancomycin. Will need to be very judicious with any further antibiotics 05/04/18; surgical IandD site on the lower left abdomen just above the symphysis pubis. All of this is closed over except a small slit like area which is probably where the sinus tract was. There is no drainage no tenderness. 05/11/18; surgical IandD site on the left lower abdomen just above the symphysis pubis. All of this is closed. There is no open area here. No drainage and no tenderness she does have a history of abscesses but apparently none that have had difficulty healing like this one READMISSION 11/09/18 This is a patient we know from previous stays in this clinic. She apparently has a  history of recurrent Cutaneous abscesses. We had her in the clinic last time with a lower abdomen/inguinal area abscess that required a surgical IandD. We managed to get this to close over. She also lives in fear of recurrent C. difficile and does not want systemic antibiotics. She tells [place] that she developed a small abscess in the wound area about 2-3 weeks ago. This spontaneously ruptured and had purulent material maintained. She is here for our review of the resultant wound 11/23/2018; interestingly the area of the opened for which we saw her 2 weeks ago has closed. This was likely an abscess given the clinical description although we did not really see it at the beginning. Over the timeframe since we have seen here she developed a second area just underneath the original one from last week. As her friend describes this it is a purple papule that is painful, develops purulence ruptures and then heals. Right now it is a flat open area. 12/14/2018; the patient has no open wound. By description these are said to be abscess these perform rupture and then formal wound. This is a second time we have had her for this. Readmission: 02/23/2020 patient presents for readmission in the clinic here today concerning issues that she has been having with a right gluteal fold abscess. She was actually seen in urgent care where they actually did perform an incision and drainage. They also did place her on antibiotics as well. She is currently on doxycycline. With that being said the good news is this does  not appear to be significantly infected although there is necrotic tissue I think we need to work on getting this cleaned out but nonetheless I do not see any signs of active local or systemic infection. She does have a history of diabetes mellitus type 2. 03/01/2020 on evaluation today patient appears to be doing better with regard to her wound. She is measuring much smaller compared to prior evaluation.  Fortunately there is no signs of active infection at this time. No fevers, chills, nausea, vomiting, or diarrhea. Debra BowieKANE, Aamiyah A. ([id number]) Objective Constitutional Well-nourished and well-hydrated in no acute distress. Vitals Time Taken: 11:00 AM, Height: 65 in, Weight: 293 lbs, BMI: 48.8, Temperature: 98.8 F, Pulse: 61 bpm, Respiratory Rate: 20 breaths/min, Blood Pressure: 156/69 mmHg. Respiratory normal breathing without difficulty. Psychiatric this patient is able to make decisions and demonstrates good insight into disease process. Alert and Oriented x 3. pleasant and cooperative. General Notes: His wound did not require any sharp debridement at this point that is something that may need to be considered in the future if slough is significantly builds up the right now it appeared to be doing excellent. There is no evidence of active infection at this time either which is also good news. In general I feel like the patient is making excellent progress. Integumentary (Hair, Skin) Wound #4 status is Open. Original cause of wound was Gradually Appeared. The wound is located on the Right Gluteal fold. The wound measures 1.3cm length x 3.5cm width x 1.1cm depth; 3.574cm^2 area and 3.931cm^3 volume. There is Fat Layer (Subcutaneous Tissue) Exposed exposed. There is tunneling at 6:00 with a maximum distance of 2.5cm. There is a medium amount of serous drainage noted. The wound margin is well defined and not attached to the wound base. There is no granulation within the wound bed. There is a large (67-100%) amount of necrotic tissue within the wound bed including Adherent Slough. Assessment Active Problems ICD-10 Cutaneous abscess of buttock Type 2 diabetes mellitus with other skin ulcer Non-pressure chronic ulcer of buttock with fat layer exposed Plan Wound Cleansing: Wound #4 Right Gluteal fold: Cleanse wound with mild soap and water Anesthetic (add to Medication List): Wound #4 Right  Gluteal fold: Topical Lidocaine 4% cream applied to wound bed prior to debridement (In Clinic Only). Primary Wound Dressing: Wound #4 Right Gluteal fold: Silver Alginate Secondary Dressing: Wound #4 Right Gluteal fold: Boardered Foam Dressing Dressing Change Frequency: Wound #4 Right Gluteal fold: Change dressing every day. Follow-up Appointments: Debra BowieKANE, Latori A. ([id number]) Wound #4 Right Gluteal fold: Return Appointment in 1 week. Off-Loading: Wound #4 Right Gluteal fold: Turn and reposition every 2 hours Additional Orders / Instructions: Wound #4 Right Gluteal fold: Increase protein intake. 1. My suggestion at this time is going to be that we are going to go ahead and continue with the silver alginate dressing. I think that is actually doing quite well for her. We are having to pack the alginate down into the wound as this does have quite a bit of depth still at this point. 2. I am also can recommend at this time that we continue with the bordered foam dressing to cover we can order some of these for her as well I think this can be changed every other day and to be honest the border foam silicone is less likely to cause irritation right now the alginate has done well but the Band-Aid she has been using are causing a lot of trouble with irritation of the  periwound. 3. I do recommend continued and appropriate offloading as well. We will see patient back for reevaluation in 1 week here in the clinic. If anything worsens or changes patient will contact our office for additional recommendations. Electronic Signature(s) Signed: 03/01/2020 12:41:10 PM By: Lenda Kelp PA-C Previous Signature: 03/01/2020 12:40:38 PM Version By: Lenda Kelp PA-C Entered By: Lenda Kelp on 03/01/2020 12:41:10 Debra [name] ([id number]) -------------------------------------------------------------------------------- SuperBill Details Patient Name: Debra [name] Date of Service: 03/01/2020 Medical  Record Number: [id number] Patient Account Number: [id number] Date of Birth/Sex: Jul 12, 1961 (59 y.o. F) Treating RN: Rodell Perna Primary Care Provider: Danelle Berry Other Clinician: Referring Provider: Danelle Berry Treating Provider/Extender: Linwood Dibbles, Ehren Berisha Weeks in Treatment: 1 Diagnosis Coding ICD-10 Codes Code Description L02.31 Cutaneous abscess of buttock E11.622 Type 2 diabetes mellitus with other skin ulcer L98.412 Non-pressure chronic ulcer of buttock with fat layer exposed Facility Procedures CPT4 Code: 44010272 Description: 99213 - WOUND CARE VISIT-LEV 3 EST PT Modifier: Quantity: 1 Physician Procedures CPT4 Code: 5366440 Description: 99214 - WC PHYS LEVEL 4 - EST PT Modifier: Quantity: 1 CPT4 Code: Description: ICD-10 Diagnosis Description L02.31 Cutaneous abscess of buttock E11.622 Type 2 diabetes mellitus with other skin ulcer L98.412 Non-pressure chronic ulcer of buttock with fat layer exposed Modifier: Quantity: Electronic Signature(s) Signed: 03/01/2020 12:41:18 PM By: Lenda Kelp PA-C Previous Signature: 03/01/2020 12:40:54 PM Version By: Lenda Kelp PA-C Entered By: Lenda Kelp on 03/01/2020 12:41:18

## 2020-03-01 NOTE — Progress Notes (Signed)
RICKIE, GUTIERRES ([id number]) Visit Report for 03/01/2020 Arrival Information Details Patient Name: Debra [name], Debra [name]. Date of Service: 03/01/2020 11:00 AM Medical Record Number: [id number] Patient Account Number: [id number] Date of Birth/Sex: [birth year] (59 y.o. F) Treating RN: Rodell Perna Primary Care Zenia Guest: Danelle Berry Other Clinician: Referring Sherhonda Gaspar: Danelle Berry Treating Stephanine Reas/Extender: Linwood Dibbles, HOYT Weeks in Treatment: 1 Visit Information History Since Last Visit Added or deleted any medications: No Patient Arrived: Ambulatory Any new allergies or adverse reactions: No Arrival Time: 10:58 Had a fall or experienced change in No Accompanied By: self activities of daily living that may affect Transfer Assistance: None risk of falls: Patient Identification Verified: Yes Signs or symptoms of abuse/neglect since last visito No Secondary Verification Process Completed: Yes Hospitalized since last visit: No Implantable device outside of the clinic excluding No cellular tissue based products placed in the center since last visit: Has Dressing in Place as Prescribed: Yes Pain Present Now: No Electronic Signature(s) Signed: 03/01/2020 4:08:02 PM By: Dayton Martes RCP, RRT, CHT Entered By: Weyman Rodney, Lucio Edward on 03/01/2020 10:59:33 Debra [name] ([id number]) -------------------------------------------------------------------------------- Clinic Level of Care Assessment Details Patient Name: Debra [name] Date of Service: 03/01/2020 11:00 AM Medical Record Number: [id number] Patient Account Number: [id number] Date of Birth/Sex: 1961/04/16 (59 y.o. F) Treating RN: Rodell Perna Primary Care Deaven Urwin: Danelle Berry Other Clinician: Referring Arvie Villarruel: Danelle Berry Treating Lafreda Casebeer/Extender: Linwood Dibbles, HOYT Weeks in Treatment: 1 Clinic Level of Care Assessment Items TOOL 4 Quantity Score []  - Use when only an EandM is performed on FOLLOW-UP visit 0 ASSESSMENTS  - Nursing Assessment / Reassessment X - Reassessment of Co-morbidities (includes updates in patient status) 1 10 X- 1 5 Reassessment of Adherence to Treatment Plan ASSESSMENTS - Wound and Skin Assessment / Reassessment X - Simple Wound Assessment / Reassessment - one wound 1 5 []  - 0 Complex Wound Assessment / Reassessment - multiple wounds []  - 0 Dermatologic / Skin Assessment (not related to wound area) ASSESSMENTS - Focused Assessment []  - Circumferential Edema Measurements - multi extremities 0 []  - 0 Nutritional Assessment / Counseling / Intervention []  - 0 Lower Extremity Assessment (monofilament, tuning fork, pulses) []  - 0 Peripheral Arterial Disease Assessment (using hand held doppler) ASSESSMENTS - Ostomy and/or Continence Assessment and Care []  - Incontinence Assessment and Management 0 []  - 0 Ostomy Care Assessment and Management (repouching, etc.) PROCESS - Coordination of Care X - Simple Patient / Family Education for ongoing care 1 15 []  - 0 Complex (extensive) Patient / Family Education for ongoing care []  - 0 Staff obtains , Records, Test Results / Process Orders []  - 0 Staff telephones HHA, Nursing Homes / Clarify orders / etc []  - 0 Routine Transfer to another Facility (non-emergent condition) []  - 0 Routine Hospital Admission (non-emergent condition) []  - 0 New Admissions / / Ordering NPWT, Apligraf, etc. []  - 0 Emergency Hospital Admission (emergent condition) X- 1 10 Simple Discharge Coordination []  - 0 Complex (extensive) Discharge Coordination PROCESS - Special Needs []  - Pediatric / Minor Patient Management 0 []  - 0 Isolation Patient Management []  - 0 Hearing / Language / Visual special needs []  - 0 Assessment of Community assistance (transportation, D/C planning, etc.) Debra [name], Debra [name] ( ) []  - 0 Additional assistance / Altered mentation []  - 0 Support Surface(s) Assessment (bed, cushion, seat,  etc.) INTERVENTIONS - Wound Cleansing / Measurement X - Simple Wound Cleansing - one wound 1 5 []  - 0 Complex Wound Cleansing - multiple wounds  X- 1 5 Wound Imaging (photographs - any number of wounds) []  - 0 Wound Tracing (instead of photographs) X- 1 5 Simple Wound Measurement - one wound []  - 0 Complex Wound Measurement - multiple wounds INTERVENTIONS - Wound Dressings []  - Small Wound Dressing one or multiple wounds 0 X- 1 15 Medium Wound Dressing one or multiple wounds []  - 0 Large Wound Dressing one or multiple wounds []  - 0 Application of Medications - topical []  - 0 Application of Medications - injection INTERVENTIONS - Miscellaneous []  - External ear exam 0 []  - 0 Specimen Collection (cultures, biopsies, blood, body fluids, etc.) []  - 0 Specimen(s) / Culture(s) sent or taken to Lab for analysis []  - 0 Patient Transfer (multiple staff / / Similar devices) []  - 0 Simple Staple / Suture removal (25 or less) []  - 0 Complex Staple / Suture removal (26 or more) []  - 0 Hypo / Hyperglycemic Management (close monitor of Blood Glucose) []  - 0 Ankle / Brachial Index (ABI) - do not check if billed separately X- 1 5 Vital Signs Has the patient been seen at the hospital within the last three years: Yes Total Score: 80 Level Of Care: New/Established - Level 3 Electronic Signature(s) Signed: 03/01/2020 11:26:24 AM By: Entered By: on 03/01/2020 11:25:38 ( ) -------------------------------------------------------------------------------- Encounter Discharge Information Details Patient Name: . Date of Service: 03/01/2020 11:00 AM Medical Record Number: Patient Account Number: Nurse, adult Date of Birth/Sex: 1961-08-17 (59 y.o. F) Treating RN: Primary Care Dade Rodin: Other Clinician: Referring Eve Rey: 03/03/2020 Treating Laylah Riga/Extender: Rodell Perna, HOYT Weeks in  Treatment: 1 Encounter Discharge Information Items Discharge Condition: Stable Ambulatory Status: Ambulatory Discharge Destination: Home Transportation: Private Auto Accompanied By: self Schedule Follow-up Appointment: Yes Clinical Summary of Care: Electronic Signature(s) Signed: 03/01/2020 11:26:15 AM By: 03/03/2020 Entered By: Debra [name] on 03/01/2020 11:26:14 Debra [name] (03/03/2020) -------------------------------------------------------------------------------- Lower Extremity Assessment Details Patient Name: [id number] Date of Service: 03/01/2020 11:00 AM Medical Record Number: 11/05/1961 Patient Account Number: [id number] Date of Birth/Sex: 1961-08-28 (59 y.o. F) Treating RN: Danelle Berry Primary Care Nickey Kloepfer: Linwood Dibbles Other Clinician: Referring Jalaina Salyers: 03/03/2020 Treating Imir Brumbach/Extender: Rodell Perna, HOYT Weeks in Treatment: 1 Electronic Signature(s) Signed: 03/01/2020 11:26:24 AM By: 03/03/2020 Entered By: Debra [name] on 03/01/2020 11:10:24 Debra [name] (03/03/2020) -------------------------------------------------------------------------------- Multi Wound Chart Details Patient Name: [id number] Date of Service: 03/01/2020 11:00 AM Medical Record Number: 11/05/1961 Patient Account Number: [id number] Date of Birth/Sex: Jan 10, 1961 (59 y.o. F) Treating RN: Danelle Berry Primary Care Piper Hassebrock: Linwood Dibbles Other Clinician: Referring Youssef Footman: 03/03/2020 Treating Nemiah Bubar/Extender: Rodell Perna, HOYT Weeks in Treatment: 1 Vital Signs Height(in): 65 Pulse(bpm): 61 Weight(lbs): 293 Blood Pressure(mmHg): 156/69 Body Mass Index(BMI): 49 Temperature(F): 98.8 Respiratory Rate(breaths/min): 20 Photos: [N/A:N/A] Wound Location: Right Gluteal fold N/A N/A Wounding Event: Gradually Appeared N/A N/A Primary Etiology: Abscess N/A N/A Comorbid History: Hypertension, Type II Diabetes, N/A N/A Osteoarthritis Date Acquired: 02/16/2020 N/A N/A Weeks of Treatment:  1 N/A N/A Wound Status: Open N/A N/A Measurements L x W x D (cm) 1.3x3.5x1.1 N/A N/A Area (cm) : 3.574 N/A N/A Volume (cm) : 3.931 N/A N/A % Reduction in Area: -51.70% N/A N/A % Reduction in Volume: -39.10% N/A N/A Position 1 (o'clock): 6 Maximum Distance 1 (cm): 2.5 Tunneling: Yes N/A N/A Classification: Full Thickness Without Exposed N/A N/A Support Structures Exudate Amount: Medium N/A N/A Exudate Type: Serous N/A N/A Exudate Color:  amber N/A N/A Wound Margin: Well defined, not attached N/A N/A Granulation Amount: None Present (0%) N/A N/A Necrotic Amount: Large (67-100%) N/A N/A Exposed Structures: Fat Layer (Subcutaneous Tissue) N/A N/A Exposed: Yes Fascia: No Tendon: No Muscle: No Joint: No Bone: No Epithelialization: None N/A N/A Treatment Notes Electronic Signature(s) Signed: 03/01/2020 11:25:22 AM By: Wenda Low ([id number]) Entered By: Army Melia on 03/01/2020 11:25:22 Debra [name] ([id number]) -------------------------------------------------------------------------------- Multi-Disciplinary Care Plan Details Patient Name: Debra [name]. Date of Service: 03/01/2020 11:00 AM Medical Record Number: [id number] Patient Account Number: [id number] Date of Birth/Sex: 1961-02-22 (59 y.o. F) Treating RN: Army Melia Primary Care Ilyssa Grennan: Delsa Grana Other Clinician: Referring Kaprice Kage: Delsa Grana Treating Areanna Gengler/Extender: Melburn Hake, HOYT Weeks in Treatment: 1 Active Inactive Necrotic Tissue Nursing Diagnoses: Impaired tissue integrity related to necrotic/devitalized tissue Goals: Necrotic/devitalized tissue will be minimized in the wound bed Date Initiated: 02/23/2020 Target Resolution Date: 03/06/2020 Goal Status: Active Interventions: Assess patient pain level pre-, during and post procedure and prior to discharge Treatment Activities: Excisional debridement : 02/23/2020 Notes: Orientation to the Wound Care Program Nursing  Diagnoses: Knowledge deficit related to the wound healing center program Goals: Patient/caregiver will verbalize understanding of the [facility] Date Initiated: 02/23/2020 Target Resolution Date: 03/06/2020 Goal Status: Active Interventions: Provide education on orientation to the wound center Notes: Soft Tissue Infection Nursing Diagnoses: Impaired tissue integrity Knowledge deficit related to home infection control: handwashing, handling of soiled dressings, supply storage Goals: Patient will remain free of wound infection Date Initiated: 02/23/2020 Target Resolution Date: 03/06/2020 Goal Status: Active Interventions: Assess signs and symptoms of infection every visit Notes: Wound/Skin Impairment Debra [name], Debra [name] ([id number]) Nursing Diagnoses: Impaired tissue integrity Goals: Ulcer/skin breakdown will have a volume reduction of 30% by week 4 Date Initiated: 02/23/2020 Target Resolution Date: 03/25/2020 Goal Status: Active Interventions: Assess patient/caregiver ability to obtain necessary supplies Treatment Activities: Skin care regimen initiated : 02/23/2020 Topical wound management initiated : 02/23/2020 Notes: Electronic Signature(s) Signed: 03/01/2020 11:25:15 AM By: Army Melia Entered By: Army Melia on 03/01/2020 11:25:14 Debra [name] ([id number]) -------------------------------------------------------------------------------- Pain Assessment Details Patient Name: Debra [name]. Date of Service: 03/01/2020 11:00 AM Medical Record Number: [id number] Patient Account Number: [id number] Date of Birth/Sex: 1961-06-16 (59 y.o. F) Treating RN: Army Melia Primary Care Geral Tuch: Delsa Grana Other Clinician: Referring Tynesha Free: Delsa Grana Treating Tyra Michelle/Extender: Melburn Hake, HOYT Weeks in Treatment: 1 Active Problems Location of Pain Severity and Description of Pain Patient Has Paino No Site Locations Pain Management and Medication Current Pain  Management: Electronic Signature(s) Signed: 03/01/2020 11:26:24 AM By: Army Melia Entered By: Army Melia on 03/01/2020 11:09:31 Debra [name] ([id number]) -------------------------------------------------------------------------------- Patient/Caregiver Education Details Patient Name: Debra [name]. Date of Service: 03/01/2020 11:00 AM Medical Record Number: [id number] Patient Account Number: [id number] Date of Birth/Gender: 16-Feb-1961 (59 y.o. F) Treating RN: Army Melia Primary Care Physician: Delsa Grana Other Clinician: Referring Physician: Delsa Grana Treating Physician/Extender: Sharalyn Ink in Treatment: 1 Education Assessment Education Provided To: Patient Education Topics Provided Wound/Skin Impairment: Handouts: Caring for Your Ulcer Methods: Demonstration, Explain/Verbal Responses: State content correctly Electronic Signature(s) Signed: 03/01/2020 11:26:24 AM By: Army Melia Entered By: Army Melia on 03/01/2020 11:25:50 Debra [name] ([id number]) -------------------------------------------------------------------------------- Wound Assessment Details Patient Name: Debra [name] Date of Service: 03/01/2020 11:00 AM Medical Record Number: [id number] Patient Account Number: [id number] Date of Birth/Sex: [birth year] (59 y.o. F) Treating RN: Army Melia Primary Care Kaleab Frasier: Delsa Grana Other Clinician: Referring Katurah Karapetian: Delsa Grana Treating  Kasiyah Platter/Extender: Linwood Dibbles, HOYT Weeks in Treatment: 1 Wound Status Wound Number: 4 Primary Etiology: Abscess Wound Location: Right Gluteal fold Wound Status: Open Wounding Event: Gradually Appeared Comorbid History: Hypertension, Type II Diabetes, Osteoarthritis Date Acquired: 02/16/2020 Weeks Of Treatment: 1 Clustered Wound: No Photos Wound Measurements Length: (cm) 1.3 Width: (cm) 3.5 Depth: (cm) 1.1 Area: (cm) 3.574 Volume: (cm) 3.931 % Reduction in Area: -51.7% % Reduction in Volume:  -39.1% Epithelialization: None Tunneling: Yes Position (o'clock): 6 Maximum Distance: (cm) 2.5 Wound Description Classification: Full Thickness Without Exposed Support Structu Wound Margin: Well defined, not attached Exudate Amount: Medium Exudate Type: Serous Exudate Color: amber res Foul Odor After Cleansing: No Slough/Fibrino Yes Wound Bed Granulation Amount: None Present (0%) Exposed Structure Necrotic Amount: Large (67-100%) Fascia Exposed: No Necrotic Quality: Adherent Slough Fat Layer (Subcutaneous Tissue) Exposed: Yes Tendon Exposed: No Muscle Exposed: No Joint Exposed: No Bone Exposed: No Treatment Notes Wound #4 (Right Gluteal fold) Notes silver cell packed into wound, brdered foam dressing Debra [name], Debra [name] ([id number]) Electronic Signature(s) Signed: 03/01/2020 11:26:24 AM By: Rodell Perna Entered By: Rodell Perna on 03/01/2020 11:10:13 Debra [name] ([id number]) -------------------------------------------------------------------------------- Vitals Details Patient Name: Debra [name]. Date of Service: 03/01/2020 11:00 AM Medical Record Number: [id number] Patient Account Number: [id number] Date of Birth/Sex: [birth year] (59 y.o. F) Treating RN: Rodell Perna Primary Care Djimon Lundstrom: Danelle Berry Other Clinician: Referring Delorese Sellin: Danelle Berry Treating Merriam Brandner/Extender: Linwood Dibbles, HOYT Weeks in Treatment: 1 Vital Signs Time Taken: 11:00 Temperature (F): 98.8 Height (in): 65 Pulse (bpm): 61 Weight (lbs): 293 Respiratory Rate (breaths/min): 20 Body Mass Index (BMI): 48.8 Blood Pressure (mmHg): 156/69 Reference Range: 80 - 120 mg / dl Electronic Signature(s) Signed: 03/01/2020 4:08:02 PM By: Dayton Martes RCP, RRT, CHT Entered By: Dayton Martes on 03/01/2020 11:02:26

## 2020-03-04 ENCOUNTER — Encounter: Payer: Self-pay | Admitting: Family Medicine

## 2020-03-04 MED ORDER — REPATHA SURECLICK 140 MG/ML ~~LOC~~ SOAJ: 140.0000 mg | SUBCUTANEOUS | 0 refills | Status: DC

## 2020-03-06 ENCOUNTER — Ambulatory Visit
Admission: RE | Admit: 2020-03-06 | Discharge: 2020-03-06 | Disposition: A | Discharge status: Home / Self Care | Payer: Managed Care, Other (non HMO) | Source: Ambulatory Visit | Attending: Family Medicine | Admitting: Family Medicine

## 2020-03-06 DIAGNOSIS — Z1231 Encounter for screening mammogram for malignant neoplasm of breast: Secondary | ICD-10-CM

## 2020-03-08 ENCOUNTER — Other Ambulatory Visit: Payer: Self-pay

## 2020-03-08 ENCOUNTER — Encounter: Payer: Managed Care, Other (non HMO) | Attending: Physician Assistant | Admitting: Physician Assistant

## 2020-03-08 DIAGNOSIS — E11622 Type 2 diabetes mellitus with other skin ulcer: Secondary | ICD-10-CM | POA: Diagnosis not present

## 2020-03-08 DIAGNOSIS — L0231 Cutaneous abscess of buttock: Secondary | ICD-10-CM | POA: Insufficient documentation

## 2020-03-08 DIAGNOSIS — F17210 Nicotine dependence, cigarettes, uncomplicated: Secondary | ICD-10-CM | POA: Insufficient documentation

## 2020-03-08 DIAGNOSIS — L98412 Non-pressure chronic ulcer of buttock with fat layer exposed: Secondary | ICD-10-CM | POA: Insufficient documentation

## 2020-03-08 NOTE — Progress Notes (Addendum)
CORRIE, REDER ([id number]) Visit Report for 03/08/2020 Chief Complaint Document Details Patient Name: Debra [name], Debra [name]. Date of Service: 03/08/2020 9:30 AM Medical Record Number: [id number] Patient Account Number: [id number] Date of Birth/Sex: 1961/11/20 (59 y.o. F) Treating RN: Curtis Sites Primary Care Provider: Danelle Berry Other Clinician: Referring Provider: Danelle Berry Treating Provider/Extender: Linwood Dibbles, Nikos Anglemyer Weeks in Treatment: 2 Information Obtained from: Patient Chief Complaint Right gluteal fold abscess Electronic Signature(s) Signed: 03/08/2020 9:52:26 AM By: Lenda Kelp PA-C Entered By: Lenda Kelp on 03/08/2020 09:52:25 Debra [name] ([id number]) -------------------------------------------------------------------------------- HPI Details Patient Name: Debra [name] Date of Service: 03/08/2020 9:30 AM Medical Record Number: [id number] Patient Account Number: [id number] Date of Birth/Sex: Mar 18, 1961 (59 y.o. 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 She tells me everything was fine up until a week ago. She noticed pain in her left groin area. By Saturday this it started to drain. She applied black salve to this area which she has done in the past and is helped heal problems in this area. This did not work. She was seen in the ER on 03/27/18 with an abscess in the left groin. Noted to have surrounding cellulitis and an indurated area. She underwent an IandD. She was started on patient clindamycin. Her white count was 11.9. Comprehensive metabolic panel normal lactic acid level normal at 1.2. They did not do a culture that I can see. She was seen yesterday at her primary physician's  office she may have been put on cephalexin at that point. She is using iodoform packing The patient is a type II diabetic with a history of PAD last hemoglobin A1c of 7.3. She has a history of parkinsonism, COPD, bipolar disorder hypertension. She is a continued 1 pack per day smoker She tells me that she does have a history of recurrent abscesses in this area last about a year ago. Mostly she seems to care for these herself and they seem to go away. 04/06/18; patient's surgical IandD on the lower left abdominal quadrant just above the symphysis pubis appears better. Following is better we've been using silver alginate. Culture I did last week did not grow MRSA rather Enterococcus faecalis. I substituted Augmentin for the clindamycin however the patient didn't have enough money, however she will pick it up this morning 04/13/18; surgical IandD on the lower left abdominal quadrant just above the symphysis pubis. Surface of this area looks healthy. This is an oval-shaped wound lying horizontally. Medially it has A, all with about 0.9 cm in depth there is no drainage no surrounding erythema. She is completing the Augmentin I gave her last week 04/27/18; surgical IandD site on the lower left abdominal quadrant just above the symphysis pubis. Surface of this wound is improved dimensions are better. We've been using silver alginate and border foam. She tells me she developed C. difficile has been treated with oral vancomycin. Will need to be very judicious with any further antibiotics 05/04/18; surgical IandD site on the lower left abdomen just above the symphysis pubis. All of this is closed over except a small slit like area which is probably where the sinus tract was. There is no drainage no tenderness. 05/11/18; surgical IandD site on the left lower abdomen just above the  symphysis pubis. All of this is closed. There is no open area here. No drainage and no tenderness she does have a history of abscesses but  apparently none that have had difficulty healing like this one READMISSION 11/09/18 This is a patient we know from previous stays in this clinic. She apparently has a history of recurrent Cutaneous abscesses. We had her in the clinic last time with a lower abdomen/inguinal area abscess that required a surgical IandD. We managed to get this to close over. She also lives in fear of recurrent C. difficile and does not want systemic antibiotics. She tells [place] that she developed a small abscess in the wound area about 2-3 weeks ago. This spontaneously ruptured and had purulent material maintained. She is here for our review of the resultant wound 11/23/2018; interestingly the area of the opened for which we saw her 2 weeks ago has closed. This was likely an abscess given the clinical description although we did not really see it at the beginning. Over the timeframe since we have seen here she developed a second area just underneath the original one from last week. As her friend describes this it is a purple papule that is painful, develops purulence ruptures and then heals. Right now it is a flat open area. 12/14/2018; the patient has no open wound. By description these are said to be abscess these perform rupture and then formal wound. This is a second time we have had her for this. Readmission: 02/23/2020 patient presents for readmission in the clinic here today concerning issues that she has been having with a right gluteal fold abscess. She was actually seen in urgent care where they actually did perform an incision and drainage. They also did place her on antibiotics as well. She is currently on doxycycline. With that being said the good news is this does not appear to be significantly infected although there is necrotic tissue I think we need to work on getting this cleaned out but nonetheless I do not see any signs of active local or systemic infection. She does have a history of diabetes mellitus  type 2. 03/01/2020 on evaluation today patient appears to be doing better with regard to her wound. She is measuring much smaller compared to prior evaluation. Fortunately there is no signs of active infection at this time. No fevers, chills, nausea, vomiting, or diarrhea. 03/08/2020 upon evaluation today patient appears to be doing well with regard to her wound in my opinion. She is showing some signs of improvement here which is excellent news. There is no evidence of active infection at this time. No fevers, chills, nausea, vomiting, or diarrhea. Overall I am extremely pleased with the progress that she is making. She did have an abscess area on the opposite side in the similar location that she notes popped up in the past couple of days. I did look at that as well that had a bandage on it there was really no significant drainage and did not appear to [facility], Rami A. ([id number]) be thing actually open I believe this may be more of a hair bump type issue. With that being said I do think that she may have frequent folliculitis/abscesses that need to be addressed. I think that we may have some recommendations for her here. Electronic Signature(s) Signed: 03/08/2020 10:10:08 AM By: Lenda Kelp PA-C Entered By: Lenda Kelp on 03/08/2020 10:10:08 Debra [name] ([id number]) -------------------------------------------------------------------------------- Physical Exam Details Patient Name: Debra [name] Date of Service: 03/08/2020  9:30 AM Medical Record Number: [id number] Patient Account Number: [id number] Date of Birth/Sex: 1961-06-09 (58 y.o. F) Treating RN: Curtis Sites Primary Care Provider: Danelle Berry Other Clinician: Referring Provider: Danelle Berry Treating Provider/Extender: STONE III, Nikan Ellingson Weeks in Treatment: 2 Constitutional Well-nourished and well-hydrated in no acute distress. Respiratory normal breathing without difficulty. Psychiatric this patient is able to make decisions and  demonstrates good insight into disease process. Alert and Oriented x 3. pleasant and cooperative. Notes Patient's wound bed currently showed signs of good granulation at this time. Fortunately there is no evidence of active infection which is great news. No fevers, chills, nausea, vomiting, or diarrhea. Electronic Signature(s) Signed: 03/08/2020 10:10:23 AM By: Lenda Kelp PA-C Entered By: Lenda Kelp on 03/08/2020 10:10:23 Debra [name] ([id number]) -------------------------------------------------------------------------------- Physician Orders Details Patient Name: Debra [name] Date of Service: 03/08/2020 9:30 AM Medical Record Number: [id number] Patient Account Number: [id number] Date of Birth/Sex: 05-09-1961 (59 y.o. F) Treating RN: Curtis Sites Primary Care Provider: Danelle Berry Other Clinician: Referring Provider: Danelle Berry Treating Provider/Extender: Linwood Dibbles, Cristalle Rohm Weeks in Treatment: 2 Verbal / Phone Orders: No Diagnosis Coding ICD-10 Coding Code Description L02.31 Cutaneous abscess of buttock E11.622 Type 2 diabetes mellitus with other skin ulcer L98.412 Non-pressure chronic ulcer of buttock with fat layer exposed Wound Cleansing Wound #4 Right Gluteal fold o Cleanse wound with mild soap and water o Other: - Hibiclens soap (generic brand is fine) - wash head to toe every day for 1 week then once weekly for 6 weeks Anesthetic (add to Medication List) Wound #4 Right Gluteal fold o Topical Lidocaine 4% cream applied to wound bed prior to debridement (In Clinic Only). Primary Wound Dressing Wound #4 Right Gluteal fold o Silver Alginate Secondary Dressing Wound #4 Right Gluteal fold o Boardered Foam Dressing - cover area on the left for protection too Dressing Change Frequency Wound #4 Right Gluteal fold o Change dressing every day. Follow-up Appointments Wound #4 Right Gluteal fold o Return Appointment in 1 week. Off-Loading Wound #4 Right  Gluteal fold o Turn and reposition every 2 hours Additional Orders / Instructions Wound #4 Right Gluteal fold o Increase protein intake. Electronic Signature(s) Signed: 03/08/2020 12:28:55 PM By: Curtis Sites Signed: 03/11/2020 9:03:29 AM By: Lenda Kelp PA-C Entered By: Curtis Sites on 03/08/2020 10:12:53 Debra [name] ([id number]) -------------------------------------------------------------------------------- Problem List Details Patient Name: Debra [name] Date of Service: 03/08/2020 9:30 AM Medical Record Number: [id number] Patient Account Number: [id number] Date of Birth/Sex: [birth year] (59 y.o. F) Treating RN: Curtis Sites Primary Care Provider: Danelle Berry Other Clinician: Referring Provider: Danelle Berry Treating Provider/Extender: Linwood Dibbles, Annet Manukyan Weeks in Treatment: 2 Active Problems ICD-10 Evaluated Encounter Code Description Active Date Today Diagnosis L02.31 Cutaneous abscess of buttock 02/23/2020 No Yes E11.622 Type 2 diabetes mellitus with other skin ulcer 02/23/2020 No Yes L98.412 Non-pressure chronic ulcer of buttock with fat layer exposed 02/23/2020 No Yes Inactive Problems Resolved Problems Electronic Signature(s) Signed: 03/08/2020 9:52:20 AM By: Lenda Kelp PA-C Entered By: Lenda Kelp on 03/08/2020 09:52:20 Debra [name] ([id number]) -------------------------------------------------------------------------------- Progress Note Details Patient Name: Debra [name] Date of Service: 03/08/2020 9:30 AM Medical Record Number: [id number] Patient Account Number: [id number] Date of Birth/Sex: 23-Aug-1961 (59 y.o. 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 She tells me everything was fine up until a week ago. She noticed pain in her left groin area. By Saturday this it started to drain. She applied black salve to this area which she has done in the past and is helped heal problems in this area. This did not work. She was seen in the ER on 03/27/18 with an abscess in the left groin. Noted to have surrounding cellulitis and an indurated area. She underwent an IandD. She was started on patient clindamycin. Her white count was 11.9. Comprehensive metabolic panel normal lactic acid level normal at 1.2. They did not do a culture that I can see. She was seen yesterday at her primary physician's office she may have been put on cephalexin at that point. She is using iodoform packing The patient is a type II diabetic with a history of PAD last hemoglobin A1c of 7.3. She has a history of parkinsonism, COPD, bipolar disorder hypertension. She is a continued 1 pack per day smoker She tells me that she does have a history of recurrent abscesses in this area last about a year ago. Mostly she seems to care for these herself and they seem to go away. 04/06/18; patient's surgical IandD on the lower left abdominal quadrant just above the symphysis pubis appears better. Following is better we've been using silver alginate. Culture I did last week did not grow MRSA rather Enterococcus faecalis. I substituted Augmentin for the clindamycin however the patient didn't have enough money, however she will pick it up this morning 04/13/18; surgical IandD on the lower left abdominal quadrant just above the symphysis pubis. Surface of this area looks healthy. This is an oval-shaped wound lying horizontally. Medially it has A, all with about 0.9 cm in depth there is no drainage no surrounding erythema. She is completing the Augmentin I gave her last week 04/27/18; surgical IandD site on the lower left abdominal  quadrant just above the symphysis pubis. Surface of this wound is improved dimensions are better. We've been using silver alginate and border foam. She tells me she developed C. difficile has been treated with oral vancomycin. Will need to be very judicious with any further antibiotics 05/04/18; surgical IandD site on the lower left abdomen just above the symphysis pubis. All of this is closed over except a small slit like area which is probably where the sinus tract was. There is no drainage no tenderness. 05/11/18; surgical IandD site on the left lower abdomen just above the symphysis pubis. All of this is closed. There is no open area here. No drainage and no tenderness she does have a history of abscesses but apparently none that have had difficulty healing like this one READMISSION 11/09/18 This is a patient we know from previous stays in this clinic. She apparently has a history of recurrent Cutaneous abscesses. We had her in the clinic last time with a lower abdomen/inguinal area abscess that required a surgical IandD. We managed to get this to close over. She also lives in fear of recurrent C. difficile and does not want systemic antibiotics. She tells [place] that she developed a small abscess in the wound area about 2-3 weeks ago. This spontaneously ruptured and had purulent material maintained. She is here for our review of the resultant wound 11/23/2018; interestingly the area of the opened for which we saw her 2 weeks ago has closed. This was likely an abscess  given the clinical description although we did not really see it at the beginning. Over the timeframe since we have seen here she developed a second area just underneath the original one from last week. As her friend describes this it is a purple papule that is painful, develops purulence ruptures and then heals. Right now it is a flat open area. 12/14/2018; the patient has no open wound. By description these are said to be abscess these  perform rupture and then formal wound. This is a second time we have had her for this. Readmission: 02/23/2020 patient presents for readmission in the clinic here today concerning issues that she has been having with a right gluteal fold abscess. She was actually seen in urgent care where they actually did perform an incision and drainage. They also did place her on antibiotics as well. She is currently on doxycycline. With that being said the good news is this does not appear to be significantly infected although there is necrotic tissue I think we need to work on getting this cleaned out but nonetheless I do not see any signs of active local or systemic infection. She does have a history of diabetes mellitus type 2. 03/01/2020 on evaluation today patient appears to be doing better with regard to her wound. She is measuring much smaller compared to prior evaluation. Fortunately there is no signs of active infection at this time. No fevers, chills, nausea, vomiting, or diarrhea. EISHA, CHATTERJEE ([id number]) 03/08/2020 upon evaluation today patient appears to be doing well with regard to her wound in my opinion. She is showing some signs of improvement here which is excellent news. There is no evidence of active infection at this time. No fevers, chills, nausea, vomiting, or diarrhea. Overall I am extremely pleased with the progress that she is making. She did have an abscess area on the opposite side in the similar location that she notes popped up in the past couple of days. I did look at that as well that had a bandage on it there was really no significant drainage and did not appear to be thing actually open I believe this may be more of a hair bump type issue. With that being said I do think that she may have frequent folliculitis/abscesses that need to be addressed. I think that we may have some recommendations for her here. Objective Constitutional Well-nourished and well-hydrated in no acute  distress. Vitals Time Taken: 9:36 AM, Height: 65 in, Weight: 293 lbs, BMI: 48.8, Temperature: 98.2 F, Pulse: 66 bpm, Respiratory Rate: 12 breaths/min, Blood Pressure: 141/83 mmHg. Respiratory normal breathing without difficulty. Psychiatric this patient is able to make decisions and demonstrates good insight into disease process. Alert and Oriented x 3. pleasant and cooperative. General Notes: Patient's wound bed currently showed signs of good granulation at this time. Fortunately there is no evidence of active infection which is great news. No fevers, chills, nausea, vomiting, or diarrhea. Integumentary (Hair, Skin) Wound #4 status is Open. Original cause of wound was Gradually Appeared. The wound is located on the Right Gluteal fold. The wound measures 0.8cm length x 3.4cm width x 1cm depth; 2.136cm^2 area and 2.136cm^3 volume. There is Fat Layer (Subcutaneous Tissue) Exposed exposed. There is a medium amount of serous drainage noted. The wound margin is well defined and not attached to the wound base. There is small (1-33%) pink granulation within the wound bed. There is a large (67-100%) amount of necrotic tissue within the wound bed including Adherent Slough. Assessment  Active Problems ICD-10 Cutaneous abscess of buttock Type 2 diabetes mellitus with other skin ulcer Non-pressure chronic ulcer of buttock with fat layer exposed Plan Wound Cleansing: Wound #4 Right Gluteal fold: Cleanse wound with mild soap and water Anesthetic (add to Medication List): Wound #4 Right Gluteal fold: Topical Lidocaine 4% cream applied to wound bed prior to debridement (In Clinic Only). Primary Wound Dressing: Wound #4 Right Gluteal fold: Silver Alginate Secondary Dressing: Debra BowieKANE, Anhelica A. ([id number]) Wound #4 Right Gluteal fold: Boardered Foam Dressing - cover area on the left for protection too Dressing Change Frequency: Wound #4 Right Gluteal fold: Change dressing every day. Follow-up  Appointments: Wound #4 Right Gluteal fold: Return Appointment in 1 week. Off-Loading: Wound #4 Right Gluteal fold: Turn and reposition every 2 hours Additional Orders / Instructions: Wound #4 Right Gluteal fold: Increase protein intake. 1. My suggestion at this time is good to be that we continue with the silver alginate for the wound at this point. I do believe that is doing a great job as far as getting to fill-in this measuring smaller, looking better, and I feel like she is making good progress. 2. With regard to covering this I think a border foam dressing to cover still appropriate. 3. With regard to the left gluteal region there was appearance of an abscess that may have been there this appears to have resolved fairly rapidly. Nonetheless apparently she has these frequently. For that reason I would recommend daily washes with Hibiclens for 1 week and then subsequently perform this weekly thereafter for at least 6 weeks hopefully cut back on the number of bacteria on the skin and subsequently bacteria that can cause abscesses and folliculitis. We will see patient back for reevaluation in 1 week here in the clinic. If anything worsens or changes patient will contact our office for additional recommendations. Electronic Signature(s) Signed: 03/08/2020 10:11:23 AM By: Lenda KelpStone III, Bailen Geffre PA-C Entered By: Lenda KelpStone III, Timea Breed on 03/08/2020 10:11:22 Debra BowieKANE, Demani A. ([id number]) -------------------------------------------------------------------------------- SuperBill Details Patient Name: Debra BowieKANE, Rockelle A. Date of Service: 03/08/2020 Medical Record Number: [id number] Patient Account Number: [id number] Date of Birth/Sex: Jun 24, 1961 (59 y.o. F) Treating RN: Curtis Sitesorthy, Joanna Primary Care Provider: Danelle BerryAPIA, LEISA Other Clinician: Referring Provider: Danelle BerryAPIA, LEISA Treating Provider/Extender: Linwood DibblesSTONE III, Emalynn Clewis Weeks in Treatment: 2 Diagnosis Coding ICD-10 Codes Code Description L02.31 Cutaneous abscess of  buttock E11.622 Type 2 diabetes mellitus with other skin ulcer L98.412 Non-pressure chronic ulcer of buttock with fat layer exposed Facility Procedures CPT4 Code: [id number] Description: 99213 - WOUND CARE VISIT-LEV 3 EST PT Modifier: Quantity: 1 Physician Procedures CPT4 Code: [id number] Description: 99213 - WC PHYS LEVEL 3 - EST PT Modifier: Quantity: 1 CPT4 Code: Description: ICD-10 Diagnosis Description L02.31 Cutaneous abscess of buttock E11.622 Type 2 diabetes mellitus with other skin ulcer L98.412 Non-pressure chronic ulcer of buttock with fat layer exposed Modifier: Quantity: Electronic Signature(s) Signed: 03/08/2020 10:18:42 AM By: Curtis Sitesorthy, Joanna Signed: 03/11/2020 9:03:29 AM By: Lenda KelpStone III, Breely Panik PA-C Previous Signature: 03/08/2020 10:11:41 AM Version By: Lenda KelpStone III, Makynlee Kressin PA-C Entered By: Curtis Sitesorthy, Joanna on 03/08/2020 10:18:42

## 2020-03-11 NOTE — Progress Notes (Signed)
RAILYNN, BALLO ([id number]) Visit Report for 03/08/2020 Arrival Information Details Patient Name: Debra [name], Debra [name]. Date of Service: 03/08/2020 9:30 AM Medical Record Number: [id number] Patient Account Number: [id number] Date of Birth/Sex: 1961-07-16 (59 y.o. F) Treating RN: Cornell Barman Primary Care Seretha Estabrooks: Delsa Grana Other Clinician: Referring Kadia Abaya: Delsa Grana Treating Alita Waldren/Extender: Melburn Hake, HOYT Weeks in Treatment: 2 Visit Information History Since Last Visit Added or deleted any medications: No Patient Arrived: Ambulatory Any new allergies or adverse reactions: No Arrival Time: 09:35 Had a fall or experienced change in No Accompanied By: friend activities of daily living that may affect Transfer Assistance: None risk of falls: Patient Identification Verified: Yes Signs or symptoms of abuse/neglect since last visito No Secondary Verification Process Completed: Yes Hospitalized since last visit: No Patient Requires Transmission-Based Precautions: No Implantable device outside of the clinic excluding No Patient Has Alerts: No cellular tissue based products placed in the center since last visit: Has Dressing in Place as Prescribed: Yes Pain Present Now: No Electronic Signature(s) Signed: 03/11/2020 11:52:59 AM By: Gretta Cool, BSN, RN, CWS, Decklyn RN, BSN Entered By: Gretta Cool, BSN, RN, CWS, Skylar on 03/08/2020 09:36:05 Debra [name] ([id number]) -------------------------------------------------------------------------------- Clinic Level of Care Assessment Details Patient Name: Debra [name]. Date of Service: 03/08/2020 9:30 AM Medical Record Number: [id number] Patient Account Number: [id number] Date of Birth/Sex: [birth year] (59 y.o. F) Treating RN: Montey Hora Primary Care Georganna Maxson: Delsa Grana Other Clinician: Referring Tabby Beaston: Delsa Grana Treating Ripken Rekowski/Extender: Melburn Hake, HOYT Weeks in Treatment: 2 Clinic Level of Care Assessment Items TOOL 4 Quantity Score []  - Use when  only an EandM is performed on FOLLOW-UP visit 0 ASSESSMENTS - Nursing Assessment / Reassessment X - Reassessment of Co-morbidities (includes updates in patient status) 1 10 X- 1 5 Reassessment of Adherence to Treatment Plan ASSESSMENTS - Wound and Skin Assessment / Reassessment X - Simple Wound Assessment / Reassessment - one wound 1 5 []  - 0 Complex Wound Assessment / Reassessment - multiple wounds []  - 0 Dermatologic / Skin Assessment (not related to wound area) ASSESSMENTS - Focused Assessment []  - Circumferential Edema Measurements - multi extremities 0 []  - 0 Nutritional Assessment / Counseling / Intervention []  - 0 Lower Extremity Assessment (monofilament, tuning fork, pulses) []  - 0 Peripheral Arterial Disease Assessment (using hand held doppler) ASSESSMENTS - Ostomy and/or Continence Assessment and Care []  - Incontinence Assessment and Management 0 []  - 0 Ostomy Care Assessment and Management (repouching, etc.) PROCESS - Coordination of Care X - Simple Patient / Family Education for ongoing care 1 15 []  - 0 Complex (extensive) Patient / Family Education for ongoing care X- 1 10 Staff obtains Programmer, systems, Records, Test Results / Process Orders []  - 0 Staff telephones HHA, Nursing Homes / Clarify orders / etc []  - 0 Routine Transfer to another Facility (non-emergent condition) []  - 0 Routine Hospital Admission (non-emergent condition) []  - 0 New Admissions / Biomedical engineer / Ordering NPWT, Apligraf, etc. []  - 0 Emergency Hospital Admission (emergent condition) X- 1 10 Simple Discharge Coordination []  - 0 Complex (extensive) Discharge Coordination PROCESS - Special Needs []  - Pediatric / Minor Patient Management 0 []  - 0 Isolation Patient Management []  - 0 Hearing / Language / Visual special needs []  - 0 Assessment of Community assistance (transportation, D/C planning, etc.) Debra [name], Debra [name] ([id number]) []  - 0 Additional assistance / Altered mentation []   - 0 Support Surface(s) Assessment (bed, cushion, seat, etc.) INTERVENTIONS - Wound Cleansing / Measurement X - Simple Wound Cleansing - one  wound 1 5 []  - 0 Complex Wound Cleansing - multiple wounds X- 1 5 Wound Imaging (photographs - any number of wounds) []  - 0 Wound Tracing (instead of photographs) X- 1 5 Simple Wound Measurement - one wound []  - 0 Complex Wound Measurement - multiple wounds INTERVENTIONS - Wound Dressings X - Small Wound Dressing one or multiple wounds 1 10 []  - 0 Medium Wound Dressing one or multiple wounds []  - 0 Large Wound Dressing one or multiple wounds []  - 0 Application of Medications - topical []  - 0 Application of Medications - injection INTERVENTIONS - Miscellaneous []  - External ear exam 0 []  - 0 Specimen Collection (cultures, biopsies, blood, body fluids, etc.) []  - 0 Specimen(s) / Culture(s) sent or taken to Lab for analysis []  - 0 Patient Transfer (multiple staff / / Similar devices) []  - 0 Simple Staple / Suture removal (25 or less) []  - 0 Complex Staple / Suture removal (26 or more) []  - 0 Hypo / Hyperglycemic Management (close monitor of Blood Glucose) []  - 0 Ankle / Brachial Index (ABI) - do not check if billed separately X- 1 5 Vital Signs Has the patient been seen at the hospital within the last three years: Yes Total Score: 85 Level Of Care: New/Established - Level 3 Electronic Signature(s) Signed: 03/08/2020 12:28:55 PM By: Entered By: on 03/08/2020 10:18:18 ( ) -------------------------------------------------------------------------------- Encounter Discharge Information Details Patient Name: . Date of Service: 03/08/2020 9:30 AM Medical Record Number: Patient Account Number: Date of Birth/Sex: Nov 02, 1961 (59 y.o. F) Treating RN: Primary Care Julliette Frentz: Other Clinician: Referring Devra Stare: Treating Leianne Callins/Extender: 05/08/2020, HOYT Weeks in Treatment: 2 Encounter Discharge Information Items Discharge Condition: Stable Ambulatory Status: Ambulatory Discharge Destination: Home Transportation: Private Auto Accompanied By: caregiver Schedule Follow-up Appointment: Yes Clinical Summary of Care: Electronic Signature(s) Signed: 03/08/2020 10:20:00 AM By: Curtis Sites Entered By: 05/08/2020 on 03/08/2020 10:20:00 [id number] (Debra [name]) -------------------------------------------------------------------------------- Lower Extremity Assessment Details Patient Name: 05/08/2020 Date of Service: 03/08/2020 9:30 AM Medical Record Number: [id number] Patient Account Number: 11/05/1961 Date of Birth/Sex: 08-22-1961 (59 y.o. F) Treating RN: Danelle Berry Primary Care Jamaree Hosier: Danelle Berry Other Clinician: Referring Bob Eastwood: Linwood Dibbles Treating Gianlucas Evenson/Extender: 05/08/2020, HOYT Weeks in Treatment: 2 Edema Assessment Assessed: [Left: No] [Right: No] Edema: [Left: N] [Right: o] Vascular Assessment Pulses: Dorsalis Pedis Palpable: [Right:Yes] Electronic Signature(s) Signed: 03/11/2020 11:52:59 AM By: Curtis Sites, BSN, RN, CWS, Eliane RN, BSN Entered By: 05/08/2020, BSN, RN, CWS, Neria on 03/08/2020 09:49:03 [id number] (Debra [name]) -------------------------------------------------------------------------------- Multi Wound Chart Details Patient Name: 05/08/2020. Date of Service: 03/08/2020 9:30 AM Medical Record Number: [id number] Patient Account Number: 11/05/1961 Date of Birth/Sex: 03/17/1961 (59 y.o. F) Treating RN: Danelle Berry Primary Care Jerime Arif: Danelle Berry Other Clinician: Referring Riki Berninger: Linwood Dibbles Treating William Schake/Extender: 05/11/2020, HOYT Weeks in Treatment: 2 Vital Signs Height(in): 65 Pulse(bpm): 66 Weight(lbs): 293 Blood Pressure(mmHg): 141/83 Body Mass Index(BMI): 49 Temperature(F): 98.2 Respiratory Rate(breaths/min): 12 Photos: [N/A:N/A] Wound  Location: Right Gluteal fold N/A N/A Wounding Event: Gradually Appeared N/A N/A Primary Etiology: Abscess N/A N/A Comorbid History: Hypertension, Type II Diabetes, N/A N/A Osteoarthritis Date Acquired: 02/16/2020 N/A N/A Weeks of Treatment: 2 N/A N/A Wound Status: Open N/A N/A Measurements L x W x D (cm) 0.8x3.4x1 N/A N/A Area (cm) : 2.136 N/A N/A Volume (cm) : 2.136 N/A N/A % Reduction in Area: 9.30% N/A N/A %  Reduction in Volume: 24.40% N/A N/A Classification: Full Thickness Without Exposed N/A N/A Support Structures Exudate Amount: Medium N/A N/A Exudate Type: Serous N/A N/A Exudate Color: amber N/A N/A Wound Margin: Well defined, not attached N/A N/A Granulation Amount: Small (1-33%) N/A N/A Granulation Quality: Pink N/A N/A Necrotic Amount: Large (67-100%) N/A N/A Exposed Structures: Fat Layer (Subcutaneous Tissue) N/A N/A Exposed: Yes Fascia: No Tendon: No Muscle: No Joint: No Bone: No Epithelialization: None N/A N/A Treatment Notes Electronic Signature(s) Signed: 03/08/2020 12:28:55 PM By: Curtis Sites Entered By: Curtis Sites on 03/08/2020 09:56:01 Debra [name] ([id number] [name], Rosana Hoes ([id number]) -------------------------------------------------------------------------------- Multi-Disciplinary Care Plan Details Patient Name: Debra [name]. Date of Service: 03/08/2020 9:30 AM Medical Record Number: [id number] Patient Account Number: [id number] Date of Birth/Sex: 29-Dec-1960 (59 y.o. F) Treating RN: Curtis Sites Primary Care Belford Pascucci: Danelle Berry Other Clinician: Referring Maram Bently: Danelle Berry Treating Kolbie Lepkowski/Extender: Linwood Dibbles, HOYT Weeks in Treatment: 2 Active Inactive Necrotic Tissue Nursing Diagnoses: Impaired tissue integrity related to necrotic/devitalized tissue Goals: Necrotic/devitalized tissue will be minimized in the wound bed Date Initiated: 02/23/2020 Target Resolution Date: 03/06/2020 Goal Status: Active Interventions: Assess patient  pain level pre-, during and post procedure and prior to discharge Treatment Activities: Excisional debridement : 02/23/2020 Notes: Orientation to the Wound Care Program Nursing Diagnoses: Knowledge deficit related to the wound healing center program Goals: Patient/caregiver will verbalize understanding of the Wound Healing Center Program Date Initiated: 02/23/2020 Target Resolution Date: 03/06/2020 Goal Status: Active Interventions: Provide education on orientation to the wound center Notes: Soft Tissue Infection Nursing Diagnoses: Impaired tissue integrity Knowledge deficit related to home infection control: handwashing, handling of soiled dressings, supply storage Goals: Patient will remain free of wound infection Date Initiated: 02/23/2020 Target Resolution Date: 03/06/2020 Goal Status: Active Interventions: Assess signs and symptoms of infection every visit Notes: Wound/Skin Impairment Debra [name], Debra [name] ([id number]) Nursing Diagnoses: Impaired tissue integrity Goals: Ulcer/skin breakdown will have a volume reduction of 30% by week 4 Date Initiated: 02/23/2020 Target Resolution Date: 03/25/2020 Goal Status: Active Interventions: Assess patient/caregiver ability to obtain necessary supplies Treatment Activities: Skin care regimen initiated : 02/23/2020 Topical wound management initiated : 02/23/2020 Notes: Electronic Signature(s) Signed: 03/08/2020 12:28:55 PM By: Curtis Sites Entered By: Curtis Sites on 03/08/2020 09:53:42 Debra [name] ([id number]) -------------------------------------------------------------------------------- Pain Assessment Details Patient Name: Debra [name]. Date of Service: 03/08/2020 9:30 AM Medical Record Number: [id number] Patient Account Number: [id number] Date of Birth/Sex: 03/25/1961 (59 y.o. F) Treating RN: Huel Coventry Primary Care Jalien Weakland: Danelle Berry Other Clinician: Referring Caydence Enck: Danelle Berry Treating Panayiota Larkin/Extender: Linwood Dibbles,  HOYT Weeks in Treatment: 2 Active Problems Location of Pain Severity and Description of Pain Patient Has Paino No Site Locations Pain Management and Medication Current Pain Management: Electronic Signature(s) Signed: 03/11/2020 11:52:59 AM By: Elliot Gurney, BSN, RN, CWS, Kenyette RN, BSN Entered By: Elliot Gurney, BSN, RN, CWS, Nycole on 03/08/2020 09:37:15 Debra [name] ([id number]) -------------------------------------------------------------------------------- Patient/Caregiver Education Details Patient Name: Debra [name]. Date of Service: 03/08/2020 9:30 AM Medical Record Number: [id number] Patient Account Number: [id number] Date of Birth/Gender: [birth year] (59 y.o. F) Treating RN: Curtis Sites Primary Care Physician: Danelle Berry Other Clinician: Referring Physician: Danelle Berry Treating Physician/Extender: Skeet Simmer in Treatment: 2 Education Assessment Education Provided To: Patient and Caregiver Education Topics Provided Basic Hygiene: Handouts: Other: cleanse with hibiclens Methods: Explain/Verbal Responses: State content correctly Electronic Signature(s) Signed: 03/08/2020 12:28:55 PM By: Curtis Sites Entered By: Curtis Sites on 03/08/2020 10:19:05 Debra [name] ([id number]) -------------------------------------------------------------------------------- Wound Assessment Details Patient Name:  Debra [name], Debra A. Date of Service: 03/08/2020 9:30 AM Medical Record Number: [id number] Patient Account Number: [id number] Date of Birth/Sex: 04/14/1961 (59 y.o. F) Treating RN: Huel Coventry Primary Care Oline Belk: Danelle Berry Other Clinician: Referring Reeve Turnley: Danelle Berry Treating Zosia Lucchese/Extender: Linwood Dibbles, HOYT Weeks in Treatment: 2 Wound Status Wound Number: 4 Primary Etiology: Abscess Wound Location: Right Gluteal fold Wound Status: Open Wounding Event: Gradually Appeared Comorbid History: Hypertension, Type II Diabetes, Osteoarthritis Date Acquired: 02/16/2020 Weeks Of Treatment:  2 Clustered Wound: No Photos Wound Measurements Length: (cm) 0.8 Width: (cm) 3.4 Depth: (cm) 1 Area: (cm) 2.136 Volume: (cm) 2.136 % Reduction in Area: 9.3% % Reduction in Volume: 24.4% Epithelialization: None Wound Description Classification: Full Thickness Without Exposed Support Structu Wound Margin: Well defined, not attached Exudate Amount: Medium Exudate Type: Serous Exudate Color: amber res Foul Odor After Cleansing: No Slough/Fibrino Yes Wound Bed Granulation Amount: Small (1-33%) Exposed Structure Granulation Quality: Pink Fascia Exposed: No Necrotic Amount: Large (67-100%) Fat Layer (Subcutaneous Tissue) Exposed: Yes Necrotic Quality: Adherent Slough Tendon Exposed: No Muscle Exposed: No Joint Exposed: No Bone Exposed: No Treatment Notes Wound #4 (Right Gluteal fold) Notes silver cell packed into wound, brdered foam dressing Electronic Signature(s) Signed: 03/11/2020 11:52:59 AM By: Elliot Gurney, BSN, RN, CWS, Kimberely RN, BSN Debra [name] ([id number]) Entered By: Elliot Gurney, BSN, RN, CWS, Babbie on 03/08/2020 09:47:49 Debra [name] ([id number]) -------------------------------------------------------------------------------- Vitals Details Patient Name: Debra [name]. Date of Service: 03/08/2020 9:30 AM Medical Record Number: [id number] Patient Account Number: [id number] Date of Birth/Sex: May 08, 1961 (59 y.o. F) Treating RN: Huel Coventry Primary Care Saidah Kempton: Danelle Berry Other Clinician: Referring Tammela Bales: Danelle Berry Treating Dakoda Bassette/Extender: Linwood Dibbles, HOYT Weeks in Treatment: 2 Vital Signs Time Taken: 09:36 Temperature (F): 98.2 Height (in): 65 Pulse (bpm): 66 Weight (lbs): 293 Respiratory Rate (breaths/min): 12 Body Mass Index (BMI): 48.8 Blood Pressure (mmHg): 141/83 Reference Range: 80 - 120 mg / dl Electronic Signature(s) Signed: 03/11/2020 11:52:59 AM By: Elliot Gurney, BSN, RN, CWS, Parisa RN, BSN Entered By: Elliot Gurney, BSN, RN, CWS, Anysia on 03/08/2020 09:37:08

## 2020-03-13 ENCOUNTER — Other Ambulatory Visit: Payer: Self-pay | Admitting: Family Medicine

## 2020-03-13 DIAGNOSIS — M79605 Pain in left leg: Secondary | ICD-10-CM

## 2020-03-13 DIAGNOSIS — R208 Other disturbances of skin sensation: Secondary | ICD-10-CM

## 2020-03-13 DIAGNOSIS — M79604 Pain in right leg: Secondary | ICD-10-CM

## 2020-03-13 NOTE — Telephone Encounter (Signed)
Requested medication (s) are due for refill today: No  Requested medication (s) are on the active medication list:yes  Last refill: 02/16/20 #60  3 refills  Future visit scheduled: yes  Notes to clinic:  Not delegated Patient is requesting 90 day supply (3 month supply)    Requested Prescriptions  Pending Prescriptions Disp Refills   pregabalin (LYRICA) 25 MG capsule 60 capsule 3    Sig: Take 1 capsule (25 mg total) by mouth 2 (two) times daily.      Not Delegated - Neurology:  Anticonvulsants - Controlled Failed - 03/13/2020  4:44 PM      Failed - This refill cannot be delegated      Passed - Valid encounter within last 12 months    Recent Outpatient Visits           3 weeks ago Subacute vaginitis   [facility] Danelle Berry, PA-C   3 months ago Close exposure to COVID-19 virus   [facility] Danelle Berry, PA-C   5 months ago Uncontrolled diabetes mellitus type 2 with atherosclerosis of arteries of extremities [facility])   [facility] Danelle Berry, PA-C   6 months ago Uncontrolled diabetes mellitus type 2 with atherosclerosis of arteries of extremities [facility])   [facility] Doren Custard, FNP   9 months ago Abdominal aortic atherosclerosis [facility])   [facility] P[place], Percell Belt, NP       Future Appointments             In 3 months [facility], [facility]

## 2020-03-13 NOTE — Telephone Encounter (Signed)
Medication Refill - Medication: lyrica   Has the patient contacted their pharmacy? Yes.   Pt is requesting a 3 month supply.  (Agent: If no, request that the patient contact the pharmacy for the refill.) (Agent: If yes, when and what did the pharmacy advise?)  Preferred Pharmacy (with phone number or street name):  [phone] [address]im [address]  [address] [place] [address]  Phone: [phone] Fax: [phone]  Not a 24 hour pharmacy; exact hours not known.     Agent: Please be advised that RX refills may take up to 3 business days. We ask that you follow-up with your pharmacy.

## 2020-03-14 ENCOUNTER — Other Ambulatory Visit: Payer: Self-pay

## 2020-03-14 ENCOUNTER — Encounter: Payer: Managed Care, Other (non HMO) | Admitting: Physician Assistant

## 2020-03-14 DIAGNOSIS — L0231 Cutaneous abscess of buttock: Secondary | ICD-10-CM | POA: Diagnosis not present

## 2020-03-14 MED ORDER — PREGABALIN 25 MG PO CAPS: 25.0000 mg | ORAL_CAPSULE | Freq: Two times a day (BID) | ORAL | 3 refills | Status: DC

## 2020-03-15 NOTE — Progress Notes (Addendum)
Debra [name] ([id number]) Visit Report for 03/14/2020 Chief Complaint Document Details Patient Name: Debra [name]. Date of Service: 03/14/2020 3:30 PM Medical Record Number: [id number] Patient Account Number: [id number] Date of Birth/Sex: 11/23/1961 (59 y.o. F) Treating RN: Army Melia Primary Care Provider: Delsa Grana Other Clinician: Referring Provider: Delsa Grana Treating Provider/Extender: Melburn Hake, Zalyn Amend Weeks in Treatment: 2 Information Obtained from: Patient Chief Complaint Right gluteal fold abscess Electronic Signature(s) Signed: 03/14/2020 3:49:28 PM By: Worthy Keeler PA-C Entered By: Worthy Keeler on 03/14/2020 15:49:28 Debra [name] ([id number]) -------------------------------------------------------------------------------- HPI Details Patient Name: Debra [name] Date of Service: 03/14/2020 3:30 PM Medical Record Number: [id number] Patient Account Number: [id number] Date of Birth/Sex: 06-15-1961 (59 y.o. 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 She tells me everything was fine up until a week ago. She noticed pain in her left groin area. By Saturday this it started to drain. She applied black salve to this area which she has done in the past and is helped heal problems in this area. This did not work. She was seen in the ER on 03/27/18 with an abscess in the left groin. Noted to have surrounding cellulitis and an indurated area. She underwent an IandD. She was started on patient clindamycin. Her white count was 11.9. Comprehensive metabolic panel normal lactic acid level normal at 1.2. They did not do a culture that I can see. She was seen yesterday at her primary physician's office  she may have been put on cephalexin at that point. She is using iodoform packing The patient is a type II diabetic with a history of PAD last hemoglobin A1c of 7.3. She has a history of parkinsonism, COPD, bipolar disorder hypertension. She is a continued 1 pack per day smoker She tells me that she does have a history of recurrent abscesses in this area last about a year ago. Mostly she seems to care for these herself and they seem to go away. 04/06/18; patient's surgical IandD on the lower left abdominal quadrant just above the symphysis pubis appears better. Following is better we've been using silver alginate. Culture I did last week did not grow MRSA rather Enterococcus faecalis. I substituted Augmentin for the clindamycin however the patient didn't have enough money, however she will pick it up this morning 04/13/18; surgical IandD on the lower left abdominal quadrant just above the symphysis pubis. Surface of this area looks healthy. This is an oval-shaped wound lying horizontally. Medially it has A, all with about 0.9 cm in depth there is no drainage no surrounding erythema. She is completing the Augmentin I gave her last week 04/27/18; surgical IandD site on the lower left abdominal quadrant just above the symphysis pubis. Surface of this wound is improved dimensions are better. We've been using silver alginate and border foam. She tells me she developed C. difficile has been treated with oral vancomycin. Will need to be very judicious with any further antibiotics 05/04/18; surgical IandD site on the lower left abdomen just above the symphysis pubis. All of this is closed over except a small slit like area which is probably where the sinus tract was. There is no drainage no tenderness. 05/11/18; surgical IandD site on the left lower abdomen just above the  symphysis pubis. All of this is closed. There is no open area here. No drainage and no tenderness she does have a history of abscesses but  apparently none that have had difficulty healing like this one READMISSION 11/09/18 This is a patient we know from previous stays in this clinic. She apparently has a history of recurrent Cutaneous abscesses. We had her in the clinic last time with a lower abdomen/inguinal area abscess that required a surgical IandD. We managed to get this to close over. She also lives in fear of recurrent C. difficile and does not want systemic antibiotics. She tells [place] that she developed a small abscess in the wound area about 2-3 weeks ago. This spontaneously ruptured and had purulent material maintained. She is here for our review of the resultant wound 11/23/2018; interestingly the area of the opened for which we saw her 2 weeks ago has closed. This was likely an abscess given the clinical description although we did not really see it at the beginning. Over the timeframe since we have seen here she developed a second area just underneath the original one from last week. As her friend describes this it is a purple papule that is painful, develops purulence ruptures and then heals. Right now it is a flat open area. 12/14/2018; the patient has no open wound. By description these are said to be abscess these perform rupture and then formal wound. This is a second time we have had her for this. Readmission: 02/23/2020 patient presents for readmission in the clinic here today concerning issues that she has been having with a right gluteal fold abscess. She was actually seen in urgent care where they actually did perform an incision and drainage. They also did place her on antibiotics as well. She is currently on doxycycline. With that being said the good news is this does not appear to be significantly infected although there is necrotic tissue I think we need to work on getting this cleaned out but nonetheless I do not see any signs of active local or systemic infection. She does have a history of diabetes mellitus  type 2. 03/01/2020 on evaluation today patient appears to be doing better with regard to her wound. She is measuring much smaller compared to prior evaluation. Fortunately there is no signs of active infection at this time. No fevers, chills, nausea, vomiting, or diarrhea. 03/08/2020 upon evaluation today patient appears to be doing well with regard to her wound in my opinion. She is showing some signs of improvement here which is excellent news. There is no evidence of active infection at this time. No fevers, chills, nausea, vomiting, or diarrhea. Overall I am extremely pleased with the progress that she is making. She did have an abscess area on the opposite side in the similar location that she notes popped up in the past couple of days. I did look at that as well that had a bandage on it there was really no significant drainage and did not appear to be thing actually open I believe this may be more of a hair bump type issue. With that being said I do think that she may have frequent Ausborn, Tria A. ([id number]) folliculitis/abscesses that need to be addressed. I think that we may have some recommendations for her here. 03/14/20 upon evaluation today patient appears to be doing very well with regard to her wound. She has been tolerating the dressing changes without complication. Fortunately there is no signs of active infection at this time.  Overall I feel like this is progressing quite nicely. Electronic Signature(s) Signed: 03/14/2020 5:46:13 PM By: Lenda Kelp PA-C Entered By: Lenda Kelp on 03/14/2020 17:46:13 Debra [name] ([id number]) -------------------------------------------------------------------------------- Physical Exam Details Patient Name: Debra [name] Date of Service: 03/14/2020 3:30 PM Medical Record Number: [id number] Patient Account Number: [id number] Date of Birth/Sex: [birth year] (59 y.o. F) Treating RN: Rodell Perna Primary Care Provider: Danelle Berry Other  Clinician: Referring Provider: Danelle Berry Treating Provider/Extender: STONE III, Margie Brink Weeks in Treatment: 2 Constitutional Obese and well-hydrated in no acute distress. Respiratory normal breathing without difficulty. Psychiatric this patient is able to make decisions and demonstrates good insight into disease process. Alert and Oriented x 3. pleasant and cooperative. Notes Patient's wound bed currently showed signs of excellent epithelization around the edges the wound is also granulating in quite nicely. Fortunately there is no signs of infection and overall I do believe the patient is tolerating the alginate quite well I would recommend that we continue as such. Electronic Signature(s) Signed: 03/14/2020 5:46:28 PM By: Lenda Kelp PA-C Entered By: Lenda Kelp on 03/14/2020 17:46:28 Debra [name] ([id number]) -------------------------------------------------------------------------------- Physician Orders Details Patient Name: Debra [name] Date of Service: 03/14/2020 3:30 PM Medical Record Number: [id number] Patient Account Number: [id number] Date of Birth/Sex: 29-Apr-1961 (59 y.o. F) Treating RN: Rodell Perna Primary Care Provider: Danelle Berry Other Clinician: Referring Provider: Danelle Berry Treating Provider/Extender: Linwood Dibbles, Everlynn Sagun Weeks in Treatment: 2 Verbal / Phone Orders: No Diagnosis Coding ICD-10 Coding Code Description L02.31 Cutaneous abscess of buttock E11.622 Type 2 diabetes mellitus with other skin ulcer L98.412 Non-pressure chronic ulcer of buttock with fat layer exposed Wound Cleansing Wound #4 Right Gluteal fold o Cleanse wound with mild soap and water o Other: - Hibiclens soap (generic brand is fine) - wash head to toe every day for 1 week then once weekly for 6 weeks Anesthetic (add to Medication List) Wound #4 Right Gluteal fold o Topical Lidocaine 4% cream applied to wound bed prior to debridement (In Clinic Only). Primary Wound  Dressing Wound #4 Right Gluteal fold o Silver Alginate Secondary Dressing Wound #4 Right Gluteal fold o Boardered Foam Dressing - cover area on the left for protection too Dressing Change Frequency Wound #4 Right Gluteal fold o Change dressing every day. Follow-up Appointments Wound #4 Right Gluteal fold o Return Appointment in 1 week. Off-Loading Wound #4 Right Gluteal fold o Turn and reposition every 2 hours Additional Orders / Instructions Wound #4 Right Gluteal fold o Increase protein intake. Electronic Signature(s) Signed: 03/14/2020 4:14:40 PM By: Rodell Perna Signed: 03/15/2020 10:31:16 AM By: Lenda Kelp PA-C Entered By: Rodell Perna on 03/14/2020 15:51:25 Debra [name] ([id number]) -------------------------------------------------------------------------------- Problem List Details Patient Name: Debra [name] Date of Service: 03/14/2020 3:30 PM Medical Record Number: [id number] Patient Account Number: [id number] Date of Birth/Sex: [birth year] (59 y.o. F) Treating RN: Rodell Perna Primary Care Provider: Danelle Berry Other Clinician: Referring Provider: Danelle Berry Treating Provider/Extender: Linwood Dibbles, Simmone Cape Weeks in Treatment: 2 Active Problems ICD-10 Evaluated Encounter Code Description Active Date Today Diagnosis L02.31 Cutaneous abscess of buttock 02/23/2020 No Yes E11.622 Type 2 diabetes mellitus with other skin ulcer 02/23/2020 No Yes L98.412 Non-pressure chronic ulcer of buttock with fat layer exposed 02/23/2020 No Yes Inactive Problems Resolved Problems Electronic Signature(s) Signed: 03/14/2020 3:49:23 PM By: Lenda Kelp PA-C Entered By: Lenda Kelp on 03/14/2020 15:49:22 Debra [name] ([id number]) -------------------------------------------------------------------------------- Progress Note Details Patient Name: Debra [name] Date of Service: 03/14/2020  3:30 PM Medical Record Number: [id number] Patient Account Number: [id number] Date of  Birth/Sex: 15-Dec-1960 (59 y.o. 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 She tells me everything was fine up until a week ago. She noticed pain in her left groin area. By Saturday this it started to drain. She applied black salve to this area which she has done in the past and is helped heal problems in this area. This did not work. She was seen in the ER on 03/27/18 with an abscess in the left groin. Noted to have surrounding cellulitis and an indurated area. She underwent an IandD. She was started on patient clindamycin. Her white count was 11.9. Comprehensive metabolic panel normal lactic acid level normal at 1.2. They did not do a culture that I can see. She was seen yesterday at her primary physician's office she may have been put on cephalexin at that point. She is using iodoform packing The patient is a type II diabetic with a history of PAD last hemoglobin A1c of 7.3. She has a history of parkinsonism, COPD, bipolar disorder hypertension. She is a continued 1 pack per day smoker She tells me that she does have a history of recurrent abscesses in this area last about a year ago. Mostly she seems to care for these herself and they seem to go away. 04/06/18; patient's surgical IandD on the lower left abdominal quadrant just above the symphysis pubis appears better. Following is better we've been using silver alginate. Culture I did last week did not grow MRSA rather Enterococcus faecalis. I substituted Augmentin for the clindamycin however the patient didn't have enough money, however she will pick it up this morning 04/13/18;  surgical IandD on the lower left abdominal quadrant just above the symphysis pubis. Surface of this area looks healthy. This is an oval-shaped wound lying horizontally. Medially it has A, all with about 0.9 cm in depth there is no drainage no surrounding erythema. She is completing the Augmentin I gave her last week 04/27/18; surgical IandD site on the lower left abdominal quadrant just above the symphysis pubis. Surface of this wound is improved dimensions are better. We've been using silver alginate and border foam. She tells me she developed C. difficile has been treated with oral vancomycin. Will need to be very judicious with any further antibiotics 05/04/18; surgical IandD site on the lower left abdomen just above the symphysis pubis. All of this is closed over except a small slit like area which is probably where the sinus tract was. There is no drainage no tenderness. 05/11/18; surgical IandD site on the left lower abdomen just above the symphysis pubis. All of this is closed. There is no open area here. No drainage and no tenderness she does have a history of abscesses but apparently none that have had difficulty healing like this one READMISSION 11/09/18 This is a patient we know from previous stays in this clinic. She apparently has a history of recurrent Cutaneous abscesses. We had her in the clinic last time with a lower abdomen/inguinal area abscess that required a surgical IandD. We managed to get this to close over. She also lives in fear of recurrent C. difficile and  does not want systemic antibiotics. She tells [place] that she developed a small abscess in the wound area about 2-3 weeks ago. This spontaneously ruptured and had purulent material maintained. She is here for our review of the resultant wound 11/23/2018; interestingly the area of the opened for which we saw her 2 weeks ago has closed. This was likely an abscess given the clinical description although we did not really see it at  the beginning. Over the timeframe since we have seen here she developed a second area just underneath the original one from last week. As her friend describes this it is a purple papule that is painful, develops purulence ruptures and then heals. Right now it is a flat open area. 12/14/2018; the patient has no open wound. By description these are said to be abscess these perform rupture and then formal wound. This is a second time we have had her for this. Readmission: 02/23/2020 patient presents for readmission in the clinic here today concerning issues that she has been having with a right gluteal fold abscess. She was actually seen in urgent care where they actually did perform an incision and drainage. They also did place her on antibiotics as well. She is currently on doxycycline. With that being said the good news is this does not appear to be significantly infected although there is necrotic tissue I think we need to work on getting this cleaned out but nonetheless I do not see any signs of active local or systemic infection. She does have a history of diabetes mellitus type 2. 03/01/2020 on evaluation today patient appears to be doing better with regard to her wound. She is measuring much smaller compared to prior evaluation. Fortunately there is no signs of active infection at this time. No fevers, chills, nausea, vomiting, or diarrhea. Debra [name], Debra [name] ([id number]) 03/08/2020 upon evaluation today patient appears to be doing well with regard to her wound in my opinion. She is showing some signs of improvement here which is excellent news. There is no evidence of active infection at this time. No fevers, chills, nausea, vomiting, or diarrhea. Overall I am extremely pleased with the progress that she is making. She did have an abscess area on the opposite side in the similar location that she notes popped up in the past couple of days. I did look at that as well that had a bandage on it there was  really no significant drainage and did not appear to be thing actually open I believe this may be more of a hair bump type issue. With that being said I do think that she may have frequent folliculitis/abscesses that need to be addressed. I think that we may have some recommendations for her here. 03/14/20 upon evaluation today patient appears to be doing very well with regard to her wound. She has been tolerating the dressing changes without complication. Fortunately there is no signs of active infection at this time. Overall I feel like this is progressing quite nicely. Objective Constitutional Obese and well-hydrated in no acute distress. Vitals Time Taken: 3:20 PM, Height: 65 in, Weight: 293 lbs, BMI: 48.8, Temperature: 98.7 F, Pulse: 73 bpm, Respiratory Rate: 18 breaths/min, Blood Pressure: 121/56 mmHg. Respiratory normal breathing without difficulty. Psychiatric this patient is able to make decisions and demonstrates good insight into disease process. Alert and Oriented x 3. pleasant and cooperative. General Notes: Patient's wound bed currently showed signs of excellent epithelization around the edges the wound is also granulating in quite nicely. Fortunately  there is no signs of infection and overall I do believe the patient is tolerating the alginate quite well I would recommend that we continue as such. Integumentary (Hair, Skin) Wound #4 status is Open. Original cause of wound was Gradually Appeared. The wound is located on the Right Gluteal fold. The wound measures 0.5cm length x 2cm width x 0.5cm depth; 0.785cm^2 area and 0.393cm^3 volume. There is Fat Layer (Subcutaneous Tissue) Exposed exposed. There is no tunneling or undermining noted. There is a medium amount of serous drainage noted. The wound margin is well defined and not attached to the wound base. There is medium (34-66%) pink granulation within the wound bed. There is a medium (34-66%) amount of necrotic tissue within  the wound bed including Adherent Slough. Assessment Active Problems ICD-10 Cutaneous abscess of buttock Type 2 diabetes mellitus with other skin ulcer Non-pressure chronic ulcer of buttock with fat layer exposed Plan Wound Cleansing: Wound #4 Right Gluteal fold: Cleanse wound with mild soap and water Other: - Hibiclens soap (generic brand is fine) - wash head to toe every day for 1 week then once weekly for 6 weeks Anesthetic (add to Medication List): Debra [name], Debra A. ([id number]) Wound #4 Right Gluteal fold: Topical Lidocaine 4% cream applied to wound bed prior to debridement (In Clinic Only). Primary Wound Dressing: Wound #4 Right Gluteal fold: Silver Alginate Secondary Dressing: Wound #4 Right Gluteal fold: Boardered Foam Dressing - cover area on the left for protection too Dressing Change Frequency: Wound #4 Right Gluteal fold: Change dressing every day. Follow-up Appointments: Wound #4 Right Gluteal fold: Return Appointment in 1 week. Off-Loading: Wound #4 Right Gluteal fold: Turn and reposition every 2 hours Additional Orders / Instructions: Wound #4 Right Gluteal fold: Increase protein intake. 1. My suggestion at this point is can be given that we continue with the silver alginate dressing I still feel like that is doing well. 2. Also recommend she continue with appropriate offloading I think that is of utmost importance. 3. I would also suggest that the patient continue to monitor for any signs of infection or increased pain though neither is occurring right now and overall she is doing very well. We will see patient back for reevaluation in 1 week here in the clinic. If anything worsens or changes patient will contact our office for additional recommendations. Electronic Signature(s) Signed: 03/14/2020 5:46:59 PM By: Lenda KelpStone III, Keyleigh Manninen PA-C Entered By: Lenda KelpStone III, Khloe Hunkele on 03/14/2020 17:46:59 Debra [name], Debra A.  ([id number]) -------------------------------------------------------------------------------- SuperBill Details Patient Name: Debra [name], Debra A. Date of Service: 03/14/2020 Medical Record Number: [id number] Patient Account Number: [id number] Date of Birth/Sex: 23-Nov-1961 (59 y.o. F) Treating RN: Rodell PernaScott, Dajea Primary Care Provider: Danelle BerryAPIA, LEISA Other Clinician: Referring Provider: Danelle BerryAPIA, LEISA Treating Provider/Extender: Linwood DibblesSTONE III, Saben Donigan Weeks in Treatment: 2 Diagnosis Coding ICD-10 Codes Code Description L02.31 Cutaneous abscess of buttock E11.622 Type 2 diabetes mellitus with other skin ulcer L98.412 Non-pressure chronic ulcer of buttock with fat layer exposed Facility Procedures CPT4 Code: [id number] Description: 99213 - WOUND CARE VISIT-LEV 3 EST PT Modifier: Quantity: 1 Physician Procedures CPT4 Code: [id number] Description: 99213 - WC PHYS LEVEL 3 - EST PT Modifier: Quantity: 1 CPT4 Code: Description: ICD-10 Diagnosis Description L02.31 Cutaneous abscess of buttock E11.622 Type 2 diabetes mellitus with other skin ulcer L98.412 Non-pressure chronic ulcer of buttock with fat layer exposed Modifier: Quantity: Electronic Signature(s) Signed: 03/14/2020 5:47:09 PM By: Lenda KelpStone III, Tessla Spurling PA-C Entered By: Lenda KelpStone III, Mckinzi Eriksen on 03/14/2020 17:47:09

## 2020-03-15 NOTE — Progress Notes (Signed)
SAWSAN, RIGGIO ([id number]) Visit Report for 03/14/2020 Arrival Information Details Patient Name: Debra [name], Debra [name]. Date of Service: 03/14/2020 3:30 PM Medical Record Number: [id number] Patient Account Number: [id number] Date of Birth/Sex: Jun 29, 1961 (59 y.o. F) Treating RN: Army Melia Primary Care Thamas Appleyard: Delsa Grana Other Clinician: Referring Jacarius Handel: Delsa Grana Treating Marlyne Totaro/Extender: Melburn Hake, HOYT Weeks in Treatment: 2 Visit Information History Since Last Visit Added or deleted any medications: No Patient Arrived: Ambulatory Any new allergies or adverse reactions: No Arrival Time: 15:21 Had a fall or experienced change in No Accompanied By: self activities of daily living that may affect Transfer Assistance: None risk of falls: Patient Identification Verified: Yes Signs or symptoms of abuse/neglect since last visito No Secondary Verification Process Completed: Yes Hospitalized since last visit: No Patient Requires Transmission-Based Precautions: No Implantable device outside of the clinic excluding No Patient Has Alerts: No cellular tissue based products placed in the center since last visit: Has Dressing in Place as Prescribed: Yes Pain Present Now: No Electronic Signature(s) Signed: 03/14/2020 4:24:46 PM By: Lorine Bears RCP, RRT, CHT Entered By: Lorine Bears on 03/14/2020 15:22:25 Debra [name] ([id number]) -------------------------------------------------------------------------------- Clinic Level of Care Assessment Details Patient Name: Debra [name] Date of Service: 03/14/2020 3:30 PM Medical Record Number: [id number] Patient Account Number: [id number] Date of Birth/Sex: 11/07/1961 (59 y.o. F) Treating RN: Army Melia Primary Care Christoher Drudge: Delsa Grana Other Clinician: Referring Tawnia Schirm: Delsa Grana Treating Marsella Suman/Extender: Melburn Hake, HOYT Weeks in Treatment: 2 Clinic Level of Care Assessment Items TOOL 4 Quantity Score []  -  Use when only an EandM is performed on FOLLOW-UP visit 0 ASSESSMENTS - Nursing Assessment / Reassessment X - Reassessment of Co-morbidities (includes updates in patient status) 1 10 X- 1 5 Reassessment of Adherence to Treatment Plan ASSESSMENTS - Wound and Skin Assessment / Reassessment X - Simple Wound Assessment / Reassessment - one wound 1 5 []  - 0 Complex Wound Assessment / Reassessment - multiple wounds []  - 0 Dermatologic / Skin Assessment (not related to wound area) ASSESSMENTS - Focused Assessment []  - Circumferential Edema Measurements - multi extremities 0 []  - 0 Nutritional Assessment / Counseling / Intervention []  - 0 Lower Extremity Assessment (monofilament, tuning fork, pulses) []  - 0 Peripheral Arterial Disease Assessment (using hand held doppler) ASSESSMENTS - Ostomy and/or Continence Assessment and Care []  - Incontinence Assessment and Management 0 []  - 0 Ostomy Care Assessment and Management (repouching, etc.) PROCESS - Coordination of Care X - Simple Patient / Family Education for ongoing care 1 15 []  - 0 Complex (extensive) Patient / Family Education for ongoing care []  - 0 Staff obtains Programmer, systems, Records, Test Results / Process Orders []  - 0 Staff telephones HHA, Nursing Homes / Clarify orders / etc []  - 0 Routine Transfer to another Facility (non-emergent condition) []  - 0 Routine Hospital Admission (non-emergent condition) []  - 0 New Admissions / Biomedical engineer / Ordering NPWT, Apligraf, etc. []  - 0 Emergency Hospital Admission (emergent condition) X- 1 10 Simple Discharge Coordination []  - 0 Complex (extensive) Discharge Coordination PROCESS - Special Needs []  - Pediatric / Minor Patient Management 0 []  - 0 Isolation Patient Management []  - 0 Hearing / Language / Visual special needs []  - 0 Assessment of Community assistance (transportation, D/C planning, etc.) Debra [name], Debra [name] ([id number]) []  - 0 Additional assistance / Altered  mentation []  - 0 Support Surface(s) Assessment (bed, cushion, seat, etc.) INTERVENTIONS - Wound Cleansing / Measurement X - Simple Wound Cleansing - one wound 1 5 []  -  0 Complex Wound Cleansing - multiple wounds X- 1 5 Wound Imaging (photographs - any number of wounds) []  - 0 Wound Tracing (instead of photographs) X- 1 5 Simple Wound Measurement - one wound []  - 0 Complex Wound Measurement - multiple wounds INTERVENTIONS - Wound Dressings []  - Small Wound Dressing one or multiple wounds 0 X- 1 15 Medium Wound Dressing one or multiple wounds []  - 0 Large Wound Dressing one or multiple wounds []  - 0 Application of Medications - topical []  - 0 Application of Medications - injection INTERVENTIONS - Miscellaneous []  - External ear exam 0 []  - 0 Specimen Collection (cultures, biopsies, blood, body fluids, etc.) []  - 0 Specimen(s) / Culture(s) sent or taken to Lab for analysis []  - 0 Patient Transfer (multiple staff / / Similar devices) []  - 0 Simple Staple / Suture removal (25 or less) []  - 0 Complex Staple / Suture removal (26 or more) []  - 0 Hypo / Hyperglycemic Management (close monitor of Blood Glucose) []  - 0 Ankle / Brachial Index (ABI) - do not check if billed separately X- 1 5 Vital Signs Has the patient been seen at the hospital within the last three years: Yes Total Score: 80 Level Of Care: New/Established - Level 3 Electronic Signature(s) Signed: 03/14/2020 4:14:40 PM By: Entered By: on 03/14/2020 15:51:55 ( ) -------------------------------------------------------------------------------- Encounter Discharge Information Details Patient Name: . Date of Service: 03/14/2020 3:30 PM Medical Record Number: Patient Account Number: Nurse, adult Date of Birth/Sex: 03-02-1961 (59 y.o. F) Treating RN: Primary Care Arush Gatliff: Other Clinician: Referring Raimundo Corbit:  05/14/2020 Treating Sareen Randon/Extender: Rodell Perna, HOYT Weeks in Treatment: 2 Encounter Discharge Information Items Discharge Condition: Stable Ambulatory Status: Ambulatory Discharge Destination: Home Transportation: Private Auto Accompanied By: self Schedule Follow-up Appointment: Yes Clinical Summary of Care: Electronic Signature(s) Signed: 03/14/2020 4:14:40 PM By: 05/14/2020 Entered By: Debra [name] on 03/14/2020 15:52:43 Debra [name] (05/14/2020) -------------------------------------------------------------------------------- Lower Extremity Assessment Details Patient Name: [id number] Date of Service: 03/14/2020 3:30 PM Medical Record Number: 11/05/1961 Patient Account Number: [id number] Date of Birth/Sex: [birth year] (59 y.o. F) Treating RN: Danelle Berry Primary Care Kenston Longton: Linwood Dibbles Other Clinician: Referring Ednah Hammock: 05/14/2020 Treating Mosie Angus/Extender: Rodell Perna, HOYT Weeks in Treatment: 2 Electronic Signature(s) Signed: 03/14/2020 4:13:45 PM By: 05/14/2020 Entered By: Debra [name] on 03/14/2020 15:27:50 Debra [name] (05/14/2020) -------------------------------------------------------------------------------- Multi Wound Chart Details Patient Name: [id number] Date of Service: 03/14/2020 3:30 PM Medical Record Number: 11/05/1961 Patient Account Number: [id number] Date of Birth/Sex: Feb 23, 1961 (58 y.o. F) Treating RN: Danelle Berry Primary Care Ambrielle Kington: Linwood Dibbles Other Clinician: Referring Bauer Ausborn: 05/14/2020 Treating Tarrin Menn/Extender: Curtis Sites, HOYT Weeks in Treatment: 2 Vital Signs Height(in): 65 Pulse(bpm): 73 Weight(lbs): 293 Blood Pressure(mmHg): 121/56 Body Mass Index(BMI): 49 Temperature(F): 98.7 Respiratory Rate(breaths/min): 18 Photos: [N/A:N/A] Wound Location: Right Gluteal fold N/A N/A Wounding Event: Gradually Appeared N/A N/A Primary Etiology: Abscess N/A N/A Comorbid History: Hypertension, Type II Diabetes, N/A  N/A Osteoarthritis Date Acquired: 02/16/2020 N/A N/A Weeks of Treatment: 2 N/A N/A Wound Status: Open N/A N/A Measurements L x W x D (cm) 0.5x2x0.5 N/A N/A Area (cm) : 0.785 N/A N/A Volume (cm) : 0.393 N/A N/A % Reduction in Area: 66.70% N/A N/A % Reduction in Volume: 86.10% N/A N/A Classification: Full Thickness Without Exposed N/A N/A Support Structures Exudate Amount: Medium N/A N/A Exudate Type: Serous N/A N/A Exudate Color: amber N/A N/A Wound Margin: Well  defined, not attached N/A N/A Granulation Amount: Medium (34-66%) N/A N/A Granulation Quality: Pink N/A N/A Necrotic Amount: Medium (34-66%) N/A N/A Exposed Structures: Fat Layer (Subcutaneous Tissue) N/A N/A Exposed: Yes Fascia: No Tendon: No Muscle: No Joint: No Bone: No Epithelialization: None N/A N/A Treatment Notes Electronic Signature(s) Signed: 03/14/2020 4:14:40 PM By: Rodell Perna Entered By: Rodell Perna on 03/14/2020 15:50:58 Debra [name] ([id number] [name] ([id number]) -------------------------------------------------------------------------------- Multi-Disciplinary Care Plan Details Patient Name: Debra [name]. Date of Service: 03/14/2020 3:30 PM Medical Record Number: [id number] Patient Account Number: [id number] Date of Birth/Sex: Feb 16, 1961 (59 y.o. F) Treating RN: Rodell Perna Primary Care Omarii Scalzo: Danelle Berry Other Clinician: Referring Jhony Antrim: Danelle Berry Treating Amarachukwu Lakatos/Extender: Linwood Dibbles, HOYT Weeks in Treatment: 2 Active Inactive Necrotic Tissue Nursing Diagnoses: Impaired tissue integrity related to necrotic/devitalized tissue Goals: Necrotic/devitalized tissue will be minimized in the wound bed Date Initiated: 02/23/2020 Target Resolution Date: 03/06/2020 Goal Status: Active Interventions: Assess patient pain level pre-, during and post procedure and prior to discharge Treatment Activities: Excisional debridement : 02/23/2020 Notes: Orientation to the Wound Care  Program Nursing Diagnoses: Knowledge deficit related to the wound healing center program Goals: Patient/caregiver will verbalize understanding of the Wound Healing Center Program Date Initiated: 02/23/2020 Target Resolution Date: 03/06/2020 Goal Status: Active Interventions: Provide education on orientation to the wound center Notes: Soft Tissue Infection Nursing Diagnoses: Impaired tissue integrity Knowledge deficit related to home infection control: handwashing, handling of soiled dressings, supply storage Goals: Patient will remain free of wound infection Date Initiated: 02/23/2020 Target Resolution Date: 03/06/2020 Goal Status: Active Interventions: Assess signs and symptoms of infection every visit Notes: Wound/Skin Impairment Debra [name], Debra [name] ([id number]) Nursing Diagnoses: Impaired tissue integrity Goals: Ulcer/skin breakdown will have a volume reduction of 30% by week 4 Date Initiated: 02/23/2020 Target Resolution Date: 03/25/2020 Goal Status: Active Interventions: Assess patient/caregiver ability to obtain necessary supplies Treatment Activities: Skin care regimen initiated : 02/23/2020 Topical wound management initiated : 02/23/2020 Notes: Electronic Signature(s) Signed: 03/14/2020 4:14:40 PM By: Rodell Perna Entered By: Rodell Perna on 03/14/2020 15:50:51 Debra [name] ([id number]) -------------------------------------------------------------------------------- Pain Assessment Details Patient Name: Debra [name]. Date of Service: 03/14/2020 3:30 PM Medical Record Number: [id number] Patient Account Number: [id number] Date of Birth/Sex: 1961-03-19 (59 y.o. F) Treating RN: Curtis Sites Primary Care Reighan Hipolito: Danelle Berry Other Clinician: Referring Special Ranes: Danelle Berry Treating Ichiro Chesnut/Extender: Linwood Dibbles, HOYT Weeks in Treatment: 2 Active Problems Location of Pain Severity and Description of Pain Patient Has Paino No Site Locations Pain Management and  Medication Current Pain Management: Electronic Signature(s) Signed: 03/14/2020 4:13:45 PM By: Curtis Sites Entered By: Curtis Sites on 03/14/2020 15:27:43 Debra [name] ([id number]) -------------------------------------------------------------------------------- Patient/Caregiver Education Details Patient Name: Debra [name]. Date of Service: 03/14/2020 3:30 PM Medical Record Number: [id number] Patient Account Number: [id number] Date of Birth/Gender: [birth year] (59 y.o. F) Treating RN: Rodell Perna Primary Care Physician: Danelle Berry Other Clinician: Referring Physician: Danelle Berry Treating Physician/Extender: Skeet Simmer in Treatment: 2 Education Assessment Education Provided To: Patient Education Topics Provided Wound/Skin Impairment: Handouts: Caring for Your Ulcer Methods: Demonstration, Explain/Verbal Responses: State content correctly Electronic Signature(s) Signed: 03/14/2020 4:14:40 PM By: Rodell Perna Entered By: Rodell Perna on 03/14/2020 15:52:19 Debra [name] ([id number]) -------------------------------------------------------------------------------- Wound Assessment Details Patient Name: Debra [name] Date of Service: 03/14/2020 3:30 PM Medical Record Number: [id number] Patient Account Number: [id number] Date of Birth/Sex: [birth year] (59 y.o. F) Treating RN: Curtis Sites Primary Care Tazaria Dlugosz: Danelle Berry Other Clinician: Referring Dalal Livengood: Danelle Berry Treating Channel Papandrea/Extender: Larina Bras  III, HOYT Weeks in Treatment: 2 Wound Status Wound Number: 4 Primary Etiology: Abscess Wound Location: Right Gluteal fold Wound Status: Open Wounding Event: Gradually Appeared Comorbid History: Hypertension, Type II Diabetes, Osteoarthritis Date Acquired: 02/16/2020 Weeks Of Treatment: 2 Clustered Wound: No Photos Wound Measurements Length: (cm) 0.5 Width: (cm) 2 Depth: (cm) 0.5 Area: (cm) 0.785 Volume: (cm) 0.393 % Reduction in Area: 66.7% % Reduction in  Volume: 86.1% Epithelialization: None Tunneling: No Undermining: No Wound Description Classification: Full Thickness Without Exposed Support Structu Wound Margin: Well defined, not attached Exudate Amount: Medium Exudate Type: Serous Exudate Color: amber res Foul Odor After Cleansing: No Slough/Fibrino Yes Wound Bed Granulation Amount: Medium (34-66%) Exposed Structure Granulation Quality: Pink Fascia Exposed: No Necrotic Amount: Medium (34-66%) Fat Layer (Subcutaneous Tissue) Exposed: Yes Necrotic Quality: Adherent Slough Tendon Exposed: No Muscle Exposed: No Joint Exposed: No Bone Exposed: No Treatment Notes Wound #4 (Right Gluteal fold) Notes silver cell packed into wound, brdered foam dressing Electronic Signature(s) Signed: 03/14/2020 4:13:45 PM By: Princess Perna, Rosana Hoes ([id number]) Entered By: Curtis Sites on 03/14/2020 15:29:42 Debra [name] ([id number]) -------------------------------------------------------------------------------- Vitals Details Patient Name: Debra [name]. Date of Service: 03/14/2020 3:30 PM Medical Record Number: [id number] Patient Account Number: [id number] Date of Birth/Sex: [birth year] (59 y.o. F) Treating RN: Rodell Perna Primary Care Ezekial Arns: Danelle Berry Other Clinician: Referring Jamielynn Wigley: Danelle Berry Treating Demica Zook/Extender: Linwood Dibbles, HOYT Weeks in Treatment: 2 Vital Signs Time Taken: 15:20 Temperature (F): 98.7 Height (in): 65 Pulse (bpm): 73 Weight (lbs): 293 Respiratory Rate (breaths/min): 18 Body Mass Index (BMI): 48.8 Blood Pressure (mmHg): 121/56 Reference Range: 80 - 120 mg / dl Electronic Signature(s) Signed: 03/14/2020 4:24:46 PM By: Dayton Martes RCP, RRT, CHT Entered By: Dayton Martes on 03/14/2020 15:25:24

## 2020-03-18 ENCOUNTER — Ambulatory Visit (INDEPENDENT_AMBULATORY_CARE_PROVIDER_SITE_OTHER): Payer: 59 | Admitting: Licensed Clinical Social Worker

## 2020-03-18 ENCOUNTER — Other Ambulatory Visit: Payer: Self-pay

## 2020-03-18 ENCOUNTER — Encounter: Payer: Self-pay | Admitting: Licensed Clinical Social Worker

## 2020-03-18 ENCOUNTER — Ambulatory Visit: Payer: Medicare Other | Attending: Internal Medicine

## 2020-03-18 DIAGNOSIS — F3162 Bipolar disorder, current episode mixed, moderate: Secondary | ICD-10-CM

## 2020-03-18 DIAGNOSIS — Z23 Encounter for immunization: Secondary | ICD-10-CM

## 2020-03-18 NOTE — Progress Notes (Signed)
   Covid-19 Vaccination Clinic  Name:  Debra [name]    MRN: [id number] DOB: [birth year]  03/18/2020  [name] was observed post Covid-19 immunization for 15 minutes without incident. She was provided with Vaccine Information Sheet and instruction to access the V-Safe system.   [name] was instructed to call 911 with any severe reactions post vaccine: [name] Difficulty breathing  . Swelling of face and throat  . A fast heartbeat  . A bad rash all over body  . Dizziness and weakness   Immunizations Administered    Name Date Dose VIS Date Route   Pfizer COVID-19 Vaccine 03/18/2020  1:04 PM 0.3 mL 11/17/2019 Intramuscular   Manufacturer: [employer], [employer]   Lot: [id number]   NDC: [address]-3

## 2020-03-18 NOTE — Progress Notes (Signed)
Virtual Visit via Video Note  I connected with Debra [name] on 03/18/20 at 11:00 AM EDT by a video enabled telemedicine application and verified that I am speaking with the correct person using two identifiers.   I discussed the limitations of evaluation and management by telemedicine and the availability of in person appointments. The patient expressed understanding and agreed to proceed.  I discussed the assessment and treatment plan with the patient. The patient was provided an opportunity to ask questions and all were answered. The patient agreed with the plan and demonstrated an understanding of the instructions.   The patient was advised to call back or seek an in-person evaluation if the symptoms worsen or if the condition fails to improve as anticipated.  I provided 45 minutes of non-face-to-face time during this encounter.   Debra Dach, LCSW   THERAPIST PROGRESS NOTE  Session Time: 1100  Participation Level: Active  Behavioral Response: CasualAlertAnxious  Type of Therapy: Individual Therapy  Treatment Goals addressed: Coping  Interventions: Supportive  Summary: Debra [name] is a 59 y.o. female who presents with continued symptoms related to her diagnosis. Debra [name] reports doing well since our last session. She reports, "not a lot has really changed." LCSW validated Debra [name]'s feelings and encouraged her to discuss how her mood was over the last few weeks. Debra [name] reports she was able to spend some quality time with her husband, which she felt was a much needed day for them. LCSW validated this use of self care time, and how important it is to feel connected to those in your household. LCSW held space for Debra [name] to discuss various other aspects of her life that have been going well, and discussed some aspects of her living situation she finds annoying. LCSW offered insight where appropriate, and encouraged [name] to utilize CBT skills to manage anxiety and depression moving forward.    Suicidal/Homicidal: No  Therapist Response: Debra [name] continues to work towards her tx goals but has not yet reached them. We will continue to work on improving emotional regualtion skills and distress tolerance moving forward.   Plan: Return again in 4 weeks.  Diagnosis: Axis I: Bipolar 1 Disorder    Axis II: No diagnosis    Debra Dach, LCSW 03/18/2020

## 2020-03-21 ENCOUNTER — Ambulatory Visit (INDEPENDENT_AMBULATORY_CARE_PROVIDER_SITE_OTHER): Payer: 59 | Admitting: Psychiatry

## 2020-03-21 ENCOUNTER — Encounter: Payer: Self-pay | Admitting: Psychiatry

## 2020-03-21 ENCOUNTER — Other Ambulatory Visit: Payer: Self-pay

## 2020-03-21 DIAGNOSIS — F3177 Bipolar disorder, in partial remission, most recent episode mixed: Secondary | ICD-10-CM | POA: Diagnosis not present

## 2020-03-21 DIAGNOSIS — F17200 Nicotine dependence, unspecified, uncomplicated: Secondary | ICD-10-CM | POA: Diagnosis not present

## 2020-03-21 DIAGNOSIS — F4310 Post-traumatic stress disorder, unspecified: Secondary | ICD-10-CM

## 2020-03-21 NOTE — Progress Notes (Signed)
Provider Location : [facility] Patient Location : Home  Virtual Visit via Video Note  I connected with Debra [name] on 03/21/20 at  9:00 AM EDT by a video enabled telemedicine application and verified that I am speaking with the correct person using two identifiers.   I discussed the limitations of evaluation and management by telemedicine and the availability of in person appointments. The patient expressed understanding and agreed to proceed.    I discussed the assessment and treatment plan with the patient. The patient was provided an opportunity to ask questions and all were answered. The patient agreed with the plan and demonstrated an understanding of the instructions.   The patient was advised to call back or seek an in-person evaluation if the symptoms worsen or if the condition fails to improve as anticipated.  [name] OP Progress Note  03/21/2020 5:38 PM [name]  MRN:  [id number]  Chief Complaint:  Chief Complaint    Follow-up     HPI: Debra [name] is a 59 year old Caucasian female, married, on disability, lives in [place], has a history of bipolar disorder, PTSD, tobacco use disorder, parkinsonian tremors, chronic back pain, OSA, hypertension, possible REM sleep behavior disorder was evaluated by telemedicine today.  Patient today reports she is currently making progress with regards to her mood symptoms.  She is not as depressed or anxious as she used to be few weeks ago.  She is compliant on her medications as prescribed.  She reports sleep as good.  She reports her appetite continues to fluctuate.  There are days when she eats well and there are other days when she is not that hungry.  Patient reports she currently does not have any hallucinations.  Patient reports she is having postural tremors as well as tremors of her bilateral upper extremities.  She is planning to go back to her neurologist for follow-up.  Patient denies any suicidality or homicidality.  Patient denies any  other concerns today. Visit Diagnosis:    ICD-10-CM   1. Bipolar disorder, in partial remission, most recent episode mixed (HCC)  F31.77   2. PTSD (post-traumatic stress disorder)  F43.10   3. Tobacco use disorder  F17.200     Past Psychiatric History: I have reviewed past psychiatric history from my progress note on 02/16/2019.  Past trials of Paxil, Depakote, Haldol, trazodone, melatonin.  Past Medical History:  Past Medical History:  Diagnosis Date  . Anxiety   . Arthritis   . C. difficile colitis   . C. difficile colitis   . Depression   . Diabetes mellitus without complication ([place])    Type II  . Diabetes mellitus, type II ([place])   . Family history of adverse reaction to anesthesia    Mother - BP drops  . Head injury, closed, with brief LOC ([place]) 2011ish  . Hyperlipidemia   . Hypertension   . Insomnia   . Myalgia 03/29/2018   Due to statin  . Obesity   . Parkinson's disease ([place])   . Seasonal allergies   . Seizures ([place])   . Tobacco abuse     Past Surgical History:  Procedure Laterality Date  . ABDOMINAL HYSTERECTOMY  2012  . BREAST BIOPSY Right   . CESAREAN SECTION  1992  . CHOLECYSTECTOMY  2013  . LUMBAR LAMINECTOMY/DECOMPRESSION MICRODISCECTOMY Left 09/10/2015   Procedure: Left L4-5 Foraminotomy/Left L5-S1 Diskectomy;  Surgeon: Leeroy Cha, MD;  Location: [facility];  Service: Neurosurgery;  Laterality: Left;  Left L4-5 Foraminotomy/Left L5-S1  Diskectomy    Family Psychiatric History: Reviewed family psychiatric history from my progress note on 02/16/2019.  Family History:  Family History  Problem Relation Age of Onset  . COPD Mother   . Bipolar disorder Father   . Diabetes Mellitus II Father   . Bipolar disorder Brother   . Diabetes Mellitus II Brother   . Bipolar disorder Brother     Social History: Reviewed social history from my progress note on 02/16/2019. Social History   Socioeconomic History  . Marital status: Married    Spouse name: [name]   . Number of children: 1  . Years of education: Not on file  . Highest education level: Associate degree: occupational, Hotel manager, or vocational program  Occupational History  . Occupation: Disabled  Tobacco Use  . Smoking status: Current Every Day Smoker    Packs/day: 0.50    Years: 24.00    Pack years: 12.00    Types: Cigarettes    Start date: 03/19/2004  . Smokeless tobacco: Never Used  Substance and Sexual Activity  . Alcohol use: Yes    Alcohol/week: 0.0 standard drinks    Comment: occasional- rare  . Drug use: No  . Sexual activity: Not Currently  Other Topics Concern  . Not on file  Social History Narrative   Live in private residence w/ husband and their friend who is wheelchair bound, her end of the house is wheelchair accessible.    Social Determinants of Health   Financial Resource Strain: Low Risk   . Difficulty of Paying Living Expenses: Not hard at all  Food Insecurity: Food Insecurity Present  . Worried About Charity fundraiser in the Last Year: Sometimes true  . Ran Out of Food in the Last Year: Never true  Transportation Needs:   . Lack of Transportation (Medical):   [name] Lack of Transportation (Non-Medical):   Physical Activity:   . Days of Exercise per Week:   . Minutes of Exercise per Session:   Stress: Stress Concern Present  . Feeling of Stress : To some extent  Social Connections: Unknown  . Frequency of Communication with Friends and Family: More than three times a week  . Frequency of Social Gatherings with Friends and Family: Not on file  . Attends Religious Services: Not on file  . Active Member of Clubs or Organizations: Not on file  . Attends Archivist Meetings: Not on file  . Marital Status: Not on file    Allergies:  Allergies  Allergen Reactions  . Clindamycin/Lincomycin     Pt states caused C-Diff  . Metformin And Related Diarrhea  . Benadryl [Diphenhydramine Hcl] Swelling    blistering  . Canagliflozin Other (See  Comments)    Other reaction(s): Other (See Comments) Low bp Low bp Low bp  . Depakote Er [Divalproex Sodium Er] Other (See Comments)    seizures  . Dilaudid [Hydromorphone Hcl] Other (See Comments)    seizures  . Diphenhydramine-Zinc Acetate Other (See Comments)  . Erythromycin   . Ethanol   . Haloperidol Other (See Comments)  . Hydromorphone Other (See Comments)  . Neosporin [Neomycin-Bacitracin Zn-Polymyx] Swelling    blistering  . Singulair [Montelukast Sodium] Other (See Comments)    Asthma attacks  . Sitagliptin Other (See Comments)  . Statins Other (See Comments)    Other reaction(s): Unknown  . Sulfa Antibiotics Hives    Other reaction(s): Unknown  . Victoza [Liraglutide] Other (See Comments) and Nausea And Vomiting    Other  reaction(s): Other (See Comments) pancreatitis pancreatitis    Metabolic Disorder Labs: Lab Results  Component Value Date   HGBA1C 8.3 (H) 09/21/2019   MPG 192 09/21/2019   MPG 214 06/16/2019   No results found for: PROLACTIN Lab Results  Component Value Date   CHOL 98 09/21/2019   TRIG 167 (H) 09/21/2019   HDL 49 (L) 09/21/2019   CHOLHDL 2.0 09/21/2019   LDLCALC 24 09/21/2019   LDLCALC 49 06/16/2019   Lab Results  Component Value Date   TSH 1.671 02/16/2019   TSH 2.160 08/20/2015    Therapeutic Level Labs: No results found for: LITHIUM No results found for: VALPROATE No components found for:  CBMZ  Current Medications: Current Outpatient Medications  Medication Sig Dispense Refill  . aspirin EC 81 MG tablet Take 81 mg by mouth daily.    . blood glucose meter kit and supplies Use up to four times daily as directed, Dx E11.65, LON 99 months 1 each 0  . Calcium Carbonate-Vitamin D 600-200 MG-UNIT TABS Take by mouth.    . Cholecalciferol 5000 units capsule Take 5,000 Units by mouth.     . clonazePAM (KLONOPIN) 0.5 MG tablet Take by mouth.    . clonazePAM (KLONOPIN) 0.5 MG tablet Take 1 tablet (0.5 mg total) by mouth as  directed. Take one tablet once a day as needed up to 2-3 times a week only for severe anxiety attacks 12 tablet 0  . clotrimazole-betamethasone (LOTRISONE) cream Apply 1 application topically 2 (two) times daily. Sparingly to affected skin for up to 7 d at a time 30 g 1  . doxycycline (VIBRAMYCIN) 100 MG capsule Take 1 capsule (100 mg total) by mouth 2 (two) times daily. 20 capsule 0  . Evolocumab (REPATHA SURECLICK) 093 MG/ML SOAJ Inject 140 mg into the skin every 14 (fourteen) days. 6 pen 0  . FARXIGA 10 MG TABS tablet TAKE 1 TABLET DAILY (HIGHER DOSE) 90 tablet 3  . fluconazole (DIFLUCAN) 150 MG tablet Take 1 tablet (150 mg total) by mouth every 3 (three) days as needed (for vaginal itching/yeast infection sx). 2 tablet 4  . folic acid (FOLVITE) 235 MCG tablet Take 800 mcg by mouth daily.     [name] glucose blood (ONE TOUCH ULTRA TEST) test strip USE UP TO FOUR TIMES A DAY AS DIRECTED; Dx 11.51, LON 99 months 300 each 3  . HUMALOG KWIKPEN 100 UNIT/ML KwikPen Give subcutaneously with meals, 20 units in the AM and 35 units with lunch and evening meal 27 pen 5  . hydrochlorothiazide (HYDRODIURIL) 25 MG tablet TAKE 1 TABLET DAILY (DOSE INCREASE, REPLACES ALL OTHER HCTZ PRESCRIPTIONS) 90 tablet 3  . HYDROcodone-acetaminophen (NORCO/VICODIN) 5-325 MG tablet TAKE 1 2 TABLETS BY MOUTH EVERY 6 HOURS AS NEEDED FOR PAIN    . Insulin Pen Needle 29G X 12.7MM MISC 1 each by Does not apply route as needed. 100 each 3  . lamoTRIgine (LAMICTAL) 100 MG tablet Take 1.5 tablets (150 mg total) by mouth daily. 135 tablet 0  . Lancets (ONETOUCH DELICA PLUS TDDUKG25K) MISC CHECK FINGER STICK BLOOD SUGARS FOUR TIMES A DAY 300 each 3  . lisinopril (PRINIVIL,ZESTRIL) 40 MG tablet Take 1 tablet (40 mg total) by mouth daily. 90 tablet 3  . Melatonin 10 MG TABS Take 1 tablet by mouth at bedtime.    . Multiple Vitamins-Minerals (CENTRUM WOMEN) TABS Take 1 tablet by mouth.    [name] PARoxetine (PAXIL) 40 MG tablet TAKE 1 TABLET EVERY  MORNING 90  tablet 1  . pioglitazone (ACTOS) 30 MG tablet TAKE 1 TABLET DAILY 90 tablet 0  . pregabalin (LYRICA) 25 MG capsule Take 1 capsule (25 mg total) by mouth 2 (two) times daily. 60 capsule 3  . propranolol (INNOPRAN XL) 80 MG 24 hr capsule TAKE 1 CAPSULE DAILY    . TRESIBA FLEXTOUCH 200 UNIT/ML SOPN Inject 120 Units into the skin at bedtime. (Patient taking differently: Inject 120 Units into the skin every morning. ) 36 pen 5   No current facility-administered medications for this visit.     Musculoskeletal: Strength & Muscle Tone: UTA Gait & Station: normal Patient leans: N/A  Psychiatric Specialty Exam: Review of Systems  Neurological: Positive for tremors.  Psychiatric/Behavioral: Negative for agitation, behavioral problems, confusion, decreased concentration, dysphoric mood, hallucinations, self-injury, sleep disturbance and suicidal ideas. The patient is not nervous/anxious and is not hyperactive.   All other systems reviewed and are negative.   There were no vitals taken for this visit.There is no height or weight on file to calculate BMI.  General Appearance: Casual  Eye Contact:  Fair  Speech:  Clear and Coherent  Volume:  Normal  Mood:  Euthymic  Affect:  Appropriate  Thought Process:  Goal Directed and Descriptions of Associations: Intact  Orientation:  Full (Time, Place, and Person)  Thought Content: Logical   Suicidal Thoughts:  No  Homicidal Thoughts:  No  Memory:  Immediate;   Fair Recent;   Fair Remote;   Fair  Judgement:  Fair  Insight:  Fair  Psychomotor Activity:  Tremor  Concentration:  Concentration: Fair and Attention Span: Fair  Recall:  [employer] of Knowledge: Fair  Language: Fair  Akathisia:  No  Handed:  Right  AIMS (if indicated):UTA  Assets:  Communication Skills Desire for Improvement Housing Social Support  ADL's:  Intact  Cognition: WNL  Sleep:  Fair   Screenings: Mini-Mental     Office Visit from 09/21/2016 in [facility]  Total Score (max 30 points )  27    PHQ2-9     Office Visit from 02/16/2020 in [facility] Video Visit from 12/06/2019 in [facility] Office Visit from 09/21/2019 in [facility] Office Visit from 09/07/2019 in [facility] Office Visit from 06/13/2019 in [facility]  PHQ-2 Total Score  5  0  0  0  0  PHQ-9 Total Score  11  0  0  0  0       Assessment and Plan: Debra [name] is a 58 year old Caucasian female, married, disabled, lives in [place], has a history of bipolar disorder, PTSD, hypertension, diabetes melitis, OSA on CPAP, chronic pain was evaluated by telemedicine today.  Patient is biologically predisposed given her history of trauma, family history of mental health problems.  Patient with psychosocial stressors of the pandemic, mother's health issues, her own health problems, financial problems.  Patient however is currently making progress however does struggle with possible tremors and has upcoming appointment with neurology.  Plan as noted below.  Plan Bipolar disorder mixed-improving Lamotrigine 150 mg p.o. daily Paxil 40 mg p.o. daily Klonopin 0.5 mg p.o. as needed 2-3 times a week for severe anxiety attacks only.  PTSD-stable Paxil as prescribed Patient is aware that her therapist is leaving practice and will establish care with a new therapist.  Insomnia-stable Melatonin as prescribed Continue CPAP for OSA  Tobacco use disorder-improving Provided smoking cessation counseling  Follow-up in  clinic in 6 weeks or sooner if needed.  I have spent atleast 20 minutes non face to face with patient today. More than 50 % of the time was spent for preparing to see the patient ( e.g., review of test, records ), ordering medications and test ,psychoeducation and supportive psychotherapy and care coordination,as well as documenting clinical information in electronic  health record. This note was generated in part or whole with voice recognition software. Voice recognition is usually quite accurate but there are transcription errors that can and very often do occur. I apologize for any typographical errors that were not detected and corrected.      Ursula Alert, MD 03/21/2020, 5:38 PM

## 2020-03-22 ENCOUNTER — Encounter: Payer: Managed Care, Other (non HMO) | Admitting: Physician Assistant

## 2020-03-22 DIAGNOSIS — L0231 Cutaneous abscess of buttock: Secondary | ICD-10-CM | POA: Diagnosis not present

## 2020-03-22 NOTE — Progress Notes (Addendum)
KRYSTYN, PICKING ([id number]) Visit Report for 03/22/2020 Chief Complaint Document Details Patient Name: Debra [name], Debra [name]. Date of Service: 03/22/2020 12:30 PM Medical Record Number: [id number] Patient Account Number: [id number] Date of Birth/Sex: Mar 15, 1961 (59 y.o. F) Treating RN: Montey Hora Primary Care Provider: Delsa Grana Other Clinician: Referring Provider: Delsa Grana Treating Provider/Extender: Melburn Hake, Jovontae Banko Weeks in Treatment: 4 Information Obtained from: Patient Chief Complaint Right gluteal fold abscess Electronic Signature(s) Signed: 03/22/2020 12:51:52 PM By: Worthy Keeler PA-C Entered By: Worthy Keeler on 03/22/2020 12:51:52 Debra [name] ([id number]) -------------------------------------------------------------------------------- HPI Details Patient Name: Debra [name] Date of Service: 03/22/2020 12:30 PM Medical Record Number: [id number] Patient Account Number: [id number] Date of Birth/Sex: 08-11-1961 (59 y.o. 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 She tells me everything was fine up until a week ago. She noticed pain in her left groin area. By Saturday this it started to drain. She applied black salve to this area which she has done in the past and is helped heal problems in this area. This did not work. She was seen in the ER on 03/27/18 with an abscess in the left groin. Noted to have surrounding cellulitis and an indurated area. She underwent an IandD. She was started on patient clindamycin. Her white count was 11.9. Comprehensive metabolic panel normal lactic acid level normal at 1.2. They did not do a culture that I can see. She was seen yesterday at her  primary physician's office she may have been put on cephalexin at that point. She is using iodoform packing The patient is a type II diabetic with a history of PAD last hemoglobin A1c of 7.3. She has a history of parkinsonism, COPD, bipolar disorder hypertension. She is a continued 1 pack per day smoker She tells me that she does have a history of recurrent abscesses in this area last about a year ago. Mostly she seems to care for these herself and they seem to go away. 04/06/18; patient's surgical IandD on the lower left abdominal quadrant just above the symphysis pubis appears better. Following is better we've been using silver alginate. Culture I did last week did not grow MRSA rather Enterococcus faecalis. I substituted Augmentin for the clindamycin however the patient didn't have enough money, however she will pick it up this morning 04/13/18; surgical IandD on the lower left abdominal quadrant just above the symphysis pubis. Surface of this area looks healthy. This is an oval-shaped wound lying horizontally. Medially it has A, all with about 0.9 cm in depth there is no drainage no surrounding erythema. She is completing the Augmentin I gave her last week 04/27/18; surgical IandD site on the lower left abdominal quadrant just above the symphysis pubis. Surface of this wound is improved dimensions are better. We've been using silver alginate and border foam. She tells me she developed C. difficile has been treated with oral vancomycin. Will need to be very judicious with any further antibiotics 05/04/18; surgical IandD site on the lower left abdomen just above the symphysis pubis. All of this is closed over except a small slit like area which is probably where the sinus tract was. There is no drainage no tenderness. 05/11/18; surgical IandD site on the left lower abdomen just above the  symphysis pubis. All of this is closed. There is no open area here. No drainage and no tenderness she does have a  history of abscesses but apparently none that have had difficulty healing like this one READMISSION 11/09/18 This is a patient we know from previous stays in this clinic. She apparently has a history of recurrent Cutaneous abscesses. We had her in the clinic last time with a lower abdomen/inguinal area abscess that required a surgical IandD. We managed to get this to close over. She also lives in fear of recurrent C. difficile and does not want systemic antibiotics. She tells [place] that she developed a small abscess in the wound area about 2-3 weeks ago. This spontaneously ruptured and had purulent material maintained. She is here for our review of the resultant wound 11/23/2018; interestingly the area of the opened for which we saw her 2 weeks ago has closed. This was likely an abscess given the clinical description although we did not really see it at the beginning. Over the timeframe since we have seen here she developed a second area just underneath the original one from last week. As her friend describes this it is a purple papule that is painful, develops purulence ruptures and then heals. Right now it is a flat open area. 12/14/2018; the patient has no open wound. By description these are said to be abscess these perform rupture and then formal wound. This is a second time we have had her for this. Readmission: 02/23/2020 patient presents for readmission in the clinic here today concerning issues that she has been having with a right gluteal fold abscess. She was actually seen in urgent care where they actually did perform an incision and drainage. They also did place her on antibiotics as well. She is currently on doxycycline. With that being said the good news is this does not appear to be significantly infected although there is necrotic tissue I think we need to work on getting this cleaned out but nonetheless I do not see any signs of active local or systemic infection. She does have a history  of diabetes mellitus type 2. 03/01/2020 on evaluation today patient appears to be doing better with regard to her wound. She is measuring much smaller compared to prior evaluation. Fortunately there is no signs of active infection at this time. No fevers, chills, nausea, vomiting, or diarrhea. 03/08/2020 upon evaluation today patient appears to be doing well with regard to her wound in my opinion. She is showing some signs of improvement here which is excellent news. There is no evidence of active infection at this time. No fevers, chills, nausea, vomiting, or diarrhea. Overall I am extremely pleased with the progress that she is making. She did have an abscess area on the opposite side in the similar location that she notes popped up in the past couple of days. I did look at that as well that had a bandage on it there was really no significant drainage and did not appear to be thing actually open I believe this may be more of a hair bump type issue. With that being said I do think that she may have frequent Debra [name], Debra A. ([id number]) folliculitis/abscesses that need to be addressed. I think that we may have some recommendations for her here. 03/14/20 upon evaluation today patient appears to be doing very well with regard to her wound. She has been tolerating the dressing changes without complication. Fortunately there is no signs of active infection at this time.  Overall I feel like this is progressing quite nicely. 03/22/2020 upon evaluation today patient appears to be doing extremely well with regard to her wound. In fact this appears to be completely healed which is great news. Fortunately there is no signs of active infection at this time. No fevers, chills, nausea, vomiting, or diarrhea. Electronic Signature(s) Signed: 03/22/2020 1:47:25 PM By: Lenda Kelp PA-C Entered By: Lenda Kelp on 03/22/2020 13:47:24 Debra [name]  ([id number]) -------------------------------------------------------------------------------- Physical Exam Details Patient Name: Debra [name] Date of Service: 03/22/2020 12:30 PM Medical Record Number: [id number] Patient Account Number: [id number] Date of Birth/Sex: Nov 05, 1961 (59 y.o. F) Treating RN: Curtis Sites Primary Care Provider: Danelle Berry Other Clinician: Referring Provider: Danelle Berry Treating Provider/Extender: STONE III, Marce Schartz Weeks in Treatment: 4 Constitutional Well-nourished and well-hydrated in no acute distress. Respiratory normal breathing without difficulty. Psychiatric this patient is able to make decisions and demonstrates good insight into disease process. Alert and Oriented x 3. pleasant and cooperative. Notes Wound bed currently showed signs of excellent epithelization and there is no open wound or nothing draining at this time which is great news. Overall she has healed quite nicely and I am very pleased with regard to this. Electronic Signature(s) Signed: 03/22/2020 1:47:39 PM By: Lenda Kelp PA-C Entered By: Lenda Kelp on 03/22/2020 13:47:38 Debra [name] ([id number]) -------------------------------------------------------------------------------- Physician Orders Details Patient Name: Debra [name] Date of Service: 03/22/2020 12:30 PM Medical Record Number: [id number] Patient Account Number: [id number] Date of Birth/Sex: [birth year] (59 y.o. F) Treating RN: Curtis Sites Primary Care Provider: Danelle Berry Other Clinician: Referring Provider: Danelle Berry Treating Provider/Extender: Linwood Dibbles, [place] Verne Weeks in Treatment: 4 Verbal / Phone Orders: No Diagnosis Coding ICD-10 Coding Code Description L02.31 Cutaneous abscess of buttock E11.622 Type 2 diabetes mellitus with other skin ulcer L98.412 Non-pressure chronic ulcer of buttock with fat layer exposed Discharge From [facility] o Discharge from Wound Care Center Electronic  Signature(s) Signed: 03/22/2020 4:33:37 PM By: Curtis Sites Signed: 03/24/2020 5:53:18 PM By: Lenda Kelp PA-C Entered By: Curtis Sites on 03/22/2020 12:56:27 Debra [name] ([id number]) -------------------------------------------------------------------------------- Problem List Details Patient Name: Debra [name]. Date of Service: 03/22/2020 12:30 PM Medical Record Number: [id number] Patient Account Number: [id number] Date of Birth/Sex: 16-May-1961 (59 y.o. F) Treating RN: Curtis Sites Primary Care Provider: Danelle Berry Other Clinician: Referring Provider: Danelle Berry Treating Provider/Extender: Linwood Dibbles, Vadie Principato Weeks in Treatment: 4 Active Problems ICD-10 Evaluated Encounter Code Description Active Date Today Diagnosis L02.31 Cutaneous abscess of buttock 02/23/2020 No Yes E11.622 Type 2 diabetes mellitus with other skin ulcer 02/23/2020 No Yes L98.412 Non-pressure chronic ulcer of buttock with fat layer exposed 02/23/2020 No Yes Inactive Problems Resolved Problems Electronic Signature(s) Signed: 03/22/2020 12:51:47 PM By: Lenda Kelp PA-C Entered By: Lenda Kelp on 03/22/2020 12:51:46 Debra [name] ([id number]) -------------------------------------------------------------------------------- Progress Note Details Patient Name: Debra [name] Date of Service: 03/22/2020 12:30 PM Medical Record Number: [id number] Patient Account Number: [id number] Date of Birth/Sex: 11/27/1961 (59 y.o. 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 She tells me everything was fine up until a week ago. She noticed pain in her  left groin area. By Saturday this it  started to drain. She applied black salve to this area which she has done in the past and is helped heal problems in this area. This did not work. She was seen in the ER on 03/27/18 with an abscess in the left groin. Noted to have surrounding cellulitis and an indurated area. She underwent an IandD. She was started on patient clindamycin. Her white count was 11.9. Comprehensive metabolic panel normal lactic acid level normal at 1.2. They did not do a culture that I can see. She was seen yesterday at her primary physician's office she may have been put on cephalexin at that point. She is using iodoform packing The patient is a type II diabetic with a history of PAD last hemoglobin A1c of 7.3. She has a history of parkinsonism, COPD, bipolar disorder hypertension. She is a continued 1 pack per day smoker She tells me that she does have a history of recurrent abscesses in this area last about a year ago. Mostly she seems to care for these herself and they seem to go away. 04/06/18; patient's surgical IandD on the lower left abdominal quadrant just above the symphysis pubis appears better. Following is better we've been using silver alginate. Culture I did last week did not grow MRSA rather Enterococcus faecalis. I substituted Augmentin for the clindamycin however the patient didn't have enough money, however she will pick it up this morning 04/13/18; surgical IandD on the lower left abdominal quadrant just above the symphysis pubis. Surface of this area looks healthy. This is an oval-shaped wound lying horizontally. Medially it has A, all with about 0.9 cm in depth there is no drainage no surrounding erythema. She is completing the Augmentin I gave her last week 04/27/18; surgical IandD site on the lower left abdominal quadrant just above the symphysis pubis. Surface of this wound is improved dimensions are better. We've been using silver alginate and border foam. She  tells me she developed C. difficile has been treated with oral vancomycin. Will need to be very judicious with any further antibiotics 05/04/18; surgical IandD site on the lower left abdomen just above the symphysis pubis. All of this is closed over except a small slit like area which is probably where the sinus tract was. There is no drainage no tenderness. 05/11/18; surgical IandD site on the left lower abdomen just above the symphysis pubis. All of this is closed. There is no open area here. No drainage and no tenderness she does have a history of abscesses but apparently none that have had difficulty healing like this one READMISSION 11/09/18 This is a patient we know from previous stays in this clinic. She apparently has a history of recurrent Cutaneous abscesses. We had her in the clinic last time with a lower abdomen/inguinal area abscess that required a surgical IandD. We managed to get this to close over. She also lives in fear of recurrent C. difficile and does not want systemic antibiotics. She tells [place] that she developed a small abscess in the wound area about 2-3 weeks ago. This spontaneously ruptured and had purulent material maintained. She is here for our review of the resultant wound 11/23/2018; interestingly the area of the opened for which we saw her 2 weeks ago has closed. This was likely an abscess given the clinical description although we did not really see it at the beginning. Over the timeframe since we have seen here she developed a second area just underneath the original one from last week. As her friend describes  this it is a purple papule that is painful, develops purulence ruptures and then heals. Right now it is a flat open area. 12/14/2018; the patient has no open wound. By description these are said to be abscess these perform rupture and then formal wound. This is a second time we have had her for this. Readmission: 02/23/2020 patient presents for readmission in the  clinic here today concerning issues that she has been having with a right gluteal fold abscess. She was actually seen in urgent care where they actually did perform an incision and drainage. They also did place her on antibiotics as well. She is currently on doxycycline. With that being said the good news is this does not appear to be significantly infected although there is necrotic tissue I think we need to work on getting this cleaned out but nonetheless I do not see any signs of active local or systemic infection. She does have a history of diabetes mellitus type 2. 03/01/2020 on evaluation today patient appears to be doing better with regard to her wound. She is measuring much smaller compared to prior evaluation. Fortunately there is no signs of active infection at this time. No fevers, chills, nausea, vomiting, or diarrhea. Debra [name], Debra A. ([id number]) 03/08/2020 upon evaluation today patient appears to be doing well with regard to her wound in my opinion. She is showing some signs of improvement here which is excellent news. There is no evidence of active infection at this time. No fevers, chills, nausea, vomiting, or diarrhea. Overall I am extremely pleased with the progress that she is making. She did have an abscess area on the opposite side in the similar location that she notes popped up in the past couple of days. I did look at that as well that had a bandage on it there was really no significant drainage and did not appear to be thing actually open I believe this may be more of a hair bump type issue. With that being said I do think that she may have frequent folliculitis/abscesses that need to be addressed. I think that we may have some recommendations for her here. 03/14/20 upon evaluation today patient appears to be doing very well with regard to her wound. She has been tolerating the dressing changes without complication. Fortunately there is no signs of active infection at this time.  Overall I feel like this is progressing quite nicely. 03/22/2020 upon evaluation today patient appears to be doing extremely well with regard to her wound. In fact this appears to be completely healed which is great news. Fortunately there is no signs of active infection at this time. No fevers, chills, nausea, vomiting, or diarrhea. Objective Constitutional Well-nourished and well-hydrated in no acute distress. Vitals Time Taken: 12:35 PM, Height: 65 in, Weight: 293 lbs, BMI: 48.8, Temperature: 98.9 F, Pulse: 68 bpm, Respiratory Rate: 18 breaths/min, Blood Pressure: 145/78 mmHg. Respiratory normal breathing without difficulty. Psychiatric this patient is able to make decisions and demonstrates good insight into disease process. Alert and Oriented x 3. pleasant and cooperative. General Notes: Wound bed currently showed signs of excellent epithelization and there is no open wound or nothing draining at this time which is great news. Overall she has healed quite nicely and I am very pleased with regard to this. Integumentary (Hair, Skin) Wound #4 status is Healed - Epithelialized. Original cause of wound was Gradually Appeared. The wound is located on the Right Gluteal fold. The wound measures 0cm length x 0cm width x 0cm depth;  0cm^2 area and 0cm^3 volume. There is Fat Layer (Subcutaneous Tissue) Exposed exposed. There is no tunneling or undermining noted. There is a none present amount of drainage noted. The wound margin is well defined and not attached to the wound base. There is no granulation within the wound bed. There is a large (67-100%) amount of necrotic tissue within the wound bed including Adherent Slough. Assessment Active Problems ICD-10 Cutaneous abscess of buttock Type 2 diabetes mellitus with other skin ulcer Non-pressure chronic ulcer of buttock with fat layer exposed Plan Discharge From [facility]: Discharge from [facility] [place], Debra A. ([id number]) I would  recommend that she continue to put a dressing over this for the next week just allow the tissue to toughen up after that she can discontinue wearing the dressing at that point altogether. 2. I am and I discharged her she is completely healed currently I think that she has done very well and she hopefully will continue to do so. We will see the patient back for follow-up visit as needed. Electronic Signature(s) Signed: 03/22/2020 1:48:14 PM By: Lenda Kelp PA-C Entered By: Lenda Kelp on 03/22/2020 13:48:14 Debra [name] ([id number]) -------------------------------------------------------------------------------- SuperBill Details Patient Name: Debra [name] Date of Service: 03/22/2020 Medical Record Number: [id number] Patient Account Number: [id number] Date of Birth/Sex: [birth year] (59 y.o. F) Treating RN: Curtis Sites Primary Care Provider: Danelle Berry Other Clinician: Referring Provider: Danelle Berry Treating Provider/Extender: Linwood Dibbles, Adyn Serna Weeks in Treatment: 4 Diagnosis Coding ICD-10 Codes Code Description L02.31 Cutaneous abscess of buttock E11.622 Type 2 diabetes mellitus with other skin ulcer L98.412 Non-pressure chronic ulcer of buttock with fat layer exposed Facility Procedures CPT4 Code: [id number] Description: [id number] - WOUND CARE VISIT-LEV 2 EST PT Modifier: Quantity: 1 Physician Procedures CPT4 Code: [id number] Description: 99213 - WC PHYS LEVEL 3 - EST PT Modifier: Quantity: 1 CPT4 Code: Description: ICD-10 Diagnosis Description L02.31 Cutaneous abscess of buttock E11.622 Type 2 diabetes mellitus with other skin ulcer L98.412 Non-pressure chronic ulcer of buttock with fat layer exposed Modifier: Quantity: Electronic Signature(s) Signed: 03/22/2020 1:48:41 PM By: Lenda Kelp PA-C Entered By: Lenda Kelp on 03/22/2020 13:48:40

## 2020-03-28 NOTE — Progress Notes (Signed)
Debra [name], Debra [name] ([id number]) Visit Report for 03/22/2020 Arrival Information Details Patient Name: Debra [name], Debra [name]. Date of Service: 03/22/2020 12:30 PM Medical Record Number: [id number] Patient Account Number: [id number] Date of Birth/Sex: 01/16/1961 (59 y.o. F) Treating RN: Curtis Sites Primary Care Katilynn Sinkler: Danelle Berry Other Clinician: Referring Soren Pigman: Danelle Berry Treating Glendon Dunwoody/Extender: Linwood Dibbles, HOYT Weeks in Treatment: 4 Visit Information History Since Last Visit Added or deleted any medications: No Patient Arrived: Ambulatory Any new allergies or adverse reactions: No Arrival Time: 12:32 Had a fall or experienced change in No Accompanied By: self activities of daily living that may affect Transfer Assistance: None risk of falls: Patient Identification Verified: Yes Signs or symptoms of abuse/neglect since last visito No Secondary Verification Process Completed: Yes Hospitalized since last visit: No Patient Requires Transmission-Based Precautions: No Implantable device outside of the clinic excluding No Patient Has Alerts: No cellular tissue based products placed in the center since last visit: Has Dressing in Place as Prescribed: Yes Pain Present Now: No Electronic Signature(s) Signed: 03/22/2020 4:24:29 PM By: Dayton Martes RCP, RRT, CHT Entered By: Dayton Martes on 03/22/2020 12:34:27 Debra [name] ([id number]) -------------------------------------------------------------------------------- Clinic Level of Care Assessment Details Patient Name: Debra [name] Date of Service: 03/22/2020 12:30 PM Medical Record Number: [id number] Patient Account Number: [id number] Date of Birth/Sex: [birth year] (59 y.o. F) Treating RN: Curtis Sites Primary Care Lucilia Yanni: Danelle Berry Other Clinician: Referring Makena Murdock: Danelle Berry Treating Sadrac Zeoli/Extender: Linwood Dibbles, HOYT Weeks in Treatment: 4 Clinic Level of Care Assessment Items TOOL 4 Quantity  Score X - Use when only an EandM is performed on FOLLOW-UP visit 1 0 ASSESSMENTS - Nursing Assessment / Reassessment X - Reassessment of Co-morbidities (includes updates in patient status) 1 10 X- 1 5 Reassessment of Adherence to Treatment Plan ASSESSMENTS - Wound and Skin Assessment / Reassessment X - Simple Wound Assessment / Reassessment - one wound 1 5 []  - 0 Complex Wound Assessment / Reassessment - multiple wounds []  - 0 Dermatologic / Skin Assessment (not related to wound area) ASSESSMENTS - Focused Assessment []  - Circumferential Edema Measurements - multi extremities 0 []  - 0 Nutritional Assessment / Counseling / Intervention []  - 0 Lower Extremity Assessment (monofilament, tuning fork, pulses) []  - 0 Peripheral Arterial Disease Assessment (using hand held doppler) ASSESSMENTS - Ostomy and/or Continence Assessment and Care []  - Incontinence Assessment and Management 0 []  - 0 Ostomy Care Assessment and Management (repouching, etc.) PROCESS - Coordination of Care X - Simple Patient / Family Education for ongoing care 1 15 []  - 0 Complex (extensive) Patient / Family Education for ongoing care []  - 0 Staff obtains , Records, Test Results / Process Orders []  - 0 Staff telephones HHA, Nursing Homes / Clarify orders / etc []  - 0 Routine Transfer to another Facility (non-emergent condition) []  - 0 Routine Hospital Admission (non-emergent condition) []  - 0 New Admissions / / Ordering NPWT, Apligraf, etc. []  - 0 Emergency Hospital Admission (emergent condition) X- 1 10 Simple Discharge Coordination []  - 0 Complex (extensive) Discharge Coordination PROCESS - Special Needs []  - Pediatric / Minor Patient Management 0 []  - 0 Isolation Patient Management []  - 0 Hearing / Language / Visual special needs []  - 0 Assessment of Community assistance (transportation, D/C planning, etc.) NAKYRA, BOURN ( ) []  - 0 Additional assistance /  Altered mentation []  - 0 Support Surface(s) Assessment (bed, cushion, seat, etc.) INTERVENTIONS - Wound Cleansing / Measurement X - Simple Wound Cleansing - one wound 1  5 []  - 0 Complex Wound Cleansing - multiple wounds []  - 0 Wound Imaging (photographs - any number of wounds) X- 1 5 Wound Tracing (instead of photographs) X- 1 5 Simple Wound Measurement - one wound []  - 0 Complex Wound Measurement - multiple wounds INTERVENTIONS - Wound Dressings []  - Small Wound Dressing one or multiple wounds 0 []  - 0 Medium Wound Dressing one or multiple wounds []  - 0 Large Wound Dressing one or multiple wounds []  - 0 Application of Medications - topical []  - 0 Application of Medications - injection INTERVENTIONS - Miscellaneous []  - External ear exam 0 []  - 0 Specimen Collection (cultures, biopsies, blood, body fluids, etc.) []  - 0 Specimen(s) / Culture(s) sent or taken to Lab for analysis []  - 0 Patient Transfer (multiple staff / Civil Service fast streamer / Similar devices) []  - 0 Simple Staple / Suture removal (25 or less) []  - 0 Complex Staple / Suture removal (26 or more) []  - 0 Hypo / Hyperglycemic Management (close monitor of Blood Glucose) []  - 0 Ankle / Brachial Index (ABI) - do not check if billed separately []  - 0 Vital Signs Has the patient been seen at the hospital within the last three years: Yes Total Score: 60 Level Of Care: New/Established - Level 2 Electronic Signature(s) Signed: 03/22/2020 4:33:37 PM By: Montey Hora Entered By: Montey Hora on 03/22/2020 12:57:19 Debra [name] ([id number]) -------------------------------------------------------------------------------- Encounter Discharge Information Details Patient Name: Debra [name]. Date of Service: 03/22/2020 12:30 PM Medical Record Number: [id number] Patient Account Number: [id number] Date of Birth/Sex: 05-08-1961 (59 y.o. F) Treating RN: Montey Hora Primary Care Ceasia Elwell: Delsa Grana Other Clinician: Referring  Zakeria Kulzer: Delsa Grana Treating Sheenah Dimitroff/Extender: Melburn Hake, HOYT Weeks in Treatment: 4 Encounter Discharge Information Items Discharge Condition: Stable Ambulatory Status: Ambulatory Discharge Destination: Home Transportation: Private Auto Schedule Follow-up Appointment: No Clinical Summary of Care: Electronic Signature(s) Signed: 03/22/2020 4:33:37 PM By: Montey Hora Entered By: Montey Hora on 03/22/2020 12:58:05 Debra [name] ([id number]) -------------------------------------------------------------------------------- Lower Extremity Assessment Details Patient Name: Debra [name] Date of Service: 03/22/2020 12:30 PM Medical Record Number: [id number] Patient Account Number: [id number] Date of Birth/Sex: Sep 10, 1961 (59 y.o. F) Treating RN: Cornell Barman Primary Care Jaymee Tilson: Delsa Grana Other Clinician: Referring Merrie Epler: Delsa Grana Treating Conswella Bruney/Extender: Worthy Keeler Weeks in Treatment: 4 Electronic Signature(s) Signed: 03/28/2020 5:10:21 PM By: Gretta Cool, BSN, RN, CWS, Naziyah RN, BSN Entered By: Gretta Cool, BSN, RN, CWS, Canna on 03/22/2020 12:46:11 Debra [name] ([id number]) -------------------------------------------------------------------------------- Multi Wound Chart Details Patient Name: Debra [name] Date of Service: 03/22/2020 12:30 PM Medical Record Number: [id number] Patient Account Number: [id number] Date of Birth/Sex: [birth year] (59 y.o. F) Treating RN: Montey Hora Primary Care Chae Oommen: Delsa Grana Other Clinician: Referring Bently Wyss: Delsa Grana Treating Gay Rape/Extender: Melburn Hake, HOYT Weeks in Treatment: 4 Vital Signs Height(in): 65 Pulse(bpm): 68 Weight(lbs): 293 Blood Pressure(mmHg): 145/78 Body Mass Index(BMI): 49 Temperature(F): 98.9 Respiratory Rate(breaths/min): 18 Photos: [N/A:N/A] Wound Location: Right Gluteal fold N/A N/A Wounding Event: Gradually Appeared N/A N/A Primary Etiology: Abscess N/A N/A Comorbid History: Hypertension, Type II  Diabetes, N/A N/A Osteoarthritis Date Acquired: 02/16/2020 N/A N/A Weeks of Treatment: 4 N/A N/A Wound Status: Healed - Epithelialized N/A N/A Measurements L x W x D (cm) 0x0x0 N/A N/A Area (cm) : 0 N/A N/A Volume (cm) : 0 N/A N/A % Reduction in Area: 100.00% N/A N/A % Reduction in Volume: 100.00% N/A N/A Classification: Full Thickness Without Exposed N/A N/A Support Structures Exudate Amount: None Present N/A N/A Wound Margin:  Well defined, not attached N/A N/A Granulation Amount: None Present (0%) N/A N/A Necrotic Amount: Large (67-100%) N/A N/A Exposed Structures: Fat Layer (Subcutaneous Tissue) N/A N/A Exposed: Yes Fascia: No Tendon: No Muscle: No Joint: No Bone: No Epithelialization: None N/A N/A Treatment Notes Electronic Signature(s) Signed: 03/22/2020 4:33:37 PM By: Curtis Sites Entered By: Curtis Sites on 03/22/2020 12:56:03 Debra [name] ([id number]) -------------------------------------------------------------------------------- Multi-Disciplinary Care Plan Details Patient Name: Debra [name]. Date of Service: 03/22/2020 12:30 PM Medical Record Number: [id number] Patient Account Number: [id number] Date of Birth/Sex: 12/11/1960 (59 y.o. F) Treating RN: Curtis Sites Primary Care Hendryx Ricke: Danelle Berry Other Clinician: Referring Sharlot Sturkey: Danelle Berry Treating Darshana Curnutt/Extender: Linwood Dibbles, HOYT Weeks in Treatment: 4 Active Inactive Electronic Signature(s) Signed: 03/22/2020 4:33:37 PM By: Curtis Sites Entered By: Curtis Sites on 03/22/2020 12:58:50 Debra [name] ([id number]) -------------------------------------------------------------------------------- Pain Assessment Details Patient Name: Debra [name] Date of Service: 03/22/2020 12:30 PM Medical Record Number: [id number] Patient Account Number: [id number] Date of Birth/Sex: 02-03-1961 (59 y.o. F) Treating RN: Huel Coventry Primary Care Josslin Sanjuan: Danelle Berry Other Clinician: Referring Elimelech Houseman: Danelle Berry Treating Arda Keadle/Extender: Linwood Dibbles, HOYT Weeks in Treatment: 4 Active Problems Location of Pain Severity and Description of Pain Patient Has Paino No Site Locations Pain Management and Medication Current Pain Management: Notes Patient denies pain at this time. Electronic Signature(s) Signed: 03/28/2020 5:10:21 PM By: Elliot Gurney, BSN, RN, CWS, Aleyah RN, BSN Entered By: Elliot Gurney, BSN, RN, CWS, Laylee on 03/22/2020 12:41:06 Debra [name] ([id number]) -------------------------------------------------------------------------------- Patient/Caregiver Education Details Patient Name: Debra [name] Date of Service: 03/22/2020 12:30 PM Medical Record Number: [id number] Patient Account Number: [id number] Date of Birth/Gender: 04/11/1961 (59 y.o. F) Treating RN: Curtis Sites Primary Care Physician: Danelle Berry Other Clinician: Referring Physician: Danelle Berry Treating Physician/Extender: Skeet Simmer in Treatment: 4 Education Assessment Education Provided To: Patient Education Topics Provided Wound/Skin Impairment: Handouts: Skin Care Do's and Dont's Methods: Explain/Verbal Responses: State content correctly Electronic Signature(s) Signed: 03/22/2020 4:33:37 PM By: Curtis Sites Entered By: Curtis Sites on 03/22/2020 12:57:41 Debra [name] ([id number]) -------------------------------------------------------------------------------- Wound Assessment Details Patient Name: Debra [name] Date of Service: 03/22/2020 12:30 PM Medical Record Number: [id number] Patient Account Number: [id number] Date of Birth/Sex: [birth year] (59 y.o. F) Treating RN: Curtis Sites Primary Care Tyrisha Benninger: Danelle Berry Other Clinician: Referring Iver Fehrenbach: Danelle Berry Treating Juliauna Stueve/Extender: Linwood Dibbles, HOYT Weeks in Treatment: 4 Wound Status Wound Number: 4 Primary Etiology: Abscess Wound Location: Right Gluteal fold Wound Status: Healed - Epithelialized Wounding Event: Gradually Appeared Comorbid  History: Hypertension, Type II Diabetes, Osteoarthritis Date Acquired: 02/16/2020 Weeks Of Treatment: 4 Clustered Wound: No Photos Wound Measurements Length: (cm) 0 Width: (cm) 0 Depth: (cm) 0 Area: (cm) 0 Volume: (cm) 0 % Reduction in Area: 100% % Reduction in Volume: 100% Epithelialization: None Tunneling: No Undermining: No Wound Description Classification: Full Thickness Without Exposed Support Structures Wound Margin: Well defined, not attached Exudate Amount: None Present Foul Odor After Cleansing: No Slough/Fibrino Yes Wound Bed Granulation Amount: None Present (0%) Exposed Structure Necrotic Amount: Large (67-100%) Fascia Exposed: No Necrotic Quality: Adherent Slough Fat Layer (Subcutaneous Tissue) Exposed: Yes Tendon Exposed: No Muscle Exposed: No Joint Exposed: No Bone Exposed: No Electronic Signature(s) Signed: 03/22/2020 4:33:37 PM By: Curtis Sites Entered By: Curtis Sites on 03/22/2020 12:55:45 Debra [name] ([id number]) -------------------------------------------------------------------------------- Vitals Details Patient Name: Debra [name]. Date of Service: 03/22/2020 12:30 PM Medical Record Number: [id number] Patient Account Number: [id number] Date of Birth/Sex: [birth year] (59 y.o. F) Treating RN: Curtis Sites Primary Care Jase Himmelberger:  TAPIA, LEISA Other Clinician: Referring Kielee Care: Danelle Berry Treating January Bergthold/Extender: STONE III, HOYT Weeks in Treatment: 4 Vital Signs Time Taken: 12:35 Temperature (F): 98.9 Height (in): 65 Pulse (bpm): 68 Weight (lbs): 293 Respiratory Rate (breaths/min): 18 Body Mass Index (BMI): 48.8 Blood Pressure (mmHg): 145/78 Reference Range: 80 - 120 mg / dl Electronic Signature(s) Signed: 03/22/2020 4:24:29 PM By: Dayton Martes RCP, RRT, CHT Entered By: Weyman Rodney, Lucio Edward on 03/22/2020 12:36:49

## 2020-04-07 ENCOUNTER — Encounter: Payer: Self-pay | Admitting: Family Medicine

## 2020-04-09 ENCOUNTER — Ambulatory Visit: Payer: Medicare Other | Attending: Internal Medicine

## 2020-04-09 DIAGNOSIS — Z23 Encounter for immunization: Secondary | ICD-10-CM

## 2020-04-09 NOTE — Progress Notes (Signed)
   Covid-19 Vaccination Clinic  Name:  Debra [name]    MRN: [id number] DOB: 10/10/1961  04/09/2020  [name] was observed post Covid-19 immunization for 15 minutes without incident. She was provided with Vaccine Information Sheet and instruction to access the V-Safe system.   [name] was instructed to call 911 with any severe reactions post vaccine: [name] Difficulty breathing  . Swelling of face and throat  . A fast heartbeat  . A bad rash all over body  . Dizziness and weakness   Immunizations Administered    Name Date Dose VIS Date Route   Pfizer COVID-19 Vaccine 04/09/2020  9:46 AM 0.3 mL 01/31/2019 Intramuscular   Manufacturer: [employer], [employer]   Lot: [id number]   NDC: [address]-7

## 2020-04-22 ENCOUNTER — Telehealth: Payer: Self-pay | Admitting: *Deleted

## 2020-04-22 NOTE — Telephone Encounter (Signed)
CaseId:[id number];Status:Denied;Review Type:Qty;Appeal Information: Attention:ATTN: CLINICAL APPEALS DEPARTMENT [facility] [id number]. [phone] [phone] Fax:[phone]; Important - Please read the below note on eAppeals: Please reference the denial letter for information on the rights for an appeal, rationale for the denial, and how to submit an appeal including if any information is needed to support the appeal. Note about urgent situations - Generally, an urgent situation is one which, in the opinion of the provider, the health of the patient may be in serious jeopardy or may experience pain that cannot be adequately controlled while waiting for a decision on the appeal.  Appeal will be faxed to our office.

## 2020-04-22 NOTE — Telephone Encounter (Signed)
Denial/Appeal letter placed in [facility].

## 2020-04-22 NOTE — Telephone Encounter (Signed)
Pt requiring PA for Repatha 140 mg/ml. PA has been submitted through CoverMyMeds. Awaiting Approval.  [facility] is reviewing your PA request and will respond within 24 hours for Medicaid or up to 72 hours for non-Medicaid plans, based on the required timeframe determined by state or federal regulations.

## 2020-04-23 NOTE — Telephone Encounter (Signed)
Rx was sent in for a quantity of 6 pens for a 90 day supply. Denial letter states there is a quantity limitation of 2 pens per 28 days. Spoke with Luther Parody at [facility] and requested that she run the prescription through for a qty of 2 pens to see if it would go through. She states it was accepted however she does not have this medication in stock so she will get it ordered and notify the patient when it comes in within the next few days.

## 2020-04-25 ENCOUNTER — Other Ambulatory Visit: Payer: Self-pay | Admitting: Family Medicine

## 2020-04-28 ENCOUNTER — Other Ambulatory Visit: Payer: Self-pay | Admitting: Psychiatry

## 2020-04-28 DIAGNOSIS — F4310 Post-traumatic stress disorder, unspecified: Secondary | ICD-10-CM

## 2020-05-02 LAB — HM DIABETES EYE EXAM

## 2020-05-03 ENCOUNTER — Telehealth (INDEPENDENT_AMBULATORY_CARE_PROVIDER_SITE_OTHER): Payer: 59 | Admitting: Psychiatry

## 2020-05-03 ENCOUNTER — Other Ambulatory Visit: Payer: Self-pay

## 2020-05-03 ENCOUNTER — Encounter: Payer: Self-pay | Admitting: Psychiatry

## 2020-05-03 DIAGNOSIS — F4310 Post-traumatic stress disorder, unspecified: Secondary | ICD-10-CM | POA: Diagnosis not present

## 2020-05-03 DIAGNOSIS — F17200 Nicotine dependence, unspecified, uncomplicated: Secondary | ICD-10-CM

## 2020-05-03 DIAGNOSIS — F3178 Bipolar disorder, in full remission, most recent episode mixed: Secondary | ICD-10-CM

## 2020-05-03 MED ORDER — PAROXETINE HCL 40 MG PO TABS: ORAL_TABLET | ORAL | 1 refills | Status: DC

## 2020-05-03 NOTE — Progress Notes (Addendum)
Provider Location : [facility] Patient Location : Home  Virtual Visit via Video Note  I connected with Debra [name] on 05/03/20 at  9:00 AM EDT by a video enabled telemedicine application and verified that I am speaking with the correct person using two identifiers.   I discussed the limitations of evaluation and management by telemedicine and the availability of in person appointments. The patient expressed understanding and agreed to proceed.    I discussed the assessment and treatment plan with the patient. The patient was provided an opportunity to ask questions and all were answered. The patient agreed with the plan and demonstrated an understanding of the instructions.   The patient was advised to call back or seek an in-person evaluation if the symptoms worsen or if the condition fails to improve as anticipated.  [place] MD OP Progress Note  05/03/2020 9:16 AM Debra [name]  MRN:  [id number]  Chief Complaint:  Chief Complaint    Follow-up     HPI: Debra [name] is a 59 year old Caucasian female, married, disability, lives in [place], has a history of bipolar disorder, PTSD, tobacco use disorder, parkinsonian tremors, chronic back pain, OSA, hypertension, possible REM sleep behavior disorder was evaluated by telemedicine today.  Patient today reports she is currently doing well with regards to her mood.  She denies any mood swings.  She reports sleep continues to be good.  She continues to have tremors which she reports could be worsening.  She however reports she has upcoming appointment with neurology for the same.  She continues to take propranolol.  She denies any suicidality, homicidality or perceptual disturbances.  She reports she is currently trying to cut back on smoking.  She wants to continue to do the same.  She reports she has family over for [facility] Day weekend and looks forward to having a good time.  Patient denies any other concerns today.     Visit Diagnosis:     ICD-10-CM   1. Bipolar disorder, in full remission, most recent episode mixed (HCC)  F31.78 PARoxetine (PAXIL) 40 MG tablet  2. PTSD (post-traumatic stress disorder)  F43.10   3. Tobacco use disorder  F17.200     Past Psychiatric History: I have reviewed past history from my progress note on 02/16/2019.  Past trials of Paxil, Depakote, Haldol, trazodone, melatonin.    Past Medical History:  Past Medical History:  Diagnosis Date  . Anxiety   . Arthritis   . C. difficile colitis   . C. difficile colitis   . Depression   . Diabetes mellitus without complication ([place])    Type II  . Diabetes mellitus, type II ([place])   . Family history of adverse reaction to anesthesia    Mother - BP drops  . Head injury, closed, with brief LOC ([place]) 2011ish  . Hyperlipidemia   . Hypertension   . Insomnia   . Myalgia 03/29/2018   Due to statin  . Obesity   . Parkinson's disease ([place])   . Seasonal allergies   . Seizures ([place])   . Tobacco abuse     Past Surgical History:  Procedure Laterality Date  . ABDOMINAL HYSTERECTOMY  2012  . BREAST BIOPSY Right   . CESAREAN SECTION  1992  . CHOLECYSTECTOMY  2013  . LUMBAR LAMINECTOMY/DECOMPRESSION MICRODISCECTOMY Left 09/10/2015   Procedure: Left L4-5 Foraminotomy/Left L5-S1 Diskectomy;  Surgeon: Leeroy Cha, MD;  Location: [facility];  Service: Neurosurgery;  Laterality: Left;  Left L4-5 Foraminotomy/Left L5-S1 Diskectomy  Family Psychiatric History: I have reviewed family psychiatric history from my progress note on 02/16/2019  Family History:  Family History  Problem Relation Age of Onset  . COPD Mother   . Bipolar disorder Father   . Diabetes Mellitus II Father   . Bipolar disorder Brother   . Diabetes Mellitus II Brother   . Bipolar disorder Brother     Social History: I have reviewed social history from my progress note on 02/16/2019 Social History   Socioeconomic History  . Marital status: Married    Spouse name: [name]  . Number  of children: 1  . Years of education: Not on file  . Highest education level: Associate degree: occupational, Hotel manager, or vocational program  Occupational History  . Occupation: Disabled  Tobacco Use  . Smoking status: Current Every Day Smoker    Packs/day: 0.50    Years: 24.00    Pack years: 12.00    Types: Cigarettes    Start date: 03/19/2004  . Smokeless tobacco: Never Used  Substance and Sexual Activity  . Alcohol use: Yes    Alcohol/week: 0.0 standard drinks    Comment: occasional- rare  . Drug use: No  . Sexual activity: Not Currently  Other Topics Concern  . Not on file  Social History Narrative   Live in private residence w/ husband and their friend who is wheelchair bound, her end of the house is wheelchair accessible.    Social Determinants of Health   Financial Resource Strain: Low Risk   . Difficulty of Paying Living Expenses: Not hard at all  Food Insecurity: Food Insecurity Present  . Worried About Charity fundraiser in the Last Year: Sometimes true  . Ran Out of Food in the Last Year: Never true  Transportation Needs:   . Lack of Transportation (Medical):   [name] Lack of Transportation (Non-Medical):   Physical Activity:   . Days of Exercise per Week:   . Minutes of Exercise per Session:   Stress: Stress Concern Present  . Feeling of Stress : To some extent  Social Connections: Unknown  . Frequency of Communication with Friends and Family: More than three times a week  . Frequency of Social Gatherings with Friends and Family: Not on file  . Attends Religious Services: Not on file  . Active Member of Clubs or Organizations: Not on file  . Attends Archivist Meetings: Not on file  . Marital Status: Not on file    Allergies:  Allergies  Allergen Reactions  . Clindamycin/Lincomycin     Pt states caused C-Diff  . Montelukast Hives and Rash    Asthma attacks Asthma attacks  . Sulfasalazine Hives    Other reaction(s): Unknown  . Metformin And  Related Diarrhea  . Benadryl [Diphenhydramine Hcl] Swelling    blistering  . Canagliflozin Other (See Comments)    Other reaction(s): Other (See Comments) Low bp Low bp Low bp  . Depakote Er [Divalproex Sodium Er] Other (See Comments)    seizures  . Dilaudid [Hydromorphone Hcl] Other (See Comments)    seizures  . Diphenhydramine-Zinc Acetate Other (See Comments)  . Erythromycin   . Ethanol   . Haloperidol Other (See Comments)  . Hydromorphone Other (See Comments)  . Neosporin [Neomycin-Bacitracin Zn-Polymyx] Swelling    blistering  . Singulair [Montelukast Sodium] Other (See Comments)    Asthma attacks  . Sitagliptin Other (See Comments)  . Statins Other (See Comments)    Other reaction(s): Unknown  . Sulfa  Antibiotics Hives    Other reaction(s): Unknown  . Victoza [Liraglutide] Other (See Comments) and Nausea And Vomiting    Other reaction(s): Other (See Comments) pancreatitis pancreatitis    Metabolic Disorder Labs: Lab Results  Component Value Date   HGBA1C 8.3 (H) 09/21/2019   MPG 192 09/21/2019   MPG 214 06/16/2019   No results found for: PROLACTIN Lab Results  Component Value Date   CHOL 98 09/21/2019   TRIG 167 (H) 09/21/2019   HDL 49 (L) 09/21/2019   CHOLHDL 2.0 09/21/2019   LDLCALC 24 09/21/2019   LDLCALC 49 06/16/2019   Lab Results  Component Value Date   TSH 1.671 02/16/2019   TSH 2.160 08/20/2015    Therapeutic Level Labs: No results found for: LITHIUM No results found for: VALPROATE No components found for:  CBMZ  Current Medications: Current Outpatient Medications  Medication Sig Dispense Refill  . Continuous Blood Gluc Receiver (FREESTYLE LIBRE 14 DAY READER) DEVI 1 Device.    . Continuous Blood Gluc Sensor (FREESTYLE LIBRE 14 DAY SENSOR) MISC 1 kit.    [name] aspirin EC 81 MG tablet Take 81 mg by mouth daily.    . blood glucose meter kit and supplies Use up to four times daily as directed, Dx E11.65, LON 99 months 1 each 0  . calcium  carbonate (OSCAL) 1500 (600 Ca) MG TABS tablet Take by mouth.    . Calcium Carbonate-Vitamin D 600-200 MG-UNIT TABS Take by mouth.    . Cholecalciferol 5000 units capsule Take 5,000 Units by mouth.     . clonazePAM (KLONOPIN) 0.5 MG tablet Take by mouth.    . clonazePAM (KLONOPIN) 0.5 MG tablet Take 1 tablet (0.5 mg total) by mouth as directed. Take one tablet once a day as needed up to 2-3 times a week only for severe anxiety attacks 12 tablet 0  . clotrimazole-betamethasone (LOTRISONE) cream Apply 1 application topically 2 (two) times daily. Sparingly to affected skin for up to 7 d at a time 30 g 1  . doxycycline (VIBRAMYCIN) 100 MG capsule Take 1 capsule (100 mg total) by mouth 2 (two) times daily. 20 capsule 0  . Evolocumab (REPATHA SURECLICK) 938 MG/ML SOAJ Inject 140 mg into the skin every 14 (fourteen) days. 6 pen 0  . FARXIGA 10 MG TABS tablet TAKE 1 TABLET DAILY (HIGHER DOSE) 90 tablet 3  . fluconazole (DIFLUCAN) 150 MG tablet Take 1 tablet (150 mg total) by mouth every 3 (three) days as needed (for vaginal itching/yeast infection sx). 2 tablet 4  . folic acid (FOLVITE) 182 MCG tablet Take 800 mcg by mouth daily.     [name] glucose blood (ONE TOUCH ULTRA TEST) test strip USE UP TO FOUR TIMES A DAY AS DIRECTED; Dx 11.51, LON 99 months 300 each 3  . HUMALOG KWIKPEN 100 UNIT/ML KwikPen Give subcutaneously with meals, 20 units in the AM and 35 units with lunch and evening meal 27 pen 5  . hydrochlorothiazide (HYDRODIURIL) 25 MG tablet TAKE 1 TABLET DAILY (DOSE INCREASE, REPLACES ALL OTHER HCTZ PRESCRIPTIONS) 90 tablet 3  . HYDROcodone-acetaminophen (NORCO/VICODIN) 5-325 MG tablet TAKE 1 2 TABLETS BY MOUTH EVERY 6 HOURS AS NEEDED FOR PAIN    . Insulin Pen Needle 29G X 12.7MM MISC 1 each by Does not apply route as needed. 100 each 3  . lamoTRIgine (LAMICTAL) 100 MG tablet TAKE ONE AND ONE-HALF TABLETS DAILY (DOSE INCREASE) 135 tablet 3  . Lancets (ONETOUCH DELICA PLUS [id number]) [place]  BLOOD SUGARS FOUR TIMES A DAY 300 each 3  . lisinopril (ZESTRIL) 40 MG tablet TAKE 1 TABLET DAILY 90 tablet 0  . Melatonin 10 MG TABS Take 1 tablet by mouth at bedtime.    . Multiple Vitamin (MULTI-VITAMIN) tablet Take by mouth.    . Multiple Vitamins-Minerals (CENTRUM WOMEN) TABS Take 1 tablet by mouth.    [name] PARoxetine (PAXIL) 40 MG tablet TAKE 1 TABLET EVERY MORNING 90 tablet 1  . pioglitazone (ACTOS) 30 MG tablet TAKE 1 TABLET DAILY 90 tablet 0  . pregabalin (LYRICA) 25 MG capsule Take 1 capsule (25 mg total) by mouth 2 (two) times daily. 60 capsule 3  . propranolol (INNOPRAN XL) 80 MG 24 hr capsule TAKE 1 CAPSULE DAILY    . TRESIBA FLEXTOUCH 200 UNIT/ML SOPN Inject 120 Units into the skin at bedtime. (Patient taking differently: Inject 120 Units into the skin every morning. ) 36 pen 5   No current facility-administered medications for this visit.     Musculoskeletal: Strength & Muscle Tone: UTA Gait & Station: normal Patient leans: N/A  Psychiatric Specialty Exam: Review of Systems  Neurological: Positive for tremors.  All other systems reviewed and are negative.   There were no vitals taken for this visit.There is no height or weight on file to calculate BMI.  General Appearance: Casual  Eye Contact:  Fair  Speech:  Clear and Coherent  Volume:  Normal  Mood:  Euthymic  Affect:  Appropriate  Thought Process:  Goal Directed and Descriptions of Associations: Intact  Orientation:  Full (Time, Place, and Person)  Thought Content: Logical   Suicidal Thoughts:  No  Homicidal Thoughts:  No  Memory:  Immediate;   Fair Recent;   Fair Remote;   Fair  Judgement:  Fair  Insight:  Fair  Psychomotor Activity:  Normal  Concentration:  Concentration: Fair and Attention Span: Fair  Recall:  [employer] of Knowledge: Fair  Language: Fair  Akathisia:  No  Handed:  Right  AIMS (if indicated): UTA  Assets:  Communication Skills Desire for Improvement Housing  ADL's:  Intact   Cognition: WNL  Sleep:  Good   Screenings: Mini-Mental     [facility] Visit from 09/21/2016 in [facility]  Total Score (max 30 points )  27    PHQ2-9     [facility] Visit from 02/16/2020 in [facility] Video Visit from 12/06/2019 in [facility] Office Visit from 09/21/2019 in [facility] Office Visit from 09/07/2019 in [facility] Office Visit from 06/13/2019 in [facility]  PHQ-2 Total Score  5  0  0  0  0  PHQ-9 Total Score  11  0  0  0  0       Assessment and Plan:  Debra [name] is a 59 year old Caucasian female, married, disabled, lives in [place], has a history of bipolar disorder, PTSD, hypertension, diabetes melitis, OSA on CPAP, chronic pain was evaluated by telemedicine today.  Patient is biologically predisposed given her history of trauma, family history of mental health problems.  Patient with psychosocial stressors of the pandemic, mother's health issues, her own health problems, financial problems.  Patient is currently making progress.  Plan as noted below.  Plan Bipolar disorder in remission Lamotrigine 150 mg p.o. daily Paxil 40 mg p.o. daily Klonopin 0.5 mg p.o. as needed 2-3 times a week for severe anxiety attacks only. I have reviewed [place] controlled  substance database  PTSD-stable Paxil as prescribed Patient to establish care with therapist and continue CBT-patient advised to call the clinic to make an appointment.   Insomnia-stable Melatonin as prescribed Continue CPAP for OSA  Tobacco use disorder-improving Provided smoking cessation counseling. Patient is motivated to cut back  Patient to continue to follow-up with neurology for tremors.  Follow-up in clinic in 2 to 3 months or sooner if needed.  I have spent atleast 20 minutes non face to face with patient today. More than 50 % of the time was spent for preparing to see the patient ( e.g.,  review of test, records ),  ordering medications and test ,psychoeducation and supportive psychotherapy and care coordination,as well as documenting clinical information in electronic health record. This note was generated in part or whole with voice recognition software. Voice recognition is usually quite accurate but there are transcription errors that can and very often do occur. I apologize for any typographical errors that were not detected and corrected.         Ursula Alert, MD 05/03/2020, 9:16 AM

## 2020-05-08 ENCOUNTER — Ambulatory Visit
Admission: EM | Admit: 2020-05-08 | Discharge: 2020-05-08 | Disposition: A | Discharge status: Home / Self Care | Payer: Managed Care, Other (non HMO) | Attending: Family Medicine | Admitting: Family Medicine

## 2020-05-08 ENCOUNTER — Encounter: Payer: Self-pay | Admitting: Emergency Medicine

## 2020-05-08 ENCOUNTER — Other Ambulatory Visit: Payer: Self-pay

## 2020-05-08 DIAGNOSIS — R300 Dysuria: Secondary | ICD-10-CM | POA: Diagnosis present

## 2020-05-08 DIAGNOSIS — R350 Frequency of micturition: Secondary | ICD-10-CM | POA: Diagnosis present

## 2020-05-08 DIAGNOSIS — R3911 Hesitancy of micturition: Secondary | ICD-10-CM | POA: Diagnosis present

## 2020-05-08 LAB — POCT URINALYSIS DIP (MANUAL ENTRY)
Bilirubin, UA: NEGATIVE
Glucose, UA: 500 mg/dL — AB
Ketones, POC UA: NEGATIVE mg/dL
Nitrite, UA: NEGATIVE
Protein Ur, POC: NEGATIVE mg/dL
Spec Grav, UA: 1.02 (ref 1.010–1.025)
Urobilinogen, UA: 0.2 E.U./dL
pH, UA: 7.5 (ref 5.0–8.0)

## 2020-05-08 MED ORDER — METHOCARBAMOL 500 MG PO TABS
500.0000 mg | ORAL_TABLET | Freq: Two times a day (BID) | ORAL | 0 refills | Status: DC
Start: 2020-05-08 — End: 2020-12-13

## 2020-05-08 MED ORDER — PHENAZOPYRIDINE HCL 200 MG PO TABS
200.0000 mg | ORAL_TABLET | Freq: Three times a day (TID) | ORAL | 0 refills | Status: DC
Start: 2020-05-08 — End: 2020-12-13

## 2020-05-08 NOTE — Discharge Instructions (Addendum)
You may have a urinary tract infection. We are going to culture your urine and will call you as soon as we have the results.   Drink plenty of water, 8-10 glasses per day.   You may take  the pyridium as prescribed  I have also sent in a muscle relaxer for you to use for spasms  Follow up with your primary care provider as needed.   Go to the Emergency Department if you experience severe pain, shortness of breath, high fever, or other concerns.

## 2020-05-08 NOTE — ED Triage Notes (Signed)
Patient in today c/o urinary frequency and dysuria x 2 days. Patient states she had the same symptoms ~ 1.5 weeks ago, but they resolved. Patient has not tried any OTC medications.

## 2020-05-08 NOTE — ED Provider Notes (Signed)
[facility]   CC: UTI  SUBJECTIVE:  Debra [name] is a 59 y.o. female who complains of urinary frequency, urgency and dysuria for the past 2 days. Reports that she had a day of similar symptoms last week. Patient denies a precipitating event, recent sexual encounter, excessive caffeine intake. Localizes the pain to the lower abdomen.  Pain is intermittent and describes it as burning. Has tried OTC medications without relief. Symptoms are made worse with urination. Admits to similar symptoms in the past. Denies fever, chills, nausea, vomiting, flank pain, abnormal vaginal discharge or bleeding, hematuria.    LMP: No LMP recorded. Patient has had a hysterectomy.  ROS: As in HPI.  All other pertinent ROS negative.     Past Medical History:  Diagnosis Date  . Anxiety   . Arthritis   . C. difficile colitis   . C. difficile colitis   . Depression   . Diabetes mellitus without complication ([place])    Type II  . Diabetes mellitus, type II ([place])   . Family history of adverse reaction to anesthesia    Mother - BP drops  . Head injury, closed, with brief LOC ([place]) 2011ish  . Hyperlipidemia   . Hypertension   . Insomnia   . Myalgia 03/29/2018   Due to statin  . Obesity   . Parkinson's disease ([place])   . Seasonal allergies   . Seizures ([place])   . Tobacco abuse    Past Surgical History:  Procedure Laterality Date  . ABDOMINAL HYSTERECTOMY  2012  . BREAST BIOPSY Right   . CESAREAN SECTION  1992  . CHOLECYSTECTOMY  2013  . LUMBAR LAMINECTOMY/DECOMPRESSION MICRODISCECTOMY Left 09/10/2015   Procedure: Left L4-5 Foraminotomy/Left L5-S1 Diskectomy;  Surgeon: Leeroy Cha, MD;  Location: [facility];  Service: Neurosurgery;  Laterality: Left;  Left L4-5 Foraminotomy/Left L5-S1 Diskectomy   Allergies  Allergen Reactions  . Clindamycin/Lincomycin     Pt states caused C-Diff  . Montelukast Hives and Rash    Asthma attacks Asthma attacks  . Sulfasalazine Hives    Other reaction(s):  Unknown  . Metformin And Related Diarrhea  . Benadryl [Diphenhydramine Hcl] Swelling    blistering  . Canagliflozin Other (See Comments)    Other reaction(s): Other (See Comments) Low bp Low bp Low bp  . Depakote Er [Divalproex Sodium Er] Other (See Comments)    seizures  . Dilaudid [Hydromorphone Hcl] Other (See Comments)    seizures  . Diphenhydramine-Zinc Acetate Other (See Comments)  . Erythromycin   . Ethanol   . Haloperidol Other (See Comments)  . Hydromorphone Other (See Comments)  . Neosporin [Neomycin-Bacitracin Zn-Polymyx] Swelling    blistering  . Singulair [Montelukast Sodium] Other (See Comments)    Asthma attacks  . Sitagliptin Other (See Comments)  . Statins Other (See Comments)    Other reaction(s): Unknown  . Sulfa Antibiotics Hives    Other reaction(s): Unknown  . Victoza [Liraglutide] Other (See Comments) and Nausea And Vomiting    Other reaction(s): Other (See Comments) pancreatitis pancreatitis   No current facility-administered medications on file prior to encounter.   Current Outpatient Medications on File Prior to Encounter  Medication Sig Dispense Refill  . aspirin EC 81 MG tablet Take 81 mg by mouth daily.    . blood glucose meter kit and supplies Use up to four times daily as directed, Dx E11.65, LON 99 months 1 each 0  . calcium carbonate (OSCAL) 1500 (600 Ca) MG TABS tablet Take by  mouth.    . Cholecalciferol 5000 units capsule Take 5,000 Units by mouth.     . clonazePAM (KLONOPIN) 0.5 MG tablet Take 1 tablet (0.5 mg total) by mouth as directed. Take one tablet once a day as needed up to 2-3 times a week only for severe anxiety attacks 12 tablet 0  . clotrimazole-betamethasone (LOTRISONE) cream Apply 1 application topically 2 (two) times daily. Sparingly to affected skin for up to 7 d at a time 30 g 1  . Continuous Blood Gluc Receiver (FREESTYLE LIBRE 14 DAY READER) DEVI 1 Device.    . Continuous Blood Gluc Sensor (FREESTYLE LIBRE 14 DAY SENSOR)  MISC 1 kit.    . Evolocumab (REPATHA SURECLICK) 017 MG/ML SOAJ Inject 140 mg into the skin every 14 (fourteen) days. 6 pen 0  . FARXIGA 10 MG TABS tablet TAKE 1 TABLET DAILY (HIGHER DOSE) 90 tablet 3  . folic acid (FOLVITE) 494 MCG tablet Take 800 mcg by mouth daily.     [name] glucose blood (ONE TOUCH ULTRA TEST) test strip USE UP TO FOUR TIMES A DAY AS DIRECTED; Dx 11.51, LON 99 months 300 each 3  . HUMALOG KWIKPEN 100 UNIT/ML KwikPen Give subcutaneously with meals, 20 units in the AM and 35 units with lunch and evening meal 27 pen 5  . hydrochlorothiazide (HYDRODIURIL) 25 MG tablet TAKE 1 TABLET DAILY (DOSE INCREASE, REPLACES ALL OTHER HCTZ PRESCRIPTIONS) 90 tablet 3  . Insulin Pen Needle 29G X 12.7MM MISC 1 each by Does not apply route as needed. 100 each 3  . lamoTRIgine (LAMICTAL) 100 MG tablet TAKE ONE AND ONE-HALF TABLETS DAILY (DOSE INCREASE) 135 tablet 3  . Lancets (ONETOUCH DELICA PLUS WHQPRF16B) MISC CHECK FINGER STICK BLOOD SUGARS FOUR TIMES A DAY 300 each 3  . lisinopril (ZESTRIL) 40 MG tablet TAKE 1 TABLET DAILY 90 tablet 0  . Melatonin 10 MG TABS Take 1 tablet by mouth at bedtime.    . Multiple Vitamins-Minerals (CENTRUM WOMEN) TABS Take 1 tablet by mouth.    [name] PARoxetine (PAXIL) 40 MG tablet TAKE 1 TABLET EVERY MORNING 90 tablet 1  . pioglitazone (ACTOS) 30 MG tablet TAKE 1 TABLET DAILY 90 tablet 0  . pregabalin (LYRICA) 25 MG capsule Take 1 capsule (25 mg total) by mouth 2 (two) times daily. 60 capsule 3  . propranolol (INNOPRAN XL) 80 MG 24 hr capsule TAKE 1 CAPSULE DAILY    . TRESIBA FLEXTOUCH 200 UNIT/ML SOPN Inject 120 Units into the skin at bedtime. (Patient taking differently: Inject 120 Units into the skin every morning. ) 36 pen 5  . Calcium Carbonate-Vitamin D 600-200 MG-UNIT TABS Take by mouth.    . clonazePAM (KLONOPIN) 0.5 MG tablet Take by mouth.    . doxycycline (VIBRAMYCIN) 100 MG capsule Take 1 capsule (100 mg total) by mouth 2 (two) times daily. 20 capsule 0  .  fluconazole (DIFLUCAN) 150 MG tablet Take 1 tablet (150 mg total) by mouth every 3 (three) days as needed (for vaginal itching/yeast infection sx). 2 tablet 4  . HYDROcodone-acetaminophen (NORCO/VICODIN) 5-325 MG tablet TAKE 1 2 TABLETS BY MOUTH EVERY 6 HOURS AS NEEDED FOR PAIN    . Multiple Vitamin (MULTI-VITAMIN) tablet Take by mouth.     Social History   Socioeconomic History  . Marital status: Married    Spouse name: [name]  . Number of children: 1  . Years of education: Not on file  . Highest education level: Associate degree: occupational, Hotel manager, or vocational program  Occupational History  . Occupation: Disabled  Tobacco Use  . Smoking status: Current Every Day Smoker    Packs/day: 0.50    Years: 24.00    Pack years: 12.00    Types: Cigarettes    Start date: 03/19/2004  . Smokeless tobacco: Never Used  Substance and Sexual Activity  . Alcohol use: Yes    Alcohol/week: 0.0 standard drinks    Comment: occasional- rare  . Drug use: No  . Sexual activity: Not Currently  Other Topics Concern  . Not on file  Social History Narrative   Live in private residence w/ husband and their friend who is wheelchair bound, her end of the house is wheelchair accessible.    Social Determinants of Health   Financial Resource Strain: Low Risk   . Difficulty of Paying Living Expenses: Not hard at all  Food Insecurity: Food Insecurity Present  . Worried About Charity fundraiser in the Last Year: Sometimes true  . Ran Out of Food in the Last Year: Never true  Transportation Needs:   . Lack of Transportation (Medical):   [name] Lack of Transportation (Non-Medical):   Physical Activity:   . Days of Exercise per Week:   . Minutes of Exercise per Session:   Stress: Stress Concern Present  . Feeling of Stress : To some extent  Social Connections: Unknown  . Frequency of Communication with Friends and Family: More than three times a week  . Frequency of Social Gatherings with Friends and  Family: Not on file  . Attends Religious Services: Not on file  . Active Member of Clubs or Organizations: Not on file  . Attends Archivist Meetings: Not on file  . Marital Status: Not on file  Intimate Partner Violence:   . Fear of Current or Ex-Partner:   . Emotionally Abused:   [name] Physically Abused:   . Sexually Abused:    Family History  Problem Relation Age of Onset  . COPD Mother   . Bipolar disorder Father   . Diabetes Mellitus II Father   . Bipolar disorder Brother   . Diabetes Mellitus II Brother   . Bipolar disorder Brother     OBJECTIVE:  Vitals:   05/08/20 1422 05/08/20 1424  BP: (!) 161/78   Pulse: 66   Resp: (!) 24   Temp: 99.2 F (37.3 C)   TempSrc: Oral   SpO2: 96%   Weight:  293 lb (132.9 kg)  Height:  5' 5.25" (1.657 m)   General appearance: AOx3 in no acute distress HEENT: NCAT.  Oropharynx clear.  Lungs: clear to auscultation bilaterally without adventitious breath sounds Heart: regular rate and rhythm.  Radial pulses 2+ symmetrical bilaterally Abdomen: soft; non-distended; suprapubic tenderness; bowel sounds present; no guarding or rebound tenderness Back: no CVA tenderness Extremities: no edema; symmetrical with no gross deformities Skin: warm and dry Neurologic: Ambulates from chair to exam table without difficulty Psychological: alert and cooperative; normal mood and affect  Labs Reviewed  POCT URINALYSIS DIP (MANUAL ENTRY) - Abnormal; Notable for the following components:      Result Value   Glucose, UA =500 (*)    Blood, UA trace-intact (*)    Leukocytes, UA Trace (*)    All other components within normal limits  URINE CULTURE    ASSESSMENT & PLAN:  1. Dysuria   2. Urinary frequency   3. Urinary hesitancy     Meds ordered this encounter  Medications  . phenazopyridine (PYRIDIUM) 200 MG  tablet    Sig: Take 1 tablet (200 mg total) by mouth 3 (three) times daily.    Dispense:  6 tablet    Refill:  0    Order  Specific Question:   Supervising Provider    Answer:   Chase Picket [id number]  . methocarbamol (ROBAXIN) 500 MG tablet    Sig: Take 1 tablet (500 mg total) by mouth 2 (two) times daily.    Dispense:  20 tablet    Refill:  0    Order Specific Question:   Supervising Provider    Answer:   Chase Picket [[id number]]   Dysuria Urinary Frequency Urinary hesitancy Prescribed pyridium Prescribed Robaxin Urine culture sent.  We will call you with abnormal results that need further treatment.   Push fluids and get plenty of rest.   Take pyridium as prescribed and as needed for symptomatic relief Follow up with PCP if symptoms persists Return here or go to ER if you have any new or worsening symptoms such as fever, worsening abdominal pain, nausea/vomiting, flank pain  Outlined signs and symptoms indicating need for more acute intervention. Patient verbalized understanding. After Visit Summary given.      Faustino Congress, NP 05/08/20 1451

## 2020-05-09 LAB — URINE CULTURE

## 2020-05-20 ENCOUNTER — Telehealth: Payer: Self-pay | Admitting: Family Medicine

## 2020-05-20 DIAGNOSIS — E0865 Diabetes mellitus due to underlying condition with hyperglycemia: Secondary | ICD-10-CM

## 2020-05-20 NOTE — Telephone Encounter (Signed)
Requested medication (s) are due for refill today: Yes  Requested medication (s) are on the active medication list: Yes  Last refill:  03/09/19  Future visit scheduled: No  Notes to clinic:  Prescription has expired.    Requested Prescriptions  Pending Prescriptions Disp Refills   HUMALOG KWIKPEN 100 UNIT/ML KwikPen [Pharmacy Med Name: HUMALOG KWIKPEN U100 5'S 100U/ML] 90 mL 3    Sig: INJECT 20 UNITS UNDER THE SKIN IN THE MORNING AND 35 UNITS WITH LUNCH AND EVENING MEAL (INJECT WITH MEALS)      Endocrinology:  Diabetes - Insulins Failed - 05/20/2020 11:35 AM      Failed - HBA1C is between 0 and 7.9 and within 180 days    Hemoglobin A1C  Date Value Ref Range Status  04/06/2017 9.0  Final   Hgb A1c MFr Bld  Date Value Ref Range Status  09/21/2019 8.3 (H) <5.7 % of total Hgb Final    Comment:    For someone without known diabetes, a hemoglobin A1c value of 6.5% or greater indicates that they may have  diabetes and this should be confirmed with a follow-up  test. . For someone with known diabetes, a value <7% indicates  that their diabetes is well controlled and a value  greater than or equal to 7% indicates suboptimal  control. A1c targets should be individualized based on  duration of diabetes, age, comorbid conditions, and  other considerations. . Currently, no consensus exists regarding use of hemoglobin A1c for diagnosis of diabetes for children. Verna Czech - Valid encounter within last 6 months    Recent Outpatient Visits           3 months ago Subacute vaginitis   [facility] Danelle Berry, PA-C   5 months ago Close exposure to COVID-19 virus   [facility] Danelle Berry, PA-C   8 months ago Uncontrolled diabetes mellitus type 2 with atherosclerosis of arteries of extremities [facility])   [facility] Danelle Berry, PA-C   8 months ago Uncontrolled diabetes mellitus type 2 with atherosclerosis of  arteries of extremities [facility])   [facility] Doren Custard, FNP   11 months ago Abdominal aortic atherosclerosis [facility])   [facility] [place], Percell Belt, NP       Future Appointments             In 3 weeks [facility]

## 2020-06-13 ENCOUNTER — Ambulatory Visit: Payer: Medicare Other

## 2020-07-03 ENCOUNTER — Telehealth: Payer: Self-pay

## 2020-07-03 NOTE — Telephone Encounter (Signed)
PA started through Covermymeds  [name] (Key: [facility]) Rx #: [id number] Repatha SureClick 140MG /ML auto-injectors  This request has been approved.  Please note any additional information provided by [employer] at the bottom of your screen.  Case Id : Status:Approved Review Type:Prior Auth Coverage Start Date:06/03/2020 Coverage End Date:07/03/2021;

## 2020-08-01 ENCOUNTER — Telehealth (INDEPENDENT_AMBULATORY_CARE_PROVIDER_SITE_OTHER): Payer: 59 | Admitting: Psychiatry

## 2020-08-01 ENCOUNTER — Encounter: Payer: Self-pay | Admitting: Psychiatry

## 2020-08-01 ENCOUNTER — Other Ambulatory Visit: Payer: Self-pay

## 2020-08-01 DIAGNOSIS — F17200 Nicotine dependence, unspecified, uncomplicated: Secondary | ICD-10-CM

## 2020-08-01 DIAGNOSIS — F3178 Bipolar disorder, in full remission, most recent episode mixed: Secondary | ICD-10-CM

## 2020-08-01 DIAGNOSIS — F4310 Post-traumatic stress disorder, unspecified: Secondary | ICD-10-CM

## 2020-08-01 NOTE — Progress Notes (Signed)
Provider Location : [facility] Patient Location : Home  Participants: Patient , Provider  Virtual Visit via Video Note  I connected with Debra [name] on 08/01/20 at 10:30 AM EDT by a video enabled telemedicine application and verified that I am speaking with the correct person using two identifiers.   I discussed the limitations of evaluation and management by telemedicine and the availability of in person appointments. The patient expressed understanding and agreed to proceed.     I discussed the assessment and treatment plan with the patient. The patient was provided an opportunity to ask questions and all were answered. The patient agreed with the plan and demonstrated an understanding of the instructions.   The patient was advised to call back or seek an in-person evaluation if the symptoms worsen or if the condition fails to improve as anticipated.   [place] MD OP Progress Note  08/01/2020 12:52 PM ZIYAH CORDOBA  MRN:  [id number]  Chief Complaint:  Chief Complaint    Follow-up     HPI: Debra [name] is a 59 year old Caucasian female, married, disabled, lives in [place], has a history of bipolar disorder, PTSD, tobacco use disorder, parkinsonian tremors, chronic back pain, OSA, hypertension, possible REM sleep behavior disorder was evaluated by telemedicine today.  Patient today reports she is currently doing well with regards to her mood.  She reports she is compliant on all her medications.  She denies any side effects.  Patient does report that she wakes up feeling tired since the past few weeks.  This is because she has to wake up at around 4 AM to drive her husband to work.  Her husband had a breakthrough seizure recently and hence cannot drive anymore.  She reports she tries to get back to bed when she can.  She is also going to bed early by around 6:30 - 7 PM.  She uses a CPAP regularly.  In spite of that she does not feel like she is waking up feeling refreshed.  She does have melatonin  available however she has not been using it regularly.  She is not interested in a new sleep medication today and would like to try the melatonin for now.  Patient reports she was able to bring her mom here with her from [place].  She reports her mom is quite independent .  Patient denies any suicidality, homicidality or perceptual disturbances.  She continues to have postural tremors and is under the care of neurology.  She reports she continues to take propranolol.  Patient denies any other concerns today.    Visit Diagnosis:    ICD-10-CM   1. Bipolar disorder, in full remission, most recent episode mixed ([place])  F31.78   2. PTSD (post-traumatic stress disorder)  F43.10   3. Tobacco use disorder  F17.200     Past Psychiatric History: I have reviewed past psychiatric history from my progress note on 02/16/2019  Past Medical History:  Past Medical History:  Diagnosis Date  . Anxiety   . Arthritis   . C. difficile colitis   . C. difficile colitis   . Depression   . Diabetes mellitus without complication ([place])    Type II  . Diabetes mellitus, type II ([place])   . Family history of adverse reaction to anesthesia    Mother - BP drops  . Head injury, closed, with brief LOC ([place]) 2011ish  . Hyperlipidemia   . Hypertension   . Insomnia   . Myalgia 03/29/2018  Due to statin  . Obesity   . Parkinson's disease ([place])   . Seasonal allergies   . Seizures ([place])   . Tobacco abuse     Past Surgical History:  Procedure Laterality Date  . ABDOMINAL HYSTERECTOMY  2012  . BREAST BIOPSY Right   . CESAREAN SECTION  1992  . CHOLECYSTECTOMY  2013  . LUMBAR LAMINECTOMY/DECOMPRESSION MICRODISCECTOMY Left 09/10/2015   Procedure: Left L4-5 Foraminotomy/Left L5-S1 Diskectomy;  Surgeon: Leeroy Cha, MD;  Location: [facility];  Service: Neurosurgery;  Laterality: Left;  Left L4-5 Foraminotomy/Left L5-S1 Diskectomy    Family Psychiatric History: I have reviewed family psychiatric history  from my progress note on 02/16/2019  Family History:  Family History  Problem Relation Age of Onset  . COPD Mother   . Bipolar disorder Father   . Diabetes Mellitus II Father   . Bipolar disorder Brother   . Diabetes Mellitus II Brother   . Bipolar disorder Brother     Social History: Reviewed social history from my progress note on 02/16/2019 Social History   Socioeconomic History  . Marital status: Married    Spouse name: [name]  . Number of children: 1  . Years of education: Not on file  . Highest education level: Associate degree: occupational, Hotel manager, or vocational program  Occupational History  . Occupation: Disabled  Tobacco Use  . Smoking status: Current Every Day Smoker    Packs/day: 0.50    Years: 24.00    Pack years: 12.00    Types: Cigarettes    Start date: 03/19/2004  . Smokeless tobacco: Never Used  Vaping Use  . Vaping Use: Never used  Substance and Sexual Activity  . Alcohol use: Yes    Alcohol/week: 0.0 standard drinks    Comment: occasional- rare  . Drug use: No  . Sexual activity: Not Currently  Other Topics Concern  . Not on file  Social History Narrative   Live in private residence w/ husband and their friend who is wheelchair bound, her end of the house is wheelchair accessible.    Social Determinants of Health   Financial Resource Strain:   . Difficulty of Paying Living Expenses: Not on file  Food Insecurity:   . Worried About Charity fundraiser in the Last Year: Not on file  . Ran Out of Food in the Last Year: Not on file  Transportation Needs:   . Lack of Transportation (Medical): Not on file  . Lack of Transportation (Non-Medical): Not on file  Physical Activity:   . Days of Exercise per Week: Not on file  . Minutes of Exercise per Session: Not on file  Stress:   . Feeling of Stress : Not on file  Social Connections:   . Frequency of Communication with Friends and Family: Not on file  . Frequency of Social Gatherings with Friends  and Family: Not on file  . Attends Religious Services: Not on file  . Active Member of Clubs or Organizations: Not on file  . Attends Archivist Meetings: Not on file  . Marital Status: Not on file    Allergies:  Allergies  Allergen Reactions  . Clindamycin/Lincomycin     Pt states caused C-Diff  . Montelukast Hives and Rash    Asthma attacks Asthma attacks  . Sulfasalazine Hives    Other reaction(s): Unknown  . Metformin And Related Diarrhea  . Benadryl [Diphenhydramine Hcl] Swelling    blistering  . Canagliflozin Other (See Comments)  Other reaction(s): Other (See Comments) Low bp Low bp Low bp  . Depakote Er [Divalproex Sodium Er] Other (See Comments)    seizures  . Dilaudid [Hydromorphone Hcl] Other (See Comments)    seizures  . Diphenhydramine-Zinc Acetate Other (See Comments)  . Erythromycin   . Ethanol   . Haloperidol Other (See Comments)  . Hydromorphone Other (See Comments)  . Neosporin [Neomycin-Bacitracin Zn-Polymyx] Swelling    blistering  . Singulair [Montelukast Sodium] Other (See Comments)    Asthma attacks  . Sitagliptin Other (See Comments)  . Statins Other (See Comments)    Other reaction(s): Unknown  . Sulfa Antibiotics Hives    Other reaction(s): Unknown  . Victoza [Liraglutide] Other (See Comments) and Nausea And Vomiting    Other reaction(s): Other (See Comments) pancreatitis pancreatitis    Metabolic Disorder Labs: Lab Results  Component Value Date   HGBA1C 8.3 (H) 09/21/2019   MPG 192 09/21/2019   MPG 214 06/16/2019   No results found for: PROLACTIN Lab Results  Component Value Date   CHOL 98 09/21/2019   TRIG 167 (H) 09/21/2019   HDL 49 (L) 09/21/2019   CHOLHDL 2.0 09/21/2019   LDLCALC 24 09/21/2019   LDLCALC 49 06/16/2019   Lab Results  Component Value Date   TSH 1.671 02/16/2019   TSH 2.160 08/20/2015    Therapeutic Level Labs: No results found for: LITHIUM No results found for: VALPROATE No components  found for:  CBMZ  Current Medications: Current Outpatient Medications  Medication Sig Dispense Refill  . aspirin EC 81 MG tablet Take 81 mg by mouth daily.    . blood glucose meter kit and supplies Use up to four times daily as directed, Dx E11.65, LON 99 months 1 each 0  . calcium carbonate (OSCAL) 1500 (600 Ca) MG TABS tablet Take by mouth.    . Calcium Carbonate-Vitamin D 600-200 MG-UNIT TABS Take by mouth.    . Cholecalciferol 5000 units capsule Take 5,000 Units by mouth.     . clonazePAM (KLONOPIN) 0.5 MG tablet Take by mouth.    . clonazePAM (KLONOPIN) 0.5 MG tablet Take 1 tablet (0.5 mg total) by mouth as directed. Take one tablet once a day as needed up to 2-3 times a week only for severe anxiety attacks 12 tablet 0  . clotrimazole-betamethasone (LOTRISONE) cream Apply 1 application topically 2 (two) times daily. Sparingly to affected skin for up to 7 d at a time 30 g 1  . Continuous Blood Gluc Receiver (FREESTYLE LIBRE 14 DAY READER) DEVI 1 Device.    . Continuous Blood Gluc Sensor (FREESTYLE LIBRE 14 DAY SENSOR) MISC 1 kit.    [name] doxycycline (VIBRAMYCIN) 100 MG capsule Take 1 capsule (100 mg total) by mouth 2 (two) times daily. 20 capsule 0  . Evolocumab (REPATHA SURECLICK) 176 MG/ML SOAJ Inject 140 mg into the skin every 14 (fourteen) days. 6 pen 0  . FARXIGA 10 MG TABS tablet TAKE 1 TABLET DAILY (HIGHER DOSE) 90 tablet 3  . fluconazole (DIFLUCAN) 150 MG tablet Take 1 tablet (150 mg total) by mouth every 3 (three) days as needed (for vaginal itching/yeast infection sx). 2 tablet 4  . folic acid (FOLVITE) 160 MCG tablet Take 800 mcg by mouth daily.     [name] glucose blood (ONE TOUCH ULTRA TEST) test strip USE UP TO FOUR TIMES A DAY AS DIRECTED; Dx 11.51, LON 99 months 300 each 3  . HUMALOG KWIKPEN 100 UNIT/ML KwikPen INJECT 20 UNITS UNDER THE SKIN  IN THE MORNING AND 35 UNITS WITH LUNCH AND EVENING MEAL (INJECT WITH MEALS) 90 mL 3  . hydrochlorothiazide (HYDRODIURIL) 25 MG tablet TAKE 1  TABLET DAILY (DOSE INCREASE, REPLACES ALL OTHER HCTZ PRESCRIPTIONS) 90 tablet 3  . HYDROcodone-acetaminophen (NORCO/VICODIN) 5-325 MG tablet TAKE 1 2 TABLETS BY MOUTH EVERY 6 HOURS AS NEEDED FOR PAIN    . Insulin Pen Needle 29G X 12.7MM MISC 1 each by Does not apply route as needed. 100 each 3  . lamoTRIgine (LAMICTAL) 100 MG tablet TAKE ONE AND ONE-HALF TABLETS DAILY (DOSE INCREASE) 135 tablet 3  . Lancets (ONETOUCH DELICA PLUS [id number]) MISC CHECK FINGER STICK BLOOD SUGARS FOUR TIMES A DAY 300 each 3  . lisinopril (ZESTRIL) 40 MG tablet TAKE 1 TABLET DAILY 90 tablet 0  . Melatonin 10 MG TABS Take 1 tablet by mouth at bedtime.    . methocarbamol (ROBAXIN) 500 MG tablet Take 1 tablet (500 mg total) by mouth 2 (two) times daily. 20 tablet 0  . Multiple Vitamin (MULTI-VITAMIN) tablet Take by mouth.    . Multiple Vitamins-Minerals (CENTRUM WOMEN) TABS Take 1 tablet by mouth.    [name] PARoxetine (PAXIL) 40 MG tablet TAKE 1 TABLET EVERY MORNING 90 tablet 1  . phenazopyridine (PYRIDIUM) 200 MG tablet Take 1 tablet (200 mg total) by mouth 3 (three) times daily. 6 tablet 0  . pioglitazone (ACTOS) 30 MG tablet TAKE 1 TABLET DAILY 90 tablet 0  . pregabalin (LYRICA) 25 MG capsule Take 1 capsule (25 mg total) by mouth 2 (two) times daily. 60 capsule 3  . propranolol (INNOPRAN XL) 80 MG 24 hr capsule TAKE 1 CAPSULE DAILY    . propranolol ER (INDERAL LA) 80 MG 24 hr capsule Take 80 mg by mouth daily.    . TRESIBA FLEXTOUCH 200 UNIT/ML SOPN Inject 120 Units into the skin at bedtime. (Patient taking differently: Inject 120 Units into the skin every morning. ) 36 pen 5   No current facility-administered medications for this visit.     Musculoskeletal: Strength & Muscle Tone: UTA Gait & Station: normal Patient leans: N/A  Psychiatric Specialty Exam: Review of Systems  Constitutional: Positive for fatigue.  Psychiatric/Behavioral: Positive for sleep disturbance. Negative for agitation, behavioral problems,  confusion, decreased concentration, dysphoric mood, hallucinations, self-injury and suicidal ideas. The patient is not nervous/anxious and is not hyperactive.   All other systems reviewed and are negative.   There were no vitals taken for this visit.There is no height or weight on file to calculate BMI.  General Appearance: Casual  Eye Contact:  Fair  Speech:  Normal Rate  Volume:  Normal  Mood:  Euthymic  Affect:  Congruent  Thought Process:  Goal Directed and Descriptions of Associations: Intact  Orientation:  Full (Time, Place, and Person)  Thought Content: Logical   Suicidal Thoughts:  No  Homicidal Thoughts:  No  Memory:  Immediate;   Fair Recent;   Fair Remote;   Fair  Judgement:  Fair  Insight:  Fair  Psychomotor Activity:  postural tremors  Concentration:  Concentration: Fair and Attention Span: Fair  Recall:  [employer] of Knowledge: Fair  Language: Fair  Akathisia:  No  Handed:  Right  AIMS (if indicated): UTA  Assets:  Communication Skills Desire for Improvement Housing Social Support  ADL's:  Intact  Cognition: WNL  Sleep:  restless   Screenings: Mini-Mental     Office Visit from 09/21/2016 in [facility]  Total Score (max 30 points )  73    PHQ2-9     Office Visit from 02/16/2020 in [facility] Video Visit from 12/06/2019 in [facility] Office Visit from 09/21/2019 in [facility] Office Visit from 09/07/2019 in [facility] Office Visit from 06/13/2019 in [facility]  PHQ-2 Total Score 5 0 0 0 0  PHQ-9 Total Score 11 0 0 0 0       Assessment and Plan: Debra [name] is a 59 year old Caucasian female, married, disabled, lives in [place], has a history of bipolar disorder, PTSD, hypertension, diabetes melitis, OSA on CPAP, chronic pain was evaluated by telemedicine today.  Patient is biologically predisposed given her history of trauma, family  history of mental health problems.  Patient with psychosocial stressors of the pandemic, mother's health issues, her own health problems, financial problems.  Patient is currently stable on medications. Plan as noted below.  Plan Bipolar disorder in remission Lamotrigine 150 mg p.o. daily Paxil 40 mg p.o. daily Klonopin 0.5 mg p.o. as needed 2-3 times a week for severe anxiety attacks only.  I have reviewed [place] controlled substance database.  PTSD-stable Paxil as prescribed Patient was advised to call for therapy sessions last visit-pending  Insomnia-restless Melatonin 10 mg p.o. nightly Continue CPAP for OSA She is not interested in sleep medication changes today.  Tobacco use disorder-improving Provided smoking cessation counseling  Patient to continue to follow-up with neurology for tremors.  Follow-up in clinic in 2 to 3 months or sooner if needed.  I have spent atleast 20 minutes face to face with patient today. More than 50 % of the time was spent for preparing to see the patient ( e.g., review of test, records ), ordering medications and test ,psychoeducation and supportive psychotherapy and care coordination,as well as documenting clinical information in electronic health record. This note was generated in part or whole with voice recognition software. Voice recognition is usually quite accurate but there are transcription errors that can and very often do occur. I apologize for any typographical errors that were not detected and corrected.     Ursula Alert, MD 08/01/2020, 12:52 PM

## 2020-10-01 ENCOUNTER — Telehealth (INDEPENDENT_AMBULATORY_CARE_PROVIDER_SITE_OTHER): Payer: 59 | Admitting: Psychiatry

## 2020-10-01 ENCOUNTER — Other Ambulatory Visit: Payer: Self-pay

## 2020-10-01 ENCOUNTER — Encounter: Payer: Self-pay | Admitting: Psychiatry

## 2020-10-01 DIAGNOSIS — F17200 Nicotine dependence, unspecified, uncomplicated: Secondary | ICD-10-CM | POA: Diagnosis not present

## 2020-10-01 DIAGNOSIS — F3178 Bipolar disorder, in full remission, most recent episode mixed: Secondary | ICD-10-CM

## 2020-10-01 DIAGNOSIS — F4310 Post-traumatic stress disorder, unspecified: Secondary | ICD-10-CM | POA: Diagnosis not present

## 2020-10-01 MED ORDER — PAROXETINE HCL 40 MG PO TABS: ORAL_TABLET | ORAL | 1 refills | Status: DC

## 2020-10-01 NOTE — Progress Notes (Signed)
Virtual Visit via Video Note  I connected with Debra [name] on 10/01/20 at 10:00 AM EDT by a video enabled telemedicine application and verified that I am speaking with the correct person using two identifiers. Location Provider Location : [facility] Patient Location : Home  Participants: Patient , Provider    I discussed the limitations of evaluation and management by telemedicine and the availability of in person appointments. The patient expressed understanding and agreed to proceed.   I discussed the assessment and treatment plan with the patient. The patient was provided an opportunity to ask questions and all were answered. The patient agreed with the plan and demonstrated an understanding of the instructions.   The patient was advised to call back or seek an in-person evaluation if the symptoms worsen or if the condition fails to improve as anticipated.   [place] MD OP Progress Note  10/01/2020 10:21 AM Debra [name]  MRN:  [id number]  Chief Complaint:  Chief Complaint    Follow-up     HPI: Debra [name] is a 59 year old Caucasian female, married, disabled, lives in [place], has a history of bipolar disorder, PTSD, tobacco use disorder, parkinsonian tremors, chronic back pain, OSA, hypertension, possible REM sleep behavior disorder was evaluated by telemedicine today.  Patient today reports she continues to feel tired since her sleep continues to be disrupted.  This is because she has to wake up early in the morning around 4 AM to drive her husband to work.  Her husband had breakthrough seizures and currently cannot drive.  She reports she is waiting for him to start back driving so she can get a full night sleep.  She has no difficulty falling asleep or maintaining sleep otherwise.  Patient denies any mood lability.  She denies any depression or anxiety symptoms.  She is compliant on her medications and denies side effects.  She denies any suicidality, homicidality or perceptual  disturbances.  Patient denies any other concerns today.  Visit Diagnosis:    ICD-10-CM   1. Bipolar disorder, in full remission, most recent episode mixed (HCC)  F31.78 PARoxetine (PAXIL) 40 MG tablet  2. PTSD (post-traumatic stress disorder)  F43.10   3. Tobacco use disorder  F17.200     Past Psychiatric History: I have reviewed past psychiatric history from my progress note on 02/16/2019  Past Medical History:  Past Medical History:  Diagnosis Date  . Anxiety   . Arthritis   . C. difficile colitis   . C. difficile colitis   . Depression   . Diabetes mellitus without complication ([place])    Type II  . Diabetes mellitus, type II ([place])   . Family history of adverse reaction to anesthesia    Mother - BP drops  . Head injury, closed, with brief LOC ([place]) 2011ish  . Hyperlipidemia   . Hypertension   . Insomnia   . Myalgia 03/29/2018   Due to statin  . Obesity   . Parkinson's disease ([place])   . Seasonal allergies   . Seizures ([place])   . Tobacco abuse     Past Surgical History:  Procedure Laterality Date  . ABDOMINAL HYSTERECTOMY  2012  . BREAST BIOPSY Right   . CESAREAN SECTION  1992  . CHOLECYSTECTOMY  2013  . LUMBAR LAMINECTOMY/DECOMPRESSION MICRODISCECTOMY Left 09/10/2015   Procedure: Left L4-5 Foraminotomy/Left L5-S1 Diskectomy;  Surgeon: Leeroy Cha, MD;  Location: [facility];  Service: Neurosurgery;  Laterality: Left;  Left L4-5 Foraminotomy/Left L5-S1 Diskectomy  Family Psychiatric History: I have reviewed family psychiatric history from my progress note from 02/16/2019  Family History:  Family History  Problem Relation Age of Onset  . COPD Mother   . Bipolar disorder Father   . Diabetes Mellitus II Father   . Bipolar disorder Brother   . Diabetes Mellitus II Brother   . Bipolar disorder Brother     Social History: Reviewed social history from my progress note from 02/16/2019 Social History   Socioeconomic History  . Marital status: Married    Spouse  name: [name]  . Number of children: 1  . Years of education: Not on file  . Highest education level: Associate degree: occupational, Hotel manager, or vocational program  Occupational History  . Occupation: Disabled  Tobacco Use  . Smoking status: Current Every Day Smoker    Packs/day: 0.50    Years: 24.00    Pack years: 12.00    Types: Cigarettes    Start date: 03/19/2004  . Smokeless tobacco: Never Used  Vaping Use  . Vaping Use: Never used  Substance and Sexual Activity  . Alcohol use: Yes    Alcohol/week: 0.0 standard drinks    Comment: occasional- rare  . Drug use: No  . Sexual activity: Not Currently  Other Topics Concern  . Not on file  Social History Narrative   Live in private residence w/ husband and their friend who is wheelchair bound, her end of the house is wheelchair accessible.    Social Determinants of Health   Financial Resource Strain:   . Difficulty of Paying Living Expenses: Not on file  Food Insecurity:   . Worried About Charity fundraiser in the Last Year: Not on file  . Ran Out of Food in the Last Year: Not on file  Transportation Needs:   . Lack of Transportation (Medical): Not on file  . Lack of Transportation (Non-Medical): Not on file  Physical Activity:   . Days of Exercise per Week: Not on file  . Minutes of Exercise per Session: Not on file  Stress:   . Feeling of Stress : Not on file  Social Connections:   . Frequency of Communication with Friends and Family: Not on file  . Frequency of Social Gatherings with Friends and Family: Not on file  . Attends Religious Services: Not on file  . Active Member of Clubs or Organizations: Not on file  . Attends Archivist Meetings: Not on file  . Marital Status: Not on file    Allergies:  Allergies  Allergen Reactions  . Clindamycin/Lincomycin     Pt states caused C-Diff  . Montelukast Hives and Rash    Asthma attacks Asthma attacks  . Sulfasalazine Hives    Other reaction(s):  Unknown  . Metformin And Related Diarrhea  . Benadryl [Diphenhydramine Hcl] Swelling    blistering  . Canagliflozin Other (See Comments)    Other reaction(s): Other (See Comments) Low bp Low bp Low bp  . Depakote Er [Divalproex Sodium Er] Other (See Comments)    seizures  . Dilaudid [Hydromorphone Hcl] Other (See Comments)    seizures  . Diphenhydramine-Zinc Acetate Other (See Comments)  . Erythromycin   . Ethanol   . Haloperidol Other (See Comments)  . Hydromorphone Other (See Comments)  . Neosporin [Neomycin-Bacitracin Zn-Polymyx] Swelling    blistering  . Singulair [Montelukast Sodium] Other (See Comments)    Asthma attacks  . Sitagliptin Other (See Comments)  . Statins Other (See Comments)  Other reaction(s): Unknown  . Sulfa Antibiotics Hives    Other reaction(s): Unknown  . Victoza [Liraglutide] Other (See Comments) and Nausea And Vomiting    Other reaction(s): Other (See Comments) pancreatitis pancreatitis    Metabolic Disorder Labs: Lab Results  Component Value Date   HGBA1C 8.3 (H) 09/21/2019   MPG 192 09/21/2019   MPG 214 06/16/2019   No results found for: PROLACTIN Lab Results  Component Value Date   CHOL 98 09/21/2019   TRIG 167 (H) 09/21/2019   HDL 49 (L) 09/21/2019   CHOLHDL 2.0 09/21/2019   LDLCALC 24 09/21/2019   LDLCALC 49 06/16/2019   Lab Results  Component Value Date   TSH 1.671 02/16/2019   TSH 2.160 08/20/2015    Therapeutic Level Labs: No results found for: LITHIUM No results found for: VALPROATE No components found for:  CBMZ  Current Medications: Current Outpatient Medications  Medication Sig Dispense Refill  . aspirin EC 81 MG tablet Take 81 mg by mouth daily.    . blood glucose meter kit and supplies Use up to four times daily as directed, Dx E11.65, LON 99 months 1 each 0  . calcium carbonate (OSCAL) 1500 (600 Ca) MG TABS tablet Take by mouth.    . Calcium Carbonate-Vitamin D 600-200 MG-UNIT TABS Take by mouth.    .  Cholecalciferol 5000 units capsule Take 5,000 Units by mouth.     . clonazePAM (KLONOPIN) 0.5 MG tablet Take 1 tablet (0.5 mg total) by mouth as directed. Take one tablet once a day as needed up to 2-3 times a week only for severe anxiety attacks 12 tablet 0  . clotrimazole-betamethasone (LOTRISONE) cream Apply 1 application topically 2 (two) times daily. Sparingly to affected skin for up to 7 d at a time 30 g 1  . Continuous Blood Gluc Receiver (FREESTYLE LIBRE 14 DAY READER) DEVI 1 Device.    . Continuous Blood Gluc Sensor (FREESTYLE LIBRE 14 DAY SENSOR) MISC 1 kit.    [name] doxycycline (VIBRAMYCIN) 100 MG capsule Take 1 capsule (100 mg total) by mouth 2 (two) times daily. 20 capsule 0  . Evolocumab (REPATHA SURECLICK) 098 MG/ML SOAJ Inject 140 mg into the skin every 14 (fourteen) days. 6 pen 0  . FARXIGA 10 MG TABS tablet TAKE 1 TABLET DAILY (HIGHER DOSE) 90 tablet 3  . fluconazole (DIFLUCAN) 150 MG tablet Take 1 tablet (150 mg total) by mouth every 3 (three) days as needed (for vaginal itching/yeast infection sx). 2 tablet 4  . folic acid (FOLVITE) 119 MCG tablet Take 800 mcg by mouth daily.     [name] glucose blood (ONE TOUCH ULTRA TEST) test strip USE UP TO FOUR TIMES A DAY AS DIRECTED; Dx 11.51, LON 99 months 300 each 3  . HUMALOG KWIKPEN 100 UNIT/ML KwikPen INJECT 20 UNITS UNDER THE SKIN IN THE MORNING AND 35 UNITS WITH LUNCH AND EVENING MEAL (INJECT WITH MEALS) 90 mL 3  . hydrochlorothiazide (HYDRODIURIL) 25 MG tablet TAKE 1 TABLET DAILY (DOSE INCREASE, REPLACES ALL OTHER HCTZ PRESCRIPTIONS) 90 tablet 3  . HYDROcodone-acetaminophen (NORCO/VICODIN) 5-325 MG tablet TAKE 1 2 TABLETS BY MOUTH EVERY 6 HOURS AS NEEDED FOR PAIN    . Insulin Pen Needle 29G X 12.7MM MISC 1 each by Does not apply route as needed. 100 each 3  . lamoTRIgine (LAMICTAL) 100 MG tablet TAKE ONE AND ONE-HALF TABLETS DAILY (DOSE INCREASE) 135 tablet 3  . Lancets (ONETOUCH DELICA PLUS [id number]) MISC CHECK FINGER STICK BLOOD SUGARS  FOUR  TIMES A DAY 300 each 3  . lisinopril (ZESTRIL) 40 MG tablet TAKE 1 TABLET DAILY 90 tablet 0  . Melatonin 10 MG TABS Take 1 tablet by mouth at bedtime.    . methocarbamol (ROBAXIN) 500 MG tablet Take 1 tablet (500 mg total) by mouth 2 (two) times daily. 20 tablet 0  . Multiple Vitamin (MULTI-VITAMIN) tablet Take by mouth.    . Multiple Vitamins-Minerals (CENTRUM WOMEN) TABS Take 1 tablet by mouth.    [name] PARoxetine (PAXIL) 40 MG tablet TAKE 1 TABLET EVERY MORNING 90 tablet 1  . phenazopyridine (PYRIDIUM) 200 MG tablet Take 1 tablet (200 mg total) by mouth 3 (three) times daily. 6 tablet 0  . pioglitazone (ACTOS) 30 MG tablet TAKE 1 TABLET DAILY 90 tablet 0  . pregabalin (LYRICA) 25 MG capsule Take 1 capsule (25 mg total) by mouth 2 (two) times daily. 60 capsule 3  . propranolol (INNOPRAN XL) 80 MG 24 hr capsule TAKE 1 CAPSULE DAILY    . propranolol ER (INDERAL LA) 80 MG 24 hr capsule Take 80 mg by mouth daily.    . TRESIBA FLEXTOUCH 200 UNIT/ML SOPN Inject 120 Units into the skin at bedtime. (Patient taking differently: Inject 120 Units into the skin every morning. ) 36 pen 5   No current facility-administered medications for this visit.     Musculoskeletal: Strength & Muscle Tone: UTA Gait & Station: normal Patient leans: N/A  Psychiatric Specialty Exam: Review of Systems  Constitutional: Positive for fatigue.  Neurological: Positive for tremors.  Psychiatric/Behavioral: Positive for sleep disturbance.  All other systems reviewed and are negative.   There were no vitals taken for this visit.There is no height or weight on file to calculate BMI.  General Appearance: Casual  Eye Contact:  Fair  Speech:  Clear and Coherent  Volume:  Normal  Mood:  Euthymic  Affect:  Congruent  Thought Process:  Goal Directed and Descriptions of Associations: Intact  Orientation:  Full (Time, Place, and Person)  Thought Content: Logical   Suicidal Thoughts:  No  Homicidal Thoughts:  No   Memory:  Immediate;   Fair Recent;   Fair Remote;   Fair  Judgement:  Fair  Insight:  Fair  Psychomotor Activity:  Tremor  Concentration:  Concentration: Fair and Attention Span: Fair  Recall:  [employer] of Knowledge: Fair  Language: Fair  Akathisia:  No  Handed:  Right  AIMS (if indicated): has postural tremors  Assets:  Communication Skills Desire for Improvement Housing Social Support  ADL's:  Intact  Cognition: WNL  Sleep:  Restless   Screenings: Mini-Mental     Office Visit from 09/21/2016 in [facility]  Total Score (max 30 points ) 27    PHQ2-9     Office Visit from 02/16/2020 in [facility] Video Visit from 12/06/2019 in [facility] Office Visit from 09/21/2019 in [facility] Office Visit from 09/07/2019 in [facility] Office Visit from 06/13/2019 in [facility]  PHQ-2 Total Score 5 0 0 0 0  PHQ-9 Total Score 11 0 0 0 0       Assessment and Plan: Debra [name] is a 59 year old Caucasian female, married, disabled, lives in [place], has a history of bipolar disorder, PTSD, hypertension, diabetes melitis, OSA on CPAP, chronic pain was evaluated by telemedicine today.  Patient is biologically predisposed given her history of trauma, family history of mental health problems.  Patient with psychosocial stressors of the pandemic, mother's health issues, her own health problems, financial problems.  Patient however is currently stable.  Plan as noted below.  Plan Bipolar disorder in remission Lamictal 150 mg p.o. daily Paxil 40 mg p.o. daily.   Klonopin 0.5 mg as needed 2-3 times a week for anxiety attacks.  She has been limiting use.  PTSD-stable Paxil as prescribed  Insomnia-restless Sleep is disrupted since she has to wake up early to drive her husband to work. Continue melatonin 10 mg p.o. nightly CPAP for OSA   Tobacco use  disorder-improving Provided counseling  Patient continues to have problems and will continue to follow-up with neurology.  Follow-up in clinic in 3 months or sooner if needed.  I have spent atleast 20 minutes face to face by video with patient today. More than 50 % of the time was spent for preparing to see the patient ( e.g., review of test, records ),ordering medications and test ,psychoeducation and supportive psychotherapy and care coordination,as well as documenting clinical information in electronic health record. This note was generated in part or whole with voice recognition software. Voice recognition is usually quite accurate but there are transcription errors that can and very often do occur. I apologize for any typographical errors that were not detected and corrected.          Ursula Alert, MD 10/01/2020, 10:21 AM

## 2020-10-10 ENCOUNTER — Observation Stay
Admission: EM | Admit: 2020-10-10 | Discharge: 2020-10-12 | Disposition: A | Discharge status: Home / Self Care | Payer: Managed Care, Other (non HMO) | Attending: Student in an Organized Health Care Education/Training Program | Admitting: Student in an Organized Health Care Education/Training Program

## 2020-10-10 ENCOUNTER — Emergency Department: Payer: Managed Care, Other (non HMO)

## 2020-10-10 ENCOUNTER — Encounter: Payer: Self-pay | Admitting: Emergency Medicine

## 2020-10-10 ENCOUNTER — Other Ambulatory Visit: Payer: Self-pay

## 2020-10-10 DIAGNOSIS — IMO0002 Reserved for concepts with insufficient information to code with codable children: Secondary | ICD-10-CM | POA: Diagnosis present

## 2020-10-10 DIAGNOSIS — F32A Depression, unspecified: Secondary | ICD-10-CM | POA: Diagnosis present

## 2020-10-10 DIAGNOSIS — E1165 Type 2 diabetes mellitus with hyperglycemia: Secondary | ICD-10-CM

## 2020-10-10 DIAGNOSIS — Z6841 Body Mass Index (BMI) 40.0 and over, adult: Secondary | ICD-10-CM

## 2020-10-10 DIAGNOSIS — Z7982 Long term (current) use of aspirin: Secondary | ICD-10-CM | POA: Insufficient documentation

## 2020-10-10 DIAGNOSIS — Z794 Long term (current) use of insulin: Secondary | ICD-10-CM | POA: Insufficient documentation

## 2020-10-10 DIAGNOSIS — R569 Unspecified convulsions: Secondary | ICD-10-CM

## 2020-10-10 DIAGNOSIS — F17200 Nicotine dependence, unspecified, uncomplicated: Secondary | ICD-10-CM | POA: Diagnosis not present

## 2020-10-10 DIAGNOSIS — Z9049 Acquired absence of other specified parts of digestive tract: Secondary | ICD-10-CM

## 2020-10-10 DIAGNOSIS — Z885 Allergy status to narcotic agent status: Secondary | ICD-10-CM

## 2020-10-10 DIAGNOSIS — I70209 Unspecified atherosclerosis of native arteries of extremities, unspecified extremity: Secondary | ICD-10-CM

## 2020-10-10 DIAGNOSIS — I10 Essential (primary) hypertension: Secondary | ICD-10-CM | POA: Diagnosis not present

## 2020-10-10 DIAGNOSIS — Z79899 Other long term (current) drug therapy: Secondary | ICD-10-CM | POA: Diagnosis not present

## 2020-10-10 DIAGNOSIS — F4310 Post-traumatic stress disorder, unspecified: Secondary | ICD-10-CM | POA: Diagnosis present

## 2020-10-10 DIAGNOSIS — G250 Essential tremor: Secondary | ICD-10-CM | POA: Diagnosis present

## 2020-10-10 DIAGNOSIS — Z818 Family history of other mental and behavioral disorders: Secondary | ICD-10-CM

## 2020-10-10 DIAGNOSIS — G20 Parkinson's disease: Secondary | ICD-10-CM | POA: Diagnosis not present

## 2020-10-10 DIAGNOSIS — F17210 Nicotine dependence, cigarettes, uncomplicated: Secondary | ICD-10-CM | POA: Diagnosis present

## 2020-10-10 DIAGNOSIS — M1990 Unspecified osteoarthritis, unspecified site: Secondary | ICD-10-CM | POA: Diagnosis present

## 2020-10-10 DIAGNOSIS — T426X5A Adverse effect of other antiepileptic and sedative-hypnotic drugs, initial encounter: Secondary | ICD-10-CM | POA: Diagnosis present

## 2020-10-10 DIAGNOSIS — Z833 Family history of diabetes mellitus: Secondary | ICD-10-CM

## 2020-10-10 DIAGNOSIS — E785 Hyperlipidemia, unspecified: Secondary | ICD-10-CM | POA: Diagnosis present

## 2020-10-10 DIAGNOSIS — L270 Generalized skin eruption due to drugs and medicaments taken internally: Principal | ICD-10-CM | POA: Diagnosis present

## 2020-10-10 DIAGNOSIS — T782XXA Anaphylactic shock, unspecified, initial encounter: Secondary | ICD-10-CM | POA: Diagnosis present

## 2020-10-10 DIAGNOSIS — Z20822 Contact with and (suspected) exposure to covid-19: Secondary | ICD-10-CM | POA: Insufficient documentation

## 2020-10-10 DIAGNOSIS — Z881 Allergy status to other antibiotic agents status: Secondary | ICD-10-CM

## 2020-10-10 DIAGNOSIS — Z882 Allergy status to sulfonamides status: Secondary | ICD-10-CM

## 2020-10-10 DIAGNOSIS — Z90710 Acquired absence of both cervix and uterus: Secondary | ICD-10-CM

## 2020-10-10 DIAGNOSIS — T7840XA Allergy, unspecified, initial encounter: Secondary | ICD-10-CM

## 2020-10-10 DIAGNOSIS — E1151 Type 2 diabetes mellitus with diabetic peripheral angiopathy without gangrene: Secondary | ICD-10-CM

## 2020-10-10 DIAGNOSIS — Z888 Allergy status to other drugs, medicaments and biological substances status: Secondary | ICD-10-CM

## 2020-10-10 DIAGNOSIS — F419 Anxiety disorder, unspecified: Secondary | ICD-10-CM | POA: Diagnosis present

## 2020-10-10 LAB — COMPREHENSIVE METABOLIC PANEL
ALT: 31 U/L (ref 0–44)
AST: 32 U/L (ref 15–41)
Albumin: 4.2 g/dL (ref 3.5–5.0)
Alkaline Phosphatase: 72 U/L (ref 38–126)
Anion gap: 14 (ref 5–15)
BUN: 22 mg/dL — ABNORMAL HIGH (ref 6–20)
CO2: 22 mmol/L (ref 22–32)
Calcium: 8.9 mg/dL (ref 8.9–10.3)
Chloride: 99 mmol/L (ref 98–111)
Creatinine, Ser: 0.87 mg/dL (ref 0.44–1.00)
GFR, Estimated: >60 mL/min (ref >60)
Glucose, Bld: 286 mg/dL — ABNORMAL HIGH (ref 70–99)
Potassium: 4.2 mmol/L (ref 3.5–5.1)
Sodium: 135 mmol/L (ref 135–145)
Total Bilirubin: 0.8 mg/dL (ref 0.3–1.2)
Total Protein: 6.8 g/dL (ref 6.5–8.1)

## 2020-10-10 LAB — CBC WITH DIFFERENTIAL/PLATELET
Abs Immature Granulocytes: 0.06 10*3/uL (ref 0.00–0.07)
Basophils Absolute: 0 10*3/uL (ref 0.0–0.1)
Basophils Relative: 1 %
Eosinophils Absolute: 0.3 10*3/uL (ref 0.0–0.5)
Eosinophils Relative: 5 %
HCT: 41.5 % (ref 36.0–46.0)
Hemoglobin: 14.1 g/dL (ref 12.0–15.0)
Immature Granulocytes: 1 %
Lymphocytes Relative: 28 %
Lymphs Abs: 1.7 10*3/uL (ref 0.7–4.0)
MCH: 32.9 pg (ref 26.0–34.0)
MCHC: 34 g/dL (ref 30.0–36.0)
MCV: 96.7 fL (ref 80.0–100.0)
Monocytes Absolute: 0.6 10*3/uL (ref 0.1–1.0)
Monocytes Relative: 10 %
Neutro Abs: 3.4 10*3/uL (ref 1.7–7.7)
Neutrophils Relative %: 55 %
Platelets: 244 10*3/uL (ref 150–400)
RBC: 4.29 MIL/uL (ref 3.87–5.11)
RDW: 13.2 % (ref 11.5–15.5)
WBC: 6.1 10*3/uL (ref 4.0–10.5)
nRBC: 0 % (ref 0.0–0.2)

## 2020-10-10 LAB — RESPIRATORY PANEL BY RT PCR (FLU A&B, COVID)
Influenza A by PCR: NEGATIVE
Influenza B by PCR: NEGATIVE
SARS Coronavirus 2 by RT PCR: NEGATIVE

## 2020-10-10 LAB — GLUCOSE, CAPILLARY: Glucose-Capillary: 458 mg/dL — ABNORMAL HIGH (ref 70–99)

## 2020-10-10 LAB — CBG MONITORING, ED: Glucose-Capillary: 285 mg/dL — ABNORMAL HIGH (ref 70–99)

## 2020-10-10 MED ORDER — HYDROXYZINE HCL 25 MG PO TABS
25.0000 mg | ORAL_TABLET | Freq: Once | ORAL | Status: AC
  Administered 2020-10-10: 25 mg via ORAL
  Filled 2020-10-10: qty 1

## 2020-10-10 MED ORDER — INSULIN GLARGINE 100 UNIT/ML ~~LOC~~ SOLN
70.0000 [IU] | Freq: Every day | SUBCUTANEOUS | Status: DC
  Administered 2020-10-10 – 2020-10-12 (×3): 70 [IU] via SUBCUTANEOUS
  Filled 2020-10-10 (×3): qty 0.7

## 2020-10-10 MED ORDER — ACETAMINOPHEN 650 MG RE SUPP: 650.0000 mg | Freq: Four times a day (QID) | RECTAL | Status: DC | PRN

## 2020-10-10 MED ORDER — VITAMIN D3 25 MCG (1000 UNIT) PO TABS
5000.0000 [IU] | ORAL_TABLET | Freq: Every day | ORAL | Status: DC
  Administered 2020-10-11 – 2020-10-12 (×2): 5000 [IU] via ORAL
  Filled 2020-10-10 (×2): qty 5

## 2020-10-10 MED ORDER — LORAZEPAM 2 MG/ML IJ SOLN
0.5000 mg | Freq: Once | INTRAMUSCULAR | Status: AC
  Administered 2020-10-10: 0.5 mg via INTRAVENOUS
  Filled 2020-10-10: qty 1

## 2020-10-10 MED ORDER — EPINEPHRINE 0.3 MG/0.3ML IJ SOAJ
0.3000 mg | Freq: Once | INTRAMUSCULAR | Status: AC
  Administered 2020-10-10: 0.3 mg via INTRAMUSCULAR
  Filled 2020-10-10: qty 0.3

## 2020-10-10 MED ORDER — HYDROCORTISONE 1 % EX CREA
TOPICAL_CREAM | Freq: Four times a day (QID) | CUTANEOUS | Status: DC | PRN
  Filled 2020-10-10 (×2): qty 28

## 2020-10-10 MED ORDER — MULTI-VITAMIN PO TABS: 1.0000 | ORAL_TABLET | Freq: Every day | ORAL | Status: DC

## 2020-10-10 MED ORDER — EPINEPHRINE 0.3 MG/0.3ML IJ SOAJ
0.3000 mg | INTRAMUSCULAR | Status: DC | PRN
  Filled 2020-10-10: qty 0.3

## 2020-10-10 MED ORDER — METHYLPREDNISOLONE SODIUM SUCC 125 MG IJ SOLR
60.0000 mg | Freq: Two times a day (BID) | INTRAMUSCULAR | Status: DC
  Administered 2020-10-10 – 2020-10-12 (×4): 60 mg via INTRAVENOUS
  Filled 2020-10-10 (×4): qty 2

## 2020-10-10 MED ORDER — FOLIC ACID 1 MG PO TABS
0.5000 mg | ORAL_TABLET | Freq: Every day | ORAL | Status: DC
  Administered 2020-10-12: 0.5 mg via ORAL
  Filled 2020-10-10 (×2): qty 1

## 2020-10-10 MED ORDER — FAMOTIDINE IN NACL 20-0.9 MG/50ML-% IV SOLN
20.0000 mg | Freq: Once | INTRAVENOUS | Status: AC
  Administered 2020-10-10: 20 mg via INTRAVENOUS
  Filled 2020-10-10: qty 50

## 2020-10-10 MED ORDER — EPINEPHRINE 0.3 MG/0.3ML IJ SOAJ
INTRAMUSCULAR | Status: AC
  Administered 2020-10-10: 0.3 mg via INTRAMUSCULAR
  Filled 2020-10-10: qty 0.3

## 2020-10-10 MED ORDER — EPINEPHRINE 0.3 MG/0.3ML IJ SOAJ: 0.3000 mg | Freq: Once | INTRAMUSCULAR | Status: AC

## 2020-10-10 MED ORDER — ASPIRIN EC 81 MG PO TBEC
81.0000 mg | DELAYED_RELEASE_TABLET | Freq: Every day | ORAL | Status: DC
  Administered 2020-10-11 – 2020-10-12 (×2): 81 mg via ORAL
  Filled 2020-10-10 (×2): qty 1

## 2020-10-10 MED ORDER — CLONAZEPAM 0.5 MG PO TABS
0.5000 mg | ORAL_TABLET | Freq: Every day | ORAL | Status: DC | PRN
  Administered 2020-10-11: 0.5 mg via ORAL
  Filled 2020-10-10: qty 1

## 2020-10-10 MED ORDER — INSULIN ASPART 100 UNIT/ML ~~LOC~~ SOLN
7.0000 [IU] | Freq: Once | SUBCUTANEOUS | Status: AC
  Administered 2020-10-10: 7 [IU] via SUBCUTANEOUS
  Filled 2020-10-10: qty 1

## 2020-10-10 MED ORDER — CALCIUM CARBONATE 1250 (500 CA) MG PO TABS
1.0000 | ORAL_TABLET | Freq: Every day | ORAL | Status: DC
  Administered 2020-10-11 – 2020-10-12 (×2): 500 mg via ORAL
  Filled 2020-10-10 (×2): qty 1

## 2020-10-10 MED ORDER — LORAZEPAM 2 MG/ML IJ SOLN: 1.0000 mg | INTRAMUSCULAR | Status: DC | PRN

## 2020-10-10 MED ORDER — INSULIN ASPART 100 UNIT/ML ~~LOC~~ SOLN
0.0000 [IU] | Freq: Every day | SUBCUTANEOUS | Status: DC
  Administered 2020-10-11: 4 [IU] via SUBCUTANEOUS
  Filled 2020-10-10: qty 1

## 2020-10-10 MED ORDER — CLONAZEPAM 0.5 MG PO TABS: 0.5000 mg | ORAL_TABLET | ORAL | Status: DC

## 2020-10-10 MED ORDER — SODIUM CHLORIDE 0.9 % IV SOLN: INTRAVENOUS | Status: DC

## 2020-10-10 MED ORDER — ONDANSETRON HCL 4 MG/2ML IJ SOLN: 4.0000 mg | Freq: Four times a day (QID) | INTRAMUSCULAR | Status: DC | PRN

## 2020-10-10 MED ORDER — CALCIUM CARBONATE-VITAMIN D 600-200 MG-UNIT PO TABS: 1.0000 | ORAL_TABLET | Freq: Every day | ORAL | Status: DC

## 2020-10-10 MED ORDER — ONDANSETRON HCL 4 MG PO TABS: 4.0000 mg | ORAL_TABLET | Freq: Four times a day (QID) | ORAL | Status: DC | PRN

## 2020-10-10 MED ORDER — HYDRALAZINE HCL 20 MG/ML IJ SOLN: 5.0000 mg | INTRAMUSCULAR | Status: DC | PRN

## 2020-10-10 MED ORDER — PAROXETINE HCL 20 MG PO TABS
40.0000 mg | ORAL_TABLET | Freq: Every day | ORAL | Status: DC
  Administered 2020-10-11 – 2020-10-12 (×2): 40 mg via ORAL
  Filled 2020-10-10 (×2): qty 2

## 2020-10-10 MED ORDER — SODIUM CHLORIDE 0.9 % IV BOLUS
1000.0000 mL | Freq: Once | INTRAVENOUS | Status: AC
  Administered 2020-10-10: 1000 mL via INTRAVENOUS

## 2020-10-10 MED ORDER — MELATONIN 5 MG PO TABS
10.0000 mg | ORAL_TABLET | Freq: Every day | ORAL | Status: DC
  Administered 2020-10-10 – 2020-10-11 (×2): 10 mg via ORAL
  Filled 2020-10-10 (×2): qty 2

## 2020-10-10 MED ORDER — PROPRANOLOL HCL ER 80 MG PO CP24
80.0000 mg | ORAL_CAPSULE | Freq: Every day | ORAL | Status: DC
  Administered 2020-10-10 – 2020-10-12 (×3): 80 mg via ORAL
  Filled 2020-10-10 (×3): qty 1

## 2020-10-10 MED ORDER — ENOXAPARIN SODIUM 80 MG/0.8ML ~~LOC~~ SOLN
0.5000 mg/kg | SUBCUTANEOUS | Status: DC
  Administered 2020-10-10 – 2020-10-11 (×2): 67.5 mg via SUBCUTANEOUS
  Filled 2020-10-10 (×2): qty 0.8

## 2020-10-10 MED ORDER — NICOTINE 21 MG/24HR TD PT24
21.0000 mg | MEDICATED_PATCH | Freq: Every day | TRANSDERMAL | Status: DC
  Filled 2020-10-10 (×3): qty 1

## 2020-10-10 MED ORDER — ACETAMINOPHEN 325 MG PO TABS: 650.0000 mg | ORAL_TABLET | Freq: Four times a day (QID) | ORAL | Status: DC | PRN

## 2020-10-10 MED ORDER — ADULT MULTIVITAMIN W/MINERALS CH
1.0000 | ORAL_TABLET | Freq: Every day | ORAL | Status: DC
  Administered 2020-10-11 – 2020-10-12 (×2): 1 via ORAL
  Filled 2020-10-10 (×2): qty 1

## 2020-10-10 MED ORDER — HYDROCHLOROTHIAZIDE 25 MG PO TABS
25.0000 mg | ORAL_TABLET | Freq: Every day | ORAL | Status: DC
  Administered 2020-10-11 – 2020-10-12 (×2): 25 mg via ORAL
  Filled 2020-10-10 (×2): qty 1

## 2020-10-10 MED ORDER — LAMOTRIGINE 100 MG PO TABS
150.0000 mg | ORAL_TABLET | Freq: Every day | ORAL | Status: DC
  Administered 2020-10-11 – 2020-10-12 (×2): 150 mg via ORAL
  Filled 2020-10-10 (×2): qty 2

## 2020-10-10 MED ORDER — INSULIN ASPART 100 UNIT/ML ~~LOC~~ SOLN
0.0000 [IU] | Freq: Three times a day (TID) | SUBCUTANEOUS | Status: DC
  Administered 2020-10-10: 5 [IU] via SUBCUTANEOUS
  Administered 2020-10-11: 7 [IU] via SUBCUTANEOUS
  Administered 2020-10-11 (×2): 9 [IU] via SUBCUTANEOUS
  Administered 2020-10-12: 7 [IU] via SUBCUTANEOUS
  Filled 2020-10-10 (×5): qty 1

## 2020-10-10 MED ORDER — HYDROXYZINE HCL 50 MG/ML IM SOLN
25.0000 mg | Freq: Once | INTRAMUSCULAR | Status: AC
  Administered 2020-10-10: 25 mg via INTRAMUSCULAR
  Filled 2020-10-10: qty 0.5

## 2020-10-10 MED ORDER — METHYLPREDNISOLONE SODIUM SUCC 125 MG IJ SOLR
125.0000 mg | Freq: Once | INTRAMUSCULAR | Status: AC
  Administered 2020-10-10: 125 mg via INTRAVENOUS
  Filled 2020-10-10: qty 2

## 2020-10-10 MED ORDER — LISINOPRIL 20 MG PO TABS
40.0000 mg | ORAL_TABLET | Freq: Every day | ORAL | Status: DC
  Administered 2020-10-11 – 2020-10-12 (×2): 40 mg via ORAL
  Filled 2020-10-10 (×2): qty 2

## 2020-10-10 MED ORDER — ENOXAPARIN SODIUM 40 MG/0.4ML ~~LOC~~ SOLN: 40.0000 mg | SUBCUTANEOUS | Status: DC

## 2020-10-10 MED ORDER — CLONAZEPAM 0.5 MG PO TABS
0.5000 mg | ORAL_TABLET | Freq: Once | ORAL | Status: AC
  Administered 2020-10-10: 0.5 mg via ORAL
  Filled 2020-10-10: qty 1

## 2020-10-10 MED ORDER — PROPRANOLOL HCL ER BEADS 80 MG PO CP24: 80.0000 mg | ORAL_CAPSULE | Freq: Every day | ORAL | Status: DC

## 2020-10-10 MED ORDER — HYDROXYZINE HCL 25 MG PO TABS
25.0000 mg | ORAL_TABLET | Freq: Three times a day (TID) | ORAL | Status: DC
  Administered 2020-10-10 – 2020-10-12 (×5): 25 mg via ORAL
  Filled 2020-10-10 (×5): qty 1

## 2020-10-10 MED ORDER — FAMOTIDINE IN NACL 20-0.9 MG/50ML-% IV SOLN
20.0000 mg | Freq: Two times a day (BID) | INTRAVENOUS | Status: DC
  Administered 2020-10-10 – 2020-10-12 (×4): 20 mg via INTRAVENOUS
  Filled 2020-10-10 (×4): qty 50

## 2020-10-10 NOTE — Plan of Care (Signed)
  Problem: Education: Goal: Knowledge of General Education information will improve Description: Including pain rating scale, medication(s)/side effects and non-pharmacologic comfort measures Outcome: Progressing   Problem: Health Behavior/Discharge Planning: Goal: Ability to manage health-related needs will improve Outcome: Progressing   Problem: Clinical Measurements: Goal: Ability to maintain clinical measurements within normal limits will improve Outcome: Progressing Goal: Will remain free from infection Outcome: Progressing Goal: Diagnostic test results will improve Outcome: Progressing Goal: Respiratory complications will improve Outcome: Progressing Goal: Cardiovascular complication will be avoided Outcome: Progressing   Problem: Nutrition: Goal: Adequate nutrition will be maintained Outcome: Progressing   Problem: Coping: Goal: Level of anxiety will decrease Outcome: Progressing   Problem: Elimination: Goal: Will not experience complications related to bowel motility Outcome: Progressing Goal: Will not experience complications related to urinary retention Outcome: Progressing   Problem: Pain Managment: Goal: General experience of comfort will improve Outcome: Progressing   Problem: Safety: Goal: Ability to remain free from injury will improve Outcome: Progressing   Problem: Skin Integrity: Goal: Risk for impaired skin integrity will decrease Outcome: Progressing   Problem: Education: Goal: Expressions of having a comfortable level of knowledge regarding the disease process will increase Outcome: Progressing   Problem: Coping: Goal: Ability to adjust to condition or change in health will improve Outcome: Progressing Goal: Ability to identify appropriate support needs will improve Outcome: Progressing   Problem: Health Behavior/Discharge Planning: Goal: Compliance with prescribed medication regimen will improve Outcome: Progressing   Problem:  Medication: Goal: Risk for medication side effects will decrease Outcome: Progressing   Problem: Clinical Measurements: Goal: Complications related to the disease process, condition or treatment will be avoided or minimized Outcome: Progressing Goal: Diagnostic test results will improve Outcome: Progressing   Problem: Safety: Goal: Verbalization of understanding the information provided will improve Outcome: Progressing   Problem: Self-Concept: Goal: Level of anxiety will decrease Outcome: Progressing Goal: Ability to verbalize feelings about condition will improve Outcome: Progressing   

## 2020-10-10 NOTE — ED Notes (Signed)
Pt calls out due to increased pruritus, pt is red and sitting and appears uncomfortable, EDP was notified and new orders received.

## 2020-10-10 NOTE — ED Triage Notes (Signed)
C/O itching all over,  Hives to body, throat and tongue itching since last night. States symptoms have worsened over night.  Chest red.    Taking primidone x 1 week as a new medication.

## 2020-10-10 NOTE — ED Notes (Signed)
Pt has increased redness and itching and red rash.  No swelling or itching in mouth. EDP is aware and medications were ordered.

## 2020-10-10 NOTE — ED Notes (Signed)
Report given to Brittany, RN.

## 2020-10-10 NOTE — ED Notes (Signed)
Call to lab to ensure labs are run on blood that was sent when IV was started

## 2020-10-10 NOTE — Progress Notes (Signed)
PHARMACIST - PHYSICIAN COMMUNICATION  CONCERNING:  Enoxaparin (Lovenox) for DVT Prophylaxis    RECOMMENDATION: Patient was prescribed enoxaprin 40mg  q24 hours for VTE prophylaxis.   Filed Weights   10/10/20 0817  Weight: 132.9 kg (292 lb 15.9 oz)    Body mass index is 48.38 kg/m.  Estimated Creatinine Clearance: 98.8 mL/min (by C-G formula based on SCr of 0.87 mg/dL).   Based on [facility] policy patient is candidate for enoxaparin 0.5mg /kg TBW SQ every 24 hours based on BMI being >30.   DESCRIPTION: Pharmacy has adjusted enoxaparin dose per [facility] policy.  Patient is now receiving enoxaparin 67.5 mg every 24 hours    [facility], PharmD Clinical Pharmacist  10/10/2020 7:08 PM

## 2020-10-10 NOTE — ED Provider Notes (Signed)
[facility] Emergency Department Provider Note    First MD Initiated Contact with Patient 10/10/20 [phone]     (approximate)  I have reviewed the triage vital signs and the nursing notes.   HISTORY  Chief Complaint Allergic Reaction    HPI HONG TIMM is a 59 y.o. female below listed past medical history presents to the ER for allergic reaction including hives itchiness to her body now involving her tongue and throat.  Has extensive history of allergies.  Has been on primidone for a week and thinks it is related to that.  Denies any bug bites.  Denies any history of anaphylactic reaction.    Past Medical History:  Diagnosis Date  . Anxiety   . Arthritis   . C. difficile colitis   . C. difficile colitis   . Depression   . Diabetes mellitus without complication ([place])    Type II  . Diabetes mellitus, type II ([place])   . Family history of adverse reaction to anesthesia    Mother - BP drops  . Head injury, closed, with brief LOC ([place]) 2011ish  . Hyperlipidemia   . Hypertension   . Insomnia   . Myalgia 03/29/2018   Due to statin  . Obesity   . Parkinson's disease ([place])   . Seasonal allergies   . Seizures ([place])   . Tobacco abuse    Family History  Problem Relation Age of Onset  . COPD Mother   . Bipolar disorder Father   . Diabetes Mellitus II Father   . Bipolar disorder Brother   . Diabetes Mellitus II Brother   . Bipolar disorder Brother    Past Surgical History:  Procedure Laterality Date  . ABDOMINAL HYSTERECTOMY  2012  . BREAST BIOPSY Right   . CESAREAN SECTION  1992  . CHOLECYSTECTOMY  2013  . LUMBAR LAMINECTOMY/DECOMPRESSION MICRODISCECTOMY Left 09/10/2015   Procedure: Left L4-5 Foraminotomy/Left L5-S1 Diskectomy;  Surgeon: Leeroy Cha, MD;  Location: [facility];  Service: Neurosurgery;  Laterality: Left;  Left L4-5 Foraminotomy/Left L5-S1 Diskectomy   Patient Active Problem List   Diagnosis Date Noted  . Bipolar disorder, in full  remission, most recent episode mixed ([place]) 08/01/2020  . Pain due to onychomycosis of toenails of both feet 09/11/2019  . Pincer nail deformity 09/11/2019  . Bipolar 1 disorder, mixed, moderate ([place]) 08/30/2019  . PTSD (post-traumatic stress disorder) 08/30/2019  . Tobacco use disorder 08/30/2019  . Obstructive sleep apnea 11/18/2018  . Parkinsonian tremor ([place]) 11/18/2018  . RBD (REM behavioral disorder) 05/19/2018  . Morbid obesity ([place]) 05/03/2018  . Osteopenia 03/16/2018  . Abdominal aortic atherosclerosis ([place]) 03/04/2018  . Adverse reaction to statin medication 03/04/2018  . Uncontrolled diabetes mellitus type 2 with atherosclerosis of arteries of extremities ([place]) 03/04/2018  . Hyperlipidemia 04/20/2017  . Lumbar disc disease with radiculopathy 08/20/2015  . Lumbar herniated disc 08/20/2015  . Left-sided low back pain with sciatica 06/03/2015  . Bipolar disorder ([place]) 06/03/2015  . Insomnia, persistent 06/03/2015  . BP (high blood pressure) 06/03/2015  . Compulsive tobacco user syndrome 06/03/2015  . Phlebectasia 06/03/2015      Prior to Admission medications   Medication Sig Start Date End Date Taking? Authorizing Provider  aspirin EC 81 MG tablet Take 81 mg by mouth daily.    [provider]  blood glucose meter kit and supplies Use up to four times daily as directed, Dx E11.65, LON 99 months 05/18/18   Lada, Satira Anis, MD  calcium carbonate (OSCAL) 1500 (600 Ca) MG TABS tablet Take by mouth.    [provider]  Calcium Carbonate-Vitamin D 600-200 MG-UNIT TABS Take by mouth.    [provider]  Cholecalciferol 5000 units capsule Take 5,000 Units by mouth.     [provider]  clonazePAM (KLONOPIN) 0.5 MG tablet Take 1 tablet (0.5 mg total) by mouth as directed. Take one tablet once a day as needed up to 2-3 times a week only for severe anxiety attacks 02/01/20   Ursula Alert, MD  clotrimazole-betamethasone (LOTRISONE) cream Apply 1  application topically 2 (two) times daily. Sparingly to affected skin for up to 7 d at a time 02/16/20   Delsa Grana, PA-C  Continuous Blood Gluc Receiver (FREESTYLE LIBRE 14 DAY READER) DEVI 1 Device. 10/27/17   [provider]  Continuous Blood Gluc Sensor (FREESTYLE LIBRE 14 DAY SENSOR) MISC 1 kit. 10/27/17   [provider]  doxycycline (VIBRAMYCIN) 100 MG capsule Take 1 capsule (100 mg total) by mouth 2 (two) times daily. 02/20/20   Sharion Balloon, NP  Evolocumab (REPATHA SURECLICK) 409 MG/ML SOAJ Inject 140 mg into the skin every 14 (fourteen) days. 03/04/20   Minna Merritts, MD  FARXIGA 10 MG TABS tablet TAKE 1 TABLET DAILY (HIGHER DOSE) 07/03/19   Poulose, Bethel Born, NP  fluconazole (DIFLUCAN) 150 MG tablet Take 1 tablet (150 mg total) by mouth every 3 (three) days as needed (for vaginal itching/yeast infection sx). 02/16/20   Delsa Grana, PA-C  folic acid (FOLVITE) 811 MCG tablet Take 800 mcg by mouth daily.     [provider]  glucose blood (ONE TOUCH ULTRA TEST) test strip USE UP TO FOUR TIMES A DAY AS DIRECTED; Dx 11.51, LON 99 months 03/20/19   Lada, Satira Anis, MD  HUMALOG KWIKPEN 100 UNIT/ML KwikPen INJECT 20 UNITS UNDER THE SKIN IN THE MORNING AND 35 UNITS WITH LUNCH AND EVENING MEAL (INJECT WITH MEALS) 05/20/20   Delsa Grana, PA-C  hydrochlorothiazide (HYDRODIURIL) 25 MG tablet TAKE 1 TABLET DAILY (DOSE INCREASE, REPLACES ALL OTHER HCTZ PRESCRIPTIONS) 08/04/19   Poulose, Bethel Born, NP  HYDROcodone-acetaminophen (NORCO/VICODIN) 5-325 MG tablet TAKE 1 2 TABLETS BY MOUTH EVERY 6 HOURS AS NEEDED FOR PAIN 09/20/19   [provider]  Insulin Pen Needle 29G X 12.7MM MISC 1 each by Does not apply route as needed. 04/03/19   Hubbard Hartshorn, FNP  lamoTRIgine (LAMICTAL) 100 MG tablet TAKE ONE AND ONE-HALF TABLETS DAILY (DOSE INCREASE) 04/29/20   Ursula Alert, MD  Lancets (ONETOUCH DELICA PLUS [id number]) MISC CHECK FINGER STICK BLOOD SUGARS FOUR TIMES A DAY  06/04/19   Poulose, Bethel Born, NP  lisinopril (ZESTRIL) 40 MG tablet TAKE 1 TABLET DAILY 04/25/20   Delsa Grana, PA-C  Melatonin 10 MG TABS Take 1 tablet by mouth at bedtime.    [provider]  methocarbamol (ROBAXIN) 500 MG tablet Take 1 tablet (500 mg total) by mouth 2 (two) times daily. 05/08/20   Faustino Congress, NP  Multiple Vitamin (MULTI-VITAMIN) tablet Take by mouth.    [provider]  Multiple Vitamins-Minerals (CENTRUM WOMEN) TABS Take 1 tablet by mouth.    [provider]  PARoxetine (PAXIL) 40 MG tablet TAKE 1 TABLET EVERY MORNING 10/01/20   Ursula Alert, MD  phenazopyridine (PYRIDIUM) 200 MG tablet Take 1 tablet (200 mg total) by mouth 3 (three) times daily. 05/08/20   Faustino Congress, NP  pioglitazone (ACTOS) 30 MG tablet TAKE 1 TABLET DAILY 02/15/20  Delsa Grana, PA-C  pregabalin (LYRICA) 25 MG capsule Take 1 capsule (25 mg total) by mouth 2 (two) times daily. 03/14/20   Delsa Grana, PA-C  propranolol (INNOPRAN XL) 80 MG 24 hr capsule TAKE 1 CAPSULE DAILY 11/14/19   [provider]  propranolol ER (INDERAL LA) 80 MG 24 hr capsule Take 80 mg by mouth daily. 05/12/20   [provider]  TRESIBA FLEXTOUCH 200 UNIT/ML SOPN Inject 120 Units into the skin at bedtime. Patient taking differently: Inject 120 Units into the skin every morning.  03/09/19   Arnetha Courser, MD    Allergies Clindamycin/lincomycin, Montelukast, Sulfasalazine, Metformin and related, Benadryl [diphenhydramine hcl], Canagliflozin, Depakote er [divalproex sodium er], Dilaudid [hydromorphone hcl], Diphenhydramine-zinc acetate, Erythromycin, Ethanol, Haloperidol, Hydromorphone, Neosporin [neomycin-bacitracin zn-polymyx], Singulair [montelukast sodium], Sitagliptin, Statins, Sulfa antibiotics, and Victoza [liraglutide]    Social History Social History   Tobacco Use  . Smoking status: Current Every Day Smoker    Packs/day: 0.50    Years: 24.00    Pack years: 12.00     Types: Cigarettes    Start date: 03/19/2004  . Smokeless tobacco: Never Used  Vaping Use  . Vaping Use: Never used  Substance Use Topics  . Alcohol use: Yes    Alcohol/week: 0.0 standard drinks    Comment: occasional- rare  . Drug use: No    Review of Systems Patient denies headaches, rhinorrhea, blurry vision, numbness, shortness of breath, chest pain, edema, cough, abdominal pain, nausea, vomiting, diarrhea, dysuria, fevers, rashes or hallucinations unless otherwise stated above in HPI. ____________________________________________   PHYSICAL EXAM:  VITAL SIGNS: Vitals:   10/10/20 1245 10/10/20 1300  BP: (!) 149/74 (!) 126/110  Pulse: 65 91  Resp: 14 (!) 23  Temp:    SpO2: 94% 93%    Constitutional: Alert and oriented. Anxious appearing, speaking in full sentences Eyes: Conjunctivae are normal.  Head: Atraumatic. Nose: No congestion/rhinnorhea. Mouth/Throat: Mucous membranes are moist.   Neck: No stridor. Painless ROM.  Cardiovascular: Normal rate, regular rhythm. Grossly normal heart sounds.  Good peripheral circulation. Respiratory: Normal respiratory effort.  No retractions. Lungs CTAB. Gastrointestinal: Soft and nontender. No distention. No abdominal bruits. No CVA tenderness. Genitourinary: deferred Musculoskeletal: No lower extremity tenderness nor edema.  No joint effusions. Neurologic:  Normal speech and language. No gross focal neurologic deficits are appreciated. No facial droop Skin: diffuse urticarial rash to torso, neck and UE. Psychiatric: Mood and affect are normal. Speech and behavior are normal.  ____________________________________________   LABS (all labs ordered are listed, but only abnormal results are displayed)  Results for orders placed or performed during the hospital encounter of 10/10/20 (from the past 24 hour(s))  CBC with Differential     Status: None   Collection Time: 10/10/20  8:32 AM  Result Value Ref Range   WBC 6.1 4.0 - 10.5  K/uL   RBC 4.29 3.87 - 5.11 MIL/uL   Hemoglobin 14.1 12.0 - 15.0 g/dL   HCT 41.5 36 - 46 %   MCV 96.7 80.0 - 100.0 fL   MCH 32.9 26.0 - 34.0 pg   MCHC 34.0 30.0 - 36.0 g/dL   RDW 13.2 11.5 - 15.5 %   Platelets 244 150 - 400 K/uL   nRBC 0.0 0.0 - 0.2 %   Neutrophils Relative % 55 %   Neutro Abs 3.4 1.7 - 7.7 K/uL   Lymphocytes Relative 28 %   Lymphs Abs 1.7 0.7 - 4.0 K/uL   Monocytes Relative 10 %  Monocytes Absolute 0.6 0.1 - 1.0 K/uL   Eosinophils Relative 5 %   Eosinophils Absolute 0.3 0.0 - 0.5 K/uL   Basophils Relative 1 %   Basophils Absolute 0.0 0.0 - 0.1 K/uL   Immature Granulocytes 1 %   Abs Immature Granulocytes 0.06 0.00 - 0.07 K/uL  Comprehensive metabolic panel     Status: Abnormal   Collection Time: 10/10/20  8:32 AM  Result Value Ref Range   Sodium 135 135 - 145 mmol/L   Potassium 4.2 3.5 - 5.1 mmol/L   Chloride 99 98 - 111 mmol/L   CO2 22 22 - 32 mmol/L   Glucose, Bld 286 (H) 70 - 99 mg/dL   BUN 22 (H) 6 - 20 mg/dL   Creatinine, Ser 0.87 0.44 - 1.00 mg/dL   Calcium 8.9 8.9 - 10.3 mg/dL   Total Protein 6.8 6.5 - 8.1 g/dL   Albumin 4.2 3.5 - 5.0 g/dL   AST 32 15 - 41 U/L   ALT 31 0 - 44 U/L   Alkaline Phosphatase 72 38 - 126 U/L   Total Bilirubin 0.8 0.3 - 1.2 mg/dL   GFR, Estimated >60 >60 mL/min   Anion gap 14 5 - 15   ____________________________________________  EKG My review and personal interpretation at Time: 8:24   Indication: anaphylaxis  Rate: 65  Rhythm: sinus Axis: normal Other: normal intervals, no stemi ____________________________________________  RADIOLOGY   ____________________________________________   PROCEDURES  Procedure(s) performed:  Procedures    Critical Care performed: no ____________________________________________   INITIAL IMPRESSION / ASSESSMENT AND PLAN / ED COURSE  Pertinent labs & imaging results that were available during my care of the patient were reviewed by me and considered in my medical decision  making (see chart for details).   DDX: Anaphylaxis, anaphylactoid, dermatitis, drug eruption, urticaria  YASHEKA FOSSETT is a 59 y.o. who presents to the ED with presentation as described above.  Patient presenting with what appears to be anaphylactic reaction.  She is protecting her airway at this time and speaking but feeling like her throat is tightening up and having some discomfort in her tongue and on appreciating significant angioedema but has quite impressive diffuse urticaria.  Will give epinephrine.  She is allergic to Benadryl therefore will give hydroxyzine as well as Pepcid and Solu-Medrol and observe.  Clinical Course as of Oct 11 1339  Thu Oct 10, 2020  0930 Patient reassessed.  Feels much improved.  Urticaria and has significantly improved as well.  We will continue to observe.   [PR]  6754 Patient observed and having recurrent worsening urticaria feeling like her throat is tightening up again.  I cannot appreciate any significant uvular edema.  May have some scattered wheeze on exam.  Will give additional dose of epinephrine and continue to monitor.   [PR]  1329 Tightness in her throat has resolved no wheezing on exam.  Given her extensive history of allergies and requiring second dose of epi will discuss with hospitalist for observation.   [PR]    Clinical Course User Index [PR] Merlyn Lot, MD    The patient was evaluated in Emergency Department today for the symptoms described in the history of present illness. He/she was evaluated in the context of the global COVID-19 pandemic, which necessitated consideration that the patient might be at risk for infection with the SARS-CoV-2 virus that causes COVID-19. Institutional protocols and algorithms that pertain to the evaluation of patients at risk for COVID-19 are in a state  of rapid change based on information released by regulatory bodies including the CDC and federal and state organizations. These policies and algorithms were  followed during the patient's care in the ED.  As part of my medical decision making, I reviewed the following data within the [facility] notes reviewed and incorporated, Labs reviewed, notes from prior ED visits and [place] Controlled Substance Database   ____________________________________________   FINAL CLINICAL IMPRESSION(S) / ED DIAGNOSES  Final diagnoses:  Anaphylaxis, initial encounter      NEW MEDICATIONS STARTED DURING THIS VISIT:  New Prescriptions   No medications on file     Note:  This document was prepared using Dragon voice recognition software and may include unintentional dictation errors.    Merlyn Lot, MD 10/10/20 1341

## 2020-10-10 NOTE — ED Notes (Signed)
Pt arrives anxious.  Pt reports itching and her skin appears visibly red.  Pt states that she has itching in her mouth with some tongue swelling.  Pt given medication per MD order and she states that she is still itching but that she is feeling better and the swelling in her mouth is decreased.  Visitor at the bedside.  Pt on the monitor.

## 2020-10-10 NOTE — H&P (Signed)
History and Physical    Debra [name] FMB:[id number] DOB: 05-16-1961 DOA: 10/10/2020  Referring MD/NP/PA:   PCP: Forbes Cellar, MD   Patient coming from:  The patient is coming from home.  At baseline, pt is independent for most of ADL.        Chief Complaint: allergic reaction   HPI: Debra [name] is a 59 y.o. female with medical history significant of hypertension, hyperlipidemia, diabetes mellitus, depression, anxiety, seizure, tobacco abuse, C. difficile colitis, bipolar, PTSD, essential tremor, who presents with allergic reaction.  Pt states that she has newly started taking primidone for essential tremor in the past 3 days. She developed allergic reaction, including diffused erythema rash, itchiness to whole body.  She also feels itchy in throat and tongue.  She denies shortness of breath, no feeling of throat closing.  Denies chest pain, fever or chills.  Patient has some mild dry cough. No nausea vomiting, diarrhea, abdominal pain, symptoms of UTI or unilateral weakness.  Patient was given 20 mg of Pepcid, 25 mg of hydroxyzine, 125 mg of Solu-Medrol, EpiPen 0.3 mg x2 in ED.   ED Course: pt was found to have WBC 6.1, negative Covid PCR, electrolytes renal function okay, temperature normal, blood pressure 176/110, heart rate 91, RR 23, oxygen saturation 92-100% on room air.  Chest x-ray showed vascular congestion. Pt is placed on progressive benefit observation.  Review of Systems:   General: no fevers, chills, no body weight gain, has fatigue HEENT: no blurry vision, hearing changes or sore throat Respiratory: no dyspnea, has coughing, no wheezing CV: no chest pain, no palpitations GI: no nausea, vomiting, abdominal pain, diarrhea, constipation GU: no dysuria, burning on urination, increased urinary frequency, hematuria  Ext: no leg edema Neuro: no unilateral weakness, numbness, or tingling, no vision change or hearing loss Skin: has diffuse erythematous rash which are  itchy MSK: No muscle spasm, no deformity, no limitation of range of movement in spin Heme: No easy bruising.  Travel history: No recent long distant travel.  Allergy:  Allergies  Allergen Reactions  . Clindamycin/Lincomycin     Pt states caused C-Diff  . Montelukast Hives and Rash    Asthma attacks Asthma attacks  . Sulfasalazine Hives    Other reaction(s): Unknown  . Metformin And Related Diarrhea  . Benadryl [Diphenhydramine Hcl] Swelling    blistering  . Canagliflozin Other (See Comments)    Other reaction(s): Other (See Comments) Low bp Low bp Low bp  . Depakote Er [Divalproex Sodium Er] Other (See Comments)    seizures  . Dilaudid [Hydromorphone Hcl] Other (See Comments)    seizures  . Diphenhydramine-Zinc Acetate Other (See Comments)  . Erythromycin   . Ethanol   . Haloperidol Other (See Comments)  . Hydromorphone Other (See Comments)  . Neosporin [Neomycin-Bacitracin Zn-Polymyx] Swelling    blistering  . Singulair [Montelukast Sodium] Other (See Comments)    Asthma attacks  . Sitagliptin Other (See Comments)  . Statins Other (See Comments)    Other reaction(s): Unknown  . Sulfa Antibiotics Hives    Other reaction(s): Unknown  . Victoza [Liraglutide] Other (See Comments) and Nausea And Vomiting    Other reaction(s): Other (See Comments) pancreatitis pancreatitis    Past Medical History:  Diagnosis Date  . Anxiety   . Arthritis   . C. difficile colitis   . C. difficile colitis   . Depression   . Diabetes mellitus without complication ([place])    Type II  . Diabetes mellitus,  type II ([place])   . Family history of adverse reaction to anesthesia    Mother - BP drops  . Head injury, closed, with brief LOC ([place]) 2011ish  . Hyperlipidemia   . Hypertension   . Insomnia   . Myalgia 03/29/2018   Due to statin  . Obesity   . Parkinson's disease ([place])   . Seasonal allergies   . Seizures ([place])   . Tobacco abuse     Past Surgical History:  Procedure  Laterality Date  . ABDOMINAL HYSTERECTOMY  2012  . BREAST BIOPSY Right   . CESAREAN SECTION  1992  . CHOLECYSTECTOMY  2013  . LUMBAR LAMINECTOMY/DECOMPRESSION MICRODISCECTOMY Left 09/10/2015   Procedure: Left L4-5 Foraminotomy/Left L5-S1 Diskectomy;  Surgeon: Leeroy Cha, MD;  Location: [facility];  Service: Neurosurgery;  Laterality: Left;  Left L4-5 Foraminotomy/Left L5-S1 Diskectomy    Social History:  reports that she has been smoking cigarettes. She started smoking about 16 years ago. She has a 12.00 pack-year smoking history. She has never used smokeless tobacco. She reports current alcohol use. She reports that she does not use drugs.  Family History:  Family History  Problem Relation Age of Onset  . COPD Mother   . Bipolar disorder Father   . Diabetes Mellitus II Father   . Bipolar disorder Brother   . Diabetes Mellitus II Brother   . Bipolar disorder Brother      Prior to Admission medications   Medication Sig Start Date End Date Taking? Authorizing Provider  aspirin EC 81 MG tablet Take 81 mg by mouth daily.    [provider]  blood glucose meter kit and supplies Use up to four times daily as directed, Dx E11.65, LON 99 months 05/18/18   Lada, Satira Anis, MD  calcium carbonate (OSCAL) 1500 (600 Ca) MG TABS tablet Take by mouth.    [provider]  Calcium Carbonate-Vitamin D 600-200 MG-UNIT TABS Take by mouth.    [provider]  Cholecalciferol 5000 units capsule Take 5,000 Units by mouth.     [provider]  clonazePAM (KLONOPIN) 0.5 MG tablet Take 1 tablet (0.5 mg total) by mouth as directed. Take one tablet once a day as needed up to 2-3 times a week only for severe anxiety attacks 02/01/20   Ursula Alert, MD  clotrimazole-betamethasone (LOTRISONE) cream Apply 1 application topically 2 (two) times daily. Sparingly to affected skin for up to 7 d at a time 02/16/20   Delsa Grana, PA-C  Continuous Blood Gluc Receiver (FREESTYLE LIBRE  14 DAY READER) DEVI 1 Device. 10/27/17   [provider]  Continuous Blood Gluc Sensor (FREESTYLE LIBRE 14 DAY SENSOR) MISC 1 kit. 10/27/17   [provider]  doxycycline (VIBRAMYCIN) 100 MG capsule Take 1 capsule (100 mg total) by mouth 2 (two) times daily. 02/20/20   Sharion Balloon, NP  Evolocumab (REPATHA SURECLICK) 885 MG/ML SOAJ Inject 140 mg into the skin every 14 (fourteen) days. 03/04/20   Minna Merritts, MD  FARXIGA 10 MG TABS tablet TAKE 1 TABLET DAILY (HIGHER DOSE) 07/03/19   Poulose, Bethel Born, NP  fluconazole (DIFLUCAN) 150 MG tablet Take 1 tablet (150 mg total) by mouth every 3 (three) days as needed (for vaginal itching/yeast infection sx). 02/16/20   Delsa Grana, PA-C  folic acid (FOLVITE) 027 MCG tablet Take 800 mcg by mouth daily.     [provider]  glucose blood (ONE TOUCH ULTRA TEST) test strip USE UP TO FOUR  TIMES A DAY AS DIRECTED; Dx 11.51, LON 99 months 03/20/19   Lada, Satira Anis, MD  HUMALOG KWIKPEN 100 UNIT/ML KwikPen INJECT 20 UNITS UNDER THE SKIN IN THE MORNING AND 35 UNITS WITH LUNCH AND EVENING MEAL (INJECT WITH MEALS) 05/20/20   Delsa Grana, PA-C  hydrochlorothiazide (HYDRODIURIL) 25 MG tablet TAKE 1 TABLET DAILY (DOSE INCREASE, REPLACES ALL OTHER HCTZ PRESCRIPTIONS) 08/04/19   Poulose, Bethel Born, NP  HYDROcodone-acetaminophen (NORCO/VICODIN) 5-325 MG tablet TAKE 1 2 TABLETS BY MOUTH EVERY 6 HOURS AS NEEDED FOR PAIN 09/20/19   [provider]  Insulin Pen Needle 29G X 12.7MM MISC 1 each by Does not apply route as needed. 04/03/19   Hubbard Hartshorn, FNP  lamoTRIgine (LAMICTAL) 100 MG tablet TAKE ONE AND ONE-HALF TABLETS DAILY (DOSE INCREASE) 04/29/20   Ursula Alert, MD  Lancets (ONETOUCH DELICA PLUS ZOXWRU04V) MISC CHECK FINGER STICK BLOOD SUGARS FOUR TIMES A DAY 06/04/19   Poulose, Bethel Born, NP  lisinopril (ZESTRIL) 40 MG tablet TAKE 1 TABLET DAILY 04/25/20   Delsa Grana, PA-C  Melatonin 10 MG TABS Take 1 tablet by mouth at  bedtime.    [provider]  methocarbamol (ROBAXIN) 500 MG tablet Take 1 tablet (500 mg total) by mouth 2 (two) times daily. 05/08/20   Faustino Congress, NP  Multiple Vitamin (MULTI-VITAMIN) tablet Take by mouth.    [provider]  Multiple Vitamins-Minerals (CENTRUM WOMEN) TABS Take 1 tablet by mouth.    [provider]  PARoxetine (PAXIL) 40 MG tablet TAKE 1 TABLET EVERY MORNING 10/01/20   Ursula Alert, MD  phenazopyridine (PYRIDIUM) 200 MG tablet Take 1 tablet (200 mg total) by mouth 3 (three) times daily. 05/08/20   Faustino Congress, NP  pioglitazone (ACTOS) 30 MG tablet TAKE 1 TABLET DAILY 02/15/20   Delsa Grana, PA-C  pregabalin (LYRICA) 25 MG capsule Take 1 capsule (25 mg total) by mouth 2 (two) times daily. 03/14/20   Delsa Grana, PA-C  propranolol (INNOPRAN XL) 80 MG 24 hr capsule TAKE 1 CAPSULE DAILY 11/14/19   [provider]  propranolol ER (INDERAL LA) 80 MG 24 hr capsule Take 80 mg by mouth daily. 05/12/20   [provider]  TRESIBA FLEXTOUCH 200 UNIT/ML SOPN Inject 120 Units into the skin at bedtime. Patient taking differently: Inject 120 Units into the skin every morning.  03/09/19   Arnetha Courser, MD    Physical Exam: Vitals:   10/10/20 1330 10/10/20 1400 10/10/20 1440 10/10/20 1500  BP: (!) 174/77 (!) 157/84 (!) 143/68 140/71  Pulse: 62 64 66 65  Resp: (!) 25 (!) 26 (!) 24 (!) 23  Temp:      TempSrc:      SpO2: 95% 98% 98% 94%  Weight:      Height:       General: Not in acute distress HEENT:       Eyes: PERRL, EOMI, no scleral icterus.       ENT: No discharge from the ears and nose, no pharynx injection, no tonsillar enlargement.        Neck: No JVD, no bruit, no mass felt. Heme: No neck lymph node enlargement. Cardiac: S1/S2, RRR, No murmurs, No gallops or rubs. Respiratory:  No rales, wheezing, rhonchi or rubs. GI: Soft, nondistended, nontender, no rebound pain, no organomegaly, BS present. GU: No hematuria Ext:  No pitting leg edema bilaterally. 2+DP/PT pulse bilaterally. Musculoskeletal: No joint deformities, No joint redness or warmth, no limitation of ROM in spin. Skin: has  diffuse erythematous rash to whole body which are itchy Neuro: Alert, oriented X3, cranial nerves II-XII grossly intact, moves all extremities normally.  Psych: Patient is not psychotic, no suicidal or hemocidal ideation.  Labs on Admission: I have personally reviewed following labs and imaging studies  CBC: Recent Labs  Lab 10/10/20 0832  WBC 6.1  NEUTROABS 3.4  HGB 14.1  HCT 41.5  MCV 96.7  PLT 702   Basic Metabolic Panel: Recent Labs  Lab 10/10/20 0832  NA 135  K 4.2  CL 99  CO2 22  GLUCOSE 286*  BUN 22*  CREATININE 0.87  CALCIUM 8.9   GFR: Estimated Creatinine Clearance: 98.8 mL/min (by C-G formula based on SCr of 0.87 mg/dL). Liver Function Tests: Recent Labs  Lab 10/10/20 0832  AST 32  ALT 31  ALKPHOS 72  BILITOT 0.8  PROT 6.8  ALBUMIN 4.2   No results for input(s): LIPASE, AMYLASE in the last 168 hours. No results for input(s): AMMONIA in the last 168 hours. Coagulation Profile: No results for input(s): INR, PROTIME in the last 168 hours. Cardiac Enzymes: No results for input(s): CKTOTAL, CKMB, CKMBINDEX, TROPONINI in the last 168 hours. BNP (last 3 results) No results for input(s): PROBNP in the last 8760 hours. HbA1C: No results for input(s): HGBA1C in the last 72 hours. CBG: No results for input(s): GLUCAP in the last 168 hours. Lipid Profile: No results for input(s): CHOL, HDL, LDLCALC, TRIG, CHOLHDL, LDLDIRECT in the last 72 hours. Thyroid Function Tests: No results for input(s): TSH, T4TOTAL, FREET4, T3FREE, THYROIDAB in the last 72 hours. Anemia Panel: No results for input(s): VITAMINB12, FOLATE, FERRITIN, TIBC, IRON, RETICCTPCT in the last 72 hours. Urine analysis:    Component Value Date/Time   COLORURINE YELLOW (A) 05/12/2018 0204   APPEARANCEUR CLOUDY (A) 05/12/2018  0204   APPEARANCEUR Clear 12/12/2015 1409   LABSPEC 1.015 05/12/2018 0204   LABSPEC 1.031 04/02/2015 1723   PHURINE 7.0 05/12/2018 0204   GLUCOSEU NEGATIVE 05/12/2018 0204   GLUCOSEU >=500 04/02/2015 1723   HGBUR NEGATIVE 05/12/2018 0204   BILIRUBINUR negative 05/08/2020 1424   BILIRUBINUR negative 09/07/2016 1500   BILIRUBINUR Negative 12/12/2015 1409   BILIRUBINUR Negative 04/02/2015 1723   KETONESUR negative 05/08/2020 1424   KETONESUR NEGATIVE 05/12/2018 0204   PROTEINUR negative 05/08/2020 1424   PROTEINUR NEGATIVE 05/12/2018 0204   UROBILINOGEN 0.2 05/08/2020 1424   NITRITE Negative 05/08/2020 1424   NITRITE NEGATIVE 05/12/2018 0204   LEUKOCYTESUR Trace (A) 05/08/2020 1424   LEUKOCYTESUR Negative 12/12/2015 1409   LEUKOCYTESUR Negative 04/02/2015 1723   Sepsis Labs: '@LABRCNTIP' (procalcitonin:4,lacticidven:4) ) Recent Results (from the past 240 hour(s))  Respiratory Panel by RT PCR (Flu A&B, Covid) - Nasopharyngeal Swab     Status: None   Collection Time: 10/10/20  1:41 PM   Specimen: Nasopharyngeal Swab  Result Value Ref Range Status   SARS Coronavirus 2 by RT PCR NEGATIVE NEGATIVE Final    Comment: (NOTE) SARS-CoV-2 target nucleic acids are NOT DETECTED.  The SARS-CoV-2 RNA is generally detectable in upper respiratoy specimens during the acute phase of infection. The lowest concentration of SARS-CoV-2 viral copies this assay can detect is 131 copies/mL. A negative result does not preclude SARS-Cov-2 infection and should not be used as the sole basis for treatment or other patient management decisions. A negative result may occur with  improper specimen collection/handling, submission of specimen other than nasopharyngeal swab, presence of viral mutation(s) within the areas targeted by this assay, and inadequate number of viral  copies (<131 copies/mL). A negative result must be combined with clinical observations, patient history, and epidemiological information.  The expected result is Negative.  Fact Sheet for Patients:  PinkCheek.be  Fact Sheet for Healthcare Providers:  GravelBags.it  This test is no t yet approved or cleared by the [place] FDA and  has been authorized for detection and/or diagnosis of SARS-CoV-2 by FDA under an Emergency Use Authorization (EUA). This EUA will remain  in effect (meaning this test can be used) for the duration of the COVID-19 declaration under Section 564(b)(1) of the Act, 21 U.S.C. section 360bbb-3(b)(1), unless the authorization is terminated or revoked sooner.     Influenza A by PCR NEGATIVE NEGATIVE Final   Influenza B by PCR NEGATIVE NEGATIVE Final    Comment: (NOTE) The Xpert Xpress SARS-CoV-2/FLU/RSV assay is intended as an aid in  the diagnosis of influenza from Nasopharyngeal swab specimens and  should not be used as a sole basis for treatment. Nasal washings and  aspirates are unacceptable for Xpert Xpress SARS-CoV-2/FLU/RSV  testing.  Fact Sheet for Patients: PinkCheek.be  Fact Sheet for Healthcare Providers: GravelBags.it  This test is not yet approved or cleared by the [place] FDA and  has been authorized for detection and/or diagnosis of SARS-CoV-2 by  FDA under an Emergency Use Authorization (EUA). This EUA will remain  in effect (meaning this test can be used) for the duration of the  Covid-19 declaration under Section 564(b)(1) of the Act, 21  U.S.C. section 360bbb-3(b)(1), unless the authorization is  terminated or revoked. Performed at [facility], [address] [place],  [address]      Radiological Exams on Admission: DG Chest 2 View  Result Date: 10/10/2020 CLINICAL DATA:  59 year old female with shortness of breath, hives and itching. EXAM: CHEST - 2 VIEW COMPARISON:  Chest radiographs 07/13/2014 and earlier. FINDINGS: Lung  volumes and mediastinal contours remain normal. Visualized tracheal air column is within normal limits. New pulmonary vascular congestion in both lungs. No overt edema. No pleural effusion. No pneumothorax or confluent pulmonary opacity. No acute osseous abnormality identified. Negative visible bowel gas pattern. IMPRESSION: 1. New pulmonary vascular congestion, consider mild or developing interstitial edema. 2. No other cardiopulmonary abnormality. Electronically Signed   By: Genevie Ann M.D.   On: 10/10/2020 11:35     EKG: I have personally reviewed.  Sinus rhythm, QTC 477, low voltage, poor R wave progression  Assessment/Plan Principal Problem:   Allergic reaction Active Problems:   Essential hypertension   Uncontrolled diabetes mellitus type 2 with atherosclerosis of arteries of extremities (HCC)   Tobacco use disorder   Depression   Anxiety   Seizures (HCC)   Essential tremor   Allergic reaction:  -place in progressive bed for observation -Pepcid 20 mg twice daily, hydroxyzine 25 mg 3 times daily, Solu-Medrol 60 mg twice daily, as needed EpiPen -IV fluid: 1 L normal saline was given  Essential hypertension -IV hydralazine as needed -Propranolol -HCTZ, lisinopril  Uncontrolled diabetes mellitus type 2 with atherosclerosis of arteries of extremities ([place]): Recent A1c 8.3, poorly controlled.  Patient is taking Humalog, [place], Actos, Tresiba.  Patient states that she is taking 100 units of Tresiba daily -Sliding scale insulin -Lantus 70 units daily  Tobacco use disorder -Nicotine patch  Depression and  Anxiety -Paxil, Lamictal, Klonopin  Seizures ([place]): Patient is taking Lamictal, but patient is not sure if she has history of seizure or not -Patient is on Lamictal  Essential tremor -Propranolol -Discontinue  primidone     DVT ppx: SQ Lovenox Code Status: Full code Family Communication: not done, no family member is at bed side.    Disposition Plan:  Anticipate  discharge back to previous environment Consults called:  none Admission status:   progressive unit for obs    Status is: Observation  The patient remains OBS appropriate and will d/c before 2 midnights.  Dispo: The patient is from: Home              Anticipated d/c is to: Home              Anticipated d/c date is: 1 day              Patient currently is not medically stable to d/c.          Date of Service 10/10/2020    Ivor Costa [facility]   If 7PM-7AM, please contact night-coverage [email or web address] 10/10/2020, 4:16 PM

## 2020-10-11 DIAGNOSIS — G250 Essential tremor: Secondary | ICD-10-CM | POA: Diagnosis not present

## 2020-10-11 DIAGNOSIS — T7840XA Allergy, unspecified, initial encounter: Secondary | ICD-10-CM | POA: Diagnosis not present

## 2020-10-11 DIAGNOSIS — T782XXA Anaphylactic shock, unspecified, initial encounter: Secondary | ICD-10-CM | POA: Diagnosis not present

## 2020-10-11 DIAGNOSIS — I10 Essential (primary) hypertension: Secondary | ICD-10-CM | POA: Diagnosis not present

## 2020-10-11 LAB — GLUCOSE, CAPILLARY
Glucose-Capillary: 320 mg/dL — ABNORMAL HIGH (ref 70–99)
Glucose-Capillary: 360 mg/dL — ABNORMAL HIGH (ref 70–99)
Glucose-Capillary: 387 mg/dL — ABNORMAL HIGH (ref 70–99)
Glucose-Capillary: 341 mg/dL — ABNORMAL HIGH (ref 70–99)

## 2020-10-11 LAB — BASIC METABOLIC PANEL
Anion gap: 13 (ref 5–15)
BUN: 26 mg/dL — ABNORMAL HIGH (ref 6–20)
CO2: 22 mmol/L (ref 22–32)
Calcium: 9.6 mg/dL (ref 8.9–10.3)
Chloride: 98 mmol/L (ref 98–111)
Creatinine, Ser: 1 mg/dL (ref 0.44–1.00)
GFR, Estimated: >60 mL/min (ref >60)
Glucose, Bld: 357 mg/dL — ABNORMAL HIGH (ref 70–99)
Potassium: 4.3 mmol/L (ref 3.5–5.1)
Sodium: 133 mmol/L — ABNORMAL LOW (ref 135–145)

## 2020-10-11 LAB — CBC
HCT: 42.2 % (ref 36.0–46.0)
Hemoglobin: 14.7 g/dL (ref 12.0–15.0)
MCH: 32.9 pg (ref 26.0–34.0)
MCHC: 34.8 g/dL (ref 30.0–36.0)
MCV: 94.4 fL (ref 80.0–100.0)
Platelets: 255 10*3/uL (ref 150–400)
RBC: 4.47 MIL/uL (ref 3.87–5.11)
RDW: 13.1 % (ref 11.5–15.5)
WBC: 11.1 10*3/uL — ABNORMAL HIGH (ref 4.0–10.5)
nRBC: 0 % (ref 0.0–0.2)

## 2020-10-11 LAB — HIV ANTIBODY (ROUTINE TESTING W REFLEX): HIV Screen 4th Generation wRfx: NONREACTIVE

## 2020-10-11 MED ORDER — DM-GUAIFENESIN ER 30-600 MG PO TB12
1.0000 | ORAL_TABLET | Freq: Two times a day (BID) | ORAL | Status: DC
  Administered 2020-10-11 – 2020-10-12 (×3): 1 via ORAL
  Filled 2020-10-11 (×3): qty 1

## 2020-10-11 MED ORDER — HYDROXYZINE HCL 50 MG/ML IM SOLN
25.0000 mg | Freq: Once | INTRAMUSCULAR | Status: AC
  Administered 2020-10-11: 25 mg via INTRAMUSCULAR
  Filled 2020-10-11: qty 0.5

## 2020-10-11 NOTE — Progress Notes (Signed)
PROGRESS NOTE    Debra [name]  TMH:[id number] DOB: 1961/05/21 DOA: 10/10/2020 PCP: Linard Millers, MD    Assessment & Plan:   Principal Problem:   Allergic reaction Active Problems:   Essential hypertension   Uncontrolled diabetes mellitus type 2 with atherosclerosis of arteries of extremities (HCC)   Tobacco use disorder   Depression   Anxiety   Seizures (HCC)   Essential tremor   Allergic reaction: possibly secondary to primidone which was d/c. Still w/ significant rash, itching & swelling. Continue on pepcid, hydroxyzine, solumedrol. Allergic to benadryl. Recommend outpatient f/u w/ an allergist   HTN: continue on propranolol, HCTZ, lisinopril. IV hydralazine prn   DM2: uncontrolled w/ HbA1c 8.3. Recent A1c 8.3, poorly controlled. Hold home anti-DM meds. Continue on lantus,SSI w/ accuchecks  Tobacco use disorder: nicotine patch to prevent w/drawal. Smoking cessation counseling   Depression: severity unknown. Continue on home dose of paxil  Anxiety: severity unknown. Continue on home dose of klonopin   Possible seizure hx: continue on home dose of lamictal   Essential tremor: continue on propranolol. Primidone was d/c    Morbid obesity: BMI 47.6. Complicates overall care & prognosis    DVT prophylaxis: lovenox  Code Status: full  Family Communication:  Disposition Plan: likely will d/c back home  Status is: Observation  The patient remains OBS appropriate and will d/c before 2 midnights.  Dispo: The patient is from: Home              Anticipated d/c is to: Home              Anticipated d/c date is: 1 day              Patient currently is not medically stable to d/c.     Consultants:      Procedures:    Antimicrobials:   Subjective: Pt c/o severe itching and rash  Objective: Vitals:   10/10/20 1848 10/10/20 2211 10/10/20 2213 10/11/20 0526  BP: (!) 146/71 123/66 123/66 (!) 126/52  Pulse: 67 72 72 66  Resp: 18   20  Temp: 98.6 F  (37 C)   98.4 F (36.9 C)  TempSrc:    Oral  SpO2: 94%   93%  Weight:    130.8 kg  Height:        Intake/Output Summary (Last 24 hours) at 10/11/2020 0714 Last data filed at 10/10/2020 2300 Gross per 24 hour  Intake 526.68 ml  Output 1 ml  Net 525.68 ml   Filed Weights   10/10/20 0817 10/11/20 0526  Weight: 132.9 kg 130.8 kg    Examination:  General exam: Appears tearful and uncomfortable Respiratory system: decreased breath sounds b/l.  Cardiovascular system: S1 & S2 +. No rubs, gallops or clicks.  Gastrointestinal system: Abdomen is obese, soft and nontender. Normal bowel sounds heard. Central nervous system: Alert and oriented. Moves all 4 extremities. Tremor noted. Psychiatry: Judgement and insight appear normal. Mood & affect appropriate.  Skin: diffuse erythematous rash    Data Reviewed: I have personally reviewed following labs and imaging studies  CBC: Recent Labs  Lab 10/10/20 0832 10/11/20 0438  WBC 6.1 11.1*  NEUTROABS 3.4  --   HGB 14.1 14.7  HCT 41.5 42.2  MCV 96.7 94.4  PLT 244 255   Basic Metabolic Panel: Recent Labs  Lab 10/10/20 0832 10/11/20 0438  NA 135 133*  K 4.2 4.3  CL 99 98  CO2 22 22  GLUCOSE 286* 357*  BUN 22* 26*  CREATININE 0.87 1.00  CALCIUM 8.9 9.6   GFR: Estimated Creatinine Clearance: 85.1 mL/min (by C-G formula based on SCr of 1 mg/dL). Liver Function Tests: Recent Labs  Lab 10/10/20 0832  AST 32  ALT 31  ALKPHOS 72  BILITOT 0.8  PROT 6.8  ALBUMIN 4.2   No results for input(s): LIPASE, AMYLASE in the last 168 hours. No results for input(s): AMMONIA in the last 168 hours. Coagulation Profile: No results for input(s): INR, PROTIME in the last 168 hours. Cardiac Enzymes: No results for input(s): CKTOTAL, CKMB, CKMBINDEX, TROPONINI in the last 168 hours. BNP (last 3 results) No results for input(s): PROBNP in the last 8760 hours. HbA1C: No results for input(s): HGBA1C in the last 72 hours. CBG: Recent Labs    Lab 10/10/20 1639 10/10/20 2049  GLUCAP 285* 458*   Lipid Profile: No results for input(s): CHOL, HDL, LDLCALC, TRIG, CHOLHDL, LDLDIRECT in the last 72 hours. Thyroid Function Tests: No results for input(s): TSH, T4TOTAL, FREET4, T3FREE, THYROIDAB in the last 72 hours. Anemia Panel: No results for input(s): VITAMINB12, FOLATE, FERRITIN, TIBC, IRON, RETICCTPCT in the last 72 hours. Sepsis Labs: No results for input(s): PROCALCITON, LATICACIDVEN in the last 168 hours.  Recent Results (from the past 240 hour(s))  Respiratory Panel by RT PCR (Flu A&B, Covid) - Nasopharyngeal Swab     Status: None   Collection Time: 10/10/20  1:41 PM   Specimen: Nasopharyngeal Swab  Result Value Ref Range Status   SARS Coronavirus 2 by RT PCR NEGATIVE NEGATIVE Final    Comment: (NOTE) SARS-CoV-2 target nucleic acids are NOT DETECTED.  The SARS-CoV-2 RNA is generally detectable in upper respiratoy specimens during the acute phase of infection. The lowest concentration of SARS-CoV-2 viral copies this assay can detect is 131 copies/mL. A negative result does not preclude SARS-Cov-2 infection and should not be used as the sole basis for treatment or other patient management decisions. A negative result may occur with  improper specimen collection/handling, submission of specimen other than nasopharyngeal swab, presence of viral mutation(s) within the areas targeted by this assay, and inadequate number of viral copies (<131 copies/mL). A negative result must be combined with clinical observations, patient history, and epidemiological information. The expected result is Negative.  Fact Sheet for Patients:  https://www.moore.com/  Fact Sheet for Healthcare Providers:  https://www.young.biz/  This test is no t yet approved or cleared by the [place] FDA and  has been authorized for detection and/or diagnosis of SARS-CoV-2 by FDA under an Emergency Use  Authorization (EUA). This EUA will remain  in effect (meaning this test can be used) for the duration of the COVID-19 declaration under Section 564(b)(1) of the Act, 21 U.S.C. section 360bbb-3(b)(1), unless the authorization is terminated or revoked sooner.     Influenza A by PCR NEGATIVE NEGATIVE Final   Influenza B by PCR NEGATIVE NEGATIVE Final    Comment: (NOTE) The Xpert Xpress SARS-CoV-2/FLU/RSV assay is intended as an aid in  the diagnosis of influenza from Nasopharyngeal swab specimens and  should not be used as a sole basis for treatment. Nasal washings and  aspirates are unacceptable for Xpert Xpress SARS-CoV-2/FLU/RSV  testing.  Fact Sheet for Patients: https://www.moore.com/  Fact Sheet for Healthcare Providers: https://www.young.biz/  This test is not yet approved or cleared by the [place] FDA and  has been authorized for detection and/or diagnosis of SARS-CoV-2 by  FDA under an Emergency Use Authorization (EUA). This EUA will remain  in effect (meaning this test can be used) for the duration of the  Covid-19 declaration under Section 564(b)(1) of the Act, 21  U.S.C. section 360bbb-3(b)(1), unless the authorization is  terminated or revoked. Performed at [facility], [address], [place], [place] [address]          Radiology Studies: DG Chest 2 View  Result Date: 10/10/2020 CLINICAL DATA:  59 year old female with shortness of breath, hives and itching. EXAM: CHEST - 2 VIEW COMPARISON:  Chest radiographs 07/13/2014 and earlier. FINDINGS: Lung volumes and mediastinal contours remain normal. Visualized tracheal air column is within normal limits. New pulmonary vascular congestion in both lungs. No overt edema. No pleural effusion. No pneumothorax or confluent pulmonary opacity. No acute osseous abnormality identified. Negative visible bowel gas pattern. IMPRESSION: 1. New pulmonary vascular congestion,  consider mild or developing interstitial edema. 2. No other cardiopulmonary abnormality. Electronically Signed   By: Odessa Fleming M.D.   On: 10/10/2020 11:35        Scheduled Meds: . aspirin EC  81 mg Oral Daily  . calcium carbonate  1 tablet Oral Q breakfast  . cholecalciferol  5,000 Units Oral Daily  . enoxaparin (LOVENOX) injection  0.5 mg/kg Subcutaneous Q24H  . folic acid  0.5 mg Oral Daily  . hydrochlorothiazide  25 mg Oral Daily  . hydrOXYzine  25 mg Oral TID  . insulin aspart  0-5 Units Subcutaneous QHS  . insulin aspart  0-9 Units Subcutaneous TID WC  . insulin glargine  70 Units Subcutaneous Daily  . lamoTRIgine  150 mg Oral Daily  . lisinopril  40 mg Oral Daily  . melatonin  10 mg Oral QHS  . methylPREDNISolone (SOLU-MEDROL) injection  60 mg Intravenous Q12H  . multivitamin with minerals  1 tablet Oral Daily  . nicotine  21 mg Transdermal Daily  . PARoxetine  40 mg Oral Q0600  . propranolol ER  80 mg Oral Daily   Continuous Infusions: . famotidine (PEPCID) IV Stopped (10/10/20 2257)     LOS: 0 days    Time spent: 32 mins    Charise Killian, MD [facility] Pager [phone]  If 7PM-7AM, please contact night-coverage [email or web address] 10/11/2020, 7:14 AM

## 2020-10-11 NOTE — Progress Notes (Signed)
Inpatient Diabetes Program Recommendations  AACE/ADA: New Consensus Statement on Inpatient Glycemic Control (2015)  Target Ranges:  Prepandial:   less than 140 mg/dL      Peak postprandial:   less than 180 mg/dL (1-2 hours)      Critically ill patients:  140 - 180 mg/dL   Results for Debra [name], Debra [name] (MRN [id number]) as of 10/11/2020 09:50  Ref. Range 10/10/2020 16:39 10/10/2020 20:49 10/11/2020 08:25  Glucose-Capillary Latest Ref Range: 70 - 99 mg/dL  409 mg Solumedrol @8 :39am 285 (H)  5 units NOVOLOG  458 (H)  7 units NOVOLOG +  70 units LANTUS  60 mg Solumedrol 320 (H)  7 units NOVOLOG +  70 units LANTUS    Admit: Admit Allergic Reaction to new Medication (Primidone for essential tremor)  History: Diabetes 2  Home DM Meds: Tresiba 120 units Daily        Humalog 20 units Breakfast/ 35 units Lunch/ 35 units Dinner         Farxiga 10 mg Daily        Actos 30 mg Daily  Current: Orders: Lantus 70 units Daily       Novolog Sensitive Correction Scale/ SSI (0-9 units) TID AC + HS    Current A1c= 7.8% (visit w/ ENDO (Dr. ) on 06/25/2020--No changes made to regimen at that visit   Got 125 mg Solumedrol X 1 dose yest at 8am and now getting Solumedrol 60 mg BID.     MD- Note Novolog started yest at 5pm and Lantus started at bedtime last PM--Another 70 units Lantus on board this AM.  Please consider:  1. Increase Lantus to 95 units Daily tomorrow  AM (11/06) (80% total home dose)  2. Start Novolog Meal Coverage: Novolog 10 units with Breakfast/ 15 units with Lunch/ 15 units with Dinner (50% home dose)     --Will follow patient during hospitalization--  08-10-1982 RN, MSN, CDE Diabetes Coordinator Inpatient Glycemic Control Team Team Pager: [phone] (8a-5p)

## 2020-10-12 DIAGNOSIS — G250 Essential tremor: Secondary | ICD-10-CM | POA: Diagnosis not present

## 2020-10-12 DIAGNOSIS — T7840XA Allergy, unspecified, initial encounter: Secondary | ICD-10-CM | POA: Diagnosis not present

## 2020-10-12 DIAGNOSIS — I10 Essential (primary) hypertension: Secondary | ICD-10-CM | POA: Diagnosis not present

## 2020-10-12 DIAGNOSIS — T782XXA Anaphylactic shock, unspecified, initial encounter: Secondary | ICD-10-CM | POA: Diagnosis not present

## 2020-10-12 LAB — BASIC METABOLIC PANEL
Anion gap: 13 (ref 5–15)
BUN: 31 mg/dL — ABNORMAL HIGH (ref 6–20)
CO2: 23 mmol/L (ref 22–32)
Calcium: 9.4 mg/dL (ref 8.9–10.3)
Chloride: 95 mmol/L — ABNORMAL LOW (ref 98–111)
Creatinine, Ser: 0.93 mg/dL (ref 0.44–1.00)
GFR, Estimated: >60 mL/min (ref >60)
Glucose, Bld: 334 mg/dL — ABNORMAL HIGH (ref 70–99)
Potassium: 4.3 mmol/L (ref 3.5–5.1)
Sodium: 131 mmol/L — ABNORMAL LOW (ref 135–145)

## 2020-10-12 LAB — CBC
HCT: 41.3 % (ref 36.0–46.0)
Hemoglobin: 14.2 g/dL (ref 12.0–15.0)
MCH: 32.6 pg (ref 26.0–34.0)
MCHC: 34.4 g/dL (ref 30.0–36.0)
MCV: 94.7 fL (ref 80.0–100.0)
Platelets: 272 10*3/uL (ref 150–400)
RBC: 4.36 MIL/uL (ref 3.87–5.11)
RDW: 13.1 % (ref 11.5–15.5)
WBC: 13.2 10*3/uL — ABNORMAL HIGH (ref 4.0–10.5)
nRBC: 0 % (ref 0.0–0.2)

## 2020-10-12 LAB — GLUCOSE, CAPILLARY: Glucose-Capillary: 317 mg/dL — ABNORMAL HIGH (ref 70–99)

## 2020-10-12 MED ORDER — HYDROXYZINE HCL 25 MG PO TABS: 25.0000 mg | ORAL_TABLET | Freq: Three times a day (TID) | ORAL | 0 refills | Status: AC

## 2020-10-12 MED ORDER — EPINEPHRINE 0.3 MG/0.3ML IJ SOAJ: 0.3000 mg | INTRAMUSCULAR | 0 refills | Status: AC | PRN

## 2020-10-12 MED ORDER — HYDROCORTISONE 1 % EX CREA: TOPICAL_CREAM | Freq: Four times a day (QID) | CUTANEOUS | 0 refills | Status: DC | PRN

## 2020-10-12 MED ORDER — FAMOTIDINE 20 MG PO TABS: 20.0000 mg | ORAL_TABLET | Freq: Every day | ORAL | 0 refills | Status: DC

## 2020-10-12 MED ORDER — PREDNISONE 20 MG PO TABS: 40.0000 mg | ORAL_TABLET | Freq: Every day | ORAL | 0 refills | Status: AC

## 2020-10-12 NOTE — Plan of Care (Signed)
Problem: Education: Goal: Knowledge of General Education information will improve Description: Including pain rating scale, medication(s)/side effects and non-pharmacologic comfort measures 10/12/2020 1145 by Ansel Bong, RN Outcome: Adequate for Discharge 10/12/2020 1145 by Ansel Bong, RN Outcome: Adequate for Discharge   Problem: Health Behavior/Discharge Planning: Goal: Ability to manage health-related needs will improve 10/12/2020 1145 by Ansel Bong, RN Outcome: Adequate for Discharge 10/12/2020 1145 by Ansel Bong, RN Outcome: Adequate for Discharge   Problem: Clinical Measurements: Goal: Ability to maintain clinical measurements within normal limits will improve 10/12/2020 1145 by Ansel Bong, RN Outcome: Adequate for Discharge 10/12/2020 1145 by Ansel Bong, RN Outcome: Adequate for Discharge Goal: Will remain free from infection 10/12/2020 1145 by Ansel Bong, RN Outcome: Adequate for Discharge 10/12/2020 1145 by Ansel Bong, RN Outcome: Adequate for Discharge Goal: Diagnostic test results will improve 10/12/2020 1145 by Ansel Bong, RN Outcome: Adequate for Discharge 10/12/2020 1145 by Ansel Bong, RN Outcome: Adequate for Discharge Goal: Respiratory complications will improve 10/12/2020 1145 by Ansel Bong, RN Outcome: Adequate for Discharge 10/12/2020 1145 by Ansel Bong, RN Outcome: Adequate for Discharge Goal: Cardiovascular complication will be avoided 10/12/2020 1145 by Ansel Bong, RN Outcome: Adequate for Discharge 10/12/2020 1145 by Ansel Bong, RN Outcome: Adequate for Discharge   Problem: Activity: Goal: Risk for activity intolerance will decrease 10/12/2020 1145 by Ansel Bong, RN Outcome: Adequate for Discharge 10/12/2020 1145 by Ansel Bong, RN Outcome: Adequate for Discharge   Problem: Nutrition: Goal: Adequate nutrition will be maintained 10/12/2020 1145 by Ansel Bong, RN Outcome: Adequate for  Discharge 10/12/2020 1145 by Ansel Bong, RN Outcome: Adequate for Discharge   Problem: Coping: Goal: Level of anxiety will decrease 10/12/2020 1145 by Ansel Bong, RN Outcome: Adequate for Discharge 10/12/2020 1145 by Ansel Bong, RN Outcome: Adequate for Discharge   Problem: Elimination: Goal: Will not experience complications related to bowel motility 10/12/2020 1145 by Ansel Bong, RN Outcome: Adequate for Discharge 10/12/2020 1145 by Ansel Bong, RN Outcome: Adequate for Discharge Goal: Will not experience complications related to urinary retention 10/12/2020 1145 by Ansel Bong, RN Outcome: Adequate for Discharge 10/12/2020 1145 by Ansel Bong, RN Outcome: Adequate for Discharge   Problem: Pain Managment: Goal: General experience of comfort will improve 10/12/2020 1145 by Ansel Bong, RN Outcome: Adequate for Discharge 10/12/2020 1145 by Ansel Bong, RN Outcome: Adequate for Discharge   Problem: Safety: Goal: Ability to remain free from injury will improve 10/12/2020 1145 by Ansel Bong, RN Outcome: Adequate for Discharge 10/12/2020 1145 by Ansel Bong, RN Outcome: Adequate for Discharge   Problem: Skin Integrity: Goal: Risk for impaired skin integrity will decrease 10/12/2020 1145 by Ansel Bong, RN Outcome: Adequate for Discharge 10/12/2020 1145 by Ansel Bong, RN Outcome: Adequate for Discharge   Problem: Education: Goal: Expressions of having a comfortable level of knowledge regarding the disease process will increase 10/12/2020 1145 by Ansel Bong, RN Outcome: Adequate for Discharge 10/12/2020 1145 by Ansel Bong, RN Outcome: Adequate for Discharge   Problem: Coping: Goal: Ability to adjust to condition or change in health will improve 10/12/2020 1145 by Ansel Bong, RN Outcome: Adequate for Discharge 10/12/2020 1145 by Ansel Bong, RN Outcome: Adequate for Discharge Goal: Ability to identify appropriate support  needs will improve 10/12/2020 1145 by Ansel Bong, RN Outcome: Adequate for Discharge 10/12/2020 1145 by Ansel Bong, RN Outcome: Adequate for Discharge   Problem: Health Behavior/Discharge Planning: Goal: Compliance with prescribed medication regimen will improve 10/12/2020 1145 by Ansel Bong, RN Outcome: Adequate for Discharge 10/12/2020 1145 by Ansel Bong,  RN Outcome: Adequate for Discharge   Problem: Medication: Goal: Risk for medication side effects will decrease 10/12/2020 1145 by Ansel Bong, RN Outcome: Adequate for Discharge 10/12/2020 1145 by Ansel Bong, RN Outcome: Adequate for Discharge   Problem: Clinical Measurements: Goal: Complications related to the disease process, condition or treatment will be avoided or minimized 10/12/2020 1145 by Ansel Bong, RN Outcome: Adequate for Discharge 10/12/2020 1145 by Ansel Bong, RN Outcome: Adequate for Discharge Goal: Diagnostic test results will improve 10/12/2020 1145 by Ansel Bong, RN Outcome: Adequate for Discharge 10/12/2020 1145 by Ansel Bong, RN Outcome: Adequate for Discharge   Problem: Safety: Goal: Verbalization of understanding the information provided will improve 10/12/2020 1145 by Ansel Bong, RN Outcome: Adequate for Discharge 10/12/2020 1145 by Ansel Bong, RN Outcome: Adequate for Discharge   Problem: Self-Concept: Goal: Level of anxiety will decrease 10/12/2020 1145 by Ansel Bong, RN Outcome: Adequate for Discharge 10/12/2020 1145 by Ansel Bong, RN Outcome: Adequate for Discharge Goal: Ability to verbalize feelings about condition will improve 10/12/2020 1145 by Ansel Bong, RN Outcome: Adequate for Discharge 10/12/2020 1145 by Ansel Bong, RN Outcome: Adequate for Discharge

## 2020-10-12 NOTE — Discharge Summary (Signed)
Physician Discharge Summary  Debra [name] ZDG:[id number] DOB: [birth year] DOA: 10/10/2020  PCP: Forbes Cellar, MD  Admit date: 10/10/2020 Discharge date: 10/12/2020  Admitted From: home  Disposition: home   Recommendations for Outpatient Follow-up:  1. Follow up with PCP in 1-2 weeks   Home Health: no  Equipment/Devices:  Discharge Condition: stable  CODE STATUS: full  Diet recommendation: Heart Healthy / Carb Modified   Brief/Interim Summary: HPI was taken from Dr. Blaine Hamper: Debra [name] is a 59 y.o. female with medical history significant of hypertension, hyperlipidemia, diabetes mellitus, depression, anxiety, seizure, tobacco abuse, C. difficile colitis, bipolar, PTSD, essential tremor, who presents with allergic reaction.  Pt states that she has newly started taking primidone for essential tremor in the past 3 days. She developed allergic reaction, including diffused erythema rash, itchiness to whole body.  She also feels itchy in throat and tongue.  She denies shortness of breath, no feeling of throat closing.  Denies chest pain, fever or chills.  Patient has some mild dry cough. No nausea vomiting, diarrhea, abdominal pain, symptoms of UTI or unilateral weakness.  Patient was given 20 mg of Pepcid, 25 mg of hydroxyzine, 125 mg of Solu-Medrol, EpiPen 0.3 mg x2 in ED.   ED Course: pt was found to have WBC 6.1, negative Covid PCR, electrolytes renal function okay, temperature normal, blood pressure 176/110, heart rate 91, RR 23, oxygen saturation 92-100% on room air.  Chest x-ray showed vascular congestion. Pt is placed on progressive benefit observation.  Hospital Course from Dr. Lenise Herald 11/5-11/6/21: Pt presented w/ allergic reaction after taking primidone for an essential tremor. Primidone was d/c and put on pt's allergy list. Pt was treated w/ pepcid, hydroxyzine, solumedrol as pt is allergic to benadryl. Pt symptoms were improving at time of d/c but not yet resolved at time  of d/c. For more information, please see other progress notes for more information.   Discharge Diagnoses:  Principal Problem:   Allergic reaction Active Problems:   Essential hypertension   Uncontrolled diabetes mellitus type 2 with atherosclerosis of arteries of extremities (HCC)   Tobacco use disorder   Depression   Anxiety   Seizures (HCC)   Essential tremor  Allergic reaction: possibly secondary to primidone which was d/c. Still w/ significant rash, itching & swelling. Continue on pepcid, hydroxyzine, solumedrol. Allergic to benadryl. Recommend outpatient f/u w/ an allergist   HTN: continue on propranolol, HCTZ, lisinopril. IV hydralazine prn   DM2: uncontrolled w/ HbA1c 8.3. Recent A1c 8.3, poorly controlled. Hold home anti-DM meds. Continue on lantus,SSI w/ accuchecks  Tobacco use disorder: nicotine patch to prevent w/drawal. Smoking cessation counseling   Depression: severity unknown. Continue on home dose of paxil  Anxiety: severity unknown. Continue on home dose of klonopin   Possible seizure hx: continue on home dose of lamictal   Essential tremor: continue on propranolol. Primidone was d/c    Morbid obesity: BMI 47.6. Complicates overall care & prognosis   Discharge Instructions  Discharge Instructions    Diet - low sodium heart healthy   Complete by: As directed    Diet Carb Modified   Complete by: As directed    Discharge instructions   Complete by: As directed    F/u PCP in 1 week   Increase activity slowly   Complete by: As directed      Allergies as of 10/12/2020      Reactions   Clindamycin/lincomycin    Pt states caused C-Diff   Montelukast  Hives, Rash   Asthma attacks Asthma attacks   Sulfasalazine Hives   Other reaction(s): Unknown   Metformin And Related Diarrhea   Benadryl [diphenhydramine Hcl] Swelling   blistering   Canagliflozin Other (See Comments)   Other reaction(s): Other (See Comments) Low bp Low bp Low bp   Depakote Er  [divalproex Sodium Er] Other (See Comments)   seizures   Dilaudid [hydromorphone Hcl] Other (See Comments)   seizures   Diphenhydramine-zinc Acetate Other (See Comments)   Erythromycin    Ethanol    Haloperidol Other (See Comments)   Hydromorphone Other (See Comments)   Neosporin [neomycin-bacitracin Zn-polymyx] Swelling   blistering   Primidone    Diffused erythematous and itchy rash   Singulair [montelukast Sodium] Other (See Comments)   Asthma attacks   Sitagliptin Other (See Comments)   Statins Other (See Comments)   Other reaction(s): Unknown   Sulfa Antibiotics Hives   Other reaction(s): Unknown   Victoza [liraglutide] Other (See Comments), Nausea And Vomiting   Other reaction(s): Other (See Comments) pancreatitis pancreatitis      Medication List    STOP taking these medications   doxycycline 100 MG capsule Commonly known as: VIBRAMYCIN   fluconazole 150 MG tablet Commonly known as: DIFLUCAN   HYDROcodone-acetaminophen 5-325 MG tablet Commonly known as: NORCO/VICODIN   propranolol ER 80 MG 24 hr capsule Commonly known as: INDERAL LA     TAKE these medications   aspirin EC 81 MG tablet Take 81 mg by mouth daily.   blood glucose meter kit and supplies Use up to four times daily as directed, Dx E11.65, LON 99 months   calcium carbonate 1500 (600 Ca) MG Tabs tablet Commonly known as: OSCAL Take 1 tablet by mouth daily with breakfast.   Calcium Carbonate-Vitamin D 600-200 MG-UNIT Tabs Take by mouth.   Centrum Women Tabs Take 1 tablet by mouth.   Cholecalciferol 125 MCG (5000 UT) capsule Take 5,000 Units by mouth.   clonazePAM 0.5 MG tablet Commonly known as: KlonoPIN Take 1 tablet (0.5 mg total) by mouth as directed. Take one tablet once a day as needed up to 2-3 times a week only for severe anxiety attacks   clotrimazole-betamethasone cream Commonly known as: Lotrisone Apply 1 application topically 2 (two) times daily. Sparingly to affected skin  for up to 7 d at a time   EPINEPHrine 0.3 mg/0.3 mL Soaj injection Commonly known as: EPI-PEN Inject 0.3 mg into the muscle as needed for up to 1 day for anaphylaxis (Anaphylaxis reaction).   famotidine 20 MG tablet Commonly known as: Pepcid Take 1 tablet (20 mg total) by mouth daily.   Farxiga 10 MG Tabs tablet Generic drug: dapagliflozin propanediol TAKE 1 TABLET DAILY (HIGHER DOSE) What changed: See the new instructions.   folic acid 628 MCG tablet Commonly known as: FOLVITE Take 800 mcg by mouth daily.   FreeStyle Libre 14 Day Reader Kerrin Mo 1 Device.   FreeStyle Libre 14 Day Sensor Misc 1 kit.   glucose blood test strip Commonly known as: ONE TOUCH ULTRA TEST USE UP TO FOUR TIMES A DAY AS DIRECTED; Dx 11.51, LON 99 months   HumaLOG KwikPen 100 UNIT/ML KwikPen Generic drug: insulin lispro INJECT 20 UNITS UNDER THE SKIN IN THE MORNING AND 35 UNITS WITH LUNCH AND EVENING MEAL (INJECT WITH MEALS)   hydrochlorothiazide 25 MG tablet Commonly known as: HYDRODIURIL TAKE 1 TABLET DAILY (DOSE INCREASE, REPLACES ALL OTHER HCTZ PRESCRIPTIONS) What changed: See the new instructions.   hydrocortisone  cream 1 % Apply topically 4 (four) times daily as needed for itching.   hydrOXYzine 25 MG tablet Commonly known as: ATARAX/VISTARIL Take 1 tablet (25 mg total) by mouth 3 (three) times daily.   Insulin Pen Needle 29G X 12.7MM Misc 1 each by Does not apply route as needed.   lamoTRIgine 100 MG tablet Commonly known as: LAMICTAL TAKE ONE AND ONE-HALF TABLETS DAILY (DOSE INCREASE) What changed: See the new instructions.   lisinopril 40 MG tablet Commonly known as: ZESTRIL TAKE 1 TABLET DAILY   Melatonin 10 MG Tabs Take 1 tablet by mouth at bedtime.   methocarbamol 500 MG tablet Commonly known as: ROBAXIN Take 1 tablet (500 mg total) by mouth 2 (two) times daily.   Multi-Vitamin tablet Take 1 tablet by mouth daily.   OneTouch Delica Plus [id number] Misc CHECK FINGER  STICK BLOOD SUGARS FOUR TIMES A DAY   PARoxetine 40 MG tablet Commonly known as: PAXIL TAKE 1 TABLET EVERY MORNING What changed:   how much to take  how to take this  when to take this   phenazopyridine 200 MG tablet Commonly known as: PYRIDIUM Take 1 tablet (200 mg total) by mouth 3 (three) times daily.   pioglitazone 30 MG tablet Commonly known as: ACTOS TAKE 1 TABLET DAILY   predniSONE 20 MG tablet Commonly known as: Deltasone Take 2 tablets (40 mg total) by mouth daily for 10 days.   pregabalin 25 MG capsule Commonly known as: Lyrica Take 1 capsule (25 mg total) by mouth 2 (two) times daily.   propranolol 80 MG 24 hr capsule Commonly known as: INNOPRAN XL TAKE 1 CAPSULE DAILY   Repatha SureClick 347 MG/ML Soaj Generic drug: Evolocumab Inject 140 mg into the skin every 14 (fourteen) days.   Tyler Aas FlexTouch 200 UNIT/ML FlexTouch Pen Generic drug: insulin degludec Inject 120 Units into the skin at bedtime. What changed: when to take this       Allergies  Allergen Reactions  . Clindamycin/Lincomycin     Pt states caused C-Diff  . Montelukast Hives and Rash    Asthma attacks Asthma attacks  . Sulfasalazine Hives    Other reaction(s): Unknown  . Metformin And Related Diarrhea  . Benadryl [Diphenhydramine Hcl] Swelling    blistering  . Canagliflozin Other (See Comments)    Other reaction(s): Other (See Comments) Low bp Low bp Low bp  . Depakote Er [Divalproex Sodium Er] Other (See Comments)    seizures  . Dilaudid [Hydromorphone Hcl] Other (See Comments)    seizures  . Diphenhydramine-Zinc Acetate Other (See Comments)  . Erythromycin   . Ethanol   . Haloperidol Other (See Comments)  . Hydromorphone Other (See Comments)  . Neosporin [Neomycin-Bacitracin Zn-Polymyx] Swelling    blistering  . Primidone     Diffused erythematous and itchy rash  . Singulair [Montelukast Sodium] Other (See Comments)    Asthma attacks  . Sitagliptin Other (See  Comments)  . Statins Other (See Comments)    Other reaction(s): Unknown  . Sulfa Antibiotics Hives    Other reaction(s): Unknown  . Victoza [Liraglutide] Other (See Comments) and Nausea And Vomiting    Other reaction(s): Other (See Comments) pancreatitis pancreatitis    Consultations:     Procedures/Studies: DG Chest 2 View  Result Date: 10/10/2020 CLINICAL DATA:  59 year old female with shortness of breath, hives and itching. EXAM: CHEST - 2 VIEW COMPARISON:  Chest radiographs 07/13/2014 and earlier. FINDINGS: Lung volumes and mediastinal contours remain normal. Visualized tracheal air  column is within normal limits. New pulmonary vascular congestion in both lungs. No overt edema. No pleural effusion. No pneumothorax or confluent pulmonary opacity. No acute osseous abnormality identified. Negative visible bowel gas pattern. IMPRESSION: 1. New pulmonary vascular congestion, consider mild or developing interstitial edema. 2. No other cardiopulmonary abnormality. Electronically Signed   By: Genevie Ann M.D.   On: 10/10/2020 11:35       Subjective: Pt c/o itching but improved from day prior    Discharge Exam: Vitals:   10/12/20 0344 10/12/20 0727  BP: 132/65 (!) 162/69  Pulse: (!) 57 (!) 54  Resp: 20 19  Temp: 97.6 F (36.4 C) 98.5 F (36.9 C)  SpO2: 92% 95%   Vitals:   10/11/20 1909 10/12/20 0344 10/12/20 0526 10/12/20 0727  BP: 135/61 132/65  (!) 162/69  Pulse: 62 (!) 57  (!) 54  Resp: '19 20  19  ' Temp: 98.7 F (37.1 C) 97.6 F (36.4 C)  98.5 F (36.9 C)  TempSrc: Oral Oral  Oral  SpO2: 93% 92%  95%  Weight:   128.9 kg   Height:        General: Pt is alert, awake, not in acute distress. Morbidly obese Cardiovascular:  S1/S2 +, no rubs, no gallops Respiratory: diminished breath sounds b/l Abdominal: Soft, NT, obese, bowel sounds + Extremities:  no cyanosis    The results of significant diagnostics from this hospitalization (including imaging, microbiology,  ancillary and laboratory) are listed below for reference.     Microbiology: Recent Results (from the past 240 hour(s))  Respiratory Panel by RT PCR (Flu A&B, Covid) - Nasopharyngeal Swab     Status: None   Collection Time: 10/10/20  1:41 PM   Specimen: Nasopharyngeal Swab  Result Value Ref Range Status   SARS Coronavirus 2 by RT PCR NEGATIVE NEGATIVE Final    Comment: (NOTE) SARS-CoV-2 target nucleic acids are NOT DETECTED.  The SARS-CoV-2 RNA is generally detectable in upper respiratoy specimens during the acute phase of infection. The lowest concentration of SARS-CoV-2 viral copies this assay can detect is 131 copies/mL. A negative result does not preclude SARS-Cov-2 infection and should not be used as the sole basis for treatment or other patient management decisions. A negative result may occur with  improper specimen collection/handling, submission of specimen other than nasopharyngeal swab, presence of viral mutation(s) within the areas targeted by this assay, and inadequate number of viral copies (<131 copies/mL). A negative result must be combined with clinical observations, patient history, and epidemiological information. The expected result is Negative.  Fact Sheet for Patients:  PinkCheek.be  Fact Sheet for Healthcare Providers:  GravelBags.it  This test is no t yet approved or cleared by the [place] FDA and  has been authorized for detection and/or diagnosis of SARS-CoV-2 by FDA under an Emergency Use Authorization (EUA). This EUA will remain  in effect (meaning this test can be used) for the duration of the COVID-19 declaration under Section 564(b)(1) of the Act, 21 U.S.C. section 360bbb-3(b)(1), unless the authorization is terminated or revoked sooner.     Influenza A by PCR NEGATIVE NEGATIVE Final   Influenza B by PCR NEGATIVE NEGATIVE Final    Comment: (NOTE) The Xpert Xpress SARS-CoV-2/FLU/RSV  assay is intended as an aid in  the diagnosis of influenza from Nasopharyngeal swab specimens and  should not be used as a sole basis for treatment. Nasal washings and  aspirates are unacceptable for Xpert Xpress SARS-CoV-2/FLU/RSV  testing.  Fact Sheet for  Patients: PinkCheek.be  Fact Sheet for Healthcare Providers: GravelBags.it  This test is not yet approved or cleared by the [place] FDA and  has been authorized for detection and/or diagnosis of SARS-CoV-2 by  FDA under an Emergency Use Authorization (EUA). This EUA will remain  in effect (meaning this test can be used) for the duration of the  Covid-19 declaration under Section 564(b)(1) of the Act, 21  U.S.C. section 360bbb-3(b)(1), unless the authorization is  terminated or revoked. Performed at [facility], [address] [place], [place] [address]      Labs: BNP (last 3 results) No results for input(s): BNP in the last 8760 hours. Basic Metabolic Panel: Recent Labs  Lab 10/10/20 0832 10/11/20 0438 10/12/20 0729  NA 135 133* 131*  K 4.2 4.3 4.3  CL 99 98 95*  CO2 '22 22 23  ' GLUCOSE 286* 357* 334*  BUN 22* 26* 31*  CREATININE 0.87 1.00 0.93  CALCIUM 8.9 9.6 9.4   Liver Function Tests: Recent Labs  Lab 10/10/20 0832  AST 32  ALT 31  ALKPHOS 72  BILITOT 0.8  PROT 6.8  ALBUMIN 4.2   No results for input(s): LIPASE, AMYLASE in the last 168 hours. No results for input(s): AMMONIA in the last 168 hours. CBC: Recent Labs  Lab 10/10/20 0832 10/11/20 0438 10/12/20 0729  WBC 6.1 11.1* 13.2*  NEUTROABS 3.4  --   --   HGB 14.1 14.7 14.2  HCT 41.5 42.2 41.3  MCV 96.7 94.4 94.7  PLT 244 255 272   Cardiac Enzymes: No results for input(s): CKTOTAL, CKMB, CKMBINDEX, TROPONINI in the last 168 hours. BNP: Invalid input(s): POCBNP CBG: Recent Labs  Lab 10/11/20 0825 10/11/20 1122 10/11/20 1626 10/11/20 2024 10/12/20 0725  GLUCAP  320* 360* 387* 341* 317*   D-Dimer No results for input(s): DDIMER in the last 72 hours. Hgb A1c No results for input(s): HGBA1C in the last 72 hours. Lipid Profile No results for input(s): CHOL, HDL, LDLCALC, TRIG, CHOLHDL, LDLDIRECT in the last 72 hours. Thyroid function studies No results for input(s): TSH, T4TOTAL, T3FREE, THYROIDAB in the last 72 hours.  Invalid input(s): FREET3 Anemia work up No results for input(s): VITAMINB12, FOLATE, FERRITIN, TIBC, IRON, RETICCTPCT in the last 72 hours. Urinalysis    Component Value Date/Time   COLORURINE YELLOW (A) 05/12/2018 0204   APPEARANCEUR CLOUDY (A) 05/12/2018 0204   APPEARANCEUR Clear 12/12/2015 1409   LABSPEC 1.015 05/12/2018 0204   LABSPEC 1.031 04/02/2015 1723   PHURINE 7.0 05/12/2018 0204   GLUCOSEU NEGATIVE 05/12/2018 0204   GLUCOSEU >=500 04/02/2015 1723   HGBUR NEGATIVE 05/12/2018 0204   BILIRUBINUR negative 05/08/2020 1424   BILIRUBINUR negative 09/07/2016 1500   BILIRUBINUR Negative 12/12/2015 1409   BILIRUBINUR Negative 04/02/2015 1723   KETONESUR negative 05/08/2020 1424   KETONESUR NEGATIVE 05/12/2018 0204   PROTEINUR negative 05/08/2020 1424   PROTEINUR NEGATIVE 05/12/2018 0204   UROBILINOGEN 0.2 05/08/2020 1424   NITRITE Negative 05/08/2020 1424   NITRITE NEGATIVE 05/12/2018 0204   LEUKOCYTESUR Trace (A) 05/08/2020 1424   LEUKOCYTESUR Negative 12/12/2015 1409   LEUKOCYTESUR Negative 04/02/2015 1723   Sepsis Labs Invalid input(s): PROCALCITONIN,  WBC,  LACTICIDVEN Microbiology Recent Results (from the past 240 hour(s))  Respiratory Panel by RT PCR (Flu A&B, Covid) - Nasopharyngeal Swab     Status: None   Collection Time: 10/10/20  1:41 PM   Specimen: Nasopharyngeal Swab  Result Value Ref Range Status   SARS Coronavirus 2 by RT  PCR NEGATIVE NEGATIVE Final    Comment: (NOTE) SARS-CoV-2 target nucleic acids are NOT DETECTED.  The SARS-CoV-2 RNA is generally detectable in upper respiratoy specimens  during the acute phase of infection. The lowest concentration of SARS-CoV-2 viral copies this assay can detect is 131 copies/mL. A negative result does not preclude SARS-Cov-2 infection and should not be used as the sole basis for treatment or other patient management decisions. A negative result may occur with  improper specimen collection/handling, submission of specimen other than nasopharyngeal swab, presence of viral mutation(s) within the areas targeted by this assay, and inadequate number of viral copies (<131 copies/mL). A negative result must be combined with clinical observations, patient history, and epidemiological information. The expected result is Negative.  Fact Sheet for Patients:  PinkCheek.be  Fact Sheet for Healthcare Providers:  GravelBags.it  This test is no t yet approved or cleared by the [place] FDA and  has been authorized for detection and/or diagnosis of SARS-CoV-2 by FDA under an Emergency Use Authorization (EUA). This EUA will remain  in effect (meaning this test can be used) for the duration of the COVID-19 declaration under Section 564(b)(1) of the Act, 21 U.S.C. section 360bbb-3(b)(1), unless the authorization is terminated or revoked sooner.     Influenza A by PCR NEGATIVE NEGATIVE Final   Influenza B by PCR NEGATIVE NEGATIVE Final    Comment: (NOTE) The Xpert Xpress SARS-CoV-2/FLU/RSV assay is intended as an aid in  the diagnosis of influenza from Nasopharyngeal swab specimens and  should not be used as a sole basis for treatment. Nasal washings and  aspirates are unacceptable for Xpert Xpress SARS-CoV-2/FLU/RSV  testing.  Fact Sheet for Patients: PinkCheek.be  Fact Sheet for Healthcare Providers: GravelBags.it  This test is not yet approved or cleared by the [place] FDA and  has been authorized for detection and/or  diagnosis of SARS-CoV-2 by  FDA under an Emergency Use Authorization (EUA). This EUA will remain  in effect (meaning this test can be used) for the duration of the  Covid-19 declaration under Section 564(b)(1) of the Act, 21  U.S.C. section 360bbb-3(b)(1), unless the authorization is  terminated or revoked. Performed at [facility], [address] [place], [place] [address]      Time coordinating discharge: Over 30 minutes  SIGNED:   Wyvonnia Dusky, MD  [facility] 10/12/2020, 11:41 AM Pager   If 7PM-7AM, please contact night-coverage

## 2020-10-12 NOTE — Progress Notes (Signed)
This RN provided discharge teaching and instructions to the pt. The pt verbalized and demonstrated understanding of the provided instructions. All outstanding questions resolved. Telemetry monitor removed and returned to Diplomatic Services operational officer. Left arm PIV removed. Cannula intact. Pt tolerated well. All belongings packed and in tow. Transport called to discharge pt via wheelchair to private vehicle.

## 2020-12-04 ENCOUNTER — Other Ambulatory Visit: Payer: Self-pay | Admitting: Cardiovascular Disease

## 2020-12-04 NOTE — Telephone Encounter (Signed)
Please schedule overdue F/U appointment. Thank you! ?

## 2020-12-04 NOTE — Telephone Encounter (Signed)
LVM for patient to call back. ?

## 2020-12-10 NOTE — Telephone Encounter (Signed)
LVM

## 2020-12-13 ENCOUNTER — Encounter: Payer: Self-pay | Admitting: Physician Assistant

## 2020-12-13 ENCOUNTER — Other Ambulatory Visit
Admission: RE | Admit: 2020-12-13 | Discharge: 2020-12-13 | Disposition: A | Discharge status: Home / Self Care | Payer: Managed Care, Other (non HMO) | Source: Ambulatory Visit | Attending: Physician Assistant | Admitting: Physician Assistant

## 2020-12-13 ENCOUNTER — Other Ambulatory Visit: Payer: Self-pay

## 2020-12-13 ENCOUNTER — Ambulatory Visit (INDEPENDENT_AMBULATORY_CARE_PROVIDER_SITE_OTHER): Payer: Managed Care, Other (non HMO) | Admitting: Physician Assistant

## 2020-12-13 VITALS — BP 120/70 | HR 77 | Ht 65.0 in | Wt 293.0 lb

## 2020-12-13 DIAGNOSIS — E782 Mixed hyperlipidemia: Secondary | ICD-10-CM | POA: Diagnosis not present

## 2020-12-13 DIAGNOSIS — G453 Amaurosis fugax: Secondary | ICD-10-CM

## 2020-12-13 DIAGNOSIS — E1151 Type 2 diabetes mellitus with diabetic peripheral angiopathy without gangrene: Secondary | ICD-10-CM

## 2020-12-13 DIAGNOSIS — R0609 Other forms of dyspnea: Secondary | ICD-10-CM

## 2020-12-13 DIAGNOSIS — R42 Dizziness and giddiness: Secondary | ICD-10-CM

## 2020-12-13 DIAGNOSIS — R072 Precordial pain: Secondary | ICD-10-CM

## 2020-12-13 DIAGNOSIS — Z720 Tobacco use: Secondary | ICD-10-CM

## 2020-12-13 DIAGNOSIS — I700 Atherosclerosis of aorta: Secondary | ICD-10-CM

## 2020-12-13 DIAGNOSIS — I10 Essential (primary) hypertension: Secondary | ICD-10-CM

## 2020-12-13 DIAGNOSIS — R0600 Dyspnea, unspecified: Secondary | ICD-10-CM

## 2020-12-13 DIAGNOSIS — R0989 Other specified symptoms and signs involving the circulatory and respiratory systems: Secondary | ICD-10-CM

## 2020-12-13 DIAGNOSIS — Z789 Other specified health status: Secondary | ICD-10-CM

## 2020-12-13 LAB — HEPATIC FUNCTION PANEL
ALT: 28 U/L (ref 0–44)
AST: 25 U/L (ref 15–41)
Albumin: 4.3 g/dL (ref 3.5–5.0)
Alkaline Phosphatase: 67 U/L (ref 38–126)
Bilirubin, Direct: 0.1 mg/dL (ref 0.0–0.2)
Indirect Bilirubin: 0.8 mg/dL (ref 0.3–0.9)
Total Bilirubin: 0.9 mg/dL (ref 0.3–1.2)
Total Protein: 7.2 g/dL (ref 6.5–8.1)

## 2020-12-13 LAB — BASIC METABOLIC PANEL
Anion gap: 11 (ref 5–15)
BUN: 25 mg/dL — ABNORMAL HIGH (ref 6–20)
CO2: 26 mmol/L (ref 22–32)
Calcium: 9.6 mg/dL (ref 8.9–10.3)
Chloride: 105 mmol/L (ref 98–111)
Creatinine, Ser: 0.89 mg/dL (ref 0.44–1.00)
GFR, Estimated: >60 mL/min (ref >60)
Glucose, Bld: 114 mg/dL — ABNORMAL HIGH (ref 70–99)
Potassium: 3.9 mmol/L (ref 3.5–5.1)
Sodium: 142 mmol/L (ref 135–145)

## 2020-12-13 LAB — LIPID PANEL
Cholesterol: 132 mg/dL (ref 0–200)
HDL: 52 mg/dL (ref >40)
LDL Cholesterol: 42 mg/dL (ref 0–99)
Total CHOL/HDL Ratio: 2.5 RATIO
Triglycerides: 190 mg/dL — ABNORMAL HIGH (ref <150)
VLDL: 38 mg/dL (ref 0–40)

## 2020-12-13 MED ORDER — REPATHA SURECLICK 140 MG/ML ~~LOC~~ SOAJ: 140.0000 mg | SUBCUTANEOUS | 3 refills | Status: DC

## 2020-12-13 MED ORDER — METOPROLOL TARTRATE 100 MG PO TABS: 100.0000 mg | ORAL_TABLET | Freq: Once | ORAL | 0 refills | Status: DC

## 2020-12-13 NOTE — Patient Instructions (Addendum)
Medication Instructions:   Your physician recommends that you continue on your current medications as directed. Please refer to the Current Medication list given to you today.  *If you need a refill on your cardiac medications before your next appointment, please call your pharmacy*   Lab Work:  -  Your physician recommends that you have lab work TODAY at the medical mall: Bmet, Lipid / Liver panel -  Please go to the [facility]. You will check in at the front desk to the right as you walk into the atrium.    Testing/Procedures:  1)  Your physician has requested that you have a carotid duplex. This test is an ultrasound of the carotid arteries in your neck. It looks at blood flow through these arteries that supply the brain with blood. Allow one hour for this exam. There are no restrictions or special instructions.    2)  Your cardiac CT will be scheduled at:  [facility] [address] [place],  [address] [phone]   Please arrive at the [facility] main entrance of [facility] 30 minutes prior to test start time. Proceed to the [facility] Radiology Department (first floor) to check-in and test prep.  Please follow these instructions carefully (unless otherwise directed):   On the Night Before the Test: . Be sure to Drink plenty of water. . Do not consume any caffeinated/decaffeinated beverages or chocolate 12 hours prior to your test. . Do not take any antihistamines 12 hours prior to your test.   On the Day of the Test: . Drink plenty of water. Do not drink any water within one hour of the test. . Do not eat any food 4 hours prior to the test. . You may take your regular medications prior to the test.  . Take metoprolol (Lopressor) two hours prior to test. - This has been sent to your pharmacy to pick up. [name] HOLD Hydrochlorothiazide (fluid pill) morning of the test. . FEMALES- please wear underwire-free bra if available  * I  recommend that you contact your PCP to let them know you are having a procedure scheduled that you will not eat 4 hrs prior, and what they advise regarding your insulin and diabetic medications for safest instruction.   * Please make sure that you have someone to drive you to and from procedure due to possible side effects of Metoprolol      After the Test: . Drink plenty of water. . After receiving IV contrast, you may experience a mild flushed feeling. This is normal. . On occasion, you may experience a mild rash up to 24 hours after the test. This is not dangerous. If this occurs, you can take Benadryl 25 mg and increase your fluid intake. . If you experience trouble breathing, this can be serious. If it is severe call 911 IMMEDIATELY. If it is mild, please call our office. . If you take any of these medications: Glipizide/Metformin, Avandament, Glucavance, please do not take 48 hours after completing test unless otherwise instructed.   Once we have confirmed authorization from your insurance company, we will call you to set up a date and time for your test. Based on how quickly your insurance processes prior authorizations requests, please allow up to 4 weeks to be contacted for scheduling your Cardiac CT appointment. Be advised that routine Cardiac CT appointments could be scheduled as many as 8 weeks after your provider has ordered it.  For non-scheduling related questions,  please contact the cardiac imaging nurse navigator should you have any questions/concerns: Marchia Bond, Cardiac Imaging Nurse Navigator Burley Saver, Interim Cardiac Imaging Nurse [place] [facility] Direct Office Dial: [phone]   For scheduling needs, including cancellations and rescheduling, please call [place], [phone].    Follow-Up: At [facility], you and your health needs are our priority.  As part of our continuing mission to provide you with exceptional heart care, we  have created designated Provider Care Teams.  These Care Teams include your primary Cardiologist (physician) and Advanced Practice Providers (APPs -  Physician Assistants and Nurse Practitioners) who all work together to provide you with the care you need, when you need it.  We recommend signing up for the patient portal called "MyChart".  Sign up information is provided on this After Visit Summary.  MyChart is used to connect with patients for Virtual Visits (Telemedicine).  Patients are able to view lab/test results, encounter notes, upcoming appointments, etc.  Non-urgent messages can be sent to your provider as well.   To learn more about what you can do with MyChart, go to [email or web address].    Your next appointment:    Follow up after CT  The format for your next appointment:   In Person  Provider:   You may see Dr. Rockey Situ or one of the following Advanced Practice Providers on your designated Care Team:    Murray Hodgkins, NP  Christell Faith, PA-C  Marrianne Mood, PA-C  Cadence [place], [place]  Laurann Montana, NP

## 2020-12-13 NOTE — Progress Notes (Signed)
Office Visit    Patient Name: Debra [name] Date of Encounter: 12/14/2020  Primary Care Provider:  Martin Majestic, FNP Primary Cardiologist:  Ida Rogue, MD  Chief Complaint    Chief Complaint  Patient presents with  . Other    Past due 12 month follow up. Patient c.o SOB. Meds reviewed verbally with patient.      60 year old female with history of abdominal aortic atherosclerosis, mixed hyperlipidemia, essential hypertension, palpitations, chest pain, DM 2, and here today for 1 year follow-up  Past Medical History    Past Medical History:  Diagnosis Date  . Anxiety   . Arthritis   . C. difficile colitis   . C. difficile colitis   . Depression   . Diabetes mellitus without complication ([place])    Type II  . Diabetes mellitus, type II ([place])   . Family history of adverse reaction to anesthesia    Mother - BP drops  . Head injury, closed, with brief LOC ([place]) 2011ish  . Hyperlipidemia   . Hypertension   . Insomnia   . Myalgia 03/29/2018   Due to statin  . Obesity   . Parkinson's disease ([place])   . Seasonal allergies   . Seizures ([place])   . Tobacco abuse    Past Surgical History:  Procedure Laterality Date  . ABDOMINAL HYSTERECTOMY  2012  . BREAST BIOPSY Right   . CESAREAN SECTION  1992  . CHOLECYSTECTOMY  2013  . LUMBAR LAMINECTOMY/DECOMPRESSION MICRODISCECTOMY Left 09/10/2015   Procedure: Left L4-5 Foraminotomy/Left L5-S1 Diskectomy;  Surgeon: Leeroy Cha, MD;  Location: [facility];  Service: Neurosurgery;  Laterality: Left;  Left L4-5 Foraminotomy/Left L5-S1 Diskectomy    Allergies  Allergies  Allergen Reactions  . Clindamycin/Lincomycin     Pt states caused C-Diff  . Montelukast Hives and Rash    Asthma attacks Asthma attacks  . Primidone     Diffused erythematous and itchy rash  . Sulfasalazine Hives    Other reaction(s): Unknown  . Metformin And Related Diarrhea  . Benadryl [Diphenhydramine Hcl] Swelling    blistering  . Canagliflozin  Other (See Comments)    Other reaction(s): Other (See Comments) Low bp Low bp Low bp  . Depakote Er [Divalproex Sodium Er] Other (See Comments)    seizures  . Dilaudid [Hydromorphone Hcl] Other (See Comments)    seizures  . Diphenhydramine-Zinc Acetate Other (See Comments)  . Erythromycin   . Ethanol   . Haloperidol Other (See Comments)  . Hydromorphone Other (See Comments)  . Neosporin [Neomycin-Bacitracin Zn-Polymyx] Swelling    blistering  . Singulair [Montelukast Sodium] Other (See Comments)    Asthma attacks  . Sitagliptin Other (See Comments)  . Statins Other (See Comments)    Other reaction(s): Unknown  . Sulfa Antibiotics Hives    Other reaction(s): Unknown  . Victoza [Liraglutide] Other (See Comments) and Nausea And Vomiting    Other reaction(s): Other (See Comments) pancreatitis pancreatitis    History of Present Illness    Debra [name] is a 60 y.o. female with PMH as above.  She has history of tobacco use, aortic atherosclerosis on CT, poorly controlled DM2, obesity, hypertension, Parkinson's disease with tremor, and atypical chest pain/heaviness.  She was initially referred to [facility] heart care based on symptoms of fluttering and chest heaviness after she started increased activities.  Of note, she has history of multiple medication intolerances and also reports significant family history of CAD today that influences her level of  anxiety.  She reportedly takes care of her mother, who she reports has heart failure.  She reports her anxiety increases and decreases based on the amount of fluid her mother is holding onto, as well as whether or not she can breathe well.  She was seen via virtual visit 04/13/2019 by her primary cardiologist, Dr. Rockey Situ, for palpitations.  She was also referred for chest heaviness that started after increased Actos.  She reported mild symptoms when increased from 30 mg to 40 mg.  After her dose was decreased back to 30 mg daily and she was started on  Farxiga, she denied any further episodes of chest fluttering or heaviness.  Via virtual visit, she reported she was active at baseline.  No chest pain or shortness of breath on exertion.  She did report that she liked sweets, especially diet.  Her sugars were decreasing into the 70s, occasionally even after dinner.  Her diet and blood sugar control was discussed.  She reported poor tolerance on statins.  On Zetia, she became dizzy and reported myalgias.  CT study from 02/2018 was reviewed with at least mild and in some cases mild to moderate calcification noted in the abdominal aorta and branches of iliac vessels.  Recommendation was to start PCSK9 inhibitors, given her poor tolerance of Zetia and statins.  LDL control was recommended given her seen abdominal aortic atherosclerosis and poorly controlled DM2 with history of smoking.  Primary care was contacted by her primary cardiologist for management of her glucose.  No changes were made to her antihypertensive regimen.  She was continued on propanolol for tremor and palpitations.  Given no symptoms consistent with angina, further ischemic work-up was deferred.  Today, 12/13/2020, she returns to clinic and notes she is doing well overall from a cardiac standpoint.  She notes significant anxiety concerning her cardiac health, especially as she takes care of her mother.  She reports new dyspnea on exertion that she states was not present at her previous telemedicine visit.  Shortness of breath occurs with walking longer distances and goes away when resting.  She states this has been progressive and occurring over the last 6 months.  Shortness of breath does not occur at rest.  She does admit to continued tobacco use, smoking 2 packs/week.  She reports that she has significantly cut back on her tobacco use when compared with previous visits.  No reported alcohol or drug use.  She reports chest pain when stressed.  Usually, this chest pain lasts approximately 10  minutes.  Chest pain is further described as sharp and without radiation.  She usually notes relief sooner if able to close her eyes and calm herself down.  She reports CPAP compliance with known history of sleep apnea.  When monitoring her blood pressure at home, she reports her BP is usually SBP 120s and DBP 80s.  A couple of weeks ago, she reported significant fatigue despite getting sleep.  This significantly concerned her.  She does acknowledge at least some element of deconditioning, stating her only real activity is taking care of her mother and cleaning.  She denies any tachypalpitations but does note some dizziness, attributed to her tremors.  She was informed by neurology that her benign essential tremors are the reason for her dizziness, given her head shakes when she closes her eyes.  She denies any dizziness if she is not closing her eyes.  Due to this dizziness, she has a shower seat, because if she were to close her  eyes during her shower, she would fall over and hurt herself.  She does report amaurosis fugax.  She states this recently started 8 months ago with friends telling her it was due to her blood pressure, but she has noticed it at all times without any clear triggers.  She reports staying away from salt, though she does occasionally get some salt such as her recent salty olives for lunch.  She denies any signs or symptoms of bleeding.  She reports medication compliance with her current medications.  Home Medications    Current Outpatient Medications on File Prior to Visit  Medication Sig Dispense Refill  . aspirin EC 81 MG tablet Take 81 mg by mouth daily.    . blood glucose meter kit and supplies Use up to four times daily as directed, Dx E11.65, LON 99 months 1 each 0  . calcium carbonate (OSCAL) 1500 (600 Ca) MG TABS tablet Take 1 tablet by mouth daily with breakfast.     . Cholecalciferol 5000 units capsule Take 5,000 Units by mouth.     . clonazePAM (KLONOPIN) 0.5 MG tablet  Take 1 tablet (0.5 mg total) by mouth as directed. Take one tablet once a day as needed up to 2-3 times a week only for severe anxiety attacks 12 tablet 0  . clotrimazole-betamethasone (LOTRISONE) cream Apply 1 application topically 2 (two) times daily. Sparingly to affected skin for up to 7 d at a time 30 g 1  . Continuous Blood Gluc Receiver (FREESTYLE LIBRE 14 DAY READER) DEVI 1 Device.    . Continuous Blood Gluc Sensor (FREESTYLE LIBRE 14 DAY SENSOR) MISC 1 kit.    . famotidine (PEPCID) 20 MG tablet Take 1 tablet (20 mg total) by mouth daily. 30 tablet 0  . FARXIGA 10 MG TABS tablet TAKE 1 TABLET DAILY (HIGHER DOSE) 90 tablet 3  . folic acid (FOLVITE) 431 MCG tablet Take 800 mcg by mouth daily.     [name] glucose blood (ONE TOUCH ULTRA TEST) test strip USE UP TO FOUR TIMES A DAY AS DIRECTED; Dx 11.51, LON 99 months 300 each 3  . HUMALOG KWIKPEN 100 UNIT/ML KwikPen INJECT 20 UNITS UNDER THE SKIN IN THE MORNING AND 35 UNITS WITH LUNCH AND EVENING MEAL (INJECT WITH MEALS) 90 mL 3  . hydrochlorothiazide (HYDRODIURIL) 25 MG tablet TAKE 1 TABLET DAILY (DOSE INCREASE, REPLACES ALL OTHER HCTZ PRESCRIPTIONS) 90 tablet 3  . hydrocortisone cream 1 % Apply topically 4 (four) times daily as needed for itching. 30 g 0  . Insulin Pen Needle 29G X 12.7MM MISC 1 each by Does not apply route as needed. 100 each 3  . lamoTRIgine (LAMICTAL) 100 MG tablet TAKE ONE AND ONE-HALF TABLETS DAILY (DOSE INCREASE) 135 tablet 3  . lisinopril (ZESTRIL) 40 MG tablet TAKE 1 TABLET DAILY 90 tablet 0  . Melatonin 10 MG TABS Take 1 tablet by mouth at bedtime.    . Multiple Vitamin (MULTI-VITAMIN) tablet Take 1 tablet by mouth daily.     . Multiple Vitamins-Minerals (CENTRUM WOMEN) TABS Take 1 tablet by mouth.    [name] PARoxetine (PAXIL) 40 MG tablet TAKE 1 TABLET EVERY MORNING 90 tablet 1  . pioglitazone (ACTOS) 30 MG tablet TAKE 1 TABLET DAILY 90 tablet 0  . propranolol (INNOPRAN XL) 80 MG 24 hr capsule TAKE 1 CAPSULE DAILY    .  TRESIBA FLEXTOUCH 200 UNIT/ML SOPN Inject 120 Units into the skin at bedtime. 36 pen 5   No current facility-administered medications on  file prior to visit.    Review of Systems    She deniespalpitations, pnd, orthopnea, n, v, syncope, or early satiety.  She reports chest pain associated with stress and alleviated by relaxation.  She reports dyspnea with walking longer distances and alleviated by rest, which is new and progressive over the last 6 months.  She notes dizziness, attributed to her benign essential tremor, and reportedly explained by neurology as due to her tremor when closing her eyes.  She denies dizziness if not closing her eyes.  She reports recent onset amaurosis fugax 8 months ago without clear triggers.  She denies any syncope or loss of consciousness.  No recent falls.  She does use a shower seat to prevent falls. No tachypalpitations.  She reports fatigue.    She reports chronic edema.  She reports deconditioning and a relatively sedentary lifestyle.  Wt gain noted when comparing her current weight with other recorded weights, though previous visit noted to be virtual.  She reports BP controlled at home.   All other systems reviewed and are otherwise negative except as noted above.  Physical Exam    VS:  BP 120/70 (BP Location: Left Arm, Patient Position: Sitting, Cuff Size: Large)   Pulse 77   Ht '5\' 5"'  (1.651 m)   Wt 293 lb (132.9 kg)   SpO2 96%   BMI 48.76 kg/m  , BMI Body mass index is 48.76 kg/m. GEN: Well nourished, obese female, in no acute distress. HEENT: normal. Neck: Supple, JVD difficult to assess due to body habitus.  Right-sided carotid bruit, no left-sided bruit.  No masses. Cardiac: RRR, no murmurs, rubs, or gallops. No clubbing, cyanosis, moderate to 1+ bilateral lower extremity edema.  Radials/DP/PT 2+ and equal bilaterally.  Respiratory:  Respirations regular and unlabored, RLL faint crackles otherwise CTAB.  Normal respiratory effort. GI: Soft,  nontender, nondistended, BS + x 4. MS: no deformity or atrophy. Skin: warm and dry, no rash. Neuro:  Strength and sensation are intact. Psych: Normal affect.  Accessory Clinical Findings    ECG personally reviewed by me today -NSR, 77 bpm, poor R wave progression in leads III and aVF likely due to lead placement, poor R wave progression in precordial leads as seen on prior EKGs- no acute changes.  VITALS Reviewed today   Temp Readings from Last 3 Encounters:  10/12/20 98.5 F (36.9 C) (Oral)  05/08/20 99.2 F (37.3 C) (Oral)  02/20/20 98.8 F (37.1 C) (Oral)   BP Readings from Last 3 Encounters:  12/13/20 120/70  10/12/20 (!) 162/69  05/08/20 (!) 161/78   Pulse Readings from Last 3 Encounters:  12/13/20 77  10/12/20 (!) 54  05/08/20 66    Wt Readings from Last 3 Encounters:  12/13/20 293 lb (132.9 kg)  10/12/20 284 lb 3.2 oz (128.9 kg)  05/08/20 293 lb (132.9 kg)     LABS  reviewed today   Lab Results  Component Value Date   WBC 13.2 (H) 10/12/2020   HGB 14.2 10/12/2020   HCT 41.3 10/12/2020   MCV 94.7 10/12/2020   PLT 272 10/12/2020   Lab Results  Component Value Date   CREATININE 0.89 12/13/2020   BUN 25 (H) 12/13/2020   NA 142 12/13/2020   K 3.9 12/13/2020   CL 105 12/13/2020   CO2 26 12/13/2020   Lab Results  Component Value Date   ALT 28 12/13/2020   AST 25 12/13/2020   ALKPHOS 67 12/13/2020   BILITOT 0.9 12/13/2020  Lab Results  Component Value Date   CHOL 132 12/13/2020   HDL 52 12/13/2020   LDLCALC 42 12/13/2020   TRIG 190 (H) 12/13/2020   CHOLHDL 2.5 12/13/2020    Lab Results  Component Value Date   HGBA1C 8.3 (H) 09/21/2019   Lab Results  Component Value Date   TSH 1.671 02/16/2019     STUDIES/PROCEDURES reviewed today   Pending Reviewed previous EKGs  Assessment & Plan    Dyspnea on exertion -- Reports progressive dyspnea on exertion over the last 6 months.  Dyspnea occurs with walking longer distances and alleviates  with rest.  In addition, she has noted weight gain and lower extremity edema.  Weight gain attributed by patient to caloric intake and sedentary lifestyle.  Also noted is ongoing smoking, which could contribute to her dyspnea.  Reports compliance with HCTZ 25 mg daily and continued urine output.  Suspect dyspnea at least in part attributed to deconditioning. Will obtain BMET with further recommendations regarding increased diuretic at that time, given dose appear volume up today. Given her medication intolerances, I am hesitant to transition from HCTZ to lasix for increased diuresis and recommend against using both HCTZ and lasix 2/2 increased chance of electrolyte imbalance. If labs show low K and stable Cr, and if BP allows, consider instead addition of spironolactone to allow for additional diuresis. Obtain cardiac CT as below to rule out dyspnea as anginal equivalent.  Encouraged increased activity and heart healthy diet.  Smoking cessation advised.  Recommended monitoring daily weights and BP.  Reviewed signs and symptoms of volume overload.  Reassess at RTC.  Atypical chest pain / Precordial pain -- Reports chest pain that occurs with stress and alleviates with removal of stressor.  Chest pain is atypical, given it is mainly triggered by emotion and does not occur with exertion.  Also reports DOE, which could be her anginal equivalent. Risk factors for cardiac etiology include current smoker, hypertension, DM 2, hyperlipidemia, nonspecific EKG changes, known aortic atherosclerosis, and family history.  Given her sx and risk factors, plan for cardiac CT. Further recommendations pending those results.  Aggressive risk factor modification recommended, including control of LDL, glucose, heart rate, and blood pressure.  Continue current ASA 81 mg daily and PCSK9 inhibitors. Future considerations could include Imdur with ongoing atypical CP. Will defer for now.   Abdominal aortic atherosclerosis -- Previous  images showed aortic atherosclerosis.  Continue to recommend LDL and glucose control, as well as smoking cessation.  Heart rate and BP control also recommended.  Continue current PCSK9 inhibitors and ASA 81 mg daily.  HLD, LDL goal less than 70 -- Recommend LDL goal of less than 70 given aortic atherosclerosis and DM2.  Continue current PCSK9 inhibitors.  Recheck lipids, LFTs.  Essential hypertension  -- BP relatively well controlled today.  Continue lisinopril 40 mg daily for GDMT given comorbid DM2. No medication changes to HCTZ pending labs as outlined above. If potassium low and renal function allows, may add spironolactone as above to assist with volume status as BP allows.    Palpitations, resolved -- No Palpitations, resolvedfurther report of palpitations since decreasing Actos and current propranolol.  Continue current propanolol for palpitations and tremor.  No further work-up at this time.  DM2 -- Strict glycemic control recommended for risk factor modification.  Continue lisinopril, given comorbid hypertension.  Dizziness/amaurosis fugax -- Reports history of dizziness that only occurs when closing her eyes.  In addition, she now reports new amaurosis fugax x  8 months.  She initially attributed this to her blood pressure, but it seems to occur at all times.  Right-sided bruit appreciated on exam.  Obtain right carotid ultrasound.  OSA on CPAP --Reports CPAP compliance. Ongoing CPAP use encouraged.   Medication changes: ? Arlyce Harman. Changes pending labs Labs ordered: BMET,Lipids, LFTs Studies / Imaging ordered: R carotid, cardiac CT Future considerations: TBD Disposition: RTC after studies    Arvil Chaco, PA-C

## 2020-12-18 ENCOUNTER — Telehealth: Payer: Self-pay | Admitting: *Deleted

## 2020-12-18 DIAGNOSIS — Z79899 Other long term (current) drug therapy: Secondary | ICD-10-CM

## 2020-12-18 DIAGNOSIS — I10 Essential (primary) hypertension: Secondary | ICD-10-CM

## 2020-12-18 MED ORDER — SPIRONOLACTONE 25 MG PO TABS: 12.5000 mg | ORAL_TABLET | Freq: Every day | ORAL | 5 refills | Status: DC

## 2020-12-18 NOTE — Telephone Encounter (Signed)
-----   Message from Alver Sorrow, NP sent at 12/18/2020  1:58 PM EST ----- Labs show normal liver function, stable kidney function, normal electrolytes. Lipid panel with LDL at goal of <70. Triglycerides mildly elevated at 190, goal <150. Recommend avoiding sweets, sugars, carbohydrates to help reduce triglycerides. As her renal function was normal and slight volume overload in clinic, recommend addition of Spironolactone 12.5mg  daily. Repeat BMP on 01/01/21 (she has carotid duplex that day).

## 2020-12-18 NOTE — Telephone Encounter (Signed)
Spoke to pt, notified of lab results and recc below. Pt verbalized understanding.  She will begin Spironolactone 12.5mg  daily and will repeat Bmet at the medical mall prior to her carotid duplex appt on 01/01/21. Rx sent to [facility] [address] and lab orders placed.

## 2020-12-26 NOTE — Telephone Encounter (Signed)
Pt was seen 1/7

## 2020-12-27 ENCOUNTER — Telehealth: Payer: Self-pay | Admitting: Cardiovascular Disease

## 2020-12-27 MED ORDER — LISINOPRIL 40 MG PO TABS
20.0000 mg | ORAL_TABLET | Freq: Every day | ORAL | 3 refills | Status: DC
Start: 2020-12-27 — End: 2021-11-11

## 2020-12-27 NOTE — Telephone Encounter (Signed)
Spoke with the patient to clarify her hypotension.  She reported BP 99/54 without associated dizziness and HR 87 to 88 bpm.  Since starting her spironolactone, she has noted significant improvement in her lower extremity edema and symptoms.  She has lost 4 pounds on this medication.    Recommendations --Continue current spironolactone 12.5 mg daily. --Decrease to lisinopril 20 mg daily.  Patient expressed her appreciation for the call.

## 2020-12-27 NOTE — Telephone Encounter (Signed)
Was able to consult Nobie Putnam, PA-C regarding pt's concern of low BP after starting spironolactone 12.5 mg daily, pt was worried that she may need her BP medications adjusted. Dossie Arbour PA-C will call pt to discuss. Verbalized reducing her lisinopril in 1/2 to 20 mg daily. This will be updated in her med list from her 40 mg.

## 2020-12-27 NOTE — Telephone Encounter (Signed)
Pt c/o medication issue:  1. Name of Medication: spironolactone   2. How are you currently taking this medication (dosage and times per day)? .5 tablet once a day in morning  3. Are you having a reaction (difficulty breathing--STAT)?   4. What is your medication issue? Recently started medication and states she has been having low BP, 99/54. Patient states she is taking multiple medications that may need to be changed in regards to this medication

## 2021-01-01 ENCOUNTER — Ambulatory Visit (INDEPENDENT_AMBULATORY_CARE_PROVIDER_SITE_OTHER): Payer: Managed Care, Other (non HMO)

## 2021-01-01 ENCOUNTER — Telehealth (INDEPENDENT_AMBULATORY_CARE_PROVIDER_SITE_OTHER): Payer: 59 | Admitting: Psychiatry

## 2021-01-01 ENCOUNTER — Encounter: Payer: Self-pay | Admitting: Psychiatry

## 2021-01-01 ENCOUNTER — Other Ambulatory Visit: Payer: Self-pay

## 2021-01-01 ENCOUNTER — Other Ambulatory Visit
Admission: RE | Admit: 2021-01-01 | Discharge: 2021-01-01 | Disposition: A | Discharge status: Home / Self Care | Payer: Managed Care, Other (non HMO) | Attending: Family | Admitting: Family

## 2021-01-01 DIAGNOSIS — Z79899 Other long term (current) drug therapy: Secondary | ICD-10-CM | POA: Diagnosis present

## 2021-01-01 DIAGNOSIS — G453 Amaurosis fugax: Secondary | ICD-10-CM | POA: Diagnosis not present

## 2021-01-01 DIAGNOSIS — F17200 Nicotine dependence, unspecified, uncomplicated: Secondary | ICD-10-CM

## 2021-01-01 DIAGNOSIS — F4310 Post-traumatic stress disorder, unspecified: Secondary | ICD-10-CM

## 2021-01-01 DIAGNOSIS — F3178 Bipolar disorder, in full remission, most recent episode mixed: Secondary | ICD-10-CM | POA: Diagnosis not present

## 2021-01-01 DIAGNOSIS — I10 Essential (primary) hypertension: Secondary | ICD-10-CM

## 2021-01-01 DIAGNOSIS — R0989 Other specified symptoms and signs involving the circulatory and respiratory systems: Secondary | ICD-10-CM | POA: Diagnosis not present

## 2021-01-01 LAB — BASIC METABOLIC PANEL
Anion gap: 12 (ref 5–15)
BUN: 23 mg/dL — ABNORMAL HIGH (ref 6–20)
CO2: 26 mmol/L (ref 22–32)
Calcium: 9.6 mg/dL (ref 8.9–10.3)
Chloride: 102 mmol/L (ref 98–111)
Creatinine, Ser: 1.06 mg/dL — ABNORMAL HIGH (ref 0.44–1.00)
GFR, Estimated: >60 mL/min (ref >60)
Glucose, Bld: 163 mg/dL — ABNORMAL HIGH (ref 70–99)
Potassium: 4 mmol/L (ref 3.5–5.1)
Sodium: 140 mmol/L (ref 135–145)

## 2021-01-01 NOTE — Progress Notes (Signed)
Virtual Visit via Video Note  I connected with Debra [name] on 01/01/21 at 10:00 AM EST by a video enabled telemedicine application and verified that I am speaking with the correct person using two identifiers.  Location Provider Location : [facility] Patient Location : Home  Participants: Patient , Provider   I discussed the limitations of evaluation and management by telemedicine and the availability of in person appointments. The patient expressed understanding and agreed to proceed.     I discussed the assessment and treatment plan with the patient. The patient was provided an opportunity to ask questions and all were answered. The patient agreed with the plan and demonstrated an understanding of the instructions.   The patient was advised to call back or seek an in-person evaluation if the symptoms worsen or if the condition fails to improve as anticipated.   [place] MD OP Progress Note  01/01/2021 10:54 AM Debra [name]  MRN:  [id number]  Chief Complaint:  Chief Complaint    Follow-up     HPI: Debra [name] is a 60 year old Caucasian female, married, disabled, lives in [place], has a history of bipolar disorder, PTSD, tobacco use disorder, parkinsonian tremors, chronic back pain, OSA, hypertension, possible REM sleep behavior disorder was evaluated by telemedicine today.  Patient today reports she had a good holiday season.  She was able to spend time with family.  She however reports she was admitted to the hospital in November for anaphylactic reaction to a new medication that she was started on.  She however reports that she got through it and currently feels good.  She denies any depression or anxiety symptoms.  Patient reports sleep is currently good since she can't sleep through the night.  She does not have to get up to drop her husband off at work anymore since he has started driving again.  She is compliant on medications.  She denies side effects.  Patient denies any  suicidality, homicidality or perceptual disturbances.  Patient denies any other concerns today.  Visit Diagnosis:    ICD-10-CM   1. Bipolar disorder, in full remission, most recent episode mixed ([place])  F31.78   2. PTSD (post-traumatic stress disorder)  F43.10   3. Tobacco use disorder  F17.200     Past Psychiatric History: I have reviewed past psychiatric history from my progress note on 02/16/2019.  Past Medical History:  Past Medical History:  Diagnosis Date  . Anxiety   . Arthritis   . C. difficile colitis   . C. difficile colitis   . Depression   . Diabetes mellitus without complication ([place])    Type II  . Diabetes mellitus, type II ([place])   . Family history of adverse reaction to anesthesia    Mother - BP drops  . Head injury, closed, with brief LOC ([place]) 2011ish  . Hyperlipidemia   . Hypertension   . Insomnia   . Myalgia 03/29/2018   Due to statin  . Obesity   . Parkinson's disease ([place])   . Seasonal allergies   . Seizures ([place])   . Tobacco abuse     Past Surgical History:  Procedure Laterality Date  . ABDOMINAL HYSTERECTOMY  2012  . BREAST BIOPSY Right   . CESAREAN SECTION  1992  . CHOLECYSTECTOMY  2013  . LUMBAR LAMINECTOMY/DECOMPRESSION MICRODISCECTOMY Left 09/10/2015   Procedure: Left L4-5 Foraminotomy/Left L5-S1 Diskectomy;  Surgeon: Leeroy Cha, MD;  Location: [facility];  Service: Neurosurgery;  Laterality: Left;  Left L4-5 Foraminotomy/Left  L5-S1 Diskectomy    Family Psychiatric History: I have reviewed family psychiatric history from my progress note on 02/16/2019.  Family History:  Family History  Problem Relation Age of Onset  . COPD Mother   . Bipolar disorder Father   . Diabetes Mellitus II Father   . Bipolar disorder Brother   . Diabetes Mellitus II Brother   . Bipolar disorder Brother     Social History: Reviewed social history from my progress note on 02/16/2019. Social History   Socioeconomic History  . Marital status: Married     Spouse name: [name]  . Number of children: 1  . Years of education: Not on file  . Highest education level: Associate degree: occupational, Hotel manager, or vocational program  Occupational History  . Occupation: Disabled  Tobacco Use  . Smoking status: Current Every Day Smoker    Years: 24.00    Types: Cigarettes    Start date: 03/19/2004  . Smokeless tobacco: Never Used  . Tobacco comment: 2-3 cig per day since last month - reported 01/01/2021  Vaping Use  . Vaping Use: Never used  Substance and Sexual Activity  . Alcohol use: Yes    Alcohol/week: 0.0 standard drinks    Comment: occasional- rare  . Drug use: No  . Sexual activity: Not Currently  Other Topics Concern  . Not on file  Social History Narrative   Live in private residence w/ husband and their friend who is wheelchair bound, her end of the house is wheelchair accessible.    Social Determinants of Health   Financial Resource Strain: Not on file  Food Insecurity: Not on file  Transportation Needs: Not on file  Physical Activity: Not on file  Stress: Not on file  Social Connections: Not on file    Allergies:  Allergies  Allergen Reactions  . Clindamycin/Lincomycin     Pt states caused C-Diff  . Montelukast Hives and Rash    Asthma attacks Asthma attacks  . Primidone     Diffused erythematous and itchy rash  . Sulfasalazine Hives    Other reaction(s): Unknown  . Metformin And Related Diarrhea  . Benadryl [Diphenhydramine Hcl] Swelling    blistering  . Canagliflozin Other (See Comments)    Other reaction(s): Other (See Comments) Low bp Low bp Low bp  . Depakote Er [Divalproex Sodium Er] Other (See Comments)    seizures  . Dilaudid [Hydromorphone Hcl] Other (See Comments)    seizures  . Diphenhydramine-Zinc Acetate Other (See Comments)  . Erythromycin   . Ethanol   . Haloperidol Other (See Comments)  . Hydromorphone Other (See Comments)  . Neosporin [Neomycin-Bacitracin Zn-Polymyx] Swelling     blistering  . Singulair [Montelukast Sodium] Other (See Comments)    Asthma attacks  . Sitagliptin Other (See Comments)  . Statins Other (See Comments)    Other reaction(s): Unknown  . Sulfa Antibiotics Hives    Other reaction(s): Unknown  . Victoza [Liraglutide] Other (See Comments) and Nausea And Vomiting    Other reaction(s): Other (See Comments) pancreatitis pancreatitis    Metabolic Disorder Labs: Lab Results  Component Value Date   HGBA1C 8.3 (H) 09/21/2019   MPG 192 09/21/2019   MPG 214 06/16/2019   No results found for: PROLACTIN Lab Results  Component Value Date   CHOL 132 12/13/2020   TRIG 190 (H) 12/13/2020   HDL 52 12/13/2020   CHOLHDL 2.5 12/13/2020   VLDL 38 12/13/2020   LDLCALC 42 12/13/2020   LDLCALC 24 09/21/2019  Lab Results  Component Value Date   TSH 1.671 02/16/2019   TSH 2.160 08/20/2015    Therapeutic Level Labs: No results found for: LITHIUM No results found for: VALPROATE No components found for:  CBMZ  Current Medications: Current Outpatient Medications  Medication Sig Dispense Refill  . aspirin EC 81 MG tablet Take 81 mg by mouth daily.    . blood glucose meter kit and supplies Use up to four times daily as directed, Dx E11.65, LON 99 months 1 each 0  . calcium carbonate (OSCAL) 1500 (600 Ca) MG TABS tablet Take 1 tablet by mouth daily with breakfast.     . Cholecalciferol 5000 units capsule Take 5,000 Units by mouth.     . clonazePAM (KLONOPIN) 0.5 MG tablet Take 1 tablet (0.5 mg total) by mouth as directed. Take one tablet once a day as needed up to 2-3 times a week only for severe anxiety attacks 12 tablet 0  . clotrimazole-betamethasone (LOTRISONE) cream Apply 1 application topically 2 (two) times daily. Sparingly to affected skin for up to 7 d at a time 30 g 1  . Continuous Blood Gluc Receiver (FREESTYLE LIBRE 14 DAY READER) DEVI 1 Device.    . Continuous Blood Gluc Sensor (FREESTYLE LIBRE 14 DAY SENSOR) MISC 1 kit.    .  Evolocumab (REPATHA SURECLICK) 580 MG/ML SOAJ Inject 140 mg into the skin every 14 (fourteen) days. 6 mL 3  . famotidine (PEPCID) 20 MG tablet Take 1 tablet (20 mg total) by mouth daily. 30 tablet 0  . FARXIGA 10 MG TABS tablet TAKE 1 TABLET DAILY (HIGHER DOSE) 90 tablet 3  . folic acid (FOLVITE) 998 MCG tablet Take 800 mcg by mouth daily.     [name] glucose blood (ONE TOUCH ULTRA TEST) test strip USE UP TO FOUR TIMES A DAY AS DIRECTED; Dx 11.51, LON 99 months 300 each 3  . HUMALOG KWIKPEN 100 UNIT/ML KwikPen INJECT 20 UNITS UNDER THE SKIN IN THE MORNING AND 35 UNITS WITH LUNCH AND EVENING MEAL (INJECT WITH MEALS) 90 mL 3  . hydrochlorothiazide (HYDRODIURIL) 25 MG tablet TAKE 1 TABLET DAILY (DOSE INCREASE, REPLACES ALL OTHER HCTZ PRESCRIPTIONS) 90 tablet 3  . hydrocortisone cream 1 % Apply topically 4 (four) times daily as needed for itching. 30 g 0  . Insulin Pen Needle 29G X 12.7MM MISC 1 each by Does not apply route as needed. 100 each 3  . lamoTRIgine (LAMICTAL) 100 MG tablet TAKE ONE AND ONE-HALF TABLETS DAILY (DOSE INCREASE) 135 tablet 3  . lisinopril (ZESTRIL) 40 MG tablet Take 0.5 tablets (20 mg total) by mouth daily. 45 tablet 3  . Melatonin 10 MG TABS Take 1 tablet by mouth at bedtime.    . metoprolol tartrate (LOPRESSOR) 100 MG tablet Take 1 tablet (100 mg total) by mouth once for 1 dose. Take TWO hours prior to CT procedure 1 tablet 0  . Multiple Vitamin (MULTI-VITAMIN) tablet Take 1 tablet by mouth daily.     . Multiple Vitamins-Minerals (CENTRUM WOMEN) TABS Take 1 tablet by mouth.    [name] PARoxetine (PAXIL) 40 MG tablet TAKE 1 TABLET EVERY MORNING 90 tablet 1  . pioglitazone (ACTOS) 30 MG tablet TAKE 1 TABLET DAILY 90 tablet 0  . propranolol (INNOPRAN XL) 80 MG 24 hr capsule TAKE 1 CAPSULE DAILY    . spironolactone (ALDACTONE) 25 MG tablet Take 0.5 tablets (12.5 mg total) by mouth daily. 15 tablet 5  . TRESIBA FLEXTOUCH 200 UNIT/ML SOPN Inject 120 Units  into the skin at bedtime. 36 pen 5    No current facility-administered medications for this visit.     Musculoskeletal: Strength & Muscle Tone: UTA Gait & Station: normal Patient leans: N/A  Psychiatric Specialty Exam: Review of Systems  Psychiatric/Behavioral: Negative for agitation, behavioral problems, confusion, decreased concentration, dysphoric mood, hallucinations, self-injury, sleep disturbance and suicidal ideas. The patient is not nervous/anxious and is not hyperactive.   All other systems reviewed and are negative.   There were no vitals taken for this visit.There is no height or weight on file to calculate BMI.  General Appearance: Casual  Eye Contact:  Fair  Speech:  Clear and Coherent  Volume:  Normal  Mood:  Euthymic  Affect:  Congruent  Thought Process:  Goal Directed and Descriptions of Associations: Intact  Orientation:  Full (Time, Place, and Person)  Thought Content: Logical   Suicidal Thoughts:  No  Homicidal Thoughts:  No  Memory:  Immediate;   Fair Recent;   Fair Remote;   Fair  Judgement:  Fair  Insight:  Fair  Psychomotor Activity:  Normal  Concentration:  Concentration: Fair and Attention Span: Fair  Recall:  [employer] of Knowledge: Fair  Language: Fair  Akathisia:  No  Handed:  Right  AIMS (if indicated): UTA  Assets:  Communication Skills Desire for Improvement Housing Social Support  ADL's:  Intact  Cognition: WNL  Sleep:  Fair   Screenings: Mini-Mental   [place] [facility] Visit from 09/21/2016 in [facility]  Total Score (max 30 points ) 27    PHQ2-9   [place] Visit from 02/16/2020 in [facility] Video Visit from 12/06/2019 in [facility] Office Visit from 09/21/2019 in [facility] Office Visit from 09/07/2019 in [facility] Office Visit from 06/13/2019 in [facility]  PHQ-2 Total Score 5 0 0 0 0  PHQ-9 Total Score 11 0 0 0 0        Assessment and Plan: Debra [name] is a 60 year old Caucasian female, married, disabled, lives in [place], has a history of bipolar disorder, PTSD, hypertension, diabetes melitis, OSA on CPAP, chronic pain was evaluated by telemedicine today.  Patient is biologically predisposed given her history of trauma, family history of mental health problems.  Patient with psychosocial stressors of the pandemic, mother's health issues, her own health problems, financial problems.  Patient is currently stable on current medication regimen.  Plan as noted below.  Plan Bipolar disorder in remission Lamictal 150 mg p.o. daily Paxil 40 mg daily Klonopin 0.5 mg as needed 2-3 times a week for anxiety attacks.  She has been limiting use.  PTSD-stable Paxil as prescribed   Insomnia-stable Continue melatonin 10 mg p.o. nightly CPAP for OSA  Tobacco use disorder-improving Patient is currently cutting back.  Provided counseling.  Follow-up in clinic in 3 months or sooner if needed.  I have spent atleast 20 minutes face to face by video with patient today. More than 50 % of the time was spent for preparing to see the patient ( e.g., review of test, records ), ordering medications and test ,psychoeducation and supportive psychotherapy and care coordination,as well as documenting clinical information in electronic health record. This note was generated in part or whole with voice recognition software. Voice recognition is usually quite accurate but there are transcription errors that can and very often do occur. I apologize for any typographical errors that were not detected  and corrected.     Ursula Alert, MD 01/01/2021, 10:54 AM

## 2021-01-05 ENCOUNTER — Other Ambulatory Visit: Payer: Self-pay | Admitting: Cardiovascular Disease

## 2021-01-07 ENCOUNTER — Telehealth (HOSPITAL_COMMUNITY): Payer: Self-pay | Admitting: *Deleted

## 2021-01-07 NOTE — Telephone Encounter (Signed)
Reaching out to patient to offer assistance regarding upcoming cardiac imaging study; pt verbalizes understanding of appt date/time, parking situation and where to check in, pre-test NPO status and medications ordered, and verified current allergies; name and call back number provided for further questions should they arise  Patrizia Paule RN Navigator Cardiac Imaging [facility] [phone] office [phone] cell  

## 2021-01-09 ENCOUNTER — Ambulatory Visit
Admission: RE | Admit: 2021-01-09 | Discharge: 2021-01-09 | Disposition: A | Discharge status: Home / Self Care | Payer: Managed Care, Other (non HMO) | Source: Ambulatory Visit | Attending: Physician Assistant | Admitting: Physician Assistant

## 2021-01-09 ENCOUNTER — Other Ambulatory Visit: Payer: Self-pay

## 2021-01-09 DIAGNOSIS — R0600 Dyspnea, unspecified: Secondary | ICD-10-CM | POA: Diagnosis present

## 2021-01-09 DIAGNOSIS — R072 Precordial pain: Secondary | ICD-10-CM | POA: Diagnosis present

## 2021-01-09 DIAGNOSIS — R0609 Other forms of dyspnea: Secondary | ICD-10-CM

## 2021-01-09 MED ORDER — NITROGLYCERIN 0.4 MG SL SUBL
0.8000 mg | SUBLINGUAL_TABLET | Freq: Once | SUBLINGUAL | Status: AC
  Administered 2021-01-09: 0.8 mg via SUBLINGUAL

## 2021-01-09 MED ORDER — IOHEXOL 350 MG/ML SOLN
125.0000 mL | Freq: Once | INTRAVENOUS | Status: AC | PRN
  Administered 2021-01-09: 125 mL via INTRAVENOUS

## 2021-01-09 NOTE — Progress Notes (Signed)
Patient tolerated procedure well. Ambulate w/o difficulty. Sitting in chair drinking water provided. Encouraged to drink extra water today and reasoning explained. Verbalized understanding. All questions answered. ABC intact. No further needs. Discharge from procedure area w/o issues.  

## 2021-01-13 ENCOUNTER — Telehealth: Payer: Self-pay | Admitting: *Deleted

## 2021-01-13 NOTE — Telephone Encounter (Signed)
Attempted to call pt regarding CT results. No answer. Lmtcb.   Pt's appt has already been moved to 2/9 with Dr. Mariah Milling per recc.  Pt not aware of appt change. Will notify on return call. Will try to reach pt again today.

## 2021-01-13 NOTE — Telephone Encounter (Signed)
-----   Message from Lennon Alstrom, PA-C sent at 01/10/2021  5:59 PM EST ----- Discussed results with Dr. Mariah Milling 01/10/21   Will schedule to see Dr. Mariah Milling 01/15/21 to reassess sx and discuss her CT / recommendation for alternative workup.  Cardiac CT showed  --CAC score of 171, 95th percentile for age and sex max control.   --Calcified plaque noted in the m LAD.   --All 3 major coronary arteries could not be analyzed properly due to poor image quality 2/2 body habitus.  Dr. Mariah Milling and [facility] as [place]ain Childes.

## 2021-01-13 NOTE — Telephone Encounter (Signed)
Patient calling to discuss recent testing results  ° °Please call  ° °

## 2021-01-13 NOTE — Telephone Encounter (Signed)
Spoke to pt, notified of CT results and recc to schedule follow up this week.  Pt verbalized understanding. She confirmed appt for 2/9 at 1120.  Pt has no further questions at this time.

## 2021-01-15 ENCOUNTER — Other Ambulatory Visit: Payer: Self-pay

## 2021-01-15 ENCOUNTER — Encounter: Payer: Self-pay | Admitting: Cardiovascular Disease

## 2021-01-15 ENCOUNTER — Ambulatory Visit (INDEPENDENT_AMBULATORY_CARE_PROVIDER_SITE_OTHER): Payer: Managed Care, Other (non HMO) | Admitting: Cardiovascular Disease

## 2021-01-15 VITALS — BP 124/80 | HR 66 | Ht 65.5 in | Wt 297.0 lb

## 2021-01-15 DIAGNOSIS — E782 Mixed hyperlipidemia: Secondary | ICD-10-CM | POA: Diagnosis not present

## 2021-01-15 DIAGNOSIS — T466X5A Adverse effect of antihyperlipidemic and antiarteriosclerotic drugs, initial encounter: Secondary | ICD-10-CM

## 2021-01-15 DIAGNOSIS — I10 Essential (primary) hypertension: Secondary | ICD-10-CM | POA: Diagnosis not present

## 2021-01-15 DIAGNOSIS — I700 Atherosclerosis of aorta: Secondary | ICD-10-CM | POA: Diagnosis not present

## 2021-01-15 DIAGNOSIS — E1151 Type 2 diabetes mellitus with diabetic peripheral angiopathy without gangrene: Secondary | ICD-10-CM | POA: Diagnosis not present

## 2021-01-15 DIAGNOSIS — M791 Myalgia, unspecified site: Secondary | ICD-10-CM

## 2021-01-15 DIAGNOSIS — Z720 Tobacco use: Secondary | ICD-10-CM

## 2021-01-15 DIAGNOSIS — R0609 Other forms of dyspnea: Secondary | ICD-10-CM

## 2021-01-15 DIAGNOSIS — R0600 Dyspnea, unspecified: Secondary | ICD-10-CM

## 2021-01-15 DIAGNOSIS — R072 Precordial pain: Secondary | ICD-10-CM

## 2021-01-15 NOTE — Progress Notes (Signed)
Date:  01/15/2021   ID:  Debra [name], DOB 1961/03/31, MRN [id number]  Patient Location:  [place] [id number]   Provider location:   [facility], [place] office  PCP:  Martin Majestic, FNP  Cardiologist:  [facility]    Chief Complaint  Patient presents with  . Other    Follow up post CT - patient thinks she may of had some anxiety after getting her CT results and felt chest pressure. Meds reviewed verbally with patient.     History of Present Illness:    Debra [name] is a 60 y.o. female  past medical history of Smoker 10-15 Aortic athero on CT Poorly controlled diabetes Morbid obesity Hypertension Parkinson's with tremor Who presents for follow-up of her uncontrolled diabetes, atherosclerosis, chest heaviness  Last seen in clinic by myself, telemetry visit May 2020 Seen by one of our providers January 2022  At that time had some shortness of breath, She was still smoking 2 packs/day Compliant with her CPAP no problem  Cardiac CT ordered on January 2022 visit Calcium score 171 Image quality limited secondary to morbid obesity Nonobstructive disease noted Fatty liver noted  In follow-up today reports she is only smoking 5 cigarettes a day, Try to watch her diet, Denies significant leg swelling Still with mild shortness of breath on exertion  Total cholesterol 132, LDL 42 No recent hemoglobin A1c Reports sugars are trending downward  Tolerating PCSK9 inhibitor Statin intolerance Dramatic improvement in lipids   CT scan from March 2019 At least mild, some case mild to moderate calcification noted in the abdominal aorta and branches/iliac vessels    Past Medical History:  Diagnosis Date  . Anxiety   . Arthritis   . C. difficile colitis   . C. difficile colitis   . Depression   . Diabetes mellitus without complication ([place])    Type II  . Diabetes mellitus, type II ([place])   . Family history of  adverse reaction to anesthesia    Mother - BP drops  . Head injury, closed, with brief LOC ([place]) 2011ish  . Hyperlipidemia   . Hypertension   . Insomnia   . Myalgia 03/29/2018   Due to statin  . Obesity   . Parkinson's disease ([place])   . Seasonal allergies   . Seizures ([place])   . Tobacco abuse    Past Surgical History:  Procedure Laterality Date  . ABDOMINAL HYSTERECTOMY  2012  . BREAST BIOPSY Right   . CESAREAN SECTION  1992  . CHOLECYSTECTOMY  2013  . LUMBAR LAMINECTOMY/DECOMPRESSION MICRODISCECTOMY Left 09/10/2015   Procedure: Left L4-5 Foraminotomy/Left L5-S1 Diskectomy;  Surgeon: Leeroy Cha, MD;  Location: [facility];  Service: Neurosurgery;  Laterality: Left;  Left L4-5 Foraminotomy/Left L5-S1 Diskectomy     Current Meds  Medication Sig  . aspirin EC 81 MG tablet Take 81 mg by mouth daily.  . blood glucose meter kit and supplies Use up to four times daily as directed, Dx E11.65, LON 99 months  . calcium carbonate (OSCAL) 1500 (600 Ca) MG TABS tablet Take 1 tablet by mouth daily with breakfast.   . Cholecalciferol 5000 units capsule Take 5,000 Units by mouth.   . clonazePAM (KLONOPIN) 0.5 MG tablet Take 1 tablet (0.5 mg total) by mouth as directed. Take one tablet once a day as needed up to 2-3 times a week only for severe anxiety attacks  . clotrimazole-betamethasone (LOTRISONE)  cream Apply 1 application topically 2 (two) times daily. Sparingly to affected skin for up to 7 d at a time  . Continuous Blood Gluc Receiver (FREESTYLE LIBRE 14 DAY READER) DEVI 1 Device.  . Continuous Blood Gluc Sensor (FREESTYLE LIBRE 14 DAY SENSOR) MISC 1 kit.  . famotidine (PEPCID) 20 MG tablet Take 1 tablet (20 mg total) by mouth daily.  [name] FARXIGA 10 MG TABS tablet TAKE 1 TABLET DAILY (HIGHER DOSE)  . folic acid (FOLVITE) 628 MCG tablet Take 800 mcg by mouth daily.   [name] glucose blood (ONE TOUCH ULTRA TEST) test strip USE UP TO FOUR TIMES A DAY AS DIRECTED; Dx 11.51, LON 99 months  . HUMALOG  KWIKPEN 100 UNIT/ML KwikPen INJECT 20 UNITS UNDER THE SKIN IN THE MORNING AND 35 UNITS WITH LUNCH AND EVENING MEAL (INJECT WITH MEALS)  . hydrochlorothiazide (HYDRODIURIL) 25 MG tablet TAKE 1 TABLET DAILY (DOSE INCREASE, REPLACES ALL OTHER HCTZ PRESCRIPTIONS)  . hydrocortisone cream 1 % Apply topically 4 (four) times daily as needed for itching.  . Insulin Pen Needle 29G X 12.7MM MISC 1 each by Does not apply route as needed.  . lamoTRIgine (LAMICTAL) 100 MG tablet TAKE ONE AND ONE-HALF TABLETS DAILY (DOSE INCREASE)  . lisinopril (ZESTRIL) 40 MG tablet Take 0.5 tablets (20 mg total) by mouth daily.  . Melatonin 10 MG TABS Take 1 tablet by mouth at bedtime.  . Multiple Vitamin (MULTI-VITAMIN) tablet Take 1 tablet by mouth daily.   . Multiple Vitamins-Minerals (CENTRUM WOMEN) TABS Take 1 tablet by mouth.  [name] PARoxetine (PAXIL) 40 MG tablet TAKE 1 TABLET EVERY MORNING  . pioglitazone (ACTOS) 30 MG tablet TAKE 1 TABLET DAILY  . propranolol (INNOPRAN XL) 80 MG 24 hr capsule TAKE 1 CAPSULE DAILY  . REPATHA SURECLICK 366 MG/ML SOAJ INJECT 140 MG INTO THE SKIN EVERY 14 (FOURTEEN) DAYS.  [name] spironolactone (ALDACTONE) 25 MG tablet Take 0.5 tablets (12.5 mg total) by mouth daily.  . TRESIBA FLEXTOUCH 200 UNIT/ML SOPN Inject 120 Units into the skin at bedtime.     Allergies:   Clindamycin/lincomycin, Montelukast, Primidone, Sulfasalazine, Metformin and related, Benadryl [diphenhydramine hcl], Canagliflozin, Depakote er [divalproex sodium er], Dilaudid [hydromorphone hcl], Diphenhydramine-zinc acetate, Erythromycin, Ethanol, Haloperidol, Hydromorphone, Neosporin [neomycin-bacitracin zn-polymyx], Singulair [montelukast sodium], Sitagliptin, Statins, Sulfa antibiotics, and Victoza [liraglutide]   Social History   Tobacco Use  . Smoking status: Current Every Day Smoker    Years: 24.00    Types: Cigarettes    Start date: 03/19/2004  . Smokeless tobacco: Never Used  . Tobacco comment: 2-3 cig per day since  last month - reported 01/01/2021  Vaping Use  . Vaping Use: Never used  Substance Use Topics  . Alcohol use: Yes    Alcohol/week: 0.0 standard drinks    Comment: occasional- rare  . Drug use: No     Current Outpatient Medications on File Prior to Visit  Medication Sig Dispense Refill  . aspirin EC 81 MG tablet Take 81 mg by mouth daily.    . blood glucose meter kit and supplies Use up to four times daily as directed, Dx E11.65, LON 99 months 1 each 0  . calcium carbonate (OSCAL) 1500 (600 Ca) MG TABS tablet Take 1 tablet by mouth daily with breakfast.     . Cholecalciferol 5000 units capsule Take 5,000 Units by mouth.     . clonazePAM (KLONOPIN) 0.5 MG tablet Take 1 tablet (0.5 mg total) by mouth as directed. Take one tablet once a day  as needed up to 2-3 times a week only for severe anxiety attacks 12 tablet 0  . clotrimazole-betamethasone (LOTRISONE) cream Apply 1 application topically 2 (two) times daily. Sparingly to affected skin for up to 7 d at a time 30 g 1  . Continuous Blood Gluc Receiver (FREESTYLE LIBRE 14 DAY READER) DEVI 1 Device.    . Continuous Blood Gluc Sensor (FREESTYLE LIBRE 14 DAY SENSOR) MISC 1 kit.    . famotidine (PEPCID) 20 MG tablet Take 1 tablet (20 mg total) by mouth daily. 30 tablet 0  . FARXIGA 10 MG TABS tablet TAKE 1 TABLET DAILY (HIGHER DOSE) 90 tablet 3  . folic acid (FOLVITE) 174 MCG tablet Take 800 mcg by mouth daily.     [name] glucose blood (ONE TOUCH ULTRA TEST) test strip USE UP TO FOUR TIMES A DAY AS DIRECTED; Dx 11.51, LON 99 months 300 each 3  . HUMALOG KWIKPEN 100 UNIT/ML KwikPen INJECT 20 UNITS UNDER THE SKIN IN THE MORNING AND 35 UNITS WITH LUNCH AND EVENING MEAL (INJECT WITH MEALS) 90 mL 3  . hydrochlorothiazide (HYDRODIURIL) 25 MG tablet TAKE 1 TABLET DAILY (DOSE INCREASE, REPLACES ALL OTHER HCTZ PRESCRIPTIONS) 90 tablet 3  . hydrocortisone cream 1 % Apply topically 4 (four) times daily as needed for itching. 30 g 0  . Insulin Pen Needle 29G X  12.7MM MISC 1 each by Does not apply route as needed. 100 each 3  . lamoTRIgine (LAMICTAL) 100 MG tablet TAKE ONE AND ONE-HALF TABLETS DAILY (DOSE INCREASE) 135 tablet 3  . lisinopril (ZESTRIL) 40 MG tablet Take 0.5 tablets (20 mg total) by mouth daily. 45 tablet 3  . Melatonin 10 MG TABS Take 1 tablet by mouth at bedtime.    . Multiple Vitamin (MULTI-VITAMIN) tablet Take 1 tablet by mouth daily.     . Multiple Vitamins-Minerals (CENTRUM WOMEN) TABS Take 1 tablet by mouth.    [name] PARoxetine (PAXIL) 40 MG tablet TAKE 1 TABLET EVERY MORNING 90 tablet 1  . pioglitazone (ACTOS) 30 MG tablet TAKE 1 TABLET DAILY 90 tablet 0  . propranolol (INNOPRAN XL) 80 MG 24 hr capsule TAKE 1 CAPSULE DAILY    . REPATHA SURECLICK 944 MG/ML SOAJ INJECT 140 MG INTO THE SKIN EVERY 14 (FOURTEEN) DAYS. 2 mL 1  . spironolactone (ALDACTONE) 25 MG tablet Take 0.5 tablets (12.5 mg total) by mouth daily. 15 tablet 5  . TRESIBA FLEXTOUCH 200 UNIT/ML SOPN Inject 120 Units into the skin at bedtime. 36 pen 5   No current facility-administered medications on file prior to visit.     Family Hx: The patient's family history includes Bipolar disorder in her brother, brother, and father; COPD in her mother; Diabetes Mellitus II in her brother and father.  ROS:   Please see the history of present illness.    Review of Systems  Constitutional: Negative.   Respiratory: Negative.   Cardiovascular: Positive for palpitations.  Gastrointestinal: Negative.   Musculoskeletal: Negative.   Neurological: Negative.   Psychiatric/Behavioral: Negative.   All other systems reviewed and are negative.    Labs/Other Tests and Data Reviewed:    Recent Labs: 10/12/2020: Hemoglobin 14.2; Platelets 272 12/13/2020: ALT 28 01/01/2021: BUN 23; Creatinine, Ser 1.06; Potassium 4.0; Sodium 140   Recent Lipid Panel Lab Results  Component Value Date/Time   CHOL 132 12/13/2020 05:16 PM   CHOL 228 (H) 06/14/2015 08:18 AM   TRIG 190 (H) 12/13/2020  05:16 PM   HDL 52 12/13/2020 05:16 PM  HDL 44 06/14/2015 08:18 AM   CHOLHDL 2.5 12/13/2020 05:16 PM   LDLCALC 42 12/13/2020 05:16 PM   LDLCALC 24 09/21/2019 12:00 AM    Wt Readings from Last 3 Encounters:  01/15/21 297 lb (134.7 kg)  12/13/20 293 lb (132.9 kg)  10/12/20 284 lb 3.2 oz (128.9 kg)     Exam:    Vital Signs: Vital signs may also be detailed in the HPI BP 124/80 (BP Location: Left Arm, Patient Position: Sitting, Cuff Size: Large)   Pulse 66   Ht 5' 5.5" (1.664 m)   Wt 297 lb (134.7 kg)   SpO2 98%   BMI 48.67 kg/m   Constitutional:  oriented to person, place, and time. No distress.  HENT:  Head: Grossly normal Eyes:  no discharge. No scleral icterus.  Neck: No JVD, no carotid bruits  Cardiovascular: Regular rate and rhythm, no murmurs appreciated Pulmonary/Chest: Clear to auscultation bilaterally, no wheezes or rails Abdominal: Soft.  no distension.  no tenderness.  Musculoskeletal: Normal range of motion Neurological:  normal muscle tone. Coordination normal. No atrophy Skin: Skin warm and dry Psychiatric: normal affect, pleasant  ASSESSMENT & PLAN:    Type 2 diabetes with complications We have encouraged continued exercise, careful diet management in an effort to lose weight.  Abdominal aortic atherosclerosis (HCC) On CT scan, mild, possibly mild to moderate aortic atherosclerosis and distal aorta and iliac vessels Cholesterol at goal Smoking cessation recommended Discussed diabetes control, weight loss  Coronary calcification Shortness of breath unrelated to coronary disease Cholesterol at goal Discussed cardiac CTA in detail  Mixed hyperlipidemia Cholesterol is at goal on the current lipid regimen. No changes to the medications were made.  Essential hypertension Blood pressure is well controlled on today's visit. No changes made to the medications.  Tremor On propranolol She has discussed that she might need to increase the dosing If  propranolol is increased may need to cut HCTZ in half, and hold spironolactone given low blood pressure  Chest pain/shortness of breath Atypical in nature Cardiac CTA discussed as above   Total encounter time more than 25 minutes  Greater than 50% was spent in counseling and coordination of care with the patient   Signed, Ida Rogue, MD  01/15/2021 11:55 AM    [place] [facility] [address], [place], [place] [address]

## 2021-01-15 NOTE — Patient Instructions (Signed)
Medication Instructions:  If they increase your propranolol, Cut the HCTZ in 1/2 daily and stop the spironolactone    If you need a refill on your cardiac medications before your next appointment, please call your pharmacy.    Lab work: No new labs needed   If you have labs (blood work) drawn today and your tests are completely normal, you will receive your results only by: [name] MyChart Message (if you have MyChart) OR . A paper copy in the mail If you have any lab test that is abnormal or we need to change your treatment, we will call you to review the results.   Testing/Procedures: No new testing needed   Follow-Up: At [facility], you and your health needs are our priority.  As part of our continuing mission to provide you with exceptional heart care, we have created designated Provider Care Teams.  These Care Teams include your primary Cardiologist (physician) and Advanced Practice Providers (APPs -  Physician Assistants and Nurse Practitioners) who all work together to provide you with the care you need, when you need it.  . You will need a follow up appointment in 12 months  . Providers on your designated Care Team:   . Nicolasa Ducking, NP . Eula Listen, PA-C . Marisue Ivan, PA-C  Any Other Special Instructions Will Be Listed Below (If Applicable).  COVID-19 Vaccine Information can be found at: [email or web address] For questions related to vaccine distribution or appointments, please email [email or web address] or call [phone].

## 2021-01-27 ENCOUNTER — Ambulatory Visit: Payer: Managed Care, Other (non HMO) | Admitting: Cardiovascular Disease

## 2021-02-07 ENCOUNTER — Other Ambulatory Visit: Payer: Self-pay | Admitting: Cardiovascular Disease

## 2021-02-07 ENCOUNTER — Other Ambulatory Visit: Payer: Self-pay | Admitting: Family Medicine

## 2021-02-07 DIAGNOSIS — Z1231 Encounter for screening mammogram for malignant neoplasm of breast: Secondary | ICD-10-CM

## 2021-02-07 MED ORDER — REPATHA SURECLICK 140 MG/ML ~~LOC~~ SOAJ: 140.0000 mg | SUBCUTANEOUS | 3 refills | Status: DC

## 2021-02-07 NOTE — Telephone Encounter (Signed)
Patient returning call re: repatha

## 2021-02-07 NOTE — Telephone Encounter (Signed)
*  STAT* If patient is at the pharmacy, call can be transferred to refill team.   1. Which medications need to be refilled? (please list name of each medication and dose if known) repatha   2. Which pharmacy/location (including street and city if local pharmacy) is medication to be sent to? [facility] [address]  3. Do they need a 30 day or 90 day supply? 3 month  Patient would like to get 3 months due to losing insurance at the end of the month. Please advise patient if we are able to assist or her options

## 2021-02-07 NOTE — Telephone Encounter (Signed)
Left a detailed message on the patient's answering machine to contact our office regarding the refill on Repatha.

## 2021-03-04 ENCOUNTER — Other Ambulatory Visit: Payer: Self-pay | Admitting: Family

## 2021-03-07 ENCOUNTER — Encounter: Payer: Self-pay | Admitting: Psychiatry

## 2021-03-07 ENCOUNTER — Telehealth (INDEPENDENT_AMBULATORY_CARE_PROVIDER_SITE_OTHER): Payer: 59 | Admitting: Psychiatry

## 2021-03-07 ENCOUNTER — Other Ambulatory Visit: Payer: Self-pay

## 2021-03-07 DIAGNOSIS — F4310 Post-traumatic stress disorder, unspecified: Secondary | ICD-10-CM

## 2021-03-07 DIAGNOSIS — F3178 Bipolar disorder, in full remission, most recent episode mixed: Secondary | ICD-10-CM

## 2021-03-07 DIAGNOSIS — F17200 Nicotine dependence, unspecified, uncomplicated: Secondary | ICD-10-CM

## 2021-03-07 NOTE — Progress Notes (Signed)
Virtual Visit via Telephone Note  I connected with Debra [name] on 03/07/21 at 10:20 AM EDT by telephone and verified that I am speaking with the correct person using two identifiers. Location Provider Location : [facility] Patient Location : Home  Participants: Patient , Provider   I discussed the limitations, risks, security and privacy concerns of performing an evaluation and management service by telephone and the availability of in person appointments. I also discussed with the patient that there may be a patient responsible charge related to this service. The patient expressed understanding and agreed to proceed.    I discussed the assessment and treatment plan with the patient. The patient was provided an opportunity to ask questions and all were answered. The patient agreed with the plan and demonstrated an understanding of the instructions.   The patient was advised to call back or seek an in-person evaluation if the symptoms worsen or if the condition fails to improve as anticipated.    [place] MD OP Progress Note  03/07/2021 11:54 AM Debra [name]  MRN:  [id number]  Chief Complaint:  Chief Complaint    Anxiety; Follow-up     HPI: KENNIYA WESTRICH is a 60 year old Caucasian female, married, disabled, lives in [place], has a history of bipolar disorder, PTSD, tobacco use disorder, parkinsonian tremors, chronic back pain, OSA, hypertension, possible REM sleep behavior disorder was evaluated by telemedicine today.  Patient today reports she is currently having psychosocial stressors of not having enough help at home, having to take care of her mother who struggles with health problems and who currently is in pulmonary rehab.  Patient reports however her husband is going to retire soon and she hopes he will be able to find time to help her.  Patient reports however she is coping okay.  She is cutting back on her smoking.  She had her cardiology visit which went well.  Patient is compliant on  medications.  Denies side effects.  Patient denies any suicidality, homicidality or perceptual disturbances.  Patient denies any other concerns today.    Visit Diagnosis:    ICD-10-CM   1. Bipolar disorder, in full remission, most recent episode mixed ([place])  F31.78   2. PTSD (post-traumatic stress disorder)  F43.10   3. Tobacco use disorder  F17.200     Past Psychiatric History: I have reviewed past psychiatric history from my progress note on 02/16/2019  Past Medical History:  Past Medical History:  Diagnosis Date  . Anxiety   . Arthritis   . C. difficile colitis   . C. difficile colitis   . Depression   . Diabetes mellitus without complication ([place])    Type II  . Diabetes mellitus, type II ([place])   . Family history of adverse reaction to anesthesia    Mother - BP drops  . Head injury, closed, with brief LOC ([place]) 2011ish  . Hyperlipidemia   . Hypertension   . Insomnia   . Myalgia 03/29/2018   Due to statin  . Obesity   . Parkinson's disease ([place])   . Seasonal allergies   . Seizures ([place])   . Tobacco abuse     Past Surgical History:  Procedure Laterality Date  . ABDOMINAL HYSTERECTOMY  2012  . BREAST BIOPSY Right   . CESAREAN SECTION  1992  . CHOLECYSTECTOMY  2013  . LUMBAR LAMINECTOMY/DECOMPRESSION MICRODISCECTOMY Left 09/10/2015   Procedure: Left L4-5 Foraminotomy/Left L5-S1 Diskectomy;  Surgeon: Leeroy Cha, MD;  Location: [facility];  Service:  Neurosurgery;  Laterality: Left;  Left L4-5 Foraminotomy/Left L5-S1 Diskectomy    Family Psychiatric History: I have reviewed family psychiatric history from my progress note on 02/16/2019  Family History:  Family History  Problem Relation Age of Onset  . COPD Mother   . Bipolar disorder Father   . Diabetes Mellitus II Father   . Bipolar disorder Brother   . Diabetes Mellitus II Brother   . Bipolar disorder Brother     Social History: Reviewed social history from my progress note on 02/16/2019 Social History    Socioeconomic History  . Marital status: Married    Spouse name: [name]  . Number of children: 1  . Years of education: Not on file  . Highest education level: Associate degree: occupational, Hotel manager, or vocational program  Occupational History  . Occupation: Disabled  Tobacco Use  . Smoking status: Current Every Day Smoker    Years: 24.00    Types: Cigarettes    Start date: 03/19/2004  . Smokeless tobacco: Never Used  . Tobacco comment: 2-3 cig per day since last month - reported 01/01/2021  Vaping Use  . Vaping Use: Never used  Substance and Sexual Activity  . Alcohol use: Yes    Alcohol/week: 0.0 standard drinks    Comment: occasional- rare  . Drug use: No  . Sexual activity: Not Currently  Other Topics Concern  . Not on file  Social History Narrative   Live in private residence w/ husband and their friend who is wheelchair bound, her end of the house is wheelchair accessible.    Social Determinants of Health   Financial Resource Strain: Not on file  Food Insecurity: Not on file  Transportation Needs: Not on file  Physical Activity: Not on file  Stress: Not on file  Social Connections: Not on file    Allergies:  Allergies  Allergen Reactions  . Clindamycin/Lincomycin     Pt states caused C-Diff  . Montelukast Hives and Rash    Asthma attacks Asthma attacks  . Primidone     Diffused erythematous and itchy rash  . Sulfasalazine Hives    Other reaction(s): Unknown  . Metformin And Related Diarrhea  . Benadryl [Diphenhydramine Hcl] Swelling    blistering  . Canagliflozin Other (See Comments)    Other reaction(s): Other (See Comments) Low bp Low bp Low bp  . Depakote Er [Divalproex Sodium Er] Other (See Comments)    seizures  . Dilaudid [Hydromorphone Hcl] Other (See Comments)    seizures  . Diphenhydramine-Zinc Acetate Other (See Comments)  . Erythromycin   . Ethanol   . Haloperidol Other (See Comments)  . Hydromorphone Other (See Comments)  .  Neosporin [Neomycin-Bacitracin Zn-Polymyx] Swelling    blistering  . Singulair [Montelukast Sodium] Other (See Comments)    Asthma attacks  . Sitagliptin Other (See Comments)  . Statins Other (See Comments)    Other reaction(s): Unknown  . Sulfa Antibiotics Hives    Other reaction(s): Unknown  . Victoza [Liraglutide] Other (See Comments) and Nausea And Vomiting    Other reaction(s): Other (See Comments) pancreatitis pancreatitis    Metabolic Disorder Labs: Lab Results  Component Value Date   HGBA1C 8.3 (H) 09/21/2019   MPG 192 09/21/2019   MPG 214 06/16/2019   No results found for: PROLACTIN Lab Results  Component Value Date   CHOL 132 12/13/2020   TRIG 190 (H) 12/13/2020   HDL 52 12/13/2020   CHOLHDL 2.5 12/13/2020   VLDL 38 12/13/2020   [place]  42 12/13/2020   LDLCALC 24 09/21/2019   Lab Results  Component Value Date   TSH 1.671 02/16/2019   TSH 2.160 08/20/2015    Therapeutic Level Labs: No results found for: LITHIUM No results found for: VALPROATE No components found for:  CBMZ  Current Medications: Current Outpatient Medications  Medication Sig Dispense Refill  . aspirin EC 81 MG tablet Take 81 mg by mouth daily.    . blood glucose meter kit and supplies Use up to four times daily as directed, Dx E11.65, LON 99 months 1 each 0  . calcium carbonate (OSCAL) 1500 (600 Ca) MG TABS tablet Take 1 tablet by mouth daily with breakfast.     . Cholecalciferol 5000 units capsule Take 5,000 Units by mouth.     . clonazePAM (KLONOPIN) 0.5 MG tablet Take 1 tablet (0.5 mg total) by mouth as directed. Take one tablet once a day as needed up to 2-3 times a week only for severe anxiety attacks 12 tablet 0  . clotrimazole-betamethasone (LOTRISONE) cream Apply 1 application topically 2 (two) times daily. Sparingly to affected skin for up to 7 d at a time 30 g 1  . Continuous Blood Gluc Receiver (FREESTYLE LIBRE 14 DAY READER) DEVI 1 Device.    . Continuous Blood Gluc Sensor  (FREESTYLE LIBRE 14 DAY SENSOR) MISC 1 kit.    . Evolocumab (REPATHA SURECLICK) 660 MG/ML SOAJ Inject 140 mg into the skin every 14 (fourteen) days. 6 mL 3  . famotidine (PEPCID) 20 MG tablet Take 1 tablet (20 mg total) by mouth daily. 30 tablet 0  . FARXIGA 10 MG TABS tablet TAKE 1 TABLET DAILY (HIGHER DOSE) 90 tablet 3  . fluconazole (DIFLUCAN) 150 MG tablet PLEASE SEE ATTACHED FOR DETAILED DIRECTIONS    . folic acid (FOLVITE) 630 MCG tablet Take 800 mcg by mouth daily.     [name] glucose blood (ONE TOUCH ULTRA TEST) test strip USE UP TO FOUR TIMES A DAY AS DIRECTED; Dx 11.51, LON 99 months 300 each 3  . HUMALOG KWIKPEN 100 UNIT/ML KwikPen INJECT 20 UNITS UNDER THE SKIN IN THE MORNING AND 35 UNITS WITH LUNCH AND EVENING MEAL (INJECT WITH MEALS) 90 mL 3  . hydrochlorothiazide (HYDRODIURIL) 25 MG tablet TAKE 1 TABLET DAILY (DOSE INCREASE, REPLACES ALL OTHER HCTZ PRESCRIPTIONS) 90 tablet 3  . hydrocortisone cream 1 % Apply topically 4 (four) times daily as needed for itching. 30 g 0  . Insulin Pen Needle 29G X 12.7MM MISC 1 each by Does not apply route as needed. 100 each 3  . lamoTRIgine (LAMICTAL) 100 MG tablet TAKE ONE AND ONE-HALF TABLETS DAILY (DOSE INCREASE) 135 tablet 3  . lisinopril (ZESTRIL) 40 MG tablet Take 0.5 tablets (20 mg total) by mouth daily. 45 tablet 3  . Melatonin 10 MG TABS Take 1 tablet by mouth at bedtime.    . Multiple Vitamin (MULTI-VITAMIN) tablet Take 1 tablet by mouth daily.     . Multiple Vitamins-Minerals (CENTRUM WOMEN) TABS Take 1 tablet by mouth.    [name] PARoxetine (PAXIL) 40 MG tablet TAKE 1 TABLET EVERY MORNING 90 tablet 1  . pioglitazone (ACTOS) 30 MG tablet TAKE 1 TABLET DAILY 90 tablet 0  . propranolol (INNOPRAN XL) 80 MG 24 hr capsule TAKE 1 CAPSULE DAILY    . propranolol ER (INDERAL LA) 80 MG 24 hr capsule Take 80 mg by mouth daily.    [name] spironolactone (ALDACTONE) 25 MG tablet Take 0.5 tablets (12.5 mg total) by mouth daily.  15 tablet 5  . TRESIBA FLEXTOUCH 200  UNIT/ML SOPN Inject 120 Units into the skin at bedtime. 36 pen 5   No current facility-administered medications for this visit.     Musculoskeletal: Strength & Muscle Tone: UTA Gait & Station: UTA Patient leans: N/A  Psychiatric Specialty Exam: Review of Systems  Psychiatric/Behavioral: The patient is nervous/anxious.   All other systems reviewed and are negative.   There were no vitals taken for this visit.There is no height or weight on file to calculate BMI.  General Appearance: UTA  Eye Contact:  UTA  Speech:  Normal Rate  Volume:  Normal  Mood:  Anxious  Affect:  UTA  Thought Process:  Goal Directed and Descriptions of Associations: Intact  Orientation:  Full (Time, Place, and Person)  Thought Content: Logical   Suicidal Thoughts:  No  Homicidal Thoughts:  No  Memory:  Immediate;   Fair Recent;   Fair Remote;   Fair  Judgement:  Fair  Insight:  Fair  Psychomotor Activity:  UTA  Concentration:  Concentration: Fair and Attention Span: Fair  Recall:  [employer] of Knowledge: Fair  Language: Fair  Akathisia:  No  Handed:  Right  AIMS (if indicated): UTA  Assets:  Communication Skills Desire for Improvement Housing Social Support  ADL's:  Intact  Cognition: WNL  Sleep:  Fair   Screenings: Mini-Mental   [place] Office Visit from 09/21/2016 in [facility]  Total Score (max 30 points ) 27    PHQ2-9   [place] Visit from 02/16/2020 in [facility] Video Visit from 12/06/2019 in [facility] Office Visit from 09/21/2019 in [facility] Office Visit from 09/07/2019 in [facility] Office Visit from 06/13/2019 in [facility]  PHQ-2 Total Score 5 0 0 0 0  PHQ-9 Total Score 11 0 0 0 0       Assessment and Plan: ANURADHA CHABOT is a 60 year old Caucasian female, married, disabled, lives in [place], has a history of bipolar disorder, PTSD,  hypertension, diabetes melitis, OSA on CPAP, chronic pain was evaluated by telemedicine today.  Patient is biologically predisposed given history of trauma, family history of mental health problems.  Patient with psychosocial stressors of pandemic, mother's health issues, her own health problems, financial problems.  Patient is currently stable on current medication regimen.  Plan as noted below.  Plan Bipolar disorder in remission Lamictal 150 mg p.o. daily Paxil 40 mg p.o. daily Klonopin 0.5 mg as needed 2-3 times a week only for severe anxiety attacks.  She has been limiting use  PTSD-stable Paxil 40 mg p.o. daily  Insomnia-stable Continue melatonin 10 mg manage nightly CPAP for OSA  Tobacco use disorder-improving Provided counseling  Follows up with Dr. Toma Deiters for cardiac care-essential hypertension, coronary calcification-Per CTA, abdominal aortic atherosclerosis.   I have spent at least 18 minutes non face to face with patient today which includes the time spent for preparing to see the patient  (e.g., review of test, records), ordering medications and test, psychoeducation and supportive psychotherapy, care coordination as well as documenting clinical information in electronic health record.     This note was generated in part or whole with voice recognition software. Voice recognition is usually quite accurate but there are transcription errors that can and very often do occur. I apologize for any typographical errors that were not detected and corrected.  Ursula Alert, MD 03/07/2021, 11:54 AM

## 2021-03-11 ENCOUNTER — Ambulatory Visit
Admission: RE | Admit: 2021-03-11 | Discharge: 2021-03-11 | Disposition: A | Discharge status: Home / Self Care | Payer: Managed Care, Other (non HMO) | Source: Ambulatory Visit | Attending: Family Medicine | Admitting: Family Medicine

## 2021-03-11 ENCOUNTER — Other Ambulatory Visit: Payer: Self-pay

## 2021-03-11 DIAGNOSIS — Z1231 Encounter for screening mammogram for malignant neoplasm of breast: Secondary | ICD-10-CM | POA: Diagnosis present

## 2021-05-09 ENCOUNTER — Encounter: Payer: Self-pay | Admitting: Psychiatry

## 2021-05-09 ENCOUNTER — Other Ambulatory Visit: Payer: Self-pay

## 2021-05-09 ENCOUNTER — Telehealth (INDEPENDENT_AMBULATORY_CARE_PROVIDER_SITE_OTHER): Payer: 59 | Admitting: Psychiatry

## 2021-05-09 DIAGNOSIS — F3178 Bipolar disorder, in full remission, most recent episode mixed: Secondary | ICD-10-CM | POA: Diagnosis not present

## 2021-05-09 DIAGNOSIS — F17200 Nicotine dependence, unspecified, uncomplicated: Secondary | ICD-10-CM

## 2021-05-09 DIAGNOSIS — F4310 Post-traumatic stress disorder, unspecified: Secondary | ICD-10-CM

## 2021-05-09 MED ORDER — LAMOTRIGINE 100 MG PO TABS: ORAL_TABLET | ORAL | 1 refills | Status: DC

## 2021-05-09 MED ORDER — PAROXETINE HCL 40 MG PO TABS: ORAL_TABLET | ORAL | 1 refills | Status: DC

## 2021-05-09 NOTE — Progress Notes (Signed)
Virtual Visit via Video Note  I connected with Debra [name] on 05/09/21 at 10:00 AM EDT by a video enabled telemedicine application and verified that I am speaking with the correct person using two identifiers.  Location Provider Location : [facility] Patient Location : Home  Participants: Patient , Provider    I discussed the limitations of evaluation and management by telemedicine and the availability of in person appointments. The patient expressed understanding and agreed to proceed.     I discussed the assessment and treatment plan with the patient. The patient was provided an opportunity to ask questions and all were answered. The patient agreed with the plan and demonstrated an understanding of the instructions.   The patient was advised to call back or seek an in-person evaluation if the symptoms worsen or if the condition fails to improve as anticipated.   [place] MD OP Progress Note  05/09/2021 10:27 AM Debra [name]  MRN:  [id number]  Chief Complaint:  Chief Complaint    Follow-up; Anxiety     HPI: Debra [name] is a 60 year old Caucasian female, married, disabled, lives in [place], has a history of bipolar disorder, PTSD, tobacco use disorder, parkinsonian tremors, chronic back pain, OSA, hypertension, possible REM sleep behavior disorder was evaluated by telemedicine today.  Patient today reports she is currently making progress with regards to her anxiety.  She reports she was feeling anxious few weeks ago since she did not have a lot of help at home.  She however reports her husband is retired now and that has helped.  She has a lot of support right now.  She reports her anxiety hence is getting better.  Patient denies any sadness or crying spells.  Patient denies any suicidality, homicidality or perceptual disturbances.  Patient denies any sleep problems.  She continues to have tremors and reports her neurologist recently increased her propranolol which helps to some  extent.  She continues to have back pain which does have an impact on her mood however she has been coping well.  She takes Advil as needed which helps.  Patient denies any other concerns today.    Visit Diagnosis:    ICD-10-CM   1. Bipolar disorder, in full remission, most recent episode mixed (HCC)  F31.78 PARoxetine (PAXIL) 40 MG tablet  2. PTSD (post-traumatic stress disorder)  F43.10 lamoTRIgine (LAMICTAL) 100 MG tablet  3. Tobacco use disorder  F17.200     Past Psychiatric History: I have reviewed past psychiatric history from progress note on 02/16/2019  Past Medical History:  Past Medical History:  Diagnosis Date  . Anxiety   . Arthritis   . C. difficile colitis   . C. difficile colitis   . Depression   . Diabetes mellitus without complication ([employer])    Type II  . Diabetes mellitus, type II ([place])   . Family history of adverse reaction to anesthesia    Mother - BP drops  . Head injury, closed, with brief LOC ([place]) 2011ish  . Hyperlipidemia   . Hypertension   . Insomnia   . Myalgia 03/29/2018   Due to statin  . Obesity   . Parkinson's disease ([place])   . Seasonal allergies   . Seizures ([place])   . Tobacco abuse     Past Surgical History:  Procedure Laterality Date  . ABDOMINAL HYSTERECTOMY  2012  . BREAST BIOPSY Right   . CESAREAN SECTION  1992  . CHOLECYSTECTOMY  2013  . LUMBAR LAMINECTOMY/DECOMPRESSION MICRODISCECTOMY Left 09/10/2015  Procedure: Left L4-5 Foraminotomy/Left L5-S1 Diskectomy;  Surgeon: Leeroy Cha, MD;  Location: [facility];  Service: Neurosurgery;  Laterality: Left;  Left L4-5 Foraminotomy/Left L5-S1 Diskectomy    Family Psychiatric History: I have reviewed family psychiatric history from progress note on 02/16/2019  Family History:  Family History  Problem Relation Age of Onset  . COPD Mother   . Bipolar disorder Father   . Diabetes Mellitus II Father   . Bipolar disorder Brother   . Diabetes Mellitus II Brother   . Bipolar disorder  Brother     Social History: I have reviewed social history from progress note on 02/16/2019 Social History   Socioeconomic History  . Marital status: Married    Spouse name: Debra [name]  . Number of children: 1  . Years of education: Not on file  . Highest education level: Associate degree: occupational, Hotel manager, or vocational program  Occupational History  . Occupation: Disabled  Tobacco Use  . Smoking status: Current Every Day Smoker    Years: 24.00    Types: Cigarettes    Start date: 03/19/2004  . Smokeless tobacco: Never Used  . Tobacco comment: 2-3 cig per day since last month - reported 01/01/2021  Vaping Use  . Vaping Use: Never used  Substance and Sexual Activity  . Alcohol use: Yes    Alcohol/week: 0.0 standard drinks    Comment: occasional- rare  . Drug use: No  . Sexual activity: Not Currently  Other Topics Concern  . Not on file  Social History Narrative   Live in private residence w/ husband and their friend who is wheelchair bound, her end of the house is wheelchair accessible.    Social Determinants of Health   Financial Resource Strain: Not on file  Food Insecurity: Not on file  Transportation Needs: Not on file  Physical Activity: Not on file  Stress: Not on file  Social Connections: Not on file    Allergies:  Allergies  Allergen Reactions  . Clindamycin/Lincomycin     Pt states caused C-Diff  . Montelukast Hives and Rash    Asthma attacks Asthma attacks  . Primidone     Diffused erythematous and itchy rash  . Sulfasalazine Hives    Other reaction(s): Unknown  . Metformin And Related Diarrhea  . Benadryl [Diphenhydramine Hcl] Swelling    blistering  . Canagliflozin Other (See Comments)    Other reaction(s): Other (See Comments) Low bp Low bp Low bp  . Depakote Er [Divalproex Sodium Er] Other (See Comments)    seizures  . Dilaudid [Hydromorphone Hcl] Other (See Comments)    seizures  . Diphenhydramine-Zinc Acetate Other (See Comments)  .  Erythromycin   . Ethanol   . Haloperidol Other (See Comments)  . Hydromorphone Other (See Comments)  . Neosporin [Neomycin-Bacitracin Zn-Polymyx] Swelling    blistering  . Singulair [Montelukast Sodium] Other (See Comments)    Asthma attacks  . Sitagliptin Other (See Comments)  . Statins Other (See Comments)    Other reaction(s): Unknown  . Sulfa Antibiotics Hives    Other reaction(s): Unknown  . Victoza [Liraglutide] Other (See Comments) and Nausea And Vomiting    Other reaction(s): Other (See Comments) pancreatitis pancreatitis    Metabolic Disorder Labs: Lab Results  Component Value Date   HGBA1C 8.3 (H) 09/21/2019   MPG 192 09/21/2019   MPG 214 06/16/2019   No results found for: PROLACTIN Lab Results  Component Value Date   CHOL 132 12/13/2020   TRIG 190 (H) 12/13/2020  HDL 52 12/13/2020   CHOLHDL 2.5 12/13/2020   VLDL 38 12/13/2020   LDLCALC 42 12/13/2020   LDLCALC 24 09/21/2019   Lab Results  Component Value Date   TSH 1.671 02/16/2019   TSH 2.160 08/20/2015    Therapeutic Level Labs: No results found for: LITHIUM No results found for: VALPROATE No components found for:  CBMZ  Current Medications: Current Outpatient Medications  Medication Sig Dispense Refill  . aspirin EC 81 MG tablet Take 81 mg by mouth daily.    . blood glucose meter kit and supplies Use up to four times daily as directed, Dx E11.65, LON 99 months 1 each 0  . calcium carbonate (OSCAL) 1500 (600 Ca) MG TABS tablet Take 1 tablet by mouth daily with breakfast.     . Cholecalciferol 5000 units capsule Take 5,000 Units by mouth.     . clonazePAM (KLONOPIN) 0.5 MG tablet Take 1 tablet (0.5 mg total) by mouth as directed. Take one tablet once a day as needed up to 2-3 times a week only for severe anxiety attacks 12 tablet 0  . clotrimazole-betamethasone (LOTRISONE) cream Apply 1 application topically 2 (two) times daily. Sparingly to affected skin for up to 7 d at a time 30 g 1  .  Continuous Blood Gluc Receiver (FREESTYLE LIBRE 14 DAY READER) DEVI 1 Device.    . Continuous Blood Gluc Sensor (FREESTYLE LIBRE 14 DAY SENSOR) MISC 1 kit.    . Evolocumab (REPATHA SURECLICK) 294 MG/ML SOAJ Inject 140 mg into the skin every 14 (fourteen) days. 6 mL 3  . famotidine (PEPCID) 20 MG tablet Take 1 tablet (20 mg total) by mouth daily. 30 tablet 0  . FARXIGA 10 MG TABS tablet TAKE 1 TABLET DAILY (HIGHER DOSE) 90 tablet 3  . fluconazole (DIFLUCAN) 150 MG tablet PLEASE SEE ATTACHED FOR DETAILED DIRECTIONS    . folic acid (FOLVITE) 765 MCG tablet Take 800 mcg by mouth daily.     [name] glucose blood (ONE TOUCH ULTRA TEST) test strip USE UP TO FOUR TIMES A DAY AS DIRECTED; Dx 11.51, LON 99 months 300 each 3  . HUMALOG KWIKPEN 100 UNIT/ML KwikPen INJECT 20 UNITS UNDER THE SKIN IN THE MORNING AND 35 UNITS WITH LUNCH AND EVENING MEAL (INJECT WITH MEALS) 90 mL 3  . hydrochlorothiazide (HYDRODIURIL) 25 MG tablet TAKE 1 TABLET DAILY (DOSE INCREASE, REPLACES ALL OTHER HCTZ PRESCRIPTIONS) 90 tablet 3  . hydrocortisone cream 1 % Apply topically 4 (four) times daily as needed for itching. 30 g 0  . Insulin Pen Needle 29G X 12.7MM MISC 1 each by Does not apply route as needed. 100 each 3  . lamoTRIgine (LAMICTAL) 100 MG tablet TAKE ONE AND ONE-HALF TABLETS DAILY (DOSE INCREASE) 135 tablet 1  . lisinopril (ZESTRIL) 40 MG tablet Take 0.5 tablets (20 mg total) by mouth daily. 45 tablet 3  . Melatonin 10 MG TABS Take 1 tablet by mouth at bedtime.    . Multiple Vitamin (MULTI-VITAMIN) tablet Take 1 tablet by mouth daily.     . Multiple Vitamins-Minerals (CENTRUM WOMEN) TABS Take 1 tablet by mouth.    [name] PARoxetine (PAXIL) 40 MG tablet TAKE 1 TABLET EVERY MORNING 90 tablet 1  . pioglitazone (ACTOS) 30 MG tablet TAKE 1 TABLET DAILY 90 tablet 0  . propranolol (INNOPRAN XL) 80 MG 24 hr capsule TAKE 1 CAPSULE DAILY    . propranolol ER (INDERAL LA) 80 MG 24 hr capsule Take 80 mg by mouth daily.    [name]  spironolactone  (ALDACTONE) 25 MG tablet Take 0.5 tablets (12.5 mg total) by mouth daily. 15 tablet 5  . TRESIBA FLEXTOUCH 200 UNIT/ML SOPN Inject 120 Units into the skin at bedtime. 36 pen 5   No current facility-administered medications for this visit.     Musculoskeletal: Strength & Muscle Tone: UTA Gait & Station: UTA Patient leans: N/A  Psychiatric Specialty Exam: Review of Systems  Musculoskeletal: Positive for back pain.  Neurological: Positive for tremors.  Psychiatric/Behavioral: The patient is nervous/anxious.   All other systems reviewed and are negative.   There were no vitals taken for this visit.There is no height or weight on file to calculate BMI.  General Appearance: Casual  Eye Contact:  Fair  Speech:  Clear and Coherent  Volume:  Normal  Mood:  Anxious improving  Affect:  Congruent  Thought Process:  Goal Directed and Descriptions of Associations: Intact  Orientation:  Full (Time, Place, and Person)  Thought Content: Logical   Suicidal Thoughts:  No  Homicidal Thoughts:  No  Memory:  Immediate;   Fair Recent;   Fair Remote;   Fair  Judgement:  Fair  Insight:  Fair  Psychomotor Activity:  Tremor  Concentration:  Concentration: Fair and Attention Span: Fair  Recall:  [employer] of Knowledge: Fair  Language: Fair  Akathisia:  No  Handed:  Right  AIMS (if indicated): UTA  Assets:  Communication Skills Desire for Improvement Housing Intimacy Social Support  ADL's:  Intact  Cognition: WNL  Sleep:  Fair   Screenings: GAD-7   Flowsheet Row Video Visit from 05/09/2021 in [facility]  Total GAD-7 Score [place] Visit from 09/21/2016 in [facility]  Total Score (max 30 points ) 27    PHQ2-9   Flowsheet Row Video Visit from 05/09/2021 in [facility] Office Visit from 02/16/2020 in [facility] Video Visit from 12/06/2019 in [facility] Office Visit from 09/21/2019 in [facility] Office Visit from 09/07/2019 in [facility]  PHQ-2 Total Score 1 5 0 0 0  PHQ-9 Total Score -- 11 0 0 0       Assessment and Plan: JADWIGA FAIDLEY is a 60 year old Caucasian female, married, disabled, lives in [place], has a history of bipolar disorder, PTSD, diabetes, OSA on CPAP, chronic pain, multiple medical problems was evaluated by telemedicine today.  Patient is currently making progress.  Plan as noted below.  Plan Bipolar disorder in remission Lamictal 150 mg p.o. daily Paxil 40 mg daily Klonopin 0.5 mg as needed 2-3 times a week only for severe anxiety attacks.  She has been limiting use  PTSD-stable Paxil 40 mg daily  Insomnia-stable Continue melatonin 10 mg p.o. nightly CPAP for OSA  Tobacco use disorder- improving Provided counseling  Patient to continue to follow-up with neurology for her tremors.  Follow-up in clinic in 3 months or sooner if needed.   This note was generated in part or whole with voice recognition software. Voice recognition is usually quite accurate but there are transcription errors that can and very often do occur. I apologize for any typographical errors that were not detected and corrected.      Ursula Alert, MD 05/09/2021, 10:27 AM

## 2021-05-30 ENCOUNTER — Telehealth: Payer: Self-pay | Admitting: *Deleted

## 2021-05-30 NOTE — Telephone Encounter (Signed)
[employer] is reviewing your PA request. Typically an electronic response will be received within 24-72 hours. To check for an update later, open this request from your dashboard.    You may close this dialog and return to your dashboard to perform other tasks.

## 2021-05-30 NOTE — Telephone Encounter (Signed)
Pt requiring PA for Repatha 140 mg/ml. PA has been submitted through via covermymeds.

## 2021-06-06 ENCOUNTER — Other Ambulatory Visit: Payer: Self-pay

## 2021-06-06 ENCOUNTER — Ambulatory Visit (INDEPENDENT_AMBULATORY_CARE_PROVIDER_SITE_OTHER): Payer: Medicare Other | Admitting: Licensed Clinical Social Worker

## 2021-06-06 DIAGNOSIS — F3178 Bipolar disorder, in full remission, most recent episode mixed: Secondary | ICD-10-CM | POA: Diagnosis not present

## 2021-06-06 NOTE — Progress Notes (Signed)
Virtual Visit via Video Note  I connected with [name] on 06/06/21 at 11:00 AM EDT by a video enabled telemedicine application and verified that I am speaking with the correct person using two identifiers.  Location: Patient: home Provider: remote office [place], [place])   I discussed the limitations of evaluation and management by telemedicine and the availability of in person appointments. The patient expressed understanding and agreed to proceed.   I discussed the assessment and treatment plan with the patient. The patient was provided an opportunity to ask questions and all were answered. The patient agreed with the plan and demonstrated an understanding of the instructions.   The patient was advised to call back or seek an in-person evaluation if the symptoms worsen or if the condition fails to improve as anticipated.  I provided 32 minutes of non-face-to-face time during this encounter.   Tysin Salada R Marylee Belzer, LCSW   THERAPIST PROGRESS NOTE  Session Time: 11-11:32a  Participation Level: Active  Behavioral Response: Neat and Well GroomedAlertEuthymic  Type of Therapy: Individual Therapy  Treatment Goals addressed: Coping  Interventions: Psychosocial Skills: caregiver stress and Supportive  Summary: YATZARI JONSSON is a 60 y.o. female who presenting for counseling services to assist with overall caregiver stress. [place] reports she currently lives with her 36 year old mother, who is not doing well. Patient reports that Hospice services are currently being provided in her home. Patient reports that she is the primary caregiver for her mother, there are no other family members that live close by. Patient reports her daughter lives in [place].   Patient reports that she does have mood fluctuations, depending on what is going on. Patient reports that she is not experiencing any symptoms of depression at time obsession.   Patient reports overall health concerns are good other than  type one diabetes, which patient feels she is managing well.   Patient reports that things that make her feel better are:  doing crafts, and having some alone time. Encouraged patient to make sure that when she focuses on self care to incorporate these things in her self care protocol. Patient reports she only leaves the house two times a week, and she's running errands in those two times.   Patient reports she sleeps fairly well, but will get up every night at 3:00 AM to give her mother a breathing treatment, and has no trouble falling back to sleep. Patient reports that occasionally she will take melatonin to help her fall asleep. Discussed importance of overall life balance and self care..   Continued recommendations are as follows: self care behaviors, positive social engagements, focusing on overall work/home/life balance, and focusing on positive physical and emotional wellness.   Suicidal/Homicidal: No  Therapist Response: Initial session with patient--developed treatment plan and discussed goals.  Plan: Return again in 4 weeks.  Diagnosis: Axis I: Bipolar, Depressed    Axis II: No diagnosis    Ernest Haber Anuoluwapo Mefferd, LCSW 06/06/2021

## 2021-06-19 ENCOUNTER — Other Ambulatory Visit: Payer: Self-pay | Admitting: Family

## 2021-06-20 NOTE — Telephone Encounter (Signed)
Request Reference Number: [id number].  REPATHA SURE INJ 140MG /ML is approved through 11/29/2021.

## 2021-06-23 LAB — COLOGUARD: COLOGUARD: NEGATIVE

## 2021-08-06 ENCOUNTER — Other Ambulatory Visit: Payer: Self-pay

## 2021-08-06 ENCOUNTER — Telehealth (INDEPENDENT_AMBULATORY_CARE_PROVIDER_SITE_OTHER): Payer: Medicare Other | Admitting: Psychiatry

## 2021-08-06 ENCOUNTER — Encounter: Payer: Self-pay | Admitting: Psychiatry

## 2021-08-06 DIAGNOSIS — F4310 Post-traumatic stress disorder, unspecified: Secondary | ICD-10-CM | POA: Diagnosis not present

## 2021-08-06 DIAGNOSIS — F3178 Bipolar disorder, in full remission, most recent episode mixed: Secondary | ICD-10-CM

## 2021-08-06 DIAGNOSIS — F17200 Nicotine dependence, unspecified, uncomplicated: Secondary | ICD-10-CM | POA: Diagnosis not present

## 2021-08-06 NOTE — Progress Notes (Signed)
Virtual Visit via Video Note  I connected with Debra [name] on 08/06/21 at  2:00 PM EDT by a video enabled telemedicine application and verified that I am speaking with the correct person using two identifiers.  Location Provider Location : [facility] Patient Location : Home  Participants: Patient , Provider   I discussed the limitations of evaluation and management by telemedicine and the availability of in person appointments. The patient expressed understanding and agreed to proceed.     I discussed the assessment and treatment plan with the patient. The patient was provided an opportunity to ask questions and all were answered. The patient agreed with the plan and demonstrated an understanding of the instructions.   The patient was advised to call back or seek an in-person evaluation if the symptoms worsen or if the condition fails to improve as anticipated.    [facility] MD OP Progress Note  08/06/2021 2:08 PM Debra [name]  MRN:  [id number]  Chief Complaint:  Chief Complaint   Follow-up; Depression    HPI: Debra [name] is a 60 year old Caucasian female, married, disabled, lives in [place], has a history of bipolar disorder, PTSD, tobacco use disorder, parkinsonian tremors, chronic back pain, OSA, hypertension, was evaluated by telemedicine today.  Patient today reports she recently had anxiety attacks due to situational stressors.  She reports she was told that her roommate may have dementia which she never knew about until now.  That triggered her anxiety.  She took a Klonopin 0.5 mg when she felt anxious and slept through the night.  She reports currently she is able to cope with her anxiety  She denies any mood swings.  Denies any depression.  Denies any suicidality, homicidality or perceptual disturbances.  Patient denies side effects to medications.  She continues to have postural tremors, currently under the care of neurology.  Has been trying to cut back on smoking.  Patient  denies any other concerns today.  Visit Diagnosis:    ICD-10-CM   1. Bipolar disorder, in full remission, most recent episode mixed ([place])  F31.78 TSH    2. PTSD (post-traumatic stress disorder)  F43.10     3. Tobacco use disorder  F17.200       Past Psychiatric History: Reviewed past psychiatric history from progress note on 02/16/2019.  Past Medical History:  Past Medical History:  Diagnosis Date   Anxiety    Arthritis    C. difficile colitis    C. difficile colitis    Depression    Diabetes mellitus without complication ([place])    Type II   Diabetes mellitus, type II ([place])    Family history of adverse reaction to anesthesia    Mother - BP drops   Head injury, closed, with brief LOC ([place]) 2011ish   Hyperlipidemia    Hypertension    Insomnia    Myalgia 03/29/2018   Due to statin   Obesity    Parkinson's disease ([place])    Seasonal allergies    Seizures ([place])    Tobacco abuse     Past Surgical History:  Procedure Laterality Date   ABDOMINAL HYSTERECTOMY  2012   BREAST BIOPSY Right    CESAREAN SECTION  1992   CHOLECYSTECTOMY  2013   LUMBAR LAMINECTOMY/DECOMPRESSION MICRODISCECTOMY Left 09/10/2015   Procedure: Left L4-5 Foraminotomy/Left L5-S1 Diskectomy;  Surgeon: Leeroy Cha, MD;  Location: [facility];  Service: Neurosurgery;  Laterality: Left;  Left L4-5 Foraminotomy/Left L5-S1 Diskectomy    Family Psychiatric History: Reviewed  family psychiatric history from progress note on 02/16/2019.  Family History:  Family History  Problem Relation Age of Onset   COPD Mother    Bipolar disorder Father    Diabetes Mellitus II Father    Bipolar disorder Brother    Diabetes Mellitus II Brother    Bipolar disorder Brother     Social History: Reviewed social history from progress note on 02/16/2019. Social History   Socioeconomic History   Marital status: Married    Spouse name: [name]   Number of children: 1   Years of education: Not on file   Highest education  level: Associate degree: occupational, Hotel manager, or vocational program  Occupational History   Occupation: Disabled  Tobacco Use   Smoking status: Every Day    Years: 24.00    Types: Cigarettes    Start date: 03/19/2004   Smokeless tobacco: Never   Tobacco comments:    2-3 cig per day since last month - reported 01/01/2021  Vaping Use   Vaping Use: Never used  Substance and Sexual Activity   Alcohol use: Yes    Alcohol/week: 0.0 standard drinks    Comment: occasional- rare   Drug use: No   Sexual activity: Not Currently  Other Topics Concern   Not on file  Social History Narrative   Live in private residence w/ husband and their friend who is wheelchair bound, her end of the house is wheelchair accessible.    Social Determinants of Health   Financial Resource Strain: Not on file  Food Insecurity: Not on file  Transportation Needs: Not on file  Physical Activity: Not on file  Stress: Not on file  Social Connections: Not on file    Allergies:  Allergies  Allergen Reactions   Clindamycin/Lincomycin     Pt states caused C-Diff   Montelukast Hives and Rash    Asthma attacks Asthma attacks   Primidone     Diffused erythematous and itchy rash   Sulfasalazine Hives    Other reaction(s): Unknown   Metformin And Related Diarrhea   Benadryl [Diphenhydramine Hcl] Swelling    blistering   Canagliflozin Other (See Comments)    Other reaction(s): Other (See Comments) Low bp Low bp Low bp   Depakote Er [Divalproex Sodium Er] Other (See Comments)    seizures   Dilaudid [Hydromorphone Hcl] Other (See Comments)    seizures   Diphenhydramine-Zinc Acetate Other (See Comments)   Erythromycin    Ethanol    Haloperidol Other (See Comments)   Hydromorphone Other (See Comments)   Neosporin [Neomycin-Bacitracin Zn-Polymyx] Swelling    blistering   Singulair [Montelukast Sodium] Other (See Comments)    Asthma attacks   Sitagliptin Other (See Comments)   Statins Other (See  Comments)    Other reaction(s): Unknown   Sulfa Antibiotics Hives    Other reaction(s): Unknown   Victoza [Liraglutide] Other (See Comments) and Nausea And Vomiting    Other reaction(s): Other (See Comments) pancreatitis pancreatitis    Metabolic Disorder Labs: Lab Results  Component Value Date   HGBA1C 8.3 (H) 09/21/2019   MPG 192 09/21/2019   MPG 214 06/16/2019   No results found for: PROLACTIN Lab Results  Component Value Date   CHOL 132 12/13/2020   TRIG 190 (H) 12/13/2020   HDL 52 12/13/2020   CHOLHDL 2.5 12/13/2020   VLDL 38 12/13/2020   LDLCALC 42 12/13/2020   LDLCALC 24 09/21/2019   Lab Results  Component Value Date   TSH 1.671 02/16/2019  TSH 2.160 08/20/2015    Therapeutic Level Labs: No results found for: LITHIUM No results found for: VALPROATE No components found for:  CBMZ  Current Medications: Current Outpatient Medications  Medication Sig Dispense Refill   aspirin EC 81 MG tablet Take 81 mg by mouth daily.     blood glucose meter kit and supplies Use up to four times daily as directed, Dx E11.65, LON 99 months 1 each 0   calcium carbonate (OSCAL) 1500 (600 Ca) MG TABS tablet Take 1 tablet by mouth daily with breakfast.      Cholecalciferol 5000 units capsule Take 5,000 Units by mouth.      clonazePAM (KLONOPIN) 0.5 MG tablet Take 1 tablet (0.5 mg total) by mouth as directed. Take one tablet once a day as needed up to 2-3 times a week only for severe anxiety attacks 12 tablet 0   clotrimazole-betamethasone (LOTRISONE) cream Apply 1 application topically 2 (two) times daily. Sparingly to affected skin for up to 7 d at a time 30 g 1   Continuous Blood Gluc Receiver (FREESTYLE LIBRE 14 DAY READER) DEVI 1 Device.     Continuous Blood Gluc Sensor (FREESTYLE LIBRE 14 DAY SENSOR) MISC 1 kit.     Evolocumab (REPATHA SURECLICK) 379 MG/ML SOAJ Inject 140 mg into the skin every 14 (fourteen) days. 6 mL 3   famotidine (PEPCID) 20 MG tablet Take 1 tablet (20 mg  total) by mouth daily. 30 tablet 0   FARXIGA 10 MG TABS tablet TAKE 1 TABLET DAILY (HIGHER DOSE) 90 tablet 3   fluconazole (DIFLUCAN) 150 MG tablet PLEASE SEE ATTACHED FOR DETAILED DIRECTIONS     folic acid (FOLVITE) 024 MCG tablet Take 800 mcg by mouth daily.      furosemide (LASIX) 20 MG tablet Take 20 mg by mouth daily.     glucose blood (ONE TOUCH ULTRA TEST) test strip USE UP TO FOUR TIMES A DAY AS DIRECTED; Dx 11.51, LON 99 months 300 each 3   HUMALOG KWIKPEN 100 UNIT/ML KwikPen INJECT 20 UNITS UNDER THE SKIN IN THE MORNING AND 35 UNITS WITH LUNCH AND EVENING MEAL (INJECT WITH MEALS) 90 mL 3   hydrochlorothiazide (HYDRODIURIL) 25 MG tablet TAKE 1 TABLET DAILY (DOSE INCREASE, REPLACES ALL OTHER HCTZ PRESCRIPTIONS) 90 tablet 3   hydrocortisone cream 1 % Apply topically 4 (four) times daily as needed for itching. 30 g 0   Insulin Pen Needle 29G X 12.7MM MISC 1 each by Does not apply route as needed. 100 each 3   lamoTRIgine (LAMICTAL) 100 MG tablet TAKE ONE AND ONE-HALF TABLETS DAILY (DOSE INCREASE) 135 tablet 1   lisinopril (ZESTRIL) 40 MG tablet Take 0.5 tablets (20 mg total) by mouth daily. 45 tablet 3   Melatonin 10 MG TABS Take 1 tablet by mouth at bedtime.     Multiple Vitamin (MULTI-VITAMIN) tablet Take 1 tablet by mouth daily.      Multiple Vitamins-Minerals (CENTRUM WOMEN) TABS Take 1 tablet by mouth.     PARoxetine (PAXIL) 40 MG tablet TAKE 1 TABLET EVERY MORNING 90 tablet 1   pioglitazone (ACTOS) 30 MG tablet TAKE 1 TABLET DAILY 90 tablet 0   propranolol (INNOPRAN XL) 80 MG 24 hr capsule TAKE 1 CAPSULE DAILY     propranolol ER (INDERAL LA) 80 MG 24 hr capsule Take 80 mg by mouth daily.     spironolactone (ALDACTONE) 25 MG tablet TAKE 1/2 TABLET BY MOUTH EVERY DAY 45 tablet 1   TRESIBA FLEXTOUCH 200 UNIT/ML  SOPN Inject 120 Units into the skin at bedtime. 36 pen 5   No current facility-administered medications for this visit.     Musculoskeletal: Strength & Muscle Tone:   UTA Gait & Station: normal Patient leans: N/A  Psychiatric Specialty Exam: Review of Systems  Neurological:  Positive for tremors.  Psychiatric/Behavioral:  The patient is nervous/anxious.   All other systems reviewed and are negative.  There were no vitals taken for this visit.There is no height or weight on file to calculate BMI.  General Appearance: Casual  Eye Contact:  Fair  Speech:  Clear and Coherent  Volume:  Normal  Mood:  Anxious  Affect:  Appropriate  Thought Process:  Goal Directed and Descriptions of Associations: Intact  Orientation:  Full (Time, Place, and Person)  Thought Content: Logical   Suicidal Thoughts:  No  Homicidal Thoughts:  No  Memory:  Immediate;   Fair Recent;   Fair Remote;   Fair  Judgement:  Intact  Insight:  Fair  Psychomotor Activity:  postural tremors  Concentration:  Concentration: Fair and Attention Span: Fair  Recall:  [employer] of Knowledge: Fair  Language: Fair  Akathisia:  No  Handed:  Right  AIMS (if indicated): done  Assets:  Communication Skills Desire for Improvement Housing Intimacy Social Support  ADL's:  Intact  Cognition: WNL  Sleep:  Fair   Screenings: GAD-7    Flowsheet Row Video Visit from 05/09/2021 in [facility]  Total GAD-7 Score [place] Visit from 09/21/2016 in [facility]  Total Score (max 30 points ) 27      PHQ2-9    Flowsheet Row Video Visit from 05/09/2021 in [place] Visit from 02/16/2020 in [facility] Video Visit from 12/06/2019 in [facility] Office Visit from 09/21/2019 in [facility] Office Visit from 09/07/2019 in [facility]  PHQ-2 Total Score 1 5 0 0 0  PHQ-9 Total Score -- 11 0 0 0        Assessment and Plan: Debra [name] is a 60 year old Caucasian female, married, disabled, lives in  [place], has a history of bipolar disorder, PTSD, diabetes, OSA on CPAP, chronic pain, multiple medical problems was evaluated by telemedicine today.  Patient is currently stable.  Plan as noted below.  Plan  Bipolar disorder in remission Lamictal 150 mg p.o. daily Paxil 40 mg p.o. daily Klonopin 0.5 mg as needed 2-3 times a week only for severe panic attacks.  Patient advised to take half tablet of Klonopin if she feels the 0.5 mg is causing excessive sleepiness. AIMS - 0  PTSD-stable Paxil 40 mg p.o. daily  Insomnia-stable Melatonin 10 mg p.o. nightly CPAP for OSA  Tobacco use disorder-improving Provided counseling for 2 minutes.  Patient to continue follow-up with neurology for tremors.  Follow-up in clinic in 3 months in office.  This note was generated in part or whole with voice recognition software. Voice recognition is usually quite accurate but there are transcription errors that can and very often do occur. I apologize for any typographical errors that were not detected and corrected.    Ursula Alert, MD 08/07/2021, 9:28 AM

## 2021-11-04 ENCOUNTER — Other Ambulatory Visit: Payer: Self-pay | Admitting: *Deleted

## 2021-11-04 ENCOUNTER — Telehealth: Payer: Self-pay

## 2021-11-04 MED ORDER — REPATHA SURECLICK 140 MG/ML ~~LOC~~ SOAJ: 140.0000 mg | SUBCUTANEOUS | 3 refills | Status: DC

## 2021-11-04 NOTE — Telephone Encounter (Signed)
PA for Repatha started in [employer]  Marquesha Robideau (Key: [id number]) Repatha SureClick 140MG /ML auto-injectors

## 2021-11-11 ENCOUNTER — Other Ambulatory Visit: Payer: Self-pay

## 2021-11-11 ENCOUNTER — Ambulatory Visit (INDEPENDENT_AMBULATORY_CARE_PROVIDER_SITE_OTHER): Payer: Medicare Other | Admitting: Psychiatry

## 2021-11-11 ENCOUNTER — Encounter: Payer: Self-pay | Admitting: Psychiatry

## 2021-11-11 VITALS — BP 175/77 | HR 75 | Temp 98.7°F | Wt 311.0 lb

## 2021-11-11 DIAGNOSIS — F3178 Bipolar disorder, in full remission, most recent episode mixed: Secondary | ICD-10-CM | POA: Diagnosis not present

## 2021-11-11 DIAGNOSIS — I10 Essential (primary) hypertension: Secondary | ICD-10-CM | POA: Diagnosis not present

## 2021-11-11 DIAGNOSIS — F4310 Post-traumatic stress disorder, unspecified: Secondary | ICD-10-CM

## 2021-11-11 DIAGNOSIS — F17200 Nicotine dependence, unspecified, uncomplicated: Secondary | ICD-10-CM | POA: Diagnosis not present

## 2021-11-11 MED ORDER — LAMOTRIGINE 100 MG PO TABS: ORAL_TABLET | ORAL | 1 refills | Status: DC

## 2021-11-11 MED ORDER — PAROXETINE HCL 40 MG PO TABS: ORAL_TABLET | ORAL | 1 refills | Status: DC

## 2021-11-11 NOTE — Progress Notes (Signed)
[facility] MD OP Progress Note  11/11/2021 2:27 PM Debra [name]  MRN:  [id number]  Chief Complaint:  Chief Complaint   Follow-up; Depression; Anxiety    HPI: Debra [name] is a 60 year old Caucasian female, married, disabled, lives in [place], has a history of bipolar disorder, PTSD, tobacco use disorder, parkinsonian tremors, chronic back pain, OSA, hypertension was evaluated in office today.  Patient today reports she had a good Thanksgiving holiday with her family.  Patient reports she is overall doing well with regards to her mood.  Her husband has retired and is there to help her at home.  Her mother who stays with her is currently doing so well with her health, that makes her happy.  Patient continues to have relationship struggles with her roommate however reports she and her husband are planning to buy a new home and move out.  Patient is compliant on her medications.  Recently started on Topamax and reports that has taken care of her tremors.  Denies side effects to any of her medications.  Denies suicidality, homicidality or perceptual disturbances.  Patient denies any other concerns today.  Visit Diagnosis:    ICD-10-CM   1. Bipolar disorder, in full remission, most recent episode mixed ([facility])  F31.78 PARoxetine (PAXIL) 40 MG tablet    2. PTSD (post-traumatic stress disorder)  F43.10 lamoTRIgine (LAMICTAL) 100 MG tablet    3. Tobacco use disorder  F17.200     4. Elevated blood pressure reading in office with diagnosis of hypertension  I10       Past Psychiatric History: Reviewed past psychiatric history from progress note on 02/16/2019.  Past Medical History:  Past Medical History:  Diagnosis Date   Anxiety    Arthritis    C. difficile colitis    C. difficile colitis    Depression    Diabetes mellitus without complication ([place])    Type II   Diabetes mellitus, type II ([place])    Family history of adverse reaction to anesthesia    Mother - BP drops   Head injury,  closed, with brief LOC ([place]) 2011ish   Hyperlipidemia    Hypertension    Insomnia    Myalgia 03/29/2018   Due to statin   Obesity    Parkinson's disease ([place])    Seasonal allergies    Seizures ([place])    Tobacco abuse     Past Surgical History:  Procedure Laterality Date   ABDOMINAL HYSTERECTOMY  2012   BREAST BIOPSY Right    CESAREAN SECTION  1992   CHOLECYSTECTOMY  2013   LUMBAR LAMINECTOMY/DECOMPRESSION MICRODISCECTOMY Left 09/10/2015   Procedure: Left L4-5 Foraminotomy/Left L5-S1 Diskectomy;  Surgeon: Leeroy Cha, MD;  Location: [facility];  Service: Neurosurgery;  Laterality: Left;  Left L4-5 Foraminotomy/Left L5-S1 Diskectomy    Family Psychiatric History: Reviewed family psychiatric history from progress note on 02/16/2019.  Family History:  Family History  Problem Relation Age of Onset   COPD Mother    Bipolar disorder Father    Diabetes Mellitus II Father    Bipolar disorder Brother    Diabetes Mellitus II Brother    Bipolar disorder Brother     Social History: Reviewed social history from progress note on 02/16/2019. Social History   Socioeconomic History   Marital status: Married    Spouse name: [name]   Number of children: 1   Years of education: Not on file   Highest education level: Associate degree: occupational, Hotel manager, or vocational program  Occupational  History   Occupation: Disabled  Tobacco Use   Smoking status: Every Day    Years: 24.00    Types: Cigarettes    Start date: 03/19/2004   Smokeless tobacco: Never   Tobacco comments:    2-3 cig per day since last month - reported 01/01/2021  Vaping Use   Vaping Use: Never used  Substance and Sexual Activity   Alcohol use: Yes    Alcohol/week: 0.0 standard drinks    Comment: occasional- rare   Drug use: No   Sexual activity: Not Currently  Other Topics Concern   Not on file  Social History Narrative   Live in private residence w/ husband and their friend who is wheelchair bound, her end  of the house is wheelchair accessible.    Social Determinants of Health   Financial Resource Strain: Not on file  Food Insecurity: Not on file  Transportation Needs: Not on file  Physical Activity: Not on file  Stress: Not on file  Social Connections: Not on file    Allergies:  Allergies  Allergen Reactions   Clindamycin/Lincomycin     Pt states caused C-Diff   Montelukast Hives and Rash    Asthma attacks Asthma attacks   Primidone     Diffused erythematous and itchy rash   Sulfasalazine Hives    Other reaction(s): Unknown   Metformin And Related Diarrhea   Benadryl [Diphenhydramine Hcl] Swelling    blistering   Canagliflozin Other (See Comments)    Other reaction(s): Other (See Comments) Low bp Low bp Low bp   Depakote Er [Divalproex Sodium Er] Other (See Comments)    seizures   Dilaudid [Hydromorphone Hcl] Other (See Comments)    seizures   Diphenhydramine-Zinc Acetate Other (See Comments)   Erythromycin    Ethanol    Haloperidol Other (See Comments)   Hydromorphone Other (See Comments)   Neosporin [Neomycin-Bacitracin Zn-Polymyx] Swelling    blistering   Singulair [Montelukast Sodium] Other (See Comments)    Asthma attacks   Sitagliptin Other (See Comments)   Statins Other (See Comments)    Other reaction(s): Unknown   Sulfa Antibiotics Hives    Other reaction(s): Unknown   Victoza [Liraglutide] Other (See Comments) and Nausea And Vomiting    Other reaction(s): Other (See Comments) pancreatitis pancreatitis    Metabolic Disorder Labs: Lab Results  Component Value Date   HGBA1C 8.3 (H) 09/21/2019   MPG 192 09/21/2019   MPG 214 06/16/2019   No results found for: PROLACTIN Lab Results  Component Value Date   CHOL 132 12/13/2020   TRIG 190 (H) 12/13/2020   HDL 52 12/13/2020   CHOLHDL 2.5 12/13/2020   VLDL 38 12/13/2020   LDLCALC 42 12/13/2020   LDLCALC 24 09/21/2019   Lab Results  Component Value Date   TSH 1.671 02/16/2019   TSH 2.160  08/20/2015    Therapeutic Level Labs: No results found for: LITHIUM No results found for: VALPROATE No components found for:  CBMZ  Current Medications: Current Outpatient Medications  Medication Sig Dispense Refill   aspirin EC 81 MG tablet Take 81 mg by mouth daily.     blood glucose meter kit and supplies Use up to four times daily as directed, Dx E11.65, LON 99 months 1 each 0   calcium carbonate (OSCAL) 1500 (600 Ca) MG TABS tablet Take 1 tablet by mouth daily with breakfast.      clonazePAM (KLONOPIN) 0.5 MG tablet Take 1 tablet (0.5 mg total) by mouth as directed.  Take one tablet once a day as needed up to 2-3 times a week only for severe anxiety attacks 12 tablet 0   Continuous Blood Gluc Receiver (FREESTYLE LIBRE 14 DAY READER) DEVI 1 Device.     Continuous Blood Gluc Sensor (FREESTYLE LIBRE 14 DAY SENSOR) MISC 1 kit.     Evolocumab (REPATHA SURECLICK) 938 MG/ML SOAJ Inject 140 mg into the skin every 14 (fourteen) days. 6 mL 3   FARXIGA 10 MG TABS tablet TAKE 1 TABLET DAILY (HIGHER DOSE) 90 tablet 3   fluconazole (DIFLUCAN) 150 MG tablet PLEASE SEE ATTACHED FOR DETAILED DIRECTIONS     furosemide (LASIX) 20 MG tablet Take 20 mg by mouth daily.     glucose blood (ONE TOUCH ULTRA TEST) test strip USE UP TO FOUR TIMES A DAY AS DIRECTED; Dx 11.51, LON 99 months 300 each 3   HUMALOG KWIKPEN 100 UNIT/ML KwikPen INJECT 20 UNITS UNDER THE SKIN IN THE MORNING AND 35 UNITS WITH LUNCH AND EVENING MEAL (INJECT WITH MEALS) 90 mL 3   Insulin Pen Needle 29G X 12.7MM MISC 1 each by Does not apply route as needed. 100 each 3   pioglitazone (ACTOS) 30 MG tablet TAKE 1 TABLET DAILY 90 tablet 0   propranolol (INNOPRAN XL) 80 MG 24 hr capsule Take by mouth.     propranolol ER (INDERAL LA) 60 MG 24 hr capsule Take by mouth.     topiramate (TOPAMAX) 200 MG tablet Take 200 mg by mouth daily as needed.     TRESIBA FLEXTOUCH 200 UNIT/ML SOPN Inject 120 Units into the skin at bedtime. 36 pen 5    lamoTRIgine (LAMICTAL) 100 MG tablet TAKE ONE AND ONE-HALF TABLETS DAILY (DOSE INCREASE) 135 tablet 1   PARoxetine (PAXIL) 40 MG tablet TAKE 1 TABLET EVERY MORNING 90 tablet 1   No current facility-administered medications for this visit.     Musculoskeletal: Strength & Muscle Tone: within normal limits Gait & Station: normal Patient leans: N/A  Psychiatric Specialty Exam: Review of Systems  Psychiatric/Behavioral:  Negative for agitation, behavioral problems, confusion, decreased concentration, dysphoric mood, hallucinations, self-injury, sleep disturbance and suicidal ideas. The patient is not nervous/anxious and is not hyperactive.   All other systems reviewed and are negative.  Blood pressure (!) 175/77, pulse 75, temperature 98.7 F (37.1 C), temperature source Temporal, weight (!) 311 lb (141.1 kg).Body mass index is 50.97 kg/m.  General Appearance: Casual  Eye Contact:  Good  Speech:  Clear and Coherent  Volume:  Normal  Mood:  Euthymic  Affect:  Congruent  Thought Process:  Goal Directed and Descriptions of Associations: Intact  Orientation:  Full (Time, Place, and Person)  Thought Content: Logical   Suicidal Thoughts:  No  Homicidal Thoughts:  No  Memory:  Immediate;   Fair Recent;   Fair Remote;   Fair  Judgement:  Fair  Insight:  Fair  Psychomotor Activity:  Normal  Concentration:  Concentration: Fair and Attention Span: Fair  Recall:  [employer] of Knowledge: Fair  Language: Fair  Akathisia:  No  Handed:  Right  AIMS (if indicated): done, 0  Assets:  Communication Skills Desire for Improvement Housing Intimacy Social Support Talents/Skills  ADL's:  Intact  Cognition: WNL  Sleep:  Fair   Screenings: GAD-7    Flowsheet Row Video Visit from 05/09/2021 in [place]  Total GAD-7 Score [place] Visit from 09/21/2016 in [facility]  Total Score (max 30 points ) 27       PHQ2-9    [place] Office Visit from 11/11/2021 in [place] Video Visit from 05/09/2021 in [place] Office Visit from 02/16/2020 in [facility] Video Visit from 12/06/2019 in [facility] Office Visit from 09/21/2019 in [facility]  PHQ-2 Total Score _0 0 0  PHQ-9 Total Score 6 -- 11 0 0      [place] Office Visit from 11/11/2021 in [place] No Risk        Assessment and Plan: Debra [name] is a 60 year old Caucasian female, married, disabled, lives in [place], has a history of bipolar disorder, PTSD, diabetes, OSA on CPAP, chronic pain, multiple medical problems was evaluated in office today.  Patient is currently stable.  Plan Bipolar disorder in remission Lamictal 150 mg p.o. daily.  We will consider reducing the dosage in future now that she is on Topamax.  She would like to wait. Paxil 40 mg p.o. daily Klonopin 0.5 mg as needed for severe anxiety attacks, she rarely takes it.  PTSD-stable Paxil 40 mg p.o. daily  Insomnia-stable Melatonin 10 mg manage nightly CPAP for OSA  Tobacco use disorder-improving Provided counseling for 5 minutes.  Elevated blood pressure reading-unstable Patient advised to keep a log of her blood pressure at home, she will follow up with primary care provider. Patient provided education.  She reports she is compliant on her medications.  Follow-up in clinic in 3 months or sooner if needed.  This note was generated in part or whole with voice recognition software. Voice recognition is usually quite accurate but there are transcription errors that can and very often do occur. I apologize for any typographical errors that were not detected and corrected.       Ursula Alert, MD 11/12/2021, 8:28 AM

## 2022-02-02 ENCOUNTER — Emergency Department: Payer: Medicare Other

## 2022-02-02 ENCOUNTER — Emergency Department
Admission: EM | Admit: 2022-02-02 | Discharge: 2022-02-02 | Disposition: A | Discharge status: Home / Self Care | Payer: Medicare Other | Attending: Emergency Medicine | Admitting: Emergency Medicine

## 2022-02-02 ENCOUNTER — Encounter: Payer: Self-pay | Admitting: Emergency Medicine

## 2022-02-02 ENCOUNTER — Other Ambulatory Visit: Payer: Self-pay

## 2022-02-02 DIAGNOSIS — I10 Essential (primary) hypertension: Secondary | ICD-10-CM | POA: Diagnosis not present

## 2022-02-02 DIAGNOSIS — S80212A Abrasion, left knee, initial encounter: Secondary | ICD-10-CM | POA: Diagnosis not present

## 2022-02-02 DIAGNOSIS — T148XXA Other injury of unspecified body region, initial encounter: Secondary | ICD-10-CM | POA: Diagnosis not present

## 2022-02-02 DIAGNOSIS — S4991XA Unspecified injury of right shoulder and upper arm, initial encounter: Secondary | ICD-10-CM | POA: Diagnosis present

## 2022-02-02 DIAGNOSIS — S52121A Displaced fracture of head of right radius, initial encounter for closed fracture: Secondary | ICD-10-CM | POA: Diagnosis not present

## 2022-02-02 DIAGNOSIS — M25521 Pain in right elbow: Secondary | ICD-10-CM | POA: Insufficient documentation

## 2022-02-02 DIAGNOSIS — T07XXXA Unspecified multiple injuries, initial encounter: Secondary | ICD-10-CM

## 2022-02-02 DIAGNOSIS — W172XXA Fall into hole, initial encounter: Secondary | ICD-10-CM | POA: Diagnosis not present

## 2022-02-02 DIAGNOSIS — E119 Type 2 diabetes mellitus without complications: Secondary | ICD-10-CM | POA: Diagnosis not present

## 2022-02-02 DIAGNOSIS — Y9302 Activity, running: Secondary | ICD-10-CM | POA: Diagnosis not present

## 2022-02-02 MED ORDER — HYDROCODONE-ACETAMINOPHEN 5-325 MG PO TABS
1.0000 | ORAL_TABLET | Freq: Once | ORAL | Status: AC
  Administered 2022-02-02: 1 via ORAL
  Filled 2022-02-02: qty 1

## 2022-02-02 MED ORDER — IBUPROFEN 400 MG PO TABS
400.0000 mg | ORAL_TABLET | Freq: Once | ORAL | Status: AC
  Administered 2022-02-02: 400 mg via ORAL
  Filled 2022-02-02: qty 1

## 2022-02-02 MED ORDER — OXYCODONE-ACETAMINOPHEN 5-325 MG PO TABS: 1.0000 | ORAL_TABLET | Freq: Three times a day (TID) | ORAL | 0 refills | Status: AC | PRN

## 2022-02-02 NOTE — ED Provider Notes (Signed)
[facility] [address] Provider Note    Event Date/Time   First MD Initiated Contact with Patient 02/02/22 1653     (approximate)   History   Fall   HPI  Debra [name] is a 61 y.o. female with a past medical history of obesity, DM, depression, arthritis, anxiety, HTN, HDL and Parkinson's as well as seizure disorder who presents for evaluation of some right shoulder, right elbow, right wrist, left elbow, left wrist left knee discomfort after a fall that occurred prior to arrival.  Patient states she was outside running when she stepped into a hole falling onto the left knee.  Braced her self with both hands.  She thinks her head may have grazed the ground and that her glasses came off but does not think she hit her head very hard and has no current headache, neck pain, back pain, chest pain, abdominal pain or any other extremity pain.  No other recent falls or injuries.  No other recent sick symptoms.  She is not on any anticoagulation.    Past Medical History:  Diagnosis Date   Anxiety    Arthritis    C. difficile colitis    C. difficile colitis    Depression    Diabetes mellitus without complication ([place])    Type II   Diabetes mellitus, type II (HCC)    Family history of adverse reaction to anesthesia    Mother - BP drops   Head injury, closed, with brief LOC ([place]) 2011ish   Hyperlipidemia    Hypertension    Insomnia    Myalgia 03/29/2018   Due to statin   Obesity    Parkinson's disease ([place])    Seasonal allergies    Seizures ([place])    Tobacco abuse      Physical Exam  Triage Vital Signs: ED Triage Vitals  Enc Vitals Group     BP 02/02/22 1526 (!) 153/104     Pulse Rate 02/02/22 1526 66     Resp 02/02/22 1526 18     Temp 02/02/22 1526 98.4 F (36.9 C)     Temp Source 02/02/22 1526 Oral     SpO2 02/02/22 1526 98 %     Weight 02/02/22 1525 295 lb (133.8 kg)     Height 02/02/22 1525 5\' 5"  (1.651 m)     Head Circumference --      Peak Flow --       Pain Score 02/02/22 1525 10     Pain Loc --      Pain Edu? --      Excl. in [place]? --     Most recent vital signs: Vitals:   02/02/22 1526  BP: (!) 153/104  Pulse: 66  Resp: 18  Temp: 98.4 F (36.9 C)  SpO2: 98%    General: Awake, no distress.  CV:  Good peripheral perfusion.  2+ radial and PT pulses. Resp:  Normal effort.  Clear bilaterally. Abd:  No distention.  Soft throughout. Other:  Cranial nerves II through XII are grossly intact.  No obvious trauma to the face scalp head or neck.  There is no tenderness over the C/T/L-spine.  Patient is a little sore over the entrapment of the right shoulder and circumferentially about the right elbow and over the dorsum of the right wrist but there is no specific snuffbox tenderness in the right hand.  She is able to grip this examiner in the right hand.  Sensation is intact in the  distribution of the axillary radial and ulnar and median nerves.  There is no deformity noted in either upper extremity.  She is a little sore on range of motion at the left elbow and wrist but otherwise there is no deformity or areas of point tenderness.  No snuffbox tenderness in the left hand.  Sensation is intact in the distribution of the radial ulnar and median nerves in the left hand.  There is an abrasion over the left knee but otherwise patient has full strength and range of motion throughout the bilateral lower extremities.   ED Results / Procedures / Treatments  Labs (all labs ordered are listed, but only abnormal results are displayed) Labs Reviewed - No data to display   EKG    RADIOLOGY  X-ray of the right shoulder interpreted by myself without evidence of fracture or dislocation.  I also reviewed radiologist interpretation and agree with the findings  X-ray of the right elbow reviewed by myself shows very minimally displaced radial head fracture without any humerus or ulnar fractures visible.  I also reviewed radiologist interpretation and agree  with the findings seen.  X-ray of the right wrist interpreted by myself without evidence of fracture or dislocation.  Notes reviewed radiology interpretation and agree with same.  X-ray of the left elbow interpreted myself without evidence of fracture or dislocation.  Also reviewed radiologist interpretation and agree with the findings of same.  X-ray of the left wrist reviewed by myself without evidence of fracture or dislocation.  As reviewed radiology interpretation and agree with same.  PROCEDURES:  Critical Care performed: No  Procedures   MEDICATIONS ORDERED IN ED: Medications  HYDROcodone-acetaminophen (NORCO/VICODIN) 5-325 MG per tablet 1 tablet (1 tablet Oral Given 02/02/22 1529)  ibuprofen (ADVIL) tablet 400 mg (400 mg Oral Given 02/02/22 1711)     IMPRESSION / MDM / ASSESSMENT AND PLAN / ED COURSE  I reviewed the triage vital signs and the nursing notes.                              Differential diagnosis includes, but is not limited to contusion and possible muscle strain of the bilateral upper extremities versus possible underlying fracture.  Very low suspicion for fracture and left knee and exam is most consistent with an abrasion there.  Low suspicion for seizure or syncope as patient declines a clear mechanical mechanism for tripping.  She is otherwise neurovascular intact.  X-ray of the right shoulder interpreted by myself without evidence of fracture or dislocation.  I also reviewed radiologist interpretation and agree with the findings  X-ray of the right elbow reviewed by myself shows very minimally displaced radial head fracture without any humerus or ulnar fractures visible.  I also reviewed radiologist interpretation and agree with the findings seen.  X-ray of the right wrist interpreted by myself without evidence of fracture or dislocation.  Notes reviewed radiology interpretation and agree with same.  X-ray of the left elbow interpreted myself without  evidence of fracture or dislocation.  Also reviewed radiologist interpretation and agree with the findings of same.  X-ray of the left wrist reviewed by myself without evidence of fracture or dislocation.  As reviewed radiology interpretation and agree with same.  We will place the right upper extremity in a posterior long-arm splint.  Analgesia provided.  She is otherwise neurovascular and I do not believe degree of displacement at this time requires reduction or the degree  is amenable to reduction in the emergency room.  Discussed with on-call orthopedist Dr. Vevelyn Pat was amenable with plan.  Discharged in stable condition.  Strict return precautions advised and discussed.  Patient will follow-up with orthopedic service.  Advised to have blood pressure rechecked by PCP.     FINAL CLINICAL IMPRESSION(S) / ED DIAGNOSES   Final diagnoses:  Closed displaced fracture of head of right radius, initial encounter  Abrasion of left knee, initial encounter  Multiple contusions  Hypertension, unspecified type     Rx / DC Orders   ED Discharge Orders          Ordered    oxyCODONE-acetaminophen (PERCOCET) 5-325 MG tablet  Every 8 hours PRN        02/02/22 1707             Note:  This document was prepared using Dragon voice recognition software and may include unintentional dictation errors.   Lucrezia Starch, MD 02/02/22 [phone]

## 2022-02-02 NOTE — ED Triage Notes (Signed)
Pt via POV from home. Pt fell forward and tried to brace her fall. Pt c/o R shoulder, elbow, wrist and L elbow and wrist pain. Denies LOC. Denies head injury. Pt is A&Ox4 and NAD.

## 2022-02-02 NOTE — ED Notes (Signed)
See triage note  presents s/p fall  states fell face first   having pain to right shoulder,both wrists and both elbows

## 2022-02-02 NOTE — ED Provider Triage Note (Signed)
°  Emergency Medicine Provider Triage Evaluation Note  Debra [name] , a 61 y.o.female,  was evaluated in triage.  Pt complains of injuries from fall.  Patient states that she suffered from a mechanical fall where she fell forward and try to catch herself with her arms.  Currently endorsing pain in her right wrist, elbow, and shoulder, as well as her left wrist and left elbow.   Review of Systems  Positive: Wrist pain, elbow pain, shoulder pain Negative: Denies fever, chest pain, vomiting  Physical Exam   Vitals:   02/02/22 1526  BP: (!) 153/104  Pulse: 66  Resp: 18  Temp: 98.4 F (36.9 C)  SpO2: 98%   Gen:   Awake, no distress   Resp:  Normal effort  MSK:   Moves extremities without difficulty  Other:  Significant tenderness when palpating wrist, elbow, and shoulder on the right side.  Tenderness with palpation of the wrist and elbow on the left side  Medical Decision Making  Given the patient's initial medical screening exam, the following diagnostic evaluation has been ordered. The patient will be placed in the appropriate treatment space, once one is available, to complete the evaluation and treatment. I have discussed the plan of care with the patient and I have advised the patient that an ED physician or mid-level practitioner will reevaluate their condition after the test results have been received, as the results may give them additional insight into the type of treatment they may need.    Diagnostics: X-rays.  Treatments: Hydrocodone   Varney Daily, [place] 02/02/22 1527

## 2022-02-10 ENCOUNTER — Telehealth: Payer: Self-pay | Admitting: Cardiovascular Disease

## 2022-02-10 ENCOUNTER — Telehealth: Payer: Self-pay | Admitting: *Deleted

## 2022-02-10 DIAGNOSIS — Z79899 Other long term (current) drug therapy: Secondary | ICD-10-CM

## 2022-02-10 NOTE — Telephone Encounter (Signed)
?*  STAT* If patient is at the pharmacy, call can be transferred to refill team. ? ? ?1. Which medications need to be refilled? (please list name of each medication and dose if known) Repatha 140mg   ? ?2. Which pharmacy/location (including street and city if local pharmacy) is medication to be sent to? [facility] ? ?3. Do they need a 30 day or 90 day supply? 30 day ?

## 2022-02-10 NOTE — Telephone Encounter (Signed)
Please contact pt for future appointment. Pt overdue for 12 month f/u. Pt needing refills. 

## 2022-02-10 NOTE — Telephone Encounter (Signed)
PA required for Repatha 140 mg/ml inj. ?PA submitted via covermymeds. ?[employer] is reviewing your PA request. Typically an electronic response will be received within 24-72 hours. To check for an update later, open this request from your dashboard. ? ?You may close this dialog and return to your dashboard to perform other tasks. ?

## 2022-02-10 NOTE — Telephone Encounter (Signed)
Scheduled

## 2022-02-10 NOTE — Telephone Encounter (Signed)
Attempted to schedule.  

## 2022-02-11 MED ORDER — REPATHA SURECLICK 140 MG/ML ~~LOC~~ SOAJ: 140.0000 mg | SUBCUTANEOUS | 0 refills | Status: DC

## 2022-02-11 NOTE — Telephone Encounter (Signed)
Message from Plan ? ?We are unable to process your request for prior authorization for REPATHA SURE INJ 140MG /ML for the above member due to [employer] has a denied request on file for REPATHA SURE INJ 140MG /ML for this member. Please refer to the appeals process outlined in the original denial or contact Prior Authorization Department at [phone] for further questions. ?

## 2022-02-11 NOTE — Telephone Encounter (Signed)
Received denial letter  ?Faxed to [employer], CMA ?

## 2022-02-12 NOTE — Telephone Encounter (Signed)
Called and attempted to do appeal via phone but they would not allow me to do so. I requested the original denial faxed to me so that I can fax back clinical documentation, chart notes, labs. Speaking of lab work... this will be unable to be renewed without updated labs. I will route to amanda fox rn to obtain lab orders from dr. Rockey Situ. We must prove ldl remained 40% decreased from baseline.  ?

## 2022-02-13 NOTE — Addendum Note (Signed)
Addended by: Maple Hudson on: 02/13/2022 07:51 AM ? ? Modules accepted: Orders ? ?

## 2022-02-13 NOTE — Telephone Encounter (Signed)
Lipid order placed, pt needs to schedule an appt to come in to have drawn for her PA to be renewed for Repatha.  ? ?Pt is overdue for her 12 month f/u ? ?Pt schedule for April 7, must keep that appt in order for her application to be process and future refills ? ?Appeal form in PA orange folder on cart, will fax to [employer] once lipids resulted.  ?

## 2022-02-16 ENCOUNTER — Ambulatory Visit: Payer: Medicare Other | Admitting: Cardiovascular Disease

## 2022-02-17 ENCOUNTER — Telehealth (INDEPENDENT_AMBULATORY_CARE_PROVIDER_SITE_OTHER): Payer: Medicare Other | Admitting: Psychiatry

## 2022-02-17 ENCOUNTER — Encounter: Payer: Self-pay | Admitting: Psychiatry

## 2022-02-17 ENCOUNTER — Other Ambulatory Visit: Payer: Self-pay

## 2022-02-17 DIAGNOSIS — F4310 Post-traumatic stress disorder, unspecified: Secondary | ICD-10-CM

## 2022-02-17 DIAGNOSIS — F3132 Bipolar disorder, current episode depressed, moderate: Secondary | ICD-10-CM | POA: Diagnosis not present

## 2022-02-17 DIAGNOSIS — F17200 Nicotine dependence, unspecified, uncomplicated: Secondary | ICD-10-CM

## 2022-02-17 DIAGNOSIS — Z634 Disappearance and death of family member: Secondary | ICD-10-CM

## 2022-02-17 MED ORDER — LAMOTRIGINE 25 MG PO TABS: 25.0000 mg | ORAL_TABLET | Freq: Every day | ORAL | 0 refills | Status: DC

## 2022-02-17 NOTE — Progress Notes (Addendum)
Virtual Visit via Video Note ? ?I connected with Debra [name] on 02/17/22 at  1:00 PM EDT by a video enabled telemedicine application and verified that I am speaking with the correct person using two identifiers. ? ?Location ?Provider Location : [facility] ?Patient Location : Home ? ?Participants: Patient , Provider ? ?  ?I discussed the limitations of evaluation and management by telemedicine and the availability of in person appointments. The patient expressed understanding and agreed to proceed. ? ?  ?I discussed the assessment and treatment plan with the patient. The patient was provided an opportunity to ask questions and all were answered. The patient agreed with the plan and demonstrated an understanding of the instructions. ?  ?The patient was advised to call back or seek an in-person evaluation if the symptoms worsen or if the condition fails to improve as anticipated. ? ? ?[facility] MD OP Progress Note ? ?02/17/2022 1:12 PM ?Debra [name]  ?MRN:  [id number] ? ?Chief Complaint:  ?Chief Complaint  ?Patient presents with  ? Follow-up: 61 year old Caucasian female with history of bipolar disorder, PTSD, tobacco use disorder, presented for medication management.  ? ?HPI: Debra [name] is a 61 year old Caucasian female, married, disabled, lives in [place], has a history of bipolar disorder, PTSD, tobacco use disorder, parkinsonian tremors, chronic back pain, OSA, hypertension was evaluated by telemedicine today. ? ?Patient today reports she is currently grieving the loss of her mother who passed away beginning of 01/22/23.  Patient became tearful when she discussed this.  She reports she continues to have days when she has crying spells.  She reports she is currently following up with hospice therapist who provides her counseling twice a week.  That has been helpful. ? ?Patient does report feeling sad often, crying spells, low energy, anhedonia, reduced appetite.  Patient reports she also fell recently ( slipped in to a hole in her  yard, did not see it )  and has an arm fracture currently in a cast (right-sided fracture of the radial head) , currently following up with physical therapist.  That also limits her ability to move around and do the things that she normally does.  She needs support with activities of daily living.  She reports her daughter is currently here with her and she and her husband helps her out. ? ?Patient is compliant on her medications.  Denies side effects. ? ?Patient denies any suicidality, homicidality or perceptual disturbances. ? ?Patient denies any other concerns today. ? ?Visit Diagnosis:  ?  ICD-10-CM   ?1. Bipolar 1 disorder, depressed, moderate (HCC)  F31.32 lamoTRIgine (LAMICTAL) 25 MG tablet  ?  ?2. PTSD (post-traumatic stress disorder)  F43.10 lamoTRIgine (LAMICTAL) 25 MG tablet  ?  ?3. Tobacco use disorder  F17.200   ?  ?4. Bereavement  Z63.4 lamoTRIgine (LAMICTAL) 25 MG tablet  ?  ? ? ?Past Psychiatric History: Reviewed past psychiatric history from progress note on 02/16/2019 ? ?Past Medical History:  ?Past Medical History:  ?Diagnosis Date  ? Anxiety   ? Arthritis   ? C. difficile colitis   ? C. difficile colitis   ? Depression   ? Diabetes mellitus without complication ([place])   ? Type II  ? Diabetes mellitus, type II ([place])   ? Family history of adverse reaction to anesthesia   ? Mother - BP drops  ? Head injury, closed, with brief LOC ([place]) 2011ish  ? Hyperlipidemia   ? Hypertension   ? Insomnia   ? Myalgia 03/29/2018  ?  Due to statin  ? Obesity   ? Parkinson's disease ([place])   ? Seasonal allergies   ? Seizures ([place])   ? Tobacco abuse   ?  ?Past Surgical History:  ?Procedure Laterality Date  ? ABDOMINAL HYSTERECTOMY  2012  ? BREAST BIOPSY Right   ? [place]  ? CHOLECYSTECTOMY  2013  ? LUMBAR LAMINECTOMY/DECOMPRESSION MICRODISCECTOMY Left 09/10/2015  ? Procedure: Left L4-5 Foraminotomy/Left L5-S1 Diskectomy;  Surgeon: Leeroy Cha, MD;  Location: [facility];  Service: Neurosurgery;   Laterality: Left;  Left L4-5 Foraminotomy/Left L5-S1 Diskectomy  ? ? ?Family Psychiatric History: Reviewed family psychiatric history from progress note on 02/16/2019 ? ?Family History:  ?Family History  ?Problem Relation Age of Onset  ? COPD Mother   ? Bipolar disorder Father   ? Diabetes Mellitus II Father   ? Bipolar disorder Brother   ? Diabetes Mellitus II Brother   ? Bipolar disorder Brother   ? ? ?Social History: Reviewed social history from progress note on 02/16/2019 ?Social History  ? ?Socioeconomic History  ? Marital status: Married  ?  Spouse name: [name]  ? Number of children: 1  ? Years of education: Not on file  ? Highest education level: Associate degree: occupational, Hotel manager, or vocational program  ?Occupational History  ? Occupation: Disabled  ?Tobacco Use  ? Smoking status: Every Day  ?  Years: 24.00  ?  Types: Cigarettes  ?  Start date: 03/19/2004  ? Smokeless tobacco: Never  ? Tobacco comments:  ?  2-3 cig per day since last month - reported 01/01/2021  ?Vaping Use  ? Vaping Use: Never used  ?Substance and Sexual Activity  ? Alcohol use: Yes  ?  Alcohol/week: 0.0 standard drinks  ?  Comment: occasional- rare  ? Drug use: No  ? Sexual activity: Not Currently  ?Other Topics Concern  ? Not on file  ?Social History Narrative  ? Live in private residence w/ husband and their friend who is wheelchair bound, her end of the house is wheelchair accessible.   ? ?Social Determinants of Health  ? ?Financial Resource Strain: Not on file  ?Food Insecurity: Not on file  ?Transportation Needs: Not on file  ?Physical Activity: Not on file  ?Stress: Not on file  ?Social Connections: Not on file  ? ? ?Allergies:  ?Allergies  ?Allergen Reactions  ? Clindamycin/Lincomycin   ?  Pt states caused C-Diff  ? Montelukast Hives and Rash  ?  Asthma attacks ?Asthma attacks  ? Primidone   ?  Diffused erythematous and itchy rash  ? Sulfasalazine Hives  ?  Other reaction(s): Unknown  ? Metformin And Related Diarrhea  ? Benadryl  [Diphenhydramine Hcl] Swelling  ?  blistering  ? Canagliflozin Other (See Comments)  ?  Other reaction(s): Other (See Comments) ?Low bp ?Low bp ?Low bp  ? Depakote Er [Divalproex Sodium Er] Other (See Comments)  ?  seizures  ? Dilaudid [Hydromorphone Hcl] Other (See Comments)  ?  seizures  ? Diphenhydramine-Zinc Acetate Other (See Comments)  ? Erythromycin   ? Ethanol   ? Haloperidol Other (See Comments)  ? Hydromorphone Other (See Comments)  ? Neosporin [Neomycin-Bacitracin Zn-Polymyx] Swelling  ?  blistering  ? Singulair [Montelukast Sodium] Other (See Comments)  ?  Asthma attacks  ? Sitagliptin Other (See Comments)  ? Statins Other (See Comments)  ?  Other reaction(s): Unknown  ? Sulfa Antibiotics Hives  ?  Other reaction(s): Unknown  ? Victoza [Liraglutide] Other (See Comments)  and Nausea And Vomiting  ?  Other reaction(s): Other (See Comments) ?pancreatitis ?pancreatitis  ? ? ?Metabolic Disorder Labs: ?Lab Results  ?Component Value Date  ? HGBA1C 8.3 (H) 09/21/2019  ? MPG 192 09/21/2019  ? MPG 214 06/16/2019  ? ?No results found for: PROLACTIN ?Lab Results  ?Component Value Date  ? CHOL 132 12/13/2020  ? TRIG 190 (H) 12/13/2020  ? HDL 52 12/13/2020  ? CHOLHDL 2.5 12/13/2020  ? VLDL 38 12/13/2020  ? [place] 42 12/13/2020  ? [place] 24 09/21/2019  ? ?Lab Results  ?Component Value Date  ? TSH 1.671 02/16/2019  ? TSH 2.160 08/20/2015  ? ? ?Therapeutic Level Labs: ?No results found for: LITHIUM ?No results found for: VALPROATE ?No components found for:  CBMZ ? ?Current Medications: ?Current Outpatient Medications  ?Medication Sig Dispense Refill  ? lamoTRIgine (LAMICTAL) 25 MG tablet Take 1 tablet (25 mg total) by mouth daily. Take 1 tablet daily with 150 mg . 90 tablet 0  ? aspirin EC 81 MG tablet Take 81 mg by mouth daily.    ? blood glucose meter kit and supplies Use up to four times daily as directed, Dx E11.65, LON 99 months 1 each 0  ? calcium carbonate (OSCAL) 1500 (600 Ca) MG TABS tablet Take 1 tablet by  mouth daily with breakfast.     ? clonazePAM (KLONOPIN) 0.5 MG tablet Take 1 tablet (0.5 mg total) by mouth as directed. Take one tablet once a day as needed up to 2-3 times a week only for severe anxiety attacks

## 2022-03-12 NOTE — Progress Notes (Signed)
?  ?  ? ? ? ?Date:  03/13/2022  ? ?ID:  Debra [name], DOB [birth year], MRN [id number] ? ?Patient Location:  ?[facility] [place] [id number]  ? ?Provider location:   ?[place], [employer] office ? ?PCP:  System, Provider Not In  ?Cardiologist:  [facility] ? ?  ?Chief Complaint  ?Patient presents with  ? 12 month follow up   ?  "Doing well." Patient fell and broke both wrist in Feb. 2023. Medications reviewed by the patient verbally.   ? ? ?History of Present Illness:   ? ?Debra [name] is a 61 y.o. female  past medical history of ?Smoker  ?Aortic athero on CT ?Poorly controlled diabetes ?Morbid obesity ?Hypertension ?Parkinson's with tremor ?Who presents for follow-up of her uncontrolled diabetes, atherosclerosis, chest heaviness ? ?Last seen in clinic by myself, telemetry visit May 2020 ?Seen by one of our providers January 2022 ? ?A1C "6.9 " she reports , down from >8 ? ?On propanolol for tremor, symptoms better on propranolol  ?Tolerating PCSK9 inhibitor ? ?Labs from 08/2021 ?Total chol 113, ldl 42 ?History of statin intolerance, she is on PCSK9 inhibitor, well-tolerated ? ?Reports still smoking a little bit but much less in general ?On last clinic visit was smoking 5 cigarettes/day ?On CPAP ? ?EKG personally reviewed by myself on todays visit ?Normal sinus rhythm RATE 63 bpm, no significant ST-T wave changes ? ?Other past medical history reviewed ?Cardiac CT ordered on January 2022 visit ?Calcium score 171 ?Image quality limited secondary to morbid obesity ?Nonobstructive disease noted ?Fatty liver noted ? ? CT scan from March 2019 ?At least mild, some case mild to moderate calcification noted in the abdominal aorta and branches/iliac vessels ? ? ?Past Medical History:  ?Diagnosis Date  ? Anxiety   ? Arthritis   ? C. difficile colitis   ? C. difficile colitis   ? Depression   ? Diabetes mellitus without complication ([place])   ? Type II  ? Diabetes mellitus, type II ([place])   ? Family history of  adverse reaction to anesthesia   ? Mother - BP drops  ? Head injury, closed, with brief LOC ([place]) 2011ish  ? Hyperlipidemia   ? Hypertension   ? Insomnia   ? Myalgia 03/29/2018  ? Due to statin  ? Obesity   ? Parkinson's disease ([place])   ? Seasonal allergies   ? Seizures ([place])   ? Tobacco abuse   ? ?Past Surgical History:  ?Procedure Laterality Date  ? ABDOMINAL HYSTERECTOMY  2012  ? BREAST BIOPSY Right   ? [place]  ? CHOLECYSTECTOMY  2013  ? LUMBAR LAMINECTOMY/DECOMPRESSION MICRODISCECTOMY Left 09/10/2015  ? Procedure: Left L4-5 Foraminotomy/Left L5-S1 Diskectomy;  Surgeon: Leeroy Cha, MD;  Location: [facility];  Service: Neurosurgery;  Laterality: Left;  Left L4-5 Foraminotomy/Left L5-S1 Diskectomy  ?  ? ?Current Meds  ?Medication Sig  ? aspirin EC 81 MG tablet Take 81 mg by mouth daily.  ? blood glucose meter kit and supplies Use up to four times daily as directed, Dx E11.65, LON 99 months  ? calcium carbonate (OSCAL) 1500 (600 Ca) MG TABS tablet Take 1 tablet by mouth daily with breakfast.   ? clonazePAM (KLONOPIN) 0.5 MG tablet Take 1 tablet (0.5 mg total) by mouth as directed. Take one tablet once a day as needed up to 2-3 times a week only for severe anxiety attacks  ? Continuous Blood Gluc Receiver (FREESTYLE LIBRE 14 DAY  READER) DEVI 1 Device.  ? Continuous Blood Gluc Sensor (FREESTYLE LIBRE 14 DAY SENSOR) MISC 1 kit.  ? Evolocumab (REPATHA SURECLICK) 818 MG/ML SOAJ Inject 140 mg into the skin every 14 (fourteen) days.  ? FARXIGA 10 MG TABS tablet TAKE 1 TABLET DAILY (HIGHER DOSE)  ? fluconazole (DIFLUCAN) 150 MG tablet PLEASE SEE ATTACHED FOR DETAILED DIRECTIONS  ? furosemide (LASIX) 20 MG tablet Take 20 mg by mouth daily.  ? glucose blood (ONE TOUCH ULTRA TEST) test strip USE UP TO FOUR TIMES A DAY AS DIRECTED; Dx 11.51, LON 99 months  ? HUMALOG KWIKPEN 100 UNIT/ML KwikPen INJECT 20 UNITS UNDER THE SKIN IN THE MORNING AND 35 UNITS WITH LUNCH AND EVENING MEAL (INJECT WITH MEALS)  ?  Insulin Pen Needle 29G X 12.7MM MISC 1 each by Does not apply route as needed.  ? lamoTRIgine (LAMICTAL) 100 MG tablet TAKE ONE AND ONE-HALF TABLETS DAILY (DOSE INCREASE)  ? lamoTRIgine (LAMICTAL) 25 MG tablet Take 1 tablet (25 mg total) by mouth daily. Take 1 tablet daily with 150 mg .  ? PARoxetine (PAXIL) 40 MG tablet TAKE 1 TABLET EVERY MORNING  ? pioglitazone (ACTOS) 30 MG tablet TAKE 1 TABLET DAILY  ? pregabalin (LYRICA) 25 MG capsule Take 25 mg by mouth 3 (three) times daily.  ? propranolol (INNOPRAN XL) 80 MG 24 hr capsule Take 80 mg by mouth daily.  ? propranolol ER (INDERAL LA) 60 MG 24 hr capsule Take 60 mg by mouth daily.  ? topiramate (TOPAMAX) 200 MG tablet Take 200 mg by mouth daily as needed.  ? TRESIBA FLEXTOUCH 200 UNIT/ML SOPN Inject 120 Units into the skin at bedtime.  ? [DISCONTINUED] spironolactone (ALDACTONE) 25 MG tablet spironolactone 25 mg tablet ? TAKE 1/2 TABLET BY MOUTH EVERY DAY  ?  ? ?Allergies:   Clindamycin/lincomycin, Montelukast, Primidone, Sulfasalazine, Metformin and related, Benadryl [diphenhydramine hcl], Canagliflozin, Depakote er [divalproex sodium er], Dilaudid [hydromorphone hcl], Diphenhydramine-zinc acetate, Erythromycin, Ethanol, Haloperidol, Hydromorphone, Neosporin [neomycin-bacitracin zn-polymyx], Singulair [montelukast sodium], Sitagliptin, Statins, Sulfa antibiotics, and Victoza [liraglutide]  ? ?Social History  ? ?Tobacco Use  ? Smoking status: Every Day  ?  Packs/day: 0.50  ?  Years: 24.00  ?  Pack years: 12.00  ?  Types: Cigarettes  ?  Start date: 03/19/2004  ? Smokeless tobacco: Never  ? Tobacco comments:  ?  2-3 cig per day since last month - reported 01/01/2021  ?Vaping Use  ? Vaping Use: Never used  ?Substance Use Topics  ? Alcohol use: Yes  ?  Alcohol/week: 0.0 standard drinks  ?  Comment: occasional- rare  ? Drug use: No  ?  ? ?Current Outpatient Medications on File Prior to Visit  ?Medication Sig Dispense Refill  ? aspirin EC 81 MG tablet Take 81 mg by mouth  daily.    ? blood glucose meter kit and supplies Use up to four times daily as directed, Dx E11.65, LON 99 months 1 each 0  ? calcium carbonate (OSCAL) 1500 (600 Ca) MG TABS tablet Take 1 tablet by mouth daily with breakfast.     ? clonazePAM (KLONOPIN) 0.5 MG tablet Take 1 tablet (0.5 mg total) by mouth as directed. Take one tablet once a day as needed up to 2-3 times a week only for severe anxiety attacks 12 tablet 0  ? Continuous Blood Gluc Receiver (FREESTYLE LIBRE 14 DAY READER) DEVI 1 Device.    ? Continuous Blood Gluc Sensor (FREESTYLE LIBRE 14 DAY SENSOR) MISC 1 kit.    ?  Evolocumab (REPATHA SURECLICK) 444 MG/ML SOAJ Inject 140 mg into the skin every 14 (fourteen) days. 6 mL 0  ? FARXIGA 10 MG TABS tablet TAKE 1 TABLET DAILY (HIGHER DOSE) 90 tablet 3  ? fluconazole (DIFLUCAN) 150 MG tablet PLEASE SEE ATTACHED FOR DETAILED DIRECTIONS    ? furosemide (LASIX) 20 MG tablet Take 20 mg by mouth daily.    ? glucose blood (ONE TOUCH ULTRA TEST) test strip USE UP TO FOUR TIMES A DAY AS DIRECTED; Dx 11.51, LON 99 months 300 each 3  ? HUMALOG KWIKPEN 100 UNIT/ML KwikPen INJECT 20 UNITS UNDER THE SKIN IN THE MORNING AND 35 UNITS WITH LUNCH AND EVENING MEAL (INJECT WITH MEALS) 90 mL 3  ? Insulin Pen Needle 29G X 12.7MM MISC 1 each by Does not apply route as needed. 100 each 3  ? lamoTRIgine (LAMICTAL) 100 MG tablet TAKE ONE AND ONE-HALF TABLETS DAILY (DOSE INCREASE) 135 tablet 1  ? lamoTRIgine (LAMICTAL) 25 MG tablet Take 1 tablet (25 mg total) by mouth daily. Take 1 tablet daily with 150 mg . 90 tablet 0  ? PARoxetine (PAXIL) 40 MG tablet TAKE 1 TABLET EVERY MORNING 90 tablet 1  ? pioglitazone (ACTOS) 30 MG tablet TAKE 1 TABLET DAILY 90 tablet 0  ? pregabalin (LYRICA) 25 MG capsule Take 25 mg by mouth 3 (three) times daily.    ? propranolol (INNOPRAN XL) 80 MG 24 hr capsule Take 80 mg by mouth daily.    ? propranolol ER (INDERAL LA) 60 MG 24 hr capsule Take 60 mg by mouth daily.    ? topiramate (TOPAMAX) 200 MG tablet  Take 200 mg by mouth daily as needed.    ? TRESIBA FLEXTOUCH 200 UNIT/ML SOPN Inject 120 Units into the skin at bedtime. 36 pen 5  ? ?No current facility-administered medications on file prior to visit.  ?

## 2022-03-13 ENCOUNTER — Encounter: Payer: Self-pay | Admitting: Cardiovascular Disease

## 2022-03-13 ENCOUNTER — Ambulatory Visit: Payer: Medicare Other | Admitting: Cardiovascular Disease

## 2022-03-13 VITALS — BP 110/72 | HR 63 | Ht 65.5 in | Wt 302.1 lb

## 2022-03-13 DIAGNOSIS — E782 Mixed hyperlipidemia: Secondary | ICD-10-CM

## 2022-03-13 DIAGNOSIS — I700 Atherosclerosis of aorta: Secondary | ICD-10-CM | POA: Diagnosis not present

## 2022-03-13 DIAGNOSIS — F17200 Nicotine dependence, unspecified, uncomplicated: Secondary | ICD-10-CM | POA: Diagnosis not present

## 2022-03-13 NOTE — Telephone Encounter (Signed)
Pt seen in office.  ? ?PCP office faxed over lab results with lipid panel included.  ? ?Appeal form with labs faxed to [facility].  ? ?Form placed with original denial letter into filing cabinet in medication room.  ?

## 2022-03-13 NOTE — Patient Instructions (Signed)
Medication Instructions:  No changes  If you need a refill on your cardiac medications before your next appointment, please call your pharmacy.   Lab work: No new labs needed  Testing/Procedures: No new testing needed  Follow-Up: At [facility], you and your health needs are our priority.  As part of our continuing mission to provide you with exceptional heart care, we have created designated Provider Care Teams.  These Care Teams include your primary Cardiologist (physician) and Advanced Practice Providers (APPs -  Physician Assistants and Nurse Practitioners) who all work together to provide you with the care you need, when you need it.  You will need a follow up appointment in 12 months  Providers on your designated Care Team:   Christopher Berge, NP Ryan Dunn, PA-C Cadence Furth, PA-C  COVID-19 Vaccine Information can be found at: [email or web address] For questions related to vaccine distribution or appointments, please email [email or web address] or call [phone].   

## 2022-03-20 ENCOUNTER — Encounter: Payer: Self-pay | Admitting: Psychiatry

## 2022-03-20 ENCOUNTER — Telehealth (INDEPENDENT_AMBULATORY_CARE_PROVIDER_SITE_OTHER): Payer: Medicare Other | Admitting: Psychiatry

## 2022-03-20 DIAGNOSIS — F17200 Nicotine dependence, unspecified, uncomplicated: Secondary | ICD-10-CM

## 2022-03-20 DIAGNOSIS — F4310 Post-traumatic stress disorder, unspecified: Secondary | ICD-10-CM | POA: Diagnosis not present

## 2022-03-20 DIAGNOSIS — F3176 Bipolar disorder, in full remission, most recent episode depressed: Secondary | ICD-10-CM | POA: Diagnosis not present

## 2022-03-20 DIAGNOSIS — Z634 Disappearance and death of family member: Secondary | ICD-10-CM

## 2022-03-20 NOTE — Progress Notes (Signed)
Virtual Visit via Video Note ? ?I connected with Debra [name] on 03/20/22 at 11:20 AM EDT by a video enabled telemedicine application and verified that I am speaking with the correct person using two identifiers. ? ?Location ?Provider Location : [facility] ?Patient Location : Home ? ?Participants: Patient , Provider ?  ?I discussed the limitations of evaluation and management by telemedicine and the availability of in person appointments. The patient expressed understanding and agreed to proceed. ? ?  ?I discussed the assessment and treatment plan with the patient. The patient was provided an opportunity to ask questions and all were answered. The patient agreed with the plan and demonstrated an understanding of the instructions. ?  ?The patient was advised to call back or seek an in-person evaluation if the symptoms worsen or if the condition fails to improve as anticipated. ? ? ?Marcellus MD OP Progress Note ? ?03/20/2022 1:05 PM ?Debra [name]  ?MRN:  [id number] ? ?Chief Complaint:  ?Chief Complaint  ?Patient presents with  ? Follow-up : 61 year old Caucasian female with history of PTSD, bipolar disorder, tobacco use disorder, presented for medication management.  ? ?HPI: Debra [name] is a 61 year old Caucasian female, married, disabled, lives in [place], has a history of bipolar disorder, tobacco use disorder, parkinsonian tremors, chronic back pain, hypertension, was evaluated by telemedicine today. ? ?Patient today reports mood wise she is improving.  She does have good days and bad days however she feels she has more good days than bad days now.  She reports she has a houseful of people right now, her daughter is here to help her.  Her daughter's husband is going to join soon.  She also reports her friend's daughter is going to come and stay with her for the next 3 months since her house is being renovated.  That does kind of make her overwhelmed however she has been coping okay. ? ?Patient reports sleep as good.  She takes  the melatonin which helps. ?Reports appetite is fair. ? ?Denies any suicidality, homicidality or perceptual disturbances. ? ?Reports she continues to grieve although it is getting better.  She however is anxious about Mother's Day coming up. ? ?Continues to have her arm in a cast-history of right-sided fracture of the radial head. ? ?Patient denies any other concerns today. ? ? ?Visit Diagnosis:  ?  ICD-10-CM   ?1. Bipolar disorder, in full remission, most recent episode depressed ([place])  F31.76   ?  ?2. PTSD (post-traumatic stress disorder)  F43.10   ?  ?3. Tobacco use disorder  F17.200   ?  ?4. Bereavement  Z63.4   ?  ? ? ?Past Psychiatric History: Reviewed past psychiatric history from progress note on 02/16/2019. ? ?Past Medical History:  ?Past Medical History:  ?Diagnosis Date  ? Anxiety   ? Arthritis   ? C. difficile colitis   ? C. difficile colitis   ? Depression   ? Diabetes mellitus without complication ([place])   ? Type II  ? Diabetes mellitus, type II ([place])   ? Family history of adverse reaction to anesthesia   ? Mother - BP drops  ? Head injury, closed, with brief LOC ([place]) 2011ish  ? Hyperlipidemia   ? Hypertension   ? Insomnia   ? Myalgia 03/29/2018  ? Due to statin  ? Obesity   ? Parkinson's disease ([place])   ? Seasonal allergies   ? Seizures ([place])   ? Tobacco abuse   ?  ?Past Surgical History:  ?  Procedure Laterality Date  ? ABDOMINAL HYSTERECTOMY  2012  ? BREAST BIOPSY Right   ? [place]  ? CHOLECYSTECTOMY  2013  ? LUMBAR LAMINECTOMY/DECOMPRESSION MICRODISCECTOMY Left 09/10/2015  ? Procedure: Left L4-5 Foraminotomy/Left L5-S1 Diskectomy;  Surgeon: Leeroy Cha, MD;  Location: [facility];  Service: Neurosurgery;  Laterality: Left;  Left L4-5 Foraminotomy/Left L5-S1 Diskectomy  ? ? ?Family Psychiatric History: Reviewed family psychiatric history from progress note on 02/16/2019. ? ?Family History:  ?Family History  ?Problem Relation Age of Onset  ? COPD Mother   ? Bipolar disorder Father   ?  Diabetes Mellitus II Father   ? Bipolar disorder Brother   ? Diabetes Mellitus II Brother   ? Bipolar disorder Brother   ? ? ?Social History: Reviewed social history from progress note on 02/16/2019. ?Social History  ? ?Socioeconomic History  ? Marital status: Married  ?  Spouse name: [name]  ? Number of children: 1  ? Years of education: Not on file  ? Highest education level: Associate degree: occupational, Hotel manager, or vocational program  ?Occupational History  ? Occupation: Disabled  ?Tobacco Use  ? Smoking status: Every Day  ?  Packs/day: 0.50  ?  Years: 24.00  ?  Pack years: 12.00  ?  Types: Cigarettes  ?  Start date: 03/19/2004  ? Smokeless tobacco: Never  ? Tobacco comments:  ?  2-3 cig per day since last month - reported 01/01/2021  ?Vaping Use  ? Vaping Use: Never used  ?Substance and Sexual Activity  ? Alcohol use: Yes  ?  Alcohol/week: 0.0 standard drinks  ?  Comment: occasional- rare  ? Drug use: No  ? Sexual activity: Not Currently  ?Other Topics Concern  ? Not on file  ?Social History Narrative  ? Live in private residence w/ husband and their friend who is wheelchair bound, her end of the house is wheelchair accessible.   ? ?Social Determinants of Health  ? ?Financial Resource Strain: Not on file  ?Food Insecurity: Not on file  ?Transportation Needs: Not on file  ?Physical Activity: Not on file  ?Stress: Not on file  ?Social Connections: Not on file  ? ? ?Allergies:  ?Allergies  ?Allergen Reactions  ? Clindamycin/Lincomycin   ?  Pt states caused C-Diff  ? Montelukast Hives and Rash  ?  Asthma attacks ?Asthma attacks  ? Primidone   ?  Diffused erythematous and itchy rash  ? Sulfasalazine Hives  ?  Other reaction(s): Unknown  ? Metformin And Related Diarrhea  ? Benadryl [Diphenhydramine Hcl] Swelling  ?  blistering  ? Canagliflozin Other (See Comments)  ?  Other reaction(s): Other (See Comments) ?Low bp ?Low bp ?Low bp  ? Depakote Er [Divalproex Sodium Er] Other (See Comments)  ?  seizures  ? Dilaudid  [Hydromorphone Hcl] Other (See Comments)  ?  seizures  ? Diphenhydramine-Zinc Acetate Other (See Comments)  ? Erythromycin   ? Ethanol   ? Haloperidol Other (See Comments)  ? Hydromorphone Other (See Comments)  ? Neosporin [Neomycin-Bacitracin Zn-Polymyx] Swelling  ?  blistering  ? Singulair [Montelukast Sodium] Other (See Comments)  ?  Asthma attacks  ? Sitagliptin Other (See Comments)  ? Statins Other (See Comments)  ?  Other reaction(s): Unknown  ? Sulfa Antibiotics Hives  ?  Other reaction(s): Unknown  ? Victoza [Liraglutide] Other (See Comments) and Nausea And Vomiting  ?  Other reaction(s): Other (See Comments) ?pancreatitis ?pancreatitis  ? ? ?Metabolic Disorder Labs: ?Lab Results  ?Component  Value Date  ? HGBA1C 8.3 (H) 09/21/2019  ? MPG 192 09/21/2019  ? MPG 214 06/16/2019  ? ?No results found for: PROLACTIN ?Lab Results  ?Component Value Date  ? CHOL 132 12/13/2020  ? TRIG 190 (H) 12/13/2020  ? HDL 52 12/13/2020  ? CHOLHDL 2.5 12/13/2020  ? VLDL 38 12/13/2020  ? [place] 42 12/13/2020  ? [place] 24 09/21/2019  ? ?Lab Results  ?Component Value Date  ? TSH 1.671 02/16/2019  ? TSH 2.160 08/20/2015  ? ? ?Therapeutic Level Labs: ?No results found for: LITHIUM ?No results found for: VALPROATE ?No components found for:  CBMZ ? ?Current Medications: ?Current Outpatient Medications  ?Medication Sig Dispense Refill  ? aspirin EC 81 MG tablet Take 81 mg by mouth daily.    ? blood glucose meter kit and supplies Use up to four times daily as directed, Dx E11.65, LON 99 months 1 each 0  ? calcium carbonate (OSCAL) 1500 (600 Ca) MG TABS tablet Take 1 tablet by mouth daily with breakfast.     ? clonazePAM (KLONOPIN) 0.5 MG tablet Take 1 tablet (0.5 mg total) by mouth as directed. Take one tablet once a day as needed up to 2-3 times a week only for severe anxiety attacks 12 tablet 0  ? Continuous Blood Gluc Receiver (FREESTYLE LIBRE 14 DAY READER) DEVI 1 Device.    ? Continuous Blood Gluc Sensor (FREESTYLE LIBRE 14 DAY  SENSOR) MISC 1 kit.    ? Evolocumab (REPATHA SURECLICK) 749 MG/ML SOAJ Inject 140 mg into the skin every 14 (fourteen) days. 6 mL 0  ? FARXIGA 10 MG TABS tablet TAKE 1 TABLET DAILY (HIGHER DOSE) 90 tablet 3  ? f

## 2022-03-25 NOTE — Telephone Encounter (Signed)
Incoming fax.  ? ?The request for an appeal for patients [place] has been approved 02/10/2022 through 12/06/2022.  ?

## 2022-04-21 ENCOUNTER — Other Ambulatory Visit: Payer: Self-pay | Admitting: Family Medicine

## 2022-04-21 DIAGNOSIS — Z1231 Encounter for screening mammogram for malignant neoplasm of breast: Secondary | ICD-10-CM

## 2022-05-14 ENCOUNTER — Encounter: Payer: Self-pay | Admitting: Psychiatry

## 2022-05-14 ENCOUNTER — Telehealth (INDEPENDENT_AMBULATORY_CARE_PROVIDER_SITE_OTHER): Payer: Medicare Other | Admitting: Psychiatry

## 2022-05-14 DIAGNOSIS — F17200 Nicotine dependence, unspecified, uncomplicated: Secondary | ICD-10-CM | POA: Diagnosis not present

## 2022-05-14 DIAGNOSIS — F3178 Bipolar disorder, in full remission, most recent episode mixed: Secondary | ICD-10-CM

## 2022-05-14 DIAGNOSIS — F4310 Post-traumatic stress disorder, unspecified: Secondary | ICD-10-CM | POA: Diagnosis not present

## 2022-05-14 MED ORDER — LAMOTRIGINE 100 MG PO TABS: ORAL_TABLET | ORAL | 1 refills | Status: DC

## 2022-05-14 MED ORDER — PAROXETINE HCL 40 MG PO TABS: ORAL_TABLET | ORAL | 1 refills | Status: DC

## 2022-05-14 MED ORDER — LAMOTRIGINE 25 MG PO TABS: 25.0000 mg | ORAL_TABLET | Freq: Every day | ORAL | 1 refills | Status: DC

## 2022-05-14 NOTE — Progress Notes (Signed)
Virtual Visit via Telephone Note  I connected with Debra [name] on 05/14/22 at  4:40 PM EDT by telephone and verified that I am speaking with the correct person using two identifiers.  Location Provider Location : [facility] Patient Location :Car  Participants: Patient , Provider    I discussed the limitations, risks, security and privacy concerns of performing an evaluation and management service by telephone and the availability of in person appointments. I also discussed with the patient that there may be a patient responsible charge related to this service. The patient expressed understanding and agreed to proceed.   I discussed the assessment and treatment plan with the patient. The patient was provided an opportunity to ask questions and all were answered. The patient agreed with the plan and demonstrated an understanding of the instructions.   The patient was advised to call back or seek an in-person evaluation if the symptoms worsen or if the condition fails to improve as anticipated.   [facility] MD OP Progress Note  05/14/2022 6:41 PM Debra [name]  MRN:  [id number]  Chief Complaint:  Chief Complaint  Patient presents with   Follow-up: 61 year old Caucasian female with history of PTSD, bipolar disorder, tobacco use disorder, presented for medication management.   HPI: Debra [name] is a 61 year old Caucasian female, married, disabled, lives in [place], has a history of bipolar disorder, tobacco use disorder, parkinsonian tremors, chronic back pain, hypertension was evaluated by phone today.  Patient today reports she is currently stable with regards to her mood.  Denies any significant anxiety or depressive symptoms.  Patient is compliant on her medications like Paxil, lamotrigine.  Denies side effects.  Patient reports sleep is good.  Patient reports appetite is fair.  Patient denies any suicidality, homicidality or perceptual disturbances.  Patient denies any other concerns  today.  Visit Diagnosis:    ICD-10-CM   1. Bipolar disorder, in full remission, most recent episode mixed (HCC)  F31.78 PARoxetine (PAXIL) 40 MG tablet    2. PTSD (post-traumatic stress disorder)  F43.10 lamoTRIgine (LAMICTAL) 100 MG tablet    lamoTRIgine (LAMICTAL) 25 MG tablet    3. Tobacco use disorder  F17.200       Past Psychiatric History: Reviewed past psychiatric history from progress note on 02/16/2019.  Past Medical History:  Past Medical History:  Diagnosis Date   Anxiety    Arthritis    C. difficile colitis    C. difficile colitis    Depression    Diabetes mellitus without complication ([place])    Type II   Diabetes mellitus, type II ([place])    Family history of adverse reaction to anesthesia    Mother - BP drops   Head injury, closed, with brief LOC ([place]) 2011ish   Hyperlipidemia    Hypertension    Insomnia    Myalgia 03/29/2018   Due to statin   Obesity    Parkinson's disease ([place])    Seasonal allergies    Seizures ([place])    Tobacco abuse     Past Surgical History:  Procedure Laterality Date   ABDOMINAL HYSTERECTOMY  2012   BREAST BIOPSY Right    CESAREAN SECTION  1992   CHOLECYSTECTOMY  2013   LUMBAR LAMINECTOMY/DECOMPRESSION MICRODISCECTOMY Left 09/10/2015   Procedure: Left L4-5 Foraminotomy/Left L5-S1 Diskectomy;  Surgeon: Leeroy Cha, MD;  Location: [facility];  Service: Neurosurgery;  Laterality: Left;  Left L4-5 Foraminotomy/Left L5-S1 Diskectomy    Family Psychiatric History: Reviewed family psychiatric history from progress  note on 02/16/2019.  Family History:  Family History  Problem Relation Age of Onset   COPD Mother    Bipolar disorder Father    Diabetes Mellitus II Father    Bipolar disorder Brother    Diabetes Mellitus II Brother    Bipolar disorder Brother     Social History: Reviewed social history from progress note on 02/16/2019. Social History   Socioeconomic History   Marital status: Married    Spouse name: James    Number of children: 1   Years of education: Not on file   Highest education level: Associate degree: occupational, technical, or vocational program  Occupational History   Occupation: Disabled  Tobacco Use   Smoking status: Every Day    Packs/day: 0.50    Years: 24.00    Total pack years: 12.00    Types: Cigarettes    Start date: 03/19/2004   Smokeless tobacco: Never  Vaping Use   Vaping Use: Never used  Substance and Sexual Activity   Alcohol use: Yes    Alcohol/week: 0.0 standard drinks of alcohol    Comment: occasional- rare   Drug use: No   Sexual activity: Not Currently  Other Topics Concern   Not on file  Social History Narrative   Live in private residence w/ husband and their friend who is wheelchair bound, her end of the house is wheelchair accessible.    Social Determinants of Health   Financial Resource Strain: Medium Risk (05/12/2018)   Overall Financial Resource Strain (CARDIA)    Difficulty of Paying Living Expenses: Somewhat hard  Food Insecurity: Food Insecurity Present (06/13/2019)   Hunger Vital Sign    Worried About Running Out of Food in the Last Year: Sometimes true    Ran Out of Food in the Last Year: Never true  Transportation Needs: Not on file  Physical Activity: Inactive (05/12/2018)   Exercise Vital Sign    Days of Exercise per Week: 0 days    Minutes of Exercise per Session: 0 min  Stress: Stress Concern Present (06/13/2019)   [employer] - Occupational Stress Questionnaire    Feeling of Stress : To some extent  Social Connections: Somewhat Isolated (02/16/2019)   Social Connection and Isolation Panel [NHANES]    Frequency of Communication with Friends and Family: Twice a week    Frequency of Social Gatherings with Friends and Family: Twice a week    Attends Religious Services: Never    Active Member of Clubs or Organizations: No    Attends Club or Organization Meetings: Never    Marital Status: Married    Allergies:   Allergies  Allergen Reactions   Clindamycin/Lincomycin     Pt states caused C-Diff   Montelukast Hives and Rash    Asthma attacks Asthma attacks   Primidone     Diffused erythematous and itchy rash   Sulfasalazine Hives    Other reaction(s): Unknown   Metformin And Related Diarrhea   Benadryl [Diphenhydramine Hcl] Swelling    blistering   Canagliflozin Other (See Comments)    Other reaction(s): Other (See Comments) Low bp Low bp Low bp   Depakote Er [Divalproex Sodium Er] Other (See Comments)    seizures   Dilaudid [Hydromorphone Hcl] Other (See Comments)    seizures   Diphenhydramine-Zinc Acetate Other (See Comments)   Erythromycin    Ethanol    Haloperidol Other (See Comments)   Hydromorphone Other (See Comments)   Neosporin [Neomycin-Bacitracin Zn-Polymyx] Swelling      blistering   Singulair [Montelukast Sodium] Other (See Comments)    Asthma attacks   Sitagliptin Other (See Comments)   Statins Other (See Comments)    Other reaction(s): Unknown   Sulfa Antibiotics Hives    Other reaction(s): Unknown   Victoza [Liraglutide] Other (See Comments) and Nausea And Vomiting    Other reaction(s): Other (See Comments) pancreatitis pancreatitis    Metabolic Disorder Labs: Lab Results  Component Value Date   HGBA1C 8.3 (H) 09/21/2019   MPG 192 09/21/2019   MPG 214 06/16/2019   No results found for: "PROLACTIN" Lab Results  Component Value Date   CHOL 132 12/13/2020   TRIG 190 (H) 12/13/2020   HDL 52 12/13/2020   CHOLHDL 2.5 12/13/2020   VLDL 38 12/13/2020   LDLCALC 42 12/13/2020   LDLCALC 24 09/21/2019   Lab Results  Component Value Date   TSH 1.671 02/16/2019   TSH 2.160 08/20/2015    Therapeutic Level Labs: No results found for: "LITHIUM" No results found for: "VALPROATE" No results found for: "CBMZ"  Current Medications: Current Outpatient Medications  Medication Sig Dispense Refill   aspirin EC 81 MG tablet Take 81 mg by mouth daily.     blood  glucose meter kit and supplies Use up to four times daily as directed, Dx E11.65, LON 99 months 1 each 0   calcium carbonate (OSCAL) 1500 (600 Ca) MG TABS tablet Take 1 tablet by mouth daily with breakfast.      clonazePAM (KLONOPIN) 0.5 MG tablet Take 1 tablet (0.5 mg total) by mouth as directed. Take one tablet once a day as needed up to 2-3 times a week only for severe anxiety attacks 12 tablet 0   Continuous Blood Gluc Receiver (FREESTYLE LIBRE 14 DAY READER) DEVI 1 Device.     Continuous Blood Gluc Sensor (FREESTYLE LIBRE 14 DAY SENSOR) MISC 1 kit.     Evolocumab (REPATHA SURECLICK) 140 MG/ML SOAJ Inject 140 mg into the skin every 14 (fourteen) days. 6 mL 0   FARXIGA 10 MG TABS tablet TAKE 1 TABLET DAILY (HIGHER DOSE) 90 tablet 3   fluconazole (DIFLUCAN) 150 MG tablet PLEASE SEE ATTACHED FOR DETAILED DIRECTIONS     furosemide (LASIX) 20 MG tablet Take 20 mg by mouth daily.     glucose blood (ONE TOUCH ULTRA TEST) test strip USE UP TO FOUR TIMES A DAY AS DIRECTED; Dx 11.51, LON 99 months 300 each 3   HUMALOG KWIKPEN 100 UNIT/ML KwikPen INJECT 20 UNITS UNDER THE SKIN IN THE MORNING AND 35 UNITS WITH LUNCH AND EVENING MEAL (INJECT WITH MEALS) 90 mL 3   Insulin Pen Needle 29G X 12.7MM MISC 1 each by Does not apply route as needed. 100 each 3   lamoTRIgine (LAMICTAL) 100 MG tablet TAKE ONE AND ONE-HALF TABLETS DAILY (DOSE INCREASE) 135 tablet 1   lamoTRIgine (LAMICTAL) 25 MG tablet Take 1 tablet (25 mg total) by mouth daily. Take 1 tablet daily with 150 mg . 90 tablet 1   PARoxetine (PAXIL) 40 MG tablet TAKE 1 TABLET EVERY MORNING 90 tablet 1   pioglitazone (ACTOS) 30 MG tablet TAKE 1 TABLET DAILY 90 tablet 0   pregabalin (LYRICA) 25 MG capsule Take 25 mg by mouth 3 (three) times daily.     propranolol ER (INDERAL LA) 60 MG 24 hr capsule Take 60 mg by mouth daily.     propranolol ER (INDERAL LA) 80 MG 24 hr capsule Take 80 mg by mouth daily.       topiramate (TOPAMAX) 200 MG tablet Take 200 mg by  mouth daily as needed.     TRESIBA FLEXTOUCH 200 UNIT/ML SOPN Inject 120 Units into the skin at bedtime. 36 pen 5   No current facility-administered medications for this visit.     Musculoskeletal: Strength & Muscle Tone:  UTA Gait & Station:  UTA Patient leans: N/A  Psychiatric Specialty Exam: Review of Systems  Musculoskeletal:        S/p right hand fracture - recovering   Psychiatric/Behavioral:  The patient is nervous/anxious.   All other systems reviewed and are negative.   There were no vitals taken for this visit.There is no height or weight on file to calculate BMI.  General Appearance:  UTA  Eye Contact:   UTA  Speech:  Clear and Coherent  Volume:  Normal  Mood:  Anxious coping well  Affect:   UTA  Thought Process:  Goal Directed and Descriptions of Associations: Intact  Orientation:  Full (Time, Place, and Person)  Thought Content: Logical   Suicidal Thoughts:  No  Homicidal Thoughts:  No  Memory:  Immediate;   Fair Recent;   Fair Remote;   Fair  Judgement:  Fair  Insight:  Fair  Psychomotor Activity:   UTA  Concentration:  Concentration: Fair and Attention Span: Fair  Recall:  Fair  Fund of Knowledge: Fair  Language: Fair  Akathisia:  No  Handed:  Right  AIMS (if indicated): not done  Assets:  Communication Skills Desire for Improvement Housing Intimacy Social Support Talents/Skills Transportation  ADL's:  Intact  Cognition: WNL  Sleep:  Fair   Screenings: AIMS    Flowsheet Row Video Visit from 03/20/2022 in [facility]  AIMS Total Score 0      GAD-7    Flowsheet Row Video Visit from 05/14/2022 in [facility] Video Visit from 05/09/2021 in [facility]  Total GAD-7 Score 1 8      Mini-Mental    Flowsheet Row Office Visit from 09/21/2016 in [facility]  Total Score (max 30 points ) 27      PHQ2-9    Flowsheet Row Video Visit from  05/14/2022 in [facility] Video Visit from 03/20/2022 in [facility] Video Visit from 02/17/2022 in [facility] Office Visit from 11/11/2021 in [facility] Video Visit from 05/09/2021 in [facility]  PHQ-2 Total Score 0 0 2 3 1  PHQ-9 Total Score 1 1 14 6 --      Flowsheet Row Video Visit from 05/14/2022 in [facility] Video Visit from 02/17/2022 in [facility] ED [facility]  C-SSRS RISK CATEGORY No Risk No Risk No Risk        Assessment and Plan: Debra [name] is a 60-year-old Caucasian female, married, disabled, lives in [place], has a history of PTSD, bipolar disorder, diabetes, OSA on CPAP, chronic pain, right-sided hand  radius fracture currently recovering, presented for follow-up appointment.  Discussed the following plan.  Plan Bipolar disorder depressed in remission Lamotrigine 175 mg p.o. daily Paxil 40 mg p.o. daily Klonopin 0.5 mg as needed for severe anxiety attacks-limiting use.  PTSD-stable Paxil 40 mg p.o. daily Melatonin 10 mg for sleep  Tobacco use disorder-unstable Provided counseling for 1 minute.  Provided [place] quit now program information. Also discussed stayquitcoach app.  Follow-up in clinic in 3 to   4 months or sooner if needed.     I have spent at least 15 minutes non face to face with patient today.   Consent: Patient/Guardian gives verbal consent for treatment and assignment of benefits for services provided during this visit. Patient/Guardian expressed understanding and agreed to proceed.   This note was generated in part or whole with voice recognition software. Voice recognition is usually quite accurate but there are transcription errors that can and very often do occur. I apologize for any  typographical errors that were not detected and corrected.      Saramma Eappen, MD 05/14/2022, 6:41 PM  

## 2022-05-14 NOTE — Patient Instructions (Signed)
STAYQUITCOACH - APP  Managing the Challenge of Quitting Smoking Quitting smoking is a physical and mental challenge. You may have cravings, withdrawal symptoms, and temptation to smoke. Before quitting, work with your health care provider to make a plan that can help you manage quitting. Making a plan before you quit may keep you from smoking when you have the urge to smoke while trying to quit. How to manage lifestyle changes Managing stress Stress can make you want to smoke, and wanting to smoke may cause stress. It is important to find ways to manage your stress. You could try some of the following: Practice relaxation techniques. Breathe slowly and deeply, in through your nose and out through your mouth. Listen to music. Soak in a bath or take a shower. Imagine a peaceful place or vacation. Get some support. Talk with family or friends about your stress. Join a support group. Talk with a counselor or therapist. Get some physical activity. Go for a walk, run, or bike ride. Play a favorite sport. Practice yoga.  Medicines Talk with your health care provider about medicines that might help you deal with cravings and make quitting easier for you. Relationships Social situations can be difficult when you are quitting smoking. To manage this, you can: Avoid parties and other social situations where people might be smoking. Avoid alcohol. Leave right away if you have the urge to smoke. Explain to your family and friends that you are quitting smoking. Ask for support and let them know you might be a bit grumpy. Plan activities where smoking is not an option. General instructions Be aware that many people gain weight after they quit smoking. However, not everyone does. To keep from gaining weight, have a plan in place before you quit, and stick to the plan after you quit. Your plan should include: Eating healthy snacks. When you have a craving, it may help to: Eat popcorn, or try carrots,  celery, or other cut vegetables. Chew sugar-free gum. Changing how you eat. Eat small portion sizes at meals. Eat 4-6 small meals throughout the day instead of 1-2 large meals a day. Be mindful when you eat. You should avoid watching television or doing other things that might distract you as you eat. Exercising regularly. Make time to exercise each day. If you do not have time for a long workout, do short bouts of exercise for 5-10 minutes several times a day. Do some form of strengthening exercise, such as weight lifting. Do some exercise that gets your heart beating and causes you to breathe deeply, such as walking fast, running, swimming, or biking. This is very important. Drinking plenty of water or other low-calorie or no-calorie drinks. Drink enough fluid to keep your urine pale yellow.  How to recognize withdrawal symptoms Your body and mind may experience discomfort as you try to get used to not having nicotine in your system. These effects are called withdrawal symptoms. They may include: Feeling hungrier than normal. Having trouble concentrating. Feeling irritable or restless. Having trouble sleeping. Feeling depressed. Craving a cigarette. These symptoms may surprise you, but they are normal to have when quitting smoking. To manage withdrawal symptoms: Avoid places, people, and activities that trigger your cravings. Remember why you want to quit. Get plenty of sleep. Avoid coffee and other drinks that contain caffeine. These may worsen some of your symptoms. How to manage cravings Come up with a plan for how to deal with your cravings. The plan should include the following: A definition  of the specific situation you want to deal with. An activity or action you will take to replace smoking. A clear idea for how this action will help. The name of someone who could help you with this. Cravings usually last for 5-10 minutes. Consider taking the following actions to help you  with your plan to deal with cravings: Keep your mouth busy. Chew sugar-free gum. Suck on hard candies or a straw. Brush your teeth. Keep your hands and body busy. Change to a different activity right away. Squeeze or play with a ball. Do an activity or a hobby, such as making bead jewelry, practicing needlepoint, or working with wood. Mix up your normal routine. Take a short exercise break. Go for a quick walk, or run up and down stairs. Focus on doing something kind or helpful for someone else. Call a friend or family member to talk during a craving. Join a support group. Contact a quitline. Where to find support To get help or find a support group: Call the National Cancer Institute's Smoking Quitline: 1-800-QUIT-NOW (504) 044-1755) Text QUIT to SmokefreeTXT: 366294 Where to find more information Visit these websites to find more information on quitting smoking: U.S. Department of Health and Human Services: www.smokefree.gov American Lung Association: www.freedomfromsmoking.org Centers for Disease Control and Prevention (CDC): FootballExhibition.com.br American Heart Association: www.heart.org Contact a health care provider if: You want to change your plan for quitting. The medicines you are taking are not helping. Your eating feels out of control or you cannot sleep. You feel depressed or become very anxious. Summary Quitting smoking is a physical and mental challenge. You will face cravings, withdrawal symptoms, and temptation to smoke again. Preparation can help you as you go through these challenges. Try different techniques to manage stress, handle social situations, and prevent weight gain. You can deal with cravings by keeping your mouth busy (such as by chewing gum), keeping your hands and body busy, calling family or friends, or contacting a quitline for people who want to quit smoking. You can deal with withdrawal symptoms by avoiding places where people smoke, getting plenty of rest, and  avoiding drinks that contain caffeine. This information is not intended to replace advice given to you by your health care provider. Make sure you discuss any questions you have with your health care provider. Document Revised: 11/14/2021 Document Reviewed: 11/14/2021 Elsevier Patient Education  2023 [employer].

## 2022-05-21 ENCOUNTER — Ambulatory Visit
Admission: RE | Admit: 2022-05-21 | Discharge: 2022-05-21 | Disposition: A | Discharge status: Home / Self Care | Payer: Medicare Other | Source: Ambulatory Visit | Attending: Family Medicine | Admitting: Family Medicine

## 2022-05-21 DIAGNOSIS — Z1231 Encounter for screening mammogram for malignant neoplasm of breast: Secondary | ICD-10-CM | POA: Insufficient documentation

## 2022-09-08 ENCOUNTER — Telehealth: Payer: Self-pay | Admitting: Cardiovascular Disease

## 2022-09-08 ENCOUNTER — Encounter: Payer: Self-pay | Admitting: Psychiatry

## 2022-09-08 ENCOUNTER — Ambulatory Visit (INDEPENDENT_AMBULATORY_CARE_PROVIDER_SITE_OTHER): Payer: Medicare Other | Admitting: Psychiatry

## 2022-09-08 ENCOUNTER — Other Ambulatory Visit: Payer: Self-pay | Admitting: *Deleted

## 2022-09-08 VITALS — BP 145/85 | HR 66 | Temp 97.6°F | Ht 65.0 in | Wt 313.0 lb

## 2022-09-08 DIAGNOSIS — F3178 Bipolar disorder, in full remission, most recent episode mixed: Secondary | ICD-10-CM

## 2022-09-08 DIAGNOSIS — F17200 Nicotine dependence, unspecified, uncomplicated: Secondary | ICD-10-CM

## 2022-09-08 DIAGNOSIS — F4310 Post-traumatic stress disorder, unspecified: Secondary | ICD-10-CM | POA: Diagnosis not present

## 2022-09-08 MED ORDER — REPATHA SURECLICK 140 MG/ML ~~LOC~~ SOAJ: 140.0000 mg | SUBCUTANEOUS | 3 refills | Status: DC

## 2022-09-08 NOTE — Telephone Encounter (Signed)
Patient Assistance Paperwork pulled from nurse bin for [place].  Placed on Dr. Donivan Scull desk to review/ sign application and RX.

## 2022-09-08 NOTE — Telephone Encounter (Signed)
Patient dropped off PAF placed in box 

## 2022-09-08 NOTE — Progress Notes (Unsigned)
[facility] MD OP Progress Note  09/08/2022 2:40 PM KAMARRI FISCHETTI  MRN:  [id number]  Chief Complaint:  Chief Complaint  Patient presents with   Follow-up   Depression   Medication Refill   HPI: Debra [name] is a 61 year old Caucasian female, married, disabled, lives in [place], has a history of bipolar disorder, tobacco use disorder, parkinsonian tremors, chronic back pain, hypertension was evaluated in office today.  Patient today reports she is currently doing well with regards to her mood.  Patient reports however few weeks ago she had significant stressors since her roommate's daughter had moved in and that was stressful.  However she has since moved out and it has gotten better.  Patient continues to have lack of motivation, low energy, anxiety more so because of her pain.  If her pain is under control she she could feel better.  Patient reports she is currently compliant on her medications like Paxil, lamotrigine.  Currently denies any side effects.  The medications are beneficial.  Does have clonazepam available however the last time she took it was several months ago when her mother passed away.  She does not use it often.  Does not want to be on anything that is habit-forming.  Patient reports sleep as good as long as her pain is managed.  Denies any suicidality, homicidality or perceptual disturbances.  Reports good support system from her spouse.  Interested in smoking cessation, trying to cut back.  Denies any other concerns today.  Visit Diagnosis:    ICD-10-CM   1. Bipolar disorder, in full remission, most recent episode mixed ([place])  F31.78     2. PTSD (post-traumatic stress disorder)  F43.10     3. Tobacco use disorder  F17.200       Past Psychiatric History: Reviewed past psychiatric history from progress note on 02/16/2019.  Past Medical History:  Past Medical History:  Diagnosis Date   Anxiety    Arthritis    C. difficile colitis    C. difficile colitis     Depression    Diabetes mellitus without complication ([place])    Type II   Diabetes mellitus, type II ([place])    Family history of adverse reaction to anesthesia    Mother - BP drops   Head injury, closed, with brief LOC ([place]) 2011ish   Hyperlipidemia    Hypertension    Insomnia    Myalgia 03/29/2018   Due to statin   Obesity    Parkinson's disease    Seasonal allergies    Seizures ([place])    Tobacco abuse     Past Surgical History:  Procedure Laterality Date   ABDOMINAL HYSTERECTOMY  2012   BREAST BIOPSY Right    CESAREAN SECTION  1992   CHOLECYSTECTOMY  2013   LUMBAR LAMINECTOMY/DECOMPRESSION MICRODISCECTOMY Left 09/10/2015   Procedure: Left L4-5 Foraminotomy/Left L5-S1 Diskectomy;  Surgeon: Leeroy Cha, MD;  Location: [facility];  Service: Neurosurgery;  Laterality: Left;  Left L4-5 Foraminotomy/Left L5-S1 Diskectomy    Family Psychiatric History: Reviewed family psychiatric history from progress note on 02/16/2019.  Family History:  Family History  Problem Relation Age of Onset   COPD Mother    Bipolar disorder Father    Diabetes Mellitus II Father    Bipolar disorder Brother    Diabetes Mellitus II Brother    Bipolar disorder Brother     Social History: Reviewed social history from progress note on 02/16/2019 Social History   Socioeconomic History   Marital  status: Married    Spouse name: [name]   Number of children: 1   Years of education: Not on file   Highest education level: Associate degree: occupational, Hotel manager, or vocational program  Occupational History   Occupation: Disabled  Tobacco Use   Smoking status: Every Day    Packs/day: 0.50    Years: 24.00    Total pack years: 12.00    Types: Cigarettes    Start date: 03/19/2004   Smokeless tobacco: Never  Vaping Use   Vaping Use: Never used  Substance and Sexual Activity   Alcohol use: Yes    Alcohol/week: 0.0 standard drinks of alcohol    Comment: occasional- rare   Drug use: No   Sexual  activity: Not Currently  Other Topics Concern   Not on file  Social History Narrative   Live in private residence w/ husband and their friend who is wheelchair bound, her end of the house is wheelchair accessible.    Social Determinants of Health   Financial Resource Strain: Low Risk  (06/13/2019)   Overall Financial Resource Strain (CARDIA)    Difficulty of Paying Living Expenses: Not hard at all  Food Insecurity: Food Insecurity Present (06/13/2019)   Hunger Vital Sign    Worried About Running Out of Food in the Last Year: Sometimes true    Ran Out of Food in the Last Year: Never true  Transportation Needs: No Transportation Needs (05/12/2018)   PRAPARE - Hydrologist (Medical): No    Lack of Transportation (Non-Medical): No  Physical Activity: Inactive (05/12/2018)   Exercise Vital Sign    Days of Exercise per Week: 0 days    Minutes of Exercise per Session: 0 min  Stress: Stress Concern Present (06/13/2019)   Major    Feeling of Stress : To some extent  Social Connections: Unknown (06/13/2019)   Social Connection and Isolation Panel [NHANES]    Frequency of Communication with Friends and Family: More than three times a week    Frequency of Social Gatherings with Friends and Family: Not on file    Attends Religious Services: Not on file    Active Member of Clubs or Organizations: Not on file    Attends Archivist Meetings: Not on file    Marital Status: Not on file    Allergies:  Allergies  Allergen Reactions   Clindamycin/Lincomycin     Pt states caused C-Diff   Montelukast Hives and Rash    Asthma attacks Asthma attacks   Primidone     Diffused erythematous and itchy rash   Sulfasalazine Hives    Other reaction(s): Unknown   Metformin And Related Diarrhea   Benadryl [Diphenhydramine Hcl] Swelling    blistering   Canagliflozin Other (See Comments)    Other  reaction(s): Other (See Comments) Low bp Low bp Low bp   Depakote Er [Divalproex Sodium Er] Other (See Comments)    seizures   Dilaudid [Hydromorphone Hcl] Other (See Comments)    seizures   Diphenhydramine-Zinc Acetate Other (See Comments)   Erythromycin    Ethanol    Haloperidol Other (See Comments)   Hydromorphone Other (See Comments)   Neosporin [Neomycin-Bacitracin Zn-Polymyx] Swelling    blistering   Singulair [Montelukast Sodium] Other (See Comments)    Asthma attacks   Sitagliptin Other (See Comments)   Statins Other (See Comments)    Other reaction(s): Unknown   Sulfa Antibiotics Hives  Other reaction(s): Unknown   Victoza [Liraglutide] Other (See Comments) and Nausea And Vomiting    Other reaction(s): Other (See Comments) pancreatitis pancreatitis    Metabolic Disorder Labs: Lab Results  Component Value Date   HGBA1C 8.3 (H) 09/21/2019   MPG 192 09/21/2019   MPG 214 06/16/2019   No results found for: "PROLACTIN" Lab Results  Component Value Date   CHOL 132 12/13/2020   TRIG 190 (H) 12/13/2020   HDL 52 12/13/2020   CHOLHDL 2.5 12/13/2020   VLDL 38 12/13/2020   LDLCALC 42 12/13/2020   LDLCALC 24 09/21/2019   Lab Results  Component Value Date   TSH 1.671 02/16/2019   TSH 2.160 08/20/2015    Therapeutic Level Labs: No results found for: "LITHIUM" No results found for: "VALPROATE" No results found for: "CBMZ"  Current Medications: Current Outpatient Medications  Medication Sig Dispense Refill   aspirin EC 81 MG tablet Take 81 mg by mouth daily.     blood glucose meter kit and supplies Use up to four times daily as directed, Dx E11.65, LON 99 months 1 each 0   calcium carbonate (OSCAL) 1500 (600 Ca) MG TABS tablet Take 1 tablet by mouth daily with breakfast.      clonazePAM (KLONOPIN) 0.5 MG tablet Take 1 tablet (0.5 mg total) by mouth as directed. Take one tablet once a day as needed up to 2-3 times a week only for severe anxiety attacks 12 tablet  0   Evolocumab (REPATHA SURECLICK) 161 MG/ML SOAJ Inject 140 mg into the skin every 14 (fourteen) days. 6 mL 0   FARXIGA 10 MG TABS tablet TAKE 1 TABLET DAILY (HIGHER DOSE) 90 tablet 3   fluconazole (DIFLUCAN) 150 MG tablet PLEASE SEE ATTACHED FOR DETAILED DIRECTIONS     furosemide (LASIX) 20 MG tablet Take 20 mg by mouth daily.     glucose blood (ONE TOUCH ULTRA TEST) test strip USE UP TO FOUR TIMES A DAY AS DIRECTED; Dx 11.51, LON 99 months 300 each 3   HUMALOG KWIKPEN 100 UNIT/ML KwikPen INJECT 20 UNITS UNDER THE SKIN IN THE MORNING AND 35 UNITS WITH LUNCH AND EVENING MEAL (INJECT WITH MEALS) 90 mL 3   Insulin Pen Needle 29G X 12.7MM MISC 1 each by Does not apply route as needed. 100 each 3   lamoTRIgine (LAMICTAL) 100 MG tablet TAKE ONE AND ONE-HALF TABLETS DAILY (DOSE INCREASE) 135 tablet 1   lamoTRIgine (LAMICTAL) 25 MG tablet Take 1 tablet (25 mg total) by mouth daily. Take 1 tablet daily with 150 mg . 90 tablet 1   PARoxetine (PAXIL) 40 MG tablet TAKE 1 TABLET EVERY MORNING 90 tablet 1   pioglitazone (ACTOS) 30 MG tablet TAKE 1 TABLET DAILY 90 tablet 0   pregabalin (LYRICA) 25 MG capsule Take 25 mg by mouth 3 (three) times daily.     propranolol ER (INDERAL LA) 80 MG 24 hr capsule Take 80 mg by mouth daily.     topiramate (TOPAMAX) 200 MG tablet Take 200 mg by mouth daily as needed.     TRESIBA FLEXTOUCH 200 UNIT/ML SOPN Inject 120 Units into the skin at bedtime. 36 pen 5   Continuous Blood Gluc Receiver (FREESTYLE LIBRE 14 DAY READER) DEVI 1 Device.     potassium chloride (KLOR-CON) 10 MEQ tablet Take 10 mEq by mouth daily.     propranolol ER (INDERAL LA) 60 MG 24 hr capsule Take 60 mg by mouth daily.     No current facility-administered medications  for this visit.     Musculoskeletal: Strength & Muscle Tone: within normal limits Gait & Station: normal Patient leans: N/A  Psychiatric Specialty Exam: Review of Systems  Musculoskeletal:        BL shoulder, feet, back pain-  chronic  Psychiatric/Behavioral:  Positive for sleep disturbance. The patient is nervous/anxious.   All other systems reviewed and are negative.   Blood pressure (!) 145/85, pulse 66, temperature 97.6 F (36.4 C), temperature source Temporal, height 5' 5" (1.651 m), weight (!) 313 lb (142 kg).Body mass index is 52.09 kg/m.  General Appearance: Casual  Eye Contact:  Fair  Speech:  Clear and Coherent  Volume:  Normal  Mood:  Anxious  Affect:  Appropriate  Thought Process:  Goal Directed and Descriptions of Associations: Intact  Orientation:  Full (Time, Place, and Person)  Thought Content: Logical   Suicidal Thoughts:  No  Homicidal Thoughts:  No  Memory:  Immediate;   Fair Recent;   Fair Remote;   Fair  Judgement:  Fair  Insight:  Fair  Psychomotor Activity:  Normal  Concentration:  Concentration: Fair and Attention Span: Fair  Recall:  [employer] of Knowledge: Fair  Language: Fair  Akathisia:  No  Handed:  Right  AIMS (if indicated): not done  Assets:  Communication Skills Desire for Improvement Housing Social Support  ADL's:  Intact  Cognition: WNL  Sleep:   Restless due to pain   Screenings: [place] Video Visit from 03/20/2022 in [facility] Total Score [place] Visit from 09/08/2022 in [facility] Video Visit from 05/14/2022 in [facility] Video Visit from 05/09/2021 in [facility]  Total GAD-7 Score _0 [place] Visit from 09/21/2016 in [facility]  Total Score (max 30 points ) 27      PHQ2-9    [place] Visit from 09/08/2022 in [place] Video Visit from 05/14/2022 in [place] Video Visit from 03/20/2022 in [place] Video Visit from  02/17/2022 in [facility] Office Visit from 11/11/2021 in [facility]  PHQ-2 Total Score 3 0 0 2 3  PHQ-9 Total Score _1 [place] Office Visit from 09/08/2022 in [place] Video Visit from 05/14/2022 in [place] Video Visit from 02/17/2022 in [place] No Risk No Risk No Risk        Assessment and Plan: Debra [name] is a 61 year old Caucasian female, married, disabled, lives in [place], has a history of PTSD, bipolar disorder, diabetes, OSA on CPAP, chronic pain, presented for medication management.  Patient with multiple psychosocial stressors including pain which does have an impact on mood and sleep, will benefit from the following plan.  Plan Bipolar disorder in remission Lamotrigine 175 mg p.o. daily Paxil 40 mg p.o. daily Klonopin 0.5 mg as needed for severe anxiety attacks-limiting use. Reviewed [place] PMP [place]  PTSD-stable Paxil 40 mg p.o. daily.   Melatonin 10 mg p.o. nightly for sleep  Tobacco use disorder-improving Provided counseling for 1 minute.   Patient does report pain affecting her mood and sleep, patient advised to follow up with provider, primary, for  sufficient management of her pain.  Patient with elevated blood pressure reading in session today, blood pressure was repeated and advised to follow-up with primary care.  Follow-up in clinic in 3 to 4 months or sooner if needed.   This note was generated in part or whole with voice recognition software. Voice recognition is usually quite accurate but there are transcription errors that can and very often do occur. I apologize for any typographical errors that were not detected and corrected.      Ursula Alert, MD 09/08/2022, 2:40 PM

## 2022-09-09 NOTE — Telephone Encounter (Signed)
Completed & signed application for Repatha assistance faxed to [employer] at [phone]. Fax Confirmation received.  Placed in the file cabinet for completed assistance applications.

## 2022-09-18 ENCOUNTER — Telehealth: Payer: Self-pay | Admitting: Cardiovascular Disease

## 2022-09-18 NOTE — Telephone Encounter (Signed)
Per [email or web address]: This medication or product was previously approved on [id number] from 2022-02-10 to 2022-12-06. **Please note: This request was submitted electronically. Formulary lowering, tiering exception, cost reduction and/or pre-benefit determination review (including prospective Medicare hospice reviews) requests cannot be requested using this method of submission. Providers contact us at [phone] for further assistance.

## 2022-09-18 NOTE — Telephone Encounter (Signed)
Spoke with Debra [name] at [facility] who states this medication was approved on 06-08-2022 through 12-06-2022. Ref# [id number] She will fax a copy of the approval letter within the next 5-10 minutes to our office so it can be sent to [place] as requested to include with patient assistance application.

## 2022-09-18 NOTE — Telephone Encounter (Signed)
Spoke with patient and she states they need prior authorization for her Repatha. She states this needs to be faxed to both her pharmacy and the [employer] patient assistance foundation. Will send over to our team for review and assistance with this prior auth to be done.

## 2022-09-18 NOTE — Telephone Encounter (Signed)
New Message:     Patient says she needs a prior authorization for her Repatha please. 

## 2022-09-18 NOTE — Telephone Encounter (Signed)
Notified patient that we received something from [place] but it did not have her DOB on form so we were not sure if it was her. Advised that we will take care of it and if she should have any further issues to give [place] a call back.

## 2022-09-18 NOTE — Telephone Encounter (Signed)
Approval letter received and faxed to [employer].

## 2022-12-03 ENCOUNTER — Telehealth: Payer: Self-pay | Admitting: Cardiovascular Disease

## 2022-12-03 NOTE — Telephone Encounter (Signed)
Patient dropped off patient assistance forms to be completed and faxed for Repatha Placed in nurse box

## 2022-12-03 NOTE — Telephone Encounter (Signed)
Will review information provided and assist with processing.

## 2022-12-08 ENCOUNTER — Other Ambulatory Visit: Payer: Self-pay | Admitting: Psychiatry

## 2022-12-08 DIAGNOSIS — F4310 Post-traumatic stress disorder, unspecified: Secondary | ICD-10-CM

## 2022-12-09 ENCOUNTER — Encounter: Payer: Self-pay | Admitting: Psychiatry

## 2022-12-09 ENCOUNTER — Telehealth (INDEPENDENT_AMBULATORY_CARE_PROVIDER_SITE_OTHER): Payer: Medicare Other | Admitting: Psychiatry

## 2022-12-09 DIAGNOSIS — F4310 Post-traumatic stress disorder, unspecified: Secondary | ICD-10-CM | POA: Diagnosis not present

## 2022-12-09 DIAGNOSIS — F3178 Bipolar disorder, in full remission, most recent episode mixed: Secondary | ICD-10-CM | POA: Diagnosis not present

## 2022-12-09 DIAGNOSIS — F17200 Nicotine dependence, unspecified, uncomplicated: Secondary | ICD-10-CM

## 2022-12-09 MED ORDER — REPATHA SURECLICK 140 MG/ML ~~LOC~~ SOAJ: 140.0000 mg | SUBCUTANEOUS | 3 refills | Status: DC

## 2022-12-09 MED ORDER — LAMOTRIGINE 100 MG PO TABS: ORAL_TABLET | ORAL | 1 refills | Status: DC

## 2022-12-09 MED ORDER — PAROXETINE HCL 40 MG PO TABS: ORAL_TABLET | ORAL | 1 refills | Status: DC

## 2022-12-09 NOTE — Progress Notes (Unsigned)
Virtual Visit via Video Note  I connected with Debra [name] on 12/09/22 at  1:00 PM EST by a video enabled telemedicine application and verified that I am speaking with the correct person using two identifiers.  Location Provider Location : [facility] Patient Location : Home  Participants: Patient , Provider    I discussed the limitations of evaluation and management by telemedicine and the availability of in person appointments. The patient expressed understanding and agreed to proceed.   I discussed the assessment and treatment plan with the patient. The patient was provided an opportunity to ask questions and all were answered. The patient agreed with the plan and demonstrated an understanding of the instructions.   The patient was advised to call back or seek an in-person evaluation if the symptoms worsen or if the condition fails to improve as anticipated.   Debra Valley MD OP Progress Note  12/09/2022 1:31 PM Debra [name]  MRN:  [id number]  Chief Complaint:  Chief Complaint  Patient presents with   Follow-up   Medication Refill   Anxiety   HPI: Debra [name] is a 62 year old Caucasian female, married, disabled, lives in [place], has a history of bipolar disorder, PTSD, tobacco use disorder, Parkinsonian tremors, chronic back pain, hypertension was evaluated by telemedicine today.  Patient reports she is currently struggling with flulike symptoms, sinus pressure, has upcoming appointment with primary care provider this afternoon.  Patient otherwise reports she has been doing fairly well with regards to her mood.  Denies any significant anxiety or depression symptoms.  Denies any sleep problems.  Has not had any significant panic attacks.  Reports she missed her mom who passed away a year ago on New Year's eve.  Hence it was hard for her during the holidays.  She however was able to take her clonazepam that day and that helped.  She does not use it much and has supplies from a previous prescription.     Patient appeared to be alert, oriented to person place time situation.  3 word memory immediate 3 out of 3, after 5 minutes 3 out of 3.  Attention and focus seem to be good, patient was able to spell the word 'WORLD' forward and backward.  Patient unable to do calculation.  Patient denies any suicidality, homicidality or perceptual disturbances.  Patient denies any other concerns today.  Visit Diagnosis:    ICD-10-CM   1. Bipolar disorder, in full remission, most recent episode mixed (HCC)  F31.78 PARoxetine (PAXIL) 40 MG tablet    2. PTSD (post-traumatic stress disorder)  F43.10 lamoTRIgine (LAMICTAL) 100 MG tablet    3. Tobacco use disorder  F17.200       Past Psychiatric History: Reviewed past psychiatric history from progress note on 02/16/2019.  Past Medical History:  Past Medical History:  Diagnosis Date   Anxiety    Arthritis    C. difficile colitis    C. difficile colitis    Depression    Diabetes mellitus without complication ([place])    Type II   Diabetes mellitus, type II ([place])    Family history of adverse reaction to anesthesia    Mother - BP drops   Head injury, closed, with brief LOC ([place]) 2011ish   Hyperlipidemia    Hypertension    Insomnia    Myalgia 03/29/2018   Due to statin   Obesity    Parkinson's disease    Seasonal allergies    Seizures ([place])    Tobacco abuse  Past Surgical History:  Procedure Laterality Date   ABDOMINAL HYSTERECTOMY  2012   BREAST BIOPSY Right    CESAREAN SECTION  1992   CHOLECYSTECTOMY  2013   LUMBAR LAMINECTOMY/DECOMPRESSION MICRODISCECTOMY Left 09/10/2015   Procedure: Left L4-5 Foraminotomy/Left L5-S1 Diskectomy;  Surgeon: Leeroy Cha, MD;  Location: [facility];  Service: Neurosurgery;  Laterality: Left;  Left L4-5 Foraminotomy/Left L5-S1 Diskectomy    Family Psychiatric History: Reviewed family psychiatric history from progress note on 02/16/2019.  Family History:  Family History  Problem Relation Age of Onset    COPD Mother    Bipolar disorder Father    Diabetes Mellitus II Father    Bipolar disorder Brother    Diabetes Mellitus II Brother    Bipolar disorder Brother     Social History: Reviewed social history from progress note on 02/16/2019. Social History   Socioeconomic History   Marital status: Married    Spouse name: [name]   Number of children: 1   Years of education: Not on file   Highest education level: Associate degree: occupational, Hotel manager, or vocational program  Occupational History   Occupation: Disabled  Tobacco Use   Smoking status: Every Day    Packs/day: 0.50    Years: 24.00    Total pack years: 12.00    Types: Cigarettes    Start date: 03/19/2004   Smokeless tobacco: Never  Vaping Use   Vaping Use: Never used  Substance and Sexual Activity   Alcohol use: Yes    Alcohol/week: 0.0 standard drinks of alcohol    Comment: occasional- rare   Drug use: No   Sexual activity: Not Currently  Other Topics Concern   Not on file  Social History Narrative   Live in private residence w/ husband and their friend who is wheelchair bound, her end of the house is wheelchair accessible.    Social Determinants of Health   Financial Resource Strain: Low Risk  (06/13/2019)   Overall Financial Resource Strain (CARDIA)    Difficulty of Paying Living Expenses: Not hard at all  Food Insecurity: Food Insecurity Present (06/13/2019)   Hunger Vital Sign    Worried About Running Out of Food in the Last Year: Sometimes true    Ran Out of Food in the Last Year: Never true  Transportation Needs: No Transportation Needs (05/12/2018)   PRAPARE - Hydrologist (Medical): No    Lack of Transportation (Non-Medical): No  Physical Activity: Inactive (05/12/2018)   Exercise Vital Sign    Days of Exercise per Week: 0 days    Minutes of Exercise per Session: 0 min  Stress: Stress Concern Present (06/13/2019)   [place]    Feeling of Stress : To some extent  Social Connections: Unknown (06/13/2019)   Social Connection and Isolation Panel [NHANES]    Frequency of Communication with Friends and Family: More than three times a week    Frequency of Social Gatherings with Friends and Family: Not on file    Attends Religious Services: Not on file    Active Member of Clubs or Organizations: Not on file    Attends Archivist Meetings: Not on file    Marital Status: Not on file    Allergies:  Allergies  Allergen Reactions   Clindamycin/Lincomycin     Pt states caused C-Diff   Montelukast Hives and Rash    Asthma attacks Asthma attacks   Primidone  Diffused erythematous and itchy rash   Sulfasalazine Hives    Other reaction(s): Unknown   Metformin And Related Diarrhea   Benadryl [Diphenhydramine Hcl] Swelling    blistering   Canagliflozin Other (See Comments)    Other reaction(s): Other (See Comments) Low bp Low bp Low bp   Depakote Er [Divalproex Sodium Er] Other (See Comments)    seizures   Dilaudid [Hydromorphone Hcl] Other (See Comments)    seizures   Diphenhydramine-Zinc Acetate Other (See Comments)   Erythromycin    Ethanol    Haloperidol Other (See Comments)   Hydromorphone Other (See Comments)   Neosporin [Neomycin-Bacitracin Zn-Polymyx] Swelling    blistering   Singulair [Montelukast Sodium] Other (See Comments)    Asthma attacks   Sitagliptin Other (See Comments)   Statins Other (See Comments)    Other reaction(s): Unknown   Sulfa Antibiotics Hives    Other reaction(s): Unknown   Victoza [Liraglutide] Other (See Comments) and Nausea And Vomiting    Other reaction(s): Other (See Comments) pancreatitis pancreatitis    Metabolic Disorder Labs: Lab Results  Component Value Date   HGBA1C 8.3 (H) 09/21/2019   MPG 192 09/21/2019   MPG 214 06/16/2019   No results found for: "PROLACTIN" Lab Results  Component Value Date   CHOL 132 12/13/2020   TRIG  190 (H) 12/13/2020   HDL 52 12/13/2020   CHOLHDL 2.5 12/13/2020   VLDL 38 12/13/2020   LDLCALC 42 12/13/2020   LDLCALC 24 09/21/2019   Lab Results  Component Value Date   TSH 1.671 02/16/2019   TSH 2.160 08/20/2015    Therapeutic Level Labs: No results found for: "LITHIUM" No results found for: "VALPROATE" No results found for: "CBMZ"  Current Medications: Current Outpatient Medications  Medication Sig Dispense Refill   acetic acid 2 % otic solution      aspirin EC 81 MG tablet Take 81 mg by mouth daily.     bifidobacterium infantis (ALIGN) capsule Take 1 capsule by mouth daily.     blood glucose meter kit and supplies Use up to four times daily as directed, Dx E11.65, LON 99 months 1 each 0   calcium carbonate (OSCAL) 1500 (600 Ca) MG TABS tablet Take 1 tablet by mouth daily with breakfast.      clonazePAM (KLONOPIN) 0.5 MG tablet Take 1 tablet (0.5 mg total) by mouth as directed. Take one tablet once a day as needed up to 2-3 times a week only for severe anxiety attacks 12 tablet 0   Continuous Blood Gluc Receiver (FREESTYLE LIBRE 14 DAY READER) DEVI 1 Device.     Evolocumab (REPATHA SURECLICK) 403 MG/ML SOAJ Inject 140 mg into the skin every 14 (fourteen) days. 6 mL 3   FARXIGA 10 MG TABS tablet TAKE 1 TABLET DAILY (HIGHER DOSE) 90 tablet 3   fluconazole (DIFLUCAN) 150 MG tablet PLEASE SEE ATTACHED FOR DETAILED DIRECTIONS     furosemide (LASIX) 20 MG tablet Take 20 mg by mouth daily.     glucose blood (ONE TOUCH ULTRA TEST) test strip USE UP TO FOUR TIMES A DAY AS DIRECTED; Dx 11.51, LON 99 months 300 each 3   HUMALOG KWIKPEN 100 UNIT/ML KwikPen INJECT 20 UNITS UNDER THE SKIN IN THE MORNING AND 35 UNITS WITH LUNCH AND EVENING MEAL (INJECT WITH MEALS) 90 mL 3   Insulin Pen Needle 29G X 12.7MM MISC 1 each by Does not apply route as needed. 100 each 3   lamoTRIgine (LAMICTAL) 25 MG tablet TAKE 1  TABLET BY MOUTH DAILY (TAKE WITH 100 MG TABLETS) 90 tablet 1   pioglitazone (ACTOS) 30  MG tablet TAKE 1 TABLET DAILY 90 tablet 0   potassium chloride (KLOR-CON) 10 MEQ tablet Take 10 mEq by mouth daily.     pregabalin (LYRICA) 25 MG capsule Take 25 mg by mouth 3 (three) times daily.     propranolol ER (INDERAL LA) 60 MG 24 hr capsule Take 60 mg by mouth daily.     propranolol ER (INDERAL LA) 80 MG 24 hr capsule Take 80 mg by mouth daily.     topiramate (TOPAMAX) 100 MG tablet Take 100 mg by mouth 2 (two) times daily.     TRESIBA FLEXTOUCH 200 UNIT/ML SOPN Inject 120 Units into the skin at bedtime. 36 pen 5   lamoTRIgine (LAMICTAL) 100 MG tablet TAKE ONE AND ONE-HALF TABLETS DAILY (DOSE INCREASE), take along with 25 mg tablet as instructed. 135 tablet 1   PARoxetine (PAXIL) 40 MG tablet TAKE 1 TABLET EVERY MORNING 90 tablet 1   No current facility-administered medications for this visit.     Musculoskeletal: Strength & Muscle Tone:  UTA Gait & Station:  Normal Patient leans: N/A  Psychiatric Specialty Exam: Review of Systems  HENT:  Positive for rhinorrhea, sinus pressure, sinus pain and sore throat.   Musculoskeletal:  Positive for myalgias.  Neurological:  Positive for headaches.  Psychiatric/Behavioral:  The patient is nervous/anxious.   All other systems reviewed and are negative.   There were no vitals taken for this visit.There is no height or weight on file to calculate BMI.  General Appearance: Casual  Eye Contact:  Fair  Speech:  Clear and Coherent  Volume:  Normal  Mood:  Anxious  Affect:  Congruent  Thought Process:  Goal Directed and Descriptions of Associations: Intact  Orientation:  Full (Time, Place, and Person)  Thought Content: Logical   Suicidal Thoughts:  No  Homicidal Thoughts:  No  Memory:  Immediate;   Fair Recent;   Fair Remote;   Fair  Judgement:  Fair  Insight:  Fair  Psychomotor Activity:  Normal  Concentration:  Concentration: Fair and Attention Span: Fair  Recall:  [employer] of Knowledge: Fair  Language: Fair  Akathisia:  No   Handed:  Right  AIMS (if indicated): not done  Assets:  Communication Skills Desire for Improvement Housing Social Support Talents/Skills  ADL's:  Intact  Cognition: WNL  Sleep:  Fair   Screenings: AIMS    Flowsheet Row Video Visit from 03/20/2022 in [facility] Total Score 0      [place] Office Visit from 09/08/2022 in [facility] Video Visit from 05/14/2022 in [facility] Video Visit from 05/09/2021 in [facility]  Total GAD-7 Score _0 [place] Visit from 09/21/2016 in [facility]  Total Score (max 30 points ) 27      PHQ2-9    Flowsheet Row Video Visit from 12/09/2022 in [place] Office Visit from 09/08/2022 in [facility] Video Visit from 05/14/2022 in [place] Video Visit from 03/20/2022 in [facility] [facility] Video Visit from 02/17/2022 in [place]  PHQ-2 Total Score 1 3 0 0 2  PHQ-9 Total Score -- _1 Flowsheet Row Video Visit  from 12/09/2022 in [place] Office Visit from 09/08/2022 in [place] Video Visit from 05/14/2022 in [facility] No Risk No Risk No Risk        Assessment and Plan: KEYONDRA LAGRAND is a 62 year old Caucasian female, married, disabled, lives in [place], has a history of PTSD, bipolar disorder, diabetes, OSA on CPAP, chronic pain was evaluated by telemedicine today.  Patient is currently struggling with flulike symptoms otherwise doing well on the current medication regimen.  Plan as noted below.  Plan Bipolar disorder in remission Lamotrigine 175 mg p.o. daily Paxil 40 mg p.o. daily Klonopin 0.5  mg as needed for severe anxiety attacks-limiting use Reviewed [place] PMP AWARxE  PTSD-stable Paxil 40 mg p.o. daily Melatonin 10 mg p.o. nightly as needed for sleep  Tobacco use disorder-improving Patient is currently cutting back.  Provided counseling for 1 minute.   Patient advised to follow up with primary care provider for flulike symptoms.  Follow-up in clinic in 3 months or sooner in person. Collaboration of Care: Collaboration of Care: Primary Care Provider AEB encouraged to follow up with primary care provider for flulike symptoms.  Patient/Guardian was advised Release of Information must be obtained prior to any record release in order to collaborate their care with an outside provider. Patient/Guardian was advised if they have not already done so to contact the registration department to sign all necessary forms in order for [place] to release information regarding their care.   Consent: Patient/Guardian gives verbal consent for treatment and assignment of benefits for services provided during this visit. Patient/Guardian expressed understanding and agreed to proceed.   This note was generated in part or whole with voice recognition software. Voice recognition is usually quite accurate but there are transcription errors that can and very often do occur. I apologize for any typographical errors that were not detected and corrected.      Ursula Alert, MD 12/10/2022, 9:26 AM

## 2022-12-09 NOTE — Telephone Encounter (Signed)
Application completed, faxed, and filed.  

## 2022-12-18 ENCOUNTER — Other Ambulatory Visit: Payer: Self-pay | Admitting: *Deleted

## 2022-12-18 MED ORDER — REPATHA SURECLICK 140 MG/ML ~~LOC~~ SOAJ: 140.0000 mg | SUBCUTANEOUS | 3 refills | Status: DC

## 2022-12-18 MED ORDER — REPATHA SURECLICK 140 MG/ML ~~LOC~~ SOAJ: 140.0000 mg | SUBCUTANEOUS | 3 refills | Status: AC

## 2022-12-18 NOTE — Addendum Note (Signed)
Addended by: Valora Corporal on: 12/18/2022 03:20 PM   Modules accepted: Orders

## 2022-12-18 NOTE — Telephone Encounter (Signed)
Spoke with patient and advised that the prescription has been sent over to her pharmacy. She verbalized understanding.

## 2022-12-18 NOTE — Telephone Encounter (Signed)
Patient stated her application for Repatha was denied for the [employer] but was accepted for the [employer] and she will need her prescription for Amesti faxed to [place], [place].

## 2022-12-22 ENCOUNTER — Other Ambulatory Visit (HOSPITAL_COMMUNITY): Payer: Self-pay

## 2022-12-23 NOTE — Telephone Encounter (Signed)
Per fax received from the [facility], patient has been approved for a grant through the Hypercholesterolemia - Medicare Access fund. The grant amount is $2500 and the enrollment period is from 11/17/2022 to 11/17/2023.

## 2023-03-10 ENCOUNTER — Encounter: Payer: Self-pay | Admitting: Psychiatry

## 2023-03-10 ENCOUNTER — Ambulatory Visit (INDEPENDENT_AMBULATORY_CARE_PROVIDER_SITE_OTHER): Payer: Medicare HMO | Admitting: Psychiatry

## 2023-03-10 VITALS — BP 130/66 | HR 69 | Temp 97.8°F | Ht 65.0 in | Wt 305.4 lb

## 2023-03-10 DIAGNOSIS — F3162 Bipolar disorder, current episode mixed, moderate: Secondary | ICD-10-CM | POA: Diagnosis not present

## 2023-03-10 DIAGNOSIS — F17200 Nicotine dependence, unspecified, uncomplicated: Secondary | ICD-10-CM

## 2023-03-10 DIAGNOSIS — F4310 Post-traumatic stress disorder, unspecified: Secondary | ICD-10-CM

## 2023-03-10 MED ORDER — TRAZODONE HCL 50 MG PO TABS
50.0000 mg | ORAL_TABLET | Freq: Every evening | ORAL | 0 refills | Status: DC | PRN
Start: 2023-03-10 — End: 2023-09-22

## 2023-03-10 MED ORDER — LAMOTRIGINE 100 MG PO TABS: 100.0000 mg | ORAL_TABLET | Freq: Two times a day (BID) | ORAL | 0 refills | Status: DC

## 2023-03-10 MED ORDER — CLONAZEPAM 0.5 MG PO TABS: 0.5000 mg | ORAL_TABLET | ORAL | 0 refills | Status: AC

## 2023-03-10 NOTE — Patient Instructions (Signed)
  www.openpathcollective.org  www.psychologytoday  [facility] [email or web address] [address], [place], [place] [address]   [phone]  [facility] [email or web address] [address], [place], [place] [address]  [phone]   [facility] - [phone]  [facility] - [phone]  [facility] - [phone]   [facility] - [phone]  [facility] - [phone]     

## 2023-03-10 NOTE — Progress Notes (Unsigned)
[place] MD OP Progress Note  03/10/2023 5:04 PM [name]  MRN:  [id number]  Chief Complaint:  Chief Complaint  Patient presents with   Follow-up   Anxiety   Medication Refill   HPI: Debra [name] is a 62 year old Caucasian female, married, disabled, lives in [place], has a history of bipolar disorder, PTSD, tobacco use disorder, Parkinsonian tremors, chronic back pain, hypertension was evaluated by telemedicine today.  Patient today reports she is currently struggling with relationship struggles with her roommate.  She reports she took a vacation to [place] to visit family recently.  Patient reports she had requested her husband to have a conversation with her roommate while she went away so that the room mate starts doing things for herself.  Patient however reports when she came back nothing had changed .  This has been frustrating for her.  Patient hence currently struggles with anxiety, sadness, low energy, low motivation, decreased appetite, sleep problems, mood swings.  Getting worse since the past few days.  Ongoing since the past several weeks.  Patient reports she has been compliant on her CPAP at night.  She however has not been able to fall asleep or maintain sleep.  Has tried trazodone in the past which may have helped.  Agreeable to trial.  Currently compliant on Lamictal, Paxil.  Denies side effects.  Patient currently does not have a psychotherapist, agreeable to establish care.  Patient denies any suicidality, homicidality or perceptual disturbances.  Patient denies any other concerns today.  Visit Diagnosis:    ICD-10-CM   1. Bipolar 1 disorder, mixed, moderate  F31.62 clonazePAM (KLONOPIN) 0.5 MG tablet    2. PTSD (post-traumatic stress disorder)  F43.10 lamoTRIgine (LAMICTAL) 100 MG tablet    traZODone (DESYREL) 50 MG tablet    clonazePAM (KLONOPIN) 0.5 MG tablet    3. Tobacco use disorder  F17.200       Past Psychiatric History: I have reviewed past psychiatric  history from progress note on 02/16/2019.  Past Medical History:  Past Medical History:  Diagnosis Date   Anxiety    Arthritis    C. difficile colitis    C. difficile colitis    Depression    Diabetes mellitus without complication    Type II   Diabetes mellitus, type II    Family history of adverse reaction to anesthesia    Mother - BP drops   Head injury, closed, with brief LOC 2011ish   Hyperlipidemia    Hypertension    Insomnia    Myalgia 03/29/2018   Due to statin   Obesity    Parkinson's disease    Seasonal allergies    Seizures    Tobacco abuse     Past Surgical History:  Procedure Laterality Date   ABDOMINAL HYSTERECTOMY  2012   BREAST BIOPSY Right    CESAREAN SECTION  1992   CHOLECYSTECTOMY  2013   LUMBAR LAMINECTOMY/DECOMPRESSION MICRODISCECTOMY Left 09/10/2015   Procedure: Left L4-5 Foraminotomy/Left L5-S1 Diskectomy;  Surgeon: Leeroy Cha, MD;  Location: [facility];  Service: Neurosurgery;  Laterality: Left;  Left L4-5 Foraminotomy/Left L5-S1 Diskectomy    Family Psychiatric History: Reviewed family psychiatric history from progress note on 02/16/2019.  Family History:  Family History  Problem Relation Age of Onset   COPD Mother    Bipolar disorder Father    Diabetes Mellitus II Father    Bipolar disorder Brother    Diabetes Mellitus II Brother    Bipolar disorder Brother  Social History: Reviewed social history from progress note on 02/16/2019. Social History   Socioeconomic History   Marital status: Married    Spouse name: Debra [name]   Number of children: 1   Years of education: Not on file   Highest education level: Associate degree: occupational, Hotel manager, or vocational program  Occupational History   Occupation: Disabled  Tobacco Use   Smoking status: Every Day    Packs/day: 0.50    Years: 24.00    Additional pack years: 0.00    Total pack years: 12.00    Types: Cigarettes    Start date: 03/19/2004   Smokeless tobacco: Never  Vaping  Use   Vaping Use: Never used  Substance and Sexual Activity   Alcohol use: Yes    Alcohol/week: 0.0 standard drinks of alcohol    Comment: occasional- rare   Drug use: No   Sexual activity: Not Currently  Other Topics Concern   Not on file  Social History Narrative   Live in private residence w/ husband and their friend who is wheelchair bound, her end of the house is wheelchair accessible.    Social Determinants of Health   Financial Resource Strain: Low Risk  (06/13/2019)   Overall Financial Resource Strain (CARDIA)    Difficulty of Paying Living Expenses: Not hard at all  Food Insecurity: Food Insecurity Present (06/13/2019)   Hunger Vital Sign    Worried About Running Out of Food in the Last Year: Sometimes true    Ran Out of Food in the Last Year: Never true  Transportation Needs: No Transportation Needs (05/12/2018)   PRAPARE - Hydrologist (Medical): No    Lack of Transportation (Non-Medical): No  Physical Activity: Inactive (05/12/2018)   Exercise Vital Sign    Days of Exercise per Week: 0 days    Minutes of Exercise per Session: 0 min  Stress: Stress Concern Present (06/13/2019)   [place]    Feeling of Stress : To some extent  Social Connections: Unknown (06/13/2019)   Social Connection and Isolation Panel [NHANES]    Frequency of Communication with Friends and Family: More than three times a week    Frequency of Social Gatherings with Friends and Family: Not on file    Attends Religious Services: Not on file    Active Member of Clubs or Organizations: Not on file    Attends Archivist Meetings: Not on file    Marital Status: Not on file    Allergies:  Allergies  Allergen Reactions   Clindamycin/Lincomycin     Pt states caused C-Diff   Montelukast Hives and Rash    Asthma attacks Asthma attacks   Primidone     Diffused erythematous and itchy rash   Sulfasalazine  Hives    Other reaction(s): Unknown   Metformin And Related Diarrhea   Benadryl [Diphenhydramine Hcl] Swelling    blistering   Canagliflozin Other (See Comments)    Other reaction(s): Other (See Comments) Low bp Low bp Low bp   Depakote Er [Divalproex Sodium Er] Other (See Comments)    seizures   Dilaudid [Hydromorphone Hcl] Other (See Comments)    seizures   Diphenhydramine-Zinc Acetate Other (See Comments)   Erythromycin    Ethanol    Haloperidol Other (See Comments)   Hydromorphone Other (See Comments)   Neosporin [Neomycin-Bacitracin Zn-Polymyx] Swelling    blistering   Singulair [Montelukast Sodium] Other (See Comments)  Asthma attacks   Sitagliptin Other (See Comments)   Statins Other (See Comments)    Other reaction(s): Unknown   Sulfa Antibiotics Hives    Other reaction(s): Unknown   Victoza [Liraglutide] Other (See Comments) and Nausea And Vomiting    Other reaction(s): Other (See Comments) pancreatitis pancreatitis    Metabolic Disorder Labs: Lab Results  Component Value Date   HGBA1C 8.3 (H) 09/21/2019   MPG 192 09/21/2019   MPG 214 06/16/2019   No results found for: "PROLACTIN" Lab Results  Component Value Date   CHOL 132 12/13/2020   TRIG 190 (H) 12/13/2020   HDL 52 12/13/2020   CHOLHDL 2.5 12/13/2020   VLDL 38 12/13/2020   LDLCALC 42 12/13/2020   LDLCALC 24 09/21/2019   Lab Results  Component Value Date   TSH 1.671 02/16/2019   TSH 2.160 08/20/2015    Therapeutic Level Labs: No results found for: "LITHIUM" No results found for: "VALPROATE" No results found for: "CBMZ"  Current Medications: Current Outpatient Medications  Medication Sig Dispense Refill   acetic acid 2 % otic solution      aspirin EC 81 MG tablet Take 81 mg by mouth daily.     bifidobacterium infantis (ALIGN) capsule Take 1 capsule by mouth daily.     blood glucose meter kit and supplies Use up to four times daily as directed, Dx E11.65, LON 99 months 1 each 0    calcium carbonate (OSCAL) 1500 (600 Ca) MG TABS tablet Take 1 tablet by mouth daily with breakfast.      Continuous Blood Gluc Receiver (FREESTYLE LIBRE 14 DAY READER) DEVI 1 Device.     Evolocumab (REPATHA SURECLICK) [id number] MG/ML SOAJ Inject 140 mg into the skin every 14 (fourteen) days. 6 mL 3   FARXIGA 10 MG TABS tablet TAKE 1 TABLET DAILY (HIGHER DOSE) 90 tablet 3   fluconazole (DIFLUCAN) 150 MG tablet PLEASE SEE ATTACHED FOR DETAILED DIRECTIONS     furosemide (LASIX) 20 MG tablet Take 20 mg by mouth daily.     glucose blood (ONE TOUCH ULTRA TEST) test strip USE UP TO FOUR TIMES A DAY AS DIRECTED; Dx 11.51, LON 99 months 300 each 3   HUMALOG KWIKPEN 100 UNIT/ML KwikPen INJECT 20 UNITS UNDER THE SKIN IN THE MORNING AND 35 UNITS WITH LUNCH AND EVENING MEAL (INJECT WITH MEALS) 90 mL 3   Insulin Pen Needle 29G X 12.7MM MISC 1 each by Does not apply route as needed. 100 each 3   PARoxetine (PAXIL) 40 MG tablet TAKE 1 TABLET EVERY MORNING 90 tablet 1   pioglitazone (ACTOS) 30 MG tablet TAKE 1 TABLET DAILY 90 tablet 0   potassium chloride (KLOR-CON) 10 MEQ tablet Take 10 mEq by mouth daily.     pregabalin (LYRICA) 25 MG capsule Take 25 mg by mouth 3 (three) times daily.     propranolol ER (INDERAL LA) 60 MG 24 hr capsule Take 60 mg by mouth daily.     propranolol ER (INDERAL LA) 80 MG 24 hr capsule Take 80 mg by mouth daily.     topiramate (TOPAMAX) 100 MG tablet Take 100 mg by mouth 2 (two) times daily.     traZODone (DESYREL) 50 MG tablet Take 1 tablet (50 mg total) by mouth at bedtime as needed for sleep. 90 tablet 0   TRESIBA FLEXTOUCH 200 UNIT/ML SOPN Inject 120 Units into the skin at bedtime. 36 pen 5   clonazePAM (KLONOPIN) 0.5 MG tablet Take 1 tablet (0.5 mg  total) by mouth as directed. Take one tablet once a day as needed for severe anxiety 21 tablet 0   lamoTRIgine (LAMICTAL) 100 MG tablet Take 1 tablet (100 mg total) by mouth 2 (two) times daily. 180 tablet 0   No current  facility-administered medications for this visit.     Musculoskeletal: Strength & Muscle Tone: within normal limits Gait & Station: normal Patient leans: N/A  Psychiatric Specialty Exam: Review of Systems  Musculoskeletal:  Positive for arthralgias.  Neurological:  Positive for tremors.  Psychiatric/Behavioral:  Positive for dysphoric mood and sleep disturbance. The patient is nervous/anxious.   All other systems reviewed and are negative.   Blood pressure 130/66, pulse 69, temperature 97.8 F (36.6 C), temperature source Skin, height 5\' 5"  (1.651 m), weight (!) 305 lb 6.4 oz (138.5 kg).Body mass index is 50.82 kg/m.  General Appearance: Casual  Eye Contact:  Fair  Speech:  Clear and Coherent  Volume:  Normal  Mood:  Anxious and Depressed  Affect:  Congruent  Thought Process:  Goal Directed and Descriptions of Associations: Intact  Orientation:  Full (Time, Place, and Person)  Thought Content: Logical   Suicidal Thoughts:  No  Homicidal Thoughts:  No  Memory:  Immediate;   Fair Recent;   Fair Remote;   Fair  Judgement:  Fair  Insight:  Fair  Psychomotor Activity:  Normal  Concentration:  Concentration: Fair and Attention Span: Fair  Recall:  [employer] of Knowledge: Fair  Language: Fair  Akathisia:  No  Handed:  Right  AIMS (if indicated): not done  Assets:  Communication Skills Desire for Improvement Housing Social Support  ADL's:  Intact  Cognition: WNL  Sleep:  Poor   Screenings: AIMS    Flowsheet Row Video Visit from 03/20/2022 in [facility] Total Score 0      [place] Office Visit from 03/10/2023 in [facility] Office Visit from 09/08/2022 in [facility] Video Visit from 05/14/2022 in [facility] Video Visit from 05/09/2021 in [facility]  Total GAD-7 Score 19 10 1 8       [place] Office Visit from 09/21/2016 in [facility]  Total Score (max 30 points ) 27      PHQ2-9    [place] Office Visit from 03/10/2023 in [place] Video Visit from 12/09/2022 in [place] Office Visit from 09/08/2022 in [place] Video Visit from 05/14/2022 in [place] Video Visit from 03/20/2022 in [place] [place]  PHQ-2 Total Score 6 1 3  0 0  PHQ-9 Total Score 20 -- 8 1 1       [place] Office Visit from 03/10/2023 in [place] Video Visit from 12/09/2022 in [place] Office Visit from 09/08/2022 in [place] No Risk No Risk No Risk        Assessment and Plan:Jaiyla A Nest is a 62 year old Caucasian female, married, disabled, lives in [place], has a history of PTSD, bipolar disorder, diabetes, OSA on CPAP, chronic pain was evaluated in office today.  Patient is currently struggling with mood symptoms, sleep problems, multiple situational stressors, will benefit  from the following plan.  Plan Bipolar disorder, mixed moderate-unstable Increase lamotrigine to 200 mg p.o. daily Paxil 40 mg p.o. daily Start trazodone 50 mg p.o. nightly as needed for sleep.   PTSD-stable Paxil 40 mg p.o. daily. Klonopin 0.5 mg as needed for severe anxiety symptoms.  Tobacco use disorder-in remission Patient quit smoking a few days ago.  Mood symptoms sleep issues likely also due to nicotine withdrawal.  Offered nicotine replacement, patient is not interested.  Patient encouraged to establish care with therapist.  Provided resources in the  community.  [name] Collaboration of Care: Collaboration of Care: Referral or follow-up with counselor/therapist AEB patient encouraged to establish care with therapist.  Patient/Guardian was advised Release of Information must be obtained prior to any record release in order to collaborate their care with an outside provider. Patient/Guardian was advised if they have not already done so to contact the registration department to sign all necessary forms in order for [place] to release information regarding their care.   Consent: Patient/Guardian gives verbal consent for treatment and assignment of benefits for services provided during this visit. Patient/Guardian expressed understanding and agreed to proceed.   Follow up in clinic in 4 weeks or sooner if needed.  This note was generated in part or whole with voice recognition software. Voice recognition is usually quite accurate but there are transcription errors that can and very often do occur. I apologize for any typographical errors that were not detected and corrected.    Ursula Alert, MD 03/11/2023, 8:49 AM

## 2023-03-19 ENCOUNTER — Other Ambulatory Visit: Payer: Self-pay | Admitting: Otolaryngology

## 2023-03-19 DIAGNOSIS — R221 Localized swelling, mass and lump, neck: Secondary | ICD-10-CM

## 2023-03-30 ENCOUNTER — Ambulatory Visit
Admission: RE | Admit: 2023-03-30 | Discharge: 2023-03-30 | Disposition: A | Discharge status: Home / Self Care | Payer: Medicare HMO | Source: Ambulatory Visit | Attending: Otolaryngology | Admitting: Otolaryngology

## 2023-03-30 DIAGNOSIS — R221 Localized swelling, mass and lump, neck: Secondary | ICD-10-CM

## 2023-04-13 ENCOUNTER — Telehealth (INDEPENDENT_AMBULATORY_CARE_PROVIDER_SITE_OTHER): Payer: Medicare HMO | Admitting: Psychiatry

## 2023-04-13 ENCOUNTER — Encounter: Payer: Self-pay | Admitting: Psychiatry

## 2023-04-13 DIAGNOSIS — F17200 Nicotine dependence, unspecified, uncomplicated: Secondary | ICD-10-CM

## 2023-04-13 DIAGNOSIS — F3178 Bipolar disorder, in full remission, most recent episode mixed: Secondary | ICD-10-CM

## 2023-04-13 DIAGNOSIS — F17210 Nicotine dependence, cigarettes, uncomplicated: Secondary | ICD-10-CM

## 2023-04-13 DIAGNOSIS — F4310 Post-traumatic stress disorder, unspecified: Secondary | ICD-10-CM | POA: Diagnosis not present

## 2023-04-13 NOTE — Progress Notes (Signed)
Virtual Visit via Video Note  I connected with Debra [name] on 04/13/23 at 11:30 AM EDT by a video enabled telemedicine application and verified that I am speaking with the correct person using two identifiers.  Location Provider Location : [facility] Patient Location : Home  Participants: Patient , Provider   I discussed the limitations of evaluation and management by telemedicine and the availability of in person appointments. The patient expressed understanding and agreed to proceed.   I discussed the assessment and treatment plan with the patient. The patient was provided an opportunity to ask questions and all were answered. The patient agreed with the plan and demonstrated an understanding of the instructions.   The patient was advised to call back or seek an in-person evaluation if the symptoms worsen or if the condition fails to improve as anticipated.    BH MD OP Progress Note  04/13/2023 2:14 PM Debra [name]  MRN:  [id number]  Chief Complaint:  Chief Complaint  Patient presents with   Follow-up   Anxiety   Depression   Medication Refill   HPI: Debra [name] is a 62 year old Caucasian female, married, disabled, lives in [place], has a history of bipolar disorder, PTSD, tobacco use disorder, Parkinsonian tremors, chronic back pain, hypertension was evaluated by telemedicine today.  Patient today reports she is currently doing better with regards to her mood symptoms on the higher dosage of lamotrigine.  She reports she takes the whole dosage in the morning and that works better for her than dividing it up.  Patient reports sleep has improved on the trazodone.  She could not tolerate the higher dose of trazodone 50 mg.  She reports she felt groggy when she woke up in the morning after taking trazodone 50 mg.  She hence has been taking 1/2 tablet of the 50 mg, 25 mg at bedtime.  That does seem to help with the sleep.  She reports she has been making use of strategies to cope with her  stressors at home.  She has set clear boundaries and has been practicing on being assertive with her roommate.  That does seem to help with sharing chores at home.  Patient also has been spending her time going out as well as focusing on her crafts.  Patient denies any suicidality, homicidality or perceptual disturbances.  Patient currently compliant on medications.  Limiting the use of clonazepam, may have used 1 dose since her last visit.   Patient denies any other concerns today.  Visit Diagnosis:    ICD-10-CM   1. Bipolar disorder, in full remission, most recent episode mixed (HCC)  F31.78    Type I    2. PTSD (post-traumatic stress disorder)  F43.10     3. Tobacco use disorder  F17.200       Past Psychiatric History: I have reviewed past psychiatric history from progress note on 02/16/2019.  Past Medical History:  Past Medical History:  Diagnosis Date   Anxiety    Arthritis    C. difficile colitis    C. difficile colitis    Depression    Diabetes mellitus without complication (HCC)    Type II   Diabetes mellitus, type II (HCC)    Family history of adverse reaction to anesthesia    Mother - BP drops   Head injury, closed, with brief LOC (HCC) 2011ish   Hyperlipidemia    Hypertension    Insomnia    Myalgia 03/29/2018   Due to statin  Obesity    Parkinson's disease    Seasonal allergies    Seizures (HCC)    Tobacco abuse     Past Surgical History:  Procedure Laterality Date   ABDOMINAL HYSTERECTOMY  2012   BREAST BIOPSY Right    CESAREAN SECTION  1992   CHOLECYSTECTOMY  2013   LUMBAR LAMINECTOMY/DECOMPRESSION MICRODISCECTOMY Left 09/10/2015   Procedure: Left L4-5 Foraminotomy/Left L5-S1 Diskectomy;  Surgeon: Hilda Lias, MD;  Location: [facility];  Service: Neurosurgery;  Laterality: Left;  Left L4-5 Foraminotomy/Left L5-S1 Diskectomy    Family Psychiatric History: I have reviewed family history from progress note on 02/16/2019.  Family History:   Family History  Problem Relation Age of Onset   COPD Mother    Bipolar disorder Father    Diabetes Mellitus II Father    Bipolar disorder Brother    Diabetes Mellitus II Brother    Bipolar disorder Brother     Social History: Reviewed social history from progress note on 02/16/2019. Social History   Socioeconomic History   Marital status: Married    Spouse name: Fayrene Fearing   Number of children: 1   Years of education: Not on file   Highest education level: Associate degree: occupational, Scientist, product/process development, or vocational program  Occupational History   Occupation: Disabled  Tobacco Use   Smoking status: Every Day    Packs/day: 0.50    Years: 24.00    Additional pack years: 0.00    Total pack years: 12.00    Types: Cigarettes    Start date: 03/19/2004   Smokeless tobacco: Never  Vaping Use   Vaping Use: Never used  Substance and Sexual Activity   Alcohol use: Yes    Alcohol/week: 0.0 standard drinks of alcohol    Comment: occasional- rare   Drug use: No   Sexual activity: Not Currently  Other Topics Concern   Not on file  Social History Narrative   Live in private residence w/ husband and their friend who is wheelchair bound, her end of the house is wheelchair accessible.    Social Determinants of Health   Financial Resource Strain: Low Risk  (06/13/2019)   Overall Financial Resource Strain (CARDIA)    Difficulty of Paying Living Expenses: Not hard at all  Food Insecurity: Food Insecurity Present (06/13/2019)   Hunger Vital Sign    Worried About Running Out of Food in the Last Year: Sometimes true    Ran Out of Food in the Last Year: Never true  Transportation Needs: No Transportation Needs (05/12/2018)   PRAPARE - Administrator, Civil Service (Medical): No    Lack of Transportation (Non-Medical): No  Physical Activity: Inactive (05/12/2018)   Exercise Vital Sign    Days of Exercise per Week: 0 days    Minutes of Exercise per Session: 0 min  Stress: Stress Concern  Present (06/13/2019)   [employer] of Occupational Health - Occupational Stress Questionnaire    Feeling of Stress : To some extent  Social Connections: Unknown (06/13/2019)   Social Connection and Isolation Panel [NHANES]    Frequency of Communication with Friends and Family: More than three times a week    Frequency of Social Gatherings with Friends and Family: Not on file    Attends Religious Services: Not on file    Active Member of Clubs or Organizations: Not on file    Attends Banker Meetings: Not on file    Marital Status: Not on file    Allergies:  Allergies  Allergen Reactions   Clindamycin/Lincomycin     Pt states caused C-Diff   Montelukast Hives and Rash    Asthma attacks Asthma attacks   Primidone     Diffused erythematous and itchy rash   Sulfasalazine Hives    Other reaction(s): Unknown   Metformin And Related Diarrhea   Benadryl [Diphenhydramine Hcl] Swelling    blistering   Canagliflozin Other (See Comments)    Other reaction(s): Other (See Comments) Low bp Low bp Low bp   Depakote Er [Divalproex Sodium Er] Other (See Comments)    seizures   Dilaudid [Hydromorphone Hcl] Other (See Comments)    seizures   Diphenhydramine-Zinc Acetate Other (See Comments)   Erythromycin    Ethanol    Haloperidol Other (See Comments)   Hydromorphone Other (See Comments)   Neosporin [Neomycin-Bacitracin Zn-Polymyx] Swelling    blistering   Singulair [Montelukast Sodium] Other (See Comments)    Asthma attacks   Sitagliptin Other (See Comments)   Statins Other (See Comments)    Other reaction(s): Unknown   Sulfa Antibiotics Hives    Other reaction(s): Unknown   Victoza [Liraglutide] Other (See Comments) and Nausea And Vomiting    Other reaction(s): Other (See Comments) pancreatitis pancreatitis    Metabolic Disorder Labs: Lab Results  Component Value Date   HGBA1C 8.3 (H) 09/21/2019   MPG 192 09/21/2019   MPG 214 06/16/2019   No results found  for: "PROLACTIN" Lab Results  Component Value Date   CHOL 132 12/13/2020   TRIG 190 (H) 12/13/2020   HDL 52 12/13/2020   CHOLHDL 2.5 12/13/2020   VLDL 38 12/13/2020   LDLCALC 42 12/13/2020   LDLCALC 24 09/21/2019   Lab Results  Component Value Date   TSH 1.671 02/16/2019   TSH 2.160 08/20/2015    Therapeutic Level Labs: No results found for: "LITHIUM" No results found for: "VALPROATE" No results found for: "CBMZ"  Current Medications: Current Outpatient Medications  Medication Sig Dispense Refill   acetic acid 2 % otic solution      aspirin EC 81 MG tablet Take 81 mg by mouth daily.     bifidobacterium infantis (ALIGN) capsule Take 1 capsule by mouth daily.     blood glucose meter kit and supplies Use up to four times daily as directed, Dx E11.65, LON 99 months 1 each 0   calcium carbonate (OSCAL) 1500 (600 Ca) MG TABS tablet Take 1 tablet by mouth daily with breakfast.      clonazePAM (KLONOPIN) 0.5 MG tablet Take 1 tablet (0.5 mg total) by mouth as directed. Take one tablet once a day as needed for severe anxiety 21 tablet 0   Continuous Blood Gluc Receiver (FREESTYLE LIBRE 14 DAY READER) DEVI 1 Device.     FARXIGA 10 MG TABS tablet TAKE 1 TABLET DAILY (HIGHER DOSE) 90 tablet 3   fluconazole (DIFLUCAN) 150 MG tablet PLEASE SEE ATTACHED FOR DETAILED DIRECTIONS     furosemide (LASIX) 20 MG tablet Take 20 mg by mouth daily.     glucose blood (ONE TOUCH ULTRA TEST) test strip USE UP TO FOUR TIMES A DAY AS DIRECTED; Dx 11.51, LON 99 months 300 each 3   HUMALOG KWIKPEN 100 UNIT/ML KwikPen INJECT 20 UNITS UNDER THE SKIN IN THE MORNING AND 35 UNITS WITH LUNCH AND EVENING MEAL (INJECT WITH MEALS) 90 mL 3   Insulin Pen Needle 29G X 12.7MM MISC 1 each by Does not apply route as needed. 100 each 3   lamoTRIgine (  LAMICTAL) 100 MG tablet Take 1 tablet (100 mg total) by mouth 2 (two) times daily. 180 tablet 0   PARoxetine (PAXIL) 40 MG tablet TAKE 1 TABLET EVERY MORNING 90 tablet 1    pioglitazone (ACTOS) 30 MG tablet TAKE 1 TABLET DAILY 90 tablet 0   potassium chloride (KLOR-CON) 10 MEQ tablet Take 10 mEq by mouth daily.     pregabalin (LYRICA) 25 MG capsule Take 25 mg by mouth 3 (three) times daily.     propranolol ER (INDERAL LA) 60 MG 24 hr capsule Take 60 mg by mouth daily.     topiramate (TOPAMAX) 100 MG tablet Take 100 mg by mouth 2 (two) times daily.     traZODone (DESYREL) 50 MG tablet Take 1 tablet (50 mg total) by mouth at bedtime as needed for sleep. 90 tablet 0   TRESIBA FLEXTOUCH 200 UNIT/ML SOPN Inject 120 Units into the skin at bedtime. 36 pen 5   No current facility-administered medications for this visit.     Musculoskeletal: Strength & Muscle Tone:  UTA Gait & Station:  Seated Patient leans: N/A  Psychiatric Specialty Exam: Review of Systems  Musculoskeletal:        Hip pain - lower back pain   Psychiatric/Behavioral:  The patient is nervous/anxious.   All other systems reviewed and are negative.   There were no vitals taken for this visit.There is no height or weight on file to calculate BMI.  General Appearance: Casual  Eye Contact:  Fair  Speech:  Normal Rate  Volume:  Normal  Mood:  Anxious  Affect:  Appropriate  Thought Process:  Goal Directed and Descriptions of Associations: Intact  Orientation:  Full (Time, Place, and Person)  Thought Content: Logical   Suicidal Thoughts:  No  Homicidal Thoughts:  No  Memory:  Immediate;   Fair Recent;   Fair Remote;   Fair  Judgement:  Fair  Insight:  Fair  Psychomotor Activity:  Normal  Concentration:  Concentration: Fair and Attention Span: Fair  Recall:  [employer] of Knowledge: Fair  Language: Fair  Akathisia:  No  Handed:  Right  AIMS (if indicated): not done  Assets:  Communication Skills Desire for Improvement Housing Intimacy Social Support  ADL's:  Intact  Cognition: WNL  Sleep:   improving   Screenings: AIMS    Flowsheet Row Video Visit from 03/20/2022 in [facility]  AIMS Total Score 0      GAD-7    Flowsheet Row Office Visit from 03/10/2023 in [facility] Office Visit from 09/08/2022 in [facility] Video Visit from 05/14/2022 in [facility] Video Visit from 05/09/2021 in [facility]  Total GAD-7 Score 19 10 1 8       Mini-Mental    Flowsheet Row Office Visit from 09/21/2016 in [facility]  Total Score (max 30 points ) 27      PHQ2-9    Flowsheet Row Video Visit from 04/13/2023 in [facility] Office Visit from 03/10/2023 in [facility] Video Visit from 12/09/2022 in [facility] Office Visit from 09/08/2022 in [facility] Video Visit from 05/14/2022 in [facility]  PHQ-2 Total Score 0 6 1 3  0  PHQ-9 Total Score -- 20 -- 8 1  Flowsheet Row Video Visit from 04/13/2023 in [facility] Office Visit from 03/10/2023 in [facility] Video Visit from 12/09/2022 in [facility]  C-SSRS RISK CATEGORY Low Risk No Risk No Risk        Assessment and Plan: Debra [name] is a 62 year old Caucasian female, married, disabled, lives in [place], has a history of PTSD, bipolar disorder, diabetes, OSA on CPAP, chronic pain was evaluated by telemedicine today.  Patient is currently improving, will benefit from following plan.  Plan Bipolar disorder in remission mixed moderate Lamotrigine 200 mg p.o. daily Paxil 40 mg p.o. daily Reduce trazodone 25 mg p.o. nightly as needed.  Higher dosage made her groggy.  PTSD-stable Paxil 40 mg  p.o. daily Klonopin 0.5 mg as needed for severe anxiety. Reviewed [place] PMP AWARxE   Tobacco use disorder in remission Patient quit smoking.  Follow-up in clinic in 3 months or sooner if needed.  Consent: Patient/Guardian gives verbal consent for treatment and assignment of benefits for services provided during this visit. Patient/Guardian expressed understanding and agreed to proceed.  This note was generated in part or whole with voice recognition software. Voice recognition is usually quite accurate but there are transcription errors that can and very often do occur. I apologize for any typographical errors that were not detected and corrected.     Jomarie Longs, MD 04/13/2023, 2:14 PM

## 2023-04-16 ENCOUNTER — Other Ambulatory Visit: Payer: Self-pay | Admitting: Psychiatry

## 2023-04-16 DIAGNOSIS — F3178 Bipolar disorder, in full remission, most recent episode mixed: Secondary | ICD-10-CM

## 2023-04-28 ENCOUNTER — Other Ambulatory Visit (HOSPITAL_COMMUNITY): Payer: Self-pay

## 2023-04-28 ENCOUNTER — Telehealth: Payer: Self-pay

## 2023-04-28 MED ORDER — REPATHA SURECLICK 140 MG/ML ~~LOC~~ SOAJ: 140.0000 mg | SUBCUTANEOUS | 3 refills | Status: DC

## 2023-04-28 NOTE — Addendum Note (Signed)
Addended by: Logyn Dedominicis E on: 04/28/2023 01:38 PM   Modules accepted: Orders

## 2023-04-28 NOTE — Telephone Encounter (Signed)
Pharmacy Patient Advocate Encounter  Prior Authorization for REPATHA has been approved.    Effective dates: 01/07/23 through 12/07/23   Cybil Senegal, CPhT Pharmacy Patient Advocate Specialist Direct Number: [phone] Fax: [phone] 

## 2023-04-28 NOTE — Telephone Encounter (Signed)
Pharmacy Patient Advocate Encounter   Received notification from [facility] MEDICARE that prior authorization for REPATHA is needed.    PA submitted on 04/28/23 Key [id number] Status is pending  Haze Rushing, CPhT Pharmacy Patient Advocate Specialist Direct Number: [phone] Fax: [phone]

## 2023-05-05 ENCOUNTER — Other Ambulatory Visit: Payer: Self-pay | Admitting: Family Medicine

## 2023-05-05 DIAGNOSIS — Z1231 Encounter for screening mammogram for malignant neoplasm of breast: Secondary | ICD-10-CM

## 2023-05-25 ENCOUNTER — Ambulatory Visit
Admission: RE | Admit: 2023-05-25 | Discharge: 2023-05-25 | Disposition: A | Discharge status: Home / Self Care | Payer: Medicare HMO | Source: Ambulatory Visit | Attending: Family Medicine | Admitting: Family Medicine

## 2023-05-25 DIAGNOSIS — Z1231 Encounter for screening mammogram for malignant neoplasm of breast: Secondary | ICD-10-CM | POA: Diagnosis present

## 2023-05-28 ENCOUNTER — Encounter: Payer: Self-pay | Admitting: Surgery

## 2023-05-28 ENCOUNTER — Telehealth: Payer: Self-pay

## 2023-05-28 ENCOUNTER — Ambulatory Visit: Payer: Medicare HMO | Admitting: Surgery

## 2023-05-28 VITALS — BP 158/87 | HR 74 | Temp 98.8°F | Ht 65.5 in | Wt 303.0 lb

## 2023-05-28 DIAGNOSIS — D170 Benign lipomatous neoplasm of skin and subcutaneous tissue of head, face and neck: Secondary | ICD-10-CM

## 2023-05-28 NOTE — Telephone Encounter (Signed)
Faxed medical clearance to Dr. Cheri Kearns at [phone].

## 2023-05-28 NOTE — Progress Notes (Signed)
05/28/2023  Reason for Visit:  Left neck lipoma  Requesting Provider:  Bud Face, MD  History of Present Illness: Debra [name] is a 62 y.o. female presenting for evaluation of a left upper neck mass.  The patient reports that she's had this for a long time but has been getting bigger and with the increased size, her CPAP mask strap hits it more and becomes more uncomfortable.  Denies any drainage from the area, redness of the skin.  She does report that this pain getting bigger.  Ultrasound of the neck on 03/30/2023 but this did not show any specific masses.  Past Medical History: Past Medical History:  Diagnosis Date   Anxiety    Arthritis    C. difficile colitis    C. difficile colitis    Depression    Diabetes mellitus without complication (HCC)    Type II   Diabetes mellitus, type II (HCC)    Family history of adverse reaction to anesthesia    Mother - BP drops   Head injury, closed, with brief LOC (HCC) 2011ish   Hyperlipidemia    Hypertension    Insomnia    Myalgia 03/29/2018   Due to statin   Obesity    Parkinson's disease    Seasonal allergies    Seizures (HCC)    Tobacco abuse      Past Surgical History: Past Surgical History:  Procedure Laterality Date   ABDOMINAL HYSTERECTOMY  2012   BREAST BIOPSY Right    CESAREAN SECTION  1992   CHOLECYSTECTOMY  2013   LUMBAR LAMINECTOMY/DECOMPRESSION MICRODISCECTOMY Left 09/10/2015   Procedure: Left L4-5 Foraminotomy/Left L5-S1 Diskectomy;  Surgeon: Hilda Lias, MD;  Location: [facility];  Service: Neurosurgery;  Laterality: Left;  Left L4-5 Foraminotomy/Left L5-S1 Diskectomy    Home Medications: Prior to Admission medications   Medication Sig Start Date End Date Taking? Authorizing Provider  acetic acid 2 % otic solution  08/04/22  Yes [provider]  aspirin EC 81 MG tablet Take 81 mg by mouth daily.   Yes [provider]  bifidobacterium infantis (ALIGN) capsule Take 1 capsule by mouth  daily.   Yes [provider]  blood glucose meter kit and supplies Use up to four times daily as directed, Dx E11.65, LON 99 months 05/18/18  Yes Lada, Janit Bern, MD  calcium carbonate (OSCAL) 1500 (600 Ca) MG TABS tablet Take 1 tablet by mouth daily with breakfast.    Yes [provider]  Continuous Blood Gluc Receiver (FREESTYLE LIBRE 14 DAY READER) DEVI 1 Device. 10/27/17  Yes [provider]  Evolocumab (REPATHA SURECLICK) 140 MG/ML SOAJ Inject 140 mg into the skin every 14 (fourteen) days. 04/28/23  Yes Gollan, Tollie Pizza, MD  FARXIGA 10 MG TABS tablet TAKE 1 TABLET DAILY (HIGHER DOSE) 07/03/19  Yes Poulose, Percell Belt, NP  fluconazole (DIFLUCAN) 150 MG tablet PLEASE SEE ATTACHED FOR DETAILED DIRECTIONS 02/17/21  Yes [provider]  furosemide (LASIX) 20 MG tablet Take 20 mg by mouth daily. 06/06/21  Yes [provider]  glucose blood (ONE TOUCH ULTRA TEST) test strip USE UP TO FOUR TIMES A DAY AS DIRECTED; Dx 11.51, LON 99 months 03/20/19  Yes Lada, Janit Bern, MD  HUMALOG KWIKPEN 100 UNIT/ML KwikPen INJECT 20 UNITS UNDER THE SKIN IN THE MORNING AND 35 UNITS WITH LUNCH AND EVENING MEAL (INJECT WITH MEALS) 05/20/20  Yes Danelle Berry, PA-C  Insulin Pen Needle 29G X 12.7MM MISC 1 each by Does not  apply route as needed. 04/03/19  Yes Doren Custard, FNP  lamoTRIgine (LAMICTAL) 100 MG tablet Take 1 tablet (100 mg total) by mouth 2 (two) times daily. 03/10/23  Yes Jomarie Longs, MD  PARoxetine (PAXIL) 40 MG tablet TAKE ONE TABLET BY MOUTH EVERY MORNING 04/16/23  Yes Eappen, Levin Bacon, MD  pioglitazone (ACTOS) 30 MG tablet TAKE 1 TABLET DAILY 02/15/20  Yes Danelle Berry, PA-C  potassium chloride (KLOR-CON) 10 MEQ tablet Take 10 mEq by mouth daily. 09/03/22  Yes [provider]  pregabalin (LYRICA) 25 MG capsule Take 25 mg by mouth 3 (three) times daily. 01/14/22  Yes [provider]  propranolol ER (INDERAL LA) 60 MG 24 hr capsule Take 60 mg by mouth  daily. 09/03/22  Yes [provider]  topiramate (TOPAMAX) 100 MG tablet Take 100 mg by mouth 2 (two) times daily. 12/08/22  Yes [provider]  traZODone (DESYREL) 50 MG tablet Take 1 tablet (50 mg total) by mouth at bedtime as needed for sleep. 03/10/23  Yes Eappen, Levin Bacon, MD  TRESIBA FLEXTOUCH 200 UNIT/ML SOPN Inject 120 Units into the skin at bedtime. 03/09/19  Yes Lada, Janit Bern, MD  clonazePAM (KLONOPIN) 0.5 MG tablet Take 1 tablet (0.5 mg total) by mouth as directed. Take one tablet once a day as needed for severe anxiety 03/10/23 04/09/23  Jomarie Longs, MD    Allergies: Allergies  Allergen Reactions   Clindamycin/Lincomycin     Pt states caused C-Diff   Montelukast Hives and Rash    Asthma attacks Asthma attacks   Primidone     Diffused erythematous and itchy rash   Sulfasalazine Hives    Other reaction(s): Unknown   Metformin And Related Diarrhea   Benadryl [Diphenhydramine Hcl] Swelling    blistering   Canagliflozin Other (See Comments)    Other reaction(s): Other (See Comments) Low bp Low bp Low bp   Depakote Er [Divalproex Sodium Er] Other (See Comments)    seizures   Dilaudid [Hydromorphone Hcl] Other (See Comments)    seizures   Diphenhydramine-Zinc Acetate Other (See Comments)   Erythromycin    Ethanol    Haloperidol Other (See Comments)   Hydromorphone Other (See Comments)   Neosporin [Neomycin-Bacitracin Zn-Polymyx] Swelling    blistering   Singulair [Montelukast Sodium] Other (See Comments)    Asthma attacks   Sitagliptin Other (See Comments)   Statins Other (See Comments)    Other reaction(s): Unknown   Sulfa Antibiotics Hives    Other reaction(s): Unknown   Victoza [Liraglutide] Other (See Comments) and Nausea And Vomiting    Other reaction(s): Other (See Comments) pancreatitis pancreatitis    Social History:  reports that she has been smoking cigarettes. She started smoking about 19 years ago. She has a 12.00 pack-year smoking  history. She has never used smokeless tobacco. She reports current alcohol use. She reports that she does not use drugs.   Family History: Family History  Problem Relation Age of Onset   COPD Mother    Bipolar disorder Father    Diabetes Mellitus II Father    Bipolar disorder Brother    Diabetes Mellitus II Brother    Bipolar disorder Brother    Breast cancer Neg Hx     Review of Systems: Review of Systems  Constitutional:  Negative for chills and fever.  Respiratory:  Negative for shortness of breath.   Cardiovascular:  Negative for chest pain.  Gastrointestinal:  Negative for abdominal pain, nausea and vomiting.  Skin:  Negative  for rash.       Left upper posterior neck mass    Physical Exam BP (!) 158/87   Pulse 74   Temp 98.8 F (37.1 C) (Oral)   Ht 5' 5.5" (1.664 m)   Wt (!) 303 lb (137.4 kg)   SpO2 97%   BMI 49.65 kg/m  CONSTITUTIONAL: No acute distress HEENT:  Normocephalic, atraumatic, extraocular motion intact. RESPIRATORY:  Lungs are clear, and breath sounds are equal bilaterally. Normal respiratory effort without pathologic use of accessory muscles. CARDIOVASCULAR: Heart is regular without murmurs, gallops, or rubs. MUSCULOSKELETAL:  Normal muscle strength and tone in all four extremities.  No peripheral edema or cyanosis. SKIN: Patient has an approximately 5 cm mass in the left upper posterior neck close to the posterior auricular location.  This area is soft, somewhat mobile, nontender to palpation without any overlying skin erythema or other changes. NEUROLOGIC:  Motor and sensation is grossly normal.  Cranial nerves are grossly intact. PSYCH:  Alert and oriented to person, place and time. Affect is normal.  Laboratory Analysis: No results found for this or any previous visit (from the past 24 hour(s)).  Imaging: Ultrasound soft tissue neck on 03/30/2023: FINDINGS: Focused sonographic exam of the left posterior neck was performed in the area of interest.  Images demonstrate normal appearance of the subcutaneous soft tissues without anatomic abnormality identified.   IMPRESSION: No abnormality identified in the left posterior neck soft tissues.  Assessment and Plan: This is a 62 y.o. female with a left posterior upper neck mass.  - Discussed with the patient that even though the ultrasound does not show a specific abnormality, on exam the patient clearly has a mass in the left upper posterior neck which is very different compared to the right upper posterior neck.  Although this potentially could represent excess fatty tissue given there was no specific abnormality on the ultrasound, this area still causes the patient discomfort due to the CPAP mask strap interfering and pushing on that area.  As such, I think it is reasonable to proceed with excision of this area.  Discussed with patient that given the location and size, I think this would be best to be done in the operating room under anesthesia and better control.  She is in agreement. - Discussed then with the patient the plan for an excision of the left upper posterior neck lipoma.  Reviewed with her the surgery at length including the planned incision, risks of bleeding, infection, injury to surrounding structures, that this would be an outpatient procedure, postoperative pain control, and she is willing to proceed. - Tentatively we can schedule her surgery for 06/08/2023.  Her last dose of aspirin would be on 06/02/2023.  Will also obtain medical clearance.  All of her questions have been answered.  I spent 30 minutes dedicated to the care of this patient on the date of this encounter to include pre-visit review of records, face-to-face time with the patient discussing diagnosis and management, and any post-visit coordination of care.   Howie Ill, MD [facility]

## 2023-05-28 NOTE — Patient Instructions (Signed)
Our surgery scheduler Barbara will call you within 24-48 hours to get you scheduled. If you have not heard from her after 48 hours, please call our office. Have the blue sheet available when she calls to write down important information.  If you have any concerns or questions, please feel free to call our office.    Lipoma Removal   Lipoma removal is a surgical procedure to remove a lipoma, which is a noncancerous (benign) tumor that is made up of fat cells. Most lipomas are small and painless and do not require treatment. They can form in many areas of the body but are most common under the skin of the back, arms, shoulders, buttocks, and thighs. You may need lipoma removal if you have a lipoma that is large, growing, or causing discomfort. Lipoma removal may also be done for cosmetic reasons. Tell a health care provider about: Any allergies you have. All medicines you are taking, including vitamins, herbs, eye drops, creams, and over-the-counter medicines. Any problems you or family members have had with anesthetic medicines. Any bleeding problems you have. Any surgeries you have had. Any medical conditions you have. Whether you are pregnant or may be pregnant. What are the risks? Generally, this is a safe procedure. However, problems may occur, including: Infection. Bleeding. Scarring. Allergic reactions to medicines. Damage to nearby structures or organs, such as damage to nerves or blood vessels near the lipoma. What happens before the procedure? When to Stop Eating and Drinking Follow instructions from your health care provider about what you may eat and drink before your procedure. These may include: 8 hours before your procedure Stop eating most foods. Do not eat meat, fried foods, or fatty foods. Eat only light foods, such as toast or crackers. All liquids are okay except energy drinks and alcohol. 6 hours before your procedure Stop eating. Drink only clear liquids, such as  water, clear fruit juice, black coffee, plain tea, and sports drinks. Do not drink energy drinks or alcohol. 2 hours before your procedure Stop drinking all liquids. You may be allowed to take medicines with small sips of water. If you do not follow your health care provider's instructions, your procedure may be delayed or canceled. Medicines Ask your health care provider about: Changing or stopping your regular medicines. This is especially important if you are taking diabetes medicines or blood thinners. Taking medicines such as aspirin and ibuprofen. These medicines can thin your blood. Do not take these medicines unless your health care provider tells you to take them. Taking over-the-counter medicines, vitamins, herbs, and supplements. General instructions You will have a physical exam. Your health care provider will check the size of the lipoma and whether it can be removed easily. You may have a biopsy and imaging tests, such as X-rays, a CT scan, and an MRI. Do not use any products that contain nicotine or tobacco for at least 4 weeks before the procedure. These products include cigarettes, chewing tobacco, and vaping devices, such as e-cigarettes. If you need help quitting, ask your health care provider. Ask your health care provider: How your surgery site will be marked. What steps will be taken to help prevent infection. These may include: Washing skin with a germ-killing soap. Taking antibiotic medicine. If you will be going home right after the procedure, plan to have a responsible adult: Take you home from the hospital or clinic. You will not be allowed to drive. Care for you for the time you are told. What happens   during the procedure?  An IV will be inserted into one of your veins. You will be given one or more of the following: A medicine to help you relax (sedative). A medicine to numb the area (local anesthetic). A medicine to make you fall asleep (general  anesthetic). A medicine that is injected into an area of your body to numb everything below the injection site (regional anesthetic). An incision will be made into the skin over the lipoma or very near the lipoma. The incision may be made in a natural skin line or crease. Tissues, nerves, and blood vessels near the lipoma will be moved out of the way. The lipoma and the capsule that surrounds it will be separated from the surrounding tissues. The lipoma will be removed. The incision may be closed with stitches (sutures). A bandage (dressing) will be placed over the incision. The procedure may vary among health care providers and hospitals. What happens after the procedure? Your blood pressure, heart rate, breathing rate, and blood oxygen level will be monitored until you leave the hospital or clinic. If you were prescribed an antibiotic medicine, use it as told by your health care provider. Do not stop using the antibiotic even if you start to feel better. If you were given a sedative during the procedure, it can affect you for several hours. Do not drive or operate machinery until your health care provider says that it is safe. Where to find more information OrthoInfo: orthoinfo.aaos.org Summary Before the procedure, follow instructions from your health care provider about eating and drinking, and changing or stopping your regular medicines. This is especially important if you are taking diabetes medicines or blood thinners. After the lipoma is removed, the incision may be closed with stitches (sutures) and covered with a bandage (dressing). If you were given a sedative during the procedure, it can affect you for several hours. Do not drive or operate machinery until your health care provider says that it is safe. This information is not intended to replace advice given to you by your health care provider. Make sure you discuss any questions you have with your health care provider. Document  Revised: 12/12/2021 Document Reviewed: 12/12/2021 Elsevier Patient Education  2024 [employer].   

## 2023-05-31 ENCOUNTER — Telehealth: Payer: Self-pay | Admitting: Surgery

## 2023-05-31 NOTE — Telephone Encounter (Signed)
Patient calls back, she is now informed of all dates regarding surgery.  

## 2023-05-31 NOTE — Telephone Encounter (Signed)
Left message for patient to call, please inform her of the following regarding scheduled surgery with Dr. Aleen Campi.    Pre-Admission date/time, and Surgery date at [facility].  Surgery Date: 7/2//24 Preadmission Testing Date: 06/02/23 (phone 8a-1p)  Also patient will need to call at [phone], between 1-3:00pm the day before surgery, to find out what time to arrive for surgery.

## 2023-06-02 ENCOUNTER — Encounter (HOSPITAL_COMMUNITY): Payer: Self-pay | Admitting: Urgent Care

## 2023-06-02 ENCOUNTER — Encounter
Admission: RE | Admit: 2023-06-02 | Discharge: 2023-06-02 | Disposition: A | Discharge status: Home / Self Care | Payer: Medicare HMO | Source: Ambulatory Visit | Attending: Surgery | Admitting: Surgery

## 2023-06-02 ENCOUNTER — Other Ambulatory Visit: Payer: Self-pay

## 2023-06-02 ENCOUNTER — Telehealth: Payer: Self-pay | Admitting: *Deleted

## 2023-06-02 ENCOUNTER — Encounter: Payer: Self-pay | Admitting: Surgery

## 2023-06-02 DIAGNOSIS — Z01810 Encounter for preprocedural cardiovascular examination: Secondary | ICD-10-CM

## 2023-06-02 DIAGNOSIS — Z01812 Encounter for preprocedural laboratory examination: Secondary | ICD-10-CM

## 2023-06-02 DIAGNOSIS — Z01818 Encounter for other preprocedural examination: Secondary | ICD-10-CM | POA: Insufficient documentation

## 2023-06-02 DIAGNOSIS — E119 Type 2 diabetes mellitus without complications: Secondary | ICD-10-CM | POA: Insufficient documentation

## 2023-06-02 DIAGNOSIS — I10 Essential (primary) hypertension: Secondary | ICD-10-CM | POA: Diagnosis not present

## 2023-06-02 HISTORY — DX: Pneumonia, unspecified organism: J18.9

## 2023-06-02 LAB — BASIC METABOLIC PANEL
Anion gap: 9 (ref 5–15)
BUN: 19 mg/dL (ref 6–20)
CO2: 20 mmol/L — ABNORMAL LOW (ref 22–32)
Calcium: 8.7 mg/dL — ABNORMAL LOW (ref 8.9–10.3)
Chloride: 110 mmol/L (ref 98–111)
Creatinine, Ser: 0.88 mg/dL (ref 0.44–1.00)
GFR, Estimated: >60 mL/min (ref >60)
Glucose, Bld: 226 mg/dL — ABNORMAL HIGH (ref 70–99)
Potassium: 3.5 mmol/L (ref 3.5–5.1)
Sodium: 139 mmol/L (ref 135–145)

## 2023-06-02 LAB — CBC
HCT: 45.3 % (ref 36.0–46.0)
Hemoglobin: 15 g/dL (ref 12.0–15.0)
MCH: 32.3 pg (ref 26.0–34.0)
MCHC: 33.1 g/dL (ref 30.0–36.0)
MCV: 97.4 fL (ref 80.0–100.0)
Platelets: 249 10*3/uL (ref 150–400)
RBC: 4.65 MIL/uL (ref 3.87–5.11)
RDW: 13.2 % (ref 11.5–15.5)
WBC: 7.4 10*3/uL (ref 4.0–10.5)
nRBC: 0 % (ref 0.0–0.2)

## 2023-06-02 NOTE — Telephone Encounter (Addendum)
   Name: SERAH NICOLETTI  DOB: 1961-11-01  MRN: [id number]  Primary Cardiologist: Julien Nordmann, MD  Chart reviewed as part of pre-operative protocol coverage. Because of Aronda A Peatross's past medical history and time since last visit, she will require a follow-up in-office visit in order to better assess preoperative cardiovascular risk.   Pre-op covering staff: - Please schedule appointment and call patient to inform them. If patient already had an upcoming appointment within acceptable timeframe, please add "pre-op clearance" to the appointment notes so provider is aware. - Please contact requesting surgeon's office via preferred method (i.e, phone, fax) to inform them of need for appointment prior to surgery.  -Please advise patient to hold ASA 81 mg starting today if procedure is scheduled for 06/08/2023.  Napoleon Form, Leodis Rains, NP  06/02/2023, 11:01 AM

## 2023-06-02 NOTE — Telephone Encounter (Signed)
   Patient Name: Debra [name]  DOB: [birth year] MRN: [id number]  Primary Cardiologist: Julien Nordmann, MD  Chart reviewed as part of pre-operative protocol coverage.   Preop team please contact patient and advise her to stop aspirin effective today to achieve 5 to 7-day hold time.  Thanks,   Napoleon Form, Leodis Rains, NP 06/02/2023, 12:05 PM

## 2023-06-02 NOTE — Telephone Encounter (Signed)
Per pre op APP I left a message ok to hold her ASA beginning today. Left detailed message if she has taken her ASA today then to be sure to not take anymore until her procedure, which then the surgeon will advise as to when to resume.

## 2023-06-02 NOTE — Telephone Encounter (Signed)
Our office just received a request for pre op clearance for procedure to be done 06/08/23. Pt is going to need an IN OFFICE appt. I am going to need to reach out to our [place] location to see where they can get the pt in to be seen for pre op clearance. I will update the requesting office as well.

## 2023-06-02 NOTE — Patient Instructions (Addendum)
Your procedure is scheduled on: 06/08/23 - Tuesday Report to the Registration Desk on the 1st floor of the [facility]. To find out your arrival time, please call [phone] between 1PM - 3PM on: 06/07/23 - Monday If your arrival time is 6:00 am, do not arrive before that time as the [facility] entrance doors do not open until 6:00 am.  REMEMBER: Instructions that are not followed completely may result in serious medical risk, up to and including death; or upon the discretion of your surgeon and anesthesiologist your surgery may need to be rescheduled.  Do not eat food after midnight the night before surgery.  No gum chewing or hard candies.  You may however, drink water up to 2 hours before you are scheduled to arrive for your surgery. Do not drink anything within 2 hours of your scheduled arrival time.  One week prior to surgery: Stop Anti-inflammatories (NSAIDS) such as Advil, Aleve, Ibuprofen, Motrin, Naproxen, Naprosyn and Aspirin based products such as Excedrin, Goody's Powder, BC Powder.  Stop beginning 06/02/23, ANY OVER THE COUNTER supplements until after surgery.  You may take Tylenol if needed for pain up until the day of surgery.  Continue taking all prescribed medications with the exception of the following:  aspirin EC hold beginning 06/02/23. FARXIGA hold beginning 06/05/23. HUMALOG KWIKPEN hold the morning of surgery, may resume with meals. TRESIBA FLEXTOUCH inject 1/2 of your prescribe morning dose of insulin on the day of  your surgery.   TAKE ONLY THESE MEDICATIONS THE MORNING OF SURGERY WITH A SIP OF WATER:  lamoTRIgine (LAMICTAL)  PARoxetine (PAXIL)  pregabalin (LYRICA)  propranolol ER  topiramate (TOPAMAX)    No Alcohol for 24 hours before or after surgery.  No Smoking including e-cigarettes for 24 hours before surgery.  No chewable tobacco products for at least 6 hours before surgery.  No nicotine patches on the day of surgery.  Do not use any  "recreational" drugs for at least a week (preferably 2 weeks) before your surgery.  Please be advised that the combination of cocaine and anesthesia may have negative outcomes, up to and including death. If you test positive for cocaine, your surgery will be cancelled.  On the morning of surgery brush your teeth with toothpaste and water, you may rinse your mouth with mouthwash if you wish. Do not swallow any toothpaste or mouthwash.  Use CHG Soap or wipes as directed on instruction sheet.  Do not wear jewelry, make-up, hairpins, clips or nail polish.  Do not wear lotions, powders, or perfumes.   Do not shave body hair from the neck down 48 hours before surgery.  Contact lenses, hearing aids and dentures may not be worn into surgery.  Do not bring valuables to the hospital. [facility] is not responsible for any missing/lost belongings or valuables.   Bring your C-PAP to the hospital in case you may have to spend the night.   Notify your doctor if there is any change in your medical condition (cold, fever, infection).  Wear comfortable clothing (specific to your surgery type) to the hospital.  After surgery, you can help prevent lung complications by doing breathing exercises.  Take deep breaths and cough every 1-2 hours. Your doctor may order a device called an Incentive Spirometer to help you take deep breaths. When coughing or sneezing, hold a pillow firmly against your incision with both hands. This is called "splinting." Doing this helps protect your incision. It also decreases belly discomfort.  If you  are being admitted to the hospital overnight, leave your suitcase in the car. After surgery it may be brought to your room.  In case of increased patient census, it may be necessary for you, the patient, to continue your postoperative care in the Same Day Surgery department.  If you are being discharged the day of surgery, you will not be allowed to drive home. You will need a  responsible individual to drive you home and stay with you for 24 hours after surgery.   If you are taking public transportation, you will need to have a responsible individual with you.  Please call the Pre-admissions Testing Dept. at [phone] if you have any questions about these instructions.  Surgery Visitation Policy:  Patients having surgery or a procedure may have two visitors.  Children under the age of 17 must have an adult with them who is not the patient.  Inpatient Visitation:    Visiting hours are 7 a.m. to 8 p.m. Up to four visitors are allowed at one time in a patient room. The visitors may rotate out with other people during the day.  One visitor age 36 or older may stay with the patient overnight and must be in the room by 8 p.m.    Preparing for Surgery with CHLORHEXIDINE GLUCONATE (CHG) Soap  Chlorhexidine Gluconate (CHG) Soap  o An antiseptic cleaner that kills germs and bonds with the skin to continue killing germs even after washing  o Used for showering the night before surgery and morning of surgery  Before surgery, you can play an important role by reducing the number of germs on your skin.  CHG (Chlorhexidine gluconate) soap is an antiseptic cleanser which kills germs and bonds with the skin to continue killing germs even after washing.  Please do not use if you have an allergy to CHG or antibacterial soaps. If your skin becomes reddened/irritated stop using the CHG.  1. Shower the NIGHT BEFORE SURGERY and the MORNING OF SURGERY with CHG soap.  2. If you choose to wash your hair, wash your hair first as usual with your normal shampoo.  3. After shampooing, rinse your hair and body thoroughly to remove the shampoo.  4. Use CHG as you would any other liquid soap. You can apply CHG directly to the skin and wash gently with a scrungie or a clean washcloth.  5. Apply the CHG soap to your body only from the neck down. Do not use on open wounds or open  sores. Avoid contact with your eyes, ears, mouth, and genitals (private parts). Wash face and genitals (private parts) with your normal soap.  6. Wash thoroughly, paying special attention to the area where your surgery will be performed.  7. Thoroughly rinse your body with warm water.  8. Do not shower/wash with your normal soap after using and rinsing off the CHG soap.  9. Pat yourself dry with a clean towel.  10. Wear clean pajamas to bed the night before surgery.  12. Place clean sheets on your bed the night of your first shower and do not sleep with pets.  13. Shower again with the CHG soap on the day of surgery prior to arriving at the hospital.  14. Do not apply any deodorants/lotions/powders.  15. Please wear clean clothes to the hospital.

## 2023-06-02 NOTE — Progress Notes (Deleted)
Cardiology Office Note    Date:  06/02/2023   ID:  Debra [name], DOB [birth year], MRN [id number]  PCP:  Alease Medina, MD  Cardiologist:  Julien Nordmann, MD  Electrophysiologist:  None   Chief Complaint: Preoperative cardiac risk stratification  History of Present Illness:   Debra [name] is a 62 y.o. female with history of CAD noted on coronary CTA, diabetes, HTN, HLD, Parkinson's disease, ongoing tobacco use, obesity, and multiple medication intolerances/allergies who presents for preoperative cardiac risk stratification.  Carotid artery ultrasound in 12/2020 showed no evidence of atherosclerosis in the bilateral carotid arteries.  Coronary CTA in 01/2021 showed a calcium score of 171 which was the 95th percentile with calcified plaque in the mid LAD.  The mid to distal segments of all 3 major epicardial coronary arteries could not be analyzed due to morbid obesity related artifact.  She subsequently followed up with her primary cardiologist with no further workup indicated with symptoms felt to be atypical in nature.  She was last seen in the office in 03/2022 and was without symptoms of angina or cardiac decompensation.  She is scheduled to undergo a lipoma excision of the left side of the neck on 06/08/2023.  ***  Duke Activity Status Index: *** METs Revised Cardiac Risk Index: *** risk for noncardiac surgery with an estimated rate of ***% for adverse cardiac event in the perioperative timeframe   Labs independently reviewed: 05/2023 - potassium 3.5, BUN 19, serum creatinine 0.88, Hgb 15.0, PLT 249 01/2023 - A1c 7.9 09/2022 - albumin 4.4, AST/ALT normal, TC 137, TG 98, HDL 54, LDL 63, TSH normal  Past Medical History:  Diagnosis Date   Anxiety    Aortic atherosclerosis (HCC)    Arthritis    Bipolar 1 disorder (HCC)    C. difficile colitis    CAD (coronary artery disease)    a.) cCTA 01/10/2021: Ca2+ 171 (95th percentile for age/sex/race match control)   Childhood asthma    DDD  (degenerative disc disease), lumbar    Depression    Family history of adverse reaction to anesthesia    a.) intra/post-operative HYPOtension in 1st degree relative (mother)   Head injury, closed, with brief LOC (HCC) 2011ish   Hepatic steatosis    Hyperlipidemia    Hypertension    Infarction of left basal ganglia (HCC)    a.) CT head 10/21/2016: chronic LEFT basal ganglia lacunar infarct.   Insomnia    a.) on trazodone PRN   Long term current use of aspirin    Myalgia due to statin 03/29/2018   Neuropathy of both feet    Obesity    OSA on CPAP    Parkinson's disease    Pneumonia    PTSD (post-traumatic stress disorder)    RBD (REM behavioral disorder)    Seasonal allergies    Seizures (HCC)    TIA (transient ischemic attack) 2005   Tobacco abuse    Type 2 diabetes mellitus treated with insulin [facility])     Past Surgical History:  Procedure Laterality Date   ABDOMINAL HYSTERECTOMY  2012   BREAST BIOPSY Right    CESAREAN SECTION  1992   CHOLECYSTECTOMY  2013   LUMBAR LAMINECTOMY/DECOMPRESSION MICRODISCECTOMY Left 09/10/2015   Procedure: Left L4-5 Foraminotomy/Left L5-S1 Diskectomy;  Surgeon: Hilda Lias, MD;  Location: [facility];  Service: Neurosurgery;  Laterality: Left;  Left L4-5 Foraminotomy/Left L5-S1 Diskectomy    Current Medications: No outpatient medications have been marked as taking for the  06/04/23 encounter (Appointment) with Sondra Barges, PA-C.    Allergies:   Clindamycin/lincomycin, Montelukast, Primidone, Sulfasalazine, Metformin and related, Benadryl [diphenhydramine hcl], Canagliflozin, Depakote er [divalproex sodium er], Dilaudid [hydromorphone hcl], Diphenhydramine-zinc acetate, Erythromycin, Ethanol, Haloperidol, Neosporin [neomycin-bacitracin zn-polymyx], Sitagliptin, Statins, Sulfa antibiotics, and Victoza [liraglutide]   Social History   Socioeconomic History   Marital status: Married    Spouse name: Fayrene Fearing   Number of children: 1   Years of  education: Not on file   Highest education level: Associate degree: occupational, Scientist, product/process development, or vocational program  Occupational History   Occupation: Disabled  Tobacco Use   Smoking status: Every Day    Packs/day: 0.50    Years: 24.00    Additional pack years: 0.00    Total pack years: 12.00    Types: Cigarettes    Start date: 03/19/2004   Smokeless tobacco: Never  Vaping Use   Vaping Use: Never used  Substance and Sexual Activity   Alcohol use: Not Currently    Comment: occasional- rare   Drug use: No   Sexual activity: Not Currently  Other Topics Concern   Not on file  Social History Narrative   Live in private residence w/ husband and their friend who is wheelchair bound, her end of the house is wheelchair accessible.    Social Determinants of Health   Financial Resource Strain: Low Risk  (06/13/2019)   Overall Financial Resource Strain (CARDIA)    Difficulty of Paying Living Expenses: Not hard at all  Food Insecurity: Food Insecurity Present (06/13/2019)   Hunger Vital Sign    Worried About Running Out of Food in the Last Year: Sometimes true    Ran Out of Food in the Last Year: Never true  Transportation Needs: No Transportation Needs (05/12/2018)   PRAPARE - Administrator, Civil Service (Medical): No    Lack of Transportation (Non-Medical): No  Physical Activity: Inactive (05/12/2018)   Exercise Vital Sign    Days of Exercise per Week: 0 days    Minutes of Exercise per Session: 0 min  Stress: Stress Concern Present (06/13/2019)   [employer] of Occupational Health - Occupational Stress Questionnaire    Feeling of Stress : To some extent  Social Connections: Unknown (06/13/2019)   Social Connection and Isolation Panel [NHANES]    Frequency of Communication with Friends and Family: More than three times a week    Frequency of Social Gatherings with Friends and Family: Not on file    Attends Religious Services: Not on Marketing executive or  Organizations: Not on file    Attends Banker Meetings: Not on file    Marital Status: Not on file     Family History:  The patient's family history includes Bipolar disorder in her brother, brother, and father; COPD in her mother; Diabetes Mellitus II in her brother and father. There is no history of Breast cancer.  ROS:   12-point review of systems is negative unless otherwise noted in the HPI.   EKGs/Labs/Other Studies Reviewed:    Studies reviewed were summarized above. The additional studies were reviewed today:  Coronary CTA 01/09/2021: Aorta:  Normal size.  No calcifications.  No dissection.   Aortic Valve:  Trileaflet.  No calcifications.   Coronary Arteries:  Normal coronary origin.  Right dominance.   RCA is a large dominant artery that gives rise to PDA and PLA. There is no plaque in the proximal segment. Mid to  distal segments could not be analyzed due to obesity related artifacts.   Left main is a large artery that gives rise to LAD and LCX arteries. There is no disease noted in the LM artery   LAD is a large vessel that has no plaque/disease in the proximal vessel. The is calcified plaque in the mid vessel. Mid to distal segments could not be analyzed due to obesity related artifacts.   LCX is a non-dominant artery that gives rise to two obtuse marginal branches. The proximal LCx appears free of disease. Mid to distal segments including the obtuse marginal branches could not be analyzed due to obesity related artifacts.   Other findings:   Normal pulmonary vein drainage into the left atrium.   Normal left atrial appendage without a thrombus.   Normal size of the pulmonary artery.   IMPRESSION: 1. Coronary calcium score of 171. This was 95th percentile for age and sex matched control.   2. Normal coronary origin with right dominance.   3. No evidence of disease noted in the proximal LM or proximal coronary arteries   4. Calcified plaque  noted in the mid LAD   5. Mid to distal segments of all 3 major coronary arteries could not be analyzed due to morbid obesity related artifacts.   6. CAD-RADS N Non-diagnostic study. Obstructive CAD can't be excluded. Alternative evaluation is recommended. __________  Carotid artery ultrasound 01/01/2021: Summary:  Right Carotid: There was no evidence of thrombus, dissection,  atherosclerotic plaque or stenosis in the cervical carotid system.   Left Carotid: There was no evidence of thrombus, dissection,  atherosclerotic plaque or stenosis in the cervical carotid system.     EKG:  EKG is ordered today.  The EKG ordered today demonstrates ***  Recent Labs: 06/02/2023: BUN 19; Creatinine, Ser 0.88; Hemoglobin 15.0; Platelets 249; Potassium 3.5; Sodium 139  Recent Lipid Panel    Component Value Date/Time   CHOL 132 12/13/2020 1716   CHOL 228 (H) 06/14/2015 0818   TRIG 190 (H) 12/13/2020 1716   HDL 52 12/13/2020 1716   HDL 44 06/14/2015 0818   CHOLHDL 2.5 12/13/2020 1716   VLDL 38 12/13/2020 1716   LDLCALC 42 12/13/2020 1716   LDLCALC 24 09/21/2019 0000    PHYSICAL EXAM:    VS:  There were no vitals taken for this visit.  BMI: There is no height or weight on file to calculate BMI.  Physical Exam  Wt Readings from Last 3 Encounters:  05/28/23 (!) 303 lb (137.4 kg)  03/13/22 (!) 302 lb 2 oz (137 kg)  02/02/22 295 lb (133.8 kg)     ASSESSMENT & PLAN:   Preoperative cardiac risk stratification: She is scheduled to undergo excision of a lipoma of the neck on 06/08/2023.  Per due to Duke Status Index, she can achieve *** METs.  Per Revised Cardiac Risk Index, she is *** risk for noncardiac surgery with an estimated rate of ***% for adverse cardiac event in the perioperative timeframe.  CAD involving the native coronary arteries with ***:  HLD with statin intolerance: LDL 63 in 09/2022.  Tobacco use:  Obesity:   {Are you ordering a CV Procedure (e.g. stress test, cath,  DCCV, TEE, etc)?   Press F2        :[id number]}     Disposition: F/u with Dr. Mariah Milling or an APP in ***.   Medication Adjustments/Labs and Tests Ordered: Current medicines are reviewed at length with the patient today.  Concerns regarding medicines  are outlined above. Medication changes, Labs and Tests ordered today are summarized above and listed in the Patient Instructions accessible in Encounters.   Signed, Eula Listen, PA-C 06/02/2023 4:48 PM     [facility] [place] [address] [place], [place] [address] [phone]

## 2023-06-02 NOTE — Telephone Encounter (Signed)
-----   Message from Verlee Monte, NP sent at 06/02/2023 10:15 AM EDT ----- Regarding: Request for pre-operative cardiac clearance Request for pre-operative cardiac clearance:  1. What type of surgery is being performed?  EXCISION LIPOMA OF NECK  2. When is this surgery scheduled?  06/08/2023  3. Type of clearance being requested (medical, pharmacy, both)? MEDICAL   4. Are there any medications that need to be held prior to surgery? ASA  5. Practice name and name of physician performing surgery?  Performing surgeon: Dr. Henrene Dodge Requesting clearance: Debra Mulling, FNP-C    6. Anesthesia type (none, local, MAC, general)? GENERAL  7. What is the office phone and fax number?   Phone: [phone] Fax: [phone]  ATTENTION: Unable to create telephone message as per your standard workflow. Directed by [facility] providers to send requests for cardiac clearance to this pool for appropriate distribution to provider covering pre-operative clearances.   Debra Mulling, MSN, APRN, FNP-C, CEN [facility]  Peri-operative Services Nurse Practitioner Phone: [phone] 06/02/23 10:15 AM

## 2023-06-02 NOTE — Telephone Encounter (Addendum)
Pt has been scheduled to see Eula Listen, [facility] 06/04/23 at 2:45. I will update all parties involved.   Pt is going to need to hold ASA; I do not see that the pre op APP noted the hold time. I will ask pre op APP to amend notes about ASA needing to be held.

## 2023-06-03 ENCOUNTER — Encounter: Payer: Self-pay | Admitting: Surgery

## 2023-06-03 NOTE — Progress Notes (Signed)
Received Medical Clearance from Dr Cheri Kearns. The patietn is cleared for surgery at Low risk. May hold Aspirin 5 days prior to surgery.

## 2023-06-04 ENCOUNTER — Ambulatory Visit: Payer: Medicare HMO | Admitting: Physician Assistant

## 2023-06-04 ENCOUNTER — Telehealth: Payer: Self-pay | Admitting: Surgery

## 2023-06-04 DIAGNOSIS — Z789 Other specified health status: Secondary | ICD-10-CM

## 2023-06-04 DIAGNOSIS — I2510 Atherosclerotic heart disease of native coronary artery without angina pectoris: Secondary | ICD-10-CM

## 2023-06-04 DIAGNOSIS — Z720 Tobacco use: Secondary | ICD-10-CM

## 2023-06-04 DIAGNOSIS — E785 Hyperlipidemia, unspecified: Secondary | ICD-10-CM

## 2023-06-04 DIAGNOSIS — Z01810 Encounter for preprocedural cardiovascular examination: Secondary | ICD-10-CM

## 2023-06-04 DIAGNOSIS — I10 Essential (primary) hypertension: Secondary | ICD-10-CM

## 2023-06-04 LAB — HEMOGLOBIN A1C
Hgb A1c MFr Bld: 8.9 % — ABNORMAL HIGH (ref 4.8–5.6)
Mean Plasma Glucose: 209 mg/dL

## 2023-06-04 NOTE — Telephone Encounter (Signed)
Patient is called and informed that surgery for 06/08/23 will need to be cancelled per Dr. Aleen Campi.  Her A1C is high and recommended for patient to see her endocrinologist until diabetes under better control.  Patient has been called and made aware of this.  She does have an appointment with her endocrinologist and will call them to see if she can get a sooner appointment.

## 2023-06-04 NOTE — Progress Notes (Addendum)
  Perioperative Services: Pre-Admission/Anesthesia Testing  Abnormal Lab Notification    Date: 06/04/23  Name: Debra [name] MRN:   [id number]  Re: Abnormal labs noted during PAT appointment   Provider(s) Notified: Henrene Dodge, MD (general surgery) [name], Eli Phillips, MD (primary care provider) Verdis Frederickson, MD (endocrinology)  Notification mode: Routed and/or faxed via [facility]   ABNORMAL LAB VALUE(S): Lab Results  Component Value Date   GLUCOSE 226 (H) 06/02/2023    Notes: Patient with a T2DM diagnosis. She is currently on both oral (dapagliflozin + pioglitazone) and parenteral (lispro + degludec insulins) therapies. Hgb A1c was 8.9% on 06/02/2023; previously 7.9% on 01/30/2023.  In efforts to reduce the risk of developing SSI, or other potential perioperative complications, this communication is being sent in order to determine if patient is deemed to have adequate medical optimization, including preoperative glycemic control. With that being said, the benefit of improving glycemic control must be weighed against the overall risks associated with delaying a necessary elective surgical procedure for this patient.   Copy of result being sent to patient's care team (surgeon, endocrinologist, and PCP) to make them aware of patient's current glycemic control. Patient is scheduled to have return visit with endocrinology soon. I see documentation in their system where patient has called recently asking about a repeat A1c prior to her upcoming visit with them. Patient will not need that repeat, as result for preoperative testing is being sent over, which in turn will allow for speciality provider to make any medication adjustments while she is in the office.   Quentin Mulling, MSN, APRN, FNP-C, CEN [facility]  Peri-operative Services Nurse Practitioner Phone: [phone] 06/04/23 8:18 AM

## 2023-06-08 ENCOUNTER — Encounter: Admission: RE | Payer: Self-pay | Source: Home / Self Care

## 2023-06-08 ENCOUNTER — Ambulatory Visit: Admission: RE | Admit: 2023-06-08 | Payer: Medicare HMO | Source: Home / Self Care | Admitting: Surgery

## 2023-06-08 HISTORY — DX: Fatty (change of) liver, not elsewhere classified: K76.0

## 2023-06-08 HISTORY — DX: Atherosclerotic heart disease of native coronary artery without angina pectoris: I25.10

## 2023-06-08 HISTORY — DX: Unspecified mononeuropathy of bilateral lower limbs: G57.93

## 2023-06-08 HISTORY — DX: Other intervertebral disc degeneration, lumbar region without mention of lumbar back pain or lower extremity pain: M51.369

## 2023-06-08 HISTORY — DX: Post-traumatic stress disorder, unspecified: F43.10

## 2023-06-08 HISTORY — DX: Bipolar disorder, unspecified: F31.9

## 2023-06-08 HISTORY — DX: Atherosclerosis of aorta: I70.0

## 2023-06-08 HISTORY — DX: Obstructive sleep apnea (adult) (pediatric): G47.33

## 2023-06-08 HISTORY — DX: Type 2 diabetes mellitus without complications: E11.9

## 2023-06-08 HISTORY — DX: REM sleep behavior disorder: G47.52

## 2023-06-08 HISTORY — DX: Long term (current) use of aspirin: Z79.82

## 2023-06-08 HISTORY — DX: Cerebral infarction, unspecified: I63.9

## 2023-06-08 HISTORY — DX: Benign lipomatous neoplasm of skin and subcutaneous tissue of head, face and neck: D17.0

## 2023-06-08 HISTORY — DX: Type 2 diabetes mellitus without complications: Z79.4

## 2023-06-08 HISTORY — DX: Unspecified asthma, uncomplicated: J45.909

## 2023-06-08 SURGERY — EXCISION LIPOMA
Anesthesia: General | Laterality: Left

## 2023-07-06 ENCOUNTER — Other Ambulatory Visit: Payer: Self-pay | Admitting: Psychiatry

## 2023-07-06 DIAGNOSIS — F4310 Post-traumatic stress disorder, unspecified: Secondary | ICD-10-CM

## 2023-07-12 ENCOUNTER — Telehealth: Payer: Medicare HMO | Admitting: Psychiatry

## 2023-07-19 ENCOUNTER — Telehealth (INDEPENDENT_AMBULATORY_CARE_PROVIDER_SITE_OTHER): Payer: Medicare HMO | Admitting: Psychiatry

## 2023-07-19 ENCOUNTER — Encounter: Payer: Self-pay | Admitting: Psychiatry

## 2023-07-19 DIAGNOSIS — F17200 Nicotine dependence, unspecified, uncomplicated: Secondary | ICD-10-CM

## 2023-07-19 DIAGNOSIS — F17210 Nicotine dependence, cigarettes, uncomplicated: Secondary | ICD-10-CM

## 2023-07-19 DIAGNOSIS — F3178 Bipolar disorder, in full remission, most recent episode mixed: Secondary | ICD-10-CM | POA: Diagnosis not present

## 2023-07-19 DIAGNOSIS — F4310 Post-traumatic stress disorder, unspecified: Secondary | ICD-10-CM | POA: Diagnosis not present

## 2023-07-19 NOTE — Progress Notes (Unsigned)
Virtual Visit via Video Note  I connected with Debra [name] on 07/19/23 at  1:30 PM EDT by a video enabled telemedicine application and verified that I am speaking with the correct person using two identifiers.  Location Provider Location : [facility] Patient Location : Home  Participants: Patient , Provider    I discussed the limitations of evaluation and management by telemedicine and the availability of in person appointments. The patient expressed understanding and agreed to proceed.   I discussed the assessment and treatment plan with the patient. The patient was provided an opportunity to ask questions and all were answered. The patient agreed with the plan and demonstrated an understanding of the instructions.   The patient was advised to call back or seek an in-person evaluation if the symptoms worsen or if the condition fails to improve as anticipated.    BH MD OP Progress Note  07/19/2023 1:53 PM Debra [name]  MRN:  [id number]  Chief Complaint:  Chief Complaint  Patient presents with   Follow-up   Anxiety   Depression   Medication Refill   HPI: Debra [name] is a 62 year old Caucasian female, married, disabled, lives in [place], has a history of bipolar disorder, PTSD, tobacco use disorder, Parkinsonian tremors, chronic back pain, hypertension was evaluated by telemedicine today.  Patient today reports she continues to have situational stressors, conflict with her roommate who does not seem to pay the bills that she is supposed to.  That does place financial burden on herself.  Patient reports she does have anxiety due to the same.  She agrees to talk to her therapist, reports she has upcoming appointment through her health insurance plan.  Patient reports sleep is good.  She does have trazodone available however she has not been using it.  Patient denies any suicidality, homicidality or perceptual disturbances.  Patient reports she is compliant on her medications including  the lamotrigine, Paxil.  Denies side effects.  She reports good support system from her spouse.  Patient reports she is trying to cut back on smoking cigarettes.  Receptive to counseling.   Patient denies any other concerns today.  Visit Diagnosis:    ICD-10-CM   1. Bipolar disorder, in full remission, most recent episode mixed (HCC)  F31.78     2. PTSD (post-traumatic stress disorder)  F43.10     3. Tobacco use disorder  F17.200    In partial remission      Past Psychiatric History: I have reviewed past psychiatric history from progress note on 02/16/2019.  Past Medical History:  Past Medical History:  Diagnosis Date   Anxiety    Aortic atherosclerosis (HCC)    Arthritis    Bipolar 1 disorder (HCC)    C. difficile colitis    CAD (coronary artery disease)    a.) cCTA 01/10/2021: Ca2+ 171 (95th percentile for age/sex/race match control)   Childhood asthma    DDD (degenerative disc disease), lumbar    a.) s/p LEFT L4-5 foraminotomy/LEFT L5-S1 discectomy 09/2015   Depression    Family history of adverse reaction to anesthesia    a.) intra/post-operative HYPOtension in 1st degree relative (mother)   Head injury, closed, with brief LOC (HCC) 2011ish   Hepatic steatosis    Hyperlipidemia    a.) on PCSK9i (evolumab) for HLD/ASCVD prevention   Hypertension    Infarction of left basal ganglia (HCC)    a.) CT head 10/21/2016: chronic LEFT basal ganglia lacunar infarct.   Insomnia  a.) on trazodone PRN   Lipoma of neck (LEFT)    Long term current use of aspirin    Myalgia due to statin 03/29/2018   Neuropathy of both feet    Obesity    OSA on CPAP    Parkinson's disease    Pneumonia    PTSD (post-traumatic stress disorder)    RBD (REM behavioral disorder)    Seasonal allergies    Seizures (HCC)    TIA (transient ischemic attack) 2005   Tobacco abuse    Type 2 diabetes mellitus treated with insulin [facility])     Past Surgical History:  Procedure Laterality Date    ABDOMINAL HYSTERECTOMY  2012   BREAST BIOPSY Right    CESAREAN SECTION  1992   CHOLECYSTECTOMY  2013   LUMBAR LAMINECTOMY/DECOMPRESSION MICRODISCECTOMY Left 09/10/2015   Procedure: Left L4-5 Foraminotomy/Left L5-S1 Diskectomy;  Surgeon: Hilda Lias, MD;  Location: [facility];  Service: Neurosurgery;  Laterality: Left;  Left L4-5 Foraminotomy/Left L5-S1 Diskectomy    Family Psychiatric History: I have reviewed family psychiatric history from progress note on 02/16/2019.  Family History:  Family History  Problem Relation Age of Onset   COPD Mother    Bipolar disorder Father    Diabetes Mellitus II Father    Bipolar disorder Brother    Diabetes Mellitus II Brother    Bipolar disorder Brother    Breast cancer Neg Hx     Social History: I have reviewed social history from progress note on 02/16/2019. Social History   Socioeconomic History   Marital status: Married    Spouse name: Fayrene Fearing   Number of children: 1   Years of education: Not on file   Highest education level: Associate degree: occupational, Scientist, product/process development, or vocational program  Occupational History   Occupation: Disabled  Tobacco Use   Smoking status: Every Day    Current packs/day: 0.50    Average packs/day: 0.5 packs/day for 24.0 years (12.0 ttl pk-yrs)    Types: Cigarettes    Start date: 03/19/2004   Smokeless tobacco: Never  Vaping Use   Vaping status: Never Used  Substance and Sexual Activity   Alcohol use: Not Currently    Comment: occasional- rare   Drug use: No   Sexual activity: Not Currently  Other Topics Concern   Not on file  Social History Narrative   Live in private residence w/ husband and their friend who is wheelchair bound, her end of the house is wheelchair accessible.    Social Determinants of Health   Financial Resource Strain: Low Risk  (06/13/2019)   Overall Financial Resource Strain (CARDIA)    Difficulty of Paying Living Expenses: Not hard at all  Food Insecurity: Food Insecurity  Present (06/13/2019)   Hunger Vital Sign    Worried About Running Out of Food in the Last Year: Sometimes true    Ran Out of Food in the Last Year: Never true  Transportation Needs: No Transportation Needs (05/12/2018)   PRAPARE - Administrator, Civil Service (Medical): No    Lack of Transportation (Non-Medical): No  Physical Activity: Inactive (05/12/2018)   Exercise Vital Sign    Days of Exercise per Week: 0 days    Minutes of Exercise per Session: 0 min  Stress: Stress Concern Present (06/13/2019)   [employer] of Occupational Health - Occupational Stress Questionnaire    Feeling of Stress : To some extent  Social Connections: Unknown (06/13/2019)   Social Connection and Isolation Panel [NHANES]  Frequency of Communication with Friends and Family: More than three times a week    Frequency of Social Gatherings with Friends and Family: Not on file    Attends Religious Services: Not on file    Active Member of Clubs or Organizations: Not on file    Attends Banker Meetings: Not on file    Marital Status: Not on file    Allergies:  Allergies  Allergen Reactions   Clindamycin/Lincomycin     Pt states caused C-Diff   Montelukast Hives and Rash    Asthma attacks   Primidone     Diffused erythematous and itchy rash   Sulfasalazine Hives    Other reaction(s): Unknown   Metformin And Related Diarrhea   Benadryl [Diphenhydramine Hcl] Swelling    blistering   Canagliflozin Other (See Comments)    Other reaction(s): Other (See Comments) Hypotension    Depakote Er [Divalproex Sodium Er] Other (See Comments)    seizures   Dilaudid [Hydromorphone Hcl] Other (See Comments)    seizures   Diphenhydramine-Zinc Acetate Other (See Comments)   Erythromycin    Ethanol    Haloperidol Other (See Comments)   Neosporin [Neomycin-Bacitracin Zn-Polymyx] Swelling    blistering   Sitagliptin Other (See Comments)   Statins Other (See Comments)    Other reaction(s):  myalgias   Sulfa Antibiotics Hives    Other reaction(s): Unknown   Victoza [Liraglutide] Nausea And Vomiting and Other (See Comments)    Other reaction(s): Other (See Comments) pancreatitis     Metabolic Disorder Labs: Lab Results  Component Value Date   HGBA1C 8.9 (H) 06/02/2023   MPG 209 06/02/2023   MPG 192 09/21/2019   No results found for: "PROLACTIN" Lab Results  Component Value Date   CHOL 132 12/13/2020   TRIG 190 (H) 12/13/2020   HDL 52 12/13/2020   CHOLHDL 2.5 12/13/2020   VLDL 38 12/13/2020   LDLCALC 42 12/13/2020   LDLCALC 24 09/21/2019   Lab Results  Component Value Date   TSH 1.671 02/16/2019   TSH 2.160 08/20/2015    Therapeutic Level Labs: No results found for: "LITHIUM" No results found for: "VALPROATE" No results found for: "CBMZ"  Current Medications: Current Outpatient Medications  Medication Sig Dispense Refill   acetaminophen (TYLENOL) 500 MG tablet Take 1,000 mg by mouth every 6 (six) hours as needed.     aspirin EC 81 MG tablet Take 81 mg by mouth daily.     blood glucose meter kit and supplies Use up to four times daily as directed, Dx E11.65, LON 99 months 1 each 0   calcium carbonate (OSCAL) 1500 (600 Ca) MG TABS tablet Take 1 tablet by mouth daily with breakfast.      Evolocumab (REPATHA SURECLICK) 140 MG/ML SOAJ Inject 140 mg into the skin every 14 (fourteen) days. 6 mL 3   FARXIGA 10 MG TABS tablet TAKE 1 TABLET DAILY (HIGHER DOSE) 90 tablet 3   furosemide (LASIX) 20 MG tablet Take 20 mg by mouth daily.     glucose blood (ONE TOUCH ULTRA TEST) test strip USE UP TO FOUR TIMES A DAY AS DIRECTED; Dx 11.51, LON 99 months 300 each 3   Insulin Pen Needle 29G X 12.7MM MISC 1 each by Does not apply route as needed. 100 each 3   lamoTRIgine (LAMICTAL) 100 MG tablet TAKE ONE TABLET (100 MG TOTAL) BY MOUTH TWO (TWO) TIMES DAILY. 180 tablet 0   PARoxetine (PAXIL) 40 MG tablet TAKE ONE TABLET  BY MOUTH EVERY MORNING 90 tablet 1   pioglitazone  (ACTOS) 30 MG tablet TAKE 1 TABLET DAILY 90 tablet 0   potassium chloride (KLOR-CON) 10 MEQ tablet Take 10 mEq by mouth daily.     propranolol ER (INDERAL LA) 60 MG 24 hr capsule Take 60 mg by mouth daily.     topiramate (TOPAMAX) 100 MG tablet Take 100 mg by mouth 2 (two) times daily.     TRESIBA FLEXTOUCH 200 UNIT/ML SOPN Inject 120 Units into the skin at bedtime. (Patient taking differently: Inject 140 Units into the skin daily. In the morning) 36 pen 5   clonazePAM (KLONOPIN) 0.5 MG tablet Take 1 tablet (0.5 mg total) by mouth as directed. Take one tablet once a day as needed for severe anxiety 21 tablet 0   HUMALOG KWIKPEN 100 UNIT/ML KwikPen INJECT 20 UNITS UNDER THE SKIN IN THE MORNING AND 35 UNITS WITH LUNCH AND EVENING MEAL (INJECT WITH MEALS) (Patient taking differently: INJECT none UNITS UNDER THE SKIN IN THE MORNING AND 20 UNITS WITH LUNCH AND EVENING MEAL (INJECT WITH MEALS)) 90 mL 3   traZODone (DESYREL) 50 MG tablet Take 1 tablet (50 mg total) by mouth at bedtime as needed for sleep. (Patient not taking: Reported on 07/19/2023) 90 tablet 0   No current facility-administered medications for this visit.     Musculoskeletal: Strength & Muscle Tone:  UTA Gait & Station: normal Patient leans: N/A  Psychiatric Specialty Exam: Review of Systems  Psychiatric/Behavioral:  The patient is nervous/anxious.     There were no vitals taken for this visit.There is no height or weight on file to calculate BMI.  General Appearance: Fairly Groomed  Eye Contact:  Fair  Speech:  Clear and Coherent  Volume:  Normal  Mood:  Anxious  Affect:  Congruent  Thought Process:  Goal Directed and Descriptions of Associations: Intact  Orientation:  Full (Time, Place, and Person)  Thought Content: Logical   Suicidal Thoughts:  No  Homicidal Thoughts:  No  Memory:  Immediate;   Fair Recent;   Fair Remote;   Fair  Judgement:  Fair  Insight:  Fair  Psychomotor Activity:  Normal  Concentration:   Concentration: Fair and Attention Span: Fair  Recall:  [employer] of Knowledge: Fair  Language: Fair  Akathisia:  No  Handed:  Right  AIMS (if indicated): not done  Assets:  Communication Skills Desire for Improvement Housing Social Support  ADL's:  Intact  Cognition: WNL  Sleep:  Fair   Screenings: AIMS    Flowsheet Row Video Visit from 03/20/2022 in [facility]  AIMS Total Score 0      GAD-7    Flowsheet Row Office Visit from 03/10/2023 in [facility] Office Visit from 09/08/2022 in [facility] Video Visit from 05/14/2022 in [facility] Video Visit from 05/09/2021 in [facility]  Total GAD-7 Score 19 10 1 8       Mini-Mental    Flowsheet Row Office Visit from 09/21/2016 in [facility]  Total Score (max 30 points ) 27      PHQ2-9    Flowsheet Row Video Visit from 04/13/2023 in [facility] Office Visit from 03/10/2023 in [facility] Video Visit from 12/09/2022 in [facility] Office Visit from 09/08/2022 in [facility] Video Visit from 05/14/2022 in [facility]  PHQ-2 Total Score 0 6 1 3  0  PHQ-9 Total Score -- 20 -- 8 1      Flowsheet Row Video Visit from 07/19/2023 in [facility] Video Visit from 04/13/2023 in [facility] Office Visit from 03/10/2023 in [facility]  C-SSRS RISK CATEGORY No Risk Low Risk No Risk        Assessment and Plan: Debra [name] is a 62 year old Caucasian female, married, disabled, lives in [place], has a history  of PTSD, bipolar disorder, diabetes, OSA on CPAP, chronic pain was evaluated by telemedicine today.  Patient is currently struggling with situational stressors as noted above, will benefit from psychotherapy, plan as noted below.  Plan Bipolar disorder in remission mixed moderate Lamotrigine 200 mg p.o. daily Paxil 40 mg p.o. daily   PTSD-stable Paxil 40 mg p.o. daily Trazodone 50 mg p.o. nightly available however has not been needing it.  She does have it if needed. Klonopin 0.5 mg as needed for severe anxiety attacks only Reviewed [place] PMP AWARxE  Tobacco use disorder in partial remission Patient currently smokes on and off, patient provided counseling for 1 minute.  Follow-up in clinic in 3 months or sooner if needed.   Collaboration of Care: Collaboration of Care: Referral or follow-up with counselor/therapist AEB patient encouraged to establish care with a therapist.  Patient/Guardian was advised Release of Information must be obtained prior to any record release in order to collaborate their care with an outside provider. Patient/Guardian was advised if they have not already done so to contact the registration department to sign all necessary forms in order for [place] to release information regarding their care.   Consent: Patient/Guardian gives verbal consent for treatment and assignment of benefits for services provided during this visit. Patient/Guardian expressed understanding and agreed to proceed.   This note was generated in part or whole with voice recognition software. Voice recognition is usually quite accurate but there are transcription errors that can and very often do occur. I apologize for any typographical errors that were not detected and corrected.    Jomarie Longs, MD 07/20/2023, 12:48 PM

## 2023-07-30 ENCOUNTER — Telehealth: Payer: Medicare HMO | Admitting: Physician Assistant

## 2023-07-30 DIAGNOSIS — U071 COVID-19: Secondary | ICD-10-CM | POA: Diagnosis not present

## 2023-07-30 MED ORDER — NIRMATRELVIR/RITONAVIR (PAXLOVID)TABLET
3.0000 | ORAL_TABLET | Freq: Two times a day (BID) | ORAL | 0 refills | Status: AC
Start: 2023-07-30 — End: 2023-08-04

## 2023-07-30 MED ORDER — BENZONATATE 100 MG PO CAPS
100.0000 mg | ORAL_CAPSULE | Freq: Three times a day (TID) | ORAL | 0 refills | Status: DC | PRN
Start: 2023-07-30 — End: 2023-09-22

## 2023-07-30 NOTE — Patient Instructions (Signed)
Debra [name], thank you for joining Margaretann Loveless, PA-C for today's virtual visit.  While this provider is not your primary care provider (PCP), if your PCP is located in our provider database this encounter information will be shared with them immediately following your visit.   A [facility] MyChart account gives you access to today's visit and all your visits, tests, and labs performed at [facility] " click here if you don't have a [facility] MyChart account or go to [email or web address]  Consent: (Patient) Debra [name] provided verbal consent for this virtual visit at the beginning of the encounter.  Current Medications:  Current Outpatient Medications:    benzonatate (TESSALON) 100 MG capsule, Take 1 capsule (100 mg total) by mouth 3 (three) times daily as needed., Disp: 30 capsule, Rfl: 0   nirmatrelvir/ritonavir (PAXLOVID) 20 x 150 MG & 10 x 100MG  TABS, Take 3 tablets by mouth 2 (two) times daily for 5 days. (Take nirmatrelvir 150 mg two tablets twice daily for 5 days and ritonavir 100 mg one tablet twice daily for 5 days) Patient GFR is greater than 60, Disp: 30 tablet, Rfl: 0   acetaminophen (TYLENOL) 500 MG tablet, Take 1,000 mg by mouth every 6 (six) hours as needed., Disp: , Rfl:    aspirin EC 81 MG tablet, Take 81 mg by mouth daily., Disp: , Rfl:    blood glucose meter kit and supplies, Use up to four times daily as directed, Dx E11.65, LON 99 months, Disp: 1 each, Rfl: 0   calcium carbonate (OSCAL) 1500 (600 Ca) MG TABS tablet, Take 1 tablet by mouth daily with breakfast. , Disp: , Rfl:    clonazePAM (KLONOPIN) 0.5 MG tablet, Take 1 tablet (0.5 mg total) by mouth as directed. Take one tablet once a day as needed for severe anxiety, Disp: 21 tablet, Rfl: 0   Evolocumab (REPATHA SURECLICK) 140 MG/ML SOAJ, Inject 140 mg into the skin every 14 (fourteen) days., Disp: 6 mL, Rfl: 3   FARXIGA 10 MG TABS tablet, TAKE 1 TABLET DAILY (HIGHER DOSE), Disp: 90 tablet, Rfl:  3   furosemide (LASIX) 20 MG tablet, Take 20 mg by mouth daily., Disp: , Rfl:    glucose blood (ONE TOUCH ULTRA TEST) test strip, USE UP TO FOUR TIMES A DAY AS DIRECTED; Dx 11.51, LON 99 months, Disp: 300 each, Rfl: 3   HUMALOG KWIKPEN 100 UNIT/ML KwikPen, INJECT 20 UNITS UNDER THE SKIN IN THE MORNING AND 35 UNITS WITH LUNCH AND EVENING MEAL (INJECT WITH MEALS) (Patient taking differently: INJECT none UNITS UNDER THE SKIN IN THE MORNING AND 20 UNITS WITH LUNCH AND EVENING MEAL (INJECT WITH MEALS)), Disp: 90 mL, Rfl: 3   Insulin Pen Needle 29G X 12.7MM MISC, 1 each by Does not apply route as needed., Disp: 100 each, Rfl: 3   lamoTRIgine (LAMICTAL) 100 MG tablet, TAKE ONE TABLET (100 MG TOTAL) BY MOUTH TWO (TWO) TIMES DAILY., Disp: 180 tablet, Rfl: 0   PARoxetine (PAXIL) 40 MG tablet, TAKE ONE TABLET BY MOUTH EVERY MORNING, Disp: 90 tablet, Rfl: 1   pioglitazone (ACTOS) 30 MG tablet, TAKE 1 TABLET DAILY, Disp: 90 tablet, Rfl: 0   potassium chloride (KLOR-CON) 10 MEQ tablet, Take 10 mEq by mouth daily., Disp: , Rfl:    propranolol ER (INDERAL LA) 60 MG 24 hr capsule, Take 60 mg by mouth daily., Disp: , Rfl:    topiramate (TOPAMAX) 100 MG tablet, Take 100 mg by mouth 2 (two) times daily.,  Disp: , Rfl:    traZODone (DESYREL) 50 MG tablet, Take 1 tablet (50 mg total) by mouth at bedtime as needed for sleep. (Patient not taking: Reported on 07/19/2023), Disp: 90 tablet, Rfl: 0   TRESIBA FLEXTOUCH 200 UNIT/ML SOPN, Inject 120 Units into the skin at bedtime. (Patient taking differently: Inject 140 Units into the skin daily. In the morning), Disp: 36 pen, Rfl: 5   Medications ordered in this encounter:  Meds ordered this encounter  Medications   nirmatrelvir/ritonavir (PAXLOVID) 20 x 150 MG & 10 x 100MG  TABS    Sig: Take 3 tablets by mouth 2 (two) times daily for 5 days. (Take nirmatrelvir 150 mg two tablets twice daily for 5 days and ritonavir 100 mg one tablet twice daily for 5 days) Patient GFR is greater  than 60    Dispense:  30 tablet    Refill:  0    Order Specific Question:   Supervising Provider    Answer:   Merrilee Jansky [[id number]]   benzonatate (TESSALON) 100 MG capsule    Sig: Take 1 capsule (100 mg total) by mouth 3 (three) times daily as needed.    Dispense:  30 capsule    Refill:  0    Order Specific Question:   Supervising Provider    Answer:   Merrilee Jansky [id number]     *If you need refills on other medications prior to your next appointment, please contact your pharmacy*  Follow-Up: Call back or seek an in-person evaluation if the symptoms worsen or if the condition fails to improve as anticipated.  [facility] [phone]  Care Instructions: Nirmatrelvir; Ritonavir Tablets What is this medication? NIRMATRELVIR; RITONAVIR (NIR ma TREL vir; ri TOE na veer) treats mild to moderate COVID-19. It may help people who are at high risk of developing severe illness. It works by limiting the spread of the virus in your body. This medicine may be used for other purposes; ask your health care provider or pharmacist if you have questions. COMMON BRAND NAME(S): PAXLOVID What should I tell my care team before I take this medication? They need to know if you have any of these conditions: Any allergies Any serious illness Kidney disease Liver disease An unusual or allergic reaction to nirmatrelvir, ritonavir, other medications, foods, dyes, or preservatives Pregnant or trying to get pregnant Breast-feeding How should I use this medication? This product contains 2 different medications that are packaged together. For the standard dose, take 2 pink tablets of nirmatrelvir with 1 white tablet of ritonavir (3 tablets total) by mouth with water twice daily. Talk to your care team if you have kidney disease. You may need a different dose. Swallow the tablets whole. You can take it with or without food. If it upsets your stomach, take it with food. Take all of this  medication unless your care team tells you to stop it early. Keep taking it even if you think you are better. Talk to your care team about the use of this medication in children. While it may be prescribed for children as young as 12 years for selected conditions, precautions do apply. Overdosage: If you think you have taken too much of this medicine contact a poison control center or emergency room at once. NOTE: This medicine is only for you. Do not share this medicine with others. What if I miss a dose? If you miss a dose, take it as soon as you can unless it is more  than 8 hours late. If it is more than 8 hours late, skip the missed dose. Take the next dose at the normal time. Do not take extra or 2 doses at the same time to make up for the missed dose. What may interact with this medication? Do not take this medication with any of the following: Alfuzosin Certain medications for anxiety or sleep, such as midazolam or triazolam Certain medications for cancer, such as apalutamide Certain medications for cholesterol, such as lovastatin or simvastatin Certain medications for irregular heartbeat, such as amiodarone, dronedarone, flecainide, propafenone, quinidine Certain medications for mental health conditions, such as lurasidone or pimozide Certain medications for seizures, such as carbamazepine, phenobarbital, phenytoin, primidone Colchicine Eletriptan Eplerenone Ergot alkaloids, such as dihydroergotamine, ergotamine, methylergonovine Finerenone Flibanserin Ivabradine Lomitapide Lumacaftor; ivacaftor Naloxegol Ranolazine Red Yeast Rice Rifampin Rifapentine Sildenafil Silodosin St. John's wort Tolvaptan Ubrogepant Voclosporin This medication may affect how other medications work, and other medications may affect the way this medication works. Talk with your care team about all of the medications you take. They may suggest changes to your treatment plan to lower the risk of side  effects and to make sure your medications work as intended. This list may not describe all possible interactions. Give your health care provider a list of all the medicines, herbs, non-prescription drugs, or dietary supplements you use. Also tell them if you smoke, drink alcohol, or use illegal drugs. Some items may interact with your medicine. What should I watch for while using this medication? Your condition will be monitored carefully while you are receiving this medication. Visit your care team for regular checkups. Tell your care team if your symptoms do not start to get better or if they get worse. If you have untreated HIV infection, this medication may lead to some HIV medications not working as well in the future. Estrogen and progestin hormones may not work as well while you are taking this medication. Your care team can help you find the contraceptive option that works for you. What side effects may I notice from receiving this medication? Side effects that you should report to your care team as soon as possible: Allergic reactions--skin rash, itching, hives, swelling of the face, lips, tongue, or throat Liver injury--right upper belly pain, loss of appetite, nausea, light-colored stool, dark yellow or brown urine, yellowing skin or eyes, unusual weakness or fatigue Redness, blistering, peeling, or loosening of the skin, including inside the mouth Side effects that usually do not require medical attention (report these to your care team if they continue or are bothersome): Change in taste Diarrhea General discomfort and fatigue Increase in blood pressure Muscle pain Nausea Stomach pain This list may not describe all possible side effects. Call your doctor for medical advice about side effects. You may report side effects to FDA at 1-800-FDA-1088. Where should I keep my medication? Keep out of the reach of children and pets. Store at room temperature between 20 and 25 degrees C (68  and 77 degrees F). Get rid of any unused medication after the expiration date. To get rid of medications that are no longer needed or have expired: Take the medication to a medication take-back program. Check with your pharmacy or law enforcement to find a location. If you cannot return the medication, check the label or package insert to see if the medication should be thrown out in the garbage or flushed down the toilet. If you are not sure, ask your care team. If it is safe  to put it in the trash, take the medication out of the container. Mix the medication with cat litter, dirt, coffee grounds, or other unwanted substance. Seal the mixture in a bag or container. Put it in the trash. NOTE: This sheet is a summary. It may not cover all possible information. If you have questions about this medicine, talk to your doctor, pharmacist, or health care provider.  2024 Elsevier/Gold Standard (2023-01-11 00:00:00)    Isolation Instructions: You are to isolate at home until you have been fever free for at least 24 hours without a fever-reducing medication, and symptoms have been steadily improving for 24 hours. At that time,  you can end isolation but need to mask for an additional 5 days.   If you must be around other household members who do not have symptoms, you need to make sure that both you and the family members are masking consistently with a high-quality mask.  If you note any worsening of symptoms despite treatment, please seek an in-person evaluation ASAP. If you note any significant shortness of breath or any chest pain, please seek ER evaluation. Please do not delay care!   COVID-19: What to Do if You Are Sick If you test positive and are an older adult or someone who is at high risk of getting very sick from COVID-19, treatment may be available. Contact a healthcare provider right away after a positive test to determine if you are eligible, even if your symptoms are mild right now. You can  also visit a Test to Treat location and, if eligible, receive a prescription from a provider. Don't delay: Treatment must be started within the first few days to be effective. If you have a fever, cough, or other symptoms, you might have COVID-19. Most people have mild illness and are able to recover at home. If you are sick: Keep track of your symptoms. If you have an emergency warning sign (including trouble breathing), call 911. Steps to help prevent the spread of COVID-19 if you are sick If you are sick with COVID-19 or think you might have COVID-19, follow the steps below to care for yourself and to help protect other people in your home and community. Stay home except to get medical care Stay home. Most people with COVID-19 have mild illness and can recover at home without medical care. Do not leave your home, except to get medical care. Do not visit public areas and do not go to places where you are unable to wear a mask. Take care of yourself. Get rest and stay hydrated. Take over-the-counter medicines, such as acetaminophen, to help you feel better. Stay in touch with your doctor. Call before you get medical care. Be sure to get care if you have trouble breathing, or have any other emergency warning signs, or if you think it is an emergency. Avoid public transportation, ride-sharing, or taxis if possible. Get tested If you have symptoms of COVID-19, get tested. While waiting for test results, stay away from others, including staying apart from those living in your household. Get tested as soon as possible after your symptoms start. Treatments may be available for people with COVID-19 who are at risk for becoming very sick. Don't delay: Treatment must be started early to be effective--some treatments must begin within 5 days of your first symptoms. Contact your healthcare provider right away if your test result is positive to determine if you are eligible. Self-tests are one of several options  for testing for the  virus that causes COVID-19 and may be more convenient than laboratory-based tests and point-of-care tests. Ask your healthcare provider or your local health department if you need help interpreting your test results. You can visit your state, tribal, local, and territorial health department's website to look for the latest local information on testing sites. Separate yourself from other people As much as possible, stay in a specific room and away from other people and pets in your home. If possible, you should use a separate bathroom. If you need to be around other people or animals in or outside of the home, wear a well-fitting mask. Tell your close contacts that they may have been exposed to COVID-19. An infected person can spread COVID-19 starting 48 hours (or 2 days) before the person has any symptoms or tests positive. By letting your close contacts know they may have been exposed to COVID-19, you are helping to protect everyone. See COVID-19 and Animals if you have questions about pets. If you are diagnosed with COVID-19, someone from the health department may call you. Answer the call to slow the spread. Monitor your symptoms Symptoms of COVID-19 include fever, cough, or other symptoms. Follow care instructions from your healthcare provider and local health department. Your local health authorities may give instructions on checking your symptoms and reporting information. When to seek emergency medical attention Look for emergency warning signs* for COVID-19. If someone is showing any of these signs, seek emergency medical care immediately: Trouble breathing Persistent pain or pressure in the chest New confusion Inability to wake or stay awake Pale, gray, or blue-colored skin, lips, or nail beds, depending on skin tone *This list is not all possible symptoms. Please call your medical provider for any other symptoms that are severe or concerning to you. Call 911 or call  ahead to your local emergency facility: Notify the operator that you are seeking care for someone who has or may have COVID-19. Call ahead before visiting your doctor Call ahead. Many medical visits for routine care are being postponed or done by phone or telemedicine. If you have a medical appointment that cannot be postponed, call your doctor's office, and tell them you have or may have COVID-19. This will help the office protect themselves and other patients. If you are sick, wear a well-fitting mask You should wear a mask if you must be around other people or animals, including pets (even at home). Wear a mask with the best fit, protection, and comfort for you. You don't need to wear the mask if you are alone. If you can't put on a mask (because of trouble breathing, for example), cover your coughs and sneezes in some other way. Try to stay at least 6 feet away from other people. This will help protect the people around you. Masks should not be placed on young children under age 89 years, anyone who has trouble breathing, or anyone who is not able to remove the mask without help. Cover your coughs and sneezes Cover your mouth and nose with a tissue when you cough or sneeze. Throw away used tissues in a lined trash can. Immediately wash your hands with soap and water for at least 20 seconds. If soap and water are not available, clean your hands with an alcohol-based hand sanitizer that contains at least 60% alcohol. Clean your hands often Wash your hands often with soap and water for at least 20 seconds. This is especially important after blowing your nose, coughing, or sneezing; going to the  bathroom; and before eating or preparing food. Use hand sanitizer if soap and water are not available. Use an alcohol-based hand sanitizer with at least 60% alcohol, covering all surfaces of your hands and rubbing them together until they feel dry. Soap and water are the best option, especially if hands are  visibly dirty. Avoid touching your eyes, nose, and mouth with unwashed hands. Handwashing Tips Avoid sharing personal household items Do not share dishes, drinking glasses, cups, eating utensils, towels, or bedding with other people in your home. Wash these items thoroughly after using them with soap and water or put in the dishwasher. Clean surfaces in your home regularly Clean and disinfect high-touch surfaces (for example, doorknobs, tables, handles, light switches, and countertops) in your "sick room" and bathroom. In shared spaces, you should clean and disinfect surfaces and items after each use by the person who is ill. If you are sick and cannot clean, a caregiver or other person should only clean and disinfect the area around you (such as your bedroom and bathroom) on an as needed basis. Your caregiver/other person should wait as long as possible (at least several hours) and wear a mask before entering, cleaning, and disinfecting shared spaces that you use. Clean and disinfect areas that may have blood, stool, or body fluids on them. Use household cleaners and disinfectants. Clean visible dirty surfaces with household cleaners containing soap or detergent. Then, use a household disinfectant. Use a product from [employer] List N: Disinfectants for Coronavirus (COVID-19). Be sure to follow the instructions on the label to ensure safe and effective use of the product. Many products recommend keeping the surface wet with a disinfectant for a certain period of time (look at "contact time" on the product label). You may also need to wear personal protective equipment, such as gloves, depending on the directions on the product label. Immediately after disinfecting, wash your hands with soap and water for 20 seconds. For completed guidance on cleaning and disinfecting your home, visit Complete Disinfection Guidance. Take steps to improve ventilation at home Improve ventilation (air flow) at home to help  prevent from spreading COVID-19 to other people in your household. Clear out COVID-19 virus particles in the air by opening windows, using air filters, and turning on fans in your home. Use this interactive tool to learn how to improve air flow in your home. When you can be around others after being sick with COVID-19 Deciding when you can be around others is different for different situations. Find out when you can safely end home isolation. For any additional questions about your care, contact your healthcare provider or state or local health department. 02/25/2021 Content source: [facility] ([facility]), Division of Viral Diseases This information is not intended to replace advice given to you by your health care provider. Make sure you discuss any questions you have with your health care provider. Document Revised: 04/10/2021 Document Reviewed: 04/10/2021 Elsevier Patient Education  2022 [employer].     If you have been instructed to have an in-person evaluation today at a local Urgent Care facility, please use the link below. It will take you to a list of all of our available [facility], including address, phone number and hours of operation. Please do not delay care.   Urgent Cares  If you or a family member do not have a primary care provider, use the link below to schedule a visit and establish care. When you choose  a [facility] primary care physician or advanced practice provider, you gain a long-term partner in health. Find a Primary Care Provider  Learn more about [facility]'s in-office and virtual care options: [place] [facility] - Get Care Now

## 2023-07-30 NOTE — Progress Notes (Signed)
Virtual Visit Consent   Debra [name], you are scheduled for a virtual visit with a [facility] provider today. Just as with appointments in the office, your consent must be obtained to participate. Your consent will be active for this visit and any virtual visit you may have with one of our providers in the next 365 days. If you have a MyChart account, a copy of this consent can be sent to you electronically.  As this is a virtual visit, video technology does not allow for your provider to perform a traditional examination. This may limit your provider's ability to fully assess your condition. If your provider identifies any concerns that need to be evaluated in person or the need to arrange testing (such as labs, EKG, etc.), we will make arrangements to do so. Although advances in technology are sophisticated, we cannot ensure that it will always work on either your end or our end. If the connection with a video visit is poor, the visit may have to be switched to a telephone visit. With either a video or telephone visit, we are not always able to ensure that we have a secure connection.  By engaging in this virtual visit, you consent to the provision of healthcare and authorize for your insurance to be billed (if applicable) for the services provided during this visit. Depending on your insurance coverage, you may receive a charge related to this service.  I need to obtain your verbal consent now. Are you willing to proceed with your visit today? Debra [name] has provided verbal consent on 07/30/2023 for a virtual visit (video or telephone). Margaretann Loveless, PA-C  Date: 07/30/2023 6:49 PM  Virtual Visit via Video Note   I, Margaretann Loveless, connected with  Debra [name]  ([id number], 1961-08-04) on 07/30/23 at  6:30 PM EDT by a video-enabled telemedicine application and verified that I am speaking with the correct person using two identifiers.  Location: Patient: Virtual Visit Location Patient:  Home Provider: Virtual Visit Location Provider: Home Office   I discussed the limitations of evaluation and management by telemedicine and the availability of in person appointments. The patient expressed understanding and agreed to proceed.    History of Present Illness: Debra [name] is a 62 y.o. who identifies as a female who was assigned female at birth, and is being seen today for Covid 24.  HPI: URI  This is a new problem. The current episode started yesterday (Tested positive for Covid 19 on at home test; Symptoms started). The problem has been gradually worsening. There has been no fever. Associated symptoms include congestion, coughing (dry), ear pain, headaches, joint pain (myalgias), a plugged ear sensation, rhinorrhea, sinus pain, sneezing and a sore throat. Pertinent negatives include no chest pain, diarrhea, nausea or vomiting. Associated symptoms comments: Chills. She has tried NSAIDs (nyquil) for the symptoms. The treatment provided no relief.     Problems:  Patient Active Problem List   Diagnosis Date Noted   Bereavement 02/17/2022   Allergic reaction 10/10/2020   Essential tremor 10/10/2020   Depression    Anxiety    Seizures (HCC)    Bipolar disorder, in full remission, most recent episode mixed (HCC) 08/01/2020   Pain due to onychomycosis of toenails of both feet 09/11/2019   Pincer nail deformity 09/11/2019   Bipolar 1 disorder, mixed, moderate (HCC) 08/30/2019   PTSD (post-traumatic stress disorder) 08/30/2019   Tobacco use disorder 08/30/2019   Obstructive sleep apnea 11/18/2018  Parkinsonian tremor 11/18/2018   RBD (REM behavioral disorder) 05/19/2018   Morbid obesity (HCC) 05/03/2018   Osteopenia 03/16/2018   Abdominal aortic atherosclerosis (HCC) 03/04/2018   Adverse reaction to statin medication 03/04/2018   Uncontrolled diabetes mellitus type 2 with atherosclerosis of arteries of extremities 03/04/2018   Hyperlipidemia 04/20/2017   Lumbar disc disease  with radiculopathy 08/20/2015   Lumbar herniated disc 08/20/2015   Left-sided low back pain with sciatica 06/03/2015   Bipolar disorder (HCC) 06/03/2015   Insomnia, persistent 06/03/2015   Elevated blood pressure reading in office with diagnosis of hypertension 06/03/2015   Compulsive tobacco user syndrome 06/03/2015   Phlebectasia 06/03/2015    Allergies:  Allergies  Allergen Reactions   Clindamycin/Lincomycin     Pt states caused C-Diff   Montelukast Hives and Rash    Asthma attacks   Primidone     Diffused erythematous and itchy rash   Sulfasalazine Hives    Other reaction(s): Unknown   Metformin And Related Diarrhea   Benadryl [Diphenhydramine Hcl] Swelling    blistering   Canagliflozin Other (See Comments)    Other reaction(s): Other (See Comments) Hypotension    Depakote Er [Divalproex Sodium Er] Other (See Comments)    seizures   Dilaudid [Hydromorphone Hcl] Other (See Comments)    seizures   Diphenhydramine-Zinc Acetate Other (See Comments)   Erythromycin    Ethanol    Haloperidol Other (See Comments)   Neosporin [Neomycin-Bacitracin Zn-Polymyx] Swelling    blistering   Sitagliptin Other (See Comments)   Statins Other (See Comments)    Other reaction(s): myalgias   Sulfa Antibiotics Hives    Other reaction(s): Unknown   Victoza [Liraglutide] Nausea And Vomiting and Other (See Comments)    Other reaction(s): Other (See Comments) pancreatitis    Medications:  Current Outpatient Medications:    nirmatrelvir/ritonavir (PAXLOVID) 20 x 150 MG & 10 x 100MG  TABS, Take 3 tablets by mouth 2 (two) times daily for 5 days. (Take nirmatrelvir 150 mg two tablets twice daily for 5 days and ritonavir 100 mg one tablet twice daily for 5 days) Patient GFR is greater than 60, Disp: 30 tablet, Rfl: 0   acetaminophen (TYLENOL) 500 MG tablet, Take 1,000 mg by mouth every 6 (six) hours as needed., Disp: , Rfl:    aspirin EC 81 MG tablet, Take 81 mg by mouth daily., Disp: , Rfl:     blood glucose meter kit and supplies, Use up to four times daily as directed, Dx E11.65, LON 99 months, Disp: 1 each, Rfl: 0   calcium carbonate (OSCAL) 1500 (600 Ca) MG TABS tablet, Take 1 tablet by mouth daily with breakfast. , Disp: , Rfl:    clonazePAM (KLONOPIN) 0.5 MG tablet, Take 1 tablet (0.5 mg total) by mouth as directed. Take one tablet once a day as needed for severe anxiety, Disp: 21 tablet, Rfl: 0   Evolocumab (REPATHA SURECLICK) 140 MG/ML SOAJ, Inject 140 mg into the skin every 14 (fourteen) days., Disp: 6 mL, Rfl: 3   FARXIGA 10 MG TABS tablet, TAKE 1 TABLET DAILY (HIGHER DOSE), Disp: 90 tablet, Rfl: 3   furosemide (LASIX) 20 MG tablet, Take 20 mg by mouth daily., Disp: , Rfl:    glucose blood (ONE TOUCH ULTRA TEST) test strip, USE UP TO FOUR TIMES A DAY AS DIRECTED; Dx 11.51, LON 99 months, Disp: 300 each, Rfl: 3   HUMALOG KWIKPEN 100 UNIT/ML KwikPen, INJECT 20 UNITS UNDER THE SKIN IN THE MORNING AND 35 UNITS WITH  LUNCH AND EVENING MEAL (INJECT WITH MEALS) (Patient taking differently: INJECT none UNITS UNDER THE SKIN IN THE MORNING AND 20 UNITS WITH LUNCH AND EVENING MEAL (INJECT WITH MEALS)), Disp: 90 mL, Rfl: 3   Insulin Pen Needle 29G X 12.7MM MISC, 1 each by Does not apply route as needed., Disp: 100 each, Rfl: 3   lamoTRIgine (LAMICTAL) 100 MG tablet, TAKE ONE TABLET (100 MG TOTAL) BY MOUTH TWO (TWO) TIMES DAILY., Disp: 180 tablet, Rfl: 0   PARoxetine (PAXIL) 40 MG tablet, TAKE ONE TABLET BY MOUTH EVERY MORNING, Disp: 90 tablet, Rfl: 1   pioglitazone (ACTOS) 30 MG tablet, TAKE 1 TABLET DAILY, Disp: 90 tablet, Rfl: 0   potassium chloride (KLOR-CON) 10 MEQ tablet, Take 10 mEq by mouth daily., Disp: , Rfl:    propranolol ER (INDERAL LA) 60 MG 24 hr capsule, Take 60 mg by mouth daily., Disp: , Rfl:    topiramate (TOPAMAX) 100 MG tablet, Take 100 mg by mouth 2 (two) times daily., Disp: , Rfl:    traZODone (DESYREL) 50 MG tablet, Take 1 tablet (50 mg total) by mouth at bedtime as  needed for sleep. (Patient not taking: Reported on 07/19/2023), Disp: 90 tablet, Rfl: 0   TRESIBA FLEXTOUCH 200 UNIT/ML SOPN, Inject 120 Units into the skin at bedtime. (Patient taking differently: Inject 140 Units into the skin daily. In the morning), Disp: 36 pen, Rfl: 5  Observations/Objective: Patient is well-developed, well-nourished in no acute distress.  Resting comfortably at home.  Head is normocephalic, atraumatic.  No labored breathing.  Speech is clear and coherent with logical content.  Patient is alert and oriented at baseline.    Assessment and Plan: 1. COVID-19 - nirmatrelvir/ritonavir (PAXLOVID) 20 x 150 MG & 10 x 100MG  TABS; Take 3 tablets by mouth 2 (two) times daily for 5 days. (Take nirmatrelvir 150 mg two tablets twice daily for 5 days and ritonavir 100 mg one tablet twice daily for 5 days) Patient GFR is greater than 60  Dispense: 30 tablet; Refill: 0 - MyChart COVID-19 home monitoring program; Future  - Continue OTC symptomatic management of choice - Will send OTC vitamins and supplement information through AVS - Paxlovid prescribed - Patient enrolled in MyChart symptom monitoring - Push fluids - Rest as needed - Discussed return precautions and when to seek in-person evaluation, sent via AVS as well   Follow Up Instructions: I discussed the assessment and treatment plan with the patient. The patient was provided an opportunity to ask questions and all were answered. The patient agreed with the plan and demonstrated an understanding of the instructions.  A copy of instructions were sent to the patient via MyChart unless otherwise noted below.    The patient was advised to call back or seek an in-person evaluation if the symptoms worsen or if the condition fails to improve as anticipated.  Time:  I spent 10 minutes with the patient via telehealth technology discussing the above problems/concerns.    Margaretann Loveless, PA-C

## 2023-09-22 ENCOUNTER — Ambulatory Visit: Payer: Medicare HMO | Admitting: Surgery

## 2023-09-22 ENCOUNTER — Encounter: Payer: Self-pay | Admitting: Surgery

## 2023-09-22 VITALS — BP 150/71 | HR 77 | Temp 98.0°F | Ht 65.5 in | Wt 305.0 lb

## 2023-09-22 DIAGNOSIS — D170 Benign lipomatous neoplasm of skin and subcutaneous tissue of head, face and neck: Secondary | ICD-10-CM | POA: Diagnosis not present

## 2023-09-22 NOTE — Patient Instructions (Signed)
You have requested to have your lipoma removed. This will be done at [facility] with Dr. Aleen Campi.  You will most likely be out of work 1-2 weeks for this surgery.  If you have FMLA or disability paperwork that needs filled out you may drop this off at our office or this can be faxed to [phone].  Please see the (blue)pre-care form that you have been given today. Our surgery scheduler will call you to verify surgery date and to go over information.   If you have any questions, please call our office.  Lipoma Removal  Lipoma removal is a surgical procedure to remove a lipoma, which is a noncancerous (benign) tumor that is made up of fat cells. Most lipomas are small and painless and do not require treatment. They can form in many areas of the body but are most common under the skin of the back, arms, shoulders, buttocks, and thighs. You may need lipoma removal if you have a lipoma that is large, growing, or causing discomfort. Lipoma removal may also be done for cosmetic reasons. Tell a health care provider about: Any allergies you have. All medicines you are taking, including vitamins, herbs, eye drops, creams, and over-the-counter medicines. Any problems you or family members have had with anesthetic medicines. Any bleeding problems you have. Any surgeries you have had. Any medical conditions you have. Whether you are pregnant or may be pregnant. What are the risks? Generally, this is a safe procedure. However, problems may occur, including: Infection. Bleeding. Scarring. Allergic reactions to medicines. Damage to nearby structures or organs, such as damage to nerves or blood vessels near the lipoma. What happens before the procedure? Medicines Ask your health care provider about: Changing or stopping your regular medicines. This is especially important if you are taking diabetes medicines or blood thinners. Taking medicines such as aspirin and ibuprofen. These medicines  can thin your blood. Do not take these medicines unless your health care provider tells you to take them. Taking over-the-counter medicines, vitamins, herbs, and supplements. General instructions You will have a physical exam. Your health care provider will check the size of the lipoma and whether it can be removed easily. You may have a biopsy and imaging tests, such as X-rays, a CT scan, and an MRI. Do not use any products that contain nicotine or tobacco for at least 4 weeks before the procedure. These products include cigarettes, chewing tobacco, and vaping devices, such as e-cigarettes. If you need help quitting, ask your health care provider. Ask your health care provider: How your surgery site will be marked. What steps will be taken to help prevent infection. These may include: Washing skin with a germ-killing soap. Taking antibiotic medicine. If you will be going home right after the procedure, plan to have a responsible adult: Take you home from the hospital or clinic. You will not be allowed to drive. Care for you for the time you are told. What happens during the procedure?  An IV will be inserted into one of your veins. You will be given one or more of the following: A medicine to help you relax (sedative). A medicine to numb the area (local anesthetic). A medicine to make you fall asleep (general anesthetic). A medicine that is injected into an area of your body to numb everything below the injection site (regional anesthetic). An incision will be made into the skin over the lipoma or very near the lipoma. The incision may be made in a natural  skin line or crease. Tissues, nerves, and blood vessels near the lipoma will be moved out of the way. The lipoma and the capsule that surrounds it will be separated from the surrounding tissues. The lipoma will be removed. The incision may be closed with stitches (sutures). A bandage (dressing) will be placed over the incision. The  procedure may vary among health care providers and hospitals. What happens after the procedure? Your blood pressure, heart rate, breathing rate, and blood oxygen level will be monitored until you leave the hospital or clinic. If you were prescribed an antibiotic medicine, use it as told by your health care provider. Do not stop using the antibiotic even if you start to feel better. If you were given a sedative during the procedure, it can affect you for several hours. Do not drive or operate machinery until your health care provider says that it is safe. Where to find more information OrthoInfo: orthoinfo.aaos.org Summary Before the procedure, follow instructions from your health care provider about eating and drinking, and changing or stopping your regular medicines. This is especially important if you are taking diabetes medicines or blood thinners. After the lipoma is removed, the incision may be closed with stitches (sutures) and covered with a bandage (dressing). If you were given a sedative during the procedure, it can affect you for several hours. Do not drive or operate machinery until your health care provider says that it is safe. This information is not intended to replace advice given to you by your health care provider. Make sure you discuss any questions you have with your health care provider. Document Revised: 12/12/2021 Document Reviewed: 12/12/2021 Elsevier Patient Education  2024 [employer].

## 2023-09-22 NOTE — Progress Notes (Signed)
09/22/2023  History of Present Illness: Debra [name] is a 62 y.o. female presenting for follow-up of a left posterior neck lipoma.  The patient was last seen on 05/28/2023 and had been scheduled for surgery on 06/08/2023.  However on preoperative workup by anesthesia team, she was found to have an elevated hemoglobin A1c of 8.9.  As such, and given that this was not an emergent or urgent case, it was decided to cancel her surgery until patient had better diabetes control.  The patient has been following up with her endocrinologist and her most recent hemoglobin A1c which was obtained on 09/06/2023 has gone down to 7.5.  The patient presents today for further discussion to see if it is reasonable to schedule her back for surgery.  The patient reports that overall her symptoms have been stable and she reports that the strap of her CPAP ask continues to push impinge on the posterior, superior left neck.  This still causes discomfort in that area but reports that her symptoms have not worsened.  She is very happy with her improvement in her diabetes control and has made appropriate changes in her diet for this.  Past Medical History: Past Medical History:  Diagnosis Date   Anxiety    Aortic atherosclerosis (HCC)    Arthritis    Bipolar 1 disorder (HCC)    C. difficile colitis    CAD (coronary artery disease)    a.) cCTA 01/10/2021: Ca2+ 171 (95th percentile for age/sex/race match control)   Childhood asthma    DDD (degenerative disc disease), lumbar    a.) s/p LEFT L4-5 foraminotomy/LEFT L5-S1 discectomy 09/2015   Depression    Family history of adverse reaction to anesthesia    a.) intra/post-operative HYPOtension in 1st degree relative (mother)   Head injury, closed, with brief LOC (HCC) 2011ish   Hepatic steatosis    Hyperlipidemia    a.) on PCSK9i (evolumab) for HLD/ASCVD prevention   Hypertension    Infarction of left basal ganglia (HCC)    a.) CT head 10/21/2016: chronic LEFT basal ganglia  lacunar infarct.   Insomnia    a.) on trazodone PRN   Lipoma of neck (LEFT)    Long term current use of aspirin    Myalgia due to statin 03/29/2018   Neuropathy of both feet    Obesity    OSA on CPAP    Parkinson's disease (HCC)    Pneumonia    PTSD (post-traumatic stress disorder)    RBD (REM behavioral disorder)    Seasonal allergies    Seizures (HCC)    TIA (transient ischemic attack) 2005   Tobacco abuse    Type 2 diabetes mellitus treated with insulin [facility])      Past Surgical History: Past Surgical History:  Procedure Laterality Date   ABDOMINAL HYSTERECTOMY  2012   BREAST BIOPSY Right    CESAREAN SECTION  1992   CHOLECYSTECTOMY  2013   LUMBAR LAMINECTOMY/DECOMPRESSION MICRODISCECTOMY Left 09/10/2015   Procedure: Left L4-5 Foraminotomy/Left L5-S1 Diskectomy;  Surgeon: Hilda Lias, MD;  Location: [facility];  Service: Neurosurgery;  Laterality: Left;  Left L4-5 Foraminotomy/Left L5-S1 Diskectomy    Home Medications: Prior to Admission medications   Medication Sig Start Date End Date Taking? Authorizing Provider  acetaminophen (TYLENOL) 500 MG tablet Take 1,000 mg by mouth every 6 (six) hours as needed.   Yes [provider]  aspirin EC 81 MG tablet Take 81 mg by mouth daily.   Yes [provider]  calcium carbonate (OSCAL) 1500 (600 Ca) MG TABS tablet Take 1 tablet by mouth daily with breakfast.    Yes [provider]  Evolocumab (REPATHA SURECLICK) 140 MG/ML SOAJ Inject 140 mg into the skin every 14 (fourteen) days. 04/28/23  Yes Gollan, Tollie Pizza, MD  FARXIGA 10 MG TABS tablet TAKE 1 TABLET DAILY (HIGHER DOSE) 07/03/19  Yes Poulose, Percell Belt, NP  furosemide (LASIX) 20 MG tablet Take 20 mg by mouth daily. 06/06/21  Yes [provider]  glucose blood (ONE TOUCH ULTRA TEST) test strip USE UP TO FOUR TIMES A DAY AS DIRECTED; Dx 11.51, LON 99 months 03/20/19  Yes Lada, Janit Bern, MD  HUMALOG KWIKPEN 100 UNIT/ML KwikPen INJECT 20 UNITS  UNDER THE SKIN IN THE MORNING AND 35 UNITS WITH LUNCH AND EVENING MEAL (INJECT WITH MEALS) Patient taking differently: INJECT none UNITS UNDER THE SKIN IN THE MORNING AND 20 UNITS WITH LUNCH AND EVENING MEAL (INJECT WITH MEALS) 05/20/20  Yes Danelle Berry, PA-C  Insulin Pen Needle 29G X 12.7MM MISC 1 each by Does not apply route as needed. 04/03/19  Yes Doren Custard, FNP  lamoTRIgine (LAMICTAL) 100 MG tablet TAKE ONE TABLET (100 MG TOTAL) BY MOUTH TWO (TWO) TIMES DAILY. 07/07/23  Yes Jomarie Longs, MD  NON FORMULARY 10 mg at bedtime. Natrol 10 mg   Yes [provider]  PARoxetine (PAXIL) 40 MG tablet TAKE ONE TABLET BY MOUTH EVERY MORNING 04/16/23  Yes Eappen, Levin Bacon, MD  pioglitazone (ACTOS) 30 MG tablet TAKE 1 TABLET DAILY 02/15/20  Yes Danelle Berry, PA-C  potassium chloride (KLOR-CON) 10 MEQ tablet Take 10 mEq by mouth daily. 09/03/22  Yes [provider]  topiramate (TOPAMAX) 100 MG tablet Take 100 mg by mouth 2 (two) times daily. 12/08/22  Yes [provider]  TRESIBA FLEXTOUCH 200 UNIT/ML SOPN Inject 120 Units into the skin at bedtime. Patient taking differently: Inject 140 Units into the skin daily. In the morning 03/09/19  Yes Lada, Janit Bern, MD  clonazePAM (KLONOPIN) 0.5 MG tablet Take 1 tablet (0.5 mg total) by mouth as directed. Take one tablet once a day as needed for severe anxiety 03/10/23 04/09/23  Jomarie Longs, MD    Allergies: Allergies  Allergen Reactions   Clindamycin/Lincomycin     Pt states caused C-Diff   Montelukast Hives and Rash    Asthma attacks   Primidone     Diffused erythematous and itchy rash   Sulfasalazine Hives    Other reaction(s): Unknown   Metformin And Related Diarrhea   Benadryl [Diphenhydramine Hcl] Swelling    blistering   Canagliflozin Other (See Comments)    Other reaction(s): Other (See Comments) Hypotension    Depakote Er [Divalproex Sodium Er] Other (See Comments)    seizures   Dilaudid [Hydromorphone Hcl] Other (See  Comments)    seizures   Diphenhydramine-Zinc Acetate Other (See Comments)   Erythromycin    Ethanol    Haloperidol Other (See Comments)   Neosporin [Neomycin-Bacitracin Zn-Polymyx] Swelling    blistering   Sitagliptin Other (See Comments)   Statins Other (See Comments)    Other reaction(s): myalgias   Sulfa Antibiotics Hives    Other reaction(s): Unknown   Victoza [Liraglutide] Nausea And Vomiting and Other (See Comments)    Other reaction(s): Other (See Comments) pancreatitis     Review of Systems: ROS  Physical Exam BP (!) 150/71   Pulse 77   Temp 98 F (36.7 C)   Ht 5' 5.5" (1.664 m)  Wt (!) 305 lb (138.3 kg)   SpO2 97%   BMI 49.98 kg/m  CONSTITUTIONAL: No acute distress HEENT:  Normocephalic, atraumatic, extraocular motion intact. NECK: The patient has a stable approximately 5 cm mass in the posterior left superior neck about posterior to the auricular area.  The mass is soft, somewhat mobile, without any erythema or tenderness to palpation. RESPIRATORY:  Normal respiratory effort without pathologic use of accessory muscles. CARDIOVASCULAR: Regular rhythm and rate. NEUROLOGIC:  Motor and sensation is grossly normal.  Cranial nerves are grossly intact. PSYCH:  Alert and oriented to person, place and time. Affect is normal.  Labs/Imaging: Hemoglobin A1c on 09/06/2023:  7.5  Assessment and Plan: This is a 62 y.o. female with a posterior left neck lipoma.  - Congratulated the patient on her improvement in control of the diabetes with her A1c now down to 7.5.  Discussed with the anesthesia team and given the improvement, they are okay to proceed forward with her surgery.  Discussed with patient that we can proceed with an excision of her posterior neck lipoma in the operating room as initially planned given the location and potential bleeding issues associated with the mass and the size.  She is in agreement with this. - Reviewed with the patient the surgery at length  and discussed with her the planned incision, risks of bleeding, infection, injury to surrounding structures, that this would be an outpatient procedure, postoperative activity restrictions, pain control, and she is willing to proceed. - We will schedule the patient for surgery on 09/30/2023.  Her last dose of aspirin will be on 09/24/2023.  All of her questions have been answered.  I spent 30 minutes dedicated to the care of this patient on the date of this encounter to include pre-visit review of records, face-to-face time with the patient discussing diagnosis and management, and any post-visit coordination of care.   Howie Ill, MD  [facility]

## 2023-09-22 NOTE — H&P (View-Only) (Signed)
09/22/2023  History of Present Illness: Debra [name] is a 62 y.o. female presenting for follow-up of a left posterior neck lipoma.  The patient was last seen on 05/28/2023 and had been scheduled for surgery on 06/08/2023.  However on preoperative workup by anesthesia team, she was found to have an elevated hemoglobin A1c of 8.9.  As such, and given that this was not an emergent or urgent case, it was decided to cancel her surgery until patient had better diabetes control.  The patient has been following up with her endocrinologist and her most recent hemoglobin A1c which was obtained on 09/06/2023 has gone down to 7.5.  The patient presents today for further discussion to see if it is reasonable to schedule her back for surgery.  The patient reports that overall her symptoms have been stable and she reports that the strap of her CPAP ask continues to push impinge on the posterior, superior left neck.  This still causes discomfort in that area but reports that her symptoms have not worsened.  She is very happy with her improvement in her diabetes control and has made appropriate changes in her diet for this.  Past Medical History: Past Medical History:  Diagnosis Date   Anxiety    Aortic atherosclerosis (HCC)    Arthritis    Bipolar 1 disorder (HCC)    C. difficile colitis    CAD (coronary artery disease)    a.) cCTA 01/10/2021: Ca2+ 171 (95th percentile for age/sex/race match control)   Childhood asthma    DDD (degenerative disc disease), lumbar    a.) s/p LEFT L4-5 foraminotomy/LEFT L5-S1 discectomy 09/2015   Depression    Family history of adverse reaction to anesthesia    a.) intra/post-operative HYPOtension in 1st degree relative (mother)   Head injury, closed, with brief LOC (HCC) 2011ish   Hepatic steatosis    Hyperlipidemia    a.) on PCSK9i (evolumab) for HLD/ASCVD prevention   Hypertension    Infarction of left basal ganglia (HCC)    a.) CT head 10/21/2016: chronic LEFT basal ganglia  lacunar infarct.   Insomnia    a.) on trazodone PRN   Lipoma of neck (LEFT)    Long term current use of aspirin    Myalgia due to statin 03/29/2018   Neuropathy of both feet    Obesity    OSA on CPAP    Parkinson's disease (HCC)    Pneumonia    PTSD (post-traumatic stress disorder)    RBD (REM behavioral disorder)    Seasonal allergies    Seizures (HCC)    TIA (transient ischemic attack) 2005   Tobacco abuse    Type 2 diabetes mellitus treated with insulin [facility])      Past Surgical History: Past Surgical History:  Procedure Laterality Date   ABDOMINAL HYSTERECTOMY  2012   BREAST BIOPSY Right    CESAREAN SECTION  1992   CHOLECYSTECTOMY  2013   LUMBAR LAMINECTOMY/DECOMPRESSION MICRODISCECTOMY Left 09/10/2015   Procedure: Left L4-5 Foraminotomy/Left L5-S1 Diskectomy;  Surgeon: Hilda Lias, MD;  Location: [facility];  Service: Neurosurgery;  Laterality: Left;  Left L4-5 Foraminotomy/Left L5-S1 Diskectomy    Home Medications: Prior to Admission medications   Medication Sig Start Date End Date Taking? Authorizing Provider  acetaminophen (TYLENOL) 500 MG tablet Take 1,000 mg by mouth every 6 (six) hours as needed.   Yes [provider]  aspirin EC 81 MG tablet Take 81 mg by mouth daily.   Yes [provider]  calcium carbonate (OSCAL) 1500 (600 Ca) MG TABS tablet Take 1 tablet by mouth daily with breakfast.    Yes [provider]  Evolocumab (REPATHA SURECLICK) 140 MG/ML SOAJ Inject 140 mg into the skin every 14 (fourteen) days. 04/28/23  Yes Gollan, Tollie Pizza, MD  FARXIGA 10 MG TABS tablet TAKE 1 TABLET DAILY (HIGHER DOSE) 07/03/19  Yes Poulose, Percell Belt, NP  furosemide (LASIX) 20 MG tablet Take 20 mg by mouth daily. 06/06/21  Yes [provider]  glucose blood (ONE TOUCH ULTRA TEST) test strip USE UP TO FOUR TIMES A DAY AS DIRECTED; Dx 11.51, LON 99 months 03/20/19  Yes Lada, Janit Bern, MD  HUMALOG KWIKPEN 100 UNIT/ML KwikPen INJECT 20 UNITS  UNDER THE SKIN IN THE MORNING AND 35 UNITS WITH LUNCH AND EVENING MEAL (INJECT WITH MEALS) Patient taking differently: INJECT none UNITS UNDER THE SKIN IN THE MORNING AND 20 UNITS WITH LUNCH AND EVENING MEAL (INJECT WITH MEALS) 05/20/20  Yes Danelle Berry, PA-C  Insulin Pen Needle 29G X 12.7MM MISC 1 each by Does not apply route as needed. 04/03/19  Yes Doren Custard, FNP  lamoTRIgine (LAMICTAL) 100 MG tablet TAKE ONE TABLET (100 MG TOTAL) BY MOUTH TWO (TWO) TIMES DAILY. 07/07/23  Yes Jomarie Longs, MD  NON FORMULARY 10 mg at bedtime. Natrol 10 mg   Yes [provider]  PARoxetine (PAXIL) 40 MG tablet TAKE ONE TABLET BY MOUTH EVERY MORNING 04/16/23  Yes Eappen, Levin Bacon, MD  pioglitazone (ACTOS) 30 MG tablet TAKE 1 TABLET DAILY 02/15/20  Yes Danelle Berry, PA-C  potassium chloride (KLOR-CON) 10 MEQ tablet Take 10 mEq by mouth daily. 09/03/22  Yes [provider]  topiramate (TOPAMAX) 100 MG tablet Take 100 mg by mouth 2 (two) times daily. 12/08/22  Yes [provider]  TRESIBA FLEXTOUCH 200 UNIT/ML SOPN Inject 120 Units into the skin at bedtime. Patient taking differently: Inject 140 Units into the skin daily. In the morning 03/09/19  Yes Lada, Janit Bern, MD  clonazePAM (KLONOPIN) 0.5 MG tablet Take 1 tablet (0.5 mg total) by mouth as directed. Take one tablet once a day as needed for severe anxiety 03/10/23 04/09/23  Jomarie Longs, MD    Allergies: Allergies  Allergen Reactions   Clindamycin/Lincomycin     Pt states caused C-Diff   Montelukast Hives and Rash    Asthma attacks   Primidone     Diffused erythematous and itchy rash   Sulfasalazine Hives    Other reaction(s): Unknown   Metformin And Related Diarrhea   Benadryl [Diphenhydramine Hcl] Swelling    blistering   Canagliflozin Other (See Comments)    Other reaction(s): Other (See Comments) Hypotension    Depakote Er [Divalproex Sodium Er] Other (See Comments)    seizures   Dilaudid [Hydromorphone Hcl] Other (See  Comments)    seizures   Diphenhydramine-Zinc Acetate Other (See Comments)   Erythromycin    Ethanol    Haloperidol Other (See Comments)   Neosporin [Neomycin-Bacitracin Zn-Polymyx] Swelling    blistering   Sitagliptin Other (See Comments)   Statins Other (See Comments)    Other reaction(s): myalgias   Sulfa Antibiotics Hives    Other reaction(s): Unknown   Victoza [Liraglutide] Nausea And Vomiting and Other (See Comments)    Other reaction(s): Other (See Comments) pancreatitis     Review of Systems: ROS  Physical Exam BP (!) 150/71   Pulse 77   Temp 98 F (36.7 C)   Ht 5' 5.5" (1.664 m)  Wt (!) 305 lb (138.3 kg)   SpO2 97%   BMI 49.98 kg/m  CONSTITUTIONAL: No acute distress HEENT:  Normocephalic, atraumatic, extraocular motion intact. NECK: The patient has a stable approximately 5 cm mass in the posterior left superior neck about posterior to the auricular area.  The mass is soft, somewhat mobile, without any erythema or tenderness to palpation. RESPIRATORY:  Normal respiratory effort without pathologic use of accessory muscles. CARDIOVASCULAR: Regular rhythm and rate. NEUROLOGIC:  Motor and sensation is grossly normal.  Cranial nerves are grossly intact. PSYCH:  Alert and oriented to person, place and time. Affect is normal.  Labs/Imaging: Hemoglobin A1c on 09/06/2023:  7.5  Assessment and Plan: This is a 62 y.o. female with a posterior left neck lipoma.  - Congratulated the patient on her improvement in control of the diabetes with her A1c now down to 7.5.  Discussed with the anesthesia team and given the improvement, they are okay to proceed forward with her surgery.  Discussed with patient that we can proceed with an excision of her posterior neck lipoma in the operating room as initially planned given the location and potential bleeding issues associated with the mass and the size.  She is in agreement with this. - Reviewed with the patient the surgery at length  and discussed with her the planned incision, risks of bleeding, infection, injury to surrounding structures, that this would be an outpatient procedure, postoperative activity restrictions, pain control, and she is willing to proceed. - We will schedule the patient for surgery on 09/30/2023.  Her last dose of aspirin will be on 09/24/2023.  All of her questions have been answered.  I spent 30 minutes dedicated to the care of this patient on the date of this encounter to include pre-visit review of records, face-to-face time with the patient discussing diagnosis and management, and any post-visit coordination of care.   Howie Ill, MD  [facility]

## 2023-09-23 ENCOUNTER — Telehealth: Payer: Self-pay | Admitting: Surgery

## 2023-09-23 NOTE — Telephone Encounter (Signed)
Patient calls back, she is informed of all dates regarding her surgery.

## 2023-09-23 NOTE — Telephone Encounter (Signed)
Left message for patient to call, please inform her of the following regarding scheduled surgery with Dr. Aleen Campi.   Pre-Admission date/time, and Surgery date at [facility].  Surgery Date: 09/30/23 Preadmission Testing Date: 09/27/23 (phone 8a-1p)  Also patient will need to call at [phone], between 1-3:00pm the day before surgery, to find out what time to arrive for surgery.

## 2023-09-27 ENCOUNTER — Encounter
Admission: RE | Admit: 2023-09-27 | Discharge: 2023-09-27 | Disposition: A | Discharge status: Home / Self Care | Payer: Medicare HMO | Source: Ambulatory Visit | Attending: Surgery | Admitting: Surgery

## 2023-09-27 ENCOUNTER — Other Ambulatory Visit: Payer: Self-pay | Admitting: Psychiatry

## 2023-09-27 ENCOUNTER — Other Ambulatory Visit: Payer: Self-pay

## 2023-09-27 VITALS — Ht 65.25 in | Wt 305.0 lb

## 2023-09-27 DIAGNOSIS — Z01812 Encounter for preprocedural laboratory examination: Secondary | ICD-10-CM

## 2023-09-27 DIAGNOSIS — F4310 Post-traumatic stress disorder, unspecified: Secondary | ICD-10-CM

## 2023-09-27 DIAGNOSIS — Z794 Long term (current) use of insulin: Secondary | ICD-10-CM

## 2023-09-27 DIAGNOSIS — E782 Mixed hyperlipidemia: Secondary | ICD-10-CM

## 2023-09-27 NOTE — Patient Instructions (Addendum)
Your procedure is scheduled [id number] October 24  Report to the Registration Desk on the 1st floor of the [facility] To find out your arrival time, please call [phone] between 1PM - 3PM on: Wednesday October 23  If your arrival time is 6:00 am, do not arrive before that time as the [facility] entrance doors do not open until 6:00 am.  REMEMBER: Instructions that are not followed completely may result in serious medical risk, up to and including death; or upon the discretion of your surgeon and anesthesiologist your surgery may need to be rescheduled.  Do not eat food after midnight the night before surgery.  No gum chewing or hard candies.  You may however, drink WATER up to 2 hours before you are scheduled to arrive for your surgery. Do not drink anything within 2 hours of your scheduled arrival time.  One week prior to surgery: Thursday October 17  Stop Anti-inflammatories (NSAIDS) such as Advil, Aleve, Ibuprofen, Motrin, Naproxen, Naprosyn and Aspirin based products such as Excedrin, Goody's Powder, BC Powder. Stop ANY OVER THE COUNTER supplements until after surgery. calcium carbonate (OSCAL)    You may however, continue to take Tylenol if needed for pain up until the day of surgery.  **Follow guidelines for insulin and diabetes medications.** FARXIGA hold 3 days prior to surgery, last dose Last dose Sunday October 20  HUMALOG [facility]  do not take the morning of surgery    the normal dose of insulin the night prior to surgery NO INSULIN MORNING OF SURGERY **Follow recommendations regarding stopping blood thinners.** aspirin EC 81 MG tablet last dose Friday October 18   Continue taking all of your other prescription medications up until the day of surgery.  ON THE DAY OF SURGERY ONLY TAKE THESE MEDICATIONS WITH SIPS OF WATER:  lamoTRIgine (LAMICTAL)  PARoxetine (PAXIL)  topiramate (TOPAMAX)   No Alcohol for 24 hours before or after surgery.  No Smoking  including e-cigarettes for 24 hours before surgery.  No chewable tobacco products for at least 6 hours before surgery.  No nicotine patches on the day of surgery.  Do not use any "recreational" drugs for at least a week (preferably 2 weeks) before your surgery.  Please be advised that the combination of cocaine and anesthesia may have negative outcomes, up to and including death. If you test positive for cocaine, your surgery will be cancelled.  On the morning of surgery brush your teeth with toothpaste and water, you may rinse your mouth with mouthwash if you wish. Do not swallow any toothpaste or mouthwash.  Use CHG Soap as directed on instruction sheet.  Do not wear jewelry, make-up, hairpins, clips or nail polish.  For welded (permanent) jewelry: bracelets, anklets, waist bands, etc.  Please have this removed prior to surgery.  If it is not removed, there is a chance that hospital personnel will need to cut it off on the day of surgery.  Do not wear lotions, powders, or perfumes.   Do not shave body hair from the neck down 48 hours before surgery.  Contact lenses, hearing aids and dentures may not be worn into surgery.  Do not bring valuables to the hospital. [facility] is not responsible for any missing/lost belongings or valuables.   Notify your doctor if there is any change in your medical condition (cold, fever, infection).  Wear comfortable clothing (specific to your surgery type) to the hospital.  After surgery, you can help prevent lung complications by doing breathing exercises.  Take deep breaths and cough every 1-2 hours.   If you are being discharged the day of surgery, you will not be allowed to drive home. You will need a responsible individual to drive you home and stay with you for 24 hours after surgery.   If you are taking public transportation, you will need to have a responsible individual with you.  Please call the Pre-admissions Testing Dept. at [phone] if you have any questions about these instructions.  Surgery Visitation Policy:  Patients having surgery or a procedure may have two visitors.  Children under the age of 27 must have an adult with them who is not the patient.           Preparing for Surgery with CHLORHEXIDINE GLUCONATE (CHG) Soap  Chlorhexidine Gluconate (CHG) Soap  o An antiseptic cleaner that kills germs and bonds with the skin to continue killing germs even after washing  o Used for showering the night before surgery and morning of surgery  Before surgery, you can play an important role by reducing the number of germs on your skin.  CHG (Chlorhexidine gluconate) soap is an antiseptic cleanser which kills germs and bonds with the skin to continue killing germs even after washing.  Please do not use if you have an allergy to CHG or antibacterial soaps. If your skin becomes reddened/irritated stop using the CHG.  1. Shower the NIGHT BEFORE SURGERY and the MORNING OF SURGERY with CHG soap.  2. If you choose to wash your hair, wash your hair first as usual with your normal shampoo.  3. After shampooing, rinse your hair and body thoroughly to remove the shampoo.  4. Use CHG as you would any other liquid soap. You can apply CHG directly to the skin and wash gently with a scrungie or a clean washcloth.  5. Apply the CHG soap to your body only from the neck down. Do not use on open wounds or open sores. Avoid contact with your eyes, ears, mouth, and genitals (private parts). Wash face and genitals (private parts) with your normal soap.  6. Wash thoroughly, paying special attention to the area where your surgery will be performed.  7. Thoroughly rinse your body with warm water.  8. Do not shower/wash with your normal soap after using and rinsing off the CHG soap.  9. Pat yourself dry with a clean towel.  10. Wear clean pajamas to bed the night before surgery.  12. Place clean sheets on your bed the  night of your first shower and do not sleep with pets.  13. Shower again with the CHG soap on the day of surgery prior to arriving at the hospital.  14. Do not apply any deodorants/lotions/powders.  15. Please wear clean clothes to the hospital.

## 2023-09-28 ENCOUNTER — Encounter
Admission: RE | Admit: 2023-09-28 | Discharge: 2023-09-28 | Disposition: A | Discharge status: Home / Self Care | Payer: Medicare HMO | Source: Ambulatory Visit | Attending: Surgery | Admitting: Surgery

## 2023-09-28 DIAGNOSIS — Z01812 Encounter for preprocedural laboratory examination: Secondary | ICD-10-CM

## 2023-09-28 DIAGNOSIS — E119 Type 2 diabetes mellitus without complications: Secondary | ICD-10-CM

## 2023-09-28 DIAGNOSIS — E782 Mixed hyperlipidemia: Secondary | ICD-10-CM | POA: Diagnosis not present

## 2023-09-28 DIAGNOSIS — Z794 Long term (current) use of insulin: Secondary | ICD-10-CM | POA: Insufficient documentation

## 2023-09-28 LAB — CBC
HCT: 47 % — ABNORMAL HIGH (ref 36.0–46.0)
Hemoglobin: 15.3 g/dL — ABNORMAL HIGH (ref 12.0–15.0)
MCH: 32 pg (ref 26.0–34.0)
MCHC: 32.6 g/dL (ref 30.0–36.0)
MCV: 98.3 fL (ref 80.0–100.0)
Platelets: 250 10*3/uL (ref 150–400)
RBC: 4.78 MIL/uL (ref 3.87–5.11)
RDW: 13.4 % (ref 11.5–15.5)
WBC: 5.9 10*3/uL (ref 4.0–10.5)
nRBC: 0 % (ref 0.0–0.2)

## 2023-09-28 LAB — BASIC METABOLIC PANEL
Anion gap: 10 (ref 5–15)
BUN: 14 mg/dL (ref 6–20)
CO2: 20 mmol/L — ABNORMAL LOW (ref 22–32)
Calcium: 8.7 mg/dL — ABNORMAL LOW (ref 8.9–10.3)
Chloride: 110 mmol/L (ref 98–111)
Creatinine, Ser: 0.7 mg/dL (ref 0.44–1.00)
GFR, Estimated: >60 mL/min (ref >60)
Glucose, Bld: 141 mg/dL — ABNORMAL HIGH (ref 70–99)
Potassium: 3.6 mmol/L (ref 3.5–5.1)
Sodium: 140 mmol/L (ref 135–145)

## 2023-09-30 ENCOUNTER — Ambulatory Visit
Admission: RE | Admit: 2023-09-30 | Discharge: 2023-09-30 | Disposition: A | Discharge status: Home / Self Care | Payer: Medicare HMO | Source: Ambulatory Visit | Attending: Surgery | Admitting: Surgery

## 2023-09-30 ENCOUNTER — Other Ambulatory Visit: Payer: Self-pay

## 2023-09-30 ENCOUNTER — Ambulatory Visit: Payer: Medicare HMO | Admitting: Urgent Care

## 2023-09-30 ENCOUNTER — Ambulatory Visit: Payer: Self-pay | Admitting: Urgent Care

## 2023-09-30 ENCOUNTER — Encounter
Admission: RE | Disposition: A | Discharge status: Home / Self Care | Payer: Self-pay | Source: Ambulatory Visit | Attending: Surgery

## 2023-09-30 ENCOUNTER — Encounter: Payer: Self-pay | Admitting: Surgery

## 2023-09-30 DIAGNOSIS — D170 Benign lipomatous neoplasm of skin and subcutaneous tissue of head, face and neck: Secondary | ICD-10-CM | POA: Diagnosis not present

## 2023-09-30 DIAGNOSIS — J45909 Unspecified asthma, uncomplicated: Secondary | ICD-10-CM | POA: Insufficient documentation

## 2023-09-30 DIAGNOSIS — I10 Essential (primary) hypertension: Secondary | ICD-10-CM | POA: Diagnosis not present

## 2023-09-30 DIAGNOSIS — G4730 Sleep apnea, unspecified: Secondary | ICD-10-CM | POA: Insufficient documentation

## 2023-09-30 DIAGNOSIS — Z6841 Body Mass Index (BMI) 40.0 and over, adult: Secondary | ICD-10-CM | POA: Diagnosis not present

## 2023-09-30 DIAGNOSIS — M1990 Unspecified osteoarthritis, unspecified site: Secondary | ICD-10-CM | POA: Diagnosis not present

## 2023-09-30 DIAGNOSIS — I2510 Atherosclerotic heart disease of native coronary artery without angina pectoris: Secondary | ICD-10-CM | POA: Insufficient documentation

## 2023-09-30 DIAGNOSIS — E119 Type 2 diabetes mellitus without complications: Secondary | ICD-10-CM | POA: Diagnosis not present

## 2023-09-30 DIAGNOSIS — Z7984 Long term (current) use of oral hypoglycemic drugs: Secondary | ICD-10-CM | POA: Insufficient documentation

## 2023-09-30 DIAGNOSIS — F319 Bipolar disorder, unspecified: Secondary | ICD-10-CM | POA: Insufficient documentation

## 2023-09-30 DIAGNOSIS — Z794 Long term (current) use of insulin: Secondary | ICD-10-CM | POA: Insufficient documentation

## 2023-09-30 DIAGNOSIS — F17200 Nicotine dependence, unspecified, uncomplicated: Secondary | ICD-10-CM | POA: Diagnosis not present

## 2023-09-30 DIAGNOSIS — Z01812 Encounter for preprocedural laboratory examination: Secondary | ICD-10-CM

## 2023-09-30 HISTORY — PX: LIPOMA EXCISION: SHX5283

## 2023-09-30 LAB — GLUCOSE, CAPILLARY
Glucose-Capillary: 126 mg/dL — ABNORMAL HIGH (ref 70–99)
Glucose-Capillary: 105 mg/dL — ABNORMAL HIGH (ref 70–99)

## 2023-09-30 SURGERY — EXCISION LIPOMA
Anesthesia: General | Laterality: Left

## 2023-09-30 MED ORDER — ACETAMINOPHEN 500 MG PO TABS: 1000.0000 mg | ORAL_TABLET | Freq: Four times a day (QID) | ORAL | Status: AC | PRN

## 2023-09-30 MED ORDER — FENTANYL CITRATE (PF) 100 MCG/2ML IJ SOLN: 25.0000 ug | INTRAMUSCULAR | Status: DC | PRN

## 2023-09-30 MED ORDER — LIDOCAINE HCL (CARDIAC) PF 100 MG/5ML IV SOSY
PREFILLED_SYRINGE | INTRAVENOUS | Status: DC | PRN
  Administered 2023-09-30: 70 mg via INTRAVENOUS

## 2023-09-30 MED ORDER — ALBUTEROL SULFATE HFA 108 (90 BASE) MCG/ACT IN AERS
INHALATION_SPRAY | RESPIRATORY_TRACT | Status: AC
  Filled 2023-09-30: qty 6.7

## 2023-09-30 MED ORDER — DEXMEDETOMIDINE HCL IN NACL 80 MCG/20ML IV SOLN
INTRAVENOUS | Status: DC | PRN
  Administered 2023-09-30: 8 ug via INTRAVENOUS
  Administered 2023-09-30: 20 ug via INTRAVENOUS

## 2023-09-30 MED ORDER — DEXMEDETOMIDINE HCL IN NACL 80 MCG/20ML IV SOLN
INTRAVENOUS | Status: AC
  Filled 2023-09-30: qty 20

## 2023-09-30 MED ORDER — ONDANSETRON HCL 4 MG/2ML IJ SOLN
INTRAMUSCULAR | Status: DC | PRN
  Administered 2023-09-30: 4 mg via INTRAVENOUS

## 2023-09-30 MED ORDER — BUPIVACAINE LIPOSOME 1.3 % IJ SUSP
INTRAMUSCULAR | Status: AC
  Filled 2023-09-30: qty 10

## 2023-09-30 MED ORDER — SUGAMMADEX SODIUM 200 MG/2ML IV SOLN
INTRAVENOUS | Status: DC | PRN
  Administered 2023-09-30: 100 mg via INTRAVENOUS
  Administered 2023-09-30: 200 mg via INTRAVENOUS

## 2023-09-30 MED ORDER — FENTANYL CITRATE (PF) 100 MCG/2ML IJ SOLN
INTRAMUSCULAR | Status: AC
  Filled 2023-09-30: qty 2

## 2023-09-30 MED ORDER — CEFAZOLIN IN SODIUM CHLORIDE 3-0.9 GM/100ML-% IV SOLN
3.0000 g | INTRAVENOUS | Status: DC
  Filled 2023-09-30: qty 100

## 2023-09-30 MED ORDER — ACETAMINOPHEN 500 MG PO TABS
ORAL_TABLET | ORAL | Status: AC
  Filled 2023-09-30: qty 2

## 2023-09-30 MED ORDER — SUCCINYLCHOLINE CHLORIDE 200 MG/10ML IV SOSY
PREFILLED_SYRINGE | INTRAVENOUS | Status: DC | PRN
  Administered 2023-09-30: 120 mg via INTRAVENOUS

## 2023-09-30 MED ORDER — SODIUM CHLORIDE 0.9 % IV SOLN: INTRAVENOUS | Status: DC

## 2023-09-30 MED ORDER — GABAPENTIN 300 MG PO CAPS
ORAL_CAPSULE | ORAL | Status: AC
  Filled 2023-09-30: qty 1

## 2023-09-30 MED ORDER — OXYCODONE HCL 5 MG PO TABS: 5.0000 mg | ORAL_TABLET | ORAL | 0 refills | Status: DC | PRN

## 2023-09-30 MED ORDER — OXYCODONE HCL 5 MG PO TABS: 5.0000 mg | ORAL_TABLET | Freq: Once | ORAL | Status: DC | PRN

## 2023-09-30 MED ORDER — DEXTROSE 5 % IV SOLN
INTRAVENOUS | Status: DC | PRN
  Administered 2023-09-30: 3 g via INTRAVENOUS

## 2023-09-30 MED ORDER — PROPOFOL 10 MG/ML IV BOLUS
INTRAVENOUS | Status: DC | PRN
  Administered 2023-09-30: 50 mg via INTRAVENOUS
  Administered 2023-09-30: 150 mg via INTRAVENOUS

## 2023-09-30 MED ORDER — ROCURONIUM BROMIDE 10 MG/ML (PF) SYRINGE
PREFILLED_SYRINGE | INTRAVENOUS | Status: AC
  Filled 2023-09-30: qty 10

## 2023-09-30 MED ORDER — BUPIVACAINE LIPOSOME 1.3 % IJ SUSP
INTRAMUSCULAR | Status: DC | PRN
  Administered 2023-09-30: 30 mL

## 2023-09-30 MED ORDER — FENTANYL CITRATE (PF) 100 MCG/2ML IJ SOLN
INTRAMUSCULAR | Status: DC | PRN
  Administered 2023-09-30 (×4): 25 ug via INTRAVENOUS

## 2023-09-30 MED ORDER — KETOROLAC TROMETHAMINE 30 MG/ML IJ SOLN
INTRAMUSCULAR | Status: DC | PRN
  Administered 2023-09-30: 30 mg via INTRAVENOUS

## 2023-09-30 MED ORDER — OXYCODONE HCL 5 MG/5ML PO SOLN
5.0000 mg | Freq: Once | ORAL | Status: DC | PRN
Start: 2023-09-30 — End: 2023-09-30

## 2023-09-30 MED ORDER — LABETALOL HCL 5 MG/ML IV SOLN
INTRAVENOUS | Status: AC
  Filled 2023-09-30: qty 4

## 2023-09-30 MED ORDER — LIDOCAINE HCL (PF) 2 % IJ SOLN
INTRAMUSCULAR | Status: AC
  Filled 2023-09-30: qty 5

## 2023-09-30 MED ORDER — KETOROLAC TROMETHAMINE 30 MG/ML IJ SOLN
INTRAMUSCULAR | Status: AC
  Filled 2023-09-30: qty 1

## 2023-09-30 MED ORDER — LABETALOL HCL 5 MG/ML IV SOLN
INTRAVENOUS | Status: DC | PRN
  Administered 2023-09-30 (×2): 5 mg via INTRAVENOUS

## 2023-09-30 MED ORDER — BUPIVACAINE LIPOSOME 1.3 % IJ SUSP: 20.0000 mL | Freq: Once | INTRAMUSCULAR | Status: DC

## 2023-09-30 MED ORDER — DEXAMETHASONE SODIUM PHOSPHATE 10 MG/ML IJ SOLN
INTRAMUSCULAR | Status: DC | PRN
  Administered 2023-09-30: 6 mg via INTRAVENOUS

## 2023-09-30 MED ORDER — ROCURONIUM BROMIDE 100 MG/10ML IV SOLN
INTRAVENOUS | Status: DC | PRN
  Administered 2023-09-30: 45 mg via INTRAVENOUS

## 2023-09-30 MED ORDER — BUPIVACAINE HCL (PF) 0.5 % IJ SOLN
INTRAMUSCULAR | Status: AC
  Filled 2023-09-30: qty 30

## 2023-09-30 MED ORDER — ONDANSETRON HCL 4 MG/2ML IJ SOLN
INTRAMUSCULAR | Status: AC
  Filled 2023-09-30: qty 2

## 2023-09-30 MED ORDER — ONDANSETRON HCL 4 MG/2ML IJ SOLN: 4.0000 mg | Freq: Once | INTRAMUSCULAR | Status: DC | PRN

## 2023-09-30 MED ORDER — CHLORHEXIDINE GLUCONATE CLOTH 2 % EX PADS: 6.0000 | MEDICATED_PAD | Freq: Once | CUTANEOUS | Status: DC

## 2023-09-30 MED ORDER — ACETAMINOPHEN 500 MG PO TABS
1000.0000 mg | ORAL_TABLET | ORAL | Status: AC
  Administered 2023-09-30: 1000 mg via ORAL

## 2023-09-30 MED ORDER — GABAPENTIN 300 MG PO CAPS
300.0000 mg | ORAL_CAPSULE | ORAL | Status: AC
  Administered 2023-09-30: 300 mg via ORAL

## 2023-09-30 MED ORDER — DEXAMETHASONE SODIUM PHOSPHATE 10 MG/ML IJ SOLN
INTRAMUSCULAR | Status: AC
  Filled 2023-09-30: qty 1

## 2023-09-30 MED ORDER — ALBUTEROL SULFATE HFA 108 (90 BASE) MCG/ACT IN AERS
INHALATION_SPRAY | RESPIRATORY_TRACT | Status: DC | PRN
  Administered 2023-09-30: 4 via RESPIRATORY_TRACT

## 2023-09-30 MED ORDER — SUCCINYLCHOLINE CHLORIDE 200 MG/10ML IV SOSY
PREFILLED_SYRINGE | INTRAVENOUS | Status: AC
  Filled 2023-09-30: qty 10

## 2023-09-30 SURGICAL SUPPLY — 33 items
ADH SKN CLS APL DERMABOND .7 (GAUZE/BANDAGES/DRESSINGS) ×1
APL PRP STRL LF DISP 70% ISPRP (MISCELLANEOUS) ×1
CHLORAPREP W/TINT 26 (MISCELLANEOUS) ×1 IMPLANT
DERMABOND ADVANCED .7 DNX12 (GAUZE/BANDAGES/DRESSINGS) ×1 IMPLANT
DRAPE LAPAROTOMY 100X77 ABD (DRAPES) ×1 IMPLANT
DRAPE SHEET LG 3/4 BI-LAMINATE (DRAPES) ×2 IMPLANT
ELECT CAUTERY BLADE TIP 2.5 (TIP) ×1
ELECT REM PT RETURN 9FT ADLT (ELECTROSURGICAL) ×1
ELECTRODE CAUTERY BLDE TIP 2.5 (TIP) ×1 IMPLANT
ELECTRODE REM PT RTRN 9FT ADLT (ELECTROSURGICAL) ×1 IMPLANT
GAUZE 4X4 16PLY ~~LOC~~+RFID DBL (SPONGE) ×1 IMPLANT
GLOVE SURG SYN 7.0 (GLOVE) ×1 IMPLANT
GLOVE SURG SYN 7.0 PF PI (GLOVE) ×1 IMPLANT
GLOVE SURG SYN 7.5 E (GLOVE) ×1 IMPLANT
GLOVE SURG SYN 7.5 PF PI (GLOVE) ×1 IMPLANT
GOWN STRL REUS W/ TWL LRG LVL3 (GOWN DISPOSABLE) ×2 IMPLANT
GOWN STRL REUS W/TWL LRG LVL3 (GOWN DISPOSABLE) ×2
KIT TURNOVER KIT A (KITS) ×1 IMPLANT
LABEL OR SOLS (LABEL) ×1 IMPLANT
MANIFOLD NEPTUNE II (INSTRUMENTS) ×1 IMPLANT
NDL HYPO 22X1.5 SAFETY MO (MISCELLANEOUS) ×1 IMPLANT
NEEDLE HYPO 22X1.5 SAFETY MO (MISCELLANEOUS) ×1 IMPLANT
NS IRRIG 1000ML POUR BTL (IV SOLUTION) ×1 IMPLANT
PACK BASIN MINOR ARMC (MISCELLANEOUS) ×1 IMPLANT
SUT MNCRL 4-0 (SUTURE) ×2
SUT MNCRL 4-0 27XMFL (SUTURE) ×2
SUT VIC AB 0 SH 27 (SUTURE) ×2 IMPLANT
SUT VIC AB 3-0 SH 27 (SUTURE) ×1
SUT VIC AB 3-0 SH 27X BRD (SUTURE) ×2 IMPLANT
SUTURE MNCRL 4-0 27XMF (SUTURE) ×1 IMPLANT
SYR 30ML LL (SYRINGE) ×1 IMPLANT
TRAP FLUID SMOKE EVACUATOR (MISCELLANEOUS) ×1 IMPLANT
WATER STERILE IRR 500ML POUR (IV SOLUTION) ×1 IMPLANT

## 2023-09-30 NOTE — Anesthesia Preprocedure Evaluation (Addendum)
Anesthesia Evaluation  Patient identified by MRN, date of birth, ID band Patient awake    Reviewed: Allergy & Precautions, NPO status , Patient's Chart, lab work & pertinent test results  Airway Mallampati: IV  TM Distance: >3 FB Neck ROM: full    Dental  (+) Teeth Intact, Caps, Dental Advisory Given   Pulmonary asthma , sleep apnea and Continuous Positive Airway Pressure Ventilation , Current Smoker   Pulmonary exam normal  + decreased breath sounds      Cardiovascular Exercise Tolerance: Poor hypertension, Pt. on medications + CAD  Normal cardiovascular exam Rhythm:Regular Rate:Normal     Neuro/Psych    Depression Bipolar Disorder   TIAnegative neurological ROS  negative psych ROS   GI/Hepatic negative GI ROS, Neg liver ROS,,,  Endo/Other  diabetes, Well Controlled, Type 1, Insulin Dependent  Morbid obesity  Renal/GU      Musculoskeletal  (+) Arthritis ,    Abdominal  (+) + obese  Peds  Hematology negative hematology ROS (+)   Anesthesia Other Findings Past Medical History: No date: Anxiety No date: Aortic atherosclerosis (HCC) No date: Arthritis No date: Bipolar 1 disorder (HCC) No date: C. difficile colitis No date: CAD (coronary artery disease)     Comment:  a.) cCTA 01/10/2021: Ca2+ 171 (95th percentile for               age/sex/race match control) No date: Childhood asthma No date: DDD (degenerative disc disease), lumbar     Comment:  a.) s/p LEFT L4-5 foraminotomy/LEFT L5-S1 discectomy               09/2015 No date: Depression No date: Family history of adverse reaction to anesthesia     Comment:  a.) intra/post-operative HYPOtension in 1st degree               relative (mother) 2011ish: Head injury, closed, with brief LOC (HCC) No date: Hepatic steatosis No date: Hyperlipidemia     Comment:  a.) on PCSK9i (evolumab) for HLD/ASCVD prevention No date: Hypertension No date: Infarction of left  basal ganglia (HCC)     Comment:  a.) CT head 10/21/2016: chronic LEFT basal ganglia               lacunar infarct. No date: Insomnia     Comment:  a.) on trazodone PRN No date: Lipoma of neck (LEFT) No date: Long term current use of aspirin 03/29/2018: Myalgia due to statin No date: Neuropathy of both feet No date: Obesity No date: OSA on CPAP No date: Parkinson's disease (HCC) No date: Pneumonia No date: PTSD (post-traumatic stress disorder) No date: RBD (REM behavioral disorder) No date: Seasonal allergies No date: Seizures (HCC) 2005: TIA (transient ischemic attack) No date: Tobacco abuse No date: Type 2 diabetes mellitus treated with insulin (HCC)  Past Surgical History: 2012: ABDOMINAL HYSTERECTOMY No date: BACK SURGERY     Comment:  2017 No date: BREAST BIOPSY; Right 1992: CESAREAN SECTION 2013: CHOLECYSTECTOMY 09/10/2015: LUMBAR LAMINECTOMY/DECOMPRESSION MICRODISCECTOMY; Left     Comment:  Procedure: Left L4-5 Foraminotomy/Left L5-S1 Diskectomy;              Surgeon: Hilda Lias, MD;  Location: [facility];                Service: Neurosurgery;  Laterality: Left;  Left L4-5               Foraminotomy/Left L5-S1 Diskectomy     Reproductive/Obstetrics negative OB ROS  Anesthesia Physical Anesthesia Plan  ASA: 3  Anesthesia Plan: General   Post-op Pain Management:    Induction: Intravenous  PONV Risk Score and Plan: 1 and Ondansetron and Dexamethasone  Airway Management Planned: Oral ETT  Additional Equipment:   Intra-op Plan:   Post-operative Plan: Extubation in OR  Informed Consent: I have reviewed the patients History and Physical, chart, labs and discussed the procedure including the risks, benefits and alternatives for the proposed anesthesia with the patient or authorized representative who has indicated his/her understanding and acceptance.     Dental Advisory Given  Plan Discussed  with: CRNA and Surgeon  Anesthesia Plan Comments:          Anesthesia Quick Evaluation

## 2023-09-30 NOTE — Op Note (Signed)
  Procedure Date:  09/30/2023  Pre-operative Diagnosis:  Left posterior neck lipoma  Post-operative Diagnosis: Left posterior neck lipoma, 4 x 6 cm.  Procedure:  Excision of left posterior neck lipoma  Surgeon:  Howie Ill, MD  Anesthesia:  General endotracheal  Estimated Blood Loss:  15 ml  Specimens:  Posterior neck lipoma  Complications:  None  Indications for Procedure:  This is a 62 y.o. female with diagnosis of a symptomatic posterior neck lipoma.  The patient wishes to have this excised. The risks of bleeding, abscess or infection, injury to surrounding structures, and need for further procedures were all discussed with the patient and he was willing to proceed.  Description of Procedure: The patient was correctly identified in the preoperative area and brought into the operating room.  The patient was placed supine with VTE prophylaxis in place.  Appropriate time-outs were performed.  Anesthesia was induced and the patient was intubated.  Appropriate antibiotics were infused.  The patient's left neck was prepped and draped in usual sterile fashion.  A 7 cm incision was made over the lipoma, and cautery was used to dissect down the subcutaneous tissue to the lipoma itself.  Skin flaps were created using cautery as well, and then the lipoma was excised using cautery, intact.  It was sent off to pathology.  The cavity was then irrigated and hemostasis was assured with cautery.  Local anesthetic was infused intradermally.  The skin flaps were then approximated to the wound bed using multiple 0 Vicryl sutures in order to decrease the amount of dead space in the wound.  The wound edges were then closed in three layers using 0 Vicryl, 3-0 Vicryl and 4-0 Monocryl.  The incision was cleaned and sealed with DermaBond.  The patient was then emerged from anesthesia, extubated, and brought to the recovery room for further management.    The patient tolerated the procedure well and all  counts were correct at the end of the case.   Howie Ill, MD

## 2023-09-30 NOTE — Anesthesia Procedure Notes (Signed)
Procedure Name: Intubation Date/Time: 09/30/2023 3:02 PM  Performed by: Jeannene Patella, CRNAPre-anesthesia Checklist: Patient identified, Emergency Drugs available, Suction available, Patient being monitored and Timeout performed Patient Re-evaluated:Patient Re-evaluated prior to induction Oxygen Delivery Method: Circle system utilized Preoxygenation: Pre-oxygenation with 100% oxygen Induction Type: IV induction Ventilation: Mask ventilation without difficulty and Oral airway inserted - appropriate to patient size Laryngoscope Size: McGraph and 4 Grade View: Grade III Tube type: Oral Tube size: 7.0 mm Number of attempts: 1 Airway Equipment and Method: Patient positioned with wedge pillow, Stylet, LTA kit utilized and Video-laryngoscopy Placement Confirmation: ETT inserted through vocal cords under direct vision, positive ETCO2 and breath sounds checked- equal and bilateral Secured at: 20.5 (AT LIP) cm Dental Injury: Teeth and Oropharynx as per pre-operative assessment  Future Recommendations: Recommend- induction with short-acting agent, and alternative techniques readily available Comments: Upper back area ramped up for intubation

## 2023-09-30 NOTE — Anesthesia Postprocedure Evaluation (Signed)
Anesthesia Post Note  Patient: Debra [name]  Procedure(s) Performed: EXCISION LIPOMA (Left)  Patient location during evaluation: PACU Anesthesia Type: General Level of consciousness: awake and alert, oriented and patient cooperative Pain management: pain level controlled Vital Signs Assessment: post-procedure vital signs reviewed and stable Respiratory status: spontaneous breathing, nonlabored ventilation and respiratory function stable Cardiovascular status: blood pressure returned to baseline and stable Postop Assessment: adequate PO intake Anesthetic complications: no   No notable events documented.   Last Vitals:  Vitals:   09/30/23 1655 09/30/23 1700  BP:    Pulse: 73 70  Resp: (!) 22 (!) 23  Temp:  37 C  SpO2: 97% 95%    Last Pain:  Vitals:   09/30/23 1700  PainSc: 0-No pain                 Reed Breech

## 2023-09-30 NOTE — Interval H&P Note (Signed)
History and Physical Interval Note:  09/30/2023 12:51 PM  Debra [name]  has presented today for surgery, with the diagnosis of neck lipoma 5 cm.  The various methods of treatment have been discussed with the patient and family. After consideration of risks, benefits and other options for treatment, the patient has consented to  Procedure(s) with comments: EXCISION LIPOMA (Left) - neck as a surgical intervention.  The patient's history has been reviewed, patient examined, no change in status, stable for surgery.  I have reviewed the patient's chart and labs.  Questions were answered to the patient's satisfaction.     Graylyn Bunney

## 2023-09-30 NOTE — Transfer of Care (Signed)
Immediate Anesthesia Transfer of Care Note  Patient: Debra [name]  Procedure(s) Performed: EXCISION LIPOMA (Left)  Patient Location: PACU  Anesthesia Type:General  Level of Consciousness: awake, oriented, and patient cooperative  Airway & Oxygen Therapy: Patient Spontanous Breathing and Patient connected to face mask oxygen  Post-op Assessment: Report given to RN and Post -op Vital signs reviewed and stable  Post vital signs: Reviewed and stable  Last Vitals:  Vitals Value Taken Time  BP 142/83 09/30/23 1636  Temp    Pulse 72 09/30/23 1640  Resp 16 09/30/23 1640  SpO2 100 % 09/30/23 1640  Vitals shown include unfiled device data.  Last Pain:  Vitals:   09/30/23 1249  PainSc: 0-No pain         Complications: No notable events documented.

## 2023-09-30 NOTE — Discharge Instructions (Addendum)
Discharge Instructions: 1.  Patient may shower, but do not scrub wounds heavily and dab dry only. 2.  Do not submerge wounds in pool/tub until fully healed. 3.  Do not apply ointments or hydrogen peroxide to the wounds. 4.  May apply ice packs to the wounds for comfort. 5.  Avoid neck movements from side to side, twisting, or stretching that would pull on the incision x 2 weeks. 6.  May resume your Aspirin on 10/02/23. 7.  Do not drive while taking narcotics for pain control.  Prior to driving, make sure you are able to rotate right and left to look at blindspots without significant pain or discomfort.  AMBULATORY SURGERY  DISCHARGE INSTRUCTIONS   The drugs that you were given will stay in your system until tomorrow so for the next 24 hours you should not:  Drive an automobile Make any legal decisions Drink any alcoholic beverage   You may resume regular meals tomorrow.  Today it is better to start with liquids and gradually work up to solid foods.  You may eat anything you prefer, but it is better to start with liquids, then soup and crackers, and gradually work up to solid foods.   Please notify your doctor immediately if you have any unusual bleeding, trouble breathing, redness and pain at the surgery site, drainage, fever, or pain not relieved by medication.    Additional Instructions: PLEASE LEAVE EXPAREL (TEAL) ARMBAND ON FOR 4 DAYS   Please contact your physician with any problems or Same Day Surgery at [phone], Monday through Friday 6 am to 4 pm, or [place] at [facility] number at [phone].

## 2023-10-01 ENCOUNTER — Encounter: Payer: Self-pay | Admitting: Surgery

## 2023-10-01 ENCOUNTER — Other Ambulatory Visit: Payer: Self-pay

## 2023-10-04 ENCOUNTER — Other Ambulatory Visit: Payer: Self-pay | Admitting: Surgery

## 2023-10-04 LAB — SURGICAL PATHOLOGY

## 2023-10-13 ENCOUNTER — Ambulatory Visit (INDEPENDENT_AMBULATORY_CARE_PROVIDER_SITE_OTHER): Payer: Medicare HMO | Admitting: Physician Assistant

## 2023-10-13 ENCOUNTER — Encounter: Payer: Self-pay | Admitting: Physician Assistant

## 2023-10-13 VITALS — BP 142/81 | HR 101 | Temp 98.5°F | Ht 65.5 in | Wt 298.4 lb

## 2023-10-13 DIAGNOSIS — Z09 Encounter for follow-up examination after completed treatment for conditions other than malignant neoplasm: Secondary | ICD-10-CM

## 2023-10-13 DIAGNOSIS — D170 Benign lipomatous neoplasm of skin and subcutaneous tissue of head, face and neck: Secondary | ICD-10-CM

## 2023-10-13 NOTE — Progress Notes (Signed)
[facility] POST-OP OFFICE VISIT  10/13/2023  HPI: Debra [name] is a 62 y.o. female 13 days s/p excision of left posterior neck lipoma (4 x 6 cm) with Dr Aleen Campi   She is doing well Only had pain for about 4 days No fever, chills Incision is healing well; itching some  Vital signs: BP (!) 142/81 (BP Location: Right Arm, Patient Position: Sitting, Cuff Size: Large)   Pulse (!) 101   Temp 98.5 F (36.9 C) (Oral)   Ht 5' 5.5" (1.664 m)   Wt 298 lb 6.4 oz (135.4 kg)   SpO2 95%   BMI 48.90 kg/m    Physical Exam: Constitutional: Well appearing female, NAD Skin: 7 cm incision to the left posterior neck, this is healing well, no erythema, mild expected induration, no drainage  Assessment/Plan: This is a 62 y.o. female 13 days s/p excision of left posterior neck lipoma (4 x 6 cm) with Dr Aleen Campi    - Pain control prn  - Reviewed wound care recommendation  - Reviewed surgical pathology; Lipoma  - She can follow up on as needed basis; She understands to call with questions/concerns  -- Lynden Oxford, PA-C [facility] 10/13/2023, 3:54 PM M-F: 7am - 4pm

## 2023-10-13 NOTE — Patient Instructions (Signed)

## 2023-11-08 ENCOUNTER — Telehealth (INDEPENDENT_AMBULATORY_CARE_PROVIDER_SITE_OTHER): Payer: Medicare HMO | Admitting: Psychiatry

## 2023-11-08 ENCOUNTER — Encounter: Payer: Self-pay | Admitting: Psychiatry

## 2023-11-08 DIAGNOSIS — F17210 Nicotine dependence, cigarettes, uncomplicated: Secondary | ICD-10-CM

## 2023-11-08 DIAGNOSIS — F17200 Nicotine dependence, unspecified, uncomplicated: Secondary | ICD-10-CM

## 2023-11-08 DIAGNOSIS — F3178 Bipolar disorder, in full remission, most recent episode mixed: Secondary | ICD-10-CM | POA: Diagnosis not present

## 2023-11-08 DIAGNOSIS — F4310 Post-traumatic stress disorder, unspecified: Secondary | ICD-10-CM

## 2023-11-08 MED ORDER — PAROXETINE HCL 40 MG PO TABS: ORAL_TABLET | ORAL | 3 refills | Status: DC

## 2023-11-08 NOTE — Progress Notes (Unsigned)
Virtual Visit via Video Note  I connected with Debra [name] on 11/08/23 at  1:00 PM EST by a video enabled telemedicine application and verified that I am speaking with the correct person using two identifiers.  Location Provider Location : [facility] Patient Location : Home  Participants: Patient , Provider   I discussed the limitations of evaluation and management by telemedicine and the availability of in person appointments. The patient expressed understanding and agreed to proceed.   I discussed the assessment and treatment plan with the patient. The patient was provided an opportunity to ask questions and all were answered. The patient agreed with the plan and demonstrated an understanding of the instructions.   The patient was advised to call back or seek an in-person evaluation if the symptoms worsen or if the condition fails to improve as anticipated.   BH MD OP Progress Note  11/09/2023 9:48 AM Debra [name]  MRN:  [id number]  Chief Complaint:  Chief Complaint  Patient presents with   Follow-up   Manic Behavior   Anxiety   Depression   Medication Refill   HPI: Debra [name] is a 62 year old Caucasian female, married, disabled, lives in [place], has a history of bipolar disorder, PTSD, tobacco use disorder, Parkinsonian tremors, chronic back pain, hypertension was evaluated by telemedicine today.  Debra [name], diagnosed with bipolar disorder and posttraumatic stress disorder (PTSD), has been managing well on her current medication regimen. She reports no recent mood swings, irritability, mania, or depressive symptoms, indicating good control of her bipolar disorder with lamotrigine.  Regarding her PTSD, she has experienced a few nightmares but was able to return to sleep promptly. She has been taking Paxil for PTSD symptoms, which also aids with anxiety and depression, and reports no side effects.  Debra [name] has been using an over-the-counter melatonin supplement, Natrol, for sleep instead of  prescribed trazodone, which she found to cause grogginess the next day. She reports getting eight hours of sleep and feeling rested upon waking.  She has not needed to use clonazepam for anxiety attacks and denies any significant memory changes or thoughts of self-harm or harm to others. She has discontinued propranolol, with no other changes to her medication regimen.  Debra [name] underwent surgery on the back of her neck, which has resulted in the resolution of pain in her shoulders, knees, and feet. She attributes this improvement to the alleviation of nerve compression in her neck.  Despite occasional smoking when stressed, Debra [name] has demonstrated the ability to abstain from smoking for extended periods, such as during a week-long trip to [place]. She expresses a desire to quit smoking completely.   Visit Diagnosis:    ICD-10-CM   1. Bipolar disorder, in full remission, most recent episode mixed (HCC)  F31.78 PARoxetine (PAXIL) 40 MG tablet    2. PTSD (post-traumatic stress disorder)  F43.10     3. Tobacco use disorder  F17.200       Past Psychiatric History: I have reviewed past psychiatric history from progress note on 02/16/2019.  Past Medical History:  Past Medical History:  Diagnosis Date   Anxiety    Aortic atherosclerosis (HCC)    Arthritis    Bipolar 1 disorder (HCC)    C. difficile colitis    CAD (coronary artery disease)    a.) cCTA 01/10/2021: Ca2+ 171 (95th percentile for age/sex/race match control)   Childhood asthma    DDD (degenerative disc disease), lumbar    a.) s/p LEFT L4-5 foraminotomy/LEFT L5-S1 discectomy 09/2015  Depression    Family history of adverse reaction to anesthesia    a.) intra/post-operative HYPOtension in 1st degree relative (mother)   Head injury, closed, with brief LOC (HCC) 2011ish   Hepatic steatosis    Hyperlipidemia    a.) on PCSK9i (evolumab) for HLD/ASCVD prevention   Hypertension    Infarction of left basal ganglia (HCC)    a.) CT head  10/21/2016: chronic LEFT basal ganglia lacunar infarct.   Insomnia    a.) on trazodone PRN   Lipoma of neck (LEFT)    Long term current use of aspirin    Myalgia due to statin 03/29/2018   Neuropathy of both feet    Obesity    OSA on CPAP    Parkinson's disease (HCC)    Pneumonia    PTSD (post-traumatic stress disorder)    RBD (REM behavioral disorder)    Seasonal allergies    Seizures (HCC)    TIA (transient ischemic attack) 2005   Tobacco abuse    Type 2 diabetes mellitus treated with insulin [facility])     Past Surgical History:  Procedure Laterality Date   ABDOMINAL HYSTERECTOMY  2012   BACK SURGERY     2017   BREAST BIOPSY Right    CESAREAN SECTION  1992   CHOLECYSTECTOMY  2013   LIPOMA EXCISION Left 09/30/2023   Procedure: EXCISION LIPOMA;  Surgeon: Henrene Dodge, MD;  Location: [facility] ORS;  Service: General;  Laterality: Left;  neck   LUMBAR LAMINECTOMY/DECOMPRESSION MICRODISCECTOMY Left 09/10/2015   Procedure: Left L4-5 Foraminotomy/Left L5-S1 Diskectomy;  Surgeon: Hilda Lias, MD;  Location: [facility];  Service: Neurosurgery;  Laterality: Left;  Left L4-5 Foraminotomy/Left L5-S1 Diskectomy    Family Psychiatric History: I have reviewed family psychiatric history from progress note on 02/16/2019.  Family History:  Family History  Problem Relation Age of Onset   COPD Mother    Bipolar disorder Father    Diabetes Mellitus II Father    Bipolar disorder Brother    Diabetes Mellitus II Brother    Bipolar disorder Brother    Breast cancer Neg Hx     Social History: I have reviewed social history from progress note on 02/16/2019. Social History   Socioeconomic History   Marital status: Married    Spouse name: Fayrene Fearing   Number of children: 1   Years of education: Not on file   Highest education level: Associate degree: occupational, Scientist, product/process development, or vocational program  Occupational History   Occupation: Disabled  Tobacco Use   Smoking status: Every Day    Current  packs/day: 0.50    Average packs/day: 0.5 packs/day for 24.1 years (12.1 ttl pk-yrs)    Types: Cigarettes    Start date: 03/19/2004    Passive exposure: Past   Smokeless tobacco: Never  Vaping Use   Vaping status: Never Used  Substance and Sexual Activity   Alcohol use: Not Currently    Comment: occasional- rare   Drug use: No   Sexual activity: Not Currently  Other Topics Concern   Not on file  Social History Narrative   Live in private residence w/ husband and their friend who is wheelchair bound, her end of the house is wheelchair accessible.    Social Determinants of Health   Financial Resource Strain: Low Risk  (06/13/2019)   Overall Financial Resource Strain (CARDIA)    Difficulty of Paying Living Expenses: Not hard at all  Food Insecurity: Food Insecurity Present (06/13/2019)   Hunger Vital Sign  Worried About Programme researcher, broadcasting/film/video in the Last Year: Sometimes true    [employer] of Food in the Last Year: Never true  Transportation Needs: No Transportation Needs (05/12/2018)   PRAPARE - Administrator, Civil Service (Medical): No    Lack of Transportation (Non-Medical): No  Physical Activity: Inactive (05/12/2018)   Exercise Vital Sign    Days of Exercise per Week: 0 days    Minutes of Exercise per Session: 0 min  Stress: Stress Concern Present (06/13/2019)   [employer] of Occupational Health - Occupational Stress Questionnaire    Feeling of Stress : To some extent  Social Connections: Unknown (06/13/2019)   Social Connection and Isolation Panel [NHANES]    Frequency of Communication with Friends and Family: More than three times a week    Frequency of Social Gatherings with Friends and Family: Not on file    Attends Religious Services: Not on file    Active Member of Clubs or Organizations: Not on file    Attends Banker Meetings: Not on file    Marital Status: Not on file    Allergies:  Allergies  Allergen Reactions   Clindamycin/Lincomycin      Pt states caused C-Diff   Montelukast Hives and Rash    Asthma attacks   Primidone     Diffused erythematous and itchy rash   Sulfasalazine Hives    Other reaction(s): Unknown   Metformin And Related Diarrhea   Benadryl [Diphenhydramine Hcl] Swelling    blistering   Canagliflozin Other (See Comments)    Other reaction(s): Other (See Comments) Hypotension    Depakote Er [Divalproex Sodium Er] Other (See Comments)    seizures   Dilaudid [Hydromorphone Hcl] Other (See Comments)    seizures   Diphenhydramine-Zinc Acetate Other (See Comments)   Erythromycin    Ethanol    Haloperidol Other (See Comments)   Neosporin [Neomycin-Bacitracin Zn-Polymyx] Swelling    blistering   Sitagliptin Other (See Comments)   Statins Other (See Comments)    Other reaction(s): myalgias   Sulfa Antibiotics Hives    Other reaction(s): Unknown   Victoza [Liraglutide] Nausea And Vomiting and Other (See Comments)    Other reaction(s): Other (See Comments) pancreatitis     Metabolic Disorder Labs: Lab Results  Component Value Date   HGBA1C 8.9 (H) 06/02/2023   MPG 209 06/02/2023   MPG 192 09/21/2019   No results found for: "PROLACTIN" Lab Results  Component Value Date   CHOL 132 12/13/2020   TRIG 190 (H) 12/13/2020   HDL 52 12/13/2020   CHOLHDL 2.5 12/13/2020   VLDL 38 12/13/2020   LDLCALC 42 12/13/2020   LDLCALC 24 09/21/2019   Lab Results  Component Value Date   TSH 1.671 02/16/2019   TSH 2.160 08/20/2015    Therapeutic Level Labs: No results found for: "LITHIUM" No results found for: "VALPROATE" No results found for: "CBMZ"  Current Medications: Current Outpatient Medications  Medication Sig Dispense Refill   acetaminophen (TYLENOL) 500 MG tablet Take 2 tablets (1,000 mg total) by mouth every 6 (six) hours as needed for mild pain (pain score 1-3).     aspirin EC 81 MG tablet Take 81 mg by mouth daily.     calcium carbonate (OSCAL) 1500 (600 Ca) MG TABS tablet Take 1 tablet by  mouth daily with breakfast.      Continuous Glucose Sensor (DEXCOM G7 SENSOR) MISC USE TO CHECK BLOOD SUGAR DAILY, REPLACE EVERY 10 DAYS  Evolocumab (REPATHA SURECLICK) 140 MG/ML SOAJ Inject 140 mg into the skin every 14 (fourteen) days. 6 mL 3   FARXIGA 10 MG TABS tablet TAKE 1 TABLET DAILY (HIGHER DOSE) 90 tablet 3   furosemide (LASIX) 20 MG tablet Take 20 mg by mouth daily.     glucose blood (ONE TOUCH ULTRA TEST) test strip USE UP TO FOUR TIMES A DAY AS DIRECTED; Dx 11.51, LON 99 months 300 each 3   HUMALOG KWIKPEN 100 UNIT/ML KwikPen INJECT 20 UNITS UNDER THE SKIN IN THE MORNING AND 35 UNITS WITH LUNCH AND EVENING MEAL (INJECT WITH MEALS) (Patient taking differently: INJECT none UNITS UNDER THE SKIN IN THE MORNING AND 20 UNITS WITH LUNCH AND EVENING MEAL (INJECT WITH MEALS)) 90 mL 3   Insulin Pen Needle 29G X 12.7MM MISC 1 each by Does not apply route as needed. 100 each 3   lamoTRIgine (LAMICTAL) 100 MG tablet TAKE ONE TABLET (100 MG TOTAL) BY MOUTH TWO (TWO) TIMES DAILY. 180 tablet 3   mometasone (ELOCON) 0.1 % lotion Apply topically.     NON FORMULARY 10 mg at bedtime. Natrol 10 mg     nystatin cream (MYCOSTATIN) Apply 1 Application topically 2 (two) times daily.     pioglitazone (ACTOS) 30 MG tablet TAKE 1 TABLET DAILY 90 tablet 0   potassium chloride (KLOR-CON) 10 MEQ tablet Take 10 mEq by mouth daily.     topiramate (TOPAMAX) 100 MG tablet Take 100 mg by mouth 2 (two) times daily.     TRESIBA FLEXTOUCH 200 UNIT/ML SOPN Inject 120 Units into the skin at bedtime. (Patient taking differently: Inject 140 Units into the skin every morning. In the morning) 36 pen 5   clonazePAM (KLONOPIN) 0.5 MG tablet Take 1 tablet (0.5 mg total) by mouth as directed. Take one tablet once a day as needed for severe anxiety 21 tablet 0   PARoxetine (PAXIL) 40 MG tablet TAKE ONE TABLET BY MOUTH EVERY MORNING 90 tablet 3   No current facility-administered medications for this visit.      Musculoskeletal: Strength & Muscle Tone:  UTA Gait & Station: normal Patient leans: N/A  Psychiatric Specialty Exam: Review of Systems  Psychiatric/Behavioral: Negative.      There were no vitals taken for this visit.There is no height or weight on file to calculate BMI.  General Appearance: Fairly Groomed  Eye Contact:  Fair  Speech:  Clear and Coherent  Volume:  Normal  Mood:  Euthymic  Affect:  Congruent  Thought Process:  Goal Directed and Descriptions of Associations: Intact  Orientation:  Full (Time, Place, and Person)  Thought Content: Logical   Suicidal Thoughts:  No  Homicidal Thoughts:  No  Memory:  Immediate;   Fair Recent;   Fair Remote;   Fair  Judgement:  Fair  Insight:  Fair  Psychomotor Activity:  Normal  Concentration:  Concentration: Fair and Attention Span: Fair  Recall:  [employer] of Knowledge: Fair  Language: Fair  Akathisia:  No  Handed:  Right  AIMS (if indicated): not done  Assets:  Communication Skills Desire for Improvement Housing Social Support Transportation  ADL's:  Intact  Cognition: WNL  Sleep:  Fair   Screenings: AIMS    Flowsheet Row Video Visit from 03/20/2022 in [facility]  AIMS Total Score 0      GAD-7    Flowsheet Row Office Visit from 03/10/2023 in [facility] Office Visit from 09/08/2022  in [facility] Video Visit from 05/14/2022 in [facility] Video Visit from 05/09/2021 in [facility]  Total GAD-7 Score 19 10 1 8       Mini-Mental    Flowsheet Row Office Visit from 09/21/2016 in [facility]  Total Score (max 30 points ) 27      PHQ2-9    Flowsheet Row Video Visit from 04/13/2023 in [facility] Office Visit from 03/10/2023 in [facility] Video Visit from 12/09/2022 in [facility] Office Visit from 09/08/2022 in [facility] Video Visit from 05/14/2022 in [facility]  PHQ-2 Total Score 0 6 1 3  0  PHQ-9 Total Score -- 20 -- 8 1      Flowsheet Row Video Visit from 11/08/2023 in [facility] Admission (Discharged) from 09/30/2023 in [facility] Pre-Admission Testing 45 from 09/27/2023 in [facility] PRE ADMISSION TESTING  C-SSRS RISK CATEGORY No Risk No Risk No Risk        Assessment and Plan: Debra [name] is a 62 year old Caucasian female, married, disabled, lives in [place], has a history of PTSD, bipolar disorder, diabetes, OSA on CPAP, chronic pain was evaluated by telemedicine today.  Patient is currently overall doing well on the current medication regimen, plan as noted below.  Plan  Bipolar Disorder in remission most recent episode mixed moderate Bipolar disorder is well-managed with lamotrigine. No recent mood swings, irritability, mania, or depression. No side effects from lamotrigine. - Continue lamotrigine 200 mg p.o. daily as prescribed  Posttraumatic Stress Disorder (PTSD)-stable PTSD symptoms are generally well-controlled with Paxil. Occasional nightmares but able to return to sleep. No flashbacks, intrusive memories, or significant sleep problems. No side effects from Paxil. Prefers melatonin over trazodone for sleep due to grogginess. - Continue Paxil 40 mg daily - Discontinue trazodone for side effects and noncompliance - Continue melatonin as needed for sleep  Smoking Cessation-improving Smokes occasionally when stressed but can abstain for extended periods. Interested in quitting and has shown success in not smoking during trips and outings. -  Encourage smoking cessation ,provided counseling for 1 minute - Offer smoking cessation medications if needed  Follow-up - Schedule follow-up appointment for May 5th, 2025 at 9 AM in the office.    Consent: Patient/Guardian gives verbal consent for treatment and assignment of benefits for services provided during this visit. Patient/Guardian expressed understanding and agreed to proceed.   This note was generated in part or whole with voice recognition software. Voice recognition is usually quite accurate but there are transcription errors that can and very often do occur. I apologize for any typographical errors that were not detected and corrected.    Jomarie Longs, MD 11/09/2023, 9:48 AM

## 2023-12-14 NOTE — Progress Notes (Signed)
 Date:  12/15/2023   ID:  Debra [name], DOB 07/02/1961, MRN [id number]  Patient Location:  [address] [name] [address]   Provider location:   [facility], [place] office  PCP:  [name], [name], MD  Cardiologist:  [name]    Chief Complaint  Patient presents with   12 month follow up     Patient c/o fluttering in chest when lying flat but with the bed on an incline, she doesn't feel the fluttering. Medications reviewed by the patient verbally.     History of Present Illness:    Debra [name] is a 63 y.o. female  past medical history of Smoker  Aortic athero on CT Poorly controlled diabetes Morbid obesity Hypertension Parkinson's with tremor Calcium  score 171 Who presents for follow-up of her uncontrolled diabetes, atherosclerosis, chest heaviness  Last seen in clinic by myself April 2023 Follow-up today she reports that she feels well Denies any pain from arthritis No regular exercise program  Still smoking, <1/2 ppd On CPAP  Lab work reviewed A1c 7.5 Cholesterol in 2023, total cholesterol 137 LDL 63 on PCSK9 inhibitor  Reports that she is off propranolol  for tremor She is on Topamax but does not know why Denies any active tremor  She does have occasional palpitations when she lays down supine in bed  EKG personally reviewed by myself on todays visit EKG Interpretation Date/Time:  Wednesday December 15 2023 14:11:50 EST Ventricular Rate:  68 PR Interval:  152 QRS Duration:  100 QT Interval:  420 QTC Calculation: 446 R Axis:   -19  Text Interpretation: Normal sinus rhythm Normal ECG When compared with ECG of 02-Jun-2023 14:37, No significant change was found Confirmed by Perla Lye [phone]) on 12/15/2023 2:15:08 PM    Other past medical history reviewed Cardiac CT ordered on January 2022 visit Calcium  score 171 Image quality limited secondary to morbid obesity Nonobstructive disease noted Fatty liver  noted   CT scan from March 2019 At least mild, some case mild to moderate calcification noted in the abdominal aorta and branches/iliac vessels   Past Medical History:  Diagnosis Date   Anxiety    Aortic atherosclerosis (HCC)    Arthritis    Bipolar 1 disorder (HCC)    C. difficile colitis    CAD (coronary artery disease)    a.) cCTA 01/10/2021: Ca2+ 171 (95th percentile for age/sex/race match control)   Childhood asthma    DDD (degenerative disc disease), lumbar    a.) s/p LEFT L4-5 foraminotomy/LEFT L5-S1 discectomy 09/2015   Depression    Family history of adverse reaction to anesthesia    a.) intra/post-operative HYPOtension in 1st degree relative (mother)   Head injury, closed, with brief LOC (HCC) 2011ish   Hepatic steatosis    Hyperlipidemia    a.) on PCSK9i (evolumab) for HLD/ASCVD prevention   Hypertension    Infarction of left basal ganglia (HCC)    a.) CT head 10/21/2016: chronic LEFT basal ganglia lacunar infarct.   Insomnia    a.) on trazodone  PRN   Lipoma of neck (LEFT)    Long term current use of aspirin     Myalgia due to statin 03/29/2018   Neuropathy of both feet    Obesity    OSA on CPAP    Parkinson's disease (HCC)    Pneumonia    PTSD (post-traumatic stress disorder)    RBD (REM behavioral disorder)    Seasonal allergies  Seizures (HCC)    TIA (transient ischemic attack) 2005   Tobacco abuse    Type 2 diabetes mellitus treated with insulin  [facility])    Past Surgical History:  Procedure Laterality Date   ABDOMINAL HYSTERECTOMY  2012   BACK SURGERY     2017   BREAST BIOPSY Right    CESAREAN SECTION  1992   CHOLECYSTECTOMY  2013   LIPOMA EXCISION Left 09/30/2023   Procedure: EXCISION LIPOMA;  Surgeon: Desiderio Schanz, MD;  Location: [facility] ORS;  Service: General;  Laterality: Left;  neck   LUMBAR LAMINECTOMY/DECOMPRESSION MICRODISCECTOMY Left 09/10/2015   Procedure: Left L4-5 Foraminotomy/Left L5-S1 Diskectomy;  Surgeon: Catalina Stains, MD;   Location: [facility];  Service: Neurosurgery;  Laterality: Left;  Left L4-5 Foraminotomy/Left L5-S1 Diskectomy     Current Meds  Medication Sig   acetaminophen  (TYLENOL ) 500 MG tablet Take 2 tablets (1,000 mg total) by mouth every 6 (six) hours as needed for mild pain (pain score 1-3).   aspirin  EC 81 MG tablet Take 81 mg by mouth daily.   calcium  carbonate (OSCAL) 1500 (600 Ca) MG TABS tablet Take 1 tablet by mouth daily with breakfast.    clonazePAM  (KLONOPIN ) 0.5 MG tablet Take 1 tablet (0.5 mg total) by mouth as directed. Take one tablet once a day as needed for severe anxiety   Continuous Glucose Sensor (DEXCOM G7 SENSOR) MISC USE TO CHECK BLOOD SUGAR DAILY, REPLACE EVERY 10 DAYS   Evolocumab  (REPATHA  SURECLICK) 140 MG/ML SOAJ Inject 140 mg into the skin every 14 (fourteen) days.   FARXIGA  10 MG TABS tablet TAKE 1 TABLET DAILY (HIGHER DOSE)   furosemide  (LASIX ) 20 MG tablet Take 20 mg by mouth daily.   glucose blood (ONE TOUCH ULTRA TEST) test strip USE UP TO FOUR TIMES A DAY AS DIRECTED; Dx 11.51, LON 99 months   HUMALOG  KWIKPEN 100 UNIT/ML KwikPen INJECT 20 UNITS UNDER THE SKIN IN THE MORNING AND 35 UNITS WITH LUNCH AND EVENING MEAL (INJECT WITH MEALS) (Patient taking differently: INJECT none UNITS UNDER THE SKIN IN THE MORNING AND 20 UNITS WITH LUNCH AND EVENING MEAL (INJECT WITH MEALS))   Insulin  Pen Needle 29G X 12.7MM MISC 1 each by Does not apply route as needed.   lamoTRIgine  (LAMICTAL ) 100 MG tablet TAKE ONE TABLET (100 MG TOTAL) BY MOUTH TWO (TWO) TIMES DAILY.   lisinopril  (ZESTRIL ) 20 MG tablet Take 20 mg by mouth daily.   mometasone (ELOCON) 0.1 % lotion Apply topically.   NON FORMULARY 10 mg at bedtime. Natrol 10 mg   nystatin  cream (MYCOSTATIN ) Apply 1 Application topically 2 (two) times daily.   PARoxetine  (PAXIL ) 40 MG tablet TAKE ONE TABLET BY MOUTH EVERY MORNING   pioglitazone  (ACTOS ) 30 MG tablet TAKE 1 TABLET DAILY   topiramate (TOPAMAX) 100 MG tablet Take 100 mg by  mouth 2 (two) times daily.   TRESIBA  FLEXTOUCH 200 UNIT/ML SOPN Inject 120 Units into the skin at bedtime. (Patient taking differently: Inject 140 Units into the skin every morning. In the morning)   [DISCONTINUED] potassium chloride  (KLOR-CON ) 10 MEQ tablet Take 10 mEq by mouth daily.     Allergies:   Clindamycin /lincomycin, Montelukast, Primidone, Sulfasalazine, Metformin and related, Benadryl [diphenhydramine hcl], Canagliflozin , Depakote er [divalproex sodium er], Dilaudid [hydromorphone hcl], Diphenhydramine-zinc acetate, Erythromycin, Ethanol, Haloperidol, Neosporin [neomycin-bacitracin zn-polymyx], Sitagliptin, Statins, Sulfa antibiotics, and Victoza [liraglutide]   Social History   Tobacco Use   Smoking status: Some Days    Current packs/day: 0.50  Average packs/day: 0.5 packs/day for 24.2 years (12.1 ttl pk-yrs)    Types: Cigarettes    Start date: 03/19/2004    Passive exposure: Past   Smokeless tobacco: Never  Vaping Use   Vaping status: Never Used  Substance Use Topics   Alcohol use: Not Currently    Comment: occasional- rare   Drug use: No     Current Outpatient Medications on File Prior to Visit  Medication Sig Dispense Refill   acetaminophen  (TYLENOL ) 500 MG tablet Take 2 tablets (1,000 mg total) by mouth every 6 (six) hours as needed for mild pain (pain score 1-3).     aspirin  EC 81 MG tablet Take 81 mg by mouth daily.     calcium  carbonate (OSCAL) 1500 (600 Ca) MG TABS tablet Take 1 tablet by mouth daily with breakfast.      clonazePAM  (KLONOPIN ) 0.5 MG tablet Take 1 tablet (0.5 mg total) by mouth as directed. Take one tablet once a day as needed for severe anxiety 21 tablet 0   Continuous Glucose Sensor (DEXCOM G7 SENSOR) MISC USE TO CHECK BLOOD SUGAR DAILY, REPLACE EVERY 10 DAYS     Evolocumab  (REPATHA  SURECLICK) 140 MG/ML SOAJ Inject 140 mg into the skin every 14 (fourteen) days. 6 mL 3   FARXIGA  10 MG TABS tablet TAKE 1 TABLET DAILY (HIGHER DOSE) 90 tablet 3    furosemide  (LASIX ) 20 MG tablet Take 20 mg by mouth daily.     glucose blood (ONE TOUCH ULTRA TEST) test strip USE UP TO FOUR TIMES A DAY AS DIRECTED; Dx 11.51, LON 99 months 300 each 3   HUMALOG  KWIKPEN 100 UNIT/ML KwikPen INJECT 20 UNITS UNDER THE SKIN IN THE MORNING AND 35 UNITS WITH LUNCH AND EVENING MEAL (INJECT WITH MEALS) (Patient taking differently: INJECT none UNITS UNDER THE SKIN IN THE MORNING AND 20 UNITS WITH LUNCH AND EVENING MEAL (INJECT WITH MEALS)) 90 mL 3   Insulin  Pen Needle 29G X 12.7MM MISC 1 each by Does not apply route as needed. 100 each 3   lamoTRIgine  (LAMICTAL ) 100 MG tablet TAKE ONE TABLET (100 MG TOTAL) BY MOUTH TWO (TWO) TIMES DAILY. 180 tablet 3   lisinopril  (ZESTRIL ) 20 MG tablet Take 20 mg by mouth daily.     mometasone (ELOCON) 0.1 % lotion Apply topically.     NON FORMULARY 10 mg at bedtime. Natrol 10 mg     nystatin  cream (MYCOSTATIN ) Apply 1 Application topically 2 (two) times daily.     PARoxetine  (PAXIL ) 40 MG tablet TAKE ONE TABLET BY MOUTH EVERY MORNING 90 tablet 3   pioglitazone  (ACTOS ) 30 MG tablet TAKE 1 TABLET DAILY 90 tablet 0   topiramate (TOPAMAX) 100 MG tablet Take 100 mg by mouth 2 (two) times daily.     TRESIBA  FLEXTOUCH 200 UNIT/ML SOPN Inject 120 Units into the skin at bedtime. (Patient taking differently: Inject 140 Units into the skin every morning. In the morning) 36 pen 5   No current facility-administered medications on file prior to visit.     Family Hx: The patient's family history includes Bipolar disorder in her brother, brother, and father; COPD in her mother; Diabetes Mellitus II in her brother and father. There is no history of Breast cancer.  ROS:   Please see the history of present illness.    Review of Systems  Constitutional: Negative.   Respiratory: Negative.    Cardiovascular:  Positive for palpitations.  Gastrointestinal: Negative.   Musculoskeletal: Negative.   Neurological: Negative.  Psychiatric/Behavioral:  Negative.    All other systems reviewed and are negative.    Labs/Other Tests and Data Reviewed:    Recent Labs: 09/28/2023: BUN 14; Creatinine, Ser 0.70; Hemoglobin 15.3; Platelets 250; Potassium 3.6; Sodium 140   Recent Lipid Panel Lab Results  Component Value Date/Time   CHOL 132 12/13/2020 05:16 PM   CHOL 228 (H) 06/14/2015 08:18 AM   TRIG 190 (H) 12/13/2020 05:16 PM   HDL 52 12/13/2020 05:16 PM   HDL 44 06/14/2015 08:18 AM   CHOLHDL 2.5 12/13/2020 05:16 PM   LDLCALC 42 12/13/2020 05:16 PM   LDLCALC 24 09/21/2019 12:00 AM    Wt Readings from Last 3 Encounters:  12/15/23 296 lb (134.3 kg)  10/13/23 298 lb 6.4 oz (135.4 kg)  09/27/23 (!) 305 lb (138.3 kg)     Exam:    Vital Signs: Vital signs may also be detailed in the HPI BP 130/80 (BP Location: Left Wrist, Patient Position: Sitting, Cuff Size: Normal)   Pulse 68   Ht 5' 5.25 (1.657 m)   Wt 296 lb (134.3 kg)   SpO2 98%   BMI 48.88 kg/m   Constitutional:  oriented to person, place, and time. No distress.  HENT:  Head: Grossly normal Eyes:  no discharge. No scleral icterus.  Neck: No JVD, no carotid bruits  Cardiovascular: Regular rate and rhythm, no murmurs appreciated Pulmonary/Chest: Clear to auscultation bilaterally, no wheezes or rails Abdominal: Soft.  no distension.  no tenderness.  Musculoskeletal: Normal range of motion Neurological:  normal muscle tone. Coordination normal. No atrophy Skin: Skin warm and dry Psychiatric: normal affect, pleasant   ASSESSMENT & PLAN:    Type 2 diabetes with complications No improvement in A1c 9 down to 8 down to 7.5 Low carbohydrate diet discussed in detail  Abdominal aortic atherosclerosis (HCC) On CT scan, mild, possibly mild to moderate aortic atherosclerosis and distal aorta and iliac vessels Cholesterol at goal Smoking cessation recommended Recommend further calorie restriction for diabetes management as above  Coronary calcification Calcium  score  177 Cholesterol at goal on PCSK9 inhibitor Aggressive diabetes control recommended  Mixed hyperlipidemia Continue PCSK9 inhibitor, numbers at goal Repeat lipid panel ordered today  Essential hypertension Blood pressure is well controlled on today's visit. No changes made to the medications.  Tremor Reports that she is off propranolol  She is on psychiatric medications for bipolar managed by psychiatry  we have recommended she ask them about her Topamax (she does not know why she is on it) She does report no significant tremor  Chest pain history Denies recent chest pain symptoms, no further ischemic workup needed  Hypokalemia We have recommended she increase potassium supplement up to 20 mill equivalents daily She is taking Lasix  daily Stable BMP  Signed, Evalene Lunger, MD  12/15/2023 2:48 PM    [facility] [address], [place], [name] [address]

## 2023-12-15 ENCOUNTER — Ambulatory Visit: Payer: Medicare HMO | Attending: Cardiovascular Disease | Admitting: Cardiovascular Disease

## 2023-12-15 ENCOUNTER — Encounter: Payer: Self-pay | Admitting: Cardiovascular Disease

## 2023-12-15 VITALS — BP 130/80 | HR 68 | Ht 65.25 in | Wt 296.0 lb

## 2023-12-15 DIAGNOSIS — I10 Essential (primary) hypertension: Secondary | ICD-10-CM

## 2023-12-15 DIAGNOSIS — E785 Hyperlipidemia, unspecified: Secondary | ICD-10-CM

## 2023-12-15 DIAGNOSIS — Z79899 Other long term (current) drug therapy: Secondary | ICD-10-CM

## 2023-12-15 DIAGNOSIS — I700 Atherosclerosis of aorta: Secondary | ICD-10-CM | POA: Diagnosis not present

## 2023-12-15 MED ORDER — POTASSIUM CHLORIDE ER 20 MEQ PO TBCR: 20.0000 meq | EXTENDED_RELEASE_TABLET | Freq: Every day | ORAL | 3 refills | Status: DC

## 2023-12-15 NOTE — Patient Instructions (Addendum)
 Medication Instructions:  Please increase the potassium up to 20 meq daily  If you need a refill on your cardiac medications before your next appointment, please call your pharmacy.   Lab work: No new labs needed  Testing/Procedures: Lipids today  Follow-Up: At [facility], you and your health needs are our priority.  As part of our continuing mission to provide you with exceptional heart care, we have created designated Provider Care Teams.  These Care Teams include your primary Cardiologist (physician) and Advanced Practice Providers (APPs -  Physician Assistants and Nurse Practitioners) who all work together to provide you with the care you need, when you need it.  You will need a follow up appointment in 12 months  Providers on your designated Care Team:   Lonni Meager, NP Bernardino Bring, PA-C Cadence [place], [place]  COVID-19 Vaccine Information can be found at: [email or web address] For questions related to vaccine distribution or appointments, please email [email or web address] or call [phone].

## 2023-12-16 LAB — LIPID PANEL
Chol/HDL Ratio: 2.7 {ratio} (ref 0.0–4.4)
Cholesterol, Total: 143 mg/dL (ref 100–199)
HDL: 53 mg/dL (ref >39)
LDL Chol Calc (NIH): 70 mg/dL (ref 0–99)
Triglycerides: 113 mg/dL (ref 0–149)
VLDL Cholesterol Cal: 20 mg/dL (ref 5–40)

## 2023-12-20 ENCOUNTER — Encounter: Payer: Self-pay | Admitting: Emergency Medicine

## 2023-12-27 ENCOUNTER — Ambulatory Visit: Payer: Medicare HMO | Admitting: Cardiovascular Disease

## 2024-01-20 ENCOUNTER — Encounter: Payer: Self-pay | Admitting: Cardiovascular Disease

## 2024-01-28 ENCOUNTER — Telehealth: Payer: Self-pay | Admitting: Pharmacy Technician

## 2024-01-28 ENCOUNTER — Telehealth: Payer: Self-pay | Admitting: Cardiovascular Disease

## 2024-01-28 ENCOUNTER — Other Ambulatory Visit (HOSPITAL_COMMUNITY): Payer: Self-pay

## 2024-01-28 NOTE — Telephone Encounter (Signed)
 Pt c/o medication issue:  1. Name of Medication: Evolocumab (REPATHA SURECLICK) 140 MG/ML SOAJ   2. How are you currently taking this medication (dosage and times per day)?   3. Are you having a reaction (difficulty breathing--STAT)?   4. What is your medication issue? Patient is needing PA for this med sent to insurance.

## 2024-01-28 NOTE — Telephone Encounter (Signed)
 Pharmacy Patient Advocate Encounter   Received notification from Pt Calls Messages that prior authorization for REPATHA is required/requested.   Insurance verification completed.   The patient is insured through [employer] .   Per test claim: PA required; PA submitted to above mentioned insurance via CoverMyMeds Key/confirmation #/EOC [id number] Status is pending

## 2024-01-28 NOTE — Telephone Encounter (Signed)
 Pharmacy Patient Advocate Encounter  Received notification from [facility] that Prior Authorization for repatha has been APPROVED from 01/28/24 to 12/06/24. Unable to obtain price due to refill too soon rejection, last fill date 01/14/24 next available fill date2/28/25   PA #/Case ID/Reference #: [id number]

## 2024-04-10 ENCOUNTER — Ambulatory Visit: Payer: Self-pay | Admitting: Psychiatry

## 2024-05-17 ENCOUNTER — Other Ambulatory Visit: Payer: Self-pay | Admitting: Cardiovascular Disease

## 2024-05-17 DIAGNOSIS — E785 Hyperlipidemia, unspecified: Secondary | ICD-10-CM

## 2024-05-17 DIAGNOSIS — I700 Atherosclerosis of aorta: Secondary | ICD-10-CM

## 2024-05-29 ENCOUNTER — Telehealth: Payer: Self-pay | Admitting: Cardiovascular Disease

## 2024-05-29 ENCOUNTER — Telehealth: Payer: Self-pay | Admitting: Pharmacy Technician

## 2024-05-29 ENCOUNTER — Other Ambulatory Visit (HOSPITAL_COMMUNITY): Payer: Self-pay

## 2024-05-29 NOTE — Telephone Encounter (Signed)
 Pharmacy Patient Advocate Encounter   Received notification from Pt Calls Messages that prior authorization for Repatha  is required/requested.   Insurance verification completed.   The patient is insured through [employer] .   Per test claim: PA required; PA submitted to above mentioned insurance via CoverMyMeds Key/confirmation #/EOC [id number] Status is pending

## 2024-05-29 NOTE — Telephone Encounter (Signed)
*  STAT* If patient is at the pharmacy, call can be transferred to refill team.   1. Which medications need to be refilled? (please list name of each medication and dose if known) needs a prior authorization for Repatha    2. Would you like to learn more about the convenience, safety, & potential cost savings by using the [facility]?      3. Are you open to using the [facility] (Type [facility].   4. Which pharmacy/location (including street and city if local pharmacy) is medication to be sent to?[facility] [place]   5. Do they need a 30 day or 90 day supply?

## 2024-05-29 NOTE — Telephone Encounter (Signed)
 Pharmacy Patient Advocate Encounter  Received notification from [facility] that Prior Authorization for Repatha  has been APPROVED from 05/29/24 to 12/06/24. Unable to obtain price due to refill too soon rejection, last fill date 05/18/24 next available fill date07/03/25   PA #/Case ID/Reference #: [id number]

## 2024-07-01 LAB — COLOGUARD: COLOGUARD: NEGATIVE

## 2024-07-24 ENCOUNTER — Other Ambulatory Visit: Payer: Self-pay | Admitting: Family Medicine

## 2024-07-24 DIAGNOSIS — Z1231 Encounter for screening mammogram for malignant neoplasm of breast: Secondary | ICD-10-CM

## 2024-08-10 ENCOUNTER — Ambulatory Visit
Admission: RE | Admit: 2024-08-10 | Discharge: 2024-08-10 | Disposition: A | Discharge status: Home / Self Care | Source: Ambulatory Visit | Attending: Family Medicine | Admitting: Family Medicine

## 2024-08-10 DIAGNOSIS — Z1231 Encounter for screening mammogram for malignant neoplasm of breast: Secondary | ICD-10-CM | POA: Insufficient documentation

## 2024-09-05 DIAGNOSIS — G4733 Obstructive sleep apnea (adult) (pediatric): Secondary | ICD-10-CM | POA: Diagnosis not present

## 2024-09-07 ENCOUNTER — Other Ambulatory Visit: Payer: Self-pay | Admitting: Psychiatry

## 2024-09-07 DIAGNOSIS — F4310 Post-traumatic stress disorder, unspecified: Secondary | ICD-10-CM

## 2024-09-08 ENCOUNTER — Telehealth: Payer: Self-pay | Admitting: Psychiatry

## 2024-09-08 NOTE — Telephone Encounter (Signed)
 Patient has not been seen for almost a year.  Patient will need an in office visit for further refills of her medications.  I have sent a short supply of Lamictal  to pharmacy today.  No future refills without an appointment.

## 2024-09-14 DIAGNOSIS — H6123 Impacted cerumen, bilateral: Secondary | ICD-10-CM | POA: Diagnosis not present

## 2024-09-14 DIAGNOSIS — H6981 Other specified disorders of Eustachian tube, right ear: Secondary | ICD-10-CM | POA: Diagnosis not present

## 2024-09-14 DIAGNOSIS — J300 Vasomotor rhinitis: Secondary | ICD-10-CM | POA: Diagnosis not present

## 2024-10-04 ENCOUNTER — Other Ambulatory Visit: Payer: Self-pay | Admitting: Psychiatry

## 2024-10-04 ENCOUNTER — Telehealth: Payer: Self-pay | Admitting: Psychiatry

## 2024-10-04 DIAGNOSIS — F4310 Post-traumatic stress disorder, unspecified: Secondary | ICD-10-CM

## 2024-10-04 DIAGNOSIS — F3178 Bipolar disorder, in full remission, most recent episode mixed: Secondary | ICD-10-CM

## 2024-10-04 NOTE — Telephone Encounter (Signed)
 Message left for patient to call by 10-06-24 if wants to continue care at this office.

## 2024-10-12 ENCOUNTER — Encounter: Payer: Self-pay | Admitting: Psychiatry

## 2024-10-12 MED ORDER — PAROXETINE HCL 40 MG PO TABS: ORAL_TABLET | ORAL | 0 refills | Status: AC

## 2024-10-12 NOTE — Telephone Encounter (Signed)
 I have sent a short supply of Paxil  to pharmacy.  Lamictal  was already approved for 30 days.  I have printed out a letter with resources for this patient.  Will have staff mail it to patient.

## 2024-10-25 ENCOUNTER — Telehealth: Payer: Self-pay | Admitting: Psychiatry

## 2024-10-25 NOTE — Telephone Encounter (Signed)
 How do we address this now ?

## 2024-10-25 NOTE — Telephone Encounter (Signed)
 Patient was mailed a certified letter of dismissal. It was returned to us  and signed by her husband Idonna Heeren.

## 2024-10-25 NOTE — Telephone Encounter (Signed)
 Noted , so are you able to send that ?

## 2024-10-30 ENCOUNTER — Encounter: Payer: Self-pay | Admitting: Psychiatry

## 2024-10-30 ENCOUNTER — Other Ambulatory Visit: Payer: Self-pay | Admitting: Cardiovascular Disease

## 2024-10-30 DIAGNOSIS — E785 Hyperlipidemia, unspecified: Secondary | ICD-10-CM

## 2024-10-30 DIAGNOSIS — I700 Atherosclerosis of aorta: Secondary | ICD-10-CM

## 2024-10-30 NOTE — Telephone Encounter (Signed)
 I have sent letter with resources through MyChart since patient did not accept the certified letter that was mailed out to her previously.

## 2024-11-16 NOTE — Progress Notes (Addendum)
 HPI: Debra [name] is a 63 y.o. female who presents for follow up diabetes.  I also see the co-owner of her house Versa Malkin [phone]).  She was originally diagnosed around 2003.  She started with diet initially and then had pills added.  Several years ago insulin  was added.  I last saw her in 8/25 in person. She has done well since then. Today's visit is conducted via video call.   She currently takes 112 units of Tresiba  qam.  She takes 10-15 units of Humalog  with meals, Jardiance  25 mg daily and Actos  30 mg daily. She went off Farxiga  in 5/25 as insurance preferred Jadiance over Farxiga . .  She has had side effects/intolerances to multiple agents including Victoza (pancreatitis), Tradjenta /Januvia (ineffective), metformin (GI issues) and Invokana  (syncope).   She uses the Dexcom for glucose monitoring.  Her sensor was downloaded and reviewed.  The average was 161 with a standard deviation of 48.  Her TIR is 70% she is 1% below range.  Review of her diet shows that she tries to avoid concentrated carbohydrates.  She does not exercise regulary.  Her weight was not taken today as it is a video visit.  She was previously on lyrica  for chronic foot pain, but has been off it for a while now.  She was also previoulsy on gabapentin , but discontinued it due to cramps. Her foot pain is better overall.  She is concerned about her diabetes.   ROS:  No chest pain.  No shortness of breath.   Medical History: Past Medical History:  Diagnosis Date   Arthritis    Depression    Diabetes mellitus type 2, uncomplicated (CMS/HHS-HCC)    Hot flashes    Hot flashes    Hyperlipidemia    Hypertension    Osteoporosis    Sleep apnea     Surgical History: Past Surgical History:  Procedure Laterality Date   HYSTERECTOMY  2012   CHOLECYSTECTOMY  2013   back surgery  09/10/2015   CESAREAN DELIVERY     CESAREAN SECTION     OOPHORECTOMY     SPINE SURGERY      Social History:  reports that she  has been smoking cigarettes. She has a 8 pack-year smoking history. She has never used smokeless tobacco. She reports that she does not drink alcohol and does not use drugs.  She is married.  She does not work.  Family History: family history includes Alzheimer's disease in her paternal grandmother; Anxiety in her mother; Bipolar disorder in her father; COPD in her mother; Cancer in her maternal grandfather and paternal grandfather; Colon cancer in her maternal grandmother; Colon polyps in her mother; Deep vein thrombosis (DVT or abnormal blood clot formation) in her mother; Depression in her father; Diabetes in her father and paternal grandmother; Diabetes type II in her father; High blood pressure (Hypertension) in her mother; Mental retardation in her sister; Migraines in her sister; Obesity in her maternal grandmother; Osteoporosis (Thinning of bones) in her mother; Thyroid  disease in her maternal grandmother and mother.  Medications: Current Outpatient Medications  Medication Sig Dispense Refill   aspirin  81 MG chewable tablet Take 81 mg by mouth once daily.     blood-glucose sensor (DEXCOM G7 SENSOR) Devi USE TO CHECK BLOOD SUGAR DAILY, REPLACE EVERY 10 DAYS 3 each 11   cholecalciferol  1000 unit tablet Take 1 tablet (1,000 Units total) by mouth once daily 90 tablet 3   empagliflozin  (JARDIANCE ) 25 mg tablet Take 1 tablet (  25 mg total) by mouth once daily 90 tablet 3   evolocumab  (REPATHA  SURECLICK) 140 mg/mL PnIj Inject subcutaneously Every 14 days     fluconazole  (DIFLUCAN ) 150 MG tablet Take one tablet now.  Repeat in 72 hours if symptoms persist 2 tablet 0   FUROsemide  (LASIX ) 20 MG tablet TAKE 1 TABLET (20 MG TOTAL) BY MOUTH ONCE DAILY 100 tablet 1   insulin  DEGLUDEC (TRESIBA  FLEXTOUCH U-200) pen injector (concentration 200 units/mL) Inject 140 Units subcutaneously once daily (Patient taking differently: Inject 125 Units subcutaneously once daily)     insulin  LISPRO (ADMELOG ,  HUMALOG ) injection (concentration 100 units/mL) Inject subcutaneously 3 (three) times daily with meals Sliding scale with meals     lamoTRIgine  (LAMICTAL ) 100 MG tablet Take 2 tablets by mouth once daily     lisinopriL  (ZESTRIL ) 20 MG tablet TAKE 1 TABLET (20 MG TOTAL) BY MOUTH ONCE DAILY 100 tablet 1   PARoxetine  (PAXIL ) 40 MG tablet Take 40 mg by mouth once daily        pen needle, diabetic (NOVOFINE 32) 32 gauge x 1/4 needle 4-5 shots per day 500 each 3   pen needle, diabetic 29 gauge x 1/2 needle BD Ultra-Fine Original Pen Needle 29 gauge x 1/2  USE AS DIRECTED 3 TIMES A DAY     pioglitazone  (ACTOS ) 30 MG tablet TAKE 1 TABLET BY MOUTH ONCE A DAY 90 tablet 3   potassium chloride  (KLOR-CON ) 10 MEQ ER tablet Take 2 tablets by mouth once daily     blood glucose diagnostic test strip Use 1 each (1 strip total) 3 (three) times daily Use as instructed. 100 each 12   No current facility-administered medications for this visit.    Allergies: Allergies  Allergen Reactions   Clindamycin  Other (See Comments)    Pt states caused C-Diff   Primidone Anaphylaxis   Benadryl [Diphenhydramine-Zinc Acetate] Unknown   Cholesterol Other (See Comments)    meds cause her to pass out     Dilaudid [Hydromorphone (Bulk)] Unknown   Diphenhydramine Hcl Swelling    blistering   Divalproex Unknown and Other (See Comments)    seizures     Haloperidol Other (See Comments)   Invokana  [Canagliflozin ] Other (See Comments)    Other reaction(s): Other (See Comments) Low bp Low bp   Januvia [Sitagliptin] Abdominal Pain   Metformin Diarrhea   Montelukast Unknown   Montelukast Sodium Other (See Comments)    Asthma attacks     Neomycin-Bacitracnzn-Polymyxnb Swelling    blistering   Statins-Hmg-Coa Reductase Inhibitors Unknown   Sulfa (Sulfonamide Antibiotics) Unknown   Sulfasalazine Hives   Victoza [Liraglutide] Nausea And Vomiting, Nausea and Unknown    pancreatitis     Physical Exam: Vitals:   11/16/24 1330  Weight: (!) 129.7 kg (286 lb)    Body mass index is 47.59 kg/m. Gen:  WDWN in NAD   Physical exam otherwise deferred due to coronavirus precautions.  Labs: 11/18/2018: A1c = 8.1.  Cholesterol = 218/139/45/147. 02/16/2019: TSH = 1.671 06/16/2019: A1c = 9.1.  K/Cr/Ca = 4.5/0.92/9.6.  Cholesterol = 120/107/52/49.  LFTs normal. 09/21/2019: A1c = 8.3.  K/Cr/Ca = 3.8/0.86/9.8.  Chol. = 98/167/49/24.  LFTs nl.    02/21/2020: A1c = 8.6.  Glucose = 157 (176 by [facility]).  Fructosamine =278 (c/w a1c~6.4).  K/Cr/Ca = 4.0/0.9/9.3.  06/25/2020: A1c = 7.8 10/12/2020: K/Cr/Ca = 4.3/0.93/9.4., glucose = 334.   11/13/2020: A1c = 8.7.  11/22/2020:  A1c=8.7.  K/Cr/Ca= 4.1/0.87/9.2.  Chol=117/95/55/44.  LFTs nl.  TSH=1.84.  02/20/2021: A1c = 8.5.  K/Cr/Ca = 4.7/0.98/9.6.  MA < 3.  Chol. = 137/140/49/64.  LFTs nl.  TSH = 2.212.  D = 35.1.  06/25/2021:  A1c = 7.8 08/29/2021:  A1c=8.3. K/Cr/Ca= 4.3/0.85/9.8 MA<3.  Chol=113/102/52/42.  LFTs nl.  1-25 D=38.9 (24.8-81.5). 09/30/2021:  K/Cr/Ca= 3.9/0.89/9.5.  UDY=7.35, FT4=1.36  05/14/2022:  A1c = 8.6 09/23/2022:  A1c = 8.4.  Fructosamine = 316 (c/w A1c~7.0).  Glucose =140.  K/Cr/Ca =4.5/0.9/9.5.  Microalbumin <7.  Cholesterol =137/98/54.9/63.  LFTs normal.  TSH=2.945.  Vitamin D  =25.2.  CBC normal. 01/28/2023: A1c =7.9. 06/02/2023: A1c = 8.9 09/06/2023:  A1c=7.5.  11/23/2023:  D=28  12/15/2023:  Chol=143/113/53/70 02/23/2024:  A1c=8.0.  03/08/2024: K/Cr/Ca = 3.9/9.3/1.0.  LFTs nl.  07/18/2024: A1c = 8.3.   Assessment/Plan: 1.  Diabetes.  Her diabetes has historically been poorly controlled. Her A1c was not done prior to today's visit as it is conducted via video call.  Her A1c in 8/25 is 8.3 though her GMI suggests that her A1c should be 7.2.  Based on her CGM, I will make no changes to her medical regimen. I encouraged lifestyle modifications.   2.  Hypertension associated with diabetes.  Her blood pressure was good last  visit on Lisinopril  20 mg daily and  Metoprolol .  Metoprolol  was previously for essential tremor.      3.  Hyperlipidemia associated with diabetes.  She did not tolerate statin therapy, so was put on Repatha  140 mg every 14 days by cardiology.  Her LDL in 1/25 was 70 on therapy.  4.  Neuropathy.  She has shooting pains in her feet and some burning.  She also has an intermittently abnormal filament by exam in 8/24.  Her gabapentin  was stopped due to leg cramps.  She was previously on lyrica , but has been off for a while now.  I filled out forms for diabetic shoes in 1/22.  She knows to be careful with her feet.  She last saw podiatry in 11/24.   5.  Tobacco abuse.  She had been off cigarettes in the past but started back up.  She says she has been trying to cut back some.  She has a half a pack a day.  I encouraged her to quit.  Her husband smokes which makes it more difficult (though he is trying to quit himself).  6.  Vitamin D  deficiency.  Her D has been low (most recently in 12/24).  She previously started on D supplementation but says she had diarrhea with it so stopped.  Her Ca supplement has D in it so she instead doubled her Ca supplement.   7.  Prophylaxis.  I will plan a foot exam next visit.  We last got a copy of a note from the eye doctor on 10/15/2023 [facility]: [phone]) she had moderate non-proliferative retinopathy, no treatment needed.  She says she has an upcoming appt. When she is seen, I told her to have them send me a copy of the report.   8.  Patient assistance.  She gets her Tresiba  from [employer].  9.  She will return to clinic in 4 months.  She will have a only labs next week    Addendum 11/23/2024: MA/CR <71.4 (albumin <7, creatinine 9.8).  Vitamin D27.5.  TSH 3.882.  LDL 57.  Fructosamine 288 (~A1c 6.5).  A1c 8.6.  CMP unremarkable.  No changes made.  This video encounter was conducted with the patient's (or proxy's) verbal consent via  secure, interactive  audio and video telecommunications while in clinic/office/hospital.  The patient (or proxy) was instructed to have this encounter in a suitably private space and to only have persons present to whom they give permission to participate. In addition, patient identity was confirmed by use of name plus an additional identifier.  This visit was coded based on medical decision making (MDM).    This note is partially prepared by Earla Daria Messier, Scribe, in the presence of and acting as the scribe of Dr. Debby Breaker , MD.    [facility], MD

## 2024-11-21 ENCOUNTER — Telehealth: Payer: Self-pay | Admitting: Pharmacy Technician

## 2024-12-19 ENCOUNTER — Inpatient Hospital Stay
Admission: EM | Admit: 2024-12-19 | Discharge: 2024-12-22 | DRG: 872 | Disposition: A | Discharge status: Home / Self Care | Payer: Medicare (Managed Care) | Attending: Internal Medicine | Admitting: Internal Medicine

## 2024-12-19 ENCOUNTER — Emergency Department: Payer: Medicare (Managed Care)

## 2024-12-19 DIAGNOSIS — E119 Type 2 diabetes mellitus without complications: Secondary | ICD-10-CM | POA: Diagnosis present

## 2024-12-19 DIAGNOSIS — L089 Local infection of the skin and subcutaneous tissue, unspecified: Secondary | ICD-10-CM

## 2024-12-19 DIAGNOSIS — I700 Atherosclerosis of aorta: Secondary | ICD-10-CM | POA: Diagnosis present

## 2024-12-19 DIAGNOSIS — F419 Anxiety disorder, unspecified: Secondary | ICD-10-CM | POA: Diagnosis present

## 2024-12-19 DIAGNOSIS — L03311 Cellulitis of abdominal wall: Principal | ICD-10-CM | POA: Diagnosis present

## 2024-12-19 DIAGNOSIS — I2510 Atherosclerotic heart disease of native coronary artery without angina pectoris: Secondary | ICD-10-CM | POA: Diagnosis present

## 2024-12-19 DIAGNOSIS — Z7984 Long term (current) use of oral hypoglycemic drugs: Secondary | ICD-10-CM

## 2024-12-19 DIAGNOSIS — G4733 Obstructive sleep apnea (adult) (pediatric): Secondary | ICD-10-CM | POA: Diagnosis present

## 2024-12-19 DIAGNOSIS — Z8673 Personal history of transient ischemic attack (TIA), and cerebral infarction without residual deficits: Secondary | ICD-10-CM

## 2024-12-19 DIAGNOSIS — G4700 Insomnia, unspecified: Secondary | ICD-10-CM | POA: Diagnosis present

## 2024-12-19 DIAGNOSIS — Z79899 Other long term (current) drug therapy: Secondary | ICD-10-CM

## 2024-12-19 DIAGNOSIS — F17210 Nicotine dependence, cigarettes, uncomplicated: Secondary | ICD-10-CM | POA: Diagnosis present

## 2024-12-19 DIAGNOSIS — G40909 Epilepsy, unspecified, not intractable, without status epilepticus: Secondary | ICD-10-CM | POA: Diagnosis present

## 2024-12-19 DIAGNOSIS — F319 Bipolar disorder, unspecified: Secondary | ICD-10-CM | POA: Diagnosis present

## 2024-12-19 DIAGNOSIS — E785 Hyperlipidemia, unspecified: Secondary | ICD-10-CM | POA: Diagnosis present

## 2024-12-19 DIAGNOSIS — E669 Obesity, unspecified: Secondary | ICD-10-CM | POA: Diagnosis present

## 2024-12-19 DIAGNOSIS — Z833 Family history of diabetes mellitus: Secondary | ICD-10-CM

## 2024-12-19 DIAGNOSIS — A419 Sepsis, unspecified organism: Principal | ICD-10-CM | POA: Diagnosis present

## 2024-12-19 DIAGNOSIS — Z7982 Long term (current) use of aspirin: Secondary | ICD-10-CM

## 2024-12-19 DIAGNOSIS — Z794 Long term (current) use of insulin: Secondary | ICD-10-CM

## 2024-12-19 DIAGNOSIS — I10 Essential (primary) hypertension: Secondary | ICD-10-CM | POA: Diagnosis present

## 2024-12-19 DIAGNOSIS — F3178 Bipolar disorder, in full remission, most recent episode mixed: Secondary | ICD-10-CM

## 2024-12-19 DIAGNOSIS — F3162 Bipolar disorder, current episode mixed, moderate: Secondary | ICD-10-CM

## 2024-12-19 DIAGNOSIS — G20A1 Parkinson's disease without dyskinesia, without mention of fluctuations: Secondary | ICD-10-CM | POA: Diagnosis present

## 2024-12-19 DIAGNOSIS — Z825 Family history of asthma and other chronic lower respiratory diseases: Secondary | ICD-10-CM

## 2024-12-19 DIAGNOSIS — Z6841 Body Mass Index (BMI) 40.0 and over, adult: Secondary | ICD-10-CM

## 2024-12-19 DIAGNOSIS — F4310 Post-traumatic stress disorder, unspecified: Secondary | ICD-10-CM | POA: Diagnosis present

## 2024-12-19 DIAGNOSIS — Z888 Allergy status to other drugs, medicaments and biological substances status: Secondary | ICD-10-CM

## 2024-12-19 DIAGNOSIS — Z882 Allergy status to sulfonamides status: Secondary | ICD-10-CM

## 2024-12-19 DIAGNOSIS — Z1152 Encounter for screening for COVID-19: Secondary | ICD-10-CM

## 2024-12-19 DIAGNOSIS — R509 Fever, unspecified: Secondary | ICD-10-CM

## 2024-12-19 LAB — CBC WITH DIFFERENTIAL/PLATELET
Abs Immature Granulocytes: 0.07 K/uL (ref 0.00–0.07)
Basophils Absolute: 0.1 K/uL (ref 0.0–0.1)
Basophils Relative: 1 %
Eosinophils Absolute: 0.1 K/uL (ref 0.0–0.5)
Eosinophils Relative: 1 %
HCT: 47.6 % — ABNORMAL HIGH (ref 36.0–46.0)
Hemoglobin: 15.8 g/dL — ABNORMAL HIGH (ref 12.0–15.0)
Immature Granulocytes: 1 %
Lymphocytes Relative: 11 %
Lymphs Abs: 1.6 K/uL (ref 0.7–4.0)
MCH: 32.6 pg (ref 26.0–34.0)
MCHC: 33.2 g/dL (ref 30.0–36.0)
MCV: 98.1 fL (ref 80.0–100.0)
Monocytes Absolute: 1.1 K/uL — ABNORMAL HIGH (ref 0.1–1.0)
Monocytes Relative: 8 %
Neutro Abs: 11.7 K/uL — ABNORMAL HIGH (ref 1.7–7.7)
Neutrophils Relative %: 78 %
Platelets: 286 K/uL (ref 150–400)
RBC: 4.85 MIL/uL (ref 3.87–5.11)
RDW: 13.2 % (ref 11.5–15.5)
WBC: 14.7 K/uL — ABNORMAL HIGH (ref 4.0–10.5)
nRBC: 0 % (ref 0.0–0.2)

## 2024-12-19 LAB — URINALYSIS, W/ REFLEX TO CULTURE (INFECTION SUSPECTED)
Bilirubin Urine: NEGATIVE
Glucose, UA: >=500 mg/dL — AB
Ketones, ur: NEGATIVE mg/dL
Leukocytes,Ua: NEGATIVE
Nitrite: NEGATIVE
Protein, ur: NEGATIVE mg/dL
Specific Gravity, Urine: 1.027 (ref 1.005–1.030)
pH: 5 (ref 5.0–8.0)

## 2024-12-19 LAB — PROTIME-INR
INR: 1 (ref 0.8–1.2)
Prothrombin Time: 13.2 s (ref 11.4–15.2)

## 2024-12-19 LAB — RESP PANEL BY RT-PCR (RSV, FLU A&B, COVID)  RVPGX2
Influenza A by PCR: NEGATIVE
Influenza B by PCR: NEGATIVE
Resp Syncytial Virus by PCR: NEGATIVE
SARS Coronavirus 2 by RT PCR: NEGATIVE

## 2024-12-19 LAB — COMPREHENSIVE METABOLIC PANEL WITH GFR
ALT: 16 U/L (ref 0–44)
AST: 16 U/L (ref 15–41)
Albumin: 4.4 g/dL (ref 3.5–5.0)
Alkaline Phosphatase: 107 U/L (ref 38–126)
Anion gap: 15 (ref 5–15)
BUN: 17 mg/dL (ref 8–23)
CO2: 23 mmol/L (ref 22–32)
Calcium: 9.8 mg/dL (ref 8.9–10.3)
Chloride: 98 mmol/L (ref 98–111)
Creatinine, Ser: 0.84 mg/dL (ref 0.44–1.00)
GFR, Estimated: >60 mL/min
Glucose, Bld: 193 mg/dL — ABNORMAL HIGH (ref 70–99)
Potassium: 4 mmol/L (ref 3.5–5.1)
Sodium: 137 mmol/L (ref 135–145)
Total Bilirubin: 0.8 mg/dL (ref 0.0–1.2)
Total Protein: 7 g/dL (ref 6.5–8.1)

## 2024-12-19 LAB — LACTIC ACID, PLASMA
Lactic Acid, Venous: 1.3 mmol/L (ref 0.5–1.9)
Lactic Acid, Venous: 1.8 mmol/L (ref 0.5–1.9)

## 2024-12-19 MED ORDER — ACETAMINOPHEN 325 MG PO TABS: 650.0000 mg | ORAL_TABLET | Freq: Once | ORAL | Status: DC

## 2024-12-19 MED ORDER — ACETAMINOPHEN 500 MG PO TABS
ORAL_TABLET | ORAL | Status: AC
  Administered 2024-12-19: 1000 mg via ORAL
  Filled 2024-12-19: qty 2

## 2024-12-19 MED ORDER — ACETAMINOPHEN 500 MG PO TABS: 1000.0000 mg | ORAL_TABLET | Freq: Once | ORAL | Status: AC

## 2024-12-19 MED ORDER — SODIUM CHLORIDE 0.9 % IV SOLN
2.0000 g | Freq: Once | INTRAVENOUS | Status: AC
  Administered 2024-12-19: 2 g via INTRAVENOUS
  Filled 2024-12-19: qty 12.5

## 2024-12-19 MED ORDER — VANCOMYCIN HCL IN DEXTROSE 1-5 GM/200ML-% IV SOLN
1000.0000 mg | Freq: Once | INTRAVENOUS | Status: AC
  Administered 2024-12-20: 1000 mg via INTRAVENOUS
  Filled 2024-12-19: qty 200

## 2024-12-19 MED ORDER — IOHEXOL 350 MG/ML SOLN
100.0000 mL | Freq: Once | INTRAVENOUS | Status: AC | PRN
  Administered 2024-12-19: 100 mL via INTRAVENOUS

## 2024-12-19 MED ORDER — METRONIDAZOLE 500 MG/100ML IV SOLN
500.0000 mg | Freq: Once | INTRAVENOUS | Status: AC
  Administered 2024-12-19: 500 mg via INTRAVENOUS
  Filled 2024-12-19: qty 100

## 2024-12-19 MED ORDER — LACTATED RINGERS IV SOLN: INTRAVENOUS | Status: AC

## 2024-12-19 MED ORDER — LACTATED RINGERS IV BOLUS (SEPSIS)
1000.0000 mL | Freq: Once | INTRAVENOUS | Status: AC
  Administered 2024-12-19: 1000 mL via INTRAVENOUS

## 2024-12-19 MED ORDER — MORPHINE SULFATE (PF) 4 MG/ML IV SOLN
4.0000 mg | Freq: Once | INTRAVENOUS | Status: AC
  Administered 2024-12-19: 4 mg via INTRAVENOUS
  Filled 2024-12-19: qty 1

## 2024-12-19 MED ORDER — ACETAMINOPHEN 500 MG PO TABS: 1000.0000 mg | ORAL_TABLET | Freq: Once | ORAL | Status: DC

## 2024-12-19 NOTE — ED Notes (Signed)
 Delay in starting ABX as pt had to go to the bathroom and now in CT

## 2024-12-19 NOTE — ED Triage Notes (Signed)
 Pt presents to the ED via POV form home with husband. Pt reports a very large wound in her LLQ of her abdomen that she first noticed yesterday morning. Pt has a temp of 100.4 at time of triage. Hx of DM.  Pt evaluated by Devere, PA at time of triage.

## 2024-12-19 NOTE — ED Notes (Signed)
 IV started and one set of cultures sent to lab

## 2024-12-19 NOTE — Sepsis Progress Note (Signed)
 Elink monitoring for the code sepsis protocol.

## 2024-12-19 NOTE — Progress Notes (Signed)
 CODE SEPSIS - PHARMACY COMMUNICATION  **Broad Spectrum Antibiotics should be administered within 1 hour of Sepsis diagnosis**  Time Code Sepsis Called/Page Received: 21:17  Antibiotics Ordered: vancomycin /cefepime /metronidazole   Time of 1st antibiotic administration: ?  Additional action taken by pharmacy: none  If necessary, Name of Provider/Nurse Contacted: E link RN contacted bedside RN to give antibiotics     Debra [name] ,PharmD Clinical Pharmacist  12/19/2024  9:18 PM

## 2024-12-19 NOTE — ED Notes (Signed)
 Pt had an episode of incontinence and reports needing to go to the bathroom. Pt assisted to and from the bathroom. Provided with hospital disposable underwear and large hospital gown. Was able to obtain urine sample. Pt increased work of breathing with exertion that improved with rest.

## 2024-12-19 NOTE — ED Provider Triage Note (Signed)
 Emergency Medicine Provider Triage Evaluation Note  Debra [name] , a 64 y.o. female  was evaluated in triage.  Pt complains of fever, chills, abdominal skin infection, mild cough, symptoms started last night.  Review of Systems  Positive:  Negative:   Physical Exam  There were no vitals taken for this visit. Gen:   Awake, no distress   Resp:  Normal effort  MSK:   Moves extremities without difficulty  Other:  Patient's abdomen has a very large reddened tender area on the left lower quadrant  Medical Decision Making  Medically screening exam initiated at 6:04 PM.  Appropriate orders placed.  Debra [name] was informed that the remainder of the evaluation will be completed by another provider, this initial triage assessment does not replace that evaluation, and the importance of remaining in the ED until their evaluation is complete.  Sepsis protocols initiated   Gasper Devere ORN, PA-C 12/19/24 1810

## 2024-12-19 NOTE — ED Provider Notes (Signed)
 "  [facility] Provider Note    Event Date/Time   First MD Initiated Contact with Patient 12/19/24 2057     (approximate)   History   Wound Infection   HPI  Debra [name] is a 64 y.o. female past medical history significant for diabetes, who presents to the emergency department for lower abdominal pain and fever.  Patient states that yesterday she started having a wound to her left lower abdomen.  States that she started having a fever at home.  States that she has a history of diabetes but that her glucose is usually well-controlled.  States that she has had some drainage from the wound.  Endorses lower abdominal pain.  Denies nausea or vomiting.  Denies any shortness of breath.  Does endorse some mild cough and congestion.  Denies dysuria, urinary urgency or frequency     Physical Exam   Triage Vital Signs: ED Triage Vitals  Encounter Vitals Group     BP 12/19/24 1804 (!) 144/93     Girls Systolic BP Percentile --      Girls Diastolic BP Percentile --      Boys Systolic BP Percentile --      Boys Diastolic BP Percentile --      Pulse Rate 12/19/24 1804 93     Resp 12/19/24 1804 20     Temp 12/19/24 1804 (!) 100.4 F (38 C)     Temp Source 12/19/24 1804 Oral     SpO2 12/19/24 1804 95 %     Weight 12/19/24 1805 286 lb (129.7 kg)     Height 12/19/24 1805 5' 5 (1.651 m)     Head Circumference --      Peak Flow --      Pain Score 12/19/24 1804 10     Pain Loc --      Pain Education --      Exclude from Growth Chart --     Most recent vital signs: Vitals:   12/19/24 2109 12/19/24 2242  BP: (!) 145/93 (!) 150/63  Pulse: 89 85  Resp: (!) 22 (!) 23  Temp:  99.1 F (37.3 C)  SpO2: 94% 99%    Physical Exam Constitutional:      Appearance: She is well-developed.  HENT:     Head: Atraumatic.  Eyes:     Conjunctiva/sclera: Conjunctivae normal.  Cardiovascular:     Rate and Rhythm: Regular rhythm.  Pulmonary:     Effort: No respiratory  distress.  Abdominal:     General: There is no distension.     Tenderness: There is abdominal tenderness (Wound that is draining to the lower abdomen.  No fluctuance.  Surrounding erythema and warmth.  No crepitance.).  Musculoskeletal:        General: Normal range of motion.     Cervical back: Normal range of motion.  Skin:    General: Skin is warm.     Capillary Refill: Capillary refill takes less than 2 seconds.  Neurological:     Mental Status: She is alert. Mental status is at baseline.     IMPRESSION / MDM / ASSESSMENT AND PLAN / ED COURSE  I reviewed the triage vital signs and the nursing notes.  Differential diagnosis including sepsis, intra-abdominal wound, intra-abdominal cellulitis, abscess, Fournier's  Patient with fever of 103, normotensive, concern for possible sepsis given source of an abdominal wound, fever and leukocytosis.  Started on antibiotics to cover for intra-abdominal source and cellulitis that is  purulent  EKG  I, Clotilda Punter, the attending physician, personally viewed and interpreted this ECG.   Rate: Normal  Rhythm: Normal sinus  Axis: Normal  Intervals: Normal  ST&T Change: None  No tachycardic or bradycardic dysrhythmias while on cardiac telemetry.  RADIOLOGY  LABS (all labs ordered are listed, but only abnormal results are displayed) Labs interpreted as -    Labs Reviewed  COMPREHENSIVE METABOLIC PANEL WITH GFR - Abnormal; Notable for the following components:      Result Value   Glucose, Bld 193 (*)    All other components within normal limits  CBC WITH DIFFERENTIAL/PLATELET - Abnormal; Notable for the following components:   WBC 14.7 (*)    Hemoglobin 15.8 (*)    HCT 47.6 (*)    Neutro Abs 11.7 (*)    Monocytes Absolute 1.1 (*)    All other components within normal limits  URINALYSIS, W/ REFLEX TO CULTURE (INFECTION SUSPECTED) - Abnormal; Notable for the following components:   Color, Urine YELLOW (*)    APPearance CLEAR (*)     Glucose, UA >=500 (*)    Hgb urine dipstick MODERATE (*)    Bacteria, UA RARE (*)    All other components within normal limits  RESP PANEL BY RT-PCR (RSV, FLU A&B, COVID)  RVPGX2  CULTURE, BLOOD (ROUTINE X 2)  CULTURE, BLOOD (ROUTINE X 2)  LACTIC ACID, PLASMA  LACTIC ACID, PLASMA  PROTIME-INR     MDM  Patient with leukocytosis of 14.7.  Hemoglobin 15.8.  Normal platelets.  Creatinine at baseline with no significant electrolyte abnormality.  Glucose stable at 193.  Normal lactic acid at 1.3.  COVID and influenza testing are negative.  No signs of urinary tract infection.  Patient given IV fluids and started on antibiotics.  CT scan abdomen and pelvis with no findings consistent with Fournier's, no crepitance.  No signs of an abscess.  Given IV morphine  for pain control.  Chest x-ray with no findings consistent with pneumonia.  Concern for cellulitis with possible sepsis given fever of 103.  Consulted hospitalist for admission     PROCEDURES:  Critical Care performed: yes  .Critical Care  Performed by: Punter Clotilda, MD Authorized by: Punter Clotilda, MD   Critical care provider statement:    Critical care time (minutes):  30   Critical care was necessary to treat or prevent imminent or life-threatening deterioration of the following conditions:  Sepsis   Critical care was time spent personally by me on the following activities:  Development of treatment plan with patient or surrogate, discussions with consultants, evaluation of patient's response to treatment, examination of patient, ordering and review of laboratory studies, ordering and review of radiographic studies, ordering and performing treatments and interventions, pulse oximetry, re-evaluation of patient's condition and review of old charts   Care discussed with: admitting provider     Patient's presentation is most consistent with acute presentation with potential threat to life or bodily  function.   MEDICATIONS ORDERED IN ED: Medications  acetaminophen  (TYLENOL ) tablet 1,000 mg (1,000 mg Oral Not Given 12/19/24 2144)  lactated ringers  infusion ( Intravenous New Bag/Given 12/19/24 2237)  lactated ringers  bolus 1,000 mL (1,000 mLs Intravenous New Bag/Given 12/19/24 2237)  metroNIDAZOLE  (FLAGYL ) IVPB 500 mg (500 mg Intravenous New Bag/Given 12/19/24 2238)  vancomycin  (VANCOCIN ) IVPB 1000 mg/200 mL premix (has no administration in time range)  ceFEPIme  (MAXIPIME ) 2 g in sodium chloride  0.9 % 100 mL IVPB (2 g Intravenous New Bag/Given 12/19/24 2236)  morphine  (PF) 4 MG/ML injection 4 mg (4 mg Intravenous Given 12/19/24 2240)  acetaminophen  (TYLENOL ) tablet 1,000 mg (1,000 mg Oral Given by Other 12/19/24 2115)  iohexol  (OMNIPAQUE ) 350 MG/ML injection 100 mL (100 mLs Intravenous Contrast Given 12/19/24 2220)    FINAL CLINICAL IMPRESSION(S) / ED DIAGNOSES   Final diagnoses:  Cellulitis of abdominal wall  Wound infection  Fever, unspecified fever cause     Rx / DC Orders   ED Discharge Orders     None        Note:  This document was prepared using Dragon voice recognition software and may include unintentional dictation errors.   Suzanne Kirsch, MD 12/19/24 2329  "

## 2024-12-19 NOTE — Sepsis Progress Note (Signed)
 Notified bedside nurse of need to administer antibiotics.

## 2024-12-20 ENCOUNTER — Encounter: Payer: Self-pay | Admitting: Family Medicine

## 2024-12-20 ENCOUNTER — Other Ambulatory Visit: Payer: Self-pay

## 2024-12-20 DIAGNOSIS — L0390 Cellulitis, unspecified: Secondary | ICD-10-CM

## 2024-12-20 DIAGNOSIS — F319 Bipolar disorder, unspecified: Secondary | ICD-10-CM | POA: Insufficient documentation

## 2024-12-20 DIAGNOSIS — L03311 Cellulitis of abdominal wall: Secondary | ICD-10-CM | POA: Diagnosis present

## 2024-12-20 DIAGNOSIS — E785 Hyperlipidemia, unspecified: Secondary | ICD-10-CM | POA: Diagnosis present

## 2024-12-20 DIAGNOSIS — A419 Sepsis, unspecified organism: Secondary | ICD-10-CM | POA: Diagnosis present

## 2024-12-20 DIAGNOSIS — I2510 Atherosclerotic heart disease of native coronary artery without angina pectoris: Secondary | ICD-10-CM | POA: Diagnosis present

## 2024-12-20 DIAGNOSIS — F4310 Post-traumatic stress disorder, unspecified: Secondary | ICD-10-CM | POA: Diagnosis present

## 2024-12-20 DIAGNOSIS — Z7982 Long term (current) use of aspirin: Secondary | ICD-10-CM | POA: Diagnosis not present

## 2024-12-20 DIAGNOSIS — Z825 Family history of asthma and other chronic lower respiratory diseases: Secondary | ICD-10-CM | POA: Diagnosis not present

## 2024-12-20 DIAGNOSIS — F419 Anxiety disorder, unspecified: Secondary | ICD-10-CM | POA: Diagnosis present

## 2024-12-20 DIAGNOSIS — Z833 Family history of diabetes mellitus: Secondary | ICD-10-CM | POA: Diagnosis not present

## 2024-12-20 DIAGNOSIS — I700 Atherosclerosis of aorta: Secondary | ICD-10-CM | POA: Diagnosis present

## 2024-12-20 DIAGNOSIS — Z794 Long term (current) use of insulin: Secondary | ICD-10-CM | POA: Diagnosis not present

## 2024-12-20 DIAGNOSIS — Z6841 Body Mass Index (BMI) 40.0 and over, adult: Secondary | ICD-10-CM | POA: Diagnosis not present

## 2024-12-20 DIAGNOSIS — Z7984 Long term (current) use of oral hypoglycemic drugs: Secondary | ICD-10-CM | POA: Diagnosis not present

## 2024-12-20 DIAGNOSIS — Z888 Allergy status to other drugs, medicaments and biological substances status: Secondary | ICD-10-CM | POA: Diagnosis not present

## 2024-12-20 DIAGNOSIS — E119 Type 2 diabetes mellitus without complications: Secondary | ICD-10-CM | POA: Diagnosis present

## 2024-12-20 DIAGNOSIS — Z1152 Encounter for screening for COVID-19: Secondary | ICD-10-CM | POA: Diagnosis not present

## 2024-12-20 DIAGNOSIS — E669 Obesity, unspecified: Secondary | ICD-10-CM | POA: Diagnosis present

## 2024-12-20 DIAGNOSIS — I10 Essential (primary) hypertension: Secondary | ICD-10-CM | POA: Diagnosis present

## 2024-12-20 DIAGNOSIS — G40909 Epilepsy, unspecified, not intractable, without status epilepticus: Secondary | ICD-10-CM | POA: Diagnosis present

## 2024-12-20 DIAGNOSIS — F17210 Nicotine dependence, cigarettes, uncomplicated: Secondary | ICD-10-CM | POA: Diagnosis present

## 2024-12-20 DIAGNOSIS — Z8673 Personal history of transient ischemic attack (TIA), and cerebral infarction without residual deficits: Secondary | ICD-10-CM | POA: Diagnosis not present

## 2024-12-20 DIAGNOSIS — Z882 Allergy status to sulfonamides status: Secondary | ICD-10-CM | POA: Diagnosis not present

## 2024-12-20 DIAGNOSIS — G20A1 Parkinson's disease without dyskinesia, without mention of fluctuations: Secondary | ICD-10-CM | POA: Diagnosis present

## 2024-12-20 LAB — BASIC METABOLIC PANEL WITH GFR
Anion gap: 13 (ref 5–15)
BUN: 15 mg/dL (ref 8–23)
CO2: 20 mmol/L — ABNORMAL LOW (ref 22–32)
Calcium: 8.8 mg/dL — ABNORMAL LOW (ref 8.9–10.3)
Chloride: 103 mmol/L (ref 98–111)
Creatinine, Ser: 0.73 mg/dL (ref 0.44–1.00)
GFR, Estimated: >60 mL/min
Glucose, Bld: 114 mg/dL — ABNORMAL HIGH (ref 70–99)
Potassium: 3.9 mmol/L (ref 3.5–5.1)
Sodium: 136 mmol/L (ref 135–145)

## 2024-12-20 LAB — CBC
HCT: 43.4 % (ref 36.0–46.0)
Hemoglobin: 14.4 g/dL (ref 12.0–15.0)
MCH: 32 pg (ref 26.0–34.0)
MCHC: 33.2 g/dL (ref 30.0–36.0)
MCV: 96.4 fL (ref 80.0–100.0)
Platelets: 245 K/uL (ref 150–400)
RBC: 4.5 MIL/uL (ref 3.87–5.11)
RDW: 13.4 % (ref 11.5–15.5)
WBC: 14.3 K/uL — ABNORMAL HIGH (ref 4.0–10.5)
nRBC: 0 % (ref 0.0–0.2)

## 2024-12-20 LAB — PROTIME-INR
INR: >10 (ref 0.8–1.2)
INR: 1.1 (ref 0.8–1.2)
Prothrombin Time: >90 s — ABNORMAL HIGH (ref 11.4–15.2)
Prothrombin Time: 14.3 s (ref 11.4–15.2)

## 2024-12-20 LAB — CBG MONITORING, ED
Glucose-Capillary: 119 mg/dL — ABNORMAL HIGH (ref 70–99)
Glucose-Capillary: 156 mg/dL — ABNORMAL HIGH (ref 70–99)

## 2024-12-20 LAB — HEMOGLOBIN A1C
Hgb A1c MFr Bld: 7.8 % — ABNORMAL HIGH (ref 4.8–5.6)
Mean Plasma Glucose: 177.16 mg/dL

## 2024-12-20 LAB — HIV ANTIBODY (ROUTINE TESTING W REFLEX): HIV Screen 4th Generation wRfx: NONREACTIVE

## 2024-12-20 LAB — CORTISOL-AM, BLOOD: Cortisol - AM: 15.1 ug/dL (ref 6.7–22.6)

## 2024-12-20 LAB — GLUCOSE, CAPILLARY
Glucose-Capillary: 141 mg/dL — ABNORMAL HIGH (ref 70–99)
Glucose-Capillary: 125 mg/dL — ABNORMAL HIGH (ref 70–99)

## 2024-12-20 MED ORDER — MAGNESIUM HYDROXIDE 400 MG/5ML PO SUSP: 30.0000 mL | Freq: Every day | ORAL | Status: DC | PRN

## 2024-12-20 MED ORDER — LACTATED RINGERS IV SOLN
150.0000 mL/h | INTRAVENOUS | Status: AC
  Administered 2024-12-20: 150 mL/h via INTRAVENOUS

## 2024-12-20 MED ORDER — ACETAMINOPHEN 325 MG PO TABS
650.0000 mg | ORAL_TABLET | Freq: Four times a day (QID) | ORAL | Status: DC | PRN
  Administered 2024-12-20: 650 mg via ORAL
  Filled 2024-12-20: qty 2

## 2024-12-20 MED ORDER — MORPHINE SULFATE (PF) 2 MG/ML IV SOLN
2.0000 mg | INTRAVENOUS | Status: DC | PRN
  Administered 2024-12-20 – 2024-12-21 (×2): 2 mg via INTRAVENOUS
  Filled 2024-12-20 (×2): qty 1

## 2024-12-20 MED ORDER — LAMOTRIGINE 100 MG PO TABS
100.0000 mg | ORAL_TABLET | Freq: Two times a day (BID) | ORAL | Status: DC
  Administered 2024-12-20 – 2024-12-22 (×5): 100 mg via ORAL
  Filled 2024-12-20 (×5): qty 1

## 2024-12-20 MED ORDER — ASPIRIN 81 MG PO TBEC
81.0000 mg | DELAYED_RELEASE_TABLET | Freq: Every day | ORAL | Status: DC
  Administered 2024-12-20 – 2024-12-22 (×3): 81 mg via ORAL
  Filled 2024-12-20 (×3): qty 1

## 2024-12-20 MED ORDER — INSULIN ASPART 100 UNIT/ML IJ SOLN
0.0000 [IU] | Freq: Three times a day (TID) | INTRAMUSCULAR | Status: DC
  Administered 2024-12-20 – 2024-12-21 (×3): 3 [IU] via SUBCUTANEOUS
  Filled 2024-12-20 (×3): qty 3

## 2024-12-20 MED ORDER — ENOXAPARIN SODIUM 80 MG/0.8ML IJ SOSY
0.5000 mg/kg | PREFILLED_SYRINGE | INTRAMUSCULAR | Status: DC
  Administered 2024-12-20 – 2024-12-21 (×2): 65 mg via SUBCUTANEOUS
  Filled 2024-12-20 (×3): qty 0.65

## 2024-12-20 MED ORDER — PAROXETINE HCL 20 MG PO TABS
40.0000 mg | ORAL_TABLET | Freq: Every day | ORAL | Status: DC
  Administered 2024-12-20 – 2024-12-22 (×3): 40 mg via ORAL
  Filled 2024-12-20 (×3): qty 2

## 2024-12-20 MED ORDER — SODIUM CHLORIDE 0.9 % IV SOLN: 2.0000 g | Freq: Once | INTRAVENOUS | Status: DC

## 2024-12-20 MED ORDER — INSULIN GLARGINE 100 UNIT/ML ~~LOC~~ SOLN
100.0000 [IU] | Freq: Every day | SUBCUTANEOUS | Status: DC
  Administered 2024-12-20 – 2024-12-22 (×3): 100 [IU] via SUBCUTANEOUS
  Filled 2024-12-20 (×3): qty 1

## 2024-12-20 MED ORDER — BENZONATATE 100 MG PO CAPS: 100.0000 mg | ORAL_CAPSULE | Freq: Three times a day (TID) | ORAL | Status: DC | PRN

## 2024-12-20 MED ORDER — ACETAMINOPHEN 650 MG RE SUPP: 650.0000 mg | Freq: Four times a day (QID) | RECTAL | Status: DC | PRN

## 2024-12-20 MED ORDER — IPRATROPIUM BROMIDE 0.03 % NA SOLN
1.0000 | Freq: Two times a day (BID) | NASAL | Status: DC
  Administered 2024-12-22: 1 via NASAL
  Filled 2024-12-20 (×2): qty 30

## 2024-12-20 MED ORDER — INSULIN DEGLUDEC 200 UNIT/ML ~~LOC~~ SOPN: 140.0000 [IU] | PEN_INJECTOR | Freq: Every morning | SUBCUTANEOUS | Status: DC

## 2024-12-20 MED ORDER — CLONAZEPAM 0.5 MG PO TABS: 0.5000 mg | ORAL_TABLET | ORAL | Status: DC

## 2024-12-20 MED ORDER — SODIUM CHLORIDE 0.9 % IV SOLN
2.0000 g | Freq: Three times a day (TID) | INTRAVENOUS | Status: DC
  Administered 2024-12-20 – 2024-12-22 (×7): 2 g via INTRAVENOUS
  Filled 2024-12-20 (×8): qty 12.5

## 2024-12-20 MED ORDER — DAPAGLIFLOZIN PROPANEDIOL 10 MG PO TABS: 10.0000 mg | ORAL_TABLET | Freq: Every day | ORAL | Status: DC

## 2024-12-20 MED ORDER — PIOGLITAZONE HCL 30 MG PO TABS
30.0000 mg | ORAL_TABLET | Freq: Every day | ORAL | Status: DC
  Administered 2024-12-20 – 2024-12-22 (×3): 30 mg via ORAL
  Filled 2024-12-20 (×3): qty 1

## 2024-12-20 MED ORDER — FUROSEMIDE 20 MG PO TABS
20.0000 mg | ORAL_TABLET | Freq: Every day | ORAL | Status: DC
  Administered 2024-12-20 – 2024-12-22 (×3): 20 mg via ORAL
  Filled 2024-12-20 (×3): qty 1

## 2024-12-20 MED ORDER — INSULIN ASPART 100 UNIT/ML IJ SOLN: 0.0000 [IU] | Freq: Every day | INTRAMUSCULAR | Status: DC

## 2024-12-20 MED ORDER — ONDANSETRON HCL 4 MG/2ML IJ SOLN: 4.0000 mg | Freq: Four times a day (QID) | INTRAMUSCULAR | Status: DC | PRN

## 2024-12-20 MED ORDER — VANCOMYCIN HCL IN DEXTROSE 1-5 GM/200ML-% IV SOLN: 1000.0000 mg | Freq: Once | INTRAVENOUS | Status: DC

## 2024-12-20 MED ORDER — TOPIRAMATE 100 MG PO TABS
100.0000 mg | ORAL_TABLET | Freq: Two times a day (BID) | ORAL | Status: DC
  Administered 2024-12-20 – 2024-12-22 (×5): 100 mg via ORAL
  Filled 2024-12-20: qty 1
  Filled 2024-12-20: qty 4
  Filled 2024-12-20 (×3): qty 1

## 2024-12-20 MED ORDER — VANCOMYCIN HCL 1500 MG/300ML IV SOLN
1500.0000 mg | Freq: Once | INTRAVENOUS | Status: AC
  Administered 2024-12-20: 1500 mg via INTRAVENOUS
  Filled 2024-12-20: qty 300

## 2024-12-20 MED ORDER — TRAZODONE HCL 50 MG PO TABS: 25.0000 mg | ORAL_TABLET | Freq: Every evening | ORAL | Status: DC | PRN

## 2024-12-20 MED ORDER — VITAMIN D 25 MCG (1000 UNIT) PO TABS
1000.0000 [IU] | ORAL_TABLET | Freq: Every day | ORAL | Status: DC
  Administered 2024-12-20 – 2024-12-22 (×3): 1000 [IU] via ORAL
  Filled 2024-12-20 (×3): qty 1

## 2024-12-20 MED ORDER — CALCIUM CARBONATE 1500 (600 CA) MG PO TABS: 1.0000 | ORAL_TABLET | Freq: Every day | ORAL | Status: DC

## 2024-12-20 MED ORDER — LISINOPRIL 20 MG PO TABS
20.0000 mg | ORAL_TABLET | Freq: Every day | ORAL | Status: DC
  Administered 2024-12-20 – 2024-12-22 (×3): 20 mg via ORAL
  Filled 2024-12-20 (×2): qty 1
  Filled 2024-12-20: qty 2

## 2024-12-20 MED ORDER — VITAMIN K1 10 MG/ML IJ SOLN
5.0000 mg | Freq: Once | INTRAVENOUS | Status: DC
  Filled 2024-12-20: qty 0.5

## 2024-12-20 MED ORDER — VANCOMYCIN HCL 2000 MG/400ML IV SOLN
2000.0000 mg | INTRAVENOUS | Status: DC
  Administered 2024-12-20 – 2024-12-21 (×2): 2000 mg via INTRAVENOUS
  Filled 2024-12-20 (×2): qty 400

## 2024-12-20 MED ORDER — ONDANSETRON HCL 4 MG PO TABS: 4.0000 mg | ORAL_TABLET | Freq: Four times a day (QID) | ORAL | Status: DC | PRN

## 2024-12-20 MED ORDER — VANCOMYCIN HCL 1250 MG/250ML IV SOLN
1250.0000 mg | Freq: Two times a day (BID) | INTRAVENOUS | Status: DC
  Filled 2024-12-20: qty 250

## 2024-12-20 MED ORDER — VANCOMYCIN HCL 1750 MG/350ML IV SOLN: 1750.0000 mg | INTRAVENOUS | Status: DC

## 2024-12-20 MED ORDER — EMPAGLIFLOZIN 25 MG PO TABS
25.0000 mg | ORAL_TABLET | Freq: Every day | ORAL | Status: DC
  Administered 2024-12-20 – 2024-12-22 (×3): 25 mg via ORAL
  Filled 2024-12-20 (×3): qty 1

## 2024-12-20 MED ORDER — POTASSIUM CHLORIDE 20 MEQ PO PACK
20.0000 meq | PACK | Freq: Every day | ORAL | Status: DC
  Administered 2024-12-20 – 2024-12-21 (×2): 20 meq via ORAL
  Filled 2024-12-20 (×3): qty 1

## 2024-12-20 NOTE — Progress Notes (Signed)
 PHARMACIST - PHYSICIAN COMMUNICATION  CONCERNING:  Enoxaparin  (Lovenox ) for DVT Prophylaxis    RECOMMENDATION: Patient was prescribed enoxaprin 40mg  q24 hours for VTE prophylaxis.   Filed Weights   12/19/24 1805  Weight: 129.7 kg (286 lb)    Body mass index is 47.59 kg/m.  Estimated Creatinine Clearance: 94.4 mL/min (by C-G formula based on SCr of 0.84 mg/dL).   Based on [facility] policy patient is candidate for enoxaparin  0.5mg /kg TBW SQ every 24 hours based on BMI being >30.  DESCRIPTION: Pharmacy has adjusted enoxaparin  dose per [facility] policy.  Patient is now receiving enoxaparin  65 mg every 24 hours    Damien Napoleon, PharmD Clinical Pharmacist  12/20/2024 12:45 AM

## 2024-12-20 NOTE — H&P (Signed)
 "     [place]   PATIENT NAME: Debra [name]    MR#:  [id number]  DATE OF BIRTH:  01/16/1961  DATE OF ADMISSION:  12/19/2024  PRIMARY CARE PHYSICIAN: Debra Cassondra BROCKS, MD   Patient is coming from: Home movement  REQUESTING/REFERRING PHYSICIAN: Suzanne Kirsch, MD  CHIEF COMPLAINT:   Chief Complaint  Patient presents with   Wound Infection    HISTORY OF PRESENT ILLNESS:  Debra [name] is a 64 y.o. female with medical history significant for anxiety, bipolar 1 disorder, C. difficile colitis, coronary artery disease, DDD, depression, hypertension, dyslipidemia, obesity, OSA on CPAP, TIA, seizure disorder, seasonal allergies and type 2 diabetes mellitus, who presented to the emergency room with acute onset of suspected wound infection in her lower abdomen with surrounding erythema, induration, warmth and tenderness without purulent discharge.  She denies any chest pain or palpitations.  No cough or wheezing or dyspnea.  She admitted to fever and mild chills at home.  She has been having nausea without vomiting or diarrhea.  She had abdominal pain at the site of her wound and surrounding it.  ED Course: When she came to the ER, temperature was 100.4/38 and later 103/39.3 with blood pressure of 144/93 and otherwise normal vital signs.  Labs revealed blood glucose 193 with otherwise unremarkable CMP.  Lactic acid was 1.3 and later 1.8.  CBC showed leukocytosis of 14.7 with neutrophilia and hemoconcentration.  Respiratory panel came back negative.  UA showed rare bacteria and 0-5 WBCs with more than 500 glucose EKG as reviewed by me : EKG showed normal sinus rhythm with a rate of 85 with T wave inversion and Q waves inferiorly. Imaging: Portable chest x-ray showed mild central vascular congestion without pulmonary edema.  Abdominal pelvic CT scan with contrast revealed diverticular disease of the left colon without acute inflammatory process, aortic atherosclerosis with no acute intra-abdominal or  pelvic abnormality.  The patient was given 1 L bolus of IV lactated Ringer , IV cefepime  and Flagyl  as well as 1 g of p.o. Tylenol  and 4 mg of IV morphine  sulfate. PAST MEDICAL HISTORY:   Past Medical History:  Diagnosis Date   Anxiety    Aortic atherosclerosis    Arthritis    Bipolar 1 disorder (HCC)    C. difficile colitis    CAD (coronary artery disease)    a.) cCTA 01/10/2021: Ca2+ 171 (95th percentile for age/sex/race match control)   Childhood asthma    DDD (degenerative disc disease), lumbar    a.) s/p LEFT L4-5 foraminotomy/LEFT L5-S1 discectomy 09/2015   Depression    Family history of adverse reaction to anesthesia    a.) intra/post-operative HYPOtension in 1st degree relative (mother)   Head injury, closed, with brief LOC (HCC) 2011ish   Hepatic steatosis    Hyperlipidemia    a.) on PCSK9i (evolumab) for HLD/ASCVD prevention   Hypertension    Infarction of left basal ganglia (HCC)    a.) CT head 10/21/2016: chronic LEFT basal ganglia lacunar infarct.   Insomnia    a.) on trazodone  PRN   Lipoma of neck (LEFT)    Long term current use of aspirin     Myalgia due to statin 03/29/2018   Neuropathy of both feet    Obesity    OSA on CPAP    Parkinson's disease (HCC)    Pneumonia    PTSD (post-traumatic stress disorder)    RBD (REM behavioral disorder)    Seasonal allergies    Seizures (HCC)  TIA (transient ischemic attack) 2005   Tobacco abuse    Type 2 diabetes mellitus treated with insulin  [facility])     PAST SURGICAL HISTORY:   Past Surgical History:  Procedure Laterality Date   ABDOMINAL HYSTERECTOMY  2012   BACK SURGERY     2017   BREAST BIOPSY Right    CESAREAN SECTION  1992   CHOLECYSTECTOMY  2013   LIPOMA EXCISION Left 09/30/2023   Procedure: EXCISION LIPOMA;  Surgeon: Desiderio Schanz, MD;  Location: [facility] ORS;  Service: General;  Laterality: Left;  neck   LUMBAR LAMINECTOMY/DECOMPRESSION MICRODISCECTOMY Left  09/10/2015   Procedure: Left L4-5 Foraminotomy/Left L5-S1 Diskectomy;  Surgeon: Catalina Stains, MD;  Location: [facility];  Service: Neurosurgery;  Laterality: Left;  Left L4-5 Foraminotomy/Left L5-S1 Diskectomy    SOCIAL HISTORY:   Social History   Tobacco Use   Smoking status: Some Days    Current packs/day: 0.50    Average packs/day: 0.5 packs/day for 25.3 years (12.6 ttl pk-yrs)    Types: Cigarettes    Start date: 03/19/2004    Passive exposure: Past   Smokeless tobacco: Never  Substance Use Topics   Alcohol use: Not Currently    Comment: occasional- rare    FAMILY HISTORY:   Family History  Problem Relation Age of Onset   COPD Mother    Bipolar disorder Father    Diabetes Mellitus II Father    Bipolar disorder Brother    Diabetes Mellitus II Brother    Bipolar disorder Brother    Breast cancer Neg Hx     DRUG ALLERGIES:  Allergies[1]  REVIEW OF SYSTEMS:   ROS As per history of present illness. All pertinent systems were reviewed above. Constitutional, HEENT, cardiovascular, respiratory, GI, GU, musculoskeletal, neuro, psychiatric, endocrine, integumentary and hematologic systems were reviewed and are otherwise negative/unremarkable except for positive findings mentioned above in the HPI.   MEDICATIONS AT HOME:   Prior to Admission medications  Medication Sig Start Date End Date Taking? Authorizing Provider  Cholecalciferol  (VITAMIN D -1000 MAX ST) 25 MCG (1000 UT) tablet Take 1,000 Units by mouth daily. 03/01/24  Yes [provider]  ipratropium (ATROVENT ) 0.03 % nasal spray Place 1 spray into both nostrils 2 (two) times daily. 09/14/24  Yes [provider]  lisinopril  (ZESTRIL ) 20 MG tablet Take 20 mg by mouth daily. 10/30/24  Yes [provider]  potassium chloride  SA (KLOR-CON  M) 20 MEQ tablet Take 20 mEq by mouth daily. 10/12/24  Yes [provider]  acetaminophen  (TYLENOL ) 500 MG tablet Take 2 tablets (1,000 mg  total) by mouth every 6 (six) hours as needed for mild pain (pain score 1-3). 09/30/23   Desiderio Schanz, MD  aspirin  EC 81 MG tablet Take 81 mg by mouth daily.    [provider]  benzonatate  (TESSALON ) 100 MG capsule Take 100 mg by mouth 3 (three) times daily as needed.    [provider]  calcium  carbonate (OSCAL) 1500 (600 Ca) MG TABS tablet Take 1 tablet by mouth daily with breakfast.     [provider]  clonazePAM  (KLONOPIN ) 0.5 MG tablet Take 1 tablet (0.5 mg total) by mouth as directed. Take one tablet once a day as needed for severe anxiety 03/10/23 12/15/23  Eappen, Saramma, MD  Continuous Glucose Sensor (DEXCOM G7 SENSOR) MISC USE TO CHECK BLOOD SUGAR DAILY, REPLACE EVERY 10 DAYS    [provider]  Evolocumab  (REPATHA  SURECLICK) 140 MG/ML SOAJ INJECT 140 MG INTO THE SKIN EVERY  14 (FOURTEEN) DAYS. 10/31/24   Gollan, Timothy J, MD  FARXIGA  10 MG TABS tablet TAKE 1 TABLET DAILY (HIGHER DOSE) 07/03/19   Poulose, Elizabeth E, NP  furosemide  (LASIX ) 20 MG tablet Take 20 mg by mouth daily. 06/06/21   [provider]  glucose blood (ONE TOUCH ULTRA TEST) test strip USE UP TO FOUR TIMES A DAY AS DIRECTED; Dx 11.51, LON 99 months 03/20/19   Lada, Newell SQUIBB, MD  HUMALOG  KWIKPEN 100 UNIT/ML KwikPen INJECT 20 UNITS UNDER THE SKIN IN THE MORNING AND 35 UNITS WITH LUNCH AND EVENING MEAL (INJECT WITH MEALS) Patient taking differently: INJECT none UNITS UNDER THE SKIN IN THE MORNING AND 20 UNITS WITH LUNCH AND EVENING MEAL (INJECT WITH MEALS) 05/20/20   Tapia, Leisa, PA-C  Insulin  Pen Needle 29G X 12.7MM MISC 1 each by Does not apply route as needed. 04/03/19   Lavina Damien BRAVO, FNP  JARDIANCE  25 MG TABS tablet Take 25 mg by mouth daily.    [provider]  lamoTRIgine  (LAMICTAL ) 100 MG tablet Take 1 tablet (100 mg total) by mouth 2 (two) times daily. No refills with out an appointment 09/08/24   Eappen, Saramma, MD  mometasone (ELOCON) 0.1 % lotion Apply topically.  09/16/23   [provider]  NON FORMULARY 10 mg at bedtime. Natrol 10 mg    [provider]  nystatin  cream (MYCOSTATIN ) Apply 1 Application topically 2 (two) times daily. 06/25/23   [provider]  PARoxetine  (PAXIL ) 40 MG tablet TAKE ONE TABLET BY MOUTH EVERY MORNING 10/12/24   Eappen, Saramma, MD  pioglitazone  (ACTOS ) 30 MG tablet TAKE 1 TABLET DAILY 02/15/20   Tapia, Leisa, PA-C  potassium chloride  20 MEQ TBCR Take 1 tablet (20 mEq total) by mouth daily. 12/15/23   Gollan, Timothy J, MD  topiramate  (TOPAMAX ) 100 MG tablet Take 100 mg by mouth 2 (two) times daily. 12/08/22   [provider]  TRESIBA  FLEXTOUCH 200 UNIT/ML SOPN Inject 120 Units into the skin at bedtime. Patient taking differently: Inject 140 Units into the skin every morning. In the morning 03/09/19   Lada, Newell SQUIBB, MD      VITAL SIGNS:  Blood pressure (!) 150/63, pulse 79, temperature 98.2 F (36.8 C), resp. rate 20, height 5' 5 (1.651 m), weight 129.7 kg, SpO2 92%.  PHYSICAL EXAMINATION:  Physical Exam  GENERAL:  64 y.o.-year-old obese Caucasian female patient lying in the bed with no acute distress.  EYES: Pupils equal, round, reactive to light and accommodation. No scleral icterus. Extraocular muscles intact.  HEENT: Head atraumatic, normocephalic. Oropharynx and nasopharynx clear.  NECK:  Supple, no jugular venous distention. No thyroid  enlargement, no tenderness.  LUNGS: Normal breath sounds bilaterally, no wheezing, rales,rhonchi or crepitation. No use of accessory muscles of respiration.  CARDIOVASCULAR: Regular rate and rhythm, S1, S2 normal. No murmurs, rubs, or gallops.  ABDOMEN: Soft, nondistended, with tenderness, induration, warmth surrounding left lower abdominal skin tear. Bowel sounds present. No organomegaly or mass.  EXTREMITIES: No pedal edema, cyanosis, or clubbing.  NEUROLOGIC: Cranial nerves II through XII are intact. Muscle strength 5/5 in all extremities. Sensation  intact. Gait not checked.  PSYCHIATRIC: The patient is alert and oriented x 3.  Normal affect and good eye contact. SKIN: As above otherwise no obvious rash, lesion, or ulcer.   LABORATORY PANEL:   CBC Recent Labs  Lab 12/19/24 1815  WBC 14.7*  HGB 15.8*  HCT 47.6*  PLT 286   ------------------------------------------------------------------------------------------------------------------  Chemistries  Recent  Labs  Lab 12/19/24 1815  NA 137  K 4.0  CL 98  CO2 23  GLUCOSE 193*  BUN 17  CREATININE 0.84  CALCIUM  9.8  AST 16  ALT 16  ALKPHOS 107  BILITOT 0.8   ------------------------------------------------------------------------------------------------------------------  Cardiac Enzymes No results for input(s): TROPONINI in the last 168 hours. ------------------------------------------------------------------------------------------------------------------  RADIOLOGY:  CT ABDOMEN PELVIS W CONTRAST Result Date: 12/19/2024 CLINICAL DATA:  Left lower quadrant pain EXAM: CT ABDOMEN AND PELVIS WITH CONTRAST TECHNIQUE: Multidetector CT imaging of the abdomen and pelvis was performed using the standard protocol following bolus administration of intravenous contrast. RADIATION DOSE REDUCTION: This exam was performed according to the departmental dose-optimization program which includes automated exposure control, adjustment of the mA and/or kV according to patient size and/or use of iterative reconstruction technique. CONTRAST:  OMNIPAQUE  IOHEXOL  350 MG/ML SOLN COMPARISON:  CT 02/19/2018 FINDINGS: Lower chest: Lung bases demonstrate atelectasis or scarring at the right base. Hepatobiliary: No focal liver abnormality is seen. Status post cholecystectomy. No biliary dilatation. Pancreas: Unremarkable. No pancreatic ductal dilatation or surrounding inflammatory changes. Spleen: Normal in size without focal abnormality. Adrenals/Urinary Tract: Adrenal glands are normal. Scarring  superior pole right kidney. No hydronephrosis. The bladder is unremarkable Stomach/Bowel: The stomach is nonenlarged. No dilated small bowel. No acute bowel wall thickening. Negative appendix. Diverticular disease of the left colon Vascular/Lymphatic: Aortic atherosclerosis. No enlarged abdominal or pelvic lymph nodes. Reproductive: Status post hysterectomy. No adnexal masses. Other: No ascites or free air Musculoskeletal: No acute or suspicious osseous abnormality. Chronic left pars defect at L5 IMPRESSION: 1. No CT evidence for acute intra-abdominal or pelvic abnormality. 2. Diverticular disease of the left colon without acute inflammatory process. 3. Aortic atherosclerosis. Aortic Atherosclerosis (ICD10-I70.0). Electronically Signed   By: Luke Bun M.D.   On: 12/19/2024 22:36   DG Chest Port 1 View Result Date: 12/19/2024 EXAM: 1 VIEW(S) XRAY OF THE CHEST 12/19/2024 09:31:52 PM COMPARISON: Comparison study 10/10/2020. CLINICAL HISTORY: Questionable sepsis - evaluate for abnormality. FINDINGS: LUNGS AND PLEURA: Mild increased central vascular congestion is noted without significant edema. No focal pulmonary opacity. No pleural effusion. No pneumothorax. HEART AND MEDIASTINUM: No acute abnormality of the cardiac and mediastinal silhouettes. BONES AND SOFT TISSUES: No acute osseous abnormality. IMPRESSION: 1. Mild central vascular congestion without pulmonary edema. Electronically signed by: Oneil Devonshire MD 12/19/2024 09:33 PM EST RP Workstation: [id number]      IMPRESSION AND PLAN:  Assessment and Plan: * Sepsis due to cellulitis (HCC) - This is secondary to abdominal wall cellulitis secondary to infected skin tear. - Sepsis is manifested by fever, tachycardia, tachypnea and leukocytosis. - She will be admitted to a telemetry bed. - Will continue antibiotic therapy with IV vancomycin  and cefepime . - Warm compresses will be utilized. - Will follow blood cultures as well as wound Gram stain culture  and sensitivity.  Type 2 diabetes mellitus without complications (HCC) - The will be placed on supplemental coverage with NovoLog . - Will continue basal coverage. - Will continue Actos  and Jardiance .  Bipolar 1 disorder (HCC) - The patient will be continued on Klonopin  and Lamictal  as well as Topamax . - She will continue Paxil  for her depression.   DVT prophylaxis: Lovenox . Advanced Care Planning:  Code Status: full code. Family Communication:  The plan of care was discussed in details with the patient (and family). I answered all questions. The patient agreed to proceed with the above mentioned plan. Further management will depend upon hospital course. Disposition Plan: Back to previous  home environment Consults called: none. All the records are reviewed and case discussed with ED provider.  Status is: Inpatient  At the time of the admission, it appears that the appropriate admission status for this patient is inpatient.  This is judged to be reasonable and necessary in order to provide the required intensity of service to ensure the patient's safety given the presenting symptoms, physical exam findings and initial radiographic and laboratory data in the context of comorbid conditions.  The patient requires inpatient status due to high intensity of service, high risk of further deterioration and high frequency of surveillance required.  I certify that at the time of admission, it is my clinical judgment that the patient will require inpatient hospital care extending more than 2 midnights.                            Dispo: The patient is from: Home              Anticipated d/c is to: Home              Patient currently is not medically stable to d/c.              Difficult to place patient: No  Madison DELENA Peaches M.D on 12/20/2024 at 7:06 AM  [facility]   From 7 PM-7 AM, contact night-coverage [email or web address]  CC: Primary care physician; Hodges, Jonathon C, MD       [1] Allergies Allergen Reactions   Clindamycin /Lincomycin     Pt states caused C-Diff   Montelukast Hives and Rash    Asthma attacks   Primidone     Diffused erythematous and itchy rash   Sulfasalazine Hives    Other reaction(s): Unknown   Metformin And Related Diarrhea   Benadryl [Diphenhydramine Hcl] Swelling    blistering   Canagliflozin  Other (See Comments)    Other reaction(s): Other (See Comments) Hypotension    Depakote Er [Divalproex Sodium Er] Other (See Comments)    seizures   Dilaudid [Hydromorphone Hcl] Other (See Comments)    seizures   Diphenhydramine-Zinc Acetate Other (See Comments)   Erythromycin    Ethanol    Haloperidol Other (See Comments)   Neosporin [Neomycin-Bacitracin Zn-Polymyx] Swelling    blistering   Sitagliptin Other (See Comments)   Statins Other (See Comments)    Other reaction(s): myalgias   Sulfa Antibiotics Hives    Other reaction(s): Unknown   Victoza [Liraglutide] Nausea And Vomiting and Other (See Comments)    Other reaction(s): Other (See Comments) pancreatitis   "

## 2024-12-20 NOTE — Progress Notes (Signed)
 Pharmacy Antibiotic Note  Debra [name] is a 64 y.o. female admitted on 12/19/2024 with cellulitis.  Pharmacy has been consulted for cefepime  and vancomycin  dosing.  Plan: Vancomycin  load of 2500 mg IV x1 given in ED Start vancomycin  1750 mg IV every 24 hours (eAUC 479.6, Scr 0.84, IBW used, Vd 0.5 L/kg) Start cefepime  2 g IV every 8 hours Continue to monitor renal function, clinical status, culture data, and antibiotic LOT  Height: 5' 5 (165.1 cm) Weight: 129.7 kg (286 lb) IBW/kg (Calculated) : 57  Temp (24hrs), Avg:100.7 F (38.2 C), Min:99.1 F (37.3 C), Max:103 F (39.4 C)  Recent Labs  Lab 12/19/24 1815 12/19/24 2148  WBC 14.7*  --   CREATININE 0.84  --   LATICACIDVEN 1.3 1.8    Estimated Creatinine Clearance: 94.4 mL/min (by C-G formula based on SCr of 0.84 mg/dL).    Allergies[1]  Antimicrobials this admission: vancomycin  1/13 >>  cefepime  1/13 >>   Dose adjustments this admission: N/A  Microbiology results: 01/13 BCx: pending  Thank you for involving pharmacy in this patient's care.   Damien Napoleon, PharmD Clinical Pharmacist 12/20/2024 12:34 AM     [1]  Allergies Allergen Reactions   Clindamycin /Lincomycin     Pt states caused C-Diff   Montelukast Hives and Rash    Asthma attacks   Primidone     Diffused erythematous and itchy rash   Sulfasalazine Hives    Other reaction(s): Unknown   Metformin And Related Diarrhea   Benadryl [Diphenhydramine Hcl] Swelling    blistering   Canagliflozin  Other (See Comments)    Other reaction(s): Other (See Comments) Hypotension    Depakote Er [Divalproex Sodium Er] Other (See Comments)    seizures   Dilaudid [Hydromorphone Hcl] Other (See Comments)    seizures   Diphenhydramine-Zinc Acetate Other (See Comments)   Erythromycin    Ethanol    Haloperidol Other (See Comments)   Neosporin [Neomycin-Bacitracin Zn-Polymyx] Swelling    blistering   Sitagliptin Other (See Comments)   Statins Other (See  Comments)    Other reaction(s): myalgias   Sulfa Antibiotics Hives    Other reaction(s): Unknown   Victoza [Liraglutide] Nausea And Vomiting and Other (See Comments)    Other reaction(s): Other (See Comments) pancreatitis

## 2024-12-20 NOTE — Progress Notes (Signed)
 Pharmacy Antibiotic Note  Debra [name] is a 64 y.o. female admitted on 12/19/2024 with cellulitis.  Pharmacy has been consulted for cefepime  and vancomycin  dosing.  Plan: Change vancomycin  to 2000mg  IV q24H eAUC 523, Cmax 41, Cmin 11 Scr 0.8, IBW, Vd 0.5 L/kg (BMI 48) Continue cefepime  2g IV q8H Continue to monitor renal function, clinical status, culture data, and antibiotic LOT  Height: 5' 5 (165.1 cm) Weight: 129.7 kg (286 lb) IBW/kg (Calculated) : 57  Temp (24hrs), Avg:99.8 F (37.7 C), Min:98.2 F (36.8 C), Max:103 F (39.4 C)  Recent Labs  Lab 12/19/24 1815 12/19/24 2148 12/20/24 0608  WBC 14.7*  --  14.3*  CREATININE 0.84  --  0.73  LATICACIDVEN 1.3 1.8  --     Estimated Creatinine Clearance: 99.1 mL/min (by C-G formula based on SCr of 0.73 mg/dL).    Allergies[1]  Antimicrobials this admission: vancomycin  1/13 >>  cefepime  1/13 >>   Dose adjustments this admission: n/a  Microbiology results: 01/13 BCx: no growth, <24 hrs  Thank you for involving pharmacy in this patient's care.   Will M. Lenon, PharmD, BCPS Clinical Pharmacist 12/20/2024 12:29 PM     [1]  Allergies Allergen Reactions   Clindamycin /Lincomycin     Pt states caused C-Diff   Montelukast Hives and Rash    Asthma attacks   Primidone     Diffused erythematous and itchy rash   Sulfasalazine Hives    Other reaction(s): Unknown   Metformin And Related Diarrhea   Benadryl [Diphenhydramine Hcl] Swelling    blistering   Canagliflozin  Other (See Comments)    Other reaction(s): Other (See Comments) Hypotension    Depakote Er [Divalproex Sodium Er] Other (See Comments)    seizures   Dilaudid [Hydromorphone Hcl] Other (See Comments)    seizures   Diphenhydramine-Zinc Acetate Other (See Comments)   Erythromycin    Ethanol    Haloperidol Other (See Comments)   Neosporin [Neomycin-Bacitracin Zn-Polymyx] Swelling    blistering   Sitagliptin Other (See Comments)   Statins Other (See  Comments)    Other reaction(s): myalgias   Sulfa Antibiotics Hives    Other reaction(s): Unknown   Victoza [Liraglutide] Nausea And Vomiting and Other (See Comments)    Other reaction(s): Other (See Comments) pancreatitis

## 2024-12-20 NOTE — Assessment & Plan Note (Signed)
-   The patient will be continued on Klonopin  and Lamictal  as well as Topamax . - She will continue Paxil  for her depression.

## 2024-12-20 NOTE — ED Notes (Signed)
 NURSE LISA RN INFORMED OF BED ASSIGNED

## 2024-12-20 NOTE — ED Notes (Signed)
 Verbal report given to [employer]. Pt tranported to room 6. Sitting in recliner. Informed of NPO status. Second Vanc started. Pt has call bell within reach. Denies any additional needs.

## 2024-12-20 NOTE — ED Notes (Signed)
 CCMD called to monitor patient

## 2024-12-20 NOTE — Assessment & Plan Note (Addendum)
-   The will be placed on supplemental coverage with NovoLog . - Will continue basal coverage. - Will continue Actos  and Jardiance .

## 2024-12-20 NOTE — Assessment & Plan Note (Addendum)
-   This is secondary to abdominal wall cellulitis secondary to infected skin tear. - Sepsis is manifested by fever, tachycardia, tachypnea and leukocytosis. - She will be admitted to a telemetry bed. - Will continue antibiotic therapy with IV vancomycin  and cefepime . - Warm compresses will be utilized. - Will follow blood cultures as well as wound Gram stain culture and sensitivity.

## 2024-12-20 NOTE — Progress Notes (Signed)
 " Progress Note   Patient: Debra [name] FMW:[id number] DOB: November 24, 1961 DOA: 12/19/2024     0 DOS: the patient was seen and examined on 12/20/2024   Brief hospital course: From HPI Debra [name] is a 64 y.o. female with medical history significant for anxiety, bipolar 1 disorder, C. difficile colitis, coronary artery disease, DDD, depression, hypertension, dyslipidemia, obesity, OSA on CPAP, TIA, seizure disorder, seasonal allergies and type 2 diabetes mellitus, who presented to the emergency room with acute onset of suspected wound infection in her lower abdomen with surrounding erythema, induration, warmth and tenderness without purulent discharge.  She denies any chest pain or palpitations.  No cough or wheezing or dyspnea.  She admitted to fever and mild chills at home.  She has been having nausea without vomiting or diarrhea.  She had abdominal pain at the site of her wound and surrounding it.   ED Course: When she came to the ER, temperature was 100.4/38 and later 103/39.3 with blood pressure of 144/93 and otherwise normal vital signs.  Labs revealed blood glucose 193 with otherwise unremarkable CMP.  Lactic acid was 1.3 and later 1.8.  CBC showed leukocytosis of 14.7 with neutrophilia and hemoconcentration.  Respiratory panel came back negative.  UA showed rare bacteria and 0-5 WBCs with more than 500 glucose EKG as reviewed by me : EKG showed normal sinus rhythm with a rate of 85 with T wave inversion and Q waves inferiorly. Imaging: Portable chest x-ray showed mild central vascular congestion without pulmonary edema.  Abdominal pelvic CT scan with contrast revealed diverticular disease of the left colon without acute inflammatory process, aortic atherosclerosis with no acute intra-abdominal or pelvic abnormality.   The patient was given 1 L bolus of IV lactated Ringer , IV cefepime  and Flagyl  as well as 1 g of p.o. Tylenol  and 4 mg of IV morphine  sulfate.    Assessment and Plan: Sepsis due to  abdominal wall cellulitis (HCC) - This is secondary to abdominal wall cellulitis secondary to infected skin tear. - Sepsis is manifested by fever, tachycardia, tachypnea and leukocytosis. Continue IV antibiotics Follow-up on culture results Wound care consulted   Type 2 diabetes mellitus without complications (HCC) - The will be placed on supplemental coverage with NovoLog . - Will continue basal coverage. - Will continue Actos  and Jardiance .   Bipolar 1 disorder (HCC) - The patient will be continued on Klonopin  and Lamictal  as well as Topamax . - She will continue Paxil  for her depression.     DVT prophylaxis: Lovenox .  Advanced Care Planning:  Code Status: full code.  Family Communication: Not present at bedside Disposition Plan: Back to previous home environment Consults called: none.   Subjective:  Seen and examined at bedside this morning Admits to improvement in abdominal wall pain Denies nausea vomiting chest pain cough  Physical Exam:   GENERAL: Middle-age female obese laying in bed in no acute distress LUNGS: Decreased air entry bilaterally CARDIOVASCULAR: Regular rate and rhythm, S1, S2 normal. No murmurs, rubs, or gallops.  ABDOMEN: Soft, nondistended, with tenderness, induration, warmth surrounding left lower abdominal skin tear. Bowel sounds present. No organomegaly or mass.  EXTREMITIES: No pedal edema, cyanosis, or clubbing.  NEUROLOGIC: Cranial nerves II through XII are intact. Muscle strength 5/5 in all extremities. Sensation intact. Gait not checked.  PSYCHIATRIC: The patient is alert and oriented x 3.  Normal affect and good eye contact. SKIN: As above otherwise no obvious rash, lesion, or ulcer.      Data Reviewed: CT  scan of the abdomen and pelvis did not show any acute pathology Vitals:   12/20/24 0755 12/20/24 1134 12/20/24 1142 12/20/24 1249  BP: 118/62  (!) 134/55 (!) 140/67  Pulse: 88  71 72  Resp: 15  (!) 21 19  Temp: 98.4 F (36.9 C) 99.2  F (37.3 C)  99.2 F (37.3 C)  TempSrc: Oral Oral  Oral  SpO2: 92%  92% 97%  Weight:      Height:           Latest Ref Rng & Units 12/20/2024    6:08 AM 12/19/2024    6:15 PM 09/28/2023    1:15 PM  BMP  Glucose 70 - 99 mg/dL 885  806  858   BUN 8 - 23 mg/dL 15  17  14    Creatinine 0.44 - 1.00 mg/dL 9.26  9.15  9.29   Sodium 135 - 145 mmol/L 136  137  140   Potassium 3.5 - 5.1 mmol/L 3.9  4.0  3.6   Chloride 98 - 111 mmol/L 103  98  110   CO2 22 - 32 mmol/L 20  23  20    Calcium  8.9 - 10.3 mg/dL 8.8  9.8  8.7        Latest Ref Rng & Units 12/20/2024    6:08 AM 12/19/2024    6:15 PM 09/28/2023    1:15 PM  CBC  WBC 4.0 - 10.5 K/uL 14.3  14.7  5.9   Hemoglobin 12.0 - 15.0 g/dL 85.5  84.1  84.6   Hematocrit 36.0 - 46.0 % 43.4  47.6  47.0   Platelets 150 - 400 K/uL 245  286  250       Author: Drue ONEIDA Potter, MD 12/20/2024 4:17 PM  For on call review [email or web address]  "

## 2024-12-20 NOTE — ED Notes (Signed)
 No cardiac monitor available for [facility] at this time. Charge RN informed.

## 2024-12-21 ENCOUNTER — Telehealth: Payer: Self-pay | Admitting: Cardiovascular Disease

## 2024-12-21 DIAGNOSIS — A419 Sepsis, unspecified organism: Secondary | ICD-10-CM | POA: Diagnosis not present

## 2024-12-21 DIAGNOSIS — L0390 Cellulitis, unspecified: Secondary | ICD-10-CM | POA: Diagnosis not present

## 2024-12-21 LAB — CBC WITH DIFFERENTIAL/PLATELET
Abs Immature Granulocytes: 0.05 K/uL (ref 0.00–0.07)
Basophils Absolute: 0.1 K/uL (ref 0.0–0.1)
Basophils Relative: 1 %
Eosinophils Absolute: 0.3 K/uL (ref 0.0–0.5)
Eosinophils Relative: 3 %
HCT: 42.9 % (ref 36.0–46.0)
Hemoglobin: 14.2 g/dL (ref 12.0–15.0)
Immature Granulocytes: 1 %
Lymphocytes Relative: 12 %
Lymphs Abs: 1.2 K/uL (ref 0.7–4.0)
MCH: 32.1 pg (ref 26.0–34.0)
MCHC: 33.1 g/dL (ref 30.0–36.0)
MCV: 97.1 fL (ref 80.0–100.0)
Monocytes Absolute: 1.1 K/uL — ABNORMAL HIGH (ref 0.1–1.0)
Monocytes Relative: 11 %
Neutro Abs: 7.4 K/uL (ref 1.7–7.7)
Neutrophils Relative %: 72 %
Platelets: 255 K/uL (ref 150–400)
RBC: 4.42 MIL/uL (ref 3.87–5.11)
RDW: 13.2 % (ref 11.5–15.5)
WBC: 10.1 K/uL (ref 4.0–10.5)
nRBC: 0 % (ref 0.0–0.2)

## 2024-12-21 LAB — BASIC METABOLIC PANEL WITH GFR
Anion gap: 14 (ref 5–15)
BUN: 19 mg/dL (ref 8–23)
CO2: 19 mmol/L — ABNORMAL LOW (ref 22–32)
Calcium: 9.1 mg/dL (ref 8.9–10.3)
Chloride: 104 mmol/L (ref 98–111)
Creatinine, Ser: 0.73 mg/dL (ref 0.44–1.00)
GFR, Estimated: >60 mL/min
Glucose, Bld: 80 mg/dL (ref 70–99)
Potassium: 3.5 mmol/L (ref 3.5–5.1)
Sodium: 137 mmol/L (ref 135–145)

## 2024-12-21 LAB — GLUCOSE, CAPILLARY
Glucose-Capillary: 77 mg/dL (ref 70–99)
Glucose-Capillary: 139 mg/dL — ABNORMAL HIGH (ref 70–99)
Glucose-Capillary: 174 mg/dL — ABNORMAL HIGH (ref 70–99)
Glucose-Capillary: 167 mg/dL — ABNORMAL HIGH (ref 70–99)
Glucose-Capillary: 138 mg/dL — ABNORMAL HIGH (ref 70–99)

## 2024-12-21 NOTE — Evaluation (Signed)
 Occupational Therapy Evaluation Patient Details Name: Debra [name] MRN: [id number] DOB: 12-Sep-1961 Today's Date: 12/21/2024   History of Present Illness   64 y.o. female with medical history significant for anxiety, bipolar 1 disorder, C. difficile colitis, coronary artery disease, DDD, depression, hypertension, dyslipidemia, obesity, OSA on CPAP, TIA, seizure disorder, seasonal allergies and type 2 diabetes mellitus, who presented to the emergency room with acute onset of suspected wound infection in her lower abdomen with surrounding erythema, induration, warmth and tenderness without purulent discharge.     Clinical Impressions Pt was seen for OT evaluation this date. Pt agreeable, spouse and son in law present. Prior to hospital admission, pt was mod indep with most ADL, IADL, and mobility not using AD. Pt lives with her spouse, daughter, and SIL in a [facility] with level entry and walk-in shower. Denies falls. Pt presents with deficits in activity tolerance limiting their ability to perform ADL management at baseline level. Pt currently requires PRN MIN A for LB ADL tasks, which spouse reports he sometimes provides. Pt completed all aspects of mobility (holding onto IV pole) and toileting with mod indep. Pt endorsing feeling at baseline functional status, VSS. Pt/family educated in AE/DME for LB ADL to improve access and minimize falls risks. Encouraged AE to minimize bending and potential shearing to abdominal panus wound. Educated in benefits of bariatric BSC over commode to improve toilet transfers and access for pericare. Pt/family verbalized understanding. Do not anticipate the need for additional follow up OT services upon acute hospital DC. Will sign off.    If plan is discharge home, recommend the following:   Assistance with cooking/housework;Assist for transportation     Functional Status Assessment   Patient has not had a recent decline in their functional status     Equipment  Recommendations   BSC/3in1;Other (comment) (bariatric BSC)     Recommendations for Other Services         Precautions/Restrictions   Precautions Precautions: None Recall of Precautions/Restrictions: Intact Restrictions Weight Bearing Restrictions Per Provider Order: No     Mobility Bed Mobility Overal bed mobility: Modified Independent             General bed mobility comments: incr effort    Transfers Overall transfer level: Modified independent Equipment used: None               General transfer comment: supervision for safety initial STS, mod indep from reg height toilet for 2nd STS      Balance Overall balance assessment: No apparent balance deficits (not formally assessed)                                         ADL either performed or assessed with clinical judgement   ADL Overall ADL's : Modified independent;At baseline                                       General ADL Comments: Pt completed all aspects of toileting with mod indep. Family able to provide assist as needed upon discharge.     Vision         Perception         Praxis         Pertinent Vitals/Pain Pain Assessment Pain Assessment: No/denies pain     Extremity/Trunk Assessment Upper  Extremity Assessment Upper Extremity Assessment: Overall WFL for tasks assessed   Lower Extremity Assessment Lower Extremity Assessment: Overall WFL for tasks assessed   Cervical / Trunk Assessment Cervical / Trunk Assessment: Normal   Communication     Cognition Arousal: Alert Behavior During Therapy: WFL for tasks assessed/performed Cognition: No apparent impairments                               Following commands: Intact       Cueing  General Comments   Cueing Techniques: Verbal cues      Exercises Other Exercises Other Exercises: Pt/family educated in AE/DME for LB ADL to improve access and minimize falls risks.  Encouraged AE to minimize bending and potential shearing to abdominal panus wound.   Shoulder Instructions      Home Living Family/patient expects to be discharged to:: Private residence Living Arrangements: Spouse/significant other;Children Available Help at Discharge: Family;Available 24 hours/day;Available PRN/intermittently Type of Home: House Home Access: Level entry     Home Layout: One level     Bathroom Shower/Tub: Producer, Television/film/video: Handicapped height     Home Equipment: Educational Psychologist (4 wheels);Electric scooter;Grab bars - tub/shower;Hand held shower head;Adaptive equipment Adaptive Equipment: Reacher        Prior Functioning/Environment Prior Level of Function : Needs assist;Driving;History of Falls (last six months);Independent/Modified Independent             Mobility Comments: no AD ADLs Comments: PRN assist for LB dressing, pericare    OT Problem List: Decreased activity tolerance;Obesity   OT Treatment/Interventions:        OT Goals(Current goals can be found in the care plan section)   Acute Rehab OT Goals Patient Stated Goal: go home after antibiotics OT Goal Formulation: All assessment and education complete, DC therapy   OT Frequency:       Co-evaluation              AM-PAC OT 6 Clicks Daily Activity     Outcome Measure Help from another person eating meals?: None Help from another person taking care of personal grooming?: None Help from another person toileting, which includes using toliet, bedpan, or urinal?: None Help from another person bathing (including washing, rinsing, drying)?: None Help from another person to put on and taking off regular upper body clothing?: None Help from another person to put on and taking off regular lower body clothing?: None 6 Click Score: 24   End of Session Nurse Communication: Mobility status  Activity Tolerance: Patient tolerated treatment well Patient left: in  bed;with call bell/phone within reach;with family/visitor present  OT Visit Diagnosis: Other abnormalities of gait and mobility (R26.89)                Time: 1422-1440 OT Time Calculation (min): 18 min Charges:  OT General Charges $OT Visit: 1 Visit OT Evaluation $OT Eval Low Complexity: 1 Low OT Treatments $Self Care/Home Management : 8-22 mins  Warren SAUNDERS., MPH, MS, OTR/L ascom [phone] 12/21/24, 2:51 PM

## 2024-12-21 NOTE — Progress Notes (Signed)
 " Progress Note   Patient: Debra [name] FMW:[id number] DOB: Jan 26, 1961 DOA: 12/19/2024     1 DOS: the patient was seen and examined on 12/21/2024     Brief hospital course: From HPI Debra [name] is a 64 y.o. female with medical history significant for anxiety, bipolar 1 disorder, C. difficile colitis, coronary artery disease, DDD, depression, hypertension, dyslipidemia, obesity, OSA on CPAP, TIA, seizure disorder, seasonal allergies and type 2 diabetes mellitus, who presented to the emergency room with acute onset of suspected wound infection in her lower abdomen with surrounding erythema, induration, warmth and tenderness without purulent discharge.  She denies any chest pain or palpitations.  No cough or wheezing or dyspnea.  She admitted to fever and mild chills at home.  She has been having nausea without vomiting or diarrhea.  She had abdominal pain at the site of her wound and surrounding it.   ED Course: When she came to the ER, temperature was 100.4/38 and later 103/39.3 with blood pressure of 144/93 and otherwise normal vital signs.  Labs revealed blood glucose 193 with otherwise unremarkable CMP.  Lactic acid was 1.3 and later 1.8.  CBC showed leukocytosis of 14.7 with neutrophilia and hemoconcentration.  Respiratory panel came back negative.  UA showed rare bacteria and 0-5 WBCs with more than 500 glucose EKG as reviewed by me : EKG showed normal sinus rhythm with a rate of 85 with T wave inversion and Q waves inferiorly. Imaging: Portable chest x-ray showed mild central vascular congestion without pulmonary edema.  Abdominal pelvic CT scan with contrast revealed diverticular disease of the left colon without acute inflammatory process, aortic atherosclerosis with no acute intra-abdominal or pelvic abnormality.   The patient was given 1 L bolus of IV lactated Ringer , IV cefepime  and Flagyl  as well as 1 g of p.o. Tylenol  and 4 mg of IV morphine  sulfate.     Assessment and Plan: Sepsis due to  abdominal wall cellulitis (HCC) - This is secondary to abdominal wall cellulitis secondary to infected skin tear. - Sepsis is manifested by fever, tachycardia, tachypnea and leukocytosis. Continue IV antibiotics Follow-up on culture results Wound care on board and we appreciate input   Type 2 diabetes mellitus without complications (HCC) - The will be placed on supplemental coverage with NovoLog . - Will continue basal coverage. - Will continue Actos  and Jardiance .   Bipolar 1 disorder (HCC) - The patient will be continued on Klonopin  and Lamictal  as well as Topamax . - She will continue Paxil  for her depression.     DVT prophylaxis: Lovenox .   Advanced Care Planning:  Code Status: full code.   Family Communication: Not present at bedside Disposition Plan: Back to previous home environment Consults called: none.     Subjective:  Patient admits to significant improvement Wound culture pending Denies nausea vomiting abdominal pain chest pain cough  Physical Exam:    GENERAL: Middle-age female obese laying in bed in no acute distress LUNGS: Decreased air entry bilaterally CARDIOVASCULAR: Regular rate and rhythm, S1, S2 normal. No murmurs, rubs, or gallops.  ABDOMEN: Soft, nondistended, with tenderness, induration, warmth surrounding left lower abdominal skin tear. Bowel sounds present. No organomegaly or mass.  EXTREMITIES: No pedal edema, cyanosis, or clubbing.  NEUROLOGIC: Cranial nerves II through XII are intact. Muscle strength 5/5 in all extremities. Sensation intact. Gait not checked.  PSYCHIATRIC: The patient is alert and oriented x 3.  Normal affect and good eye contact. SKIN: As above otherwise no obvious rash, lesion, or  ulcer.        Data Reviewed:    Latest Ref Rng & Units 12/21/2024    5:53 AM 12/20/2024    6:08 AM 12/19/2024    6:15 PM  CBC  WBC 4.0 - 10.5 K/uL 10.1  14.3  14.7   Hemoglobin 12.0 - 15.0 g/dL 85.7  85.5  84.1   Hematocrit 36.0 - 46.0 % 42.9   43.4  47.6   Platelets 150 - 400 K/uL 255  245  286        Latest Ref Rng & Units 12/21/2024    5:53 AM 12/20/2024    6:08 AM 12/19/2024    6:15 PM  BMP  Glucose 70 - 99 mg/dL 80  885  806   BUN 8 - 23 mg/dL 19  15  17    Creatinine 0.44 - 1.00 mg/dL 9.26  9.26  9.15   Sodium 135 - 145 mmol/L 137  136  137   Potassium 3.5 - 5.1 mmol/L 3.5  3.9  4.0   Chloride 98 - 111 mmol/L 104  103  98   CO2 22 - 32 mmol/L 19  20  23    Calcium  8.9 - 10.3 mg/dL 9.1  8.8  9.8      Vitals:   12/21/24 0808 12/21/24 0950 12/21/24 0956 12/21/24 1526  BP: (!) 119/53 (!) 86/28 (!) 134/56 (!) 122/59  Pulse: 72 73 72 71  Resp: 15  20 16   Temp: 98.2 F (36.8 C)   98.1 F (36.7 C)  TempSrc:      SpO2: 96% 93% 95% 95%  Weight:      Height:         Author: Drue ONEIDA Potter, MD 12/21/2024 5:49 PM  For on call review [email or web address]  "

## 2024-12-21 NOTE — Progress Notes (Signed)
 PT Cancellation Note  Patient Details Name: Debra [name] MRN: [id number] DOB: [birth year]   Cancelled Treatment:    Reason Eval/Treat Not Completed: PT screened, no needs identified, will sign off. Per OT pt reported that she is currently at her baseline level of function and declined the need for PT services.  Will complete PT orders at this time but will reassess pt pending a change in status upon receipt of new PT orders.   CHARM Glendia Bertin PT, DPT 12/21/24, 2:46 PM

## 2024-12-21 NOTE — Telephone Encounter (Signed)
 Pt daughter calling to make the provider aware that pt has been admitted to the hospital  Please advise

## 2024-12-21 NOTE — Consult Note (Signed)
 WOC Nurse Consult Note: Reason for Consult:Nonhealing wound to abdominal pannus, left side.  Intertriginous dermatitis to abdominal skin fold.  Culture obtained for aerobic and anaerobic microorganisms per MD order (blue top swab and red top swab) and sent to lab  Wound type: Intertriginous breakdown, with injury to left pannus consistent with shearing. Large pendulous abdomen and patient endorses having to use effort for transfers that could result in shearing.  SKin fold from abdomen with erythema and creamy drainage with musty odor.  Pressure Injury POA: NA Measurement: Left pannus:  3 cm x 4.5 cm lesion. Peeling epithelium with black center and copious purulence from central aspect  Erythema along entire length of abdominal pannus. Creamy effluent and musty odor Wound bed: see above Drainage (amount, consistency, odor) see above Periwound: Peeling epithelium to pannus wound. Obese abdomen that overlaps upper thighs Dressing procedure/placement/frequency: Wound is cleansed with VASHE and allowed to air dry.  Fresh wound effluent is obtained for cultures.  Aquacel silver hydrofiber applied to abdominal wound.  Covered with foam dressing.  Need Interdry skin fold management to abdominal pannus to manage moisture. Order Gerlean # [id number] Measure and cut length of InterDry to fit in skin folds that have skin breakdown Tuck InterDry fabric into skin folds in a single layer, allow for 2 inches of overhang from skin edges to allow for wicking to occur May remove to bathe; dry area thoroughly and then tuck into affected areas again Do not apply any creams or ointments when using InterDry DO NOT THROW AWAY FOR 5 DAYS unless soiled with stool DO NOT [facility] product, this will inactivate the silver in the material  New sheet of Interdry should be applied after 5 days of use if patient continues to have skin breakdown   Will not follow at this time.  Please re-consult if needed.  Darice Cooley MSN, RN, FNP-BC  [facility] Wound, Ostomy, Continence Nurse Outpatient [facility] [phone] Work cell phone:  [phone]

## 2024-12-22 ENCOUNTER — Other Ambulatory Visit: Payer: Self-pay

## 2024-12-22 DIAGNOSIS — L0390 Cellulitis, unspecified: Secondary | ICD-10-CM | POA: Diagnosis not present

## 2024-12-22 DIAGNOSIS — A419 Sepsis, unspecified organism: Secondary | ICD-10-CM | POA: Diagnosis not present

## 2024-12-22 LAB — CBC WITH DIFFERENTIAL/PLATELET
Abs Immature Granulocytes: 0.04 K/uL (ref 0.00–0.07)
Basophils Absolute: 0.1 K/uL (ref 0.0–0.1)
Basophils Relative: 1 %
Eosinophils Absolute: 0.4 K/uL (ref 0.0–0.5)
Eosinophils Relative: 5 %
HCT: 43.6 % (ref 36.0–46.0)
Hemoglobin: 14.4 g/dL (ref 12.0–15.0)
Immature Granulocytes: 1 %
Lymphocytes Relative: 14 %
Lymphs Abs: 1 K/uL (ref 0.7–4.0)
MCH: 31.9 pg (ref 26.0–34.0)
MCHC: 33 g/dL (ref 30.0–36.0)
MCV: 96.5 fL (ref 80.0–100.0)
Monocytes Absolute: 1.1 K/uL — ABNORMAL HIGH (ref 0.1–1.0)
Monocytes Relative: 16 %
Neutro Abs: 4.5 K/uL (ref 1.7–7.7)
Neutrophils Relative %: 63 %
Platelets: 261 K/uL (ref 150–400)
RBC: 4.52 MIL/uL (ref 3.87–5.11)
RDW: 13 % (ref 11.5–15.5)
WBC: 7.1 K/uL (ref 4.0–10.5)
nRBC: 0 % (ref 0.0–0.2)

## 2024-12-22 LAB — BASIC METABOLIC PANEL WITH GFR
Anion gap: 11 (ref 5–15)
BUN: 20 mg/dL (ref 8–23)
CO2: 20 mmol/L — ABNORMAL LOW (ref 22–32)
Calcium: 8.8 mg/dL — ABNORMAL LOW (ref 8.9–10.3)
Chloride: 106 mmol/L (ref 98–111)
Creatinine, Ser: 0.79 mg/dL (ref 0.44–1.00)
GFR, Estimated: >60 mL/min
Glucose, Bld: 87 mg/dL (ref 70–99)
Potassium: 3.9 mmol/L (ref 3.5–5.1)
Sodium: 136 mmol/L (ref 135–145)

## 2024-12-22 LAB — GLUCOSE, CAPILLARY
Glucose-Capillary: 99 mg/dL (ref 70–99)
Glucose-Capillary: 240 mg/dL — ABNORMAL HIGH (ref 70–99)

## 2024-12-22 MED ORDER — AMOXICILLIN-POT CLAVULANATE 875-125 MG PO TABS
1.0000 | ORAL_TABLET | Freq: Two times a day (BID) | ORAL | 0 refills | Status: AC
  Filled 2024-12-22: qty 14, 7d supply, fill #0

## 2024-12-22 MED ORDER — CIPROFLOXACIN HCL 500 MG PO TABS
500.0000 mg | ORAL_TABLET | Freq: Two times a day (BID) | ORAL | 0 refills | Status: AC
  Filled 2024-12-22: qty 10, 5d supply, fill #0

## 2024-12-22 NOTE — Plan of Care (Signed)
  Problem: Fluid Volume: Goal: Hemodynamic stability will improve Outcome: Adequate for Discharge   Problem: Clinical Measurements: Goal: Diagnostic test results will improve Outcome: Adequate for Discharge Goal: Signs and symptoms of infection will decrease Outcome: Adequate for Discharge   Problem: Respiratory: Goal: Ability to maintain adequate ventilation will improve Outcome: Adequate for Discharge   Problem: Education: Goal: Ability to describe self-care measures that may prevent or decrease complications (Diabetes Survival Skills Education) will improve Outcome: Adequate for Discharge Goal: Individualized Educational Video(s) Outcome: Adequate for Discharge   Problem: Coping: Goal: Ability to adjust to condition or change in health will improve Outcome: Adequate for Discharge   Problem: Fluid Volume: Goal: Ability to maintain a balanced intake and output will improve Outcome: Adequate for Discharge   Problem: Health Behavior/Discharge Planning: Goal: Ability to identify and utilize available resources and services will improve Outcome: Adequate for Discharge Goal: Ability to manage health-related needs will improve Outcome: Adequate for Discharge   Problem: Metabolic: Goal: Ability to maintain appropriate glucose levels will improve Outcome: Adequate for Discharge   Problem: Nutritional: Goal: Maintenance of adequate nutrition will improve Outcome: Adequate for Discharge Goal: Progress toward achieving an optimal weight will improve Outcome: Adequate for Discharge   Problem: Skin Integrity: Goal: Risk for impaired skin integrity will decrease Outcome: Adequate for Discharge   Problem: Tissue Perfusion: Goal: Adequacy of tissue perfusion will improve Outcome: Adequate for Discharge   Problem: Education: Goal: Knowledge of General Education information will improve Description: Including pain rating scale, medication(s)/side effects and non-pharmacologic  comfort measures Outcome: Adequate for Discharge   Problem: Health Behavior/Discharge Planning: Goal: Ability to manage health-related needs will improve Outcome: Adequate for Discharge   Problem: Clinical Measurements: Goal: Ability to maintain clinical measurements within normal limits will improve Outcome: Adequate for Discharge Goal: Will remain free from infection Outcome: Adequate for Discharge Goal: Diagnostic test results will improve Outcome: Adequate for Discharge Goal: Respiratory complications will improve Outcome: Adequate for Discharge Goal: Cardiovascular complication will be avoided Outcome: Adequate for Discharge   Problem: Activity: Goal: Risk for activity intolerance will decrease Outcome: Adequate for Discharge   Problem: Nutrition: Goal: Adequate nutrition will be maintained Outcome: Adequate for Discharge   Problem: Coping: Goal: Level of anxiety will decrease Outcome: Adequate for Discharge   Problem: Elimination: Goal: Will not experience complications related to bowel motility Outcome: Adequate for Discharge Goal: Will not experience complications related to urinary retention Outcome: Adequate for Discharge   Problem: Pain Managment: Goal: General experience of comfort will improve and/or be controlled Outcome: Adequate for Discharge   Problem: Safety: Goal: Ability to remain free from injury will improve Outcome: Adequate for Discharge   Problem: Skin Integrity: Goal: Risk for impaired skin integrity will decrease Outcome: Adequate for Discharge

## 2024-12-22 NOTE — Discharge Summary (Signed)
 " Physician Discharge Summary   Patient: Debra [name] MRN: [id number] DOB: 1961/06/02  Admit date:     12/19/2024  Discharge date: 12/22/24  Discharge Physician: Drue ONEIDA Potter   PCP: Trinidad Cassondra BROCKS, MD   Recommendations at discharge:  Follow-up with PCP   Discharge Diagnoses: Principal Problem:   Sepsis due to cellulitis [facility]) Active Problems:   Bipolar 1 disorder (HCC)   Type 2 diabetes mellitus without complications (HCC)  Resolved Problems:   * No resolved hospital problems. *  Hospital Course:  From HPI Debra [name] is a 64 y.o. female with medical history significant for anxiety, bipolar 1 disorder, C. difficile colitis, coronary artery disease, DDD, depression, hypertension, dyslipidemia, obesity, OSA on CPAP, TIA, seizure disorder, seasonal allergies and type 2 diabetes mellitus, who presented to the emergency room with acute onset of suspected wound infection in her lower abdomen with surrounding erythema, induration, warmth and tenderness without purulent discharge.  She denies any chest pain or palpitations.  No cough or wheezing or dyspnea.  She admitted to fever and mild chills at home.  She has been having nausea without vomiting or diarrhea.  She had abdominal pain at the site of her wound and surrounding it.   ED Course: When she came to the ER, temperature was 100.4/38 and later 103/39.3 with blood pressure of 144/93 and otherwise normal vital signs.  Labs revealed blood glucose 193 with otherwise unremarkable CMP.  Lactic acid was 1.3 and later 1.8.  CBC showed leukocytosis of 14.7 with neutrophilia and hemoconcentration.  Respiratory panel came back negative.  UA showed rare bacteria and 0-5 WBCs with more than 500 glucose EKG as reviewed by me : EKG showed normal sinus rhythm with a rate of 85 with T wave inversion and Q waves inferiorly. Imaging: Portable chest x-ray showed mild central vascular congestion without pulmonary edema.  Abdominal pelvic CT scan with  contrast revealed diverticular disease of the left colon without acute inflammatory process, aortic atherosclerosis with no acute intra-abdominal or pelvic abnormality.   The patient was given 1 L bolus of IV lactated Ringer , IV cefepime  and Flagyl  as well as 1 g of p.o. Tylenol  and 4 mg of IV morphine  sulfate.    Other hospital course as noted below   Assessment and Plan: Sepsis due to abdominal wall cellulitis (HCC) - This is secondary to abdominal wall cellulitis secondary to infected skin tear. - Sepsis is manifested by fever, tachycardia, tachypnea and leukocytosis. Patient looks much better today and therefore being discharged home Wound culture result growing gram-negative Blood culture showing no growth at day 3 Wound care on board and we appreciate input   Type 2 diabetes mellitus without complications (HCC) - Continue home medication   Bipolar 1 disorder (HCC) - The patient will be continued on Klonopin  and Lamictal  as well as Topamax . - She will continue Paxil  for her depression.   Consultants: None Procedures performed: Wound care consult Disposition: Home Diet recommendation:  Cardiac and Carb modified diet DISCHARGE MEDICATION: Allergies as of 12/22/2024       Reactions   Clindamycin /lincomycin    Pt states caused C-Diff   Montelukast Hives, Rash   Asthma attacks   Primidone    Diffused erythematous and itchy rash   Sulfasalazine Hives   Other reaction(s): Unknown   Metformin And Related Diarrhea   Benadryl [diphenhydramine Hcl] Swelling   blistering   Canagliflozin  Other (See Comments)   Other reaction(s): Other (See Comments) Hypotension   Depakote  Er [divalproex Sodium Er] Other (See Comments)   seizures   Dilaudid [hydromorphone Hcl] Other (See Comments)   seizures   Diphenhydramine-zinc Acetate Other (See Comments)   Erythromycin    Ethanol    Haloperidol Other (See Comments)   Neosporin [neomycin-bacitracin Zn-polymyx] Swelling   blistering    Sitagliptin Other (See Comments)   Statins Other (See Comments)   Other reaction(s): myalgias   Sulfa Antibiotics Hives   Other reaction(s): Unknown   Victoza [liraglutide] Nausea And Vomiting, Other (See Comments)   Other reaction(s): Other (See Comments) pancreatitis        Medication List     STOP taking these medications    benzonatate  100 MG capsule Commonly known as: TESSALON    Farxiga  10 MG Tabs tablet Generic drug: dapagliflozin  propanediol   potassium chloride  SA 20 MEQ tablet Commonly known as: KLOR-CON  M       TAKE these medications    acetaminophen  500 MG tablet Commonly known as: TYLENOL  Take 2 tablets (1,000 mg total) by mouth every 6 (six) hours as needed for mild pain (pain score 1-3).   amoxicillin -clavulanate 875-125 MG tablet Commonly known as: AUGMENTIN  Take 1 tablet by mouth 2 (two) times daily for 7 days.   aspirin  EC 81 MG tablet Take 81 mg by mouth daily.   calcium  carbonate 1500 (600 Ca) MG Tabs tablet Commonly known as: OSCAL Take 1 tablet by mouth daily with breakfast.   ciprofloxacin  500 MG tablet Commonly known as: CIPRO  Take 1 tablet (500 mg total) by mouth 2 (two) times daily for 5 days.   clonazePAM  0.5 MG tablet Commonly known as: KlonoPIN  Take 1 tablet (0.5 mg total) by mouth as directed. Take one tablet once a day as needed for severe anxiety   Dexcom G7 Sensor Misc USE TO CHECK BLOOD SUGAR DAILY, REPLACE EVERY 10 DAYS   furosemide  20 MG tablet Commonly known as: LASIX  Take 20 mg by mouth daily.   glucose blood test strip Commonly known as: ONE TOUCH ULTRA TEST USE UP TO FOUR TIMES A DAY AS DIRECTED; Dx 11.51, LON 99 months   HumaLOG  KwikPen 100 UNIT/ML KwikPen Generic drug: insulin  lispro INJECT 20 UNITS UNDER THE SKIN IN THE MORNING AND 35 UNITS WITH LUNCH AND EVENING MEAL (INJECT WITH MEALS) What changed: additional instructions   Insulin  Pen Needle 29G X 12.7MM Misc 1 each by Does not apply route as  needed.   ipratropium 0.03 % nasal spray Commonly known as: ATROVENT  Place 1 spray into both nostrils 2 (two) times daily.   Jardiance  25 MG Tabs tablet Generic drug: empagliflozin  Take 25 mg by mouth daily.   lamoTRIgine  100 MG tablet Commonly known as: LAMICTAL  Take 1 tablet (100 mg total) by mouth 2 (two) times daily. No refills with out an appointment   lisinopril  20 MG tablet Commonly known as: ZESTRIL  Take 20 mg by mouth daily.   mometasone 0.1 % lotion Commonly known as: ELOCON Apply topically.   NON FORMULARY 10 mg at bedtime. Natrol 10 mg   nystatin  cream Commonly known as: MYCOSTATIN  Apply 1 Application topically 2 (two) times daily.   PARoxetine  40 MG tablet Commonly known as: PAXIL  TAKE ONE TABLET BY MOUTH EVERY MORNING   pioglitazone  30 MG tablet Commonly known as: ACTOS  TAKE 1 TABLET DAILY   Potassium Chloride  ER 20 MEQ Tbcr Take 1 tablet (20 mEq total) by mouth daily.   Repatha  SureClick 140 MG/ML Soaj Generic drug: Evolocumab  INJECT 140 MG INTO THE SKIN EVERY  14 (FOURTEEN) DAYS.   topiramate  100 MG tablet Commonly known as: TOPAMAX  Take 100 mg by mouth 2 (two) times daily.   Tresiba  FlexTouch 200 UNIT/ML FlexTouch Pen Generic drug: insulin  degludec Inject 120 Units into the skin at bedtime. What changed:  how much to take when to take this additional instructions   Vitamin D -1000 Max St 25 MCG (1000 UT) tablet Generic drug: Cholecalciferol  Take 1,000 Units by mouth daily.               Durable Medical Equipment  (From admission, onward)           Start     Ordered   12/21/24 1507  For home use only DME Bedside commode  Once       Comments: bariatric BSC  Question:  Patient needs a bedside commode to treat with the following condition  Answer:  Ambulatory dysfunction   12/21/24 1506            Contact information for after-discharge care     Home Medical Care     [facility]) .    Service: Home Health Services Contact information: [address] [place]  [address] [phone]                    Discharge Exam: Fredricka Weights   12/19/24 1805  Weight: 129.7 kg   GENERAL: Middle-age female obese laying in bed in no acute distress LUNGS: Decreased air entry bilaterally CARDIOVASCULAR: Regular rate and rhythm, S1, S2 normal. No murmurs, rubs, or gallops.  ABDOMEN: Obese but nontender, dressing clean and dry EXTREMITIES: No pedal edema, cyanosis, or clubbing.  NEUROLOGIC: Cranial nerves II through XII are intact. Muscle strength 5/5 in all extremities. Sensation intact. Gait not checked.  PSYCHIATRIC: The patient is alert and oriented x 3.  Normal affect and good eye contact. SKIN: As above otherwise no obvious rash, lesion, or ulcer.   Condition at discharge: good  The results of significant diagnostics from this hospitalization (including imaging, microbiology, ancillary and laboratory) are listed below for reference.   Imaging Studies: CT ABDOMEN PELVIS W CONTRAST Result Date: 12/19/2024 CLINICAL DATA:  Left lower quadrant pain EXAM: CT ABDOMEN AND PELVIS WITH CONTRAST TECHNIQUE: Multidetector CT imaging of the abdomen and pelvis was performed using the standard protocol following bolus administration of intravenous contrast. RADIATION DOSE REDUCTION: This exam was performed according to the departmental dose-optimization program which includes automated exposure control, adjustment of the mA and/or kV according to patient size and/or use of iterative reconstruction technique. CONTRAST:  OMNIPAQUE  IOHEXOL  350 MG/ML SOLN COMPARISON:  CT 02/19/2018 FINDINGS: Lower chest: Lung bases demonstrate atelectasis or scarring at the right base. Hepatobiliary: No focal liver abnormality is seen. Status post cholecystectomy. No biliary dilatation. Pancreas: Unremarkable. No pancreatic ductal dilatation or surrounding inflammatory changes.  Spleen: Normal in size without focal abnormality. Adrenals/Urinary Tract: Adrenal glands are normal. Scarring superior pole right kidney. No hydronephrosis. The bladder is unremarkable Stomach/Bowel: The stomach is nonenlarged. No dilated small bowel. No acute bowel wall thickening. Negative appendix. Diverticular disease of the left colon Vascular/Lymphatic: Aortic atherosclerosis. No enlarged abdominal or pelvic lymph nodes. Reproductive: Status post hysterectomy. No adnexal masses. Other: No ascites or free air Musculoskeletal: No acute or suspicious osseous abnormality. Chronic left pars defect at L5 IMPRESSION: 1. No CT evidence for acute intra-abdominal or pelvic abnormality. 2. Diverticular disease of the left colon without acute inflammatory process. 3. Aortic  atherosclerosis. Aortic Atherosclerosis (ICD10-I70.0). Electronically Signed   By: Luke Bun M.D.   On: 12/19/2024 22:36   DG Chest Port 1 View Result Date: 12/19/2024 EXAM: 1 VIEW(S) XRAY OF THE CHEST 12/19/2024 09:31:52 PM COMPARISON: Comparison study 10/10/2020. CLINICAL HISTORY: Questionable sepsis - evaluate for abnormality. FINDINGS: LUNGS AND PLEURA: Mild increased central vascular congestion is noted without significant edema. No focal pulmonary opacity. No pleural effusion. No pneumothorax. HEART AND MEDIASTINUM: No acute abnormality of the cardiac and mediastinal silhouettes. BONES AND SOFT TISSUES: No acute osseous abnormality. IMPRESSION: 1. Mild central vascular congestion without pulmonary edema. Electronically signed by: Oneil Devonshire MD 12/19/2024 09:33 PM EST RP Workstation: [id number]    Microbiology: Results for orders placed or performed during the hospital encounter of 12/19/24  Blood Culture (routine x 2)     Status: None (Preliminary result)   Collection Time: 12/19/24  6:15 PM   Specimen: BLOOD  Result Value Ref Range Status   Specimen Description BLOOD RIGHT ANTECUBITAL  Final   Special Requests   Final     BOTTLES DRAWN AEROBIC AND ANAEROBIC Blood Culture results may not be optimal due to an inadequate volume of blood received in culture bottles   Culture   Final    NO GROWTH 3 DAYS Performed at [facility], [address] [place], [place] [address]    Report Status PENDING  Incomplete  Resp panel by RT-PCR (RSV, Flu A&B, Covid) Anterior Nasal Swab     Status: None   Collection Time: 12/19/24  6:17 PM   Specimen: Anterior Nasal Swab  Result Value Ref Range Status   SARS Coronavirus 2 by RT PCR NEGATIVE NEGATIVE Final    Comment: (NOTE) SARS-CoV-2 target nucleic acids are NOT DETECTED.  The SARS-CoV-2 RNA is generally detectable in upper respiratory specimens during the acute phase of infection. The lowest concentration of SARS-CoV-2 viral copies this assay can detect is 138 copies/mL. A negative result does not preclude SARS-Cov-2 infection and should not be used as the sole basis for treatment or other patient management decisions. A negative result may occur with  improper specimen collection/handling, submission of specimen other than nasopharyngeal swab, presence of viral mutation(s) within the areas targeted by this assay, and inadequate number of viral copies(<138 copies/mL). A negative result must be combined with clinical observations, patient history, and epidemiological information. The expected result is Negative.  Fact Sheet for Patients:  bloggercourse.com  Fact Sheet for Healthcare Providers:  seriousbroker.it  This test is no t yet approved or cleared by the [place]  FDA and  has been authorized for detection and/or diagnosis of SARS-CoV-2 by FDA under an Emergency Use Authorization (EUA). This EUA will remain  in effect (meaning this test can be used) for the duration of the COVID-19 declaration under Section 564(b)(1) of the Act, 21 U.S.C.section 360bbb-3(b)(1), unless the authorization is  terminated  or revoked sooner.       Influenza A by PCR NEGATIVE NEGATIVE Final   Influenza B by PCR NEGATIVE NEGATIVE Final    Comment: (NOTE) The Xpert Xpress SARS-CoV-2/FLU/RSV plus assay is intended as an aid in the diagnosis of influenza from Nasopharyngeal swab specimens and should not be used as a sole basis for treatment. Nasal washings and aspirates are unacceptable for Xpert Xpress SARS-CoV-2/FLU/RSV testing.  Fact Sheet for Patients: bloggercourse.com  Fact Sheet for Healthcare Providers: seriousbroker.it  This test is not yet approved or cleared by the [place]  FDA and has been authorized for detection and/or  diagnosis of SARS-CoV-2 by FDA under an Emergency Use Authorization (EUA). This EUA will remain in effect (meaning this test can be used) for the duration of the COVID-19 declaration under Section 564(b)(1) of the Act, 21 U.S.C. section 360bbb-3(b)(1), unless the authorization is terminated or revoked.     Resp Syncytial Virus by PCR NEGATIVE NEGATIVE Final    Comment: (NOTE) Fact Sheet for Patients: bloggercourse.com  Fact Sheet for Healthcare Providers: seriousbroker.it  This test is not yet approved or cleared by the [place]  FDA and has been authorized for detection and/or diagnosis of SARS-CoV-2 by FDA under an Emergency Use Authorization (EUA). This EUA will remain in effect (meaning this test can be used) for the duration of the COVID-19 declaration under Section 564(b)(1) of the Act, 21 U.S.C. section 360bbb-3(b)(1), unless the authorization is terminated or revoked.  Performed at [facility], [address] [place], [place] [address]   Blood Culture (routine x 2)     Status: None (Preliminary result)   Collection Time: 12/19/24  9:48 PM   Specimen: BLOOD  Result Value Ref Range Status   Specimen Description BLOOD BLOOD  LEFT FOREARM  Final   Special Requests   Final    BOTTLES DRAWN AEROBIC AND ANAEROBIC Blood Culture adequate volume   Culture   Final    NO GROWTH 3 DAYS Performed at [facility], [address] [place], [place] [address]    Report Status PENDING  Incomplete  Aerobic/Anaerobic Culture w Gram Stain (surgical/deep wound)     Status: None (Preliminary result)   Collection Time: 12/21/24 10:45 AM   Specimen: Abdomen  Result Value Ref Range Status   Specimen Description   Final    ABDOMEN Performed at [facility], [address], [place], [place] [address]    Special Requests   Final    NONE Performed at [facility], [address], [place], [place] [address]    Gram Stain NO WBC SEEN FEW GRAM VARIABLE ROD   Final   Culture   Final    FEW PROTEUS MIRABILIS SUSCEPTIBILITIES TO FOLLOW Performed at [facility], [address] [place], [place] [address]    Report Status PENDING  Incomplete    Labs: CBC: Recent Labs  Lab 12/19/24 1815 12/20/24 0608 12/21/24 0553 12/22/24 0531  WBC 14.7* 14.3* 10.1 7.1  NEUTROABS 11.7*  --  7.4 4.5  HGB 15.8* 14.4 14.2 14.4  HCT 47.6* 43.4 42.9 43.6  MCV 98.1 96.4 97.1 96.5  PLT 286 245 255 261   Basic Metabolic Panel: Recent Labs  Lab 12/19/24 1815 12/20/24 0608 12/21/24 0553 12/22/24 0531  NA 137 136 137 136  K 4.0 3.9 3.5 3.9  CL 98 103 104 106  CO2 23 20* 19* 20*  GLUCOSE 193* 114* 80 87  BUN 17 15 19 20   CREATININE 0.84 0.73 0.73 0.79  CALCIUM  9.8 8.8* 9.1 8.8*   Liver Function Tests: Recent Labs  Lab 12/19/24 1815  AST 16  ALT 16  ALKPHOS 107  BILITOT 0.8  PROT 7.0  ALBUMIN 4.4   CBG: Recent Labs  Lab 12/21/24 1152 12/21/24 1630 12/21/24 2108 12/22/24 0745 12/22/24 1131  GLUCAP 174* 167* 138* 99 240*    Discharge time spent:  34 minutes.  Signed: Drue ONEIDA Potter, MD [facility] 12/22/2024 "

## 2024-12-22 NOTE — Discharge Instructions (Signed)
 Dressing procedure/placement/frequency: Wound is cleansed with VASHE and allowed to air dry.  Fresh wound effluent is obtained for cultures.  Aquacel silver hydrofiber applied to abdominal wound.  Covered with foam dressing.  Need Interdry skin fold management to abdominal pannus to manage moisture. Measure and cut length of InterDry to fit in skin folds that have skin breakdown Tuck InterDry fabric into skin folds in a single layer, allow for 2 inches of overhang from skin edges to allow for wicking to occur May remove to bathe; dry area thoroughly and then tuck into affected areas again Do not apply any creams or ointments when using InterDry DO NOT THROW AWAY FOR 5 DAYS unless soiled with stool DO NOT [facility] product, this will inactivate the silver in the material  New sheet of Interdry should be applied after 5 days of use if patient continues to have skin breakdown

## 2024-12-22 NOTE — TOC Initial Note (Signed)
 Transition of Care [facility]) - Initial/Assessment Note    Patient Details  Name: Debra [name] MRN: [id number] Date of Birth: 06-30-1961  Transition of [facility]) CM/SW Contact:    Corean ONEIDA Haddock, RN Phone Number: 12/22/2024, 11:26 AM  Clinical Narrative:                    Admitted qnm:Dzedpd due to abdominal wall cellulitis  Admitted from: husband, daughter, son in law, and roommate  PCP: Trinidad  Current home health/prior home health/DME: Shower seat;Rollator (4 wheels);Electric scooter;Grab bars - tub/shower;Hand held shower head;Adaptive equipment Adaptive Equipment: Reacher   Met with patient, daughter and son in law at bedside.  Therapy recommends BSC.  Patient declines.  Discussed the option of home health for wound care.  Patient in agreement.  CMS Medicare.gov Compare Post Acute Care list reviewed with patient and daughter.  Patient selects [facility].  Accepted in HUB and notified Kelsey with [facility].  Requested MD enter [facility] orders with wound care instructions.  Family to transport at discharge   Patient Goals and CMS Choice            Expected Discharge Plan and Services         Expected Discharge Date: 12/22/24                                    Prior Living Arrangements/Services                       Activities of Daily Living   ADL Screening (condition at time of admission) Independently performs ADLs?: Yes (appropriate for developmental age) Is the patient deaf or have difficulty hearing?: No Does the patient have difficulty seeing, even when wearing glasses/contacts?: No Does the patient have difficulty concentrating, remembering, or making decisions?: No  Permission Sought/Granted                  Emotional Assessment              Admission diagnosis:  Cellulitis of abdominal wall [L03.311] PTSD (post-traumatic stress disorder) [F43.10] Wound infection [T14.8XXA, L08.9] Bipolar disorder, in full remission, most recent  episode mixed [F31.78] Bipolar 1 disorder, mixed, moderate (HCC) [F31.62] Type 2 diabetes mellitus treated with insulin  (HCC) [E11.9, Z79.4] Fever, unspecified fever cause [R50.9] Sepsis due to cellulitis (HCC) [L03.90, A41.9] Patient Active Problem List   Diagnosis Date Noted   Sepsis due to cellulitis (HCC) 12/20/2024   Bipolar 1 disorder (HCC) 12/20/2024   Type 2 diabetes mellitus without complications (HCC) 12/20/2024   Lipoma of neck 09/30/2023   Bereavement 02/17/2022   Allergic reaction 10/10/2020   Essential tremor 10/10/2020   Depression    Anxiety    Seizures (HCC)    Bipolar disorder, in full remission, most recent episode mixed 08/01/2020   Pain due to onychomycosis of toenails of both feet 09/11/2019   Pincer nail deformity 09/11/2019   Bipolar 1 disorder, mixed, moderate (HCC) 08/30/2019   PTSD (post-traumatic stress disorder) 08/30/2019   Tobacco use disorder 08/30/2019   Obstructive sleep apnea 11/18/2018   Parkinsonian tremor (HCC) 11/18/2018   RBD (REM behavioral disorder) 05/19/2018   Morbid obesity (HCC) 05/03/2018   Osteopenia 03/16/2018   Abdominal aortic atherosclerosis 03/04/2018   Adverse reaction to statin medication 03/04/2018   Uncontrolled diabetes mellitus type 2 with atherosclerosis of arteries of extremities 03/04/2018   Hyperlipidemia 04/20/2017  Lumbar disc disease with radiculopathy 08/20/2015   Lumbar herniated disc 08/20/2015   Left-sided low back pain with sciatica 06/03/2015   Bipolar disorder (HCC) 06/03/2015   Insomnia, persistent 06/03/2015   Elevated blood pressure reading in office with diagnosis of hypertension 06/03/2015   Compulsive tobacco user syndrome 06/03/2015   Phlebectasia 06/03/2015   PCP:  [place] Cassondra BROCKS, MD Pharmacy:   [facility] - [place], [place] - [address] [place] [address] Phone: [phone] Fax: [phone]  [id number] [place], [place] - [address]  [address] [place] [address] Phone: [phone] Fax: [phone]     Social Drivers of Health (SDOH) Social History: SDOH Screenings   Food Insecurity: No Food Insecurity (03/01/2024)   Received from [facility]  Housing: Low Risk  (07/18/2024)   Received from [facility]  Transportation Needs: No Transportation Needs (03/01/2024)   Received from [facility]  Utilities: Not At Risk (03/01/2024)   Received from [facility]  Depression [phone]): Low Risk (04/13/2023)  Recent Concern: Depression (PHQ2-9) - High Risk (03/10/2023)  Financial Resource Strain: Medium Risk (03/01/2024)   Received from [facility]  Tobacco Use: High Risk (12/19/2024)   SDOH Interventions:     Readmission Risk Interventions     No data to display

## 2024-12-24 LAB — CULTURE, BLOOD (ROUTINE X 2)
Culture: NO GROWTH
Culture: NO GROWTH
Special Requests: ADEQUATE

## 2024-12-25 ENCOUNTER — Ambulatory Visit: Payer: Medicare (Managed Care) | Admitting: Cardiovascular Disease

## 2024-12-28 LAB — AEROBIC/ANAEROBIC CULTURE W GRAM STAIN (SURGICAL/DEEP WOUND): Gram Stain: NONE SEEN

## 2025-01-05 ENCOUNTER — Encounter: Payer: Self-pay | Admitting: Physician Assistant

## 2025-01-05 ENCOUNTER — Ambulatory Visit: Payer: Medicare (Managed Care) | Attending: Physician Assistant | Admitting: Physician Assistant

## 2025-01-05 VITALS — BP 154/80 | HR 77 | Ht 65.0 in | Wt 296.2 lb

## 2025-01-05 DIAGNOSIS — I10 Essential (primary) hypertension: Secondary | ICD-10-CM

## 2025-01-05 DIAGNOSIS — I2584 Coronary atherosclerosis due to calcified coronary lesion: Secondary | ICD-10-CM | POA: Diagnosis not present

## 2025-01-05 DIAGNOSIS — I2510 Atherosclerotic heart disease of native coronary artery without angina pectoris: Secondary | ICD-10-CM | POA: Diagnosis not present

## 2025-01-05 DIAGNOSIS — E785 Hyperlipidemia, unspecified: Secondary | ICD-10-CM | POA: Diagnosis not present

## 2025-01-05 DIAGNOSIS — I700 Atherosclerosis of aorta: Secondary | ICD-10-CM

## 2025-01-05 MED ORDER — REPATHA SURECLICK 140 MG/ML ~~LOC~~ SOAJ: 140.0000 mg | SUBCUTANEOUS | 3 refills | Status: DC

## 2025-01-05 MED ORDER — LISINOPRIL 40 MG PO TABS: 40.0000 mg | ORAL_TABLET | Freq: Every day | ORAL | 3 refills | Status: AC

## 2025-01-05 NOTE — Progress Notes (Signed)
 "  Cardiology Office Note    Date:  01/05/2025   ID:  Debra [name], DOB 1961/04/14, MRN [id number]  PCP:  Trinidad Cassondra BROCKS, MD  Cardiologist:  Evalene Lunger, MD  Electrophysiologist:  None   Chief Complaint: Follow up  History of Present Illness:   Debra [name] is a 64 y.o. female with history of coronary artery disease, hyperlipidemia intolerant to statin, aortic atherosclerosis, hypertension, OSA on CPAP, anxiety, bipolar 1 disorder, depression, hepatic steatosis, type 2 diabetes, TIA, Parkinson's disease, and tobacco use who presents for follow up on coronary artery calcification and hyperlipidemia.    Patient was initially evaluated by Dr. Gollan in 2020 for palpitations and chest heaviness.  Reportedly did not tolerate statins or ezetimibe  in the past.  CT study from 02/2018 was reviewed which revealed calcification in the abdominal aorta.  She was subsequently started on PCSK9 inhibitor.  She has been continued on propranolol  for tremor and palpitations.  Coronary CTA 01/2021 revealed calcium  score of 171 which was 95th percentile for age and sex matched control.  Calcified plaque was noted in the mid LAD.  However, mid to distal segments of the 3 major coronary arteries could not be analyzed due to morbid obesity related artifacts and study was deemed to be nondiagnostic.   Patient was most recently seen in the cardiology clinic 12/2023 overall feeling well.  Continues to smoke less than half pack per day.  Potassium was noted to be low and supplementation was increased to 20 mill equivalents per day.  No further testing or medication changes were indicated at that time.  Recent hospitalization at [facility] 1/13 for LLQ wound. She was febrile at 100.4 F at presentation with CBC showing leukocytosis of 14.7 with neutrophilia and hemoconcentration.  She received a CTAP without contrast which demonstrated diverticular disease of the left colon without acute inflammatory process, aortic  atherosclerosis with no acute intra-abdominal or pelvic abnormality.  She was admitted for sepsis due to abdominal wall cellulitis and treated with IV cefepime  and Flagyl .  Her wound culture grew gram-negative bacteremia.  She was discharged 12/22/2024.  She then presented to [facility] ED 12/25/2024 with concern for gangrenous/necrotic changes of her wound per her caregiver.  She was afebrile and hemodynamically stable without leukocytosis.  Left pannus wound was found to be necrotic and ulcerated.  Blood cultures x 2 with no growth.  CT abdomen pelvis showed a left lower pannus cellulitis without underlying organized fluid collection.  She had debridement on 1/20.  Wound culture grew PSA resistant to fluoroquinolones, VRE, and staph iugdunesis.  Received 7-day course from source control for PSA and 7-day oral linezolid course for VRE.  Of note, patient had a transient episode of heart fluttering, dizziness, and nausea when standing up and bending over to get socks on.  EKG showed sinus rhythm with no ST changes.  Thought to be brief vagal like event.  Orthostatics negative.  Telemetry showed some PVCs.  Patient was discharged 01/01/2025 in stable condition.  Patient presents today accompanied by her husband. She reports overall feeling better since hospital discharge. Wound care has been coming to her house to change her dressings. From a cardiac perspective, she feels stable. She does recall one episode of palpitations during her hospitalization but believes it was due to one of the IV antibiotics she was taking. She denies recurrence of symptoms. Prior to hospitalization, she was very active around the house. She reports doing all of the cleaning and housework without exertional dyspnea  or chest pain. She denies lightheadedness, dizziness, and lower extremity swelling.   Labs independently reviewed: 12/2024-Hgb 13.9, HCT 40.7, platelets 351, sodium 143, potassium 3.7, BUN 10, creatinine 0.72, A1c 7.8 11/2024-TC 138,  TG 91, HDL 63, LDL 57  Objective   Past Medical History:  Diagnosis Date   Anxiety    Aortic atherosclerosis    Arthritis    Bipolar 1 disorder (HCC)    C. difficile colitis    CAD (coronary artery disease)    a.) cCTA 01/10/2021: Ca2+ 171 (95th percentile for age/sex/race match control)   Childhood asthma    DDD (degenerative disc disease), lumbar    a.) s/p LEFT L4-5 foraminotomy/LEFT L5-S1 discectomy 09/2015   Depression    Family history of adverse reaction to anesthesia    a.) intra/post-operative HYPOtension in 1st degree relative (mother)   Head injury, closed, with brief LOC (HCC) 2011ish   Hepatic steatosis    Hyperlipidemia    a.) on PCSK9i (evolumab) for HLD/ASCVD prevention   Hypertension    Infarction of left basal ganglia (HCC)    a.) CT head 10/21/2016: chronic LEFT basal ganglia lacunar infarct.   Insomnia    a.) on trazodone  PRN   Lipoma of neck (LEFT)    Long term current use of aspirin     Myalgia due to statin 03/29/2018   Neuropathy of both feet    Obesity    OSA on CPAP    Parkinson's disease (HCC)    Pneumonia    PTSD (post-traumatic stress disorder)    RBD (REM behavioral disorder)    Seasonal allergies    Seizures (HCC)    TIA (transient ischemic attack) 2005   Tobacco abuse    Type 2 diabetes mellitus treated with insulin  (HCC)     Current Medications: Active Medications[1]  Allergies:   Clindamycin /lincomycin, Montelukast, Primidone, Sulfasalazine, Metformin and related, Benadryl [diphenhydramine hcl], Canagliflozin , Depakote er [divalproex sodium er], Dilaudid [hydromorphone hcl], Diphenhydramine-zinc acetate, Erythromycin, Ethanol, Haloperidol, Neosporin [neomycin-bacitracin zn-polymyx], Sitagliptin, Statins, Sulfa antibiotics, and Victoza [liraglutide]   Social History   Socioeconomic History   Marital status: Married    Spouse name: Lynwood   Number of children: 1   Years of education: Not on file   Highest education level:  Associate degree: occupational, scientist, product/process development, or vocational program  Occupational History   Occupation: Disabled  Tobacco Use   Smoking status: Some Days    Current packs/day: 0.50    Average packs/day: 0.5 packs/day for 25.3 years (12.7 ttl pk-yrs)    Types: Cigarettes    Start date: 03/19/2004    Passive exposure: Past   Smokeless tobacco: Never  Vaping Use   Vaping status: Never Used  Substance and Sexual Activity   Alcohol use: Not Currently    Comment: occasional- rare   Drug use: No   Sexual activity: Not Currently  Other Topics Concern   Not on file  Social History Narrative   Live in private residence w/ husband and their friend who is wheelchair bound, her end of the house is wheelchair accessible.    Social Drivers of Health   Tobacco Use: High Risk (01/05/2025)   Patient History    Smoking Tobacco Use: Some Days    Smokeless Tobacco Use: Never    Passive Exposure: Past  Financial Resource Strain: Low Risk (12/27/2024)   Received from [facility]   Overall Financial Resource Strain (CARDIA)    How hard is it for you to pay for the very basics  like food, housing, medical care, and heating?: Not hard at all  Food Insecurity: No Food Insecurity (12/27/2024)   Received from [facility]   Epic    Within the past 12 months, you worried that your food would run out before you got the money to buy more.: Never true    Within the past 12 months, the food you bought just didn't last and you didn't have money to get more.: Never true  Transportation Needs: No Transportation Needs (12/27/2024)   Received from [facility] - Transportation    Lack of Transportation (Medical): No    Lack of Transportation (Non-Medical): No  Physical Activity: Not on file  Stress: Not on file  Social Connections: Not on file  Depression (PHQ2-9): Low Risk (04/13/2023)   Depression (PHQ2-9)    PHQ-2 Score: 0  Recent Concern: Depression (PHQ2-9) - High Risk (03/10/2023)    Depression (PHQ2-9)    PHQ-2 Score: 20  Alcohol Screen: Not on file  Housing: Low Risk  (07/18/2024)   Received from [facility]   Epic    In the last 12 months, was there a time when you were not able to pay the mortgage or rent on time?: No    In the past 12 months, how many times have you moved where you were living?: 0    At any time in the past 12 months, were you homeless or living in a shelter (including now)?: No  Utilities: Not At Risk (03/01/2024)   Received from [facility] Utilities    Threatened with loss of utilities: No  Health Literacy: Not on file     Family History:  The patient's family history includes Bipolar disorder in her brother, brother, and father; COPD in her mother; Diabetes Mellitus II in her brother and father. There is no history of Breast cancer.  ROS:   12-point review of systems is negative unless otherwise noted in the HPI.  EKGs/Other Studies Reviewed:    Studies reviewed were summarized above. The additional studies were reviewed today:  01/2021 Coronary CTA 1. Coronary calcium  score of 171. This was 95th percentile for age and sex matched control.   2. Normal coronary origin with right dominance.   3. No evidence of disease noted in the proximal LM or proximal coronary arteries   4. Calcified plaque noted in the mid LAD   5. Mid to distal segments of all 3 major coronary arteries could not be analyzed due to morbid obesity related artifacts.   6. CAD-RADS N Non-diagnostic study. Obstructive CAD can't be excluded. Alternative evaluation is recommended.   PHYSICAL EXAM:    VS:  BP (!) 154/80   Pulse 77   Ht 5' 5 (1.651 m)   Wt 296 lb 3.2 oz (134.4 kg)   SpO2 98%   BMI 49.29 kg/m   BMI: Body mass index is 49.29 kg/m.  GEN: Well nourished, well developed in no acute distress NECK: No JVD; No carotid bruits CARDIAC: RRR, no murmurs, rubs, gallops RESPIRATORY:  Clear to auscultation  without rales, wheezing or rhonchi  ABDOMEN: Soft, non-tender, non-distended EXTREMITIES: No edema; No deformity  Wt Readings from Last 3 Encounters:  01/05/25 296 lb 3.2 oz (134.4 kg)  12/19/24 286 lb (129.7 kg)  12/15/23 296 lb (134.3 kg)                  ASSESSMENT & PLAN:   Coronary  artery calcification Aortic atherosclerosis Hyperlipidemia - Coronary CTA 01/2021 with calcium  score of 171 with calcified plaque noted in the mid LAD. However, study was limited by artifact secondary to body habitus. No symptoms of angina or cardiac decompensation. No further testing indicated at this time. Most recent lipid panel 11/2024 with LDL 57, at goal. Continue aspirin  and Repatha .   Essential hypertension - BP elevated in office today. Patient has not been able to get a refill of her lisinopril  since being discharged from the hospital. Recommend resuming lisinopril  40 mg daily. Continue Lasix  20 mg daily. She will monitor BP at home and bring readings to follow up with her PCP next month.     Disposition: Resume lisinopril  40 mg daily. Refills sent for Repatha . F/u with Dr. Gollan or an APP in 1 year.   Medication Adjustments/Labs and Tests Ordered: Current medicines are reviewed at length with the patient today.  Concerns regarding medicines are outlined above. Medication changes, Labs and Tests ordered today are summarized above and listed in the Patient Instructions accessible in Encounters.   Bonney Lesley Maffucci, PA-C 01/05/2025 12:44 PM     [facility] [place] [address] [place], [place] [address] [phone]      [1]  Current Meds  Medication Sig   acetaminophen  (TYLENOL ) 500 MG tablet Take 2 tablets (1,000 mg total) by mouth every 6 (six) hours as needed for mild pain (pain score 1-3).   aspirin  EC 81 MG tablet Take 81 mg by mouth daily.   clonazePAM  (KLONOPIN ) 0.5 MG tablet Take 1 tablet (0.5 mg total) by mouth as directed. Take one  tablet once a day as needed for severe anxiety   Continuous Glucose Sensor (DEXCOM G7 SENSOR) MISC USE TO CHECK BLOOD SUGAR DAILY, REPLACE EVERY 10 DAYS   furosemide  (LASIX ) 20 MG tablet Take 20 mg by mouth daily.   HUMALOG  KWIKPEN 100 UNIT/ML KwikPen INJECT 20 UNITS UNDER THE SKIN IN THE MORNING AND 35 UNITS WITH LUNCH AND EVENING MEAL (INJECT WITH MEALS) (Patient taking differently: INJECT none UNITS UNDER THE SKIN IN THE MORNING AND 20 UNITS WITH LUNCH AND EVENING MEAL (INJECT WITH MEALS))   Insulin  Pen Needle 29G X 12.7MM MISC 1 each by Does not apply route as needed.   ipratropium (ATROVENT ) 0.03 % nasal spray Place 1 spray into both nostrils 2 (two) times daily.   JARDIANCE  25 MG TABS tablet Take 25 mg by mouth daily.   lamoTRIgine  (LAMICTAL ) 100 MG tablet Take 1 tablet (100 mg total) by mouth 2 (two) times daily. No refills with out an appointment   lisinopril  (ZESTRIL ) 40 MG tablet Take 1 tablet (40 mg total) by mouth daily.   mometasone (ELOCON) 0.1 % lotion Apply topically.   NON FORMULARY 10 mg at bedtime. Natrol 10 mg   nystatin  cream (MYCOSTATIN ) Apply 1 Application topically 2 (two) times daily.   PARoxetine  (PAXIL ) 40 MG tablet TAKE ONE TABLET BY MOUTH EVERY MORNING   pioglitazone  (ACTOS ) 30 MG tablet TAKE 1 TABLET DAILY   potassium chloride  20 MEQ TBCR Take 1 tablet (20 mEq total) by mouth daily.   TRESIBA  FLEXTOUCH 200 UNIT/ML SOPN Inject 120 Units into the skin at bedtime. (Patient taking differently: Inject 140 Units into the skin every morning. In the morning)   [DISCONTINUED] Evolocumab  (REPATHA  SURECLICK) 140 MG/ML SOAJ INJECT 140 MG INTO THE SKIN EVERY 14 (FOURTEEN) DAYS.   [DISCONTINUED] lisinopril  (ZESTRIL ) 20 MG tablet Take 20 mg by mouth  daily.   "

## 2025-01-09 ENCOUNTER — Telehealth: Payer: Self-pay | Admitting: Cardiovascular Disease

## 2025-01-09 NOTE — Telephone Encounter (Signed)
 Pt reaching out to notify insurance needing PA for Repatha 

## 2025-01-10 ENCOUNTER — Other Ambulatory Visit (HOSPITAL_COMMUNITY): Payer: Self-pay

## 2025-01-10 ENCOUNTER — Telehealth: Payer: Self-pay | Admitting: Pharmacy Technician

## 2025-01-10 DIAGNOSIS — I700 Atherosclerosis of aorta: Secondary | ICD-10-CM

## 2025-01-10 DIAGNOSIS — I2510 Atherosclerotic heart disease of native coronary artery without angina pectoris: Secondary | ICD-10-CM

## 2025-01-10 DIAGNOSIS — E785 Hyperlipidemia, unspecified: Secondary | ICD-10-CM

## 2025-01-10 NOTE — Telephone Encounter (Signed)
 Pharmacy Patient Advocate Encounter  Received notification from [employer] that Prior Authorization for Repatha  has been APPROVED from 12/11/24 to 01/10/26   PA #/Case ID/Reference #: [id number]

## 2025-01-10 NOTE — Telephone Encounter (Signed)
 Pharmacy Patient Advocate Encounter   Received notification from Pt Calls Messages that prior authorization for Repatha  is required/requested.   Insurance verification completed.   The patient is insured through [employer].   Per test claim: PA required; PA submitted to above mentioned insurance via Latent Key/confirmation #/EOC [id number] Status is pending

## 2025-01-11 ENCOUNTER — Other Ambulatory Visit: Payer: Self-pay | Admitting: Cardiovascular Disease

## 2025-01-12 MED ORDER — REPATHA SURECLICK 140 MG/ML ~~LOC~~ SOAJ: 140.0000 mg | SUBCUTANEOUS | 1 refills | Status: AC

## 2025-01-12 NOTE — Addendum Note (Signed)
 Addended by: DARRELL BAREFOOT on: 01/12/2025 07:47 AM   Modules accepted: Orders
